# Patient Record
Sex: Female | Born: 1950 | Race: Black or African American | Hispanic: No | Marital: Married | Smoking: Former smoker
Health system: Southern US, Community
[De-identification: ages and names within clinical notes are randomized; demographics above are authoritative.]

## PROBLEM LIST (undated history)

## (undated) DIAGNOSIS — I480 Paroxysmal atrial fibrillation: Secondary | ICD-10-CM

## (undated) DIAGNOSIS — E538 Deficiency of other specified B group vitamins: Secondary | ICD-10-CM

## (undated) DIAGNOSIS — D649 Anemia, unspecified: Secondary | ICD-10-CM

## (undated) DIAGNOSIS — I771 Stricture of artery: Secondary | ICD-10-CM

## (undated) DIAGNOSIS — I2699 Other pulmonary embolism without acute cor pulmonale: Secondary | ICD-10-CM

## (undated) DIAGNOSIS — E039 Hypothyroidism, unspecified: Secondary | ICD-10-CM

## (undated) DIAGNOSIS — I10 Essential (primary) hypertension: Secondary | ICD-10-CM

## (undated) DIAGNOSIS — R7303 Prediabetes: Secondary | ICD-10-CM

## (undated) DIAGNOSIS — E785 Hyperlipidemia, unspecified: Secondary | ICD-10-CM

## (undated) DIAGNOSIS — I2510 Atherosclerotic heart disease of native coronary artery without angina pectoris: Secondary | ICD-10-CM

## (undated) DIAGNOSIS — I779 Disorder of arteries and arterioles, unspecified: Secondary | ICD-10-CM

## (undated) HISTORY — DX: Hyperlipidemia, unspecified: E78.5

## (undated) HISTORY — PX: SHOULDER SURGERY: SHX246

## (undated) HISTORY — DX: Deficiency of other specified B group vitamins: E53.8

## (undated) HISTORY — DX: Other pulmonary embolism without acute cor pulmonale: I26.99

## (undated) HISTORY — DX: Prediabetes: R73.03

## (undated) HISTORY — DX: Disorder of arteries and arterioles, unspecified: I77.9

## (undated) HISTORY — PX: ABDOMINAL HYSTERECTOMY: SHX81

## (undated) HISTORY — DX: Anemia, unspecified: D64.9

## (undated) HISTORY — DX: Essential (primary) hypertension: I10

## (undated) HISTORY — DX: Stricture of artery: I77.1

## (undated) HISTORY — DX: Atherosclerotic heart disease of native coronary artery without angina pectoris: I25.10

## (undated) HISTORY — DX: Hypothyroidism, unspecified: E03.9

## (undated) HISTORY — DX: Paroxysmal atrial fibrillation: I48.0

## (undated) HISTORY — PX: TONSILLECTOMY: SUR1361

## (undated) HISTORY — PX: TUBAL LIGATION: SHX77

---

## 1998-10-28 ENCOUNTER — Ambulatory Visit (HOSPITAL_COMMUNITY): Admission: RE | Admit: 1998-10-28 | Discharge: 1998-10-28 | Payer: Self-pay | Admitting: Orthopedic Surgery

## 1998-10-28 ENCOUNTER — Encounter: Payer: Self-pay | Admitting: Orthopedic Surgery

## 1998-11-17 ENCOUNTER — Encounter: Payer: Self-pay | Admitting: Orthopedic Surgery

## 1998-11-18 ENCOUNTER — Observation Stay (HOSPITAL_COMMUNITY): Admission: RE | Admit: 1998-11-18 | Discharge: 1998-11-19 | Payer: Self-pay | Admitting: Orthopedic Surgery

## 1999-03-10 ENCOUNTER — Ambulatory Visit (HOSPITAL_COMMUNITY): Admission: RE | Admit: 1999-03-10 | Discharge: 1999-03-10 | Payer: Self-pay | Admitting: Gastroenterology

## 1999-12-01 ENCOUNTER — Other Ambulatory Visit: Admission: RE | Admit: 1999-12-01 | Discharge: 1999-12-01 | Payer: Self-pay | Admitting: *Deleted

## 1999-12-23 ENCOUNTER — Emergency Department (HOSPITAL_COMMUNITY): Admission: EM | Admit: 1999-12-23 | Discharge: 1999-12-23 | Payer: Self-pay | Admitting: *Deleted

## 2001-01-30 ENCOUNTER — Encounter: Admission: RE | Admit: 2001-01-30 | Discharge: 2001-01-30 | Payer: Self-pay | Admitting: Family Medicine

## 2001-01-30 ENCOUNTER — Encounter: Payer: Self-pay | Admitting: Family Medicine

## 2001-04-05 ENCOUNTER — Other Ambulatory Visit: Admission: RE | Admit: 2001-04-05 | Discharge: 2001-04-05 | Payer: Self-pay | Admitting: Obstetrics and Gynecology

## 2001-04-11 ENCOUNTER — Encounter: Admission: RE | Admit: 2001-04-11 | Discharge: 2001-04-11 | Payer: Self-pay | Admitting: Obstetrics and Gynecology

## 2001-04-11 ENCOUNTER — Encounter: Payer: Self-pay | Admitting: Obstetrics and Gynecology

## 2001-09-17 ENCOUNTER — Ambulatory Visit (HOSPITAL_COMMUNITY): Admission: RE | Admit: 2001-09-17 | Discharge: 2001-09-17 | Payer: Self-pay | Admitting: Family Medicine

## 2002-02-24 ENCOUNTER — Encounter: Payer: Self-pay | Admitting: Emergency Medicine

## 2002-02-24 ENCOUNTER — Emergency Department (HOSPITAL_COMMUNITY): Admission: EM | Admit: 2002-02-24 | Discharge: 2002-02-24 | Payer: Self-pay | Admitting: Emergency Medicine

## 2002-03-31 ENCOUNTER — Emergency Department (HOSPITAL_COMMUNITY): Admission: EM | Admit: 2002-03-31 | Discharge: 2002-03-31 | Payer: Self-pay | Admitting: Emergency Medicine

## 2002-07-24 ENCOUNTER — Encounter: Payer: Self-pay | Admitting: Family Medicine

## 2002-07-24 ENCOUNTER — Encounter: Admission: RE | Admit: 2002-07-24 | Discharge: 2002-07-24 | Payer: Self-pay | Admitting: Family Medicine

## 2002-10-16 ENCOUNTER — Encounter: Payer: Self-pay | Admitting: Orthopedic Surgery

## 2002-10-16 ENCOUNTER — Ambulatory Visit (HOSPITAL_COMMUNITY): Admission: RE | Admit: 2002-10-16 | Discharge: 2002-10-16 | Payer: Self-pay | Admitting: Orthopedic Surgery

## 2003-11-10 ENCOUNTER — Other Ambulatory Visit: Admission: RE | Admit: 2003-11-10 | Discharge: 2003-11-10 | Payer: Self-pay | Admitting: Obstetrics & Gynecology

## 2003-12-08 ENCOUNTER — Encounter: Admission: RE | Admit: 2003-12-08 | Discharge: 2003-12-08 | Payer: Self-pay | Admitting: Obstetrics and Gynecology

## 2004-10-01 ENCOUNTER — Ambulatory Visit: Payer: Self-pay | Admitting: Oncology

## 2010-11-02 ENCOUNTER — Other Ambulatory Visit: Payer: Self-pay | Admitting: Family Medicine

## 2010-11-02 DIAGNOSIS — M25561 Pain in right knee: Secondary | ICD-10-CM

## 2010-11-05 ENCOUNTER — Ambulatory Visit
Admission: RE | Admit: 2010-11-05 | Discharge: 2010-11-05 | Disposition: A | Discharge status: Home / Self Care | Payer: 59 | Source: Ambulatory Visit | Attending: Family Medicine | Admitting: Family Medicine

## 2010-11-05 DIAGNOSIS — M25561 Pain in right knee: Secondary | ICD-10-CM

## 2012-05-30 ENCOUNTER — Encounter: Payer: Self-pay | Admitting: Emergency Medicine

## 2012-07-03 ENCOUNTER — Other Ambulatory Visit: Payer: Self-pay | Admitting: Emergency Medicine

## 2012-07-03 NOTE — Telephone Encounter (Signed)
Please pull chart.

## 2012-07-11 ENCOUNTER — Telehealth: Payer: Self-pay

## 2012-07-11 NOTE — Telephone Encounter (Signed)
Pt states that she would like a refill on levothyroxine, pt states that she is completely out of this medication and Dr.Ganji told her to call us to refill this rx. Best# [phone]

## 2012-07-11 NOTE — Telephone Encounter (Signed)
She needs to have pharmacy send [place] the request, she needs appt. Can send in 1 mo supply until she can follow up. She is contacting the pharmacy now, so they can send to [place]. Marland Kitchen

## 2012-07-12 MED ORDER — LEVOTHYROXINE SODIUM 100 MCG PO TABS: 100.0000 ug | ORAL_TABLET | Freq: Every day | ORAL | Status: DC

## 2012-07-12 NOTE — Telephone Encounter (Signed)
I have not heard back from her pharmacy so I called it in, at previous dose given.

## 2012-08-12 ENCOUNTER — Ambulatory Visit (INDEPENDENT_AMBULATORY_CARE_PROVIDER_SITE_OTHER): Payer: 59 | Admitting: Family Medicine

## 2012-08-12 VITALS — BP 172/108 | HR 86 | Temp 98.9°F | Resp 20 | Ht 61.0 in | Wt 240.2 lb

## 2012-08-12 DIAGNOSIS — E889 Metabolic disorder, unspecified: Secondary | ICD-10-CM

## 2012-08-12 DIAGNOSIS — R21 Rash and other nonspecific skin eruption: Secondary | ICD-10-CM

## 2012-08-12 DIAGNOSIS — E039 Hypothyroidism, unspecified: Secondary | ICD-10-CM

## 2012-08-12 DIAGNOSIS — I10 Essential (primary) hypertension: Secondary | ICD-10-CM

## 2012-08-12 MED ORDER — TRIAMCINOLONE ACETONIDE 0.1 % EX CREA: TOPICAL_CREAM | Freq: Two times a day (BID) | CUTANEOUS | Status: DC

## 2012-08-12 MED ORDER — LEVOTHYROXINE SODIUM 100 MCG PO TABS: 100.0000 ug | ORAL_TABLET | Freq: Every day | ORAL | Status: DC

## 2012-08-12 MED ORDER — LABETALOL HCL 200 MG PO TABS: 200.0000 mg | ORAL_TABLET | Freq: Every day | ORAL | Status: DC

## 2012-08-12 MED ORDER — OLMESARTAN-AMLODIPINE-HCTZ 40-10-25 MG PO TABS: 1.0000 | ORAL_TABLET | Freq: Every day | ORAL | Status: DC

## 2012-08-12 NOTE — Progress Notes (Signed)
[facility]:  Office Visit  Chief Complaint:  Chief Complaint  Patient presents with  . Cyst    back of neck x 1 month  . Rash    R arm  . Recurrent Skin Infections    R breast x 1 month  . Medication Refill    HPI: Anna Bernard is a 61 y.o. female who complains of  1. HTN-no SEs, compliant but ran out f her Tribenzor 4 days ago. Nonsymptomatic, no HA, No vision changes. 2. Rash on left arm- 3 month duration, tried Hydrocortisone and has decrease in size , non itching, getting better, no new meds, no chemical exposure 3. She has a lesion on back and also on breast that has been there for several months. Thinks an insect bit her on her neck and then she had a cyst which she picked at. Now has slighlty hard/nontender nodules at those sites. She denies having a h/o keloids.   She is supposed to get labs for Dr. Jacinto Halim on Dec 9th.    Past Medical History  Diagnosis Date  . Hypertension   . Thyroid disease    Past Surgical History  Procedure Date  . Abdominal hysterectomy    History   Social History  . Marital Status: Single    Spouse Name: N/A    Number of Children: N/A  . Years of Education: N/A   Social History Main Topics  . Smoking status: Never Smoker   . Smokeless tobacco: None  . Alcohol Use: None  . Drug Use: None  . Sexually Active: None   Other Topics Concern  . None   Social History Narrative  . None   No family history on file. No Known Allergies Prior to Admission medications   Medication Sig Start Date End Date Taking? Authorizing Provider  Fiber-Vitamins-Minerals (FIBERALL) POWD Take 2 scoop by mouth daily.   Yes Historical Provider, MD  labetalol (NORMODYNE) 200 MG tablet Take 200 mg by mouth daily.   Yes Historical Provider, MD  levothyroxine (SYNTHROID, LEVOTHROID) 100 MCG tablet Take 1 tablet (100 mcg total) by mouth daily. 07/12/12  Yes Heather Jaquita Rector, PA-C  Multiple Vitamin (MULTIVITAMIN WITH MINERALS) TABS Take 1  tablet by mouth daily.   Yes Historical Provider, MD  Olmesartan-Amlodipine-HCTZ (TRIBENZOR) 40-10-25 MG TABS Take by mouth daily.   Yes Historical Provider, MD     ROS: The patient denies fevers, chills, night sweats, unintentional weight loss, chest pain, palpitations, wheezing, dyspnea on exertion, nausea, vomiting, abdominal pain, dysuria, hematuria, melena, numbness, weakness, or tingling.   All other systems have been reviewed and were otherwise negative with the exception of those mentioned in the HPI and as above.    PHYSICAL EXAM: Filed Vitals:   08/12/12 1227  BP: 172/108  Pulse: 86  Temp: 98.9 F (37.2 C)  Resp: 20   Filed Vitals:   08/12/12 1227  Height: 5\' 1"  (1.549 m)  Weight: 240 lb 3.2 oz (108.954 kg)   Body mass index is 45.39 kg/(m^2).  General: Alert, no acute distress HEENT:  Normocephalic, atraumatic, oropharynx patent.  Cardiovascular:  Regular rate and rhythm, no rubs murmurs or gallops.  No Carotid bruits, radial pulse intact. No pedal edema.  Respiratory: Clear to auscultation bilaterally.  No wheezes, rales, or rhonchi.  No cyanosis, no use of accessory musculature GI: No organomegaly, abdomen is soft and non-tender, positive bowel sounds.  No masses. Skin:  + right arm rash hyperpigmented, nonpruritic 5-10 mm  maculopapular lesions on forearm Neurologic: Facial musculature symmetric. Psychiatric: Patient is appropriate throughout our interaction. Lymphatic: No cervical lymphadenopathy Musculoskeletal: Gait intact.   LABS: No results found for this or any previous visit.   EKG/XRAY:   Primary read interpreted by Dr. Conley Rolls at [facility].   ASSESSMENT/PLAN: Encounter Diagnoses  Name Primary?  . HTN (hypertension) Yes  . Hypothyroid   . Rash    Will refill meds x 6 months, Patient advise to take BP after taking meds, if her BP is consistenly above 140/90 then need f/u sooner. Advise to take medicine as soon as possible. She is currently asymptomatic,  is driving herself so I will not give her any clonodine.  She will get BMP and TSH when she goes to [facility] to get labs for Dr. Jacinto Halim on Dec 9th, gave her a rx Rash on neck and breast most likely skin changes from injury  ( granulation, fiber remodeling and contraction), she has had breast mammogram normal Rash on hand ? allergic dermatitis vs pityriasis rosea but not on back, improving, will give triamcinolone    Marnisha Stampley PHUONG, DO 08/12/2012 1:24 PM

## 2012-08-27 LAB — BASIC METABOLIC PANEL
BUN: 12 mg/dL (ref 4–21)
Creatinine: 0.6 mg/dL (ref 0.5–1.1)
Glucose: 133 mg/dL
Potassium: 4 mmol/L (ref 3.4–5.3)
Sodium: 139 mmol/L (ref 137–147)

## 2012-08-27 LAB — TSH: TSH: 2.32 u[IU]/mL (ref 0.41–5.90)

## 2012-12-08 ENCOUNTER — Encounter: Payer: Self-pay | Admitting: *Deleted

## 2013-02-16 ENCOUNTER — Other Ambulatory Visit: Payer: Self-pay | Admitting: Family Medicine

## 2013-02-18 NOTE — Telephone Encounter (Signed)
Due for OV, labs 

## 2013-04-21 ENCOUNTER — Ambulatory Visit (INDEPENDENT_AMBULATORY_CARE_PROVIDER_SITE_OTHER): Payer: 59 | Admitting: Family Medicine

## 2013-04-21 VITALS — BP 142/82 | HR 84 | Temp 98.1°F | Resp 16 | Ht 61.0 in | Wt 240.0 lb

## 2013-04-21 DIAGNOSIS — K047 Periapical abscess without sinus: Secondary | ICD-10-CM

## 2013-04-21 DIAGNOSIS — I10 Essential (primary) hypertension: Secondary | ICD-10-CM

## 2013-04-21 DIAGNOSIS — E785 Hyperlipidemia, unspecified: Secondary | ICD-10-CM

## 2013-04-21 DIAGNOSIS — E039 Hypothyroidism, unspecified: Secondary | ICD-10-CM

## 2013-04-21 MED ORDER — ROSUVASTATIN CALCIUM 10 MG PO TABS: 10.0000 mg | ORAL_TABLET | Freq: Every day | ORAL | Status: DC

## 2013-04-21 MED ORDER — AMOXICILLIN 500 MG PO TABS: 500.0000 mg | ORAL_TABLET | Freq: Three times a day (TID) | ORAL | Status: DC

## 2013-04-21 MED ORDER — LEVOTHYROXINE SODIUM 100 MCG PO TABS: 100.0000 ug | ORAL_TABLET | Freq: Every day | ORAL | Status: DC

## 2013-04-21 MED ORDER — OLMESARTAN-AMLODIPINE-HCTZ 40-10-25 MG PO TABS: 1.0000 | ORAL_TABLET | Freq: Every day | ORAL | Status: DC

## 2013-04-21 NOTE — Progress Notes (Signed)
[facility]:  Office Visit  Chief Complaint:  Chief Complaint  Patient presents with  . Medication Refill  . Mouth Lesions    since yesterday    HPI: Anna Bernard is a 62 y.o. female who complains of :  1. She is here for HTN recheck. She is supposed to be on Labetalol and also TriBenzor  But the labetalol makes her dizzy so she stopped. She told Dr. Jacinto Halim who is her cardiologist this. Her BP at home is 150-160/90 and it is high. Since she stopped the labetalol she has not had any SEs 2. She is also taking her thyroid medicine byitself and 2 hrs before any other meds, on empty stomach with just water. Denies dry depression, dry skin, hair changes.  3. She also has had left jaw and mouth pain on Monday and then last night it flared up. She took Aleve, ambusol around gums and helped some. She is trying to see a dentist as soon as possible. Denies F/c/n/v, has not eaten due to pain 4. She takes her crestor every other day due to msk ache,  able to tolerate it but she does not like going to work with it.   She takes her asa 79  She does take Aleve BID  Past Medical History  Diagnosis Date  . Hypertension   . Thyroid disease    Past Surgical History  Procedure Laterality Date  . Abdominal hysterectomy     History   Social History  . Marital Status: Single    Spouse Name: N/A    Number of Children: N/A  . Years of Education: N/A   Social History Main Topics  . Smoking status: Never Smoker   . Smokeless tobacco: None  . Alcohol Use: None  . Drug Use: None  . Sexually Active: None   Other Topics Concern  . None   Social History Narrative  . None   No family history on file. No Known Allergies Prior to Admission medications   Medication Sig Start Date End Date Taking? Authorizing Provider  aspirin 81 MG tablet Take 81 mg by mouth daily.   Yes Historical Provider, MD  Fiber-Vitamins-Minerals (FIBERALL) POWD Take 2 scoop by mouth daily.   Yes  Historical Provider, MD  levothyroxine (SYNTHROID, LEVOTHROID) 100 MCG tablet Take 1 tablet (100 mcg total) by mouth daily before breakfast. NEED VISIT, LABS! 02/16/13  Yes Godfrey Pick, PA-C  Multiple Vitamin (MULTIVITAMIN WITH MINERALS) TABS Take 1 tablet by mouth daily.   Yes Historical Provider, MD  Olmesartan-Amlodipine-HCTZ (TRIBENZOR) 40-10-25 MG TABS Take 1 tablet by mouth daily. 08/12/12  Yes Thao P Le, DO  rosuvastatin (CRESTOR) 10 MG tablet Take 10 mg by mouth daily.   Yes Historical Provider, MD  triamcinolone cream (KENALOG) 0.1 % Apply topically 2 (two) times daily. 08/12/12  Yes Thao P Le, DO  labetalol (NORMODYNE) 200 MG tablet Take 1 tablet (200 mg total) by mouth daily. 08/12/12   Thao P Le, DO     ROS: The patient denies fevers, chills, night sweats, unintentional weight loss, chest pain, palpitations, wheezing, dyspnea on exertion, nausea, vomiting, abdominal pain, dysuria, hematuria, melena, numbness, weakness, or tingling.   All other systems have been reviewed and were otherwise negative with the exception of those mentioned in the HPI and as above.    PHYSICAL EXAM: Filed Vitals:   04/21/13 1430  BP: 142/82  Pulse: 84  Temp: 98.1 F (36.7 C)  Resp: 16  Filed Vitals:   04/21/13 1430  Height: 5\' 1"  (1.549 m)  Weight: 240 lb (108.863 kg)   Body mass index is 45.37 kg/(m^2).  General: Alert, no acute distress HEENT:  Normocephalic, atraumatic, oropharynx patent. + mild left facial/llower jaw  Swelling + erythematous gingiva and dental cavity Cardiovascular:  Regular rate and rhythm, no rubs murmurs or gallops.  No Carotid bruits, radial pulse intact. No pedal edema.  Respiratory: Clear to auscultation bilaterally.  No wheezes, rales, or rhonchi.  No cyanosis, no use of accessory musculature GI: No organomegaly, abdomen is soft and non-tender, positive bowel sounds.  No masses. Skin: No rashes. Neurologic: Facial musculature symmetric. Psychiatric: Patient  is appropriate throughout our interaction. Lymphatic: No cervical lymphadenopathy Musculoskeletal: Gait intact.   LABS: Results for orders placed in visit on 12/08/12  BASIC METABOLIC PANEL      Result Value Range   Glucose 133     BUN 12  4 - 21 mg/dL   Creatinine 0.6  0.5 - 1.1 mg/dL   Potassium 4.0  3.4 - 5.3 mmol/L   Sodium 139  137 - 147 mmol/L  TSH      Result Value Range   TSH 2.32  0.41 - 5.90 uIU/mL     EKG/XRAY:   Primary read interpreted by [facility]. Conley Rolls at [facility].   ASSESSMENT/PLAN: Encounter Diagnoses  Name Primary?  . HTN (hypertension) Yes  . Hyperlipidemia   . Hypothyroid   . Tooth abscess    Refilled chronic meds RX amoxacillin for tooth abscess until she is able to see dentist F/u labs and also send labs to Dr. Jacinto Halim She is semicompliant with HTN and Hyperlipidemia meds Recommend fish oil otc if lipid labs are abnormal F/u in 5 1/2 months    LE, [name], DO 04/21/2013 4:08 PM

## 2013-04-22 LAB — MICROALBUMIN / CREATININE URINE RATIO
Creatinine, Urine: 58.9 mg/dL
Microalb Creat Ratio: 84.6 mg/g — ABNORMAL HIGH (ref 0.0–30.0)
Microalb, Ur: 4.98 mg/dL — ABNORMAL HIGH (ref 0.00–1.89)

## 2013-04-23 LAB — LIPID PANEL
Cholesterol: 179 mg/dL (ref 0–200)
HDL: 58 mg/dL (ref >39)
LDL Cholesterol: 95 mg/dL (ref 0–99)
Total CHOL/HDL Ratio: 3.1 Ratio
Triglycerides: 130 mg/dL (ref <150)
VLDL: 26 mg/dL (ref 0–40)

## 2013-04-23 LAB — COMPREHENSIVE METABOLIC PANEL
ALT: 20 U/L (ref 0–35)
AST: 22 U/L (ref 0–37)
Albumin: 4.3 g/dL (ref 3.5–5.2)
Alkaline Phosphatase: 88 U/L (ref 39–117)
BUN: 14 mg/dL (ref 6–23)
CO2: 28 mEq/L (ref 19–32)
Calcium: 9.4 mg/dL (ref 8.4–10.5)
Chloride: 102 mEq/L (ref 96–112)
Creat: 0.52 mg/dL (ref 0.50–1.10)
Glucose, Bld: 109 mg/dL — ABNORMAL HIGH (ref 70–99)
Potassium: 3.9 mEq/L (ref 3.5–5.3)
Sodium: 140 mEq/L (ref 135–145)
Total Bilirubin: 0.4 mg/dL (ref 0.3–1.2)
Total Protein: 7.7 g/dL (ref 6.0–8.3)

## 2013-04-23 LAB — TSH: TSH: 2.961 u[IU]/mL (ref 0.350–4.500)

## 2013-08-18 ENCOUNTER — Ambulatory Visit (INDEPENDENT_AMBULATORY_CARE_PROVIDER_SITE_OTHER): Payer: 59 | Admitting: Internal Medicine

## 2013-08-18 VITALS — BP 130/70 | HR 92 | Temp 98.2°F | Resp 20 | Ht 61.0 in | Wt 234.4 lb

## 2013-08-18 DIAGNOSIS — K047 Periapical abscess without sinus: Secondary | ICD-10-CM

## 2013-08-18 MED ORDER — AMOXICILLIN 500 MG PO CAPS: 1000.0000 mg | ORAL_CAPSULE | Freq: Two times a day (BID) | ORAL | Status: AC

## 2013-08-18 MED ORDER — HYDROCODONE-ACETAMINOPHEN 5-325 MG PO TABS: 1.0000 | ORAL_TABLET | Freq: Four times a day (QID) | ORAL | Status: DC | PRN

## 2013-08-18 NOTE — Progress Notes (Signed)
Subjective:    Patient ID: Anna Bernard, female    DOB: June 22, 1951, 62 y.o.   MRN: [id number]  This chart was scribed for Anna Sia, MD by Greggory Stallion, Medical Scribe. This patient's care was started at 2:57 PM.  HPI HPI Comments: [name] is a 62 y.o. female who presents to the office complaining of gradual onset, constant left lower dental pain due to an abscess. She states the abscess is recurrent. Pt is going to call a dentist tomorrow to set up an appointment. Denies fever.   There are no active problems to display for this patient.  Past Medical History  Diagnosis Date  . Hypertension   . Thyroid disease    Past Surgical History  Procedure Laterality Date  . Abdominal hysterectomy    . Shoulder surgery     No Known Allergies Prior to Admission medications   Medication Sig Start Date End Date Taking? Authorizing Provider  aspirin 81 MG tablet Take 81 mg by mouth daily.   Yes Historical Provider, MD  Fiber-Vitamins-Minerals (FIBERALL) POWD Take 2 scoop by mouth daily.   Yes Historical Provider, MD  levothyroxine (SYNTHROID, LEVOTHROID) 100 MCG tablet Take 1 tablet (100 mcg total) by mouth daily before breakfast. 04/21/13  Yes Thao P Le, DO  Multiple Vitamin (MULTIVITAMIN WITH MINERALS) TABS Take 1 tablet by mouth daily.   Yes Historical Provider, MD  Olmesartan-Amlodipine-HCTZ (TRIBENZOR) 40-10-25 MG TABS Take 1 tablet by mouth daily. 04/21/13  Yes Thao P Le, DO  rosuvastatin (CRESTOR) 10 MG tablet Take 1 tablet (10 mg total) by mouth daily. 04/21/13  Yes Thao P Le, DO  triamcinolone cream (KENALOG) 0.1 % Apply topically 2 (two) times daily. 08/12/12  Yes Thao P Le, DO  labetalol (NORMODYNE) 200 MG tablet Take 1 tablet (200 mg total) by mouth daily. 08/12/12   Thao P Le, DO   History   Social History  . Marital Status: Single    Spouse Name: N/A    Number of Children: N/A  . Years of Education: N/A   Occupational History  . Not on file.   Social History  Main Topics  . Smoking status: Never Smoker   . Smokeless tobacco: Not on file  . Alcohol Use: No  . Drug Use: No  . Sexual Activity: Not on file   Other Topics Concern  . Not on file   Social History Narrative  . No narrative on file   Review of Systems  Constitutional: Negative for fever.  HENT: Positive for dental problem.        Objective:   Physical Exam  Vitals reviewed. Constitutional: She is oriented to person, place, and time. She appears well-developed and well-nourished. No distress.  HENT:  Head: Normocephalic and atraumatic.  Inside base of left lower second molar has an abscess that is tender to touch.   Eyes: EOM are normal.  Neck: Neck supple.  Cardiovascular: Normal rate.   Pulmonary/Chest: Effort normal. No respiratory distress.  Musculoskeletal: Normal range of motion.  Neurological: She is alert and oriented to person, place, and time.  Skin: Skin is warm and dry.  Psychiatric: She has a normal mood and affect. Her behavior is normal.    Filed Vitals:   08/18/13 1423  BP: 130/70  Pulse: 92  Temp: 98.2 F (36.8 C)  TempSrc: Oral  Resp: 20  Height: 5\' 1"  (1.549 m)  Weight: 234 lb 6.4 oz (106.323 kg)  SpO2: 95%  Assessment & Plan:  2:58 PM-Discussed treatment plan which includes pain medication and an antibiotic with pt at bedside and pt agreed to plan.    I have completed the patient encounter in its entirety as documented by the scribe, with editing by me where necessary. Shaelin Lalley P. Merla Riches, M.D. Dental abscess  Meds ordered this encounter  Medications  . amoxicillin (AMOXIL) 500 MG capsule    Sig: Take 2 capsules (1,000 mg total) by mouth 2 (two) times daily.    Dispense:  40 capsule    Refill:  0  . HYDROcodone-acetaminophen (NORCO/VICODIN) 5-325 MG per tablet    Sig: Take 1 tablet by mouth every 6 (six) hours as needed for moderate pain.    Dispense:  15 tablet    Refill:  0   F/u w/ dentist

## 2013-11-15 ENCOUNTER — Other Ambulatory Visit: Payer: Self-pay | Admitting: Family Medicine

## 2013-11-16 ENCOUNTER — Ambulatory Visit (INDEPENDENT_AMBULATORY_CARE_PROVIDER_SITE_OTHER): Payer: 59 | Admitting: Emergency Medicine

## 2013-11-16 VITALS — BP 128/78 | HR 85 | Temp 98.6°F | Resp 16 | Ht 62.5 in | Wt 243.6 lb

## 2013-11-16 DIAGNOSIS — E782 Mixed hyperlipidemia: Secondary | ICD-10-CM

## 2013-11-16 DIAGNOSIS — I10 Essential (primary) hypertension: Secondary | ICD-10-CM

## 2013-11-16 DIAGNOSIS — E119 Type 2 diabetes mellitus without complications: Secondary | ICD-10-CM

## 2013-11-16 DIAGNOSIS — E039 Hypothyroidism, unspecified: Secondary | ICD-10-CM

## 2013-11-16 DIAGNOSIS — E785 Hyperlipidemia, unspecified: Secondary | ICD-10-CM

## 2013-11-16 MED ORDER — OLMESARTAN-AMLODIPINE-HCTZ 40-10-25 MG PO TABS: 1.0000 | ORAL_TABLET | Freq: Every day | ORAL | Status: DC

## 2013-11-16 MED ORDER — SPIRONOLACTONE 25 MG PO TABS: 25.0000 mg | ORAL_TABLET | Freq: Every day | ORAL | Status: DC

## 2013-11-16 MED ORDER — ROSUVASTATIN CALCIUM 10 MG PO TABS: 10.0000 mg | ORAL_TABLET | Freq: Every day | ORAL | Status: DC

## 2013-11-16 MED ORDER — LEVOTHYROXINE SODIUM 100 MCG PO TABS: 100.0000 ug | ORAL_TABLET | Freq: Every day | ORAL | Status: DC

## 2013-11-16 NOTE — Progress Notes (Signed)
[facility] [address], [place] [address] [phone]  Date:  11/16/2013   Name:  Anna Bernard   DOB:  11/19/1950   MRN:  [id number]  PCP:  Tally DueGUEST, CHRIS WARREN, MD    Chief Complaint: Medication Refill   History of Present Illness:  Anna Bernard is a 63 y.o. very pleasant female patient who presents with the following:  Tolerating medication well with no adverse side effects.  Last labs 6 months ago.  Needs refills.  Denies other complaint or health concern today.   There are no active problems to display for this patient.   Past Medical History  Diagnosis Date  . Hypertension   . Thyroid disease     Past Surgical History  Procedure Laterality Date  . Abdominal hysterectomy    . Shoulder surgery      History  Substance Use Topics  . Smoking status: Never Smoker   . Smokeless tobacco: Not on file  . Alcohol Use: No    History reviewed. No pertinent family history.  No Known Allergies  Medication list has been reviewed and updated.  Current Outpatient Prescriptions on File Prior to Visit  Medication Sig Dispense Refill  . aspirin 81 MG tablet Take 81 mg by mouth daily.      . Fiber-Vitamins-Minerals (FIBERALL) POWD Take 2 scoop by mouth daily.      Marland Kitchen. levothyroxine (SYNTHROID, LEVOTHROID) 100 MCG tablet Take 1 tablet (100 mcg total) by mouth daily before breakfast.  90 tablet  1  . Multiple Vitamin (MULTIVITAMIN WITH MINERALS) TABS Take 1 tablet by mouth daily.      . Olmesartan-Amlodipine-HCTZ (TRIBENZOR) 40-10-25 MG TABS Take 1 tablet by mouth daily.  90 tablet  1  . rosuvastatin (CRESTOR) 10 MG tablet Take 1 tablet (10 mg total) by mouth daily.  90 tablet  1  . triamcinolone cream (KENALOG) 0.1 % Apply topically 2 (two) times daily.  60 g  1  . HYDROcodone-acetaminophen (NORCO/VICODIN) 5-325 MG per tablet Take 1 tablet by mouth every 6 (six) hours as needed for moderate pain.  15 tablet  0  . labetalol (NORMODYNE) 200 MG tablet  Take 1 tablet (200 mg total) by mouth daily.  90 tablet  1   No current facility-administered medications on file prior to visit.    Review of Systems:  As per HPI, otherwise negative.    Physical Examination: Filed Vitals:   11/16/13 1637  BP: 128/78  Pulse: 85  Temp: 98.6 F (37 C)  Resp: 16   Filed Vitals:   11/16/13 1637  Height: 5' 2.5" (1.588 m)  Weight: 243 lb 9.6 oz (110.496 kg)   Body mass index is 43.82 kg/(m^2). Ideal Body Weight: Weight in (lb) to have BMI = 25: 138.6  GEN: obese, NAD, Non-toxic, A & O x 3 HEENT: Atraumatic, Normocephalic. Neck supple. No masses, No LAD. Ears and Nose: No external deformity. CV: RRR, No M/G/R. No JVD. No thrill. No extra heart sounds. PULM: CTA B, no wheezes, crackles, rhonchi. No retractions. No resp. distress. No accessory muscle use. ABD: S, NT, ND, +BS. No rebound. No HSM. EXTR: No c/c/e NEURO Normal gait.  PSYCH: Normally interactive. Conversant. Not depressed or anxious appearing.  Calm demeanor.    Assessment and Plan: NIDDM HBP Hypothyroid Refill meds Labs next visit  Signed,  Phillips OdorJeffery Amery Vandenbos, MD

## 2013-11-16 NOTE — Patient Instructions (Signed)

## 2014-02-21 ENCOUNTER — Other Ambulatory Visit: Payer: Self-pay | Admitting: Emergency Medicine

## 2014-05-08 ENCOUNTER — Other Ambulatory Visit: Payer: Self-pay | Admitting: Emergency Medicine

## 2014-05-15 ENCOUNTER — Other Ambulatory Visit: Payer: Self-pay | Admitting: Emergency Medicine

## 2014-05-16 ENCOUNTER — Other Ambulatory Visit: Payer: Self-pay | Admitting: Emergency Medicine

## 2014-07-06 ENCOUNTER — Ambulatory Visit (INDEPENDENT_AMBULATORY_CARE_PROVIDER_SITE_OTHER): Payer: 59 | Admitting: Physician Assistant

## 2014-07-06 VITALS — BP 116/64 | HR 90 | Temp 98.5°F | Resp 16 | Ht 61.0 in | Wt 227.4 lb

## 2014-07-06 DIAGNOSIS — I10 Essential (primary) hypertension: Secondary | ICD-10-CM | POA: Insufficient documentation

## 2014-07-06 DIAGNOSIS — E079 Disorder of thyroid, unspecified: Secondary | ICD-10-CM | POA: Insufficient documentation

## 2014-07-06 DIAGNOSIS — E039 Hypothyroidism, unspecified: Secondary | ICD-10-CM

## 2014-07-06 LAB — THYROID PANEL WITH TSH
Free Thyroxine Index: 2.1 (ref 1.4–3.8)
T3 Uptake: 26 % (ref 22.0–35.0)
T4, Total: 8.2 ug/dL (ref 4.5–12.0)
TSH: 2.43 u[IU]/mL (ref 0.350–4.500)

## 2014-07-06 MED ORDER — LEVOTHYROXINE SODIUM 100 MCG PO TABS: 100.0000 ug | ORAL_TABLET | Freq: Every day | ORAL | Status: DC

## 2014-07-06 NOTE — Progress Notes (Signed)
I have discussed this patient with Mr. Mani, PA-C and agree.  

## 2014-07-06 NOTE — Progress Notes (Signed)
Subjective:    Patient ID: Jan FiremanMary A Cordoba, female    DOB: 11/09/1950, 63 y.o.   MRN: [id number]  HPI  Mrs. Mickle MalloryDonnell is a 63 y.o. female with pmh of hypothyroidism presenting for refill on her Synthroid. Admits compliance with medication, denies adverse effects. Also denies fatigue, weight gain, dry coarse hair, dry skin, depressed mood, constipation. Of note, patient also has HTN, HL and obesity which she states is being managed by her Cardiologist, Dr. Jacinto HalimGanji. She has obtained refills for all other medications through him. Denies any other questions or concerns.   Prior to Admission medications   Medication Sig Start Date End Date Taking? Authorizing Provider  aspirin 81 MG tablet Take 81 mg by mouth daily.   Yes Historical Provider, MD  Fiber-Vitamins-Minerals (FIBERALL) POWD Take 2 scoop by mouth daily.   Yes Historical Provider, MD  levothyroxine (SYNTHROID, LEVOTHROID) 100 MCG tablet Take 1 tablet (100 mcg total) by mouth daily before breakfast. PATIENT NEEDS OFFICE VISIT FOR ADDITIONAL REFILLS 07/06/14  Yes Wallis BambergMario Estie Sproule, PA-C  Multiple Vitamin (MULTIVITAMIN WITH MINERALS) TABS Take 1 tablet by mouth daily.   Yes Historical Provider, MD  naproxen sodium (ANAPROX) 220 MG tablet Take 220 mg by mouth daily as needed.   Yes Historical Provider, MD  Olmesartan-Amlodipine-HCTZ (TRIBENZOR) 40-10-25 MG TABS Take 1 tablet by mouth daily. 11/16/13  Yes Carmelina DaneJeffery S Anderson, MD  rosuvastatin (CRESTOR) 10 MG tablet Take 1 tablet (10 mg total) by mouth daily. 11/16/13  Yes Carmelina DaneJeffery S Anderson, MD  spironolactone (ALDACTONE) 25 MG tablet Take 1 tablet (25 mg total) by mouth daily. 11/16/13  Yes Carmelina DaneJeffery S Anderson, MD  triamcinolone cream (KENALOG) 0.1 % Apply topically 2 (two) times daily. 08/12/12  Yes Thao P Le, DO  HYDROcodone-acetaminophen (NORCO/VICODIN) 5-325 MG per tablet Take 1 tablet by mouth every 6 (six) hours as needed for moderate pain. 08/18/13   Tonye Pearsonobert P Doolittle, MD  labetalol (NORMODYNE) 200 MG  tablet Take 1 tablet (200 mg total) by mouth daily. 08/12/12   Thao P Le, DO    No Known Allergies   Past Medical History  Diagnosis Date  . Hypertension   . Thyroid disease     Past Surgical History  Procedure Laterality Date  . Abdominal hysterectomy    . Shoulder surgery      Review of Systems As in subjective.    Objective:   Physical Exam  Vitals reviewed. Constitutional: She is oriented to person, place, and time. She appears well-developed and well-nourished. No distress.  BP 116/64  Pulse 90  Temp(Src) 98.5 F (36.9 C) (Oral)  Resp 16  Ht 5\' 1"  (1.549 m)  Wt 227 lb 6.4 oz (103.148 kg)  BMI 42.99 kg/m2  SpO2 97%   HENT:  Nose: Nose normal.  Mouth/Throat: Oropharynx is clear and moist. No oropharyngeal exudate.  Eyes: Conjunctivae are normal. Pupils are equal, round, and reactive to light. No scleral icterus.  Neck: Normal range of motion. Neck supple. No thyromegaly present.  Cardiovascular: Normal rate, regular rhythm, normal heart sounds and intact distal pulses.  Exam reveals no gallop and no friction rub.   No murmur heard. Pulmonary/Chest: Effort normal and breath sounds normal. She has no wheezes.  Abdominal: Soft. Bowel sounds are normal. She exhibits no mass. There is no tenderness.  Musculoskeletal: She exhibits no edema and no tenderness.  Neurological: She is alert and oriented to person, place, and time.  Skin: Skin is warm and dry. She is not diaphoretic.  Psychiatric: She has a normal mood and affect. Her behavior is normal.      Assessment & Plan:   1. Hypothyroidism, unspecified hypothyroidism type Stable, repeat Thyroid panel, continue Synthroid, consider dose changes as necessary pending labs - Thyroid Panel With TSH - levothyroxine (SYNTHROID, LEVOTHROID) 100 MCG tablet; Take 1 tablet (100 mcg total) by mouth daily before breakfast. PATIENT NEEDS OFFICE VISIT FOR ADDITIONAL REFILLS  Dispense: 90 tablet; Refill: 1   Wallis BambergMario Taniela Feltus,  PA-C [facility] [phone] 07/06/2014 9:42 AM

## 2014-07-10 ENCOUNTER — Encounter: Payer: Self-pay | Admitting: Urgent Care

## 2014-11-09 ENCOUNTER — Other Ambulatory Visit: Payer: Self-pay | Admitting: Emergency Medicine

## 2014-12-23 ENCOUNTER — Ambulatory Visit (INDEPENDENT_AMBULATORY_CARE_PROVIDER_SITE_OTHER): Payer: 59 | Admitting: Physician Assistant

## 2014-12-23 VITALS — BP 150/78 | HR 80 | Temp 98.4°F | Resp 20 | Ht 61.0 in | Wt 228.0 lb

## 2014-12-23 DIAGNOSIS — R300 Dysuria: Secondary | ICD-10-CM

## 2014-12-23 DIAGNOSIS — E039 Hypothyroidism, unspecified: Secondary | ICD-10-CM | POA: Diagnosis not present

## 2014-12-23 LAB — POCT UA - MICROSCOPIC ONLY
Bacteria, U Microscopic: NEGATIVE
Casts, Ur, LPF, POC: NEGATIVE
Crystals, Ur, HPF, POC: NEGATIVE
Mucus, UA: NEGATIVE
Yeast, UA: NEGATIVE

## 2014-12-23 LAB — POCT URINALYSIS DIPSTICK
Bilirubin, UA: NEGATIVE
Blood, UA: NEGATIVE
Glucose, UA: NEGATIVE
Ketones, UA: NEGATIVE
Leukocytes, UA: NEGATIVE
Nitrite, UA: NEGATIVE
Protein, UA: NEGATIVE
Spec Grav, UA: 1.015
Urobilinogen, UA: 0.2
pH, UA: 6

## 2014-12-23 MED ORDER — LEVOTHYROXINE SODIUM 100 MCG PO TABS: 100.0000 ug | ORAL_TABLET | Freq: Every day | ORAL | Status: DC

## 2014-12-23 NOTE — Progress Notes (Signed)
12/23/2014 at 12:09 PM  Anna Bernard / DOB: 09/02/1951 / MRN: [id number]  The patient has Thyroid activity decreased; Obesity; and HTN (hypertension) on her problem list.  SUBJECTIVE  Chief complaint: Urinary Tract Infection and Medication Dose Change   History of present illness: Ms. Anna Bernard is 64 y.o. well appearing female presenting for a UTI and possible medication change.  She reports having some dysuria about 2 months ago along with some mild urgency and bladder pressure. She denies frequency and hematuria.  She had some left over Amoxicillin and took roughly 5 doses and has been feeling better.  She has been a non smoker for 42 years and she denies a family history of bladder cancer.  She denies nocturia.   She had a hysterectomy in the 1990's and has not had an menses since. She will not divulge any of her GYN history, and has already discussed these issues with her gyn doctor.  She would like to discuss changing her Thyroid medication from Levothyroxine to Armour.  She read an Q&A article in the paper identifying Levothyroxine as a potential culprit for joint pain.  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    She  has a past medical history of Hypertension and Thyroid disease.    She has a current medication list which includes the following prescription(s): aspirin, fiberall, hydrocodone-acetaminophen, labetalol, levothyroxine, multivitamin with minerals, naproxen sodium, olmesartan-amlodipine-hctz, rosuvastatin, spironolactone, and triamcinolone cream.  Ms. Anna Bernard has No Known Allergies. She  reports that she has never smoked. She does not have any smokeless tobacco history on file. She reports that she does not drink alcohol or use illicit drugs. She  has no sexual activity history on file. The patient  has past surgical history that includes Abdominal hysterectomy and Shoulder surgery.  Her family history is not on file.  Review of Systems  Constitutional:  Negative for fever and chills.  Eyes: Negative.   Respiratory: Negative.   Cardiovascular: Negative.   Gastrointestinal: Negative.   Genitourinary: Positive for dysuria. Negative for urgency, frequency, hematuria and flank pain.  Musculoskeletal: Negative.   Skin: Negative.   Neurological: Negative.  Negative for headaches.    OBJECTIVE  Her  height is 5\' 1"  (1.549 m) and weight is 228 lb (103.42 kg). Her oral temperature is 98.4 F (36.9 C). Her blood pressure is 150/78 and her pulse is 80. Her respiration is 20 and oxygen saturation is 99%.  The patient's body mass index is 43.1 kg/(m^2).  Physical Exam  Constitutional: She is oriented to person, place, and time. She appears well-developed and well-nourished. No distress.  Cardiovascular: Normal rate and regular rhythm.   Respiratory: Effort normal and breath sounds normal.  GI: Soft. Bowel sounds are normal. She exhibits no distension and no mass. There is no tenderness. There is no rigidity, no rebound, no guarding, no CVA tenderness, no tenderness at McBurney's point and negative Murphy's sign.  Neurological: She is alert and oriented to person, place, and time. No cranial nerve deficit.  Skin: Skin is warm and dry. She is not diaphoretic.  Psychiatric: She has a normal mood and affect.    Results for orders placed or performed in visit on 12/23/14 (from the past 24 hour(s))  POCT urinalysis dipstick     Status: None   Collection Time: 12/23/14 12:08 PM  Result Value Ref Range   Color, UA yellow    Clarity, UA hazy    Glucose, UA neg  Bilirubin, UA neg    Ketones, UA neg    Spec Grav, UA 1.015    Blood, UA neg    pH, UA 6.0    Protein, UA neg    Urobilinogen, UA 0.2    Nitrite, UA neg    Leukocytes, UA Negative   POCT UA - Microscopic Only     Status: None   Collection Time: 12/23/14 12:08 PM  Result Value Ref Range   WBC, Ur, HPF, POC 0-1    RBC, urine, microscopic 0*1    Bacteria, U Microscopic neg    Mucus,  UA neg    Epithelial cells, urine per micros 12-17    Crystals, Ur, HPF, POC neg    Casts, Ur, LPF, POC neg    Yeast, UA neg     ASSESSMENT & PLAN  Anna Bernard was seen today for urinary tract infection and medication dose change.  Diagnoses and all orders for this visit:  Hypothyroidism, unspecified hypothyroidism type: Advised patient that we do not change medication or dosages, as her last Thyroid panel in December shows euthyroid.  Explained the function of the thyroid in laymen's terms, as well as the pitfalls of Armour.  Patient agreed to stay at her current dose and to maintain her current medication. Orders: -     levothyroxine (SYNTHROID, LEVOTHROID) 100 MCG tablet; Take 1 tablet (100 mcg total) by mouth daily before breakfast. PATIENT NEEDS OFFICE VISIT FOR ADDITIONAL REFILLS  Dysuria: Urine and PE reassuring. Culture pending. Will treat per results. Patient unwilling to discuss GYN history with me today. I advised that her symptoms could be caused by another etiology, such as atrophic vaginitis, however she felt this was inappropriate.  Orders: -     Urine culture -     POCT urinalysis dipstick -     POCT UA - Microscopic Only    The patient was advised to call or come back to clinic if she does not see an improvement in symptoms, or worsens with the above plan.   Deliah Boston, MHS, PA-C [facility] 12/23/2014 12:09 PM

## 2014-12-24 ENCOUNTER — Encounter: Payer: Self-pay | Admitting: Physician Assistant

## 2014-12-24 DIAGNOSIS — R0989 Other specified symptoms and signs involving the circulatory and respiratory systems: Secondary | ICD-10-CM | POA: Insufficient documentation

## 2014-12-24 DIAGNOSIS — E119 Type 2 diabetes mellitus without complications: Secondary | ICD-10-CM | POA: Insufficient documentation

## 2014-12-24 DIAGNOSIS — E7800 Pure hypercholesterolemia, unspecified: Secondary | ICD-10-CM | POA: Insufficient documentation

## 2014-12-24 LAB — URINE CULTURE: Colony Count: >=100000

## 2015-01-10 ENCOUNTER — Other Ambulatory Visit: Payer: Self-pay | Admitting: Urgent Care

## 2015-02-19 ENCOUNTER — Encounter: Payer: Self-pay | Admitting: *Deleted

## 2015-04-23 ENCOUNTER — Encounter: Payer: Self-pay | Admitting: *Deleted

## 2015-11-21 ENCOUNTER — Other Ambulatory Visit: Payer: Self-pay | Admitting: Physician Assistant

## 2016-01-30 ENCOUNTER — Other Ambulatory Visit: Payer: Self-pay | Admitting: Physician Assistant

## 2016-02-27 ENCOUNTER — Ambulatory Visit (INDEPENDENT_AMBULATORY_CARE_PROVIDER_SITE_OTHER): Payer: 59 | Admitting: Urgent Care

## 2016-02-27 ENCOUNTER — Telehealth: Payer: Self-pay | Admitting: Urgent Care

## 2016-02-27 VITALS — BP 120/76 | HR 94 | Temp 97.5°F | Resp 18 | Ht 61.0 in | Wt 215.0 lb

## 2016-02-27 DIAGNOSIS — E039 Hypothyroidism, unspecified: Secondary | ICD-10-CM | POA: Diagnosis not present

## 2016-02-27 LAB — THYROID PANEL WITH TSH
Free Thyroxine Index: 3.1 (ref 1.4–3.8)
T3 Uptake: 31 % (ref 22–35)
T4, Total: 10.1 ug/dL (ref 4.5–12.0)
TSH: 1.37 mIU/L

## 2016-02-27 NOTE — Patient Instructions (Addendum)
Hypothyroidism Hypothyroidism is a disorder of the thyroid. The thyroid is a large gland that is located in the lower front of the neck. The thyroid releases hormones that control how the body works. With hypothyroidism, the thyroid does not make enough of these hormones. CAUSES Causes of hypothyroidism may include:  Viral infections.  Pregnancy.  Your own defense system (immune system) attacking your thyroid.  Certain medicines.  Birth defects.  Past radiation treatments to your head or neck.  Past treatment with radioactive iodine.  Past surgical removal of part or all of your thyroid.  Problems with the gland that is located in the center of your brain (pituitary). SIGNS AND SYMPTOMS Signs and symptoms of hypothyroidism may include:  Feeling as though you have no energy (lethargy).  Inability to tolerate cold.  Weight gain that is not explained by a change in diet or exercise habits.  Dry skin.  Coarse hair.  Menstrual irregularity.  Slowing of thought processes.  Constipation.  Sadness or depression. DIAGNOSIS  Your health care provider may diagnose hypothyroidism with blood tests and ultrasound tests. TREATMENT Hypothyroidism is treated with medicine that replaces the hormones that your body does not make. After you begin treatment, it may take several weeks for symptoms to go away. HOME CARE INSTRUCTIONS   Take medicines only as directed by your health care provider.  If you start taking any new medicines, tell your health care provider.  Keep all follow-up visits as directed by your health care provider. This is important. As your condition improves, your dosage needs may change. You will need to have blood tests regularly so that your health care provider can watch your condition. SEEK MEDICAL CARE IF:  Your symptoms do not get better with treatment.  You are taking thyroid replacement medicine and:  You sweat excessively.  You have tremors.  You  feel anxious.  You lose weight rapidly.  You cannot tolerate heat.  You have emotional swings.  You have diarrhea.  You feel weak. SEEK IMMEDIATE MEDICAL CARE IF:   You develop chest pain.  You develop an irregular heartbeat.  You develop a rapid heartbeat.   This information is not intended to replace advice given to you by your health care provider. Make sure you discuss any questions you have with your health care provider.   Document Released: 09/05/2005 Document Revised: 09/26/2014 Document Reviewed: 01/21/2014 Elsevier Interactive Patient Education 2016 [employer].     IF you received an x-ray today, you will receive an invoice from [facility]. Please contact [facility] at [phone] with questions or concerns regarding your invoice.   IF you received labwork today, you will receive an invoice from [employer]. Please contact [facility] at [phone] with questions or concerns regarding your invoice.   Our billing staff will not be able to assist you with questions regarding bills from these companies.  You will be contacted with the lab results as soon as they are available. The fastest way to get your results is to activate your My Chart account. Instructions are located on the last page of this paperwork. If you have not heard from [place] regarding the results in 2 weeks, please contact this office.   We recommend that you schedule a mammogram for breast cancer screening. Typically, you do not need a referral to do this. Please contact a local imaging center to schedule your mammogram.  [facility] - [phone]  *ask for the Radiology Department [facility] (  [facility]) - [phone]) [phone] or [phone](336) [phone]  [facility] - [phone] [facility] - [phone]) [phone] [facility] - [phone]  *ask for the Radiology Department [facility] - [phone])  [phone]  *ask for the Radiology Department [facility] - [phone]) [phone]  *ask for the Mammography Department [facility] - [phone]) [phone]

## 2016-02-27 NOTE — Progress Notes (Signed)
    MRN: [id number] DOB: 07/15/1951  Subjective:   Anna Bernard is a 65 y.o. female presenting for follow up on hypothyroidism.   Med refill - Presenting for medication refill of levothyroxine. She has been stable on this dose and has not had any recent medication adjustments. Denies depressed mood, constipation, hair thinning, dry skin. Denies adverse effects from medication as well including insomnia, irritability, heart racing.  Diabetes - Reports that our clinic sent her a letter stating she has diabetes. She is very upset because no one in our clinic has specifically told her about this diagnosis or what to do. On further review, Nurse Mathews Argyleana Bernard sent this patient a letter 04/23/2015 regarding management of her diabetes. She does not want work up for this but wanted to communicate her frustration and confusion with that letter.   Anna Bernard has a current medication list which includes the following prescription(s): aspirin, fiberall, levothyroxine, multivitamin with minerals, olmesartan-amlodipine-hctz, rosuvastatin, spironolactone, triamcinolone cream, hydrocodone-acetaminophen, labetalol, and naproxen sodium. Also has No Known Allergies.  Anna Bernard  has a past medical history of Hypertension and Thyroid disease. Also  has past surgical history that includes Abdominal hysterectomy and Shoulder surgery.  Objective:   Vitals: BP 120/76 mmHg  Pulse 94  Temp(Src) 97.5 F (36.4 C) (Oral)  Resp 18  Ht 5\' 1"  (1.549 m)  Wt 215 lb (97.523 kg)  BMI 40.64 kg/m2  SpO2 97%  Physical Exam  Constitutional: She is oriented to person, place, and time. She appears well-developed and well-nourished.  HENT:  Mouth/Throat: Oropharynx is clear and moist.  Eyes: No scleral icterus.  Neck: Normal range of motion. Neck supple. No thyromegaly present.  Cardiovascular: Normal rate, regular rhythm and intact distal pulses.  Exam reveals no gallop and no friction rub.   No murmur heard. Pulmonary/Chest: No  respiratory distress. She has no wheezes. She has no rales.  Neurological: She is alert and oriented to person, place, and time. She has normal reflexes.  Skin: Skin is warm and dry.   Assessment and Plan :   1. Hypothyroidism, unspecified hypothyroidism type - Thyroid panel pending, will refill medication as appropriate.  2. Diabetes letter - Patient's labs and previous encounters do not support this diagnosis. I believe that this letter was sent by mistake and discussed this with patient. I apologized for this mistake and offered patient work up but she is still upset about this. Follow up as needed.  Wallis BambergMario Barron Vanloan, PA-C [facility] [phone] 02/27/2016 10:28 AM

## 2016-02-27 NOTE — Telephone Encounter (Signed)
Patient called requesting to leave a list of current medications she is taking. Patient was seen today 02/27/16.   List of medications:  Aspirin PRN Fiber-vitamins-Minerals Levothyroxine 100 MCG Olmesartan 40-10-25 MG Rosuvastatin Calcium 10 MG spironolactone 25 mg

## 2016-03-02 ENCOUNTER — Encounter: Payer: Self-pay | Admitting: Urgent Care

## 2016-03-02 ENCOUNTER — Other Ambulatory Visit: Payer: Self-pay | Admitting: Urgent Care

## 2016-03-02 MED ORDER — LEVOTHYROXINE SODIUM 100 MCG PO TABS: 100.0000 ug | ORAL_TABLET | Freq: Every day | ORAL | Status: DC

## 2016-03-02 NOTE — Telephone Encounter (Signed)
I will send a letter out.

## 2016-04-06 ENCOUNTER — Encounter: Payer: Self-pay | Admitting: Emergency Medicine

## 2017-03-24 ENCOUNTER — Other Ambulatory Visit: Payer: Self-pay | Admitting: Urgent Care

## 2017-04-22 ENCOUNTER — Other Ambulatory Visit: Payer: Self-pay | Admitting: Physician Assistant

## 2017-04-22 ENCOUNTER — Encounter: Payer: Self-pay | Admitting: *Deleted

## 2017-04-22 NOTE — Telephone Encounter (Signed)
Mailed letter to schedule appt for additional refills

## 2017-05-23 ENCOUNTER — Ambulatory Visit (INDEPENDENT_AMBULATORY_CARE_PROVIDER_SITE_OTHER): Payer: 59 | Admitting: Emergency Medicine

## 2017-05-23 ENCOUNTER — Encounter: Payer: Self-pay | Admitting: Emergency Medicine

## 2017-05-23 VITALS — BP 103/66 | HR 79 | Temp 98.3°F | Resp 18 | Ht 60.51 in | Wt 199.2 lb

## 2017-05-23 DIAGNOSIS — E119 Type 2 diabetes mellitus without complications: Secondary | ICD-10-CM | POA: Diagnosis not present

## 2017-05-23 DIAGNOSIS — E079 Disorder of thyroid, unspecified: Secondary | ICD-10-CM

## 2017-05-23 DIAGNOSIS — E7800 Pure hypercholesterolemia, unspecified: Secondary | ICD-10-CM

## 2017-05-23 DIAGNOSIS — I10 Essential (primary) hypertension: Secondary | ICD-10-CM

## 2017-05-23 MED ORDER — LEVOTHYROXINE SODIUM 100 MCG PO TABS: 100.0000 ug | ORAL_TABLET | Freq: Every day | ORAL | 3 refills | Status: DC

## 2017-05-23 NOTE — Progress Notes (Signed)
Anna Bernard 66 y.o.   Chief Complaint  Patient presents with  . Medication Refill    Levothyroxine Sodium    HISTORY OF PRESENT ILLNESS: This is a 66 y.o. female with h/o hypothyroidism, ran out of synthroid. No complaints or medical concerns. Blood pressure under control.  HPI   Prior to Admission medications   Medication Sig Start Date End Date Taking? Authorizing Provider  aspirin 81 MG tablet Take 81 mg by mouth daily.   Yes [provider]  Fiber-Vitamins-Minerals (FIBERALL) POWD Take 2 scoop by mouth daily.   Yes [provider]  levothyroxine (SYNTHROID, LEVOTHROID) 100 MCG tablet TAKE 1 TABLET (100 MCG TOTAL) BY MOUTH DAILY BEFORE BREAKFAST. 04/22/17  Yes English, Stephanie D, PA  Multiple Vitamin (MULTIVITAMIN WITH MINERALS) TABS Take 1 tablet by mouth daily.   Yes [provider]  naproxen sodium (ANAPROX) 220 MG tablet Take 220 mg by mouth daily as needed.   Yes [provider]  Olmesartan-Amlodipine-HCTZ (TRIBENZOR) 40-10-25 MG TABS Take 1 tablet by mouth daily. 11/16/13  Yes Carmelina DaneAnderson, Jeffery S, MD  rosuvastatin (CRESTOR) 10 MG tablet Take 1 tablet (10 mg total) by mouth daily. 11/16/13  Yes Carmelina DaneAnderson, Jeffery S, MD  spironolactone (ALDACTONE) 25 MG tablet Take 1 tablet (25 mg total) by mouth daily. 11/16/13  Yes Carmelina DaneAnderson, Jeffery S, MD  triamcinolone cream (KENALOG) 0.1 % Apply topically 2 (two) times daily. Patient not taking: Reported on 05/23/2017 08/12/12   Hamilton CapriLe, Thao P, DO    No Known Allergies  Patient Active Problem List   Diagnosis Date Noted  . Pure hypercholesterolemia 12/24/2014  . Diabetes mellitus type 2, controlled, without complications (HCC) 12/24/2014  . Bruit of right carotid artery 12/24/2014  . Thyroid activity decreased 07/06/2014  . Severe obesity (BMI >= 40) (HCC) 07/06/2014  . HTN (hypertension) 07/06/2014    Past Medical History:  Diagnosis Date  . Hypertension   . Thyroid disease     Past Surgical  History:  Procedure Laterality Date  . ABDOMINAL HYSTERECTOMY    . SHOULDER SURGERY      Social History   Social History  . Marital status: Single    Spouse name: N/A  . Number of children: N/A  . Years of education: N/A   Occupational History  . Not on file.   Social History Main Topics  . Smoking status: Never Smoker  . Smokeless tobacco: Never Used  . Alcohol use No  . Drug use: No  . Sexual activity: Not on file   Other Topics Concern  . Not on file   Social History Narrative  . No narrative on file    History reviewed. No pertinent family history.   Review of Systems  Constitutional: Negative.  Negative for chills, fever and weight loss.  HENT: Negative.  Negative for hearing loss, nosebleeds and sore throat.   Eyes: Negative.  Negative for blurred vision and double vision.  Respiratory: Negative.  Negative for cough and shortness of breath.   Cardiovascular: Negative.  Negative for chest pain and palpitations.  Gastrointestinal: Negative.  Negative for abdominal pain, blood in stool, diarrhea, melena, nausea and vomiting.  Genitourinary: Negative.  Negative for dysuria and hematuria.  Musculoskeletal: Negative.  Negative for back pain, myalgias and neck pain.  Skin: Negative.  Negative for rash.  Neurological: Negative.  Negative for dizziness, sensory change, focal weakness and headaches.  Endo/Heme/Allergies: Negative.   All other systems reviewed and are negative.   Vitals:   05/23/17  1428  BP: 103/66  Pulse: 79  Resp: 18  Temp: 98.3 F (36.8 C)  SpO2: 100%    Physical Exam  Constitutional: She is oriented to person, place, and time. She appears well-developed and well-nourished.  HENT:  Head: Normocephalic and atraumatic.  Right Ear: External ear normal.  Left Ear: External ear normal.  Nose: Nose normal.  Mouth/Throat: Oropharynx is clear and moist.  Eyes: Pupils are equal, round, and reactive to light. Conjunctivae and EOM are normal.    Neck: Normal range of motion. Neck supple. No JVD present. No thyromegaly present.  Cardiovascular: Normal rate, regular rhythm, normal heart sounds and intact distal pulses.   Pulmonary/Chest: Effort normal and breath sounds normal.  Abdominal: Soft. Bowel sounds are normal. She exhibits no distension. There is no tenderness.  Musculoskeletal: Normal range of motion.  Lymphadenopathy:    She has no cervical adenopathy.  Neurological: She is alert and oriented to person, place, and time. No sensory deficit. She exhibits normal muscle tone.  Skin: Skin is warm. Capillary refill takes less than 2 seconds.  Psychiatric: She has a normal mood and affect. Her behavior is normal.  Vitals reviewed.    ASSESSMENT & PLAN: Anna Bernard was seen today for medication refill.  Diagnoses and all orders for this visit:  Thyroid disease -     levothyroxine (SYNTHROID) 100 MCG tablet; Take 1 tablet (100 mcg total) by mouth daily before breakfast.  Controlled type 2 diabetes mellitus without complication, without long-term current use of insulin (HCC) -     HM Diabetes Foot Exam  Essential hypertension  Pure hypercholesterolemia    Patient Instructions       IF you received an x-ray today, you will receive an invoice from [facility]. Please contact [facility] at [phone] with questions or concerns regarding your invoice.   IF you received labwork today, you will receive an invoice from [place]. Please contact [facility] at [phone] with questions or concerns regarding your invoice.   Our billing staff will not be able to assist you with questions regarding bills from these companies.  You will be contacted with the lab results as soon as they are available. The fastest way to get your results is to activate your My Chart account. Instructions are located on the last page of this paperwork. If you have not heard from [place] regarding the results in 2 weeks, please contact  this office.     Hypothyroidism Hypothyroidism is a disorder of the thyroid. The thyroid is a large gland that is located in the lower front of the neck. The thyroid releases hormones that control how the body works. With hypothyroidism, the thyroid does not make enough of these hormones. What are the causes? Causes of hypothyroidism may include:  Viral infections.  Pregnancy.  Your own defense system (immune system) attacking your thyroid.  Certain medicines.  Birth defects.  Past radiation treatments to your head or neck.  Past treatment with radioactive iodine.  Past surgical removal of part or all of your thyroid.  Problems with the gland that is located in the center of your brain (pituitary).  What are the signs or symptoms? Signs and symptoms of hypothyroidism may include:  Feeling as though you have no energy (lethargy).  Inability to tolerate cold.  Weight gain that is not explained by a change in diet or exercise habits.  Dry skin.  Coarse hair.  Menstrual irregularity.  Slowing of thought processes.  Constipation.  Sadness or depression.  How is this diagnosed? Your health care provider may diagnose hypothyroidism with blood tests and ultrasound tests. How is this treated? Hypothyroidism is treated with medicine that replaces the hormones that your body does not make. After you begin treatment, it may take several weeks for symptoms to go away. Follow these instructions at home:  Take medicines only as directed by your health care provider.  If you start taking any new medicines, tell your health care provider.  Keep all follow-up visits as directed by your health care provider. This is important. As your condition improves, your dosage needs may change. You will need to have blood tests regularly so that your health care provider can watch your condition. Contact a health care provider if:  Your symptoms do not get better with treatment.  You  are taking thyroid replacement medicine and: ? You sweat excessively. ? You have tremors. ? You feel anxious. ? You lose weight rapidly. ? You cannot tolerate heat. ? You have emotional swings. ? You have diarrhea. ? You feel weak. Get help right away if:  You develop chest pain.  You develop an irregular heartbeat.  You develop a rapid heartbeat. This information is not intended to replace advice given to you by your health care provider. Make sure you discuss any questions you have with your health care provider. Document Released: 09/05/2005 Document Revised: 02/11/2016 Document Reviewed: 01/21/2014 Elsevier Interactive Patient Education  2017 [employer]     Edwina Barth, MD [facility]

## 2017-05-23 NOTE — Patient Instructions (Addendum)
     IF you received an x-ray today, you will receive an invoice from [facility]. Please contact [facility] at [phone] with questions or concerns regarding your invoice.   IF you received labwork today, you will receive an invoice from [facility]. Please contact [facility] at [phone] with questions or concerns regarding your invoice.   Our billing staff will not be able to assist you with questions regarding bills from these companies.  You will be contacted with the lab results as soon as they are available. The fastest way to get your results is to activate your My Chart account. Instructions are located on the last page of this paperwork. If you have not heard from us regarding the results in 2 weeks, please contact this office.      Hypothyroidism Hypothyroidism is a disorder of the thyroid. The thyroid is a large gland that is located in the lower front of the neck. The thyroid releases hormones that control how the body works. With hypothyroidism, the thyroid does not make enough of these hormones. What are the causes? Causes of hypothyroidism may include:  Viral infections.  Pregnancy.  Your own defense system (immune system) attacking your thyroid.  Certain medicines.  Birth defects.  Past radiation treatments to your head or neck.  Past treatment with radioactive iodine.  Past surgical removal of part or all of your thyroid.  Problems with the gland that is located in the center of your brain (pituitary). What are the signs or symptoms? Signs and symptoms of hypothyroidism may include:  Feeling as though you have no energy (lethargy).  Inability to tolerate cold.  Weight gain that is not explained by a change in diet or exercise habits.  Dry skin.  Coarse hair.  Menstrual irregularity.  Slowing of thought processes.  Constipation.  Sadness or depression. How is this diagnosed? Your health care provider may diagnose  hypothyroidism with blood tests and ultrasound tests. How is this treated? Hypothyroidism is treated with medicine that replaces the hormones that your body does not make. After you begin treatment, it may take several weeks for symptoms to go away. Follow these instructions at home:  Take medicines only as directed by your health care provider.  If you start taking any new medicines, tell your health care provider.  Keep all follow-up visits as directed by your health care provider. This is important. As your condition improves, your dosage needs may change. You will need to have blood tests regularly so that your health care provider can watch your condition. Contact a health care provider if:  Your symptoms do not get better with treatment.  You are taking thyroid replacement medicine and:  You sweat excessively.  You have tremors.  You feel anxious.  You lose weight rapidly.  You cannot tolerate heat.  You have emotional swings.  You have diarrhea.  You feel weak. Get help right away if:  You develop chest pain.  You develop an irregular heartbeat.  You develop a rapid heartbeat. This information is not intended to replace advice given to you by your health care provider. Make sure you discuss any questions you have with your health care provider. Document Released: 09/05/2005 Document Revised: 02/11/2016 Document Reviewed: 01/21/2014 Elsevier Interactive Patient Education  2017 Elsevier Inc.  

## 2017-08-12 ENCOUNTER — Encounter: Payer: Self-pay | Admitting: Family Medicine

## 2017-08-12 ENCOUNTER — Ambulatory Visit: Payer: 59 | Admitting: Family Medicine

## 2017-08-12 VITALS — BP 133/75 | HR 80 | Temp 98.1°F | Resp 16 | Ht 60.0 in | Wt 202.0 lb

## 2017-08-12 DIAGNOSIS — R3129 Other microscopic hematuria: Secondary | ICD-10-CM

## 2017-08-12 DIAGNOSIS — S40022A Contusion of left upper arm, initial encounter: Secondary | ICD-10-CM | POA: Diagnosis not present

## 2017-08-12 DIAGNOSIS — G5701 Lesion of sciatic nerve, right lower limb: Secondary | ICD-10-CM | POA: Diagnosis not present

## 2017-08-12 DIAGNOSIS — M549 Dorsalgia, unspecified: Secondary | ICD-10-CM

## 2017-08-12 LAB — POCT URINALYSIS DIP (MANUAL ENTRY)
Bilirubin, UA: NEGATIVE
Glucose, UA: NEGATIVE mg/dL
Ketones, POC UA: NEGATIVE mg/dL
Leukocytes, UA: NEGATIVE
Nitrite, UA: NEGATIVE
Protein Ur, POC: NEGATIVE mg/dL
Spec Grav, UA: 1.01 (ref 1.010–1.025)
Urobilinogen, UA: 0.2 E.U./dL
pH, UA: 6 (ref 5.0–8.0)

## 2017-08-12 LAB — POC MICROSCOPIC URINALYSIS (UMFC): Mucus: ABSENT

## 2017-08-12 NOTE — Progress Notes (Signed)
11/24/20182:45 PM  Anna Bernard 12/04/1950, 66 y.o. female [id number]  Chief Complaint  Patient presents with  . Bruise    Large bruise upper left arm. Does not itch, not painful per pt. x 4days  . Back Pain    Patient states she has back pain & would like urinalysis    HPI:   Patient is a 66 y.o. female with past medical history significant for HTN and hypothyrodism who presents today for large bruise in the inside of her left arm x 4 days. States she saw it when she woke up 4 days ago, she fell asleep with her cell phone. She states that she has been icing it and it is looking better. She denies any pain.   She also has low back pain, mostly right sided, does not radiate down her pain, wonders if UTI related. Naproxen not helping much. She is about to join a gym as she is trying to lose weight.   [facility] 08/12/2017 05/23/2017 02/27/2016  Decreased Interest 0 0 0  Down, Depressed, Hopeless 0 0 0  PHQ - 2 Score 0 0 0    No Known Allergies  Prior to Admission medications   Medication Sig Start Date End Date Taking? Authorizing Provider  aspirin 81 MG tablet Take 81 mg by mouth daily.   Yes [provider]  Fiber-Vitamins-Minerals (FIBERALL) POWD Take 2 scoop by mouth daily.   Yes [provider]  levothyroxine (SYNTHROID) 100 MCG tablet Take 1 tablet (100 mcg total) by mouth daily before breakfast. 05/23/17  Yes Sagardia, Eilleen KempfMiguel Jose, MD  Multiple Vitamin (MULTIVITAMIN WITH MINERALS) TABS Take 1 tablet by mouth daily.   Yes [provider]  naproxen sodium (ANAPROX) 220 MG tablet Take 220 mg by mouth daily as needed.   Yes [provider]  Olmesartan-Amlodipine-HCTZ (TRIBENZOR) 40-10-25 MG TABS Take 1 tablet by mouth daily. 11/16/13  Yes Carmelina DaneAnderson, Jeffery S, MD  rosuvastatin (CRESTOR) 10 MG tablet Take 1 tablet (10 mg total) by mouth daily. 11/16/13  Yes Carmelina DaneAnderson, Jeffery S, MD  spironolactone (ALDACTONE) 25 MG tablet Take 1 tablet  (25 mg total) by mouth daily. 11/16/13  Yes Carmelina DaneAnderson, Jeffery S, MD  triamcinolone cream (KENALOG) 0.1 % Apply topically 2 (two) times daily. Patient not taking: Reported on 05/23/2017 08/12/12   Lenell AntuLe, Thao P, DO    Past Medical History:  Diagnosis Date  . Hypertension   . Thyroid disease     Past Surgical History:  Procedure Laterality Date  . ABDOMINAL HYSTERECTOMY    . SHOULDER SURGERY      Social History   Tobacco Use  . Smoking status: Never Smoker  . Smokeless tobacco: Never Used  Substance Use Topics  . Alcohol use: No    Alcohol/week: 0.0 oz    History reviewed. No pertinent family history.  ROS Per HPI No fever, chills, nausea, vomiting, flank pain No changes to bowel or bladder function No easy bruising or abnormal bleeding  OBJECTIVE:  Blood pressure 133/75, pulse 80, temperature 98.1 F (36.7 C), temperature source Oral, resp. rate 16, height 5' (1.524 m), weight 202 lb (91.6 kg), SpO2 99 %.  Physical Exam  Gen: AAOx3, NAD Resp: CTAB, no w/r/r CV: RRR, nl s1/s2, no m/r/g Skin: LUE, medial aspect, moderate size purplish ecchymosis, no hematoma, no sign of trauma. No other bruising noted Back: FROM, spinous processes are non tender. No CVAT, + right lumbar paraspinal TTP, no spasms noted. + Right piriformis  TTP, this is area of most TTP. Negative SLR. MSK: hips are FROM, nontender, unremarkable Neuro: BLE strength and sensation are intact,  + 2 reflexes  Results for orders placed or performed in visit on 08/12/17 (from the past 24 hour(s))  POCT urinalysis dipstick     Status: Abnormal   Collection Time: 08/12/17  2:42 PM  Result Value Ref Range   Color, UA yellow yellow   Clarity, UA clear clear   Glucose, UA negative negative mg/dL   Bilirubin, UA negative negative   Ketones, POC UA negative negative mg/dL   Spec Grav, UA [id number] - 1.025   Blood, UA trace-intact (A) negative   pH, UA 6.0 5.0 - 8.0   Protein Ur, POC negative negative mg/dL    Urobilinogen, UA 0.2 0.2 or 1.0 E.U./dL   Nitrite, UA Negative Negative   Leukocytes, UA Negative Negative  POCT Microscopic Urinalysis ([facility])     Status: Abnormal   Collection Time: 08/12/17  2:57 PM  Result Value Ref Range   WBC,UR,HPF,POC None None WBC/hpf   RBC,UR,HPF,POC None None RBC/hpf   Bacteria None None, Too numerous to count   Mucus Absent Absent   Epithelial Cells, UR Per Microscopy Moderate (A) None, Too numerous to count cells/hpf      ASSESSMENT and PLAN 1. Piriformis syndrome of right side Discussed supportive measures, continue with NSAIDs as prescribed. RTC precautions given. Patient educational handout given.  2. Back pain, unspecified back location, unspecified back pain laterality, unspecified chronicity - POCT urinalysis dipstick  3. Arm bruise, left, initial encounter Patient strongly believes that this is related to how she slept with her cell phone. No concerning history or physical findings at this time. RTC precautions discussed.   4. Hematuria, microscopic - POCT Microscopic Urinalysis ([facility]) - No RBCs seen.  Return in about 1 week (around 08/19/2017) for managmengt of chronic medical conditions.    Myles LippsIrma M Santiago, MD [facility] [address] [place], [place] [address] Ph.  [phone] Fax [phone]

## 2017-08-12 NOTE — Progress Notes (Deleted)
   11/24/20182:37 PM  Anna Bernard 04/29/1951, 66 y.o. female [id number]  Chief Complaint  Patient presents with  . Bruise    Large bruise upper left arm. Does not itch, not painful per pt. x 4days  . Back Pain    Patient states she has back pain & would like urinalysis    HPI:   Patient is a 66 y.o. female with past medical history significant for *** who presents today for ***  [facility] 08/12/2017 05/23/2017 02/27/2016  Decreased Interest 0 0 0  Down, Depressed, Hopeless 0 0 0  PHQ - 2 Score 0 0 0    No Known Allergies  Prior to Admission medications   Medication Sig Start Date End Date Taking? Authorizing Provider  aspirin 81 MG tablet Take 81 mg by mouth daily.   Yes [provider]  Fiber-Vitamins-Minerals (FIBERALL) POWD Take 2 scoop by mouth daily.   Yes [provider]  levothyroxine (SYNTHROID) 100 MCG tablet Take 1 tablet (100 mcg total) by mouth daily before breakfast. 05/23/17  Yes Sagardia, Eilleen KempfMiguel Jose, MD  Multiple Vitamin (MULTIVITAMIN WITH MINERALS) TABS Take 1 tablet by mouth daily.   Yes [provider]  naproxen sodium (ANAPROX) 220 MG tablet Take 220 mg by mouth daily as needed.   Yes [provider]  Olmesartan-Amlodipine-HCTZ (TRIBENZOR) 40-10-25 MG TABS Take 1 tablet by mouth daily. 11/16/13  Yes Carmelina DaneAnderson, Jeffery S, MD  rosuvastatin (CRESTOR) 10 MG tablet Take 1 tablet (10 mg total) by mouth daily. 11/16/13  Yes Carmelina DaneAnderson, Jeffery S, MD  spironolactone (ALDACTONE) 25 MG tablet Take 1 tablet (25 mg total) by mouth daily. 11/16/13  Yes Carmelina DaneAnderson, Jeffery S, MD  triamcinolone cream (KENALOG) 0.1 % Apply topically 2 (two) times daily. Patient not taking: Reported on 05/23/2017 08/12/12   Lenell AntuLe, Thao P, DO    Past Medical History:  Diagnosis Date  . Hypertension   . Thyroid disease     Past Surgical History:  Procedure Laterality Date  . ABDOMINAL HYSTERECTOMY    . SHOULDER SURGERY      Social History    Tobacco Use  . Smoking status: Never Smoker  . Smokeless tobacco: Never Used  Substance Use Topics  . Alcohol use: No    Alcohol/week: 0.0 oz    History reviewed. No pertinent family history.  ROS   OBJECTIVE:  Blood pressure 133/75, pulse 80, temperature 98.1 F (36.7 C), temperature source Oral, resp. rate 16, height 5' (1.524 m), weight 202 lb (91.6 kg), SpO2 99 %.  Wt Readings from Last 3 Encounters:  08/12/17 202 lb (91.6 kg)  05/23/17 199 lb 3.2 oz (90.4 kg)  02/27/16 215 lb (97.5 kg)    Physical Exam  No results found for this or any previous visit (from the past 24 hour(s)).  No results found.   ASSESSMENT and PLAN  ***  No Follow-up on file.    Myles LippsIrma M Santiago, MD [facility] at [facility] [address] [place], [place] [address] Ph.  [phone] Fax [phone]

## 2017-08-12 NOTE — Patient Instructions (Addendum)
IF you received an x-ray today, you will receive an invoice from [facility]. Please contact [facility] at [phone] with questions or concerns regarding your invoice.   IF you received labwork today, you will receive an invoice from [facility]. Please contact [facility] at [phone] with questions or concerns regarding your invoice.   Our billing staff will not be able to assist you with questions regarding bills from these companies.  You will be contacted with the lab results as soon as they are available. The fastest way to get your results is to activate your My Chart account. Instructions are located on the last page of this paperwork. If you have not heard from [place] regarding the results in 2 weeks, please contact this office.     Piriformis Syndrome Rehab Ask your health care provider which exercises are safe for you. Do exercises exactly as told by your health care provider and adjust them as directed. It is normal to feel mild stretching, pulling, tightness, or discomfort as you do these exercises, but you should stop right away if you feel sudden pain or your pain gets worse.Do not begin these exercises until told by your health care provider. Stretching and range of motion exercises These exercises warm up your muscles and joints and improve the movement and flexibility of your hip and pelvis. These exercises also help to relieve pain, numbness, and tingling. Exercise A: Hip rotators  1. Lie on your back on a firm surface. 2. Pull your left / right knee toward your same shoulder with your left / right hand until your knee is pointing toward the ceiling. Hold your left / right ankle with your other hand. 3. Keeping your knee steady, gently pull your left / right ankle toward your other shoulder until you feel a stretch in your buttocks. 4. Hold this position for __________ seconds. Repeat __________ times. Complete this stretch __________ times a  day. Exercise B: Hip extensors 1. Lie on your back on a firm surface. Both of your legs should be straight. 2. Pull your left / right knee to your chest. Hold your leg in this position by holding onto the back of your thigh or the front of your knee. 3. Hold this position for __________ seconds. 4. Slowly return to the starting position. Repeat __________ times. Complete this stretch __________ times a day. Strengthening exercises These exercises build strength and endurance in your hip and thigh muscles. Endurance is the ability to use your muscles for a long time, even after they get tired. Exercise C: Straight leg raises ( hip abductors) 1. Lie on your side with your left / right leg in the top position. Lie so your head, shoulder, knee, and hip line up. Bend your bottom knee to help you balance. 2. Lift your top leg up 4-6 inches (10-15 cm), keeping your toes pointed straight ahead. 3. Hold this position for __________ seconds. 4. Slowly lower your leg to the starting position. Let your muscles relax completely. Repeat __________ times. Complete this exercise__________ times a day. Exercise D: Hip abductors and rotators, quadruped  1. Get on your hands and knees on a firm, lightly padded surface. Your hands should be directly below your shoulders, and your knees should be directly below your hips. 2. Lift your left / right knee out to the side. Keep your knee bent. Do not twist your body. 3. Hold this position for __________ seconds. 4. Slowly lower your leg. Repeat __________ times. Complete this  exercise__________ times a day. Exercise E: Straight leg raises ( hip extensors) 1. Lie on your abdomen on a bed or a firm surface with a pillow under your hips. 2. Squeeze your buttock muscles and lift your left / right thigh off the bed. Do not let your back arch. 3. Hold this position for __________ seconds. 4. Slowly return to the starting position. Let your muscles relax completely  before doing another repetition. Repeat __________ times. Complete this exercise__________ times a day. This information is not intended to replace advice given to you by your health care provider. Make sure you discuss any questions you have with your health care provider. Document Released: 09/05/2005 Document Revised: 05/10/2016 Document Reviewed: 08/18/2015 Elsevier Interactive Patient Education  2018 [employer]  Piriformis Syndrome Piriformis syndrome is a condition that can cause pain and numbness in your buttocks and down the back of your leg. Piriformis syndrome happens when the small muscle that connects the base of your spine to your hip (piriformis muscle) presses on the nerve that runs down the back of your leg (sciatic nerve). The piriformis muscle helps your hip rotate and helps to bring your leg back and out. It also helps shift your weight while you are walking to keep you stable. The sciatic nerve runs under or through the piriformis. Damage to the piriformis muscle can cause spasms that put pressure on the nerve below. This causes pain and discomfort while sitting and moving. The pain may feel as if it begins in the buttock and spreads (radiates) down your hip and thigh. What are the causes? This condition is caused by pressure on the sciatic nerve from the piriformis muscle. The piriformis muscle can get irritated with overuse, especially if other hip muscles are weak and the piriformis has to do extra work. Piriformis syndrome can also occur after an injury, like a fall onto your buttocks. What increases the risk? This condition is more likely to develop in:  Women.  People who sit for long periods of time.  Cyclists.  People who have weak buttocks muscles (gluteal muscles).  What are the signs or symptoms? Pain, tingling, or numbness that starts in the buttock and runs down the back of your leg (sciatica) is the most common symptom of this condition. Your symptoms  may:  Get worse the longer you sit.  Get worse when you walk, run, or go up on stairs.  How is this diagnosed? This condition is diagnosed based on your symptoms, medical history, and physical exam. During this exam, your health care provider may move your leg into different positions to check for pain. He or she will also press on the muscles of your hip and buttock to see if that increases your symptoms. You may also have an X-ray or MRI. How is this treated? Treatment for this condition may include:  Stopping all activities that cause pain or make your condition worse.  Using heat or ice to relieve pain as told by your health care provider.  Taking medicines to reduce pain and swelling.  Taking a muscle relaxer to release the piriformis muscle.  Doing range-of-motion and strengthening exercises (physical therapy) as told by your health care provider.  Massaging the affected area.  Getting an injection of an anti-inflammatory medicine or muscle relaxer to reduce inflammation and muscle tension.  In rare cases, you may need surgery to cut the muscle and release pressure on the nerve if other treatments do not work. Follow these instructions at home:  home:  Take over-the-counter and prescription medicines only as told by your health care provider.  Do not sit for long periods. Get up and walk around every 20 minutes or as often as told by your health care provider.  If directed, apply heat to the affected area as often as told by your health care provider. Use the heat source that your health care provider recommends, such as a moist heat pack or a heating pad. ? Place a towel between your skin and the heat source. ? Leave the heat on for 20-30 minutes. ? Remove the heat if your skin turns bright red. This is especially important if you are unable to feel pain, heat, or cold. You may have a greater risk of getting burned.  If directed, apply ice to the injured area. ? Put ice in a  plastic bag. ? Place a towel between your skin and the bag. ? Leave the ice on for 20 minutes, 2-3 times a day.  Do exercises as told by your health care provider.  Return to your normal activities as told by your health care provider. Ask your health care provider what activities are safe for you.  Keep all follow-up visits as told by your health care provider. This is important. How is this prevented?  Do not sit for longer than 20 minutes at a time. When you sit, choose padded surfaces.  Warm up and stretch before being active.  Cool down and stretch after being active.  Give your body time to rest between periods of activity.  Make sure to use equipment that fits you.  Maintain physical fitness, including: ? Strength. ? Flexibility. Contact a health care provider if:  Your pain and stiffness continue or get worse.  Your leg or hip becomes weak.  You have changes in your bowel function or bladder function. This information is not intended to replace advice given to you by your health care provider. Make sure you discuss any questions you have with your health care provider. Document Released: 09/05/2005 Document Revised: 05/10/2016 Document Reviewed: 08/18/2015 Elsevier Interactive Patient Education  2018 Elsevier Inc.  

## 2017-12-19 ENCOUNTER — Encounter: Payer: Self-pay | Admitting: Physician Assistant

## 2017-12-29 ENCOUNTER — Telehealth: Payer: Self-pay | Admitting: Family Medicine

## 2017-12-29 NOTE — Telephone Encounter (Signed)
Copied from CRM [id number]. Topic: Quick Communication - Rx Refill/Question >> Dec 29, 2017  2:50 PM Oneal GroutSebastian, Jennifer S wrote: Medication: Olmesartan-Amlodipine-HCTZ Marya Landry(TRIBENZOR) 40-10-25 MG TABS Has the patient contacted their pharmacy? Yes.   (Agent: If no, request that the patient contact the pharmacy for the refill.) Preferred Pharmacy (with phone number or street name): [facility] [address] Agent: Please be advised that RX refills may take up to 3 business days. We ask that you follow-up with your pharmacy.

## 2018-01-01 ENCOUNTER — Other Ambulatory Visit: Payer: Self-pay | Admitting: *Deleted

## 2018-01-01 DIAGNOSIS — E039 Hypothyroidism, unspecified: Secondary | ICD-10-CM

## 2018-01-01 DIAGNOSIS — I10 Essential (primary) hypertension: Secondary | ICD-10-CM

## 2018-01-01 MED ORDER — OLMESARTAN-AMLODIPINE-HCTZ 40-10-25 MG PO TABS: 1.0000 | ORAL_TABLET | Freq: Every day | ORAL | 1 refills | Status: DC

## 2018-01-01 NOTE — Telephone Encounter (Signed)
Rx refill per protocol- LOV: 05-23-17

## 2018-01-28 ENCOUNTER — Other Ambulatory Visit: Payer: Self-pay

## 2018-01-28 ENCOUNTER — Observation Stay (HOSPITAL_COMMUNITY)
Admission: EM | Admit: 2018-01-28 | Discharge: 2018-02-01 | DRG: 872 | Disposition: A | Discharge status: Home / Self Care | Payer: 59 | Attending: Internal Medicine | Admitting: Internal Medicine

## 2018-01-28 DIAGNOSIS — N289 Disorder of kidney and ureter, unspecified: Secondary | ICD-10-CM

## 2018-01-28 DIAGNOSIS — Z6841 Body Mass Index (BMI) 40.0 and over, adult: Secondary | ICD-10-CM | POA: Diagnosis not present

## 2018-01-28 DIAGNOSIS — E869 Volume depletion, unspecified: Secondary | ICD-10-CM | POA: Diagnosis present

## 2018-01-28 DIAGNOSIS — N179 Acute kidney failure, unspecified: Secondary | ICD-10-CM | POA: Diagnosis not present

## 2018-01-28 DIAGNOSIS — Z79890 Hormone replacement therapy (postmenopausal): Secondary | ICD-10-CM | POA: Diagnosis not present

## 2018-01-28 DIAGNOSIS — E785 Hyperlipidemia, unspecified: Secondary | ICD-10-CM | POA: Diagnosis present

## 2018-01-28 DIAGNOSIS — N12 Tubulo-interstitial nephritis, not specified as acute or chronic: Secondary | ICD-10-CM | POA: Diagnosis not present

## 2018-01-28 DIAGNOSIS — E039 Hypothyroidism, unspecified: Secondary | ICD-10-CM | POA: Diagnosis not present

## 2018-01-28 DIAGNOSIS — I10 Essential (primary) hypertension: Secondary | ICD-10-CM | POA: Diagnosis present

## 2018-01-28 DIAGNOSIS — Z23 Encounter for immunization: Secondary | ICD-10-CM

## 2018-01-28 DIAGNOSIS — A419 Sepsis, unspecified organism: Secondary | ICD-10-CM | POA: Diagnosis not present

## 2018-01-28 DIAGNOSIS — D509 Iron deficiency anemia, unspecified: Secondary | ICD-10-CM | POA: Diagnosis present

## 2018-01-28 DIAGNOSIS — Z79899 Other long term (current) drug therapy: Secondary | ICD-10-CM | POA: Diagnosis not present

## 2018-01-28 DIAGNOSIS — Z7982 Long term (current) use of aspirin: Secondary | ICD-10-CM | POA: Diagnosis not present

## 2018-01-28 DIAGNOSIS — E079 Disorder of thyroid, unspecified: Secondary | ICD-10-CM | POA: Diagnosis present

## 2018-01-28 DIAGNOSIS — D500 Iron deficiency anemia secondary to blood loss (chronic): Secondary | ICD-10-CM | POA: Diagnosis present

## 2018-01-28 DIAGNOSIS — N3000 Acute cystitis without hematuria: Secondary | ICD-10-CM | POA: Diagnosis present

## 2018-01-28 DIAGNOSIS — N390 Urinary tract infection, site not specified: Secondary | ICD-10-CM

## 2018-01-28 LAB — DIFFERENTIAL
Basophils Absolute: 0 10*3/uL (ref 0.0–0.1)
Basophils Relative: 0 %
Eosinophils Absolute: 0 10*3/uL (ref 0.0–0.7)
Eosinophils Relative: 0 %
Lymphocytes Relative: 9 %
Lymphs Abs: 1.5 10*3/uL (ref 0.7–4.0)
Monocytes Absolute: 1.9 10*3/uL — ABNORMAL HIGH (ref 0.1–1.0)
Monocytes Relative: 11 %
Neutro Abs: 13.5 10*3/uL — ABNORMAL HIGH (ref 1.7–7.7)
Neutrophils Relative %: 80 %

## 2018-01-28 LAB — PROTIME-INR
INR: 1.15
Prothrombin Time: 14.6 seconds (ref 11.4–15.2)

## 2018-01-28 LAB — CBC
HCT: 25.8 % — ABNORMAL LOW (ref 36.0–46.0)
Hemoglobin: 8.6 g/dL — ABNORMAL LOW (ref 12.0–15.0)
MCH: 23.6 pg — ABNORMAL LOW (ref 26.0–34.0)
MCHC: 33.3 g/dL (ref 30.0–36.0)
MCV: 70.9 fL — ABNORMAL LOW (ref 78.0–100.0)
Platelets: 299 10*3/uL (ref 150–400)
RBC: 3.64 MIL/uL — ABNORMAL LOW (ref 3.87–5.11)
RDW: 15.8 % — ABNORMAL HIGH (ref 11.5–15.5)
WBC: 17 10*3/uL — ABNORMAL HIGH (ref 4.0–10.5)

## 2018-01-28 LAB — COMPREHENSIVE METABOLIC PANEL
ALT: 22 U/L (ref 14–54)
AST: 28 U/L (ref 15–41)
Albumin: 3.4 g/dL — ABNORMAL LOW (ref 3.5–5.0)
Alkaline Phosphatase: 71 U/L (ref 38–126)
Anion gap: 11 (ref 5–15)
BUN: 36 mg/dL — ABNORMAL HIGH (ref 6–20)
CO2: 22 mmol/L (ref 22–32)
Calcium: 8.5 mg/dL — ABNORMAL LOW (ref 8.9–10.3)
Chloride: 105 mmol/L (ref 101–111)
Creatinine, Ser: 1.83 mg/dL — ABNORMAL HIGH (ref 0.44–1.00)
GFR calc Af Amer: 32 mL/min — ABNORMAL LOW (ref >60)
GFR calc non Af Amer: 28 mL/min — ABNORMAL LOW (ref >60)
Glucose, Bld: 122 mg/dL — ABNORMAL HIGH (ref 65–99)
Potassium: 4.3 mmol/L (ref 3.5–5.1)
Sodium: 138 mmol/L (ref 135–145)
Total Bilirubin: 0.8 mg/dL (ref 0.3–1.2)
Total Protein: 7.2 g/dL (ref 6.5–8.1)

## 2018-01-28 LAB — I-STAT TROPONIN, ED: Troponin i, poc: 0.01 ng/mL (ref 0.00–0.08)

## 2018-01-28 LAB — APTT: aPTT: 31 seconds (ref 24–36)

## 2018-01-28 NOTE — ED Triage Notes (Signed)
Patient presents to ED for assessment of dizziness since Friday, with right knee pain, right foot pain, right sided weakness (no drift or weakness noted by this RN) with mid back pain.  Patient c/o "intense fatigue" and has been sleeping "without any control over it" since Friday.  Patient then also states she was seen by her PCP for the same last week and "the medicine is too strong".  Patient states she also has not taken any of her BP meds since Friday "until you guys can tell me what's going on"

## 2018-01-29 ENCOUNTER — Other Ambulatory Visit: Payer: Self-pay

## 2018-01-29 ENCOUNTER — Emergency Department (HOSPITAL_COMMUNITY): Payer: 59

## 2018-01-29 ENCOUNTER — Inpatient Hospital Stay (HOSPITAL_COMMUNITY): Payer: 59

## 2018-01-29 ENCOUNTER — Encounter (HOSPITAL_COMMUNITY): Payer: Self-pay | Admitting: Family Medicine

## 2018-01-29 DIAGNOSIS — I10 Essential (primary) hypertension: Secondary | ICD-10-CM | POA: Diagnosis not present

## 2018-01-29 DIAGNOSIS — N390 Urinary tract infection, site not specified: Secondary | ICD-10-CM

## 2018-01-29 DIAGNOSIS — A419 Sepsis, unspecified organism: Secondary | ICD-10-CM | POA: Diagnosis not present

## 2018-01-29 DIAGNOSIS — D500 Iron deficiency anemia secondary to blood loss (chronic): Secondary | ICD-10-CM | POA: Diagnosis present

## 2018-01-29 DIAGNOSIS — E079 Disorder of thyroid, unspecified: Secondary | ICD-10-CM | POA: Diagnosis not present

## 2018-01-29 DIAGNOSIS — D509 Iron deficiency anemia, unspecified: Secondary | ICD-10-CM | POA: Diagnosis not present

## 2018-01-29 DIAGNOSIS — N289 Disorder of kidney and ureter, unspecified: Secondary | ICD-10-CM

## 2018-01-29 LAB — CBC WITH DIFFERENTIAL/PLATELET
Basophils Absolute: 0 10*3/uL (ref 0.0–0.1)
Basophils Relative: 0 %
Eosinophils Absolute: 0 10*3/uL (ref 0.0–0.7)
Eosinophils Relative: 0 %
HCT: 23 % — ABNORMAL LOW (ref 36.0–46.0)
Hemoglobin: 7.6 g/dL — ABNORMAL LOW (ref 12.0–15.0)
Lymphocytes Relative: 4 %
Lymphs Abs: 0.6 10*3/uL — ABNORMAL LOW (ref 0.7–4.0)
MCH: 23.2 pg — ABNORMAL LOW (ref 26.0–34.0)
MCHC: 33 g/dL (ref 30.0–36.0)
MCV: 70.1 fL — ABNORMAL LOW (ref 78.0–100.0)
Monocytes Absolute: 0.8 10*3/uL (ref 0.1–1.0)
Monocytes Relative: 6 %
Neutro Abs: 12.9 10*3/uL — ABNORMAL HIGH (ref 1.7–7.7)
Neutrophils Relative %: 90 %
Platelets: 293 10*3/uL (ref 150–400)
RBC: 3.28 MIL/uL — ABNORMAL LOW (ref 3.87–5.11)
RDW: 15.6 % — ABNORMAL HIGH (ref 11.5–15.5)
WBC: 14.3 10*3/uL — ABNORMAL HIGH (ref 4.0–10.5)

## 2018-01-29 LAB — URINALYSIS, ROUTINE W REFLEX MICROSCOPIC
Bilirubin Urine: NEGATIVE
Glucose, UA: NEGATIVE mg/dL
Ketones, ur: NEGATIVE mg/dL
Nitrite: POSITIVE — AB
Protein, ur: 100 mg/dL — AB
Specific Gravity, Urine: 1.013 (ref 1.005–1.030)
WBC, UA: >50 WBC/hpf — ABNORMAL HIGH (ref 0–5)
pH: 5 (ref 5.0–8.0)

## 2018-01-29 LAB — BASIC METABOLIC PANEL
Anion gap: 8 (ref 5–15)
BUN: 33 mg/dL — ABNORMAL HIGH (ref 6–20)
CO2: 22 mmol/L (ref 22–32)
Calcium: 7.8 mg/dL — ABNORMAL LOW (ref 8.9–10.3)
Chloride: 107 mmol/L (ref 101–111)
Creatinine, Ser: 1.59 mg/dL — ABNORMAL HIGH (ref 0.44–1.00)
GFR calc Af Amer: 38 mL/min — ABNORMAL LOW (ref >60)
GFR calc non Af Amer: 33 mL/min — ABNORMAL LOW (ref >60)
Glucose, Bld: 225 mg/dL — ABNORMAL HIGH (ref 65–99)
Potassium: 3.9 mmol/L (ref 3.5–5.1)
Sodium: 137 mmol/L (ref 135–145)

## 2018-01-29 LAB — SODIUM, URINE, RANDOM: Sodium, Ur: 37 mmol/L

## 2018-01-29 LAB — LACTIC ACID, PLASMA
Lactic Acid, Venous: 1.3 mmol/L (ref 0.5–1.9)
Lactic Acid, Venous: 0.9 mmol/L (ref 0.5–1.9)

## 2018-01-29 LAB — IRON AND TIBC
Iron: 8 ug/dL — ABNORMAL LOW (ref 28–170)
Saturation Ratios: 3 % — ABNORMAL LOW (ref 10.4–31.8)
TIBC: 316 ug/dL (ref 250–450)
UIBC: 308 ug/dL

## 2018-01-29 LAB — CBG MONITORING, ED
Glucose-Capillary: 196 mg/dL — ABNORMAL HIGH (ref 65–99)
Glucose-Capillary: 175 mg/dL — ABNORMAL HIGH (ref 65–99)
Glucose-Capillary: 116 mg/dL — ABNORMAL HIGH (ref 65–99)
Glucose-Capillary: 115 mg/dL — ABNORMAL HIGH (ref 65–99)

## 2018-01-29 LAB — RETICULOCYTES
RBC.: 3.28 MIL/uL — ABNORMAL LOW (ref 3.87–5.11)
Retic Count, Absolute: 26.2 10*3/uL (ref 19.0–186.0)
Retic Ct Pct: 0.8 % (ref 0.4–3.1)

## 2018-01-29 LAB — CREATININE, URINE, RANDOM: Creatinine, Urine: 178.04 mg/dL

## 2018-01-29 LAB — HIV ANTIBODY (ROUTINE TESTING W REFLEX): HIV Screen 4th Generation wRfx: NONREACTIVE

## 2018-01-29 LAB — FOLATE: Folate: 41 ng/mL (ref >5.9)

## 2018-01-29 LAB — FERRITIN: Ferritin: 41 ng/mL (ref 11–307)

## 2018-01-29 LAB — VITAMIN B12: Vitamin B-12: 119 pg/mL — ABNORMAL LOW (ref 180–914)

## 2018-01-29 LAB — ABO/RH: ABO/RH(D): O POS

## 2018-01-29 LAB — PREPARE RBC (CROSSMATCH)

## 2018-01-29 MED ORDER — ACETAMINOPHEN 650 MG RE SUPP: 650.0000 mg | Freq: Four times a day (QID) | RECTAL | Status: DC | PRN

## 2018-01-29 MED ORDER — ADULT MULTIVITAMIN W/MINERALS CH
1.0000 | ORAL_TABLET | Freq: Every day | ORAL | Status: DC
  Administered 2018-01-29 – 2018-02-01 (×4): 1 via ORAL
  Filled 2018-01-29 (×4): qty 1

## 2018-01-29 MED ORDER — SODIUM CHLORIDE 0.9 % IV SOLN
2.0000 g | INTRAVENOUS | Status: DC
  Administered 2018-01-29 – 2018-01-31 (×3): 2 g via INTRAVENOUS
  Filled 2018-01-29 (×4): qty 20

## 2018-01-29 MED ORDER — ASPIRIN 81 MG PO CHEW
81.0000 mg | CHEWABLE_TABLET | Freq: Every day | ORAL | Status: DC
  Administered 2018-01-29 – 2018-02-01 (×4): 81 mg via ORAL
  Filled 2018-01-29 (×4): qty 1

## 2018-01-29 MED ORDER — SODIUM CHLORIDE 0.9 % IV SOLN: 1.0000 g | INTRAVENOUS | Status: DC

## 2018-01-29 MED ORDER — SODIUM CHLORIDE 0.9% FLUSH
3.0000 mL | Freq: Two times a day (BID) | INTRAVENOUS | Status: DC
  Administered 2018-01-29 – 2018-01-31 (×4): 3 mL via INTRAVENOUS

## 2018-01-29 MED ORDER — SODIUM CHLORIDE 0.9 % IV SOLN
Freq: Once | INTRAVENOUS | Status: AC
  Administered 2018-01-29: 15:00:00 via INTRAVENOUS

## 2018-01-29 MED ORDER — HYDROCODONE-ACETAMINOPHEN 5-325 MG PO TABS: 1.0000 | ORAL_TABLET | ORAL | Status: DC | PRN

## 2018-01-29 MED ORDER — SODIUM CHLORIDE 0.9 % IV SOLN: 2.0000 g | INTRAVENOUS | Status: DC

## 2018-01-29 MED ORDER — CYANOCOBALAMIN 1000 MCG/ML IJ SOLN
1000.0000 ug | Freq: Once | INTRAMUSCULAR | Status: AC
  Administered 2018-01-29: 1000 ug via INTRAMUSCULAR
  Filled 2018-01-29: qty 1

## 2018-01-29 MED ORDER — ROSUVASTATIN CALCIUM 10 MG PO TABS
10.0000 mg | ORAL_TABLET | Freq: Every day | ORAL | Status: DC
  Filled 2018-01-29: qty 1

## 2018-01-29 MED ORDER — SODIUM CHLORIDE 0.9 % IV SOLN
INTRAVENOUS | Status: DC
  Administered 2018-01-29 – 2018-01-31 (×4): via INTRAVENOUS

## 2018-01-29 MED ORDER — SODIUM CHLORIDE 0.9 % IV SOLN
1.0000 g | Freq: Once | INTRAVENOUS | Status: AC
  Administered 2018-01-29: 1 g via INTRAVENOUS
  Filled 2018-01-29: qty 10

## 2018-01-29 MED ORDER — INSULIN ASPART 100 UNIT/ML ~~LOC~~ SOLN: 0.0000 [IU] | Freq: Three times a day (TID) | SUBCUTANEOUS | Status: DC

## 2018-01-29 MED ORDER — CEPHALEXIN 500 MG PO CAPS: 500.0000 mg | ORAL_CAPSULE | Freq: Four times a day (QID) | ORAL | 0 refills | Status: DC

## 2018-01-29 MED ORDER — MORPHINE SULFATE (PF) 4 MG/ML IV SOLN
4.0000 mg | Freq: Once | INTRAVENOUS | Status: AC
  Administered 2018-01-29: 4 mg via INTRAVENOUS
  Filled 2018-01-29: qty 1

## 2018-01-29 MED ORDER — ACETAMINOPHEN 325 MG PO TABS
650.0000 mg | ORAL_TABLET | Freq: Four times a day (QID) | ORAL | Status: DC | PRN
  Administered 2018-01-29 – 2018-01-30 (×3): 650 mg via ORAL
  Filled 2018-01-29 (×3): qty 2

## 2018-01-29 MED ORDER — VITAMIN B-12 100 MCG PO TABS
500.0000 ug | ORAL_TABLET | Freq: Every day | ORAL | Status: DC
  Administered 2018-01-30 – 2018-02-01 (×3): 500 ug via ORAL
  Filled 2018-01-29 (×3): qty 5

## 2018-01-29 MED ORDER — SODIUM CHLORIDE 0.9 % IV BOLUS
1000.0000 mL | Freq: Once | INTRAVENOUS | Status: AC
  Administered 2018-01-29: 1000 mL via INTRAVENOUS

## 2018-01-29 MED ORDER — ROSUVASTATIN CALCIUM 10 MG PO TABS
10.0000 mg | ORAL_TABLET | Freq: Every day | ORAL | Status: DC
  Administered 2018-01-29 – 2018-01-31 (×3): 10 mg via ORAL
  Filled 2018-01-29 (×3): qty 1

## 2018-01-29 MED ORDER — SODIUM CHLORIDE 0.9 % IV BOLUS
500.0000 mL | Freq: Once | INTRAVENOUS | Status: AC
  Administered 2018-01-29: 500 mL via INTRAVENOUS

## 2018-01-29 MED ORDER — LEVOTHYROXINE SODIUM 100 MCG PO TABS
100.0000 ug | ORAL_TABLET | Freq: Every day | ORAL | Status: DC
  Administered 2018-01-30 – 2018-01-31 (×2): 100 ug via ORAL
  Filled 2018-01-29 (×3): qty 1

## 2018-01-29 MED ORDER — HYDROCODONE-ACETAMINOPHEN 5-325 MG PO TABS: 1.0000 | ORAL_TABLET | Freq: Four times a day (QID) | ORAL | 0 refills | Status: DC | PRN

## 2018-01-29 NOTE — Discharge Instructions (Addendum)
Follow with Myles Lipps, MD in 1-2 weeks  Please get a complete blood count and chemistry panel checked by your Primary MD at your next visit, and again as instructed by your Primary MD. Please get your medications reviewed and adjusted by your Primary MD.  Please request your Primary MD to go over all Hospital Tests and Procedure/Radiological results at the follow up, please get all Hospital records sent to your Prim MD by signing hospital release before you go home.  If you had Pneumonia of Lung problems at the Hospital: Please get a 2 view Chest X ray done in 6-8 weeks after hospital discharge or sooner if instructed by your Primary MD.  If you have Congestive Heart Failure: Please call your Cardiologist or Primary MD anytime you have any of the following symptoms:  1) 3 pound weight gain in 24 hours or 5 pounds in 1 week  2) shortness of breath, with or without a dry hacking cough  3) swelling in the hands, feet or stomach  4) if you have to sleep on extra pillows at night in order to breathe  Follow cardiac low salt diet and 1.5 lit/day fluid restriction.  If you have diabetes Accuchecks 4 times/day, Once in AM empty stomach and then before each meal. Log in all results and show them to your primary doctor at your next visit. If any glucose reading is under 80 or above 300 call your primary MD immediately.  If you have Seizure/Convulsions/Epilepsy: Please do not drive, operate heavy machinery, participate in activities at heights or participate in high speed sports until you have seen by Primary MD or a Neurologist and advised to do so again.  If you had Gastrointestinal Bleeding: Please ask your Primary MD to check a complete blood count within one week of discharge or at your next visit. Your endoscopic/colonoscopic biopsies that are pending at the time of discharge, will also need to followed by your Primary MD.  Get Medicines reviewed and adjusted. Please take all your  medications with you for your next visit with your Primary MD  Please request your Primary MD to go over all hospital tests and procedure/radiological results at the follow up, please ask your Primary MD to get all Hospital records sent to his/her office.  If you experience worsening of your admission symptoms, develop shortness of breath, life threatening emergency, suicidal or homicidal thoughts you must seek medical attention immediately by calling 911 or calling your MD immediately  if symptoms less severe.  You must read complete instructions/literature along with all the possible adverse reactions/side effects for all the Medicines you take and that have been prescribed to you. Take any new Medicines after you have completely understood and accpet all the possible adverse reactions/side effects.   Do not drive or operate heavy machinery when taking Pain medications.   Do not take more than prescribed Pain, Sleep and Anxiety Medications  Special Instructions: If you have smoked or chewed Tobacco  in the last 2 yrs please stop smoking, stop any regular Alcohol  and or any Recreational drug use.  Wear Seat belts while driving.  Please note You were cared for by a hospitalist during your hospital stay. If you have any questions about your discharge medications or the care you received while you were in the hospital after you are discharged, you can call the unit and asked to speak with the hospitalist on call if the hospitalist that took care of you is not available.  Once you are discharged, your primary care physician will handle any further medical issues. Please note that NO REFILLS for any discharge medications will be authorized once you are discharged, as it is imperative that you return to your primary care physician (or establish a relationship with a primary care physician if you do not have one) for your aftercare needs so that they can reassess your need for medications and monitor your  lab values.  You can reach the hospitalist office at phone [phone] or fax [phone]   If you do not have a primary care physician, you can call [phone] for a physician referral.  Activity: As tolerated with Full fall precautions use walker/cane & assistance as needed  Diet: regular  Disposition Home

## 2018-01-29 NOTE — ED Notes (Signed)
Admitting aware of bp  

## 2018-01-29 NOTE — ED Notes (Signed)
Admitting paged about bp  

## 2018-01-29 NOTE — ED Notes (Signed)
Checked patient blood sugar it was 196 notified RN of blood sugar patient is resing with call bell in reach

## 2018-01-29 NOTE — ED Notes (Signed)
Admitting paged to RN Hayley per her request 

## 2018-01-29 NOTE — ED Notes (Signed)
Patient not aware of why the blood transfusion is needed. Patient not agreeing to transfusion at this time. Admitting paged.

## 2018-01-29 NOTE — ED Notes (Signed)
Admitting paged regarding temp

## 2018-01-29 NOTE — ED Notes (Signed)
Carb modified lunch tray ordered 

## 2018-01-29 NOTE — ED Provider Notes (Addendum)
[facility] EMERGENCY DEPARTMENT Provider Note   CSN: [id number] Arrival date & time: 01/28/18  2035     History   Chief Complaint Chief Complaint  Patient presents with  . Back Pain  . Knee Pain    HPI Anna Bernard is a 67 y.o. female.  Patient is a 67 year old female with past medical history of borderline diabetes, hypothyroidism, hypertension, and hypercholesterolemia.  She presents today for evaluation of pain in her back, right knee, and head.  She states that this is been ongoing for the past several weeks.  She has a slight fever here in the ED, but denies fevers at home.  She denies any bowel or bladder complaints.  She denies any injury or trauma.  She denies any chest pain or productive cough.  The history is provided by the patient.  Back Pain   This is a new problem. Episode onset: Several weeks ago. The problem occurs constantly. The problem has been gradually worsening. The pain is associated with no known injury. The pain is present in the lumbar spine and sacro-iliac joint. The quality of the pain is described as aching. The pain does not radiate. The pain is moderate. The symptoms are aggravated by bending and twisting. Associated symptoms include a fever. Pertinent negatives include no numbness, no bowel incontinence, no bladder incontinence, no paresis and no tingling.  Knee Pain   Pertinent negatives include no numbness and no tingling.    Past Medical History:  Diagnosis Date  . Hypertension   . Thyroid disease     Patient Active Problem List   Diagnosis Date Noted  . Pure hypercholesterolemia 12/24/2014  . Diabetes mellitus type 2, controlled, without complications (HCC) 12/24/2014  . Bruit of right carotid artery 12/24/2014  . Thyroid disease 07/06/2014  . Severe obesity (BMI >= 40) (HCC) 07/06/2014  . HTN (hypertension) 07/06/2014    Past Surgical History:  Procedure Laterality Date  . ABDOMINAL HYSTERECTOMY    . SHOULDER  SURGERY       OB History   None      Home Medications    Prior to Admission medications   Medication Sig Start Date End Date Taking? Authorizing Provider  aspirin 81 MG tablet Take 81 mg by mouth daily.    [provider]  Fiber-Vitamins-Minerals (FIBERALL) POWD Take 2 scoop by mouth daily.    [provider]  levothyroxine (SYNTHROID) 100 MCG tablet Take 1 tablet (100 mcg total) by mouth daily before breakfast. 05/23/17   Georgina Quint, MD  Multiple Vitamin (MULTIVITAMIN WITH MINERALS) TABS Take 1 tablet by mouth daily.    [provider]  naproxen sodium (ANAPROX) 220 MG tablet Take 220 mg by mouth daily as needed.    [provider]  Olmesartan-amLODIPine-HCTZ (TRIBENZOR) 40-10-25 MG TABS Take 1 tablet by mouth daily. 01/01/18   Georgina Quint, MD  rosuvastatin (CRESTOR) 10 MG tablet Take 1 tablet (10 mg total) by mouth daily. 11/16/13   Carmelina Dane, MD  spironolactone (ALDACTONE) 25 MG tablet Take 1 tablet (25 mg total) by mouth daily. 11/16/13   Carmelina Dane, MD  triamcinolone cream (KENALOG) 0.1 % Apply topically 2 (two) times daily. Patient not taking: Reported on 05/23/2017 08/12/12   Lenell Antu, DO    Family History No family history on file.  Social History Social History   Tobacco Use  . Smoking status: Never Smoker  . Smokeless tobacco: Never Used  Substance Use Topics  .  Alcohol use: No    Alcohol/week: 0.0 oz  . Drug use: No     Allergies   Patient has no known allergies.   Review of Systems Review of Systems  Constitutional: Positive for fever.  Gastrointestinal: Negative for bowel incontinence.  Genitourinary: Negative for bladder incontinence.  Musculoskeletal: Positive for back pain.  Neurological: Negative for tingling and numbness.  All other systems reviewed and are negative.    Physical Exam Updated Vital Signs BP (!) 100/58 (BP Location: Right Arm)   Pulse 98   Temp (!) 100.5  F (38.1 C) (Oral)   Resp 14   SpO2 98%   Physical Exam  Constitutional: She is oriented to person, place, and time. She appears well-developed and well-nourished. No distress.  HENT:  Head: Normocephalic and atraumatic.  Mouth/Throat: Oropharynx is clear and moist.  Neck: Normal range of motion. Neck supple.  Cardiovascular: Normal rate and regular rhythm. Exam reveals no gallop and no friction rub.  No murmur heard. Pulmonary/Chest: Effort normal and breath sounds normal. No respiratory distress. She has no wheezes.  Abdominal: Soft. Bowel sounds are normal. She exhibits no distension. There is no tenderness.  Musculoskeletal: Normal range of motion.  The right knee appears grossly normal.  There is no effusion, redness, swelling, or warmth.  She has good range of motion with some crepitus.  It appears stable on anterior and posterior drawer test and there is no laxity with varus or valgus stress.  Neurological: She is alert and oriented to person, place, and time. No cranial nerve deficit. She exhibits normal muscle tone. Coordination normal.  Skin: Skin is warm and dry. She is not diaphoretic.  Nursing note and vitals reviewed.    ED Treatments / Results  Labs (all labs ordered are listed, but only abnormal results are displayed) Labs Reviewed  CBC - Abnormal; Notable for the following components:      Result Value   WBC 17.0 (*)    RBC 3.64 (*)    Hemoglobin 8.6 (*)    HCT 25.8 (*)    MCV 70.9 (*)    MCH 23.6 (*)    RDW 15.8 (*)    All other components within normal limits  DIFFERENTIAL - Abnormal; Notable for the following components:   Neutro Abs 13.5 (*)    Monocytes Absolute 1.9 (*)    All other components within normal limits  COMPREHENSIVE METABOLIC PANEL - Abnormal; Notable for the following components:   Glucose, Bld 122 (*)    BUN 36 (*)    Creatinine, Ser 1.83 (*)    Calcium 8.5 (*)    Albumin 3.4 (*)    GFR calc non Af Amer 28 (*)    GFR calc Af Amer 32  (*)    All other components within normal limits  PROTIME-INR  APTT  URINALYSIS, ROUTINE W REFLEX MICROSCOPIC  I-STAT TROPONIN, ED    EKG None  Radiology No results found.  Procedures Procedures (including critical care time)  Medications Ordered in ED Medications  sodium chloride 0.9 % bolus 1,000 mL (has no administration in time range)  morphine 4 MG/ML injection 4 mg (has no administration in time range)     Initial Impression / Assessment and Plan / ED Course  I have reviewed the triage vital signs and the nursing notes.  Pertinent labs & imaging results that were available during my care of the patient were reviewed by me and considered in my medical decision making (see chart for  details).  Patient with low-grade fever, elevated white count, back pain, and evidence for a urinary tract infection.  This will be treated with ceftriaxone intravenously, followed by discharge with Keflex and pain medicine.  She is also complaining of right knee pain which I have a very low suspicion for septic arthritis.  There is no warmth, redness, swelling, or effusion and she has good range of motion with minimal pain.  She was offered admission for treatment of what is possibly a complicated UTI, however the patient is adamant about going home.  ADDENDUM: When patient was ambulated, she became dizzy, shaky, and very weak.  I have now convinced her that admission would be in her best interest.  I have spoken with Dr. Antionette Char who agrees to admit.  Final Clinical Impressions(s) / ED Diagnoses   Final diagnoses:  None    ED Discharge Orders    None       Geoffery Lyons, MD 01/29/18 4098    Geoffery Lyons, MD 01/29/18 [phone]

## 2018-01-29 NOTE — ED Notes (Signed)
Admitting at bedside 

## 2018-01-29 NOTE — ED Notes (Signed)
Attending MD At bedside consenting patient for blood transfusion

## 2018-01-29 NOTE — Progress Notes (Signed)
Patient admitted after midnight, please see H&P.  Suspect BP low from infection--- IVF and transfuse 1 unit PRBC.  Will be going to SDU-- mentation seems stable.  Unsure of baseline kidney function. Blood sugars elevated-- added HgbA1c, SSI B12 low as well-- will supplement  Marlin Canary DO

## 2018-01-29 NOTE — ED Notes (Signed)
PT states understanding of care given, follow up care, and medication prescribed. PT ambulated from ED to car with a steady gait. 

## 2018-01-29 NOTE — ED Notes (Signed)
Admitting doctor paged to RN [facility] regarding low blood pressure per her request

## 2018-01-29 NOTE — ED Notes (Signed)
Patient placed on 2L  

## 2018-01-29 NOTE — ED Notes (Signed)
Ordered a heart healthy breakfast--Anna Bernard  

## 2018-01-29 NOTE — H&P (Signed)
History and Physical    Anna Bernard:[id number] DOB: Feb 02, 1951 DOA: 01/28/2018  PCP: Myles Lipps, MD   Patient coming from: Home  Chief Complaint: Lightheaded, malaise, back pain   HPI: Anna Bernard is a 67 y.o. female with medical history significant for hypothyroidism and hypertension, now presenting to the emergency department with 3 days of lightheadedness, general malaise, and pain in her mid back.  She is not very forthcoming with history, but relates that she has been feeling poorly for at least 3 days, mainly complaining of fatigue and malaise.  She does acknowledge pain in the mid back and right knee.  She has had similar pain previously.  Denies any precipitating event or trauma.  ED Course: Upon arrival to the ED, patient is found to be febrile to 38.1 C, saturating well on room air, and with blood pressure 90s over 50s.  Chest x-ray is negative for acute cardiopulmonary disease and UA is suggestive of infection.  Chemistry panel is notable for creatinine 1.83, up from 0.52 on the most recent prior from 2014.  CBC is notable for leukocytosis to 17,000 and a microcytic anemia with hemoglobin of 8.6.  Troponin is normal.  Patient was given a liter of normal saline, 4 mg IV morphine, and 1 g of IV Rocephin in the ED.  Blood pressure remains in the low normal range and the patient will be admitted for ongoing evaluation and management of sepsis secondary to UTI.  Review of Systems:  All other systems reviewed and apart from HPI, are negative.  Past Medical History:  Diagnosis Date  . Hypertension   . Thyroid disease     Past Surgical History:  Procedure Laterality Date  . ABDOMINAL HYSTERECTOMY    . SHOULDER SURGERY       reports that she has never smoked. She has never used smokeless tobacco. She reports that she does not drink alcohol or use drugs.  No Known Allergies  History reviewed. No pertinent family history.   Prior to Admission medications     Medication Sig Start Date End Date Taking? Authorizing Provider  aspirin 81 MG tablet Take 81 mg by mouth daily.    [provider]  cephALEXin (KEFLEX) 500 MG capsule Take 1 capsule (500 mg total) by mouth 4 (four) times daily. 01/29/18   Geoffery Lyons, MD  Fiber-Vitamins-Minerals [facility]) POWD Take 2 scoop by mouth daily.    [provider]  HYDROcodone-acetaminophen (NORCO/VICODIN) 5-325 MG tablet Take 1-2 tablets by mouth every 6 (six) hours as needed. 01/29/18   Geoffery Lyons, MD  levothyroxine (SYNTHROID) 100 MCG tablet Take 1 tablet (100 mcg total) by mouth daily before breakfast. 05/23/17   Georgina Quint, MD  Multiple Vitamin (MULTIVITAMIN WITH MINERALS) TABS Take 1 tablet by mouth daily.    [provider]  naproxen sodium (ANAPROX) 220 MG tablet Take 220 mg by mouth daily as needed.    [provider]  Olmesartan-amLODIPine-HCTZ (TRIBENZOR) 40-10-25 MG TABS Take 1 tablet by mouth daily. 01/01/18   Georgina Quint, MD  rosuvastatin (CRESTOR) 10 MG tablet Take 1 tablet (10 mg total) by mouth daily. 11/16/13   Carmelina Dane, MD  spironolactone (ALDACTONE) 25 MG tablet Take 1 tablet (25 mg total) by mouth daily. 11/16/13   Carmelina Dane, MD  triamcinolone cream (KENALOG) 0.1 % Apply topically 2 (two) times daily. Patient not taking: Reported on 05/23/2017 08/12/12   Lenell Antu, DO    Physical Exam:  Vitals:   01/29/18 0315 01/29/18 0330 01/29/18 0345 01/29/18 0430  BP: (!) 105/40 (!) 107/45 122/66 119/61  Pulse: 94 95 95   Resp: 20 (!) 22 17   Temp:      TempSrc:      SpO2: 95% 97% 95%       Constitutional: NAD, calm  Eyes: PERTLA, lids and conjunctivae normal ENMT: Mucous membranes are moist. Posterior pharynx clear of any exudate or lesions.   Neck: normal, supple, no masses, no thyromegaly Respiratory: clear to auscultation bilaterally, no wheezing, no crackles. Normal respiratory effort.  Cardiovascular: S1 & S2 heard,  regular rate and rhythm. Trace pretibial edema bilaterally. No significant JVD. Abdomen: No distension, soft, mild generalized tenderness without rebound pain or guarding. Bowel sounds active.  Musculoskeletal: no clubbing / cyanosis. Right knee tender at anterior and medial joint line without effusion, heat, or erythema.    Skin: no significant rashes, lesions, ulcers. Warm, dry, well-perfused. Neurologic: CN II-XII intact. Sensation to light touch intact, DTR normal. Strength 5/5 in all 4 limbs.  Psychiatric: Alert. Calm, withdrawn.      Labs on Admission: I have personally reviewed following labs and imaging studies  CBC: Recent Labs  Lab 01/28/18 2144  WBC 17.0*  NEUTROABS 13.5*  HGB 8.6*  HCT 25.8*  MCV 70.9*  PLT 299   Basic Metabolic Panel: Recent Labs  Lab 01/28/18 2144  NA 138  K 4.3  CL 105  CO2 22  GLUCOSE 122*  BUN 36*  CREATININE 1.83*  CALCIUM 8.5*   GFR: CrCl cannot be calculated (Unknown ideal weight.). Liver Function Tests: Recent Labs  Lab 01/28/18 2144  AST 28  ALT 22  ALKPHOS 71  BILITOT 0.8  PROT 7.2  ALBUMIN 3.4*   No results for input(s): LIPASE, AMYLASE in the last 168 hours. No results for input(s): AMMONIA in the last 168 hours. Coagulation Profile: Recent Labs  Lab 01/28/18 2144  INR 1.15   Cardiac Enzymes: No results for input(s): CKTOTAL, CKMB, CKMBINDEX, TROPONINI in the last 168 hours. BNP (last 3 results) No results for input(s): PROBNP in the last 8760 hours. HbA1C: No results for input(s): HGBA1C in the last 72 hours. CBG: No results for input(s): GLUCAP in the last 168 hours. Lipid Profile: No results for input(s): CHOL, HDL, LDLCALC, TRIG, CHOLHDL, LDLDIRECT in the last 72 hours. Thyroid Function Tests: No results for input(s): TSH, T4TOTAL, FREET4, T3FREE, THYROIDAB in the last 72 hours. Anemia Panel: No results for input(s): VITAMINB12, FOLATE, FERRITIN, TIBC, IRON, RETICCTPCT in the last 72 hours. Urine  analysis:    Component Value Date/Time   COLORURINE AMBER (A) 01/29/2018 0052   APPEARANCEUR CLOUDY (A) 01/29/2018 0052   LABSPEC 1.013 01/29/2018 0052   PHURINE 5.0 01/29/2018 0052   GLUCOSEU NEGATIVE 01/29/2018 0052   HGBUR MODERATE (A) 01/29/2018 0052   BILIRUBINUR NEGATIVE 01/29/2018 0052   BILIRUBINUR negative 08/12/2017 1442   BILIRUBINUR neg 12/23/2014 1208   KETONESUR NEGATIVE 01/29/2018 0052   PROTEINUR 100 (A) 01/29/2018 0052   UROBILINOGEN 0.2 08/12/2017 1442   NITRITE POSITIVE (A) 01/29/2018 0052   LEUKOCYTESUR MODERATE (A) 01/29/2018 0052   Sepsis Labs: (procalcitonin:4,lacticidven:4) )No results found for this or any previous visit (from the past 240 hour(s)).   Radiological Exams on Admission: Dg Chest 2 View  Result Date: 01/29/2018 CLINICAL DATA:  Acute onset of generalized body aches and fever. EXAM: CHEST - 2 VIEW COMPARISON:  None. FINDINGS: The lungs are well-aerated and clear. There is no  evidence of focal opacification, pleural effusion or pneumothorax. The heart is normal in size; the mediastinal contour is within normal limits. No acute osseous abnormalities are seen. IMPRESSION: No acute cardiopulmonary process seen. Electronically Signed   By: Roanna Raider M.D.   On: 01/29/2018 00:57    EKG: Not performed.    Assessment/Plan   1. Sepsis secondary to UTI  - Presents with 3 days of lightheadedness, malaise, and back pain - Found to be febrile with leukocytosis and UA suggestive of infection  - Treated in ED with 1 liter NS and 1 g Rocephin  - Collect blood and urine cultures, check lactic acid, continue IVF, give additional gram Rocephin now and continue with 2 g IV daily while following cultures and clinical course    2. Renal insufficiency  - SCr is 1.83 on admission, up from 0.52 on most recent prior from 2014  - Acute prerenal etiology possible in setting of sepsis with low BP  - Check urine chemistries, continue IVF hydration, hold  ARB, HCTZ, and Aldactone, and renally-dose medications   - Given urosepsis and back pain, check RUS to r/o obstruction   3. Microcytic anemia  - Hgb is 8.6 with MCV 70.9  - No bleeding, no prior CBC available for comparison   - Type and screen, check anemia panel   4. Hypertension  - BP low in ED  - Antihypertensives held on admission    5. Hypothyroidism  - Continue Synthroid     DVT prophylaxis: SCD's  Code Status: Full  Family Communication: Discussed with patient Consults called: None  Admission status: Inpatient    Briscoe Deutscher, MD [facility] Pager [phone]  If 7PM-7AM, please contact night-coverage [email or web address] Password [facility]  01/29/2018, 5:36 AM

## 2018-01-30 ENCOUNTER — Encounter (HOSPITAL_COMMUNITY): Payer: Self-pay | Admitting: General Practice

## 2018-01-30 DIAGNOSIS — N289 Disorder of kidney and ureter, unspecified: Secondary | ICD-10-CM | POA: Diagnosis not present

## 2018-01-30 DIAGNOSIS — A419 Sepsis, unspecified organism: Secondary | ICD-10-CM | POA: Diagnosis not present

## 2018-01-30 DIAGNOSIS — N390 Urinary tract infection, site not specified: Secondary | ICD-10-CM | POA: Diagnosis not present

## 2018-01-30 DIAGNOSIS — N179 Acute kidney failure, unspecified: Secondary | ICD-10-CM | POA: Diagnosis not present

## 2018-01-30 DIAGNOSIS — I10 Essential (primary) hypertension: Secondary | ICD-10-CM | POA: Diagnosis not present

## 2018-01-30 DIAGNOSIS — N3000 Acute cystitis without hematuria: Secondary | ICD-10-CM | POA: Diagnosis not present

## 2018-01-30 DIAGNOSIS — E079 Disorder of thyroid, unspecified: Secondary | ICD-10-CM | POA: Diagnosis not present

## 2018-01-30 DIAGNOSIS — D509 Iron deficiency anemia, unspecified: Secondary | ICD-10-CM | POA: Diagnosis not present

## 2018-01-30 LAB — TYPE AND SCREEN
ABO/RH(D): O POS
Antibody Screen: NEGATIVE
Unit division: 0
Unit division: 0

## 2018-01-30 LAB — BASIC METABOLIC PANEL
Anion gap: 9 (ref 5–15)
BUN: 21 mg/dL — ABNORMAL HIGH (ref 6–20)
CO2: 23 mmol/L (ref 22–32)
Calcium: 8.1 mg/dL — ABNORMAL LOW (ref 8.9–10.3)
Chloride: 110 mmol/L (ref 101–111)
Creatinine, Ser: 1.03 mg/dL — ABNORMAL HIGH (ref 0.44–1.00)
GFR calc Af Amer: >60 mL/min (ref >60)
GFR calc non Af Amer: 56 mL/min — ABNORMAL LOW (ref >60)
Glucose, Bld: 97 mg/dL (ref 65–99)
Potassium: 4 mmol/L (ref 3.5–5.1)
Sodium: 142 mmol/L (ref 135–145)

## 2018-01-30 LAB — GLUCOSE, CAPILLARY
Glucose-Capillary: 92 mg/dL (ref 65–99)
Glucose-Capillary: 101 mg/dL — ABNORMAL HIGH (ref 65–99)
Glucose-Capillary: 95 mg/dL (ref 65–99)
Glucose-Capillary: 127 mg/dL — ABNORMAL HIGH (ref 65–99)

## 2018-01-30 LAB — CBC
HCT: 25.8 % — ABNORMAL LOW (ref 36.0–46.0)
Hemoglobin: 8.7 g/dL — ABNORMAL LOW (ref 12.0–15.0)
MCH: 24.3 pg — ABNORMAL LOW (ref 26.0–34.0)
MCHC: 33.7 g/dL (ref 30.0–36.0)
MCV: 72.1 fL — ABNORMAL LOW (ref 78.0–100.0)
Platelets: 277 10*3/uL (ref 150–400)
RBC: 3.58 MIL/uL — ABNORMAL LOW (ref 3.87–5.11)
RDW: 16.9 % — ABNORMAL HIGH (ref 11.5–15.5)
WBC: 14.7 10*3/uL — ABNORMAL HIGH (ref 4.0–10.5)

## 2018-01-30 LAB — URINE CULTURE: Culture: NO GROWTH

## 2018-01-30 LAB — BPAM RBC
Blood Product Expiration Date: 201905131333
Blood Product Expiration Date: 201905192359
ISSUE DATE / TIME: 201905130933
ISSUE DATE / TIME: 201905131435
Unit Type and Rh: 5100
Unit Type and Rh: 5100

## 2018-01-30 LAB — MRSA PCR SCREENING: MRSA by PCR: NEGATIVE

## 2018-01-30 LAB — HEMOGLOBIN A1C
Hgb A1c MFr Bld: 6.1 % — ABNORMAL HIGH (ref 4.8–5.6)
Mean Plasma Glucose: 128.37 mg/dL

## 2018-01-30 LAB — TSH: TSH: 3.125 u[IU]/mL (ref 0.350–4.500)

## 2018-01-30 LAB — UREA NITROGEN, URINE: Urea Nitrogen, Ur: 523 mg/dL

## 2018-01-30 MED ORDER — PNEUMOCOCCAL VAC POLYVALENT 25 MCG/0.5ML IJ INJ
0.5000 mL | INJECTION | INTRAMUSCULAR | Status: AC
  Administered 2018-01-31: 0.5 mL via INTRAMUSCULAR
  Filled 2018-01-30: qty 0.5

## 2018-01-30 NOTE — Progress Notes (Addendum)
PROGRESS NOTE    Anna Bernard  WGN:[id number] DOB: 06-22-1951 DOA: 01/28/2018 PCP: Myles Lipps, MD   Brief Narrative:  Patient is a 67 years old female with medical history significant for hypothyroidism and hypertension, now presenting to the emergency department with 3 days of lightheadedness, general malaise, and pain in her mid back.  She is not very forthcoming with history, but relates that she has been feeling poorly for at least 3 days, mainly complaining of fatigue and malaise.  She does acknowledge pain in the mid back and right knee.  She has had similar pain previously.  Denies any precipitating event or trauma. Patient was admitted to hospital for sepsis secondary to UTI.    Following conditions were addressed during hospitalization,  Assessment & Plan:   Principal Problem:   Sepsis secondary to UTI [facility]) Active Problems:   Thyroid disease   HTN (hypertension)   Microcytic anemia   Renal insufficiency  Sepsis secondary to UTI.  Patient is on IV antibiotics, fluids.  Abnormal urinalysis and leukocytosis. chest x-ray was negative for infection.  Urine culture and blood cultures no growth so far.  Lactic acid was within normal limits.  Still with significant leukocytosis.  T-max of 101.8 F.  Ultrasound of the kidneys showed negative findings.  Hypertension.  Presented with low blood pressure on admission.  Resume home medications once her blood pressure stabilizes.  Still borderline low.  Continue on IV fluids.  Acute kidney injury likely secondary to volume depletion and sepsis.  We will continue with IV fluids closely monitor blood pressure.  Diet controlled diabetes.  Continue with sliding scale insulin.  Anemia.  Microcytic.  No evidence of bleeding.  Patient received 1 unit of packed RBC  Debility weakness generalized aches.  Will ambulate the patient  DVT prophylaxis: SCDs Code Status: Full Family Communication:   Disposition Plan:  Home in 1 to 2 days,  check labs in a.m.  Consultants: None  Procedures: None  Antimicrobials: Rocephin 2 g  Subjective: Patient states that she feels really bad with generalized aches and pains and weakness.  She denies any urinary urgency frequency or dysuria.  Denies any chest pain palpitation or shortness of breath.  Objective: Vitals:   01/30/18 0000 01/30/18 0041 01/30/18 0044 01/30/18 0351  BP:  (!) 102/52 (!) 102/52 (!) 118/50  Pulse:   75 84  Resp:  Temp:  99 F (37.2 C) 99 F (37.2 C) 99.4 F (37.4 C)  TempSrc:  Oral Oral Oral  SpO2:  99% 99% 94%  Weight: 94 kg (207 lb 4.8 oz)     Height: 5' 0.5" (1.537 m)       Intake/Output Summary (Last 24 hours) at 01/30/2018 1016 Last data filed at 01/30/2018 7846 Gross per 24 hour  Intake 1055 ml  Output 2675 ml  Net -1620 ml   Filed Weights   01/30/18 0000  Weight: 94 kg (207 lb 4.8 oz)    Examination:  General exam: Appears calm and comfortable ,Not in distress,average built, obese HEENT:PERRL,Oral mucosa moist, Ear/Nose normal on gross exam Respiratory system: Bilateral equal air entry, normal vesicular breath sounds, no wheezes or crackles  Cardiovascular system: S1 & S2 heard, RRR. No JVD, murmurs, rubs, gallops or clicks. No pedal edema. Gastrointestinal system: Abdomen is nondistended, soft tenderness over the left costovertebral angle. No organomegaly or masses felt. Normal bowel sounds heard. Central nervous system: Alert and oriented. No focal neurological deficits. Extremities: No edema, no  clubbing ,no cyanosis, distal peripheral pulses palpable. Skin: No rashes, lesions or ulcers,no icterus ,no pallor MSK: Normal muscle bulk,tone ,power, trace edema to her extremity Psychiatry: Judgement and insight appear normal. Mood & affect appropriate.    Data Reviewed: I have personally reviewed following labs and imaging studies  CBC: Recent Labs  Lab 01/28/18 2144 01/29/18 0557 01/30/18 0422  WBC 17.0* 14.3* 14.7*    NEUTROABS 13.5* 12.9*  --   HGB 8.6* 7.6* 8.7*  HCT 25.8* 23.0* 25.8*  MCV 70.9* 70.1* 72.1*  PLT 299 293 277   Basic Metabolic Panel: Recent Labs  Lab 01/28/18 2144 01/29/18 0906 01/30/18 0422  NA 138 137 142  K 4.3 3.9 4.0  CL 105 107 110  CO2 GLUCOSE 122* 225* 97  BUN 36* 33* 21*  CREATININE 1.83* 1.59* 1.03*  CALCIUM 8.5* 7.8* 8.1*   GFR: Estimated Creatinine Clearance: 56.4 mL/min (A) (by C-G formula based on SCr of 1.03 mg/dL (H)). Liver Function Tests: Recent Labs  Lab 01/28/18 2144  AST 28  ALT 22  ALKPHOS 71  BILITOT 0.8  PROT 7.2  ALBUMIN 3.4*   No results for input(s): LIPASE, AMYLASE in the last 168 hours. No results for input(s): AMMONIA in the last 168 hours. Coagulation Profile: Recent Labs  Lab 01/28/18 2144  INR 1.15   Cardiac Enzymes: No results for input(s): CKTOTAL, CKMB, CKMBINDEX, TROPONINI in the last 168 hours. BNP (last 3 results) No results for input(s): PROBNP in the last 8760 hours. HbA1C: Recent Labs    01/30/18 0422  HGBA1C 6.1*   CBG: Recent Labs  Lab 01/29/18 0830 01/29/18 1206 01/29/18 1703 01/29/18 2218 01/30/18 0738  GLUCAP 196* 175* 116* 115* 92   Lipid Profile: No results for input(s): CHOL, HDL, LDLCALC, TRIG, CHOLHDL, LDLDIRECT in the last 72 hours. Thyroid Function Tests: Recent Labs    01/30/18 0422  TSH 3.125   Anemia Panel: Recent Labs    01/29/18 0557  VITAMINB12 119*  FOLATE 41.0  FERRITIN 41  TIBC 316  IRON 8*  RETICCTPCT 0.8   Sepsis Labs: Recent Labs  Lab 01/29/18 0557 01/29/18 0832  LATICACIDVEN 1.3 0.9    Recent Results (from the past 240 hour(s))  Culture, blood (Routine X 2) w Reflex to ID Panel     Status: None (Preliminary result)   Collection Time: 01/29/18  5:57 AM  Result Value Ref Range Status   Specimen Description BLOOD RIGHT ANTECUBITAL  Final   Special Requests   Final    BOTTLES DRAWN AEROBIC AND ANAEROBIC Blood Culture results may not be optimal  due to an excessive volume of blood received in culture bottles   Culture   Final    NO GROWTH 1 DAY Performed at [facility], [address], [place], [place] [address]    Report Status PENDING  Incomplete  Culture, blood (Routine X 2) w Reflex to ID Panel     Status: None (Preliminary result)   Collection Time: 01/29/18  6:00 AM  Result Value Ref Range Status   Specimen Description BLOOD LEFT HAND  Final   Special Requests   Final    BOTTLES DRAWN AEROBIC AND ANAEROBIC Blood Culture adequate volume   Culture   Final    NO GROWTH 1 DAY Performed at [facility], [address] [place], [place] [address]    Report Status PENDING  Incomplete  Urine culture     Status: None  Collection Time: 01/29/18  8:00 AM  Result Value Ref Range Status   Specimen Description URINE, CLEAN CATCH  Final   Special Requests NONE  Final   Culture   Final    NO GROWTH Performed at [facility], [address], [place], [place] [address]    Report Status 01/30/2018 FINAL  Final  MRSA PCR Screening     Status: None   Collection Time: 01/30/18  1:05 AM  Result Value Ref Range Status   MRSA by PCR NEGATIVE NEGATIVE Final    Comment:        The GeneXpert MRSA Assay (FDA approved for NASAL specimens only), is one component of a comprehensive MRSA colonization surveillance program. It is not intended to diagnose MRSA infection nor to guide or monitor treatment for MRSA infections. Performed at [facility], [address] [place], [place] [address]       Radiology Studies: Dg Chest 2 View  Result Date: 01/29/2018 CLINICAL DATA:  Acute onset of generalized body aches and fever. EXAM: CHEST - 2 VIEW COMPARISON:  None. FINDINGS: The lungs are well-aerated and clear. There is no evidence of focal opacification, pleural effusion or pneumothorax. The heart is normal in size; the mediastinal contour is within normal limits. No acute osseous abnormalities are seen. IMPRESSION:  No acute cardiopulmonary process seen. Electronically Signed   By: Roanna Raider M.D.   On: 01/29/2018 00:57   US Renal  Result Date: 01/29/2018 CLINICAL DATA:  67 year old hypertensive female with sepsis secondary to UTI. Initial encounter. EXAM: RENAL / URINARY TRACT ULTRASOUND COMPLETE COMPARISON:  None. FINDINGS: Right Kidney: Length: 14.3 cm. Echogenicity within normal limits. No mass or hydronephrosis visualized. Left Kidney: Length: 13.5 cm. Echogenicity within normal limits. No mass or hydronephrosis visualized. Bladder: Appears normal for degree of bladder distention. IMPRESSION: Negative renal sonogram. Please note, ultrasound may not be sensitive for detection of/demonstration of pyelonephritis. Electronically Signed   By: Lacy Duverney M.D.   On: 01/29/2018 07:07      Scheduled Meds: . aspirin  81 mg Oral Daily  . insulin aspart  0-9 Units Subcutaneous TID WC  . levothyroxine  100 mcg Oral QAC breakfast  . multivitamin with minerals  1 tablet Oral Daily  . [START ON 01/31/2018] pneumococcal 23 valent vaccine  0.5 mL Intramuscular Tomorrow-1000  . rosuvastatin  10 mg Oral QHS  . sodium chloride flush  3 mL Intravenous Q12H  . vitamin B-12  500 mcg Oral Daily   Continuous Infusions: . sodium chloride 100 mL/hr at 01/29/18 2018  . cefTRIAXone (ROCEPHIN)  IV Stopped (01/29/18 2304)     LOS: 1 day    Time spent: More than 50% of that time was spent in counseling and/or coordination of care.   Joycelyn Das, MD [facility] Pager [phone]  If 7PM-7AM, please contact night-coverage [email or web address] Password [email or web address] 01/30/2018, 10:16 AM

## 2018-01-31 DIAGNOSIS — N10 Acute tubulo-interstitial nephritis: Secondary | ICD-10-CM | POA: Diagnosis not present

## 2018-01-31 DIAGNOSIS — A419 Sepsis, unspecified organism: Secondary | ICD-10-CM

## 2018-01-31 DIAGNOSIS — I10 Essential (primary) hypertension: Secondary | ICD-10-CM | POA: Diagnosis not present

## 2018-01-31 DIAGNOSIS — N3000 Acute cystitis without hematuria: Secondary | ICD-10-CM | POA: Diagnosis not present

## 2018-01-31 DIAGNOSIS — D509 Iron deficiency anemia, unspecified: Secondary | ICD-10-CM

## 2018-01-31 DIAGNOSIS — N390 Urinary tract infection, site not specified: Secondary | ICD-10-CM

## 2018-01-31 DIAGNOSIS — N179 Acute kidney failure, unspecified: Secondary | ICD-10-CM | POA: Diagnosis not present

## 2018-01-31 DIAGNOSIS — E079 Disorder of thyroid, unspecified: Secondary | ICD-10-CM | POA: Diagnosis not present

## 2018-01-31 LAB — CBC
HCT: 25.7 % — ABNORMAL LOW (ref 36.0–46.0)
Hemoglobin: 8.6 g/dL — ABNORMAL LOW (ref 12.0–15.0)
MCH: 24.2 pg — ABNORMAL LOW (ref 26.0–34.0)
MCHC: 33.5 g/dL (ref 30.0–36.0)
MCV: 72.2 fL — ABNORMAL LOW (ref 78.0–100.0)
Platelets: 293 10*3/uL (ref 150–400)
RBC: 3.56 MIL/uL — ABNORMAL LOW (ref 3.87–5.11)
RDW: 16.8 % — ABNORMAL HIGH (ref 11.5–15.5)
WBC: 10.4 10*3/uL (ref 4.0–10.5)

## 2018-01-31 LAB — GLUCOSE, CAPILLARY
Glucose-Capillary: 83 mg/dL (ref 65–99)
Glucose-Capillary: 88 mg/dL (ref 65–99)
Glucose-Capillary: 94 mg/dL (ref 65–99)

## 2018-01-31 LAB — BASIC METABOLIC PANEL
Anion gap: 8 (ref 5–15)
BUN: 11 mg/dL (ref 6–20)
CO2: 24 mmol/L (ref 22–32)
Calcium: 8.3 mg/dL — ABNORMAL LOW (ref 8.9–10.3)
Chloride: 111 mmol/L (ref 101–111)
Creatinine, Ser: 0.81 mg/dL (ref 0.44–1.00)
GFR calc Af Amer: >60 mL/min (ref >60)
GFR calc non Af Amer: >60 mL/min (ref >60)
Glucose, Bld: 90 mg/dL (ref 65–99)
Potassium: 4 mmol/L (ref 3.5–5.1)
Sodium: 143 mmol/L (ref 135–145)

## 2018-01-31 NOTE — Plan of Care (Signed)
  Problem: Education: Goal: Knowledge of General Education information will improve Outcome: Progressing Note:  POC reviewed with pt.   

## 2018-01-31 NOTE — Care Management Note (Signed)
Case Management Note  Patient Details  Name: DESTINEY SANABIA MRN: [id number] Date of Birth: 05-21-1951  Subjective/Objective:                 Patient from home w spouse. Patient states that she drives, has no difficulty getting to MD or getting medications. She states she does not have or use DME at home. No CM needs anticipated at DC.    Action/Plan:   Expected Discharge Date:                  Expected Discharge Plan:  Home/Self Care  In-House Referral:     Discharge planning Services  CM Consult  Post Acute Care Choice:    Choice offered to:     DME Arranged:    DME Agency:     HH Arranged:    HH Agency:     Status of Service:  In process, will continue to follow  If discussed at Long Length of Stay Meetings, dates discussed:    Additional Comments:  Lawerance Sabal, RN 01/31/2018, 2:31 PM

## 2018-01-31 NOTE — Progress Notes (Addendum)
PROGRESS NOTE  Anna Bernard ZOX:[id number] DOB: June 27, 1951 DOA: 01/28/2018 PCP: Myles Lipps, MD   LOS: 2 days   Brief Narrative / Interim history: 67 year old female with hypothyroidism, hypertension, who was admitted to the hospital on 5/13 with several days of weakness, lightheadedness and dizziness, malaise as well as bilateral flank pain.  She was diagnosed with a urinary tract infection/pyelonephritis, was septic and was admitted to the hospital for IV antibiotics.  Assessment & Plan: Principal Problem:   Sepsis secondary to UTI [facility]) Active Problems:   Thyroid disease   HTN (hypertension)   Microcytic anemia   Renal insufficiency   Sepsis due to UTI/pyelonephritis -Fever curve improving however still febrile to 100.7 overnight, blood cultures are negative unfortunately urine culture has been negative as well -Continue to treat with IV antibiotics and transition to p.o. when she becomes afebrile -Flank pain is mainly on the left side, improving -Renal ultrasound without acute findings  Hypertension -She was hypotensive on admission and placed on IV fluids, she is eating and drinking well, will stop fluids today.  At home she is on spironolactone only, hold that for now and monitor blood pressure  Hypothyroidism -Continue Synthroid  Hyperlipidemia -Continue Crestor  Acute kidney injury -In the setting of sepsis, creatinine has now normalized.  Stop IV fluids.   DVT prophylaxis: SCDs Code Status: Full code Family Communication: no family at bedside Disposition Plan: home when fever resolves   Consultants:   None   Procedures:   None   Antimicrobials:  Ceftriaxone 5/13   Subjective: -Feeling better denies any lightheadedness, dizziness, weakness.  No chest pain, no abdominal pain, no nausea or vomiting.  Still has left flank pain but improved.  Objective: Vitals:   01/30/18 2051 01/30/18 2324 01/31/18 0449 01/31/18 0739  BP: 129/62 (!) 133/59 (!)  133/59 (!) 140/57  Pulse: 76 80 77 79  Resp: Temp: 100 F (37.8 C) 99.6 F (37.6 C) (!) 100.7 F (38.2 C) 99.5 F (37.5 C)  TempSrc: Oral Oral Oral Oral  SpO2: 96% 95% 95% 93%  Weight:   93.2 kg (205 lb 6.4 oz)   Height:        Intake/Output Summary (Last 24 hours) at 01/31/2018 1136 Last data filed at 01/31/2018 0848 Gross per 24 hour  Intake 5205 ml  Output 4800 ml  Net 405 ml   Filed Weights   01/30/18 0000 01/31/18 0449  Weight: 94 kg (207 lb 4.8 oz) 93.2 kg (205 lb 6.4 oz)    Examination: Constitutional: NAD Eyes: lids and conjunctivae normal ENMT: Mucous membranes are moist. Respiratory: clear to auscultation bilaterally, no wheezing, no crackles. Normal respiratory effort. No accessory muscle use.  Cardiovascular: Regular rate and rhythm, no murmurs / rubs / gallops. No LE edema. Abdomen: no tenderness. Bowel sounds positive.  Musculoskeletal: Left CVA tenderness Neurologic: Nonfocal, ambulatory  Data Reviewed: I have independently reviewed following labs and imaging studies   CBC: Recent Labs  Lab 01/28/18 2144 01/29/18 0557 01/30/18 0422 01/31/18 0422  WBC 17.0* 14.3* 14.7* 10.4  NEUTROABS 13.5* 12.9*  --   --   HGB 8.6* 7.6* 8.7* 8.6*  HCT 25.8* 23.0* 25.8* 25.7*  MCV 70.9* 70.1* 72.1* 72.2*  PLT 299 293 277 293   Basic Metabolic Panel: Recent Labs  Lab 01/28/18 2144 01/29/18 0906 01/30/18 0422 01/31/18 0422  NA 138 137 142 143  K 4.3 3.9 4.0 4.0  CL 105 107 110 111  CO2 22 22  23 24  GLUCOSE 122* 225* 97 90  BUN 36* 33* 21* 11  CREATININE 1.83* 1.59* 1.03* 0.81  CALCIUM 8.5* 7.8* 8.1* 8.3*   GFR: Estimated Creatinine Clearance: 71.4 mL/min (by C-G formula based on SCr of 0.81 mg/dL). Liver Function Tests: Recent Labs  Lab 01/28/18 2144  AST 28  ALT 22  ALKPHOS 71  BILITOT 0.8  PROT 7.2  ALBUMIN 3.4*   No results for input(s): LIPASE, AMYLASE in the last 168 hours. No results for input(s): AMMONIA in the last 168  hours. Coagulation Profile: Recent Labs  Lab 01/28/18 2144  INR 1.15   Cardiac Enzymes: No results for input(s): CKTOTAL, CKMB, CKMBINDEX, TROPONINI in the last 168 hours. BNP (last 3 results) No results for input(s): PROBNP in the last 8760 hours. HbA1C: Recent Labs    01/30/18 0422  HGBA1C 6.1*   CBG: Recent Labs  Lab 01/30/18 0738 01/30/18 1200 01/30/18 1635 01/30/18 2141 01/31/18 0737  GLUCAP 92 101* 95 127* 83   Lipid Profile: No results for input(s): CHOL, HDL, LDLCALC, TRIG, CHOLHDL, LDLDIRECT in the last 72 hours. Thyroid Function Tests: Recent Labs    01/30/18 0422  TSH 3.125   Anemia Panel: Recent Labs    01/29/18 0557  VITAMINB12 119*  FOLATE 41.0  FERRITIN 41  TIBC 316  IRON 8*  RETICCTPCT 0.8   Urine analysis:    Component Value Date/Time   COLORURINE AMBER (A) 01/29/2018 0052   APPEARANCEUR CLOUDY (A) 01/29/2018 0052   LABSPEC 1.013 01/29/2018 0052   PHURINE 5.0 01/29/2018 0052   GLUCOSEU NEGATIVE 01/29/2018 0052   HGBUR MODERATE (A) 01/29/2018 0052   BILIRUBINUR NEGATIVE 01/29/2018 0052   BILIRUBINUR negative 08/12/2017 1442   BILIRUBINUR neg 12/23/2014 1208   KETONESUR NEGATIVE 01/29/2018 0052   PROTEINUR 100 (A) 01/29/2018 0052   UROBILINOGEN 0.2 08/12/2017 1442   NITRITE POSITIVE (A) 01/29/2018 0052   LEUKOCYTESUR MODERATE (A) 01/29/2018 0052   Sepsis Labs: Invalid input(s): PROCALCITONIN, LACTICIDVEN  Recent Results (from the past 240 hour(s))  Culture, blood (Routine X 2) w Reflex to ID Panel     Status: None (Preliminary result)   Collection Time: 01/29/18  5:57 AM  Result Value Ref Range Status   Specimen Description BLOOD RIGHT ANTECUBITAL  Final   Special Requests   Final    BOTTLES DRAWN AEROBIC AND ANAEROBIC Blood Culture results may not be optimal due to an excessive volume of blood received in culture bottles   Culture   Final    NO GROWTH 1 DAY Performed at [facility], [address] [place], [place]  [address]    Report Status PENDING  Incomplete  Culture, blood (Routine X 2) w Reflex to ID Panel     Status: None (Preliminary result)   Collection Time: 01/29/18  6:00 AM  Result Value Ref Range Status   Specimen Description BLOOD LEFT HAND  Final   Special Requests   Final    BOTTLES DRAWN AEROBIC AND ANAEROBIC Blood Culture adequate volume   Culture   Final    NO GROWTH 1 DAY Performed at [facility], [address], [place], [place] [address]    Report Status PENDING  Incomplete  Urine culture     Status: None   Collection Time: 01/29/18  8:00 AM  Result Value Ref Range Status   Specimen Description URINE, CLEAN CATCH  Final   Special Requests NONE  Final   Culture   Final  NO GROWTH Performed at [facility], [address], [place], [place] [address]    Report Status 01/30/2018 FINAL  Final  MRSA PCR Screening     Status: None   Collection Time: 01/30/18  1:05 AM  Result Value Ref Range Status   MRSA by PCR NEGATIVE NEGATIVE Final    Comment:        The GeneXpert MRSA Assay (FDA approved for NASAL specimens only), is one component of a comprehensive MRSA colonization surveillance program. It is not intended to diagnose MRSA infection nor to guide or monitor treatment for MRSA infections. Performed at [facility], [address] [place], [place] [address]       Radiology Studies: No results found.   Scheduled Meds: . aspirin  81 mg Oral Daily  . insulin aspart  0-9 Units Subcutaneous TID WC  . levothyroxine  100 mcg Oral QAC breakfast  . multivitamin with minerals  1 tablet Oral Daily  . rosuvastatin  10 mg Oral QHS  . sodium chloride flush  3 mL Intravenous Q12H  . vitamin B-12  500 mcg Oral Daily   Continuous Infusions: . sodium chloride 100 mL/hr at 01/31/18 0413  . cefTRIAXone (ROCEPHIN)  IV Stopped (01/30/18 2245)     Pamella Pert, MD, PhD [facility] Pager [phone]  If 7PM-7AM, please contact  night-coverage [email or web address] Password [facility] 01/31/2018, 11:36 AM

## 2018-02-01 DIAGNOSIS — N179 Acute kidney failure, unspecified: Secondary | ICD-10-CM

## 2018-02-01 DIAGNOSIS — E079 Disorder of thyroid, unspecified: Secondary | ICD-10-CM

## 2018-02-01 DIAGNOSIS — N10 Acute tubulo-interstitial nephritis: Secondary | ICD-10-CM

## 2018-02-01 DIAGNOSIS — I10 Essential (primary) hypertension: Secondary | ICD-10-CM | POA: Diagnosis not present

## 2018-02-01 DIAGNOSIS — A419 Sepsis, unspecified organism: Secondary | ICD-10-CM | POA: Diagnosis not present

## 2018-02-01 DIAGNOSIS — N3000 Acute cystitis without hematuria: Secondary | ICD-10-CM | POA: Diagnosis not present

## 2018-02-01 MED ORDER — CEFPODOXIME PROXETIL 200 MG PO TABS: 200.0000 mg | ORAL_TABLET | Freq: Two times a day (BID) | ORAL | 0 refills | Status: DC

## 2018-02-01 NOTE — Discharge Summary (Signed)
Physician Discharge Summary  Anna Bernard UJW:[id number] DOB: 17-Dec-1950 DOA: 01/28/2018  PCP: Myles Lipps, MD  Admit date: 01/28/2018 Discharge date: 02/01/2018  Admitted From: home Disposition:  home  Recommendations for Outpatient Follow-up:  1. Follow up with PCP in 1-2 weeks  Home Health: none Equipment/Devices: none  Discharge Condition: stable CODE STATUS: Full code Diet recommendation: regular  HPI: Per Dr. Antionette Char, Anna Bernard is a 67 y.o. female with medical history significant for hypothyroidism and hypertension, now presenting to the emergency department with 3 days of lightheadedness, general malaise, and pain in her mid back.  She is not very forthcoming with history, but relates that she has been feeling poorly for at least 3 days, mainly complaining of fatigue and malaise.  She does acknowledge pain in the mid back and right knee.  She has had similar pain previously.  Denies any precipitating event or trauma. ED Course: Upon arrival to the ED, patient is found to be febrile to 38.1 C, saturating well on room air, and with blood pressure 90s over 50s.  Chest x-ray is negative for acute cardiopulmonary disease and UA is suggestive of infection.  Chemistry panel is notable for creatinine 1.83, up from 0.52 on the most recent prior from 2014.  CBC is notable for leukocytosis to 17,000 and a microcytic anemia with hemoglobin of 8.6.  Troponin is normal.  Patient was given a liter of normal saline, 4 mg IV morphine, and 1 g of IV Rocephin in the ED.  Blood pressure remains in the low normal range and the patient will be admitted for ongoing evaluation and management of sepsis secondary to UTI.  Hospital Course: Sepsis due to UTI/pyelonephritis -patient was admitted to the hospital with sepsis in the setting of pyelonephritis.  She was placed on antibiotics with ceftriaxone, she was still febrile for couple days but eventually her fever subsided.  She improved clinically,  feeling close to baseline, her white count is normalized, she is afebrile and will be discharged home in stable condition.  Unfortunately her urine cultures did not speciate, but since she responded well to ceftriaxone will transition to Cefpodoxime for 7 additional days to complete a total of 10-day course. Renal ultrasound without acute findings Hypertension -resume home medications Hypothyroidism -Continue Synthroid Hyperlipidemia -Continue Crestor Acute kidney injury -In the setting of sepsis, creatinine has now normalized.     Discharge Diagnoses:  Principal Problem:   Sepsis secondary to UTI [facility]) Active Problems:   Thyroid disease   HTN (hypertension)   Microcytic anemia   Renal insufficiency     Discharge Instructions   Allergies as of 02/01/2018   No Known Allergies     Medication List    TAKE these medications   aspirin 81 MG tablet Take 81 mg by mouth daily.   cefpodoxime 200 MG tablet Commonly known as:  VANTIN Take 1 tablet (200 mg total) by mouth 2 (two) times daily.   DUEXIS 800-26.6 MG Tabs Generic drug:  Ibuprofen-Famotidine Take 1 tablet by mouth 3 (three) times daily.   FIBERALL Powd Take 2 scoop by mouth daily.   levothyroxine 100 MCG tablet Commonly known as:  SYNTHROID Take 1 tablet (100 mcg total) by mouth daily before breakfast.   multivitamin with minerals Tabs tablet Take 1 tablet by mouth daily.   naproxen sodium 220 MG tablet Commonly known as:  ALEVE Take 220 mg by mouth daily as needed (pain).   Olmesartan-amLODIPine-HCTZ 40-10-25 MG Tabs Commonly known as:  TRIBENZOR Take 1 tablet by mouth daily.   rosuvastatin 10 MG tablet Commonly known as:  CRESTOR Take 1 tablet (10 mg total) by mouth daily.   spironolactone 25 MG tablet Commonly known as:  ALDACTONE Take 1 tablet (25 mg total) by mouth daily.      Follow-up Information    [facility] EMERGENCY DEPARTMENT.   Specialty:  Emergency Medicine Why:   As needed, If symptoms worsen Contact information: [address] [id number] [place] [address] [phone]       Myles Lipps, MD. Schedule an appointment as soon as possible for a visit in 2 week(s).   Specialty:  Family Medicine Contact information: [address] [place] [address] [phone]           Consultations:  None   Procedures/Studies:  Dg Chest 2 View  Result Date: 01/29/2018 CLINICAL DATA:  Acute onset of generalized body aches and fever. EXAM: CHEST - 2 VIEW COMPARISON:  None. FINDINGS: The lungs are well-aerated and clear. There is no evidence of focal opacification, pleural effusion or pneumothorax. The heart is normal in size; the mediastinal contour is within normal limits. No acute osseous abnormalities are seen. IMPRESSION: No acute cardiopulmonary process seen. Electronically Signed   By: Roanna Raider M.D.   On: 01/29/2018 00:57   US Renal  Result Date: 01/29/2018 CLINICAL DATA:  67 year old hypertensive female with sepsis secondary to UTI. Initial encounter. EXAM: RENAL / URINARY TRACT ULTRASOUND COMPLETE COMPARISON:  None. FINDINGS: Right Kidney: Length: 14.3 cm. Echogenicity within normal limits. No mass or hydronephrosis visualized. Left Kidney: Length: 13.5 cm. Echogenicity within normal limits. No mass or hydronephrosis visualized. Bladder: Appears normal for degree of bladder distention. IMPRESSION: Negative renal sonogram. Please note, ultrasound may not be sensitive for detection of/demonstration of pyelonephritis. Electronically Signed   By: Lacy Duverney M.D.   On: 01/29/2018 07:07     Subjective: - no chest pain, shortness of breath, no abdominal pain, nausea or vomiting.   Discharge Exam: Vitals:   02/01/18 0537 02/01/18 0824  BP: (!) 123/58 132/69  Pulse: 75 76  Resp: 17   Temp: 99.7 F (37.6 C) 99.1 F (37.3 C)  SpO2: 99% 99%    General: Pt is alert, awake, not in acute  distress Cardiovascular: RRR, S1/S2 +, no rubs, no gallops Respiratory: CTA bilaterally, no wheezing, no rhonchi Abdominal: Soft, NT, ND, bowel sounds + Extremities: no edema, no cyanosis    The results of significant diagnostics from this hospitalization (including imaging, microbiology, ancillary and laboratory) are listed below for reference.     Microbiology: Recent Results (from the past 240 hour(s))  Culture, blood (Routine X 2) w Reflex to ID Panel     Status: None (Preliminary result)   Collection Time: 01/29/18  5:57 AM  Result Value Ref Range Status   Specimen Description BLOOD RIGHT ANTECUBITAL  Final   Special Requests   Final    BOTTLES DRAWN AEROBIC AND ANAEROBIC Blood Culture results may not be optimal due to an excessive volume of blood received in culture bottles   Culture   Final    NO GROWTH 3 DAYS Performed at [facility] Lab, [address] [place], [place] [address]    Report Status PENDING  Incomplete  Culture, blood (Routine X 2) w Reflex to ID Panel     Status: None (Preliminary result)   Collection Time: 01/29/18  6:00 AM  Result Value Ref Range Status  Specimen Description BLOOD LEFT HAND  Final   Special Requests   Final    BOTTLES DRAWN AEROBIC AND ANAEROBIC Blood Culture adequate volume   Culture   Final    NO GROWTH 3 DAYS Performed at [facility] Lab, [address], [place], [place] [address]    Report Status PENDING  Incomplete  Urine culture     Status: None   Collection Time: 01/29/18  8:00 AM  Result Value Ref Range Status   Specimen Description URINE, CLEAN CATCH  Final   Special Requests NONE  Final   Culture   Final    NO GROWTH Performed at [facility] Lab, [address] [place], [place] [address]    Report Status 01/30/2018 FINAL  Final  MRSA PCR Screening     Status: None   Collection Time: 01/30/18  1:05 AM  Result Value Ref Range Status   MRSA by PCR NEGATIVE NEGATIVE Final    Comment:        The GeneXpert  MRSA Assay (FDA approved for NASAL specimens only), is one component of a comprehensive MRSA colonization surveillance program. It is not intended to diagnose MRSA infection nor to guide or monitor treatment for MRSA infections. Performed at [facility] Lab, [address] [place], [place] [address]      Labs: BNP (last 3 results) No results for input(s): BNP in the last 8760 hours. Basic Metabolic Panel: Recent Labs  Lab 01/28/18 2144 01/29/18 0906 01/30/18 0422 01/31/18 0422  NA 138 137 142 143  K 4.3 3.9 4.0 4.0  CL 105 107 110 111  CO2 GLUCOSE 122* 225* 97 90  BUN 36* 33* 21* 11  CREATININE 1.83* 1.59* 1.03* 0.81  CALCIUM 8.5* 7.8* 8.1* 8.3*   Liver Function Tests: Recent Labs  Lab 01/28/18 2144  AST 28  ALT 22  ALKPHOS 71  BILITOT 0.8  PROT 7.2  ALBUMIN 3.4*   No results for input(s): LIPASE, AMYLASE in the last 168 hours. No results for input(s): AMMONIA in the last 168 hours. CBC: Recent Labs  Lab 01/28/18 2144 01/29/18 0557 01/30/18 0422 01/31/18 0422  WBC 17.0* 14.3* 14.7* 10.4  NEUTROABS 13.5* 12.9*  --   --   HGB 8.6* 7.6* 8.7* 8.6*  HCT 25.8* 23.0* 25.8* 25.7*  MCV 70.9* 70.1* 72.1* 72.2*  PLT 299 293 277 293   Cardiac Enzymes: No results for input(s): CKTOTAL, CKMB, CKMBINDEX, TROPONINI in the last 168 hours. BNP: Invalid input(s): POCBNP CBG: Recent Labs  Lab 01/30/18 1635 01/30/18 2141 01/31/18 0737 01/31/18 1201 01/31/18 1632  GLUCAP 95 127* 83 88 94   D-Dimer No results for input(s): DDIMER in the last 72 hours. Hgb A1c Recent Labs    01/30/18 0422  HGBA1C 6.1*   Lipid Profile No results for input(s): CHOL, HDL, LDLCALC, TRIG, CHOLHDL, LDLDIRECT in the last 72 hours. Thyroid function studies Recent Labs    01/30/18 0422  TSH 3.125   Anemia work up No results for input(s): VITAMINB12, FOLATE, FERRITIN, TIBC, IRON, RETICCTPCT in the last 72 hours. Urinalysis    Component Value Date/Time    COLORURINE AMBER (A) 01/29/2018 0052   APPEARANCEUR CLOUDY (A) 01/29/2018 0052   LABSPEC 1.013 01/29/2018 0052   PHURINE 5.0 01/29/2018 0052   GLUCOSEU NEGATIVE 01/29/2018 0052   HGBUR MODERATE (A) 01/29/2018 0052   BILIRUBINUR NEGATIVE 01/29/2018 0052   BILIRUBINUR negative 08/12/2017 1442   BILIRUBINUR neg 12/23/2014 1208  KETONESUR NEGATIVE 01/29/2018 0052   PROTEINUR 100 (A) 01/29/2018 0052   UROBILINOGEN 0.2 08/12/2017 1442   NITRITE POSITIVE (A) 01/29/2018 0052   LEUKOCYTESUR MODERATE (A) 01/29/2018 0052   Sepsis Labs Invalid input(s): PROCALCITONIN,  WBC,  LACTICIDVEN   Time coordinating discharge: 35 minutes  SIGNED:  Pamella Pert, MD  [facility] 02/01/2018, 11:55 AM Pager [phone]  If 7PM-7AM, please contact night-coverage [email or web address] Password [email or web address]

## 2018-02-03 LAB — CULTURE, BLOOD (ROUTINE X 2)
Culture: NO GROWTH
Culture: NO GROWTH
Special Requests: ADEQUATE

## 2018-02-06 ENCOUNTER — Ambulatory Visit: Payer: 59 | Admitting: Family Medicine

## 2018-02-06 ENCOUNTER — Other Ambulatory Visit: Payer: Self-pay

## 2018-02-06 ENCOUNTER — Encounter: Payer: Self-pay | Admitting: Family Medicine

## 2018-02-06 VITALS — Temp 99.6°F | Ht 61.42 in | Wt 196.6 lb

## 2018-02-06 DIAGNOSIS — Z09 Encounter for follow-up examination after completed treatment for conditions other than malignant neoplasm: Secondary | ICD-10-CM

## 2018-02-06 DIAGNOSIS — E538 Deficiency of other specified B group vitamins: Secondary | ICD-10-CM

## 2018-02-06 DIAGNOSIS — N390 Urinary tract infection, site not specified: Secondary | ICD-10-CM

## 2018-02-06 DIAGNOSIS — D509 Iron deficiency anemia, unspecified: Secondary | ICD-10-CM | POA: Diagnosis not present

## 2018-02-06 DIAGNOSIS — A419 Sepsis, unspecified organism: Secondary | ICD-10-CM

## 2018-02-06 DIAGNOSIS — R42 Dizziness and giddiness: Secondary | ICD-10-CM | POA: Diagnosis not present

## 2018-02-06 LAB — POCT URINALYSIS DIP (MANUAL ENTRY)
Bilirubin, UA: NEGATIVE
Blood, UA: NEGATIVE
Glucose, UA: NEGATIVE mg/dL
Ketones, POC UA: NEGATIVE mg/dL
Leukocytes, UA: NEGATIVE
Nitrite, UA: NEGATIVE
Protein Ur, POC: 30 mg/dL — AB
Spec Grav, UA: 1.015 (ref 1.010–1.025)
Urobilinogen, UA: 0.2 E.U./dL
pH, UA: 5.5 (ref 5.0–8.0)

## 2018-02-06 MED ORDER — VITAMIN B-12 500 MCG PO TABS: 500.0000 ug | ORAL_TABLET | Freq: Every day | ORAL | 2 refills | Status: DC

## 2018-02-06 MED ORDER — FERROUS GLUCONATE 324 (38 FE) MG PO TABS: 324.0000 mg | ORAL_TABLET | Freq: Every day | ORAL | 3 refills | Status: DC

## 2018-02-06 NOTE — Progress Notes (Signed)
5/21/20192:30 PM  MYAH GUYNES [birth year], 67 y.o. female [id number]  Chief Complaint  Patient presents with  . Dizziness    Just released from the hospital, still feeling dizzy, dx with UTI.    HPI:   Patient is a 67 y.o. female with past medical history significant for HTN, prediabetes, HLP and hypothyroidism who presents today for hosp followup  admitted from 5/13 - 16 for sepsis 2/2 UTI, normal renal US Incidental finding of microcystic anemia hgb nader 7.6 MCV 70.1,  Low iron and sat, normal TIBC and ferritin b12 119, normal folate transfused 2u PRBCs  Husband had noticed SOB when she would walk to car coming out from work She reports that she is feeling a bit better, stronger, though still very dizzy She gets dizzy when she gets out of bed or tries to bend over She denies spinning sensation, endorses imbalance Has to hold on to things No falls She denies any abd pain, heartburn, nausea or vomiting Has some mild constipation which she manages with prune juice Denies any black tarry stools Denies any dysuria, hematuria Denies any fever or chills Denies any SOB but has not been very active at all Needs paperwork completed to extend work leave She reports annual normal mammograms Denies being UTD with colon cancer screening Patient is not a vegetarian or vegan  Fall Risk  02/06/2018 08/12/2017 05/23/2017 02/27/2016  Falls in the past year? No No No No     Depression screen [facility] 2/9 02/06/2018 08/12/2017 05/23/2017  Decreased Interest 0 0 0  Down, Depressed, Hopeless 0 0 0  PHQ - 2 Score 0 0 0    No Known Allergies  Prior to Admission medications   Medication Sig Start Date End Date Taking? Authorizing Provider  aspirin 81 MG tablet Take 81 mg by mouth daily.   Yes [provider]  cefpodoxime (VANTIN) 200 MG tablet Take 1 tablet (200 mg total) by mouth 2 (two) times daily. 02/01/18  Yes Gherghe, Daylene Katayama, MD  Fiber-Vitamins-Minerals [facility]) POWD Take 2  scoop by mouth daily.   Yes [provider]  Ibuprofen-Famotidine (DUEXIS) 800-26.6 MG TABS Take 1 tablet by mouth 3 (three) times daily.   Yes [provider]  levothyroxine (SYNTHROID) 100 MCG tablet Take 1 tablet (100 mcg total) by mouth daily before breakfast. 05/23/17  Yes Sagardia, Eilleen Kempf, MD  Multiple Vitamin (MULTIVITAMIN WITH MINERALS) TABS Take 1 tablet by mouth daily.   Yes [provider]  naproxen sodium (ANAPROX) 220 MG tablet Take 220 mg by mouth daily as needed (pain).    Yes [provider]  Olmesartan-amLODIPine-HCTZ (TRIBENZOR) 40-10-25 MG TABS Take 1 tablet by mouth daily. 01/01/18  Yes Sagardia, Eilleen Kempf, MD  rosuvastatin (CRESTOR) 10 MG tablet Take 1 tablet (10 mg total) by mouth daily. 11/16/13  Yes Carmelina Dane, MD  spironolactone (ALDACTONE) 25 MG tablet Take 1 tablet (25 mg total) by mouth daily. 11/16/13  Yes Carmelina Dane, MD    Past Medical History:  Diagnosis Date  . Hypertension   . Thyroid disease     Past Surgical History:  Procedure Laterality Date  . ABDOMINAL HYSTERECTOMY    . SHOULDER SURGERY      Social History   Tobacco Use  . Smoking status: Never Smoker  . Smokeless tobacco: Never Used  Substance Use Topics  . Alcohol use: No    Alcohol/week: 0.0 oz    History reviewed. No pertinent family history.  ROS Per  hpi  OBJECTIVE:  Temperature 99.6 F (37.6 C), temperature source Oral, height 5' 1.42" (1.56 m), weight 196 lb 9.6 oz (89.2 kg), SpO2 100 %.  BP lying 110/60, standing @ 110/60, staning @ 3 min 112/60  Physical Exam  Constitutional: She is oriented to person, place, and time. She appears well-developed and well-nourished.  HENT:  Head: Normocephalic and atraumatic.  Mouth/Throat: Oropharynx is clear and moist. No oropharyngeal exudate.  Eyes: Pupils are equal, round, and reactive to light. EOM are normal. No scleral icterus.  Neck: Neck supple.  Cardiovascular:  Normal rate, regular rhythm and normal heart sounds. Exam reveals no gallop and no friction rub.  No murmur heard. Pulmonary/Chest: Effort normal and breath sounds normal. She has no wheezes. She has no rales.  Abdominal: Soft. Bowel sounds are normal. There is no hepatosplenomegaly. There is tenderness in the right lower quadrant. There is no rebound, no guarding and no tenderness at McBurney's point.  Musculoskeletal: She exhibits no edema.  Neurological: She is alert and oriented to person, place, and time.  Becomes dizzy with lying back on exam table  Skin: Skin is warm and dry.  Nursing note and vitals reviewed.     Results for orders placed or performed in visit on 02/06/18 (from the past 24 hour(s))  POCT urinalysis dipstick     Status: Abnormal   Collection Time: 02/06/18  2:35 PM  Result Value Ref Range   Color, UA yellow yellow   Clarity, UA clear clear   Glucose, UA negative negative mg/dL   Bilirubin, UA negative negative   Ketones, POC UA negative negative mg/dL   Spec Grav, UA 1.610 9.604 - 1.025   Blood, UA negative negative   pH, UA 5.5 5.0 - 8.0   Protein Ur, POC =30 (A) negative mg/dL   Urobilinogen, UA 0.2 0.2 or 1.0 E.U./dL   Nitrite, UA Negative Negative   Leukocytes, UA Negative Negative      ASSESSMENT and PLAN  1. Encounter for examination following treatment at hospital Overall patient slowly recovering. UA neg N/L. Instructed to complete abx. Concerned for significant degree of anemia. Seems to be cause of SOB and dizziness. Unclear etiology. Referring to GI for initial eval and treatment. Starting her on iron supplementation. Discussed r/se/b . Extending leave. wil fu with me in 7-10 days. - POCT urinalysis dipstick  2. Sepsis secondary to UTI [facility]) - Basic metabolic panel  3. Microcytic anemia - CBC - Ambulatory referral to Gastroenterology  4. Dizziness  5. Vitamin B12 deficiency Anemia microcystic, starting oral supplementation.  Other  orders - ferrous gluconate (FERGON) 324 MG tablet; Take 1 tablet (324 mg total) by mouth daily with breakfast. - vitamin B-12 (CYANOCOBALAMIN) 500 MCG tablet; Take 1 tablet (500 mcg total) by mouth daily.  Return in about 9 days (around 02/15/2018).    Myles Lipps, MD Primary Care at [facility] [address] [place], [place] [address] Ph.  [phone] Fax [phone]

## 2018-02-06 NOTE — Patient Instructions (Addendum)
    IF you received an x-ray today, you will receive an invoice from [facility]. Please contact [facility] at [phone] with questions or concerns regarding your invoice.   IF you received labwork today, you will receive an invoice from [facility]. Please contact [facility] at [phone] with questions or concerns regarding your invoice.   Our billing staff will not be able to assist you with questions regarding bills from these companies.  You will be contacted with the lab results as soon as they are available. The fastest way to get your results is to activate your My Chart account. Instructions are located on the last page of this paperwork. If you have not heard from us regarding the results in 2 weeks, please contact this office.     Iron Deficiency Anemia, Adult Iron-deficiency anemia is when you have a low amount of red blood cells or hemoglobin. This happens because you have too little iron in your body. Hemoglobin carries oxygen to parts of the body. Anemia can cause your body to not get enough oxygen. It may or may not cause symptoms. Follow these instructions at home: Medicines  Take over-the-counter and prescription medicines only as told by your doctor. This includes iron pills (supplements) and vitamins.  If you cannot handle taking iron pills by mouth, ask your doctor about getting iron through: ? A vein (intravenously). ? A shot (injection) into a muscle.  Take iron pills when your stomach is empty. If you cannot handle this, take them with food.  Do not drink milk or take antacids at the same time as your iron pills.  To prevent trouble pooping (constipation), eat fiber or take medicine (stool softener) as told by your doctor. Eating and drinking  Talk with your doctor before changing the foods you eat. He or she may tell you to eat foods that have a lot of iron, such as: ? Liver. ? Lowfat (lean) beef. ? Breads and cereals that have iron  added to them (fortified breads and cereals). ? Eggs. ? Dried fruit. ? Dark green, leafy vegetables.  Drink enough fluid to keep your pee (urine) clear or pale yellow.  Eat fresh fruits and vegetables that are high in vitamin C. They help your body to use iron. Foods with a lot of vitamin C include: ? Oranges. ? Peppers. ? Tomatoes. ? Mangoes. General instructions  Return to your normal activities as told by your doctor. Ask your doctor what activities are safe for you.  Keep yourself clean, and keep things clean around you (your surroundings). Anemia can make you get sick more easily.  Keep all follow-up visits as told by your doctor. This is important. Contact a doctor if:  You feel sick to your stomach (nauseous).  You throw up (vomit).  You feel weak.  You are sweating for no clear reason.  You have trouble pooping, such as: ? Pooping (having a bowel movement) less than 3 times a week. ? Straining to poop. ? Having poop that is hard, dry, or larger than normal. ? Feeling full or bloated. ? Pain in the lower belly. ? Not feeling better after pooping. Get help right away if:  You pass out (faint). If this happens, do not drive yourself to the hospital. Call your local emergency services (911 in the [place]  You have chest pain.  You have shortness of breath that: ? Is very bad. ? Gets worse with physical activity.  You have a fast heartbeat.  You   get light-headed when getting up from sitting or lying down. This information is not intended to replace advice given to you by your health care provider. Make sure you discuss any questions you have with your health care provider. Document Released: 10/08/2010 Document Revised: 05/25/2016 Document Reviewed: 05/25/2016 Elsevier Interactive Patient Education  2017 [employer] Inc[employer]  

## 2018-02-07 LAB — BASIC METABOLIC PANEL
BUN/Creatinine Ratio: 15 (ref 12–28)
BUN: 17 mg/dL (ref 8–27)
CO2: 24 mmol/L (ref 20–29)
Calcium: 9.3 mg/dL (ref 8.7–10.3)
Chloride: 101 mmol/L (ref 96–106)
Creatinine, Ser: 1.12 mg/dL — ABNORMAL HIGH (ref 0.57–1.00)
GFR calc Af Amer: 60 mL/min/{1.73_m2} (ref >59)
GFR calc non Af Amer: 52 mL/min/{1.73_m2} — ABNORMAL LOW (ref >59)
Glucose: 111 mg/dL — ABNORMAL HIGH (ref 65–99)
Potassium: 4.8 mmol/L (ref 3.5–5.2)
Sodium: 139 mmol/L (ref 134–144)

## 2018-02-07 LAB — CBC
Hematocrit: 28.5 % — ABNORMAL LOW (ref 34.0–46.6)
Hemoglobin: 9.1 g/dL — ABNORMAL LOW (ref 11.1–15.9)
MCH: 23.9 pg — ABNORMAL LOW (ref 26.6–33.0)
MCHC: 31.9 g/dL (ref 31.5–35.7)
MCV: 75 fL — ABNORMAL LOW (ref 79–97)
Platelets: 542 10*3/uL — ABNORMAL HIGH (ref 150–450)
RBC: 3.81 x10E6/uL (ref 3.77–5.28)
RDW: 18.1 % — ABNORMAL HIGH (ref 12.3–15.4)
WBC: 7.7 10*3/uL (ref 3.4–10.8)

## 2018-02-15 ENCOUNTER — Telehealth: Payer: Self-pay | Admitting: Family Medicine

## 2018-02-15 ENCOUNTER — Ambulatory Visit: Payer: 59 | Admitting: Family Medicine

## 2018-02-15 ENCOUNTER — Encounter: Payer: Self-pay | Admitting: Family Medicine

## 2018-02-15 ENCOUNTER — Other Ambulatory Visit: Payer: Self-pay

## 2018-02-15 VITALS — BP 118/66 | HR 87 | Temp 98.9°F | Resp 16 | Ht 61.5 in | Wt 198.0 lb

## 2018-02-15 DIAGNOSIS — D509 Iron deficiency anemia, unspecified: Secondary | ICD-10-CM | POA: Diagnosis not present

## 2018-02-15 DIAGNOSIS — R42 Dizziness and giddiness: Secondary | ICD-10-CM | POA: Diagnosis not present

## 2018-02-15 DIAGNOSIS — I951 Orthostatic hypotension: Secondary | ICD-10-CM | POA: Diagnosis not present

## 2018-02-15 LAB — POCT CBC
Granulocyte percent: 67.4 %G (ref 37–80)
HCT, POC: 30.8 % — AB (ref 37.7–47.9)
Hemoglobin: 9.8 g/dL — AB (ref 12.2–16.2)
Lymph, poc: 1.9 (ref 0.6–3.4)
MCH, POC: 24 pg — AB (ref 27–31.2)
MCHC: 31.9 g/dL (ref 31.8–35.4)
MCV: 75.2 fL — AB (ref 80–97)
MID (cbc): 0.3 (ref 0–0.9)
MPV: 7.8 fL (ref 0–99.8)
POC Granulocyte: 4.7 (ref 2–6.9)
POC LYMPH PERCENT: 28.1 %L (ref 10–50)
POC MID %: 4.5 %M (ref 0–12)
Platelet Count, POC: 528 10*3/uL — AB (ref 142–424)
RBC: 4.1 M/uL (ref 4.04–5.48)
RDW, POC: 18 %
WBC: 6.9 10*3/uL (ref 4.6–10.2)

## 2018-02-15 NOTE — Telephone Encounter (Signed)
Copied from CRM [id number]. Topic: Quick Communication - See Telephone Encounter >> Feb 15, 2018  1:25 PM Arlyss Gandy, NT wrote: CRM for notification. See Telephone encounter for: 02/15/18. Pt is wanting the physician to fill out a handicap placard form for her.

## 2018-02-15 NOTE — Patient Instructions (Addendum)
1. Please stop taking spironolactone. Push fluids.   IF you received an x-ray today, you will receive an invoice from [facility]. Please contact [facility] at [phone] with questions or concerns regarding your invoice.   IF you received labwork today, you will receive an invoice from [place]. Please contact [facility] at [phone] with questions or concerns regarding your invoice.   Our billing staff will not be able to assist you with questions regarding bills from these companies.  You will be contacted with the lab results as soon as they are available. The fastest way to get your results is to activate your My Chart account. Instructions are located on the last page of this paperwork. If you have not heard from [place] regarding the results in 2 weeks, please contact this office.     Orthostatic Hypotension Orthostatic hypotension is a sudden drop in blood pressure that happens when you quickly change positions, such as when you get up from a seated or lying position. Blood pressure is a measurement of how strongly, or weakly, your blood is pressing against the walls of your arteries. Arteries are blood vessels that carry blood from your heart throughout your body. When blood pressure is too low, you may not get enough blood to your brain or to the rest of your organs. This can cause weakness, light-headedness, rapid heartbeat, and fainting. This can last for just a few seconds or for up to a few minutes. Orthostatic hypotension is usually not a serious problem. However, if it happens frequently or gets worse, it may be a sign of something more serious. What are the causes? This condition may be caused by:  Sudden changes in posture, such as standing up quickly after you have been sitting or lying down.  Blood loss.  Loss of body fluids (dehydration).  Heart problems.  Hormone (endocrine) problems.  Pregnancy.  Severe infection.  Lack of certain  nutrients.  Severe allergic reactions (anaphylaxis).  Certain medicines, such as blood pressure medicine or medicines that make the body lose excess fluids (diuretics). Sometimes, this condition can be caused by not taking medicine as directed, such as taking too much of a certain medicine.  What increases the risk? Certain factors can make you more likely to develop orthostatic hypotension, including:  Age. Risk increases as you get older.  Conditions that affect the heart or the central nervous system.  Taking certain medicines, such as blood pressure medicine or diuretics.  Being pregnant.  What are the signs or symptoms? Symptoms of this condition may include:  Weakness.  Light-headedness.  Dizziness.  Blurred vision.  Fatigue.  Rapid heartbeat.  Fainting, in severe cases.  How is this diagnosed? This condition is diagnosed based on:  Your medical history.  Your symptoms.  Your blood pressure measurement. Your health care provider will check your blood pressure when you are: ? Lying down. ? Sitting. ? Standing.  A blood pressure reading is recorded as two numbers, such as "120 over 80" (or 120/80). The first ("top") number is called the systolic pressure. It is a measure of the pressure in your arteries as your heart beats. The second ("bottom") number is called the diastolic pressure. It is a measure of the pressure in your arteries when your heart relaxes between beats. Blood pressure is measured in a unit called mm Hg. Healthy blood pressure for adults is 120/80. If your blood pressure is below 90/60, you may be diagnosed with hypotension. Other information or tests that may be  used to diagnose orthostatic hypotension include:  Your other vital signs, such as your heart rate and temperature.  Blood tests.  Tilt table test. For this test, you will be safely secured to a table that moves you from a lying position to an upright position. Your heart rhythm and  blood pressure will be monitored during the test.  How is this treated? Treatment for this condition may include:  Changing your diet. This may involve eating more salt (sodium) or drinking more water.  Taking medicines to raise your blood pressure.  Changing the dosage of certain medicines you are taking that might be lowering your blood pressure.  Wearing compression stockings. These stockings help to prevent blood clots and reduce swelling in your legs.  In some cases, you may need to go to the hospital for:  Fluid replacement. This means you will receive fluids through an IV tube.  Blood replacement. This means you will receive donated blood through an IV tube (transfusion).  Treating an infection or heart problems, if this applies.  Monitoring. You may need to be monitored while medicines that you are taking wear off.  Follow these instructions at home: Eating and drinking   Drink enough fluid to keep your urine clear or pale yellow.  Eat a healthy diet and follow instructions from your health care provider about eating or drinking restrictions. A healthy diet includes: ? Fresh fruits and vegetables. ? Whole grains. ? Lean meats. ? Low-fat dairy products.  Eat extra salt only as directed. Do not add extra salt to your diet unless your health care provider told you to do that.  Eat frequent, small meals.  Avoid standing up suddenly after eating. Medicines  Take over-the-counter and prescription medicines only as told by your health care provider. ? Follow instructions from your health care provider about changing the dosage of your current medicines, if this applies. ? Do not stop or adjust any of your medicines on your own. General instructions  Wear compression stockings as told by your health care provider.  Get up slowly from lying down or sitting positions. This gives your blood pressure a chance to adjust.  Avoid hot showers and excessive heat as directed  by your health care provider.  Return to your normal activities as told by your health care provider. Ask your health care provider what activities are safe for you.  Do not use any products that contain nicotine or tobacco, such as cigarettes and e-cigarettes. If you need help quitting, ask your health care provider.  Keep all follow-up visits as told by your health care provider. This is important. Contact a health care provider if:  You vomit.  You have diarrhea.  You have a fever for more than 2-3 days.  You feel more thirsty than usual.  You feel weak and tired. Get help right away if:  You have chest pain.  You have a fast or irregular heartbeat.  You develop numbness in any part of your body.  You cannot move your arms or your legs.  You have trouble speaking.  You become sweaty or feel lightheaded.  You faint.  You feel short of breath.  You have trouble staying awake.  You feel confused. This information is not intended to replace advice given to you by your health care provider. Make sure you discuss any questions you have with your health care provider. Document Released: 08/26/2002 Document Revised: 05/24/2016 Document Reviewed: 02/26/2016 Elsevier Interactive Patient Education  2018 [employer].

## 2018-02-15 NOTE — Progress Notes (Signed)
5/30/201911:07 AM  [name] Oct 18, 1950, 66 y.o. female [id number]  Chief Complaint  Patient presents with  . Dizziness    f/u from 02/06/18, per pt states that she is still dizzy, and that [facility] nurse advised her to have meds decreased this may be causing the dizziness    HPI:   Patient is a 66 y.o. female with past medical history significant for HTN, prediabetes, HLP and hypothyroidism   who presents today for follow-up in dizziness  admitted from 5/13 - 16 for sepsis 2/2 UTI, Patient reports that she is slowly better Dizziness still present, when she stands up, about 25% better Still holds on to walls, chairs, uses walker Wondering if it is medication related  Requesting temporary disability parking placard Requesting extension of FMLA Took all her BP meds this morning  Fall Risk  02/15/2018 02/06/2018 08/12/2017 05/23/2017 02/27/2016  Falls in the past year? Yes No No No No     Depression screen [facility] 2/9 02/15/2018 02/06/2018 08/12/2017  Decreased Interest 0 0 0  Down, Depressed, Hopeless 0 0 0  PHQ - 2 Score 0 0 0    No Known Allergies  Prior to Admission medications   Medication Sig Start Date End Date Taking? Authorizing Provider  aspirin 81 MG tablet Take 81 mg by mouth daily.   Yes [provider]  ferrous gluconate (FERGON) 324 MG tablet Take 1 tablet (324 mg total) by mouth daily with breakfast. 02/06/18  Yes Myles Lipps, MD  levothyroxine (SYNTHROID) 100 MCG tablet Take 1 tablet (100 mcg total) by mouth daily before breakfast. 05/23/17  Yes Sagardia, Eilleen Kempf, MD  Multiple Vitamin (MULTIVITAMIN WITH MINERALS) TABS Take 1 tablet by mouth daily.   Yes [provider]  naproxen sodium (ANAPROX) 220 MG tablet Take 220 mg by mouth daily as needed (pain).    Yes [provider]  Olmesartan-amLODIPine-HCTZ (TRIBENZOR) 40-10-25 MG TABS Take 1 tablet by mouth daily. 01/01/18  Yes Sagardia, Eilleen Kempf, MD  rosuvastatin  (CRESTOR) 10 MG tablet Take 1 tablet (10 mg total) by mouth daily. 11/16/13  Yes Carmelina Dane, MD  spironolactone (ALDACTONE) 25 MG tablet Take 1 tablet (25 mg total) by mouth daily. 11/16/13  Yes Carmelina Dane, MD  vitamin B-12 (CYANOCOBALAMIN) 500 MCG tablet Take 1 tablet (500 mcg total) by mouth daily. 02/06/18  Yes Myles Lipps, MD  Fiber-Vitamins-Minerals [facility]) POWD Take 2 scoop by mouth daily.    [provider]  Ibuprofen-Famotidine (DUEXIS) 800-26.6 MG TABS Take 1 tablet by mouth 3 (three) times daily.    [provider]    Past Medical History:  Diagnosis Date  . Anemia, unspecified   . Hyperlipidemia, unspecified   . Hypertension   . Hypothyroidism, unspecified   . Pre-diabetes   . Vitamin B12 deficiency     Past Surgical History:  Procedure Laterality Date  . ABDOMINAL HYSTERECTOMY    . SHOULDER SURGERY      Social History   Tobacco Use  . Smoking status: Never Smoker  . Smokeless tobacco: Never Used  Substance Use Topics  . Alcohol use: No    Alcohol/week: 0.0 oz    No family history on file.  Review of Systems  Constitutional: Negative for chills and fever.  Respiratory: Negative for cough and shortness of breath.   Cardiovascular: Negative for chest pain, palpitations and leg swelling.  Gastrointestinal: Negative for abdominal pain, nausea and vomiting.  Neurological: Positive for dizziness and  weakness.     OBJECTIVE:  Blood pressure 118/66, pulse 87, temperature 98.9 F (37.2 C), temperature source Oral, resp. rate 16, height 5' 1.5" (1.562 m), weight 198 lb (89.8 kg), SpO2 99 %.  orthostatics Lying: 102/67, 79 Sitting: 99/64, 84 Standing: 86/59, 92  Physical Exam  Constitutional: She is oriented to person, place, and time. She appears well-developed and well-nourished.  HENT:  Head: Normocephalic and atraumatic.  Mouth/Throat: Oropharynx is clear and moist. No oropharyngeal exudate.  Eyes: Pupils are  equal, round, and reactive to light. EOM are normal. No scleral icterus.  Neck: Neck supple.  Cardiovascular: Normal rate, regular rhythm and normal heart sounds. Exam reveals no gallop and no friction rub.  No murmur heard. Pulmonary/Chest: Effort normal and breath sounds normal. She has no wheezes. She has no rales.  Musculoskeletal: She exhibits no edema.  Neurological: She is alert and oriented to person, place, and time. She has normal strength and normal reflexes. No cranial nerve deficit. She displays a negative Romberg sign. Gait (using walker) abnormal. Coordination normal.  Neg hill-pike When stood up from bed for romberg, swaying, however romberg and pronator drift test themselves neg  Skin: Skin is warm and dry.  Psychiatric: She has a normal mood and affect.  Nursing note and vitals reviewed.   Results for orders placed or performed in visit on 02/15/18 (from the past 24 hour(s))  POCT CBC     Status: Abnormal   Collection Time: 02/15/18 11:45 AM  Result Value Ref Range   WBC 6.9 4.6 - 10.2 K/uL   Lymph, poc 1.9 0.6 - 3.4   POC LYMPH PERCENT 28.1 10 - 50 %L   MID (cbc) 0.3 0 - 0.9   POC MID % 4.5 0 - 12 %M   POC Granulocyte 4.7 2 - 6.9   Granulocyte percent 67.4 37 - 80 %G   RBC 4.10 4.04 - 5.48 M/uL   Hemoglobin 9.8 (A) 12.2 - 16.2 g/dL   HCT, POC 16.1 (A) 09.6 - 47.9 %   MCV 75.2 (A) 80 - 97 fL   MCH, POC 24.0 (A) 27 - 31.2 pg   MCHC 31.9 31.8 - 35.4 g/dL   RDW, POC 04.5 %   Platelet Count, POC 528 (A) 142 - 424 K/uL   MPV 7.8 0 - 99.8 fL    My interpretation of EKG:  NSR, HR 78, no st changes  ASSESSMENT and PLAN  1. Orthostatic hypotension Hold off spironolactone. Push fluids. Will extend work leave for 2 weeks. Consider PT for strength. Husband to bring in paperwork for disability placard.   2. Dizziness - Orthostatic vital signs - EKG 12-Lead  3. Microcytic anemia Stable, pending referral to GI - POCT CBC  Return in about 2 weeks (around  03/01/2018). with me, however if still being dizzy, follow-up sooner with any of my collegues    Myles Lipps, MD Primary Care at [facility] [address] [place], [place] [address] Ph.  [phone] Fax [phone]

## 2018-02-20 ENCOUNTER — Encounter: Payer: Self-pay | Admitting: Internal Medicine

## 2018-03-02 ENCOUNTER — Encounter: Payer: Self-pay | Admitting: Family Medicine

## 2018-03-02 ENCOUNTER — Other Ambulatory Visit: Payer: Self-pay

## 2018-03-02 ENCOUNTER — Ambulatory Visit (INDEPENDENT_AMBULATORY_CARE_PROVIDER_SITE_OTHER): Payer: 59 | Admitting: Family Medicine

## 2018-03-02 ENCOUNTER — Ambulatory Visit: Payer: 59 | Admitting: Family Medicine

## 2018-03-02 VITALS — BP 108/74 | HR 83 | Temp 99.0°F | Ht 61.34 in | Wt 200.0 lb

## 2018-03-02 DIAGNOSIS — K5900 Constipation, unspecified: Secondary | ICD-10-CM | POA: Diagnosis not present

## 2018-03-02 DIAGNOSIS — D509 Iron deficiency anemia, unspecified: Secondary | ICD-10-CM | POA: Diagnosis not present

## 2018-03-02 DIAGNOSIS — I951 Orthostatic hypotension: Secondary | ICD-10-CM | POA: Diagnosis not present

## 2018-03-02 MED ORDER — HYDROCHLOROTHIAZIDE 25 MG PO TABS: 12.5000 mg | ORAL_TABLET | Freq: Every day | ORAL | 0 refills | Status: DC

## 2018-03-02 MED ORDER — POLYETHYLENE GLYCOL 3350 17 GM/SCOOP PO POWD: 17.0000 g | Freq: Every day | ORAL | 1 refills | Status: DC

## 2018-03-02 MED ORDER — OLMESARTAN MEDOXOMIL 40 MG PO TABS: 40.0000 mg | ORAL_TABLET | Freq: Every day | ORAL | 0 refills | Status: DC

## 2018-03-02 MED ORDER — AMLODIPINE BESYLATE 10 MG PO TABS: 10.0000 mg | ORAL_TABLET | Freq: Every day | ORAL | 0 refills | Status: DC

## 2018-03-02 NOTE — Patient Instructions (Addendum)
  1. Take amlodipine at bedtime 2. Check BP before you take morning meds and about 1/2 hour later, bring me log at next vist   IF you received an x-ray today, you will receive an invoice from [facility]. Please contact [facility] at [phone] with questions or concerns regarding your invoice.   IF you received labwork today, you will receive an invoice from [facility]. Please contact [facility] at [phone] with questions or concerns regarding your invoice.   Our billing staff will not be able to assist you with questions regarding bills from these companies.  You will be contacted with the lab results as soon as they are available. The fastest way to get your results is to activate your My Chart account. Instructions are located on the last page of this paperwork. If you have not heard from us regarding the results in 2 weeks, please contact this office.

## 2018-03-02 NOTE — Progress Notes (Signed)
6/14/201911:55 AM  Anna Bernard 16-Anna-1952, 67 y.o. female [id number]  Chief Complaint  Patient presents with  . Follow-up    orthostatic hyptension. [facility] nurse is asking that the olmesartan, amolodpine and hctz be separated. May be a factor to the dizziness.    HPI:   Patient is a 67 y.o. female with past medical history significant for HTN, prediabetes, HLP and hypothyroidism  who presents today for follow-up in dizziness  She stopped spironolactone as advised last visit She however continues to have orthostatic hypotension and needs to hold onto walls or use walker She has not had any falls Her nurse advised separating her remaining combo pill (3 meds) She has an appt with GI in aug (for anemia), she reports intermittent melena She has been experiencing some constipation since she started iron supplement She has been doing prune juice which work some, it got uncomfortable enough that she took mag citrate, now better  Fall Risk  03/02/2018 02/15/2018 02/06/2018 08/12/2017 05/23/2017  Falls in the past year? No Yes No No No     Depression screen [facility] 2/9 03/02/2018 02/15/2018 02/06/2018  Decreased Interest 0 0 0  Down, Depressed, Hopeless 0 0 0  PHQ - 2 Score 0 0 0    No Known Allergies  Prior to Admission medications   Medication Sig Start Date End Date Taking? Authorizing Provider  aspirin 81 MG tablet Take 81 mg by mouth daily.   Yes [provider]  ferrous gluconate (FERGON) 324 MG tablet Take 1 tablet (324 mg total) by mouth daily with breakfast. 02/06/18  Yes Myles Lipps, MD  Fiber-Vitamins-Minerals [facility]) POWD Take 2 scoop by mouth daily.   Yes [provider]  Ibuprofen-Famotidine (DUEXIS) 800-26.6 MG TABS Take 1 tablet by mouth 3 (three) times daily.   Yes [provider]  levothyroxine (SYNTHROID) 100 MCG tablet Take 1 tablet (100 mcg total) by mouth daily before breakfast. 05/23/17  Yes Sagardia, Eilleen Kempf, MD  Multiple Vitamin  (MULTIVITAMIN WITH MINERALS) TABS Take 1 tablet by mouth daily.   Yes [provider]  naproxen sodium (ANAPROX) 220 MG tablet Take 220 mg by mouth daily as needed (pain).    Yes [provider]  Olmesartan-amLODIPine-HCTZ (TRIBENZOR) 40-10-25 MG TABS Take 1 tablet by mouth daily. 01/01/18  Yes Sagardia, Eilleen Kempf, MD  rosuvastatin (CRESTOR) 10 MG tablet Take 1 tablet (10 mg total) by mouth daily. 11/16/13  Yes Carmelina Dane, MD  vitamin B-12 (CYANOCOBALAMIN) 500 MCG tablet Take 1 tablet (500 mcg total) by mouth daily. 02/06/18  Yes Myles Lipps, MD    Past Medical History:  Diagnosis Date  . Anemia, unspecified   . Hyperlipidemia, unspecified   . Hypertension   . Hypothyroidism, unspecified   . Pre-diabetes   . Vitamin B12 deficiency     Past Surgical History:  Procedure Laterality Date  . ABDOMINAL HYSTERECTOMY    . SHOULDER SURGERY      Social History   Tobacco Use  . Smoking status: Never Smoker  . Smokeless tobacco: Never Used  Substance Use Topics  . Alcohol use: No    Alcohol/week: 0.0 oz    History reviewed. No pertinent family history.  Review of Systems  Constitutional: Negative for chills and fever.  Respiratory: Negative for cough and shortness of breath.   Cardiovascular: Negative for chest pain, palpitations and leg swelling.  Gastrointestinal: Positive for constipation and melena. Negative for abdominal pain, blood in stool, nausea and vomiting.  Musculoskeletal: Negative for falls.  Neurological: Positive for dizziness.    OBJECTIVE:  Blood pressure 108/74, pulse 83, temperature 99 F (37.2 C), temperature source Oral, height 5' 1.34" (1.558 m), weight 200 lb (90.7 kg), SpO2 97 %.  Physical Exam  Constitutional: She is oriented to person, place, and time. She appears well-developed and well-nourished.  HENT:  Head: Normocephalic and atraumatic.  Mouth/Throat: Oropharynx is clear and moist. No oropharyngeal exudate.    Eyes: Pupils are equal, round, and reactive to light. EOM are normal. No scleral icterus.  Neck: Neck supple.  Cardiovascular: Normal rate, regular rhythm and normal heart sounds. Exam reveals no gallop and no friction rub.  No murmur heard. Pulmonary/Chest: Effort normal and breath sounds normal. She has no wheezes. She has no rales.  Musculoskeletal: She exhibits no edema.  Neurological: She is alert and oriented to person, place, and time.  Using a walker  Skin: Skin is warm and dry.  Psychiatric: She has a normal mood and affect.  Nursing note and vitals reviewed.    ASSESSMENT and PLAN  1. Microcytic anemia Will investigate if can be seen sooner.   2. Orthostatic hypotension Decreased HCTZ to half dose. Discussed taking amlodipine at bedtime, olmesartan and HCTZ in am. Start checking home BP. Fall precautions discussed. Extended work leave as patient still unsteady in her feet and using a walker.   3. Constipation, unspecified constipation type Discussed supportive measures. Adding miralax. RTC precautions reviewed.  Other orders - olmesartan (BENICAR) 40 MG tablet; Take 1 tablet (40 mg total) by mouth daily. - hydrochlorothiazide (HYDRODIURIL) 25 MG tablet; Take 0.5 tablets (12.5 mg total) by mouth daily. - amLODipine (NORVASC) 10 MG tablet; Take 1 tablet (10 mg total) by mouth daily. - polyethylene glycol powder (GLYCOLAX/MIRALAX) powder; Take 17 g by mouth daily.  Return in about 1 week (around 03/09/2018).    Myles LippsIrma M Santiago, MD Primary Care at [facility] [address] [place], [place] [address] Ph.  [phone] Fax [phone]

## 2018-03-09 ENCOUNTER — Encounter: Payer: Self-pay | Admitting: Family Medicine

## 2018-03-09 ENCOUNTER — Ambulatory Visit: Payer: 59 | Admitting: Family Medicine

## 2018-03-09 ENCOUNTER — Other Ambulatory Visit: Payer: Self-pay

## 2018-03-09 VITALS — Temp 98.7°F | Ht 61.34 in | Wt 202.8 lb

## 2018-03-09 DIAGNOSIS — I951 Orthostatic hypotension: Secondary | ICD-10-CM | POA: Diagnosis not present

## 2018-03-09 DIAGNOSIS — R42 Dizziness and giddiness: Secondary | ICD-10-CM

## 2018-03-09 DIAGNOSIS — R531 Weakness: Secondary | ICD-10-CM

## 2018-03-09 DIAGNOSIS — D509 Iron deficiency anemia, unspecified: Secondary | ICD-10-CM

## 2018-03-09 DIAGNOSIS — N289 Disorder of kidney and ureter, unspecified: Secondary | ICD-10-CM

## 2018-03-09 MED ORDER — HYDROCHLOROTHIAZIDE 12.5 MG PO TABS: 12.5000 mg | ORAL_TABLET | Freq: Every day | ORAL | 0 refills | Status: DC

## 2018-03-09 MED ORDER — AMLODIPINE BESYLATE 5 MG PO TABS: 5.0000 mg | ORAL_TABLET | Freq: Every day | ORAL | 0 refills | Status: DC

## 2018-03-09 MED ORDER — OLMESARTAN MEDOXOMIL 20 MG PO TABS: 20.0000 mg | ORAL_TABLET | Freq: Every day | ORAL | 0 refills | Status: DC

## 2018-03-09 NOTE — Patient Instructions (Addendum)
[facility] [address] [phone] Dr Elnoria HowardHung, Gastroenterologist Wed June 26th at 2:00pm arrival time [employer] list of medications, picture ID and insurance card   1. Please take 1/2 of olmesartan and 1/2 amlodipine. Continue to check BP.    IF you received an x-ray today, you will receive an invoice from [facility]. Please contact [facility] at [phone] with questions or concerns regarding your invoice.   IF you received labwork today, you will receive an invoice from [facility]. Please contact [facility] at [phone] with questions or concerns regarding your invoice.   Our billing staff will not be able to assist you with questions regarding bills from these companies.  You will be contacted with the lab results as soon as they are available. The fastest way to get your results is to activate your My Chart account. Instructions are located on the last page of this paperwork. If you have not heard from us regarding the results in 2 weeks, please contact this office.

## 2018-03-09 NOTE — Progress Notes (Signed)
6/21/201911:32 AM  Anna FiremanMary A Bernard 04/16/1951, 67 y.o. female [id number]  Chief Complaint  Patient presents with  . Follow-up    orthostatic hypotension    HPI:   Patient is a 67 y.o. female with past medical history significant for HTN, pre-diabetes, HLP and hypothyroidism who presents today for followup on dizziness secondary to persistent orthostatic hypotension  She has continued to adjust medications as advised She continues to have dizziness with standing up to quickly, bending over, continues to challenge herself to walk wo walker She still will sway into walls However she feels that it is getting better  She also found original prescription of BP meds before going into the hospital in may for urosepsis olmesartan 20mg  hctz 12.5mg  Amlodipine 5mg   She is currently taking olmesartan 40mg  hctz 12.5mg  Amlodipine 10mg   Has not heard regarding new GI appt  Fall Risk  03/09/2018 03/02/2018 02/15/2018 02/06/2018 08/12/2017  Falls in the past year? No No Yes No No     [facility] 2[facility]9 03/09/2018 03/02/2018 02/15/2018  Decreased Interest 0 0 0  Down, Depressed, Hopeless 0 0 0  PHQ - 2 Score 0 0 0    No Known Allergies  Prior to Admission medications   Medication Sig Start Date End Date Taking? Authorizing Provider  amLODipine (NORVASC) 10 MG tablet Take 1 tablet (10 mg total) by mouth daily. 03/02/18  Yes Myles LippsSantiago, Irma M, MD  aspirin 81 MG tablet Take 81 mg by mouth daily.   Yes [provider]  ferrous gluconate (FERGON) 324 MG tablet Take 1 tablet (324 mg total) by mouth daily with breakfast. 02/06/18  Yes Myles LippsSantiago, Irma M, MD  Fiber-Vitamins-Minerals [facility](FIBERALL) POWD Take 2 scoop by mouth daily.   Yes [provider]  hydrochlorothiazide (HYDRODIURIL) 25 MG tablet Take 0.5 tablets (12.5 mg total) by mouth daily. 03/02/18  Yes Myles LippsSantiago, Irma M, MD  Ibuprofen-Famotidine (DUEXIS) 800-26.6 MG TABS Take 1 tablet by mouth 3 (three) times daily.   Yes  [provider]  levothyroxine (SYNTHROID) 100 MCG tablet Take 1 tablet (100 mcg total) by mouth daily before breakfast. 05/23/17  Yes Sagardia, Eilleen KempfMiguel Jose, MD  Multiple Vitamin (MULTIVITAMIN WITH MINERALS) TABS Take 1 tablet by mouth daily.   Yes [provider]  naproxen sodium (ANAPROX) 220 MG tablet Take 220 mg by mouth daily as needed (pain).    Yes [provider]  olmesartan (BENICAR) 40 MG tablet Take 1 tablet (40 mg total) by mouth daily. 03/02/18  Yes Myles LippsSantiago, Irma M, MD  polyethylene glycol powder (GLYCOLAX/MIRALAX) powder Take 17 g by mouth daily. 03/02/18  Yes Myles LippsSantiago, Irma M, MD  rosuvastatin (CRESTOR) 10 MG tablet Take 1 tablet (10 mg total) by mouth daily. 11/16/13  Yes Carmelina DaneAnderson, Jeffery S, MD  vitamin B-12 (CYANOCOBALAMIN) 500 MCG tablet Take 1 tablet (500 mcg total) by mouth daily. 02/06/18  Yes Myles LippsSantiago, Irma M, MD    Past Medical History:  Diagnosis Date  . Anemia, unspecified   . Hyperlipidemia, unspecified   . Hypertension   . Hypothyroidism, unspecified   . Pre-diabetes   . Vitamin B12 deficiency     Past Surgical History:  Procedure Laterality Date  . ABDOMINAL HYSTERECTOMY    . SHOULDER SURGERY      Social History   Tobacco Use  . Smoking status: Never Smoker  . Smokeless tobacco: Never Used  Substance Use Topics  . Alcohol use: No    Alcohol/week: 0.0 oz    History reviewed. No  pertinent family history.  ROS Per hpi  OBJECTIVE:  Blood pressure 120/74, pulse 97, temperature 98.7 F (37.1 C), temperature source Oral, height 5' 1.34" (1.558 m), weight 202 lb 12.8 oz (92 kg), SpO2 99 %.  Lying: 118/64, 68 Sitting: 110/60, 82 Standing: 100/60, 91  Physical Exam  Constitutional: She is oriented to person, place, and time. She appears well-developed and well-nourished.  HENT:  Head: Normocephalic and atraumatic.  Mouth/Throat: Oropharynx is clear and moist. No oropharyngeal exudate.  Eyes: Pupils are equal, round, and  reactive to light. EOM are normal. No scleral icterus.  Neck: Neck supple.  Cardiovascular: Normal rate, regular rhythm and normal heart sounds. Exam reveals no gallop and no friction rub.  No murmur heard. Pulmonary/Chest: Effort normal and breath sounds normal. She has no wheezes. She has no rales.  Musculoskeletal: She exhibits no edema.  Neurological: She is alert and oriented to person, place, and time. Gait normal.  Uses walker  Skin: Skin is warm and dry.  Psychiatric: She has a normal mood and affect.  Nursing note and vitals reviewed.    ASSESSMENT and PLAN  1. Orthostatic hypotension Returning patients to pre-hospitalization BP med doses. Continue home monitoring. Referring to PT for general balance and strengthening therapy. Patient continues to be unable to return to work, excused for next 2 weeks.  - Orthostatic vital signs - Ambulatory referral to Physical Therapy  2. Dizziness - Ambulatory referral to Physical Therapy  3. General weakness - Ambulatory referral to Physical Therapy  4. Microcytic anemia - CBC Has appt with GI on 6/26, appt info given  5. Function kidney decreased Rechecking today.  - Basic metabolic panel  Other orders - amLODipine (NORVASC) 5 MG tablet; Take 1 tablet (5 mg total) by mouth daily. - hydrochlorothiazide (HYDRODIURIL) 12.5 MG tablet; Take 1 tablet (12.5 mg total) by mouth daily. - olmesartan (BENICAR) 20 MG tablet; Take 1 tablet (20 mg total) by mouth daily.  Return in about 2 weeks (around 03/23/2018).    Myles Lipps, MD Primary Care at [facility] [address] [place], [place] [address] Ph.  [phone] Fax [phone]

## 2018-03-10 LAB — CBC
Hematocrit: 30.9 % — ABNORMAL LOW (ref 34.0–46.6)
Hemoglobin: 10.3 g/dL — ABNORMAL LOW (ref 11.1–15.9)
MCH: 25.1 pg — ABNORMAL LOW (ref 26.6–33.0)
MCHC: 33.3 g/dL (ref 31.5–35.7)
MCV: 75 fL — ABNORMAL LOW (ref 79–97)
Platelets: 333 10*3/uL (ref 150–450)
RBC: 4.11 x10E6/uL (ref 3.77–5.28)
RDW: 19.6 % — ABNORMAL HIGH (ref 12.3–15.4)
WBC: 6.1 10*3/uL (ref 3.4–10.8)

## 2018-03-10 LAB — BASIC METABOLIC PANEL
BUN/Creatinine Ratio: 20 (ref 12–28)
BUN: 17 mg/dL (ref 8–27)
CO2: 27 mmol/L (ref 20–29)
Calcium: 9.4 mg/dL (ref 8.7–10.3)
Chloride: 106 mmol/L (ref 96–106)
Creatinine, Ser: 0.85 mg/dL (ref 0.57–1.00)
GFR calc Af Amer: 83 mL/min/{1.73_m2} (ref >59)
GFR calc non Af Amer: 72 mL/min/{1.73_m2} (ref >59)
Glucose: 84 mg/dL (ref 65–99)
Potassium: 4.4 mmol/L (ref 3.5–5.2)
Sodium: 145 mmol/L — ABNORMAL HIGH (ref 134–144)

## 2018-03-12 ENCOUNTER — Telehealth: Payer: Self-pay | Admitting: Family Medicine

## 2018-03-14 ENCOUNTER — Telehealth: Payer: Self-pay | Admitting: Family Medicine

## 2018-03-14 NOTE — Telephone Encounter (Signed)
**  left VM informing pt that her appt would be at the [address] on 03/23/18**

## 2018-03-16 NOTE — Telephone Encounter (Signed)
error 

## 2018-03-20 DIAGNOSIS — Z0271 Encounter for disability determination: Secondary | ICD-10-CM

## 2018-03-21 LAB — HM COLONOSCOPY

## 2018-03-23 ENCOUNTER — Other Ambulatory Visit: Payer: Self-pay

## 2018-03-23 ENCOUNTER — Encounter: Payer: Self-pay | Admitting: Family Medicine

## 2018-03-23 ENCOUNTER — Ambulatory Visit: Payer: 59 | Admitting: Family Medicine

## 2018-03-23 VITALS — BP 132/68 | HR 79 | Temp 98.0°F | Ht 61.0 in | Wt 203.8 lb

## 2018-03-23 DIAGNOSIS — R531 Weakness: Secondary | ICD-10-CM

## 2018-03-23 DIAGNOSIS — R29818 Other symptoms and signs involving the nervous system: Secondary | ICD-10-CM

## 2018-03-23 DIAGNOSIS — R42 Dizziness and giddiness: Secondary | ICD-10-CM | POA: Diagnosis not present

## 2018-03-23 DIAGNOSIS — I951 Orthostatic hypotension: Secondary | ICD-10-CM

## 2018-03-23 DIAGNOSIS — D509 Iron deficiency anemia, unspecified: Secondary | ICD-10-CM | POA: Diagnosis not present

## 2018-03-23 NOTE — Progress Notes (Signed)
7/5/201911:33 AM  Anna FiremanMary A Infinger 06/14/1951, 67 y.o. female [id number]  Chief Complaint  Patient presents with  . Bp and weakness    2 week follow up     HPI:   Patient is a 67 y.o. female with past medical history significant for HTN, HLP, microcystic anemia, hypothyroidism who presents today for followup  Patient hospitalized in may for urosepsis and found to have microcystic anemia requiring blood transfusion Since then she has had persistent dizziness and unsteady gait Orthostatic hypotension at the beginning, BP meds have been adjusted, no more orthostatic hypotension Patient reports that dizziness is better but she still feels unsteady when standing/walking She still needs support when walking but is also using the walker less She is still not driving She has not heard from PT Nurse from health insurance establishes contact weekly  She had EGD/colonscopy done 2 days ago Normal, no cause for anemia Repeat colonoscopy in 10 years Reports sent for scanning Planning in capsule EGD for small bowel eval   Fall Risk  03/09/2018 03/02/2018 02/15/2018 02/06/2018 08/12/2017  Falls in the past year? No No Yes No No     [facility] 03/09/2018 03/02/2018 02/15/2018  Decreased Interest 0 0 0  Down, Depressed, Hopeless 0 0 0  PHQ - 2 Score 0 0 0    No Known Allergies  Prior to Admission medications   Medication Sig Start Date End Date Taking? Authorizing Provider  amLODipine (NORVASC) 5 MG tablet Take 1 tablet (5 mg total) by mouth daily. 03/09/18  Yes Myles LippsSantiago, Irma M, MD  aspirin 81 MG tablet Take 81 mg by mouth daily.   Yes [provider]  ferrous gluconate (FERGON) 324 MG tablet Take 1 tablet (324 mg total) by mouth daily with breakfast. 02/06/18  Yes Myles LippsSantiago, Irma M, MD  hydrochlorothiazide (HYDRODIURIL) 12.5 MG tablet Take 1 tablet (12.5 mg total) by mouth daily. 03/09/18  Yes Myles LippsSantiago, Irma M, MD  levothyroxine (SYNTHROID) 100 MCG tablet Take 1 tablet  (100 mcg total) by mouth daily before breakfast. 05/23/17  Yes Sagardia, Eilleen KempfMiguel Jose, MD  Multiple Vitamin (MULTIVITAMIN WITH MINERALS) TABS Take 1 tablet by mouth daily.   Yes [provider]  naproxen sodium (ANAPROX) 220 MG tablet Take 220 mg by mouth daily as needed (pain).    Yes [provider]  olmesartan (BENICAR) 20 MG tablet Take 1 tablet (20 mg total) by mouth daily. 03/09/18  Yes Myles LippsSantiago, Irma M, MD  polyethylene glycol powder (GLYCOLAX/MIRALAX) powder Take 17 g by mouth daily. 03/02/18  Yes Myles LippsSantiago, Irma M, MD  rosuvastatin (CRESTOR) 10 MG tablet Take 1 tablet (10 mg total) by mouth daily. 11/16/13  Yes Carmelina DaneAnderson, Jeffery S, MD  vitamin B-12 (CYANOCOBALAMIN) 500 MCG tablet Take 1 tablet (500 mcg total) by mouth daily. 02/06/18  Yes Myles LippsSantiago, Irma M, MD    Past Medical History:  Diagnosis Date  . Anemia, unspecified   . Hyperlipidemia, unspecified   . Hypertension   . Hypothyroidism, unspecified   . Pre-diabetes   . Vitamin B12 deficiency     Past Surgical History:  Procedure Laterality Date  . ABDOMINAL HYSTERECTOMY    . SHOULDER SURGERY      Social History   Tobacco Use  . Smoking status: Never Smoker  . Smokeless tobacco: Never Used  Substance Use Topics  . Alcohol use: No    Alcohol/week: 0.0 oz    No family history on file.  Review of Systems  Constitutional: Negative for  chills and fever.  HENT: Negative for hearing loss and tinnitus.   Eyes: Negative for blurred vision and double vision.  Respiratory: Negative for cough and shortness of breath.   Cardiovascular: Negative for chest pain, palpitations and leg swelling.  Gastrointestinal: Negative for abdominal pain, nausea and vomiting.  Neurological: Positive for dizziness. Negative for tingling, speech change, focal weakness and headaches.     OBJECTIVE:  Blood pressure 132/68, pulse 79, temperature 98 F (36.7 C), temperature source Oral, height 5\' 1"  (1.549 m), weight 203 lb 12.8 oz  (92.4 kg), SpO2 95 %.  Physical Exam  Constitutional: She is oriented to person, place, and time. She appears well-developed and well-nourished.  HENT:  Head: Normocephalic and atraumatic.  Mouth/Throat: Oropharynx is clear and moist. No oropharyngeal exudate.  Eyes: Pupils are equal, round, and reactive to light. EOM are normal. No scleral icterus.  Neck: Neck supple.  Cardiovascular: Normal rate, regular rhythm and normal heart sounds. Exam reveals no gallop and no friction rub.  No murmur heard. Pulmonary/Chest: Effort normal and breath sounds normal. She has no wheezes. She has no rales.  Musculoskeletal: She exhibits no edema.  Neurological: She is alert and oriented to person, place, and time. She displays no tremor and normal reflexes. No cranial nerve deficit. She exhibits normal muscle tone. Gait abnormal.  LUE 5/5 shoulder, elbow and wrist.  RUE 4/5 shoulder and elbow, 5/5 wrist Normal grasp  BLE 5/5 hip, knees and ankles  Normal finger-nose-finger, and heel to chin bilaterally Slower alternating rapid hand movements on the right Sways with romberg but able to self correct No pronator drift  Skin: Skin is warm and dry.  Psychiatric: She has a normal mood and affect.  Nursing note and vitals reviewed.   ASSESSMENT and PLAN  1. Dizziness 2. Right sided weakness Advised seek out PT. Ordering MRI due to continue sx and minor right sided weakness. Consider neuro referral. Not released to work as of yet.   3. Orthostatic hypotension Resolved. Cont current HTN regime  4. Microcytic anemia So far no etiology, GI plans in capsule EGD  5. Other symptoms and signs involving the nervous system - MR Brain W Wo Contrast; Future  Return in about 3 weeks (around 04/13/2018).    Myles Lipps, MD Primary Care at [facility] [address] [place], [place] [address] Ph.  [phone] Fax [phone]

## 2018-03-23 NOTE — Patient Instructions (Addendum)
Outpatient Neuro [facility] [address], [place], [place] [address]  Phone: [phone]     IF you received an x-ray today, you will receive an invoice from [facility]. Please contact [facility] at [phone] with questions or concerns regarding your invoice.   IF you received labwork today, you will receive an invoice from [facility]. Please contact [facility] at [phone] with questions or concerns regarding your invoice.   Our billing staff will not be able to assist you with questions regarding bills from these companies.  You will be contacted with the lab results as soon as they are available. The fastest way to get your results is to activate your My Chart account. Instructions are located on the last page of this paperwork. If you have not heard from us regarding the results in 2 weeks, please contact this office.

## 2018-03-24 ENCOUNTER — Other Ambulatory Visit: Payer: Self-pay | Admitting: Family Medicine

## 2018-03-25 NOTE — Telephone Encounter (Signed)
Pt has appt with Anna Bernard 04/17/2018

## 2018-03-30 ENCOUNTER — Telehealth: Payer: Self-pay

## 2018-03-30 NOTE — Telephone Encounter (Signed)
Spoke with pt. Advised that [facility] and gave pt phone number to contact them to schedule 7/12

## 2018-03-30 NOTE — Telephone Encounter (Signed)
Copied from CRM [phone]. Topic: Referral - Status >> Mar 29, 2018  3:44 PM Oneal GroutSebastian, Jennifer S wrote: Reason for CRM: Checking status of referral to PT  Message sent to referrals to advise. Order in.

## 2018-03-31 ENCOUNTER — Other Ambulatory Visit: Payer: Self-pay | Admitting: Family Medicine

## 2018-04-03 ENCOUNTER — Other Ambulatory Visit: Payer: Self-pay

## 2018-04-03 ENCOUNTER — Ambulatory Visit: Payer: 59 | Attending: Family Medicine

## 2018-04-03 DIAGNOSIS — R42 Dizziness and giddiness: Secondary | ICD-10-CM | POA: Diagnosis not present

## 2018-04-03 DIAGNOSIS — M6281 Muscle weakness (generalized): Secondary | ICD-10-CM | POA: Diagnosis present

## 2018-04-03 DIAGNOSIS — R2689 Other abnormalities of gait and mobility: Secondary | ICD-10-CM | POA: Insufficient documentation

## 2018-04-03 NOTE — Therapy (Signed)
[facility] [address] [place], [place], [address] Phone: [phone]   Fax:  [phone]  Physical Therapy Evaluation  Patient Details  Name: Anna Bernard MRN: [id number] Date of Birth: 05-19-1951 Referring Provider: Dr. Leretha Pol   Encounter Date: 04/03/2018  PT End of Session - 04/03/18 1158    Visit Number  1    Number of Visits  17    Date for PT Re-Evaluation  06/02/18    Authorization Type  [employer]: limited to 60 visits between PT, OT, and Speech    PT Start Time  1107    PT Stop Time  1147    PT Time Calculation (min)  40 min    Equipment Utilized During Treatment  -- min guard to S prn    Activity Tolerance  Patient tolerated treatment well    Behavior During Therapy  [facility] for tasks assessed/performed       Past Medical History:  Diagnosis Date  . Anemia, unspecified   . Hyperlipidemia, unspecified   . Hypertension   . Hypothyroidism, unspecified   . Pre-diabetes   . Vitamin B12 deficiency     Past Surgical History:  Procedure Laterality Date  . ABDOMINAL HYSTERECTOMY    . SHOULDER SURGERY      There were no vitals filed for this visit.   Subjective Assessment - 04/03/18 1115    Subjective  Pt reported dizziness has become worse over the last few months, especially after hospitalization in 01/2018 with s/s of malaise, LBP, and general weakness. Pt found to have sepsis 2/2 pyelonephritis. She feels like the lightheadedness has been slowly getting worse for years. Pt reported one fall in the last 6 months "uneven piece of concreted caused pt to trip". Pt denied injury or hitting head. Pt denied tinnitus, hx of migraines, weakness, or N/T. Pt report dizziness is worse during sit to stand txfs and when reaching down. Pt describes dizziness as swimmy-headed vs. spinning. Pt needed pt's husband to hold on to during amb., she feels safer walking in the house vs. walking outdoors-she's always watching where  she's walking.     Patient is accompained by:  Family member husband: [name]    Pertinent History  HTN, thyroid disease, DM (pre-diabetes now?), HLD    Patient Stated Goals  To get back to normal    Currently in Pain?  No/denies         [facility] PT Assessment - 04/03/18 1122      Assessment   Medical Diagnosis  Orthostasis hypotension, dizziness, general weakness    Referring Provider  Dr. Leretha Pol    Onset Date/Surgical Date  08/19/17 between 12/18 and 1/19 is when issues began     Hand Dominance  Right    Prior Therapy  none for balance      Precautions   Precautions  Fall      Restrictions   Weight Bearing Restrictions  No      Balance Screen   Has the patient fallen in the past 6 months  Yes    How many times?  1    Has the patient had a decrease in activity level because of a fear of falling?   Yes    Is the patient reluctant to leave their home because of a fear of falling?   Yes      Home Environment   Living Environment  Private residence    Living Arrangements  Spouse/significant other  Available Help at Discharge  Family    Type of Home  House    Home Access  Stairs to enter    Entrance Stairs-Number of Steps  5    Entrance Stairs-Rails  Can reach both    Home Layout  Two level    Alternate Level Stairs-Number of Steps  12-15    Alternate Level Stairs-Rails  Right    Home Equipment  Walker - 2 wheels    Additional Comments  Pt uses RW in the community intermittently      Prior Function   Level of Independence  Independent    Vocation  Other (comment) medical leave    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    Leisure  Read, go to the movies, spend time with grandchildren and Art gallery managergreat-grandchildren, play computer games      Cognition   Overall Cognitive Status  Within Functional Limits for tasks assessed      Sensation   Additional Comments  Denied N/T      Ambulation/Gait   Ambulation/Gait  Yes     Ambulation/Gait Assistance  4: Min guard;5: Supervision    Ambulation/Gait Assistance Details  Min guard to S for safety.    Ambulation Distance (Feet)  100 Feet    Assistive device  None;1 person hand held assist    Gait Pattern  Decreased stride length;Step-to pattern;Decreased arm swing - right;Decreased arm swing - left;Decreased dorsiflexion - right;Decreased dorsiflexion - left;Shuffle;Lateral trunk lean to right;Lateral trunk lean to left;Decreased trunk rotation    Ambulation Surface  Level;Indoor    Gait velocity  1.6929ft/sec and 1.6762ft/sec. no AD           Vestibular Assessment - 04/03/18 1137      Symptom Behavior   Type of Dizziness  Lightheadedness    Frequency of Dizziness  Daily if standing from lower surface or looking down.     Duration of Dizziness  < 1minute    Aggravating Factors  Sit to stand reaching down    Relieving Factors  Rest      Occulomotor Exam   Occulomotor Alignment  Normal    Spontaneous  Absent    Gaze-induced  Absent    Smooth Pursuits  Intact    Saccades  Intact    Comment  Denied dizziness during exam. B HIT (-)      Vestibulo-Occular Reflex   VOR 1 Head Only (x 1 viewing)  VOR provoked dizziness (6/10-but pt describes it as a little bit).     VOR Cancellation  Normal          Objective measurements completed on examination: See above findings.              PT Education - 04/03/18 1157    Education Details  PT discussed outcome measures, PT POC, duration, and frequency. PT discussed tests/measures for next visit and that PT will assess for vertigo and orthostasis. Pt reported she still drives at times, and PT encouraged pt to limit driving when she has s/s.     Person(s) Educated  Patient;Spouse    Methods  Explanation    Comprehension  Verbalized understanding       PT Short Term Goals - 04/03/18 1207      PT SHORT TERM GOAL #1   Title  Pt will be IND in HEP to improve deficits listed above. TARGET DATE FOR ALL  STGS: 05/01/18    Status  New  PT SHORT TERM GOAL #2   Title  Perform BERG and write STG/LTGs as indicated.     Status  New      PT SHORT TERM GOAL #3   Title  Pt will improve gait speed with LRAD to >/=1.22ft/sec to decr. falls risk.     Status  New      PT SHORT TERM GOAL #4   Title  Pt will amb. 200' over even terrain with LRAD at MOD I level to improve functional mobility.     Status  New      PT SHORT TERM GOAL #5   Title  Finish vestibular exam and write goals indicated.     Status  New        PT Long Term Goals - 04/03/18 1209      PT LONG TERM GOAL #1   Title  Pt will improve gait speed with LRAD to >/=2.60ft/sec. to safely amb. in the community. TARGET DATE FOR ALL LTGS: 05/29/18    Status  New      PT LONG TERM GOAL #2   Title  Pt will amb. 400' over even/paved surfaces with LRAD at MOD I level to  improve functional mobility.     Status  New      PT LONG TERM GOAL #3   Title  Pt will perform STS txfs with </=2/10 dizziness and no LOB to safely perform txfs at home.     Status  New             Plan - 04/03/18 1159    Clinical Impression Statement  Pt is a pleasant 67y/o female presenting to OPPT neuro for orthostatic hypotension, dizziness, and weakness. Pt was hospitalized 01/28/18-02/01/18 for sepsis with pyelonephritis. Pt's PMH is significant for the following: HTN, thyroid disease, DM (pre-diabetes now?), HLD. Pt's gait speed indicates pt is at an incr. risk for falls. PT did not formally perform MMT, but BLE weakness suspected based on gait deviations (especially in B hips). VOR slightly provoked dizziness but oculomotor exam did not provoke dizziness. Pt limited mobility limited the amount of tests/ measures PT was able to perform during eval. PT will asses for BPPV, orthostasis and perform formal balance test as tolerated. The following deficits were noted upon exam today: gait deviations, impaired balance, dizziness, decr. strength, and decr.  flexibility. Pt would benefit from skilled PT to improve safety during functional mobility.     History and Personal Factors relevant to plan of care:  Pt works full-time but has been on leave since hospitalization in 01/2018, two-story home, likes playing with her grandchildren, pt has MRI brain scheduled for 04/09/18    Clinical Presentation  Evolving    Clinical Presentation due to:  HTN, thyroid disease, DM (pre-diabetes now?), HLD    Clinical Decision Making  Moderate    Rehab Potential  Good    Clinical Impairments Affecting Rehab Potential  see above    PT Frequency  2x / week    PT Duration  8 weeks    PT Treatment/Interventions  ADLs/Self Care Home Management;Biofeedback;Canalith Repostioning;Electrical Stimulation;Therapeutic activities;Therapeutic exercise;Functional mobility training;Stair training;Gait training;DME Instruction;Balance training;Neuromuscular re-education;Patient/family education;Orthotic Fit/Training;Manual techniques;Vestibular    PT Next Visit Plan  Assess for orthostasis, BPPV, perform BERG, DVA vs. SVA. Provide HEP.    Consulted and Agree with Plan of Care  Patient;Family member/caregiver    Family Member Consulted  husband: [name]       Patient will benefit from skilled therapeutic intervention  in order to improve the following deficits and impairments:  Abnormal gait, Decreased endurance, Obesity, Decreased knowledge of use of DME, Decreased strength, Decreased balance, Decreased mobility, Dizziness, Impaired flexibility  Visit Diagnosis: Dizziness and giddiness - Plan: PT plan of care cert/re-cert  Other abnormalities of gait and mobility - Plan: PT plan of care cert/re-cert  Muscle weakness (generalized) - Plan: PT plan of care cert/re-cert     Problem List Patient Active Problem List   Diagnosis Date Noted  . Vitamin B12 deficiency 02/06/2018  . Sepsis secondary to UTI (HCC) 01/29/2018  . Microcytic anemia 01/29/2018  . Renal insufficiency  01/29/2018  . Pure hypercholesterolemia 12/24/2014  . Diabetes mellitus type 2, controlled, without complications (HCC) 12/24/2014  . Bruit of right carotid artery 12/24/2014  . Thyroid disease 07/06/2014  . Severe obesity (BMI >= 40) (HCC) 07/06/2014  . HTN (hypertension) 07/06/2014    Uriyah Raska L 04/03/2018, 12:13 PM  [facility] [address] [place], [place], [address] Phone: [phone]   Fax:  [phone]  Name: Anna Bernard MRN: [id number] Date of Birth: 09/17/1951  Zerita Boers, PT,DPT 04/03/18 12:13 PM Phone: [phone] Fax: [phone]

## 2018-04-04 ENCOUNTER — Encounter: Payer: Self-pay | Admitting: *Deleted

## 2018-04-05 ENCOUNTER — Telehealth: Payer: Self-pay | Admitting: Family Medicine

## 2018-04-05 NOTE — Telephone Encounter (Signed)
Copied from CRM [phone]. Topic: Inquiry >> Apr 04, 2018  4:28 PM Anna Bernard, Anna Bernard: Reason for CRM: pt called to ask if her neck was checked for a blockage; her insurance provider is asking the pt to inquire

## 2018-04-09 ENCOUNTER — Ambulatory Visit
Admission: RE | Admit: 2018-04-09 | Discharge: 2018-04-09 | Disposition: A | Discharge status: Home / Self Care | Payer: 59 | Source: Ambulatory Visit | Attending: Family Medicine | Admitting: Family Medicine

## 2018-04-09 DIAGNOSIS — R29818 Other symptoms and signs involving the nervous system: Secondary | ICD-10-CM

## 2018-04-09 MED ORDER — GADOBENATE DIMEGLUMINE 529 MG/ML IV SOLN
19.0000 mL | Freq: Once | INTRAVENOUS | Status: AC | PRN
  Administered 2018-04-09: 19 mL via INTRAVENOUS

## 2018-04-09 NOTE — Telephone Encounter (Signed)
Pls advise.  

## 2018-04-12 ENCOUNTER — Ambulatory Visit: Payer: 59 | Admitting: Physical Therapy

## 2018-04-12 ENCOUNTER — Encounter: Payer: Self-pay | Admitting: *Deleted

## 2018-04-12 VITALS — BP 117/71 | HR 82

## 2018-04-12 DIAGNOSIS — R42 Dizziness and giddiness: Secondary | ICD-10-CM | POA: Diagnosis not present

## 2018-04-12 DIAGNOSIS — R2689 Other abnormalities of gait and mobility: Secondary | ICD-10-CM

## 2018-04-12 NOTE — Patient Instructions (Signed)
Gaze Stabilization: Tip Card  1.Target must remain in focus, not blurry, and appear stationary while head is in motion. 2.Perform exercises with small head movements (45 to either side of midline). 3.Increase speed of head motion so long as target is in focus. 4.If you wear eyeglasses, be sure you can see target through lens (therapist will give specific instructions for bifocal / progressive lenses). 5.These exercises may provoke dizziness or nausea. Work through these symptoms. If too dizzy, slow head movement slightly. Rest between each exercise. 6.Exercises demand concentration; avoid distractions. 7.For safety, perform standing exercises close to a counter, wall, corner, or next to someone.   Gaze Stabilization: Standing Feet Apart    Feet shoulder width apart, keeping eyes on target on wall __6__ feet away, tilt head down 15-30 and move head side to side for __60__ seconds. Repeat while moving head up and down for _60___ seconds. Do _3___ sessions per day.   LETTER ON PLAIN BACKGROUND

## 2018-04-12 NOTE — Telephone Encounter (Signed)
Please let patient that no she has not had carotid dopplers. Her MRI did not show any evidence of stroke. She does have changes that are seen mostly with hypertension. Given that dizziness/vertigo is not caused by carotid stenosis there is no indication for testing. We can discuss at our next appointment. thanks

## 2018-04-13 ENCOUNTER — Encounter: Payer: Self-pay | Admitting: Physical Therapy

## 2018-04-13 NOTE — Telephone Encounter (Signed)
Pt states she has a missed call from 7/26 approx 11:05. No notes to indicate who called. Pt states that she did not have a voicemail. Please call back at [phone].

## 2018-04-13 NOTE — Therapy (Signed)
[facility] [address] [place], [place], [address] Phone: [phone]   Fax:  [phone]  Physical Therapy Treatment  Patient Details  Name: Anna Bernard MRN: [id number] Date of Birth: 1951-03-07 Referring Provider: Dr. Leretha Pol   Encounter Date: 04/12/2018  PT End of Session - 04/13/18 2052    Visit Number  2    Number of Visits  17    Authorization Type  [employer]: limited to 60 visits between PT, OT, and Speech    PT Start Time  1317    PT Stop Time  1402    PT Time Calculation (min)  45 min       Past Medical History:  Diagnosis Date  . Anemia, unspecified   . Hyperlipidemia, unspecified   . Hypertension   . Hypothyroidism, unspecified   . Pre-diabetes   . Vitamin B12 deficiency     Past Surgical History:  Procedure Laterality Date  . ABDOMINAL HYSTERECTOMY    . SHOULDER SURGERY      Vitals:   04/12/18 1331  BP: 117/71  Pulse: 82    Subjective Assessment - 04/12/18 1319    Subjective  Pt reports mild dizziness at this time; states she feels light-headed when she stands up; states she has had this problem since she was discharged from hospital; not using assistive device at this time, but states she is ordering the [facility] cane    Pertinent History  HTN, thyroid disease, DM (pre-diabetes now?), HLD    Patient Stated Goals  To get back to normal    Currently in Pain?  No/denies             Vestibular Assessment - 04/13/18 0001      Visual Acuity   Static  line 10    Dynamic  line 8      Positional Testing   Sidelying Test  Sidelying Right;Sidelying Left      Sidelying Right   Sidelying Right Duration  none    Sidelying Right Symptoms  -- pt had dizziness with return to upright sitting      Sidelying Left   Sidelying Left Duration  none pt had dizziness with return to upright sitting      Orthostatics   BP supine (x 5 minutes)  118/74    HR supine (x 5 minutes)  78    BP sitting   99/68    HR sitting  83    BP standing (after 1 minute)  117/65    HR standing (after 1 minute)  84    BP standing (after 3 minutes)  109/67    HR standing (after 3 minutes)  89               [facility] Adult PT Treatment/Exercise - 04/13/18 0001      Ambulation/Gait   Ambulation/Gait  Yes    Ambulation/Gait Assistance  4: Min guard    Ambulation Distance (Feet)  150 Feet    Assistive device  None    Gait Pattern  Decreased stride length;Step-to pattern;Decreased arm swing - right;Decreased arm swing - left;Decreased dorsiflexion - right;Decreased dorsiflexion - left;Shuffle;Lateral trunk lean to right;Lateral trunk lean to left;Decreased trunk rotation    Ambulation Surface  Level;Indoor             PT Education - 04/13/18 2052    Education Details  gave x1 viewing for HEP    Person(s) Educated  Patient    Methods  Explanation;Demonstration;Handout    Comprehension  Verbalized understanding;Returned demonstration       PT Short Term Goals - 04/03/18 1207      PT SHORT TERM GOAL #1   Title  Pt will be IND in HEP to improve deficits listed above. TARGET DATE FOR ALL STGS: 05/01/18    Status  New      PT SHORT TERM GOAL #2   Title  Perform BERG and write STG/LTGs as indicated.     Status  New      PT SHORT TERM GOAL #3   Title  Pt will improve gait speed with LRAD to >/=1.32ft/sec to decr. falls risk.     Status  New      PT SHORT TERM GOAL #4   Title  Pt will amb. 200' over even terrain with LRAD at MOD I level to improve functional mobility.     Status  New      PT SHORT TERM GOAL #5   Title  Finish vestibular exam and write goals indicated.     Status  New        PT Long Term Goals - 04/03/18 1209      PT LONG TERM GOAL #1   Title  Pt will improve gait speed with LRAD to >/=2.22ft/sec. to safely amb. in the community. TARGET DATE FOR ALL LTGS: 05/29/18    Status  New      PT LONG TERM GOAL #2   Title  Pt will amb. 400' over even/paved surfaces with  LRAD at MOD I level to  improve functional mobility.     Status  New      PT LONG TERM GOAL #3   Title  Pt will perform STS txfs with </=2/10 dizziness and no LOB to safely perform txfs at home.     Status  New            Plan - 04/13/18 2053    Clinical Impression Statement  Pt had drop in systolic BP of 19 points with supine to sitting transfer (indicative of orthostatic hypotension); pt c/o moderate light-headedness with this transitional movement; DVA test WNL's with 2 line difference, however, pt reported significant dizziness upon completion of test    Rehab Potential  Good    Clinical Impairments Affecting Rehab Potential  see above    PT Frequency  2x / week    PT Duration  8 weeks    PT Treatment/Interventions  ADLs/Self Care Home Management;Biofeedback;Canalith Repostioning;Electrical Stimulation;Therapeutic activities;Therapeutic exercise;Functional mobility training;Stair training;Gait training;DME Instruction;Balance training;Neuromuscular re-education;Patient/family education;Orthotic Fit/Training;Manual techniques;Vestibular    PT Next Visit Plan  do Berg - check HEP - give balance HEP    PT Home Exercise Plan  x1 viewing in standing    Consulted and Agree with Plan of Care  Patient       Patient will benefit from skilled therapeutic intervention in order to improve the following deficits and impairments:  Abnormal gait, Decreased endurance, Obesity, Decreased knowledge of use of DME, Decreased strength, Decreased balance, Decreased mobility, Dizziness, Impaired flexibility  Visit Diagnosis: Dizziness and giddiness  Other abnormalities of gait and mobility     Problem List Patient Active Problem List   Diagnosis Date Noted  . Vitamin B12 deficiency 02/06/2018  . Sepsis secondary to UTI (HCC) 01/29/2018  . Microcytic anemia 01/29/2018  . Renal insufficiency 01/29/2018  . Pure hypercholesterolemia 12/24/2014  . Diabetes mellitus type 2, controlled, without  complications (HCC) 12/24/2014  . Bruit of  right carotid artery 12/24/2014  . Thyroid disease 07/06/2014  . Severe obesity (BMI >= 40) (HCC) 07/06/2014  . HTN (hypertension) 07/06/2014    Skyy Mcknight, Donavan BurnetLinda Suzanne, PT 04/13/2018, 8:58 PM  [facility] [address] [place], [place], [address] Phone: [phone]   Fax:  [phone]  Name: Jan FiremanMary A Westendorf MRN: [id number] Date of Birth: 05/11/1951

## 2018-04-16 ENCOUNTER — Ambulatory Visit (HOSPITAL_COMMUNITY)
Admission: RE | Admit: 2018-04-16 | Discharge: 2018-04-16 | Disposition: A | Discharge status: Home / Self Care | Payer: 59 | Source: Ambulatory Visit | Attending: Gastroenterology | Admitting: Gastroenterology

## 2018-04-16 ENCOUNTER — Encounter (HOSPITAL_COMMUNITY)
Admission: RE | Disposition: A | Discharge status: Home / Self Care | Payer: Self-pay | Source: Ambulatory Visit | Attending: Gastroenterology

## 2018-04-16 DIAGNOSIS — E785 Hyperlipidemia, unspecified: Secondary | ICD-10-CM | POA: Diagnosis not present

## 2018-04-16 DIAGNOSIS — E538 Deficiency of other specified B group vitamins: Secondary | ICD-10-CM | POA: Diagnosis not present

## 2018-04-16 DIAGNOSIS — D509 Iron deficiency anemia, unspecified: Secondary | ICD-10-CM | POA: Diagnosis present

## 2018-04-16 DIAGNOSIS — K5900 Constipation, unspecified: Secondary | ICD-10-CM | POA: Diagnosis not present

## 2018-04-16 DIAGNOSIS — I10 Essential (primary) hypertension: Secondary | ICD-10-CM | POA: Insufficient documentation

## 2018-04-16 DIAGNOSIS — R7303 Prediabetes: Secondary | ICD-10-CM | POA: Insufficient documentation

## 2018-04-16 DIAGNOSIS — E039 Hypothyroidism, unspecified: Secondary | ICD-10-CM | POA: Insufficient documentation

## 2018-04-16 HISTORY — PX: GIVENS CAPSULE STUDY: SHX5432

## 2018-04-16 SURGERY — IMAGING PROCEDURE, GI TRACT, INTRALUMINAL, VIA CAPSULE

## 2018-04-16 SURGICAL SUPPLY — 1 items: TOWEL COTTON PACK 4EA (MISCELLANEOUS) ×4 IMPLANT

## 2018-04-16 NOTE — H&P (Signed)
  Anna Bernard HPI: The patient was found to have a microcytic anemia. Her most recent HGB on 03/09/2018 was at 10.3 g/dL with an MCV of 75. The lowest amount was on 01/28/2018 was 8.6 g/dL with a MCV of 16.170.9. The urinalysis was negative for hematuria. The iron panel showed a sauration of 3% and ferritin of 41. The patient reports a history of intermittent anemia in the past and she believes she was worked up in the past. The patient thinks that she had a colonoscopy with Dr. Randa Bernard 7-8 years ago. She denies any issues with nausea, vomiting, diarrhea, constipation, melena, or hematochezia. Miralax is used for her constipation. For the past two years she used naproxen and she averaged once per day. There is no known family history of colon cancer and she denies any issues with chest pain, SOB, or MI. She denies any known hemoglobinopathies.  Her EGD/Colonoscopy were negative for any notable pathology.  Past Medical History:  Diagnosis Date  . Anemia, unspecified   . Hyperlipidemia, unspecified   . Hypertension   . Hypothyroidism, unspecified   . Pre-diabetes   . Vitamin B12 deficiency     Past Surgical History:  Procedure Laterality Date  . ABDOMINAL HYSTERECTOMY    . SHOULDER SURGERY      No family history on file.  Social History:  reports that she has never smoked. She has never used smokeless tobacco. She reports that she does not drink alcohol or use drugs.  Allergies: No Known Allergies  Medications: Scheduled: Continuous:  No results found for this or any previous visit (from the past 24 hour(s)).   No results found.  ROS:  As stated above in the HPI otherwise negative.  Height 5\' 1"  (1.549 m), weight 92.1 kg (203 lb).    PE: Nursing procedure.  Assessment/Plan: 1) IDA - Capsule endoscopy.  Anna Bernard 04/16/2018, 8:42 AM

## 2018-04-16 NOTE — Progress Notes (Signed)
Patient came in today for capsule endoscopy. She was given instructions and verbalized understanding. She swallowed the pill with no problems or complaints. Patient will return the capsule equipment tomorrow morning.

## 2018-04-16 NOTE — Telephone Encounter (Signed)
See previous note.   crm in

## 2018-04-17 ENCOUNTER — Ambulatory Visit: Payer: 59 | Admitting: Family Medicine

## 2018-04-17 ENCOUNTER — Other Ambulatory Visit: Payer: Self-pay

## 2018-04-17 ENCOUNTER — Encounter: Payer: Self-pay | Admitting: Family Medicine

## 2018-04-17 VITALS — BP 122/80 | HR 90 | Temp 99.1°F | Ht 61.02 in | Wt 204.6 lb

## 2018-04-17 DIAGNOSIS — D509 Iron deficiency anemia, unspecified: Secondary | ICD-10-CM

## 2018-04-17 DIAGNOSIS — R42 Dizziness and giddiness: Secondary | ICD-10-CM

## 2018-04-17 DIAGNOSIS — I951 Orthostatic hypotension: Secondary | ICD-10-CM

## 2018-04-17 NOTE — Progress Notes (Signed)
7/30/20199:01 AM  Anna Bernard December 06, 1950, 67 y.o. female [id number]  Chief Complaint  Patient presents with  . Dizziness    wants to discuss titration of bp medication    HPI:   Patient is a 67 y.o. female with past medical history significant for dizziness, HTN, HLP, prediabetes, microcystic anemia, and hypothyroidism who presents today for routine followup  Last visit BP meds all cut in half Had first PT session 5 days ago, found to still be orthostatic and have gait abnormality. Scheduled twice a week for 8 weeks. Patient has started doing HEP, unable to do head exercises as she gets really dizzy. Working slowly towards that goal She still needs to stand still after position change as she gets dizzy She is still holding on to things for balance Had capsule for endo swallow yesterday tsh and a1c checked in may 2019 - at goal Has no acute concerns today  Fall Risk  04/17/2018 03/09/2018 03/02/2018 02/15/2018 02/06/2018  Falls in the past year? No No No Yes No     Depression screen [facility] 2/9 04/17/2018 03/09/2018 03/02/2018  Decreased Interest 0 0 0  Down, Depressed, Hopeless 0 0 0  PHQ - 2 Score 0 0 0    No Known Allergies  Prior to Admission medications   Medication Sig Start Date End Date Taking? Authorizing Provider  amLODipine (NORVASC) 10 MG tablet TAKE 0.5 TABLET (5mg  total) BY MOUTH EVERY DAY 03/25/18  Yes Myles Lipps, MD  aspirin 81 MG tablet Take 81 mg by mouth daily.   Yes [provider]  ferrous gluconate (FERGON) 324 MG tablet Take 1 tablet (324 mg total) by mouth daily with breakfast. 02/06/18  Yes Myles Lipps, MD  hydrochlorothiazide (HYDRODIURIL) 25 MG tablet TAKE 0.5 TABLETS (12.5 MG TOTAL) BY MOUTH DAILY. 03/25/18  Yes Myles Lipps, MD  levothyroxine (SYNTHROID) 100 MCG tablet Take 1 tablet (100 mcg total) by mouth daily before breakfast. 05/23/17  Yes Sagardia, Eilleen Kempf, MD  Multiple Vitamin (MULTIVITAMIN WITH MINERALS) TABS Take 1 tablet by  mouth daily.   Yes [provider]  naproxen sodium (ANAPROX) 220 MG tablet Take 220 mg by mouth daily as needed (pain).    Yes [provider]  olmesartan (BENICAR) 40 MG tablet TAKE 1 TABLET BY MOUTH EVERY DAY 03/25/18  Yes Myles Lipps, MD  polyethylene glycol powder (GLYCOLAX/MIRALAX) powder Take 17 g by mouth daily. 03/02/18  Yes Myles Lipps, MD  rosuvastatin (CRESTOR) 10 MG tablet Take 1 tablet (10 mg total) by mouth daily. 11/16/13  Yes Carmelina Dane, MD  vitamin B-12 (CYANOCOBALAMIN) 500 MCG tablet Take 1 tablet (500 mcg total) by mouth daily. 02/06/18  Yes Myles Lipps, MD    Past Medical History:  Diagnosis Date  . Anemia, unspecified   . Hyperlipidemia, unspecified   . Hypertension   . Hypothyroidism, unspecified   . Pre-diabetes   . Vitamin B12 deficiency     Past Surgical History:  Procedure Laterality Date  . ABDOMINAL HYSTERECTOMY    . SHOULDER SURGERY      Social History   Tobacco Use  . Smoking status: Never Smoker  . Smokeless tobacco: Never Used  Substance Use Topics  . Alcohol use: No    Alcohol/week: 0.0 oz    History reviewed. No pertinent family history.  Review of Systems  Constitutional: Negative for chills and fever.  Respiratory: Negative for cough and shortness of breath.   Cardiovascular: Negative for  chest pain, palpitations and leg swelling.  Gastrointestinal: Negative for abdominal pain, nausea and vomiting.  Neurological: Positive for dizziness.     OBJECTIVE:  Blood pressure 122/80, pulse 90, temperature 99.1 F (37.3 C), temperature source Oral, height 5' 1.02" (1.55 m), weight 204 lb 9.6 oz (92.8 kg), SpO2 97 %. Body mass index is 38.63 kg/m.   BP Readings from Last 3 Encounters:  04/17/18 122/80  04/12/18 117/71  03/23/18 132/68    Physical Exam  Constitutional: She is oriented to person, place, and time. She appears well-developed and well-nourished.  HENT:  Head: Normocephalic and  atraumatic.  Mouth/Throat: Oropharynx is clear and moist. No oropharyngeal exudate.  Eyes: Pupils are equal, round, and reactive to light. EOM are normal. No scleral icterus.  Neck: Neck supple.  Cardiovascular: Normal rate, regular rhythm and normal heart sounds. Exam reveals no gallop and no friction rub.  No murmur heard. Pulmonary/Chest: Effort normal and breath sounds normal. She has no wheezes. She has no rales.  Musculoskeletal: She exhibits no edema.  Neurological: She is alert and oriented to person, place, and time. Gait abnormal.  Skin: Skin is warm and dry.  Psychiatric: She has a normal mood and affect.  Nursing note and vitals reviewed.   ASSESSMENT and PLAN  1. Dizziness Stable. No significant improvement since our last visit. Continue with PT as scheduled  2. Orthostatic hypotension Not improved. Stop amlodipine. Continue with home monitoring of BP  3. Microcytic anemia Follow-up with GI as scheduled for completion of capsule EGD. Regular egd and colonoscopy normal.   Return in about 6 weeks (around 05/29/2018).    Myles LippsIrma M Santiago, MD [facility] [address] [place], [place] [address] Ph.  [phone] Fax [phone]

## 2018-04-17 NOTE — Patient Instructions (Addendum)
1. Stop taking amlodipine. Continue checking BP at home and during PT. Please let me know if you continue to have orthostatic BP (ie significant dropping of BP with change in position) or if BP starts to raise. Continue to follow a DASH diet.    IF you received an x-ray today, you will receive an invoice from [facility]. Please contact [facility] at [phone] with questions or concerns regarding your invoice.   IF you received labwork today, you will receive an invoice from [facility]. Please contact [facility] at [phone] with questions or concerns regarding your invoice.   Our billing staff will not be able to assist you with questions regarding bills from these companies.  You will be contacted with the lab results as soon as they are available. The fastest way to get your results is to activate your My Chart account. Instructions are located on the last page of this paperwork. If you have not heard from us regarding the results in 2 weeks, please contact this office.     DASH Eating Plan DASH stands for "Dietary Approaches to Stop Hypertension." The DASH eating plan is a healthy eating plan that has been shown to reduce high blood pressure (hypertension). It may also reduce your risk for type 2 diabetes, heart disease, and stroke. The DASH eating plan may also help with weight loss. What are tips for following this plan? General guidelines  Avoid eating more than 2,300 mg (milligrams) of salt (sodium) a day. If you have hypertension, you may need to reduce your sodium intake to 1,500 mg a day.  Limit alcohol intake to no more than 1 drink a day for nonpregnant women and 2 drinks a day for men. One drink equals 12 oz of beer, 5 oz of wine, or 1 oz of hard liquor.  Work with your health care provider to maintain a healthy body weight or to lose weight. Ask what an ideal weight is for you.  Get at least 30 minutes of exercise that causes your heart to beat faster  (aerobic exercise) most days of the week. Activities may include walking, swimming, or biking.  Work with your health care provider or diet and nutrition specialist (dietitian) to adjust your eating plan to your individual calorie needs. Reading food labels  Check food labels for the amount of sodium per serving. Choose foods with less than 5 percent of the Daily Value of sodium. Generally, foods with less than 300 mg of sodium per serving fit into this eating plan.  To find whole grains, look for the word "whole" as the first word in the ingredient list. Shopping  Buy products labeled as "low-sodium" or "no salt added."  Buy fresh foods. Avoid canned foods and premade or frozen meals. Cooking  Avoid adding salt when cooking. Use salt-free seasonings or herbs instead of table salt or sea salt. Check with your health care provider or pharmacist before using salt substitutes.  Do not fry foods. Cook foods using healthy methods such as baking, boiling, grilling, and broiling instead.  Cook with heart-healthy oils, such as olive, canola, soybean, or sunflower oil. Meal planning   Eat a balanced diet that includes: ? 5 or more servings of fruits and vegetables each day. At each meal, try to fill half of your plate with fruits and vegetables. ? Up to 6-8 servings of whole grains each day. ? Less than 6 oz of lean meat, poultry, or fish each day. A 3-oz serving of meat is about  the same size as a deck of cards. One egg equals 1 oz. ? 2 servings of low-fat dairy each day. ? A serving of nuts, seeds, or beans 5 times each week. ? Heart-healthy fats. Healthy fats called Omega-3 fatty acids are found in foods such as flaxseeds and coldwater fish, like sardines, salmon, and mackerel.  Limit how much you eat of the following: ? Canned or prepackaged foods. ? Food that is high in trans fat, such as fried foods. ? Food that is high in saturated fat, such as fatty meat. ? Sweets, desserts, sugary  drinks, and other foods with added sugar. ? Full-fat dairy products.  Do not salt foods before eating.  Try to eat at least 2 vegetarian meals each week.  Eat more home-cooked food and less restaurant, buffet, and fast food.  When eating at a restaurant, ask that your food be prepared with less salt or no salt, if possible. What foods are recommended? The items listed may not be a complete list. Talk with your dietitian about what dietary choices are best for you. Grains Whole-grain or whole-wheat bread. Whole-grain or whole-wheat pasta. Brown rice. Orpah Cobb. Bulgur. Whole-grain and low-sodium cereals. Pita bread. Low-fat, low-sodium crackers. Whole-wheat flour tortillas. Vegetables Fresh or frozen vegetables (raw, steamed, roasted, or grilled). Low-sodium or reduced-sodium tomato and vegetable juice. Low-sodium or reduced-sodium tomato sauce and tomato paste. Low-sodium or reduced-sodium canned vegetables. Fruits All fresh, dried, or frozen fruit. Canned fruit in natural juice (without added sugar). Meat and other protein foods Skinless chicken or [place]. Ground chicken or [place]. Pork with fat trimmed off. Fish and seafood. Egg whites. Dried beans, peas, or lentils. Unsalted nuts, nut butters, and seeds. Unsalted canned beans. Lean cuts of beef with fat trimmed off. Low-sodium, lean deli meat. Dairy Low-fat (1%) or fat-free (skim) milk. Fat-free, low-fat, or reduced-fat cheeses. Nonfat, low-sodium ricotta or cottage cheese. Low-fat or nonfat yogurt. Low-fat, low-sodium cheese. Fats and oils Soft margarine without trans fats. Vegetable oil. Low-fat, reduced-fat, or light mayonnaise and salad dressings (reduced-sodium). Canola, safflower, olive, soybean, and sunflower oils. Avocado. Seasoning and other foods Herbs. Spices. Seasoning mixes without salt. Unsalted popcorn and pretzels. Fat-free sweets. What foods are not recommended? The items listed may not be a complete list. Talk  with your dietitian about what dietary choices are best for you. Grains Baked goods made with fat, such as croissants, muffins, or some breads. Dry pasta or rice meal packs. Vegetables Creamed or fried vegetables. Vegetables in a cheese sauce. Regular canned vegetables (not low-sodium or reduced-sodium). Regular canned tomato sauce and paste (not low-sodium or reduced-sodium). Regular tomato and vegetable juice (not low-sodium or reduced-sodium). Rosita Fire. Olives. Fruits Canned fruit in a light or heavy syrup. Fried fruit. Fruit in cream or butter sauce. Meat and other protein foods Fatty cuts of meat. Ribs. Fried meat. Tomasa Blase. Sausage. Bologna and other processed lunch meats. Salami. Fatback. Hotdogs. Bratwurst. Salted nuts and seeds. Canned beans with added salt. Canned or smoked fish. Whole eggs or egg yolks. Chicken or [place] with skin. Dairy Whole or 2% milk, cream, and half-and-half. Whole or full-fat cream cheese. Whole-fat or sweetened yogurt. Full-fat cheese. Nondairy creamers. Whipped toppings. Processed cheese and cheese spreads. Fats and oils Butter. Stick margarine. Lard. Shortening. Ghee. Bacon fat. Tropical oils, such as coconut, palm kernel, or palm oil. Seasoning and other foods Salted popcorn and pretzels. Onion salt, garlic salt, seasoned salt, table salt, and sea salt. Worcestershire sauce. Tartar sauce. Barbecue sauce. Teriyaki sauce. Soy sauce,  including reduced-sodium. Steak sauce. Canned and packaged gravies. Fish sauce. Oyster sauce. Cocktail sauce. Horseradish that you find on the shelf. Ketchup. Mustard. Meat flavorings and tenderizers. Bouillon cubes. Hot sauce and Tabasco sauce. Premade or packaged marinades. Premade or packaged taco seasonings. Relishes. Regular salad dressings. Where to find more information:  National Heart, Lung, and Blood Institute: PopSteam.is  American Heart Association: www.heart.org Summary  The DASH eating plan is a healthy eating plan  that has been shown to reduce high blood pressure (hypertension). It may also reduce your risk for type 2 diabetes, heart disease, and stroke.  With the DASH eating plan, you should limit salt (sodium) intake to 2,300 mg a day. If you have hypertension, you may need to reduce your sodium intake to 1,500 mg a day.  When on the DASH eating plan, aim to eat more fresh fruits and vegetables, whole grains, lean proteins, low-fat dairy, and heart-healthy fats.  Work with your health care provider or diet and nutrition specialist (dietitian) to adjust your eating plan to your individual calorie needs. This information is not intended to replace advice given to you by your health care provider. Make sure you discuss any questions you have with your health care provider. Document Released: 08/25/2011 Document Revised: 08/29/2016 Document Reviewed: 08/29/2016 Elsevier Interactive Patient Education  [employer].

## 2018-04-18 ENCOUNTER — Other Ambulatory Visit: Payer: Self-pay | Admitting: Family Medicine

## 2018-04-20 ENCOUNTER — Encounter

## 2018-04-20 ENCOUNTER — Ambulatory Visit: Payer: 59 | Admitting: Internal Medicine

## 2018-04-26 ENCOUNTER — Encounter: Payer: Self-pay | Admitting: Physical Therapy

## 2018-04-26 ENCOUNTER — Ambulatory Visit: Payer: 59 | Attending: Family Medicine | Admitting: Physical Therapy

## 2018-04-26 DIAGNOSIS — M6281 Muscle weakness (generalized): Secondary | ICD-10-CM | POA: Insufficient documentation

## 2018-04-26 DIAGNOSIS — R42 Dizziness and giddiness: Secondary | ICD-10-CM | POA: Insufficient documentation

## 2018-04-26 DIAGNOSIS — R2689 Other abnormalities of gait and mobility: Secondary | ICD-10-CM | POA: Diagnosis present

## 2018-04-26 NOTE — Therapy (Signed)
[facility] [address] [place], [place], [address] Phone: [phone]   Fax:  [phone]  Physical Therapy Treatment  Patient Details  Name: RAMONA RUARK MRN: [id number] Date of Birth: 1951/05/04 Referring Provider: Dr. Leretha Pol   Encounter Date: 04/26/2018  PT End of Session - 04/26/18 0953    Visit Number  3    Number of Visits  17    Date for PT Re-Evaluation  06/02/18    Authorization Type  [employer]: limited to 60 visits between PT, OT, and Speech    PT Start Time  0801    PT Stop Time  0851    PT Time Calculation (min)  50 min       Past Medical History:  Diagnosis Date  . Anemia, unspecified   . Hyperlipidemia, unspecified   . Hypertension   . Hypothyroidism, unspecified   . Pre-diabetes   . Vitamin B12 deficiency     Past Surgical History:  Procedure Laterality Date  . ABDOMINAL HYSTERECTOMY    . GIVENS CAPSULE STUDY N/A 04/16/2018   Procedure: GIVENS CAPSULE STUDY;  Surgeon: Jeani Hawking, MD;  Location: [facility] ENDOSCOPY;  Service: Endoscopy;  Laterality: N/A;  . SHOULDER SURGERY      There were no vitals filed for this visit.  Subjective Assessment - 04/26/18 0859    Subjective  Pt states her [facility] nurse was able to talk to her doctor and have one of her BP meds discontinued which she thinks has helped to reduce the dizziness. Pt reports mild dizziness at this time - rates intensity 2-3/10; states she is really trying to get better - practices walking fast with her walker in her home; has been doing the letter exercise and says it is helping - she does not get as dizzy doing ex. as she did when she first started    Pertinent History  HTN, thyroid disease, DM (pre-diabetes now?), HLD    Patient Stated Goals  To get back to normal    Currently in Pain?  No/denies                       [facility] Adult PT Treatment/Exercise - 04/26/18 0813      Ambulation/Gait   Ambulation/Gait  Yes     Ambulation/Gait Assistance  4: Min guard    Ambulation Distance (Feet)  40 Feet    Assistive device  None    Gait Pattern  Decreased dorsiflexion - right;Decreased dorsiflexion - left;Lateral trunk lean to right;Lateral trunk lean to left    Ambulation Surface  Level;Indoor    Gait Comments  Pt performed 2 reps 40' each - with horizontal head turns and then with vertical head turns; pt initially was moving eyes downward with no cervical flexion - able to increase head movement with cues;  pt reported       Standardized Balance Assessment   Standardized Balance Assessment  Berg Balance Test      Berg Balance Test   Sit to Stand  Able to stand without using hands and stabilize independently    Standing Unsupported  Able to stand safely 2 minutes    Sitting with Back Unsupported but Feet Supported on Floor or Stool  Able to sit safely and securely 2 minutes    Stand to Sit  Sits safely with minimal use of hands    Transfers  Able to transfer safely, minor use of hands    Standing Unsupported with  Eyes Closed  Able to stand 10 seconds with supervision    Standing Ubsupported with Feet Together  Able to place feet together independently and stand 1 minute safely    From Standing, Reach Forward with Outstretched Arm  Can reach confidently >25 cm (10")    From Standing Position, Pick up Object from Floor  Able to pick up shoe, needs supervision    From Standing Position, Turn to Look Behind Over each Shoulder  Looks behind one side only/other side shows less weight shift    Turn 360 Degrees  Able to turn 360 degrees safely one side only in 4 seconds or less    Standing Unsupported, Alternately Place Feet on Step/Stool  Able to stand independently and safely and complete 8 steps in 20 seconds    Standing Unsupported, One Foot in Front  Able to place foot tandem independently and hold 30 seconds    Standing on One Leg  Able to lift leg independently and hold 5-10 seconds    Total Score  51        NeuroRe-ed: Pt performed standing balance exercises at counter for HEP; forward, back and side kicks 10 reps each;  Marching in place 10 reps each; marching forward 1 rep along counter, then 1 rep backward along counter; Sidestepping x 1 rep; added crossovers front 1 rep and stepping behind with UE support  Standing with feet together with EC 10 secs; added horizontal and vertical head turns 5 reps each direction (performed in corner for safety) Tandem stance 30 sec hold x 1 rep;   SLS on each foot 10 sec hold   Pt performed standing in corner - trunk rotation with touching hand to opposite wall 5 reps for habituation of dizziness with turning     PT Education - 04/26/18 0952    Education Details  balance exercises added to HEP - see pt instructions    Person(s) Educated  Patient    Methods  Explanation;Demonstration;Handout    Comprehension  Verbalized understanding;Returned demonstration                 Plan - 04/26/18 0954    Clinical Impression Statement  Pt's Berg score 51/56, indicative of minimal problems with static standing balance; pt appears to have more problems with dynamic standing balance and also some difficulty with SLS on RLE.  Pt appears to have decreased vestibular input in maintaining balance as evidenced by unsteadiness with EC; pt reports slightly increased dizziness in standing with head turns with EC.  Rehab Potential  Good    Clinical Impairments Affecting Rehab Potential  see above    PT Frequency  2x / week    PT Duration  8 weeks    PT Treatment/Interventions  ADLs/Self Care Home Management;Biofeedback;Canalith Repostioning;Electrical Stimulation;Therapeutic activities;Therapeutic exercise;Functional mobility training;Stair training;Gait training;DME  Instruction;Balance training;Neuromuscular re-education;Patient/family education;Orthotic Fit/Training;Manual techniques;Vestibular    PT Next Visit Plan  check balance HEP given on 04-26-18; continue dynamic gait and balance activities, with increased vestibular input    PT Home Exercise Plan  x1 viewing in standing; added standing balance HEP on 04-26-18    Consulted and Agree with Plan of Care  Patient       Patient will benefit from skilled therapeutic intervention in order to improve the following deficits and impairments:  Abnormal gait, Decreased endurance, Obesity, Decreased knowledge of use of DME, Decreased strength, Decreased balance, Decreased mobility, Dizziness, Impaired flexibility  Visit Diagnosis: Other abnormalities of gait and mobility  Dizziness and giddiness     Problem List Patient Active Problem List   Diagnosis Date Noted  . Vitamin B12 deficiency 02/06/2018  . Sepsis secondary to UTI (HCC) 01/29/2018  . Microcytic anemia 01/29/2018  . Renal insufficiency 01/29/2018  . Pure hypercholesterolemia 12/24/2014  . Diabetes mellitus type 2, controlled, without complications (HCC) 12/24/2014  . Bruit of right carotid artery 12/24/2014  . Thyroid disease 07/06/2014  . Severe obesity (BMI >= 40) (HCC) 07/06/2014  . HTN (hypertension) 07/06/2014    Norwin Aleman, Donavan Burnet, PT 04/26/2018, 10:02 AM  [facility] [address] [place], [place], [address] Phone: [phone]   Fax:  [phone]  Name: AUTUMNE KALLIO MRN: [id number] Date of Birth: [birth year]

## 2018-04-26 NOTE — Patient Instructions (Signed)
Standing Marching   Using a chair if necessary, march in place. Repeat 10 imes. Do 1 sessions per day.  http://gt2.exer.us/344   Copyright  VHI. All rights reserved.   Hip Backward Kick   Using a chair for balance, keep legs shoulder width apart and toes pointed for- ward. Slowly extend one leg back, keeping knee straight. Do not lean forward. Repeat with other leg. Repeat 10 times. Do 1 sessions per day.  http://gt2.exer.us/340   Copyright  VHI. All rights reserved.     Hip Side Kick   Holding a chair for balance, keep legs shoulder width apart and toes pointed forward. Swing a leg out to side, keeping knee straight. Do not lean. Repeat using other leg. Repeat 10 times. Do 1 sessions per day.  http://gt2.exer.us/342   Copyright  VHI. All rights reserved.   Feet Heel-Toe "Tandem", Varied Arm Positions - Eyes Open   With eyes open, right foot directly in front of the other, arms out, look straight ahead at a stationary object. Hold 30 seconds. Repeat 1 times per session. Do 1 sessions per day.  Copyright  VHI. All rights reserved.       Standing On One Leg Without Support .  Stand on one leg in neutral spine without support. Hold 10 seconds. Repeat on other leg. Do 2  repetitions, 1 sets.  http://bt.exer.us/36   Copyright  VHI. All rights reserved.    Side-Stepping   Walk to left side with eyes open. Take even steps, leading with same foot. Make sure each foot lifts off the floor. Repeat in opposite direction. Repeat 2-3 times along counter. Do 1 sessions per day.   AMBULATION: Walk Backward   Walk backward. Take large steps, do not drag feet. 3 reps per set, 1 -2 sets per day, 5 days per week Use assistive device.      Braiding   Move to side: 1) cross right leg in front of left, 2) bring back leg out to side, then 3) cross right leg behind left, 4) bring left leg out to side. Continue sequence in same direction. Reverse sequence, moving  in opposite direction. Repeat sequence 1 times per session. Do1  sessions per day.     STAND IN CORNER WITH FEET TOGETHER - WITH EYES CLOSED;  TURN HEAD SIDE TO SIDE 5-10 TIMES, THEN UP/DOWN 5-10 TIMES; HAVE CHAIR IN FRONT FOR SAFETY   SIT TO STAND -- STEP & PIVOT TURN TO EACH SIDE - DO 5 TIMES TO EACH SIDE

## 2018-04-27 ENCOUNTER — Ambulatory Visit: Payer: 59

## 2018-04-27 DIAGNOSIS — R2689 Other abnormalities of gait and mobility: Secondary | ICD-10-CM | POA: Diagnosis not present

## 2018-04-27 DIAGNOSIS — M6281 Muscle weakness (generalized): Secondary | ICD-10-CM

## 2018-04-27 DIAGNOSIS — R42 Dizziness and giddiness: Secondary | ICD-10-CM

## 2018-04-27 NOTE — Therapy (Signed)
[facility] [address] [place], [place], [address] Phone: [phone]   Fax:  [phone]  Physical Therapy Treatment  Patient Details  Name: Anna FiremanMary A Gartman MRN: [id number] Date of Birth: 10/31/1950 Referring Provider: Dr. Leretha PolSantiago   Encounter Date: 04/27/2018  PT End of Session - 04/27/18 0933    Visit Number  4    Number of Visits  17    Date for PT Re-Evaluation  06/02/18    Authorization Type  UHC: limited to 60 visits between PT, OT, and Speech    PT Start Time  0848   session not started until 0852 as pt in bathroom   PT Stop Time  0930    PT Time Calculation (min)  42 min    Equipment Utilized During Treatment  --   S prn   Activity Tolerance  Patient tolerated treatment well;No increased pain    Behavior During Therapy  WFL for tasks assessed/performed       Past Medical History:  Diagnosis Date  . Anemia, unspecified   . Hyperlipidemia, unspecified   . Hypertension   . Hypothyroidism, unspecified   . Pre-diabetes   . Vitamin B12 deficiency     Past Surgical History:  Procedure Laterality Date  . ABDOMINAL HYSTERECTOMY    . GIVENS CAPSULE STUDY N/A 04/16/2018   Procedure: GIVENS CAPSULE STUDY;  Surgeon: Jeani HawkingHung, Patrick, MD;  [facility];  Service: Endoscopy;  Laterality: N/A;  . SHOULDER SURGERY      There were no vitals filed for this visit.  Subjective Assessment - 04/27/18 0853    Subjective  Pt denied falls or changes since last visit.     Patient is accompained by:  Family member   husband in lobby   Pertinent History  HTN, thyroid disease, DM (pre-diabetes now?), HLD    Patient Stated Goals  To get back to normal    Currently in Pain?  No/denies         Therex and neuro re-ed: Pt performed in // bars or from chair with S for safety. Cues and demo for technique. No incr. In pain. Please see pt instructions for HEP details.               [facility] Adult PT  Treatment/Exercise - 04/27/18 0931      Ambulation/Gait   Ambulation/Gait  Yes    Ambulation/Gait Assistance  5: Supervision    Ambulation/Gait Assistance Details  Cues to improve arm swing, antalgic gait, and stride length. PT discussed trial different ADs next session.    Ambulation Distance (Feet)  300 Feet    Assistive device  None    Gait Pattern  Decreased dorsiflexion - right;Decreased dorsiflexion - left;Lateral trunk lean to right;Lateral trunk lean to left;Decreased arm swing - right;Decreased arm swing - left;Decreased stride length;Trendelenburg    Ambulation Surface  Level;Indoor      Vestibular Treatment/Exercise - 04/27/18 0932      Vestibular Treatment/Exercise   Vestibular Treatment Provided  Gaze    Gaze Exercises  X1 Viewing Horizontal;X1 Viewing Vertical      X1 Viewing Horizontal   Foot Position  feet apart/together    Time  --   30-60 sec.   Reps  2    Comments  Cues for technique with feet together. Please see pt instructions for HEP details.      X1 Viewing Vertical   Foot Position  feet apart/together    Time  --  30-60 sec.   Reps  2    Comments  Please see above.             PT Education - 04/27/18 0933    Education Details  PT reviewed HEP added yesterday, as pt had technique questions. PT added two add'l strengthening exercises to HEP.     Person(s) Educated  Patient    Methods  Explanation;Demonstration;Verbal cues;Handout    Comprehension  Returned demonstration;Verbalized understanding       PT Short Term Goals - 04/03/18 1207      PT SHORT TERM GOAL #1   Title  Pt will be IND in HEP to improve deficits listed above. TARGET DATE FOR ALL STGS: 05/01/18    Status  New      PT SHORT TERM GOAL #2   Title  Perform BERG and write STG/LTGs as indicated.     Status  New      PT SHORT TERM GOAL #3   Title  Pt will improve gait speed with LRAD to >/=1.29ft/sec to decr. falls risk.     Status  New      PT SHORT TERM GOAL #4   Title  Pt  will amb. 200' over even terrain with LRAD at MOD I level to improve functional mobility.     Status  New      PT SHORT TERM GOAL #5   Title  Finish vestibular exam and write goals indicated.     Status  New        PT Long Term Goals - 04/03/18 1209      PT LONG TERM GOAL #1   Title  Pt will improve gait speed with LRAD to >/=2.60ft/sec. to safely amb. in the community. TARGET DATE FOR ALL LTGS: 05/29/18    Status  New      PT LONG TERM GOAL #2   Title  Pt will amb. 400' over even/paved surfaces with LRAD at MOD I level to  improve functional mobility.     Status  New      PT LONG TERM GOAL #3   Title  Pt will perform STS txfs with </=2/10 dizziness and no LOB to safely perform txfs at home.     Status  New            Plan - 04/27/18 5284    Clinical Impression Statement  Skilled session focused on reviewing HEP and adding to HEP. Pt demonstrated progress, as she tolerated progression of gaze stabilization HEP. PT discussed trialing AD next session to improve gait deviations. PT will hold checking goals until week of 05/07/18 as pt missed one week of PT 2/2 scheduling conflicts. Pt would continue to benefit from skilled PT to improve safety during functional mobility.     Rehab Potential  Good    Clinical Impairments Affecting Rehab Potential  see above    PT Frequency  2x / week    PT Duration  8 weeks    PT Treatment/Interventions  ADLs/Self Care Home Management;Biofeedback;Canalith Repostioning;Electrical Stimulation;Therapeutic activities;Therapeutic exercise;Functional mobility training;Stair training;Gait training;DME Instruction;Balance training;Neuromuscular re-education;Patient/family education;Orthotic Fit/Training;Manual techniques;Vestibular    PT Next Visit Plan  Gait with AD (trial canes or rollator) continue dynamic gait and balance activities, with increased vestibular input. Check STGs week of 05/07/18.    PT Home Exercise Plan  x1 viewing in standing; added  standing balance HEP on 04-26-18    Consulted and Agree with Plan of Care  Patient  Patient will benefit from skilled therapeutic intervention in order to improve the following deficits and impairments:  Abnormal gait, Decreased endurance, Obesity, Decreased knowledge of use of DME, Decreased strength, Decreased balance, Decreased mobility, Dizziness, Impaired flexibility  Visit Diagnosis: Other abnormalities of gait and mobility  Dizziness and giddiness  Muscle weakness (generalized)     Problem List Patient Active Problem List   Diagnosis Date Noted  . Vitamin B12 deficiency 02/06/2018  . Sepsis secondary to UTI (HCC) 01/29/2018  . Microcytic anemia 01/29/2018  . Renal insufficiency 01/29/2018  . Pure hypercholesterolemia 12/24/2014  . Diabetes mellitus type 2, controlled, without complications (HCC) 12/24/2014  . Bruit of right carotid artery 12/24/2014  . Thyroid disease 07/06/2014  . Severe obesity (BMI >= 40) (HCC) 07/06/2014  . HTN (hypertension) 07/06/2014    Kimesha Claxton L 04/27/2018, 9:38 AM  [facility] [address] [place], [place], [address] Phone: [phone]   Fax:  [phone]  Name: LEITH HEDLUND MRN: [id number] Date of Birth: 1950-11-05  Zerita Boers, PT,DPT 04/27/18 9:39 AM Phone: [phone] Fax: [phone]

## 2018-04-27 NOTE — Patient Instructions (Addendum)
Standing Marching   Using a chair if necessary, march in place. Repeat 10 imes. Do 1 sessions per day.  http://gt2.exer.us/344   Copyright  VHI. All rights reserved.   Hip Backward Kick   Using a chair for balance, keep legs shoulder width apart and toes pointed for- ward. Slowly extend one leg back, keeping knee straight. Do not lean forward. Repeat with other leg. Repeat 10 times. Do 1 sessions per day.  http://gt2.exer.us/340   Copyright  VHI. All rights reserved.     Hip Side Kick   Holding a chair for balance, keep legs shoulder width apart and toes pointed forward. Swing a leg out to side, keeping knee straight. Do not lean. Repeat using other leg. Repeat 10 times. Do 1 sessions per day.  http://gt2.exer.us/342   Copyright  VHI. All rights reserved.   Feet Heel-Toe "Tandem", Varied Arm Positions - Eyes Open   With eyes open, right foot directly in front of the other, arms out, look straight ahead at a stationary object. Hold 30 seconds. Repeat 1 times per session. Do 1 sessions per day.  Copyright  VHI. All rights reserved.       Standing On One Leg Without Support .  Stand on one leg in neutral spine without support. Hold 10 seconds. Repeat on other leg. Do 2  repetitions, 1 sets.  http://bt.exer.us/36   Copyright  VHI. All rights reserved.    Side-Stepping   Walk to left side with eyes open. Take even steps, leading with same foot. Make sure each foot lifts off the floor. Repeat in opposite direction. Repeat 2-3 times along counter. Do 1 sessions per day.   AMBULATION: Walk Backward   Walk backward. Take large steps, do not drag feet. 3 reps per set, 1 -2 sets per day, 5 days per week Use assistive device.      Braiding   Move to side: 1) cross right leg in front of left, 2) bring back leg out to side, then 3) cross right leg behind left, 4) bring left leg out to side. Continue sequence in same direction. Reverse  sequence, moving in opposite direction. Repeat sequence 1 times per session. Do1  sessions per day.     STAND IN CORNER WITH FEET TOGETHER - WITH EYES CLOSED;  TURN HEAD SIDE TO SIDE 5-10 TIMES, THEN UP/DOWN 5-10 TIMES; HAVE CHAIR IN FRONT FOR SAFETY  Functional Quadriceps: Sit to Stand    Sit on edge of chair, feet flat on floor. Stand upright, extending knees fully. Repeat __10__ times per set. Do __1__ sets per session. Do __1__ sessions per day, perform 5 times a week.   http://orth.exer.us/735   Copyright  VHI. All rights reserved.    Hamstring Curl    Hands on counter, with body against counter. Shift weight onto right leg, while bending left knee. Repeat with other leg.  Repeat __10_ times, alternating legs. Perform 2 sets.  Do _3__ times per week.  Copyright  VHI. All rights reserved.   Gaze Stabilization: Tip Card  1.Target must remain in focus, not blurry, and appear stationary while head is in motion. 2.Perform exercises with small head movements (45 to either side of midline). 3.Increase speed of head motion so long as target is in focus. 4.If you wear eyeglasses, be sure you can see target through lens (therapist will give specific instructions for bifocal / progressive lenses). 5.These exercises may provoke dizziness or nausea. Work through these symptoms. If too dizzy, slow head movement  slightly. Rest between each exercise. 6.Exercises demand concentration; avoid distractions. 7.For safety, perform standing exercises close to a counter, wall, corner, or next to someone.   Gaze Stabilization: Standing Feet Apart    Feet shoulder width apart, keeping eyes on target on wall __6__ feet away, tilt head down 15-30 and move head side to side for __60__ seconds. Repeat while moving head up and down for _60___ seconds. Do _3___ sessions per day.  Gaze Stabilization: Standing Feet Together    Feet together, keeping eyes on target on wall __3-5__ feet  away, tilt head down 15-30 and move head side to side for __20-30__ seconds. Repeat while moving head up and down for _20-30___ seconds. Do __2__ sessions per day.  Copyright  VHI. All rights reserved.    LETTER ON PLAIN BACKGROUND

## 2018-04-28 ENCOUNTER — Other Ambulatory Visit: Payer: Self-pay | Admitting: Family Medicine

## 2018-05-04 ENCOUNTER — Ambulatory Visit: Payer: 59

## 2018-05-04 DIAGNOSIS — M6281 Muscle weakness (generalized): Secondary | ICD-10-CM

## 2018-05-04 DIAGNOSIS — R2689 Other abnormalities of gait and mobility: Secondary | ICD-10-CM | POA: Diagnosis not present

## 2018-05-04 NOTE — Therapy (Signed)
[facility] [address] [place], [place], [address] Phone: [phone]   Fax:  [phone]  Physical Therapy Treatment  Patient Details  Name: Anna Bernard MRN: [id number] Date of Birth: 03/10/1951 Referring Provider: Dr. Leretha PolSantiago   Encounter Date: 05/04/2018  PT End of Session - 05/04/18 1018    Visit Number  5    Number of Visits  17    Date for PT Re-Evaluation  06/02/18    Authorization Type  UHC: limited to 60 visits between PT, OT, and Speech    PT Start Time  0848    PT Stop Time  0927    PT Time Calculation (min)  39 min    Equipment Utilized During Treatment  --   min guard to S   Activity Tolerance  Patient tolerated treatment well    Behavior During Therapy  [facility] for tasks assessed/performed       Past Medical History:  Diagnosis Date  . Anemia, unspecified   . Hyperlipidemia, unspecified   . Hypertension   . Hypothyroidism, unspecified   . Pre-diabetes   . Vitamin B12 deficiency     Past Surgical History:  Procedure Laterality Date  . ABDOMINAL HYSTERECTOMY    . GIVENS CAPSULE STUDY N/A 04/16/2018   Procedure: GIVENS CAPSULE STUDY;  Surgeon: Jeani HawkingHung, Patrick, MD;  [facility];  Service: Endoscopy;  Laterality: N/A;  . SHOULDER SURGERY      There were no vitals filed for this visit.  Subjective Assessment - 05/04/18 0852    Subjective  Pt denied falls. Pt had a bad cold but feels better now. Pt brought SPC and SPC with quad tip.     Patient is accompained by:  --   husband in lobby   Pertinent History  HTN, thyroid disease, DM (pre-diabetes now?), HLD    Patient Stated Goals  To get back to normal    Currently in Pain?  No/denies                       [facility] - 05/04/18 0853      Transfers   Transfers  Sit to Stand;Stand to Sit    Sit to Stand  5: Supervision;With upper extremity assist;From chair/3-in-1    Sit to Stand Details  (indicate cue type and reason)  Cues and demo for technique with rollator. x2 reps.     Stand to Sit  5: Supervision;With upper extremity assist;To chair/3-in-1    Stand to Sit Details  Cues and demo for technique and safety with rollator.       Ambulation/Gait   Ambulation/Gait  Yes    Ambulation/Gait Assistance  4: Min guard;4: Min assist    Ambulation/Gait Assistance Details  Cues and demo for sequencing with SPC and SPC with quad tip.Provided handout on rollator.     Ambulation Distance (Feet)  115 Feet   x3, 75'x2 indoors and 400' outdoors with [facility] and rollator   Assistive device  Straight cane;Other (Comment);Rollator   SPC with quad tip   Gait Pattern  Decreased dorsiflexion - right;Decreased dorsiflexion - left;Lateral trunk lean to right;Lateral trunk lean to left;Decreased arm swing - right;Decreased arm swing - left;Decreased stride length;Trendelenburg    Ambulation Surface  Level;Indoor             PT Education - 05/04/18 1018    Education Details  PT trialed different AD and educated that rollator was most safe  and pt able to amb. with less gait deviations. PT provided pt handout of rollator to purchase.     Person(s) Educated  Patient    Methods  Explanation;Handout    Comprehension  Verbalized understanding       PT Short Term Goals - 04/03/18 1207      PT SHORT TERM GOAL #1   Title  Pt will be IND in HEP to improve deficits listed above. TARGET DATE FOR ALL STGS: 05/01/18    Status  New      PT SHORT TERM GOAL #2   Title  Perform BERG and write STG/LTGs as indicated.     Status  New      PT SHORT TERM GOAL #3   Title  Pt will improve gait speed with LRAD to >/=1.298ft/sec to decr. falls risk.     Status  New      PT SHORT TERM GOAL #4   Title  Pt will amb. 200' over even terrain with LRAD at MOD I level to improve functional mobility.     Status  New      PT SHORT TERM GOAL #5   Title  Finish vestibular exam and write goals indicated.     Status  New         PT Long Term Goals - 04/03/18 1209      PT LONG TERM GOAL #1   Title  Pt will improve gait speed with LRAD to >/=2.6062ft/sec. to safely amb. in the community. TARGET DATE FOR ALL LTGS: 05/29/18    Status  New      PT LONG TERM GOAL #2   Title  Pt will amb. 400' over even/paved surfaces with LRAD at MOD I level to  improve functional mobility.     Status  New      PT LONG TERM GOAL #3   Title  Pt will perform STS txfs with </=2/10 dizziness and no LOB to safely perform txfs at home.     Status  New            Plan - 05/04/18 1019    Clinical Impression Statement  Pt experienced incr. postural sway and gait deviations during amb. with SPC and SPC with quad tip. However, gait deviations and balance improved with rollator. Pt noted to amb. with less trendelberg gait and improved heel strike with rollator and incr. speed.  Pt agreeable. Pt required rest breaks 2/2 fatigue. Continue with POC.     Rehab Potential  Good    Clinical Impairments Affecting Rehab Potential  see above    PT Frequency  2x / week    PT Duration  8 weeks    PT Treatment/Interventions  ADLs/Self Care Home Management;Biofeedback;Canalith Repostioning;Electrical Stimulation;Therapeutic activities;Therapeutic exercise;Functional mobility training;Stair training;Gait training;DME Instruction;Balance training;Neuromuscular re-education;Patient/family education;Orthotic Fit/Training;Manual techniques;Vestibular    PT Next Visit Plan  STGs. Gait with rollator and gait speed. continue dynamic gait and balance activities, with increased vestibular input. Check STGs week of 05/07/18.    PT Home Exercise Plan  x1 viewing in standing; added standing balance HEP on 04-26-18    Consulted and Agree with Plan of Care  Patient       Patient will benefit from skilled therapeutic intervention in order to improve the following deficits and impairments:  Abnormal gait, Decreased endurance, Obesity, Decreased knowledge of use of  DME, Decreased strength, Decreased balance, Decreased mobility, Dizziness, Impaired flexibility  Visit Diagnosis: Other abnormalities of gait and mobility  Muscle  weakness (generalized)     Problem List Patient Active Problem List   Diagnosis Date Noted  . Vitamin B12 deficiency 02/06/2018  . Sepsis secondary to UTI (HCC) 01/29/2018  . Microcytic anemia 01/29/2018  . Renal insufficiency 01/29/2018  . Pure hypercholesterolemia 12/24/2014  . Diabetes mellitus type 2, controlled, without complications (HCC) 12/24/2014  . Bruit of right carotid artery 12/24/2014  . Thyroid disease 07/06/2014  . Severe obesity (BMI >= 40) (HCC) 07/06/2014  . HTN (hypertension) 07/06/2014    Anna Bernard 05/04/2018, 10:21 AM   [facility] [address] [place], [place], [address] Phone: [phone]   Fax:  [phone]  Name: Anna Bernard MRN: [id number] Date of Birth: Jul 28, 1951  Zerita Boers, PT,DPT 05/04/18 10:21 AM Phone: [phone] Fax: [phone]

## 2018-05-07 ENCOUNTER — Encounter: Payer: Self-pay | Admitting: Physical Therapy

## 2018-05-07 ENCOUNTER — Ambulatory Visit: Payer: 59 | Admitting: Physical Therapy

## 2018-05-07 DIAGNOSIS — M6281 Muscle weakness (generalized): Secondary | ICD-10-CM

## 2018-05-07 DIAGNOSIS — R2689 Other abnormalities of gait and mobility: Secondary | ICD-10-CM | POA: Diagnosis not present

## 2018-05-07 NOTE — Therapy (Signed)
[place] [address] [place], [place], [address] Phone: [phone]   Fax:  [phone]  Physical Therapy Treatment  Patient Details  Name: Anna Bernard MRN: [id number] Date of Birth: 08/17/1951 Referring Provider: Dr. Pamella Pert   Encounter Date: 05/07/2018  PT End of Session - 05/07/18 1134    Visit Number  6    Number of Visits  17    Date for PT Re-Evaluation  06/02/18    Authorization Type  UHC: limited to 60 visits between PT, OT, and Speech    PT Start Time  0801    PT Stop Time  0848    PT Time Calculation (min)  47 min       Past Medical History:  Diagnosis Date  . Anemia, unspecified   . Hyperlipidemia, unspecified   . Hypertension   . Hypothyroidism, unspecified   . Pre-diabetes   . Vitamin B12 deficiency     Past Surgical History:  Procedure Laterality Date  . ABDOMINAL HYSTERECTOMY    . GIVENS CAPSULE STUDY N/A 04/16/2018   Procedure: GIVENS CAPSULE STUDY;  Surgeon: Carol Ada, MD;  Location: [place];  Service: Endoscopy;  Laterality: N/A;  . SHOULDER SURGERY      There were no vitals filed for this visit.  Subjective Assessment - 05/07/18 1119    Subjective  Pt states she went to her sister's house last Friday and made herself walk up the uneven incline to her house without using cane - was careful & cautious but had no problems:  states she did need cane at family reunion on Saturday as she had to walk on very uneven ground.  Pt states she ordered rollator - says it is still in box at home (has not been assembled yet)    Pertinent History  HTN, thyroid disease, DM (pre-diabetes now?), HLD    Patient Stated Goals  To get back to normal    Currently in Pain?  No/denies                       [facility] Adult PT Treatment/Exercise - 05/07/18 0816      Ambulation/Gait   Ambulation/Gait  Yes    Ambulation/Gait Assistance  5: Supervision    Ambulation/Gait Assistance  Details  pt declined use of cane; trialed use of heel wedge in left shoe due to leg length discrepancy with LLE shorter than RLE - by observation by comparison with pt in supine position     Ambulation Distance (Feet)  100 Feet    Assistive device  None    Gait Pattern  Trendelenburg    Ambulation Surface  Level;Indoor    Gait velocity  10.87 1st rep;  9.78 2nd rep; = 3.02 / 3.35 ft/sec without device     Gait Comments  Slight improvement in gait with less Trendelenburg on LLE with use of heel wedge; wedge removed for comparison; pt was instructed to use heel wedge intermittently at home and determine if she felt gait was improved (pt stated LLE felt higher when wedge was initially placed in shoe)           Balance Exercises - 05/07/18 1126      Balance Exercises: Standing   Standing Eyes Opened  Wide (BOA);Head turns;Foam/compliant surface;Solid surface;5 reps    Standing Eyes Closed  Wide (BOA);Head turns;Foam/compliant surface;Solid surface;5 reps   horizontal and vertical head turns   Other Standing Exercises  Pt performed standing  on incline - added horizontal head turns 5 reps with CGA:  vertical head turns 5 reps with CGA;  marching in place with no head movement with CGA to min assist;  head turns with EO with marching with min to mod assist - horizontal and vertical head turns 5 reps each      Self Care - discussed progress towards STG's and use of rollator for assistance with outdoor ambulation, especially  on uneven terrain   PT Education - 05/07/18 1132    Education Details  Discussed progress towards STG's and use of rollator - pt states she would use RW to amb. outside in her back yard - recommended that rollator be assembled so that she could begin this activity; pt states she would continue with SPC (or no device) inside home    Person(s) Educated  Patient    Methods  Explanation    Comprehension  Verbalized understanding       PT Short Term Goals - 05/07/18 1497       PT SHORT TERM GOAL #1   Title  Pt will be IND in HEP to improve deficits listed above. TARGET DATE FOR ALL STGS: 05/01/18    Baseline  Pt reports compliance with current exercises given to date- 05-07-18; standing on compliant surface with EO and EC added to HEP on 05-07-18    Status  On-going      PT SHORT TERM GOAL #2   Title  Perform BERG and write STG/LTGs as indicated.     Baseline  score 51/56 on 04-26-18    Status  Deferred      PT SHORT TERM GOAL #3   Title  Pt will improve gait speed with LRAD to >/=1.16f/sec to decr. falls risk.     Baseline  3.02/3.35 ft/sec without device on 05-07-18    Status  Achieved      PT SHORT TERM GOAL #4   Title  Pt will amb. 200' over even terrain with LRAD at MOD I level to improve functional mobility.     Status  Not Met      PT SHORT TERM GOAL #5   Title  Finish vestibular exam and write goals indicated.     Status  Deferred        PT Long Term Goals - 04/03/18 1209      PT LONG TERM GOAL #1   Title  Pt will improve gait speed with LRAD to >/=2.629fsec. to safely amb. in the community. TARGET DATE FOR ALL LTGS: 05/29/18    Status  New      PT LONG TERM GOAL #2   Title  Pt will amb. 400' over even/paved surfaces with LRAD at MOD I level to  improve functional mobility.     Status  New      PT LONG TERM GOAL #3   Title  Pt will perform STS txfs with </=2/10 dizziness and no LOB to safely perform txfs at home.     Status  New            Plan - 05/07/18 1138    Clinical Impression Statement  Pt has met STG #3:  STG #1 is ongoing as HEP has been updated to include standing on compliant surface:  STG's #2 and 5 have been deferred:  STG#4 is ongoing as pt is not ambulating outside on uneven surface modified independently - states she has purchased rollator but has not had it assembled yet.  Pt  appears to have LLD with LLE shorter than RLE - use of heel wedge was trialed and appears to decrease Trendelenburg gait pattern slightly but not  significantly.  Pt was instructed to use heel wedge at home intermittently and determine if she felt it was beneficial.  Pt's balance deficits appear to be due to decreased vestibular input in maintaining balance.      Rehab Potential  Good    PT Frequency  2x / week    PT Duration  8 weeks    PT Treatment/Interventions  ADLs/Self Care Home Management;Biofeedback;Canalith Repostioning;Electrical Stimulation;Therapeutic activities;Therapeutic exercise;Functional mobility training;Stair training;Gait training;DME Instruction;Balance training;Neuromuscular re-education;Patient/family education;Orthotic Fit/Training;Manual techniques;Vestibular    PT Next Visit Plan  assess benefit of heel wedge in Lt shoe (pt has wedge); do SOT? -- Continue balance/vestibular activities     PT Home Exercise Plan  x1 viewing in standing; added standing balance HEP on 04-26-18    Consulted and Agree with Plan of Care  Patient       Patient will benefit from skilled therapeutic intervention in order to improve the following deficits and impairments:  Abnormal gait, Decreased endurance, Obesity, Decreased knowledge of use of DME, Decreased strength, Decreased balance, Decreased mobility, Dizziness, Impaired flexibility  Visit Diagnosis: Other abnormalities of gait and mobility  Muscle weakness (generalized)     Problem List Patient Active Problem List   Diagnosis Date Noted  . Vitamin B12 deficiency 02/06/2018  . Sepsis secondary to UTI ([place]) 01/29/2018  . Microcytic anemia 01/29/2018  . Renal insufficiency 01/29/2018  . Pure hypercholesterolemia 12/24/2014  . Diabetes mellitus type 2, controlled, without complications ([place]) 61/60/7371  . Bruit of right carotid artery 12/24/2014  . Thyroid disease 07/06/2014  . Severe obesity (BMI >= 40) ([place]) 07/06/2014  . HTN (hypertension) 07/06/2014    Zayvon Alicea, [place], PT 05/07/2018, 1:11 PM  [place] [address] [place], [place], [address] Phone: [phone]   Fax:  [phone]  Name: Anna Bernard MRN: [id number] Date of Birth: [birth year]

## 2018-05-07 NOTE — Patient Instructions (Signed)
Feet Apart (Compliant Surface) Head Motion - Eyes OPEN FIRST -- then with EYES CLOSED    Stand on compliant surface: _pillows_______ with feet shoulder width apart. Close eyes and move head slowly, up and down. Repeat __1__ times per session. Do __1-2__ sessions per day.  DO 8-10 head turns - each way - side to side and up/down

## 2018-05-08 ENCOUNTER — Telehealth: Payer: Self-pay | Admitting: Family Medicine

## 2018-05-08 NOTE — Telephone Encounter (Signed)
Copied from CRM [phone]. Topic: General - Other >> May 08, 2018  2:03 PM Oneal GroutSebastian, Jennifer S wrote: Reason for CRM: Requesting results of MRI

## 2018-05-11 ENCOUNTER — Ambulatory Visit: Payer: 59

## 2018-05-11 DIAGNOSIS — R42 Dizziness and giddiness: Secondary | ICD-10-CM

## 2018-05-11 DIAGNOSIS — M6281 Muscle weakness (generalized): Secondary | ICD-10-CM

## 2018-05-11 DIAGNOSIS — R2689 Other abnormalities of gait and mobility: Secondary | ICD-10-CM | POA: Diagnosis not present

## 2018-05-11 NOTE — Patient Instructions (Signed)
  Orthostatic hypotension readings:  Supine: 118/2272mmHg and HR: 77bpm Seated: 119/1972mmHg and HR: 80bpm Standing: 112/360mmHg and HR: 87bpm (with lightheadedness)

## 2018-05-11 NOTE — Therapy (Signed)
[facility] [address] [place], [place], [address] Phone: [phone]   Fax:  [phone]  Physical Therapy Evaluation  Patient Details  Name: Anna Bernard MRN: [id number] Date of Birth: [birth year] Referring Provider: Dr. Leretha Pol   Encounter Date: 05/11/2018  PT End of Session - 05/11/18 1232    Visit Number  7    Number of Visits  17    Date for PT Re-Evaluation  06/02/18    Authorization Type  UHC: limited to 60 visits between PT, OT, and Speech    PT Start Time  0849    PT Stop Time  0934    PT Time Calculation (min)  45 min    Equipment Utilized During Treatment  --   SOT harness and S for safety   Activity Tolerance  Patient tolerated treatment well    Behavior During Therapy  [facility] for tasks assessed/performed       Past Medical History:  Diagnosis Date  . Anemia, unspecified   . Hyperlipidemia, unspecified   . Hypertension   . Hypothyroidism, unspecified   . Pre-diabetes   . Vitamin B12 deficiency     Past Surgical History:  Procedure Laterality Date  . ABDOMINAL HYSTERECTOMY    . GIVENS CAPSULE STUDY N/A 04/16/2018   Procedure: GIVENS CAPSULE STUDY;  Surgeon: Jeani Hawking, MD;  [facility] END[facility];  Service: Endoscopy;  Laterality: N/A;  . SHOULDER SURGERY      There were no vitals filed for this visit.   Subjective Assessment - 05/11/18 0852    Subjective  Pt reported she fell out of rolling kitchen chair last night, as she went to sit down but chair wasn't secure. Pt reported chair is low and broke the fall with her buttocks, denied injury or hitting her head. Pt reported she's been using L heel wedge but feels like she needs another one is right shoe. Pt stated that [facility] care manager wanted PT to assess orthostatics again.     Patient is accompained by:  [name]: sitting in lobby   Pertinent History  HTN, thyroid disease, DM (pre-diabetes now?), HLD    Patient Stated Goals  To get  back to normal    Currently in Pain?  No/denies       Neuro re-ed: Neuro re-ed: sensory organization test performed with following results: Conditions: 1: WNL 2: 2 fall trials and 1 WNL 3: WNL  4: 3 trials below normal 5: 3 fall trials 6: WNL Composite score: 59 Sensory Analysis Som: WNL (~95) Vis: Below normal (~74) Vest: Below normal (~1) Pref: WNL but skewed 2/2 0-1 vestibular input Strategy analysis: Good use of hip/ankle strategies, except during condition 5 (no use of hips).       COG alignment: L ant. bias               Vestibular Assessment - 05/11/18 0900      Orthostatics   BP supine (x 5 minutes)  118/72    HR supine (x 5 minutes)  77    BP sitting  119/72    HR sitting  80   no lightheadedness reported during seated position   BP standing (after 1 minute)  112/60    HR standing (after 1 minute)  87   lightheadedness   Orthostatics Comment  Pt reported slight lightheadedness upon standing.          Objective measurements completed on examination: See above findings.  [facility] Adult PT Treatment/Exercise - 05/11/18 1223      Ambulation/Gait   Ambulation/Gait  Yes    Ambulation/Gait Assistance  5: Supervision    Ambulation/Gait Assistance Details  Pt amb. with 0.25", 0.5" heel wedges and no heel wedges. Pt's gait deviation slightly improved with use of 0.25" heel wedge.     Ambulation Distance (Feet)  300 Feet    Assistive device  None    Gait Pattern  Trendelenburg;Step-through pattern;Decreased stride length;Lateral trunk lean to left;Lateral trunk lean to right;Decreased arm swing - right;Decreased arm swing - left;Decreased dorsiflexion - left;Decreased dorsiflexion - right    Ambulation Surface  Indoor;Level             PT Education - 05/11/18 1225    Education Details  PT educated pt on why the L heel wedge is beneficial but that pt might require a deeper shoe. PT also educated pt on using rollator to improve safety and gait  deviations. PT educated pt on SOT results. PT provided pt with orthostatic results to [facility].     Person(s) Educated  Patient    Methods  Explanation    Comprehension  Verbalized understanding       PT Short Term Goals - 05/07/18 [id number]      PT SHORT TERM GOAL #1   Title  Pt will be IND in HEP to improve deficits listed above. TARGET DATE FOR ALL STGS: 05/01/18    Baseline  Pt reports compliance with current exercises given to date- 05-07-18; standing on compliant surface with EO and EC added to HEP on 05-07-18    Status  On-going      PT SHORT TERM GOAL #2   Title  Perform BERG and write STG/LTGs as indicated.     Baseline  score 51/56 on 04-26-18    Status  Deferred      PT SHORT TERM GOAL #3   Title  Pt will improve gait speed with LRAD to >/=1.588ft/sec to decr. falls risk.     Baseline  3.02/3.35 ft/sec without device on 05-07-18    Status  Achieved      PT SHORT TERM GOAL #4   Title  Pt will amb. 200' over even terrain with LRAD at MOD I level to improve functional mobility.     Baseline  Pt not amb. modified independently outside on even terrain as of 05-07-18    Status  On-going      PT SHORT TERM GOAL #5   Title  Finish vestibular exam and write goals indicated.     Status  Deferred        PT Long Term Goals - 04/03/18 1209      PT LONG TERM GOAL #1   Title  Pt will improve gait speed with LRAD to >/=2.2662ft/sec. to safely amb. in the community. TARGET DATE FOR ALL LTGS: 05/29/18    Status  New      PT LONG TERM GOAL #2   Title  Pt will amb. 400' over even/paved surfaces with LRAD at MOD I level to  improve functional mobility.     Status  New      PT LONG TERM GOAL #3   Title  Pt will perform STS txfs with </=2/10 dizziness and no LOB to safely perform txfs at home.     Status  New             Plan - 05/11/18 1233  Clinical Impression Statement  Pt's SOT composite score is below normal limits for her age group (60-69y/o). SOT also  indicated that visual and vestibular inputs were below normal (especially vestibular). Pt had decr. hip strategy during conditions 5 and 6, which incr. postural sway and LOB. Pt experienced lightheadedness and significant decr. in diastolic BP during orthostatic testing (during sit to stand), which could be contributing to dizziness. Pt continues to experience incr. gait deviations during amb. without rollator and L heel wedge, therefore, PT educated pt on the importance of using both. Continue with POC.     Rehab Potential  Good    PT Frequency  2x / week    PT Duration  8 weeks    PT Treatment/Interventions  ADLs/Self Care Home Management;Biofeedback;Canalith Repostioning;Electrical Stimulation;Therapeutic activities;Therapeutic exercise;Functional mobility training;Stair training;Gait training;DME Instruction;Balance training;Neuromuscular re-education;Patient/family education;Orthotic Fit/Training;Manual techniques;Vestibular    PT Next Visit Plan   Continue balance/vestibular activities and gait with rollator.     PT Home Exercise Plan  x1 viewing in standing; added standing balance HEP on 04-26-18    Consulted and Agree with Plan of Care  Patient       Patient will benefit from skilled therapeutic intervention in order to improve the following deficits and impairments:  Abnormal gait, Decreased endurance, Obesity, Decreased knowledge of use of DME, Decreased strength, Decreased balance, Decreased mobility, Dizziness, Impaired flexibility  Visit Diagnosis: Other abnormalities of gait and mobility  Dizziness and giddiness  Muscle weakness (generalized)     Problem List Patient Active Problem List   Diagnosis Date Noted  . Vitamin B12 deficiency 02/06/2018  . Sepsis secondary to UTI (HCC) 01/29/2018  . Microcytic anemia 01/29/2018  . Renal insufficiency 01/29/2018  . Pure hypercholesterolemia 12/24/2014  . Diabetes mellitus type 2, controlled, without complications (HCC) 12/24/2014   . Bruit of right carotid artery 12/24/2014  . Thyroid disease 07/06/2014  . Severe obesity (BMI >= 40) (HCC) 07/06/2014  . HTN (hypertension) 07/06/2014    Miller,Jennifer L 05/11/2018, 12:37 PM  [facility] [address] [place], [place], [address] Phone: [phone]   Fax:  [phone]  Name: Anna Bernard MRN: [id number] Date of Birth: April 27, 1951  Zerita Boers, PT,DPT 05/11/18 12:39 PM Phone: [phone] Fax: [phone]

## 2018-05-14 ENCOUNTER — Encounter: Payer: Self-pay | Admitting: Physical Therapy

## 2018-05-14 ENCOUNTER — Ambulatory Visit: Payer: 59 | Admitting: Physical Therapy

## 2018-05-14 VITALS — BP 84/56 | HR 92

## 2018-05-14 DIAGNOSIS — R2689 Other abnormalities of gait and mobility: Secondary | ICD-10-CM

## 2018-05-14 DIAGNOSIS — R42 Dizziness and giddiness: Secondary | ICD-10-CM

## 2018-05-14 NOTE — Patient Instructions (Signed)
UPON TRANSFERRING FROM LYING DOWN TO SITTING UP - DO ANKLE PUMPS WHILE YOU COUNT TO 10  UPON STANDING -MARCH IN PLACE WHILE YOU COUNT TO 10

## 2018-05-15 NOTE — Therapy (Signed)
[facility] [address] [place], [place], [address] Phone: [phone]   Fax:  [phone]  Physical Therapy Treatment  Patient Details  Name: Anna Bernard MRN: [id number] Date of Birth: Jun 10, 1951 Referring Provider: Dr. Leretha Pol   Encounter Date: 05/14/2018  PT End of Session - 05/15/18 1943    Visit Number  8    Number of Visits  17    Date for PT Re-Evaluation  06/02/18    Authorization Type  [employer]: limited to 60 visits between PT, OT, and Speech    PT Start Time  0801    PT Stop Time  0845    PT Time Calculation (min)  44 min       Past Medical History:  Diagnosis Date  . Anemia, unspecified   . Hyperlipidemia, unspecified   . Hypertension   . Hypothyroidism, unspecified   . Pre-diabetes   . Vitamin B12 deficiency     Past Surgical History:  Procedure Laterality Date  . ABDOMINAL HYSTERECTOMY    . GIVENS CAPSULE STUDY N/A 04/16/2018   Procedure: GIVENS CAPSULE STUDY;  Surgeon: Jeani Hawking, MD;  Location: [facility] ENDOSCOPY;  Service: Endoscopy;  Laterality: N/A;  . SHOULDER SURGERY      Vitals:   05/14/18 0816 05/14/18 0820 05/14/18 0822 05/14/18 0824  BP: (!) 102/56 (!) 95/58 112/66 (!) 84/56  Pulse: 82 88 81 92                                              Seated              Supine               Standing                      seated   Self care; above BP readings recorded in noted positions (per pt's request for [facility] nurse);  Pt instructed to perform Ankle pumps in seated position and then to do marching in place upon initial standing prior to initiating ambulation                [facility] Adult PT Treatment/Exercise - 05/15/18 0001      Transfers   Transfers  Sit to Stand    Sit to Stand  4: Min guard;5: Supervision    Number of Reps  Other reps (comment)   5     Ambulation/Gait   Ambulation/Gait  Yes    Ambulation/Gait Assistance  5: Supervision    Ambulation Distance (Feet)  150  Feet    Assistive device  None   pt brought SPC but did not use   Gait Pattern  Trendelenburg;Step-through pattern;Decreased stride length;Lateral trunk lean to left;Lateral trunk lean to right;Decreased arm swing - right;Decreased arm swing - left;Decreased dorsiflexion - left;Decreased dorsiflexion - right    Ambulation Surface  Level;Indoor          Balance Exercises - 05/15/18 1941      Balance Exercises: Standing   Standing Eyes Opened  Narrow base of support (BOS);Head turns;Solid surface;5 reps    Rockerboard  Anterior/posterior;Head turns;EO;EC;10 reps;Intermittent UE support    Gait with Head Turns  Forward;Retro;3 reps   inside // bars with UE support prn   Other Standing Exercises  Pt performed braiding inside // bars with UE  support prn - 2 reps; Pt performed standing with EC with head turns horizontal and then with vertical 5 reps each with CGA without UE support on // bar          PT Short Term Goals - 05/07/18 0981      PT SHORT TERM GOAL #1   Title  Pt will be IND in HEP to improve deficits listed above. TARGET DATE FOR ALL STGS: 05/01/18    Baseline  Pt reports compliance with current exercises given to date- 05-07-18; standing on compliant surface with EO and EC added to HEP on 05-07-18    Status  On-going      PT SHORT TERM GOAL #2   Title  Perform BERG and write STG/LTGs as indicated.     Baseline  score 51/56 on 04-26-18    Status  Deferred      PT SHORT TERM GOAL #3   Title  Pt will improve gait speed with LRAD to >/=1.26ft/sec to decr. falls risk.     Baseline  3.02/3.35 ft/sec without device on 05-07-18    Status  Achieved      PT SHORT TERM GOAL #4   Title  Pt will amb. 200' over even terrain with LRAD at MOD I level to improve functional mobility.     Baseline  Pt not amb. modified independently outside on even terrain as of 05-07-18    Status  On-going      PT SHORT TERM GOAL #5   Title  Finish vestibular exam and write goals indicated.     Status   Deferred        PT Long Term Goals - 04/03/18 1209      PT LONG TERM GOAL #1   Title  Pt will improve gait speed with LRAD to >/=2.22ft/sec. to safely amb. in the community. TARGET DATE FOR ALL LTGS: 05/29/18    Status  New      PT LONG TERM GOAL #2   Title  Pt will amb. 400' over even/paved surfaces with LRAD at MOD I level to  improve functional mobility.     Status  New      PT LONG TERM GOAL #3   Title  Pt will perform STS txfs with </=2/10 dizziness and no LOB to safely perform txfs at home.     Status  New            Plan - 05/15/18 1944    Clinical Impression Statement  Pt has more difficulty maintaining balance with EC, both with standing on compliant and noncompliant surfaces - indicative of decreased vestibular input.  Pt reported that she is not doing HEP of standing on pillows for compliant surface training on regular basis - emphasized importance of doing these exercises regularly for incr. progress    Rehab Potential  Good    Clinical Impairments Affecting Rehab Potential  see above    PT Frequency  2x / week    PT Duration  8 weeks    PT Treatment/Interventions  ADLs/Self Care Home Management;Biofeedback;Canalith Repostioning;Electrical Stimulation;Therapeutic activities;Therapeutic exercise;Functional mobility training;Stair training;Gait training;DME Instruction;Balance training;Neuromuscular re-education;Patient/family education;Orthotic Fit/Training;Manual techniques;Vestibular    PT Next Visit Plan   Continue balance/vestibular activities and gait with rollator.     PT Home Exercise Plan  x1 viewing in standing; added standing balance HEP on 04-26-18    Consulted and Agree with Plan of Care  Patient       Patient will benefit from skilled  therapeutic intervention in order to improve the following deficits and impairments:  Abnormal gait, Decreased endurance, Obesity, Decreased knowledge of use of DME, Decreased strength, Decreased balance, Decreased mobility,  Dizziness, Impaired flexibility  Visit Diagnosis: Other abnormalities of gait and mobility  Dizziness and giddiness     Problem List Patient Active Problem List   Diagnosis Date Noted  . Vitamin B12 deficiency 02/06/2018  . Sepsis secondary to UTI (HCC) 01/29/2018  . Microcytic anemia 01/29/2018  . Renal insufficiency 01/29/2018  . Pure hypercholesterolemia 12/24/2014  . Diabetes mellitus type 2, controlled, without complications (HCC) 12/24/2014  . Bruit of right carotid artery 12/24/2014  . Thyroid disease 07/06/2014  . Severe obesity (BMI >= 40) (HCC) 07/06/2014  . HTN (hypertension) 07/06/2014    Doc Mandala, Donavan BurnetLinda Suzanne, PT 05/15/2018, 7:49 PM  [facility] [address] [place], [place], [address] Phone: [phone]   Fax:  [phone]  Name: Anna Bernard MRN: [id number] Date of Birth: 03/21/1951

## 2018-05-17 NOTE — Telephone Encounter (Signed)
Patient given results Voiced understanding

## 2018-05-18 ENCOUNTER — Ambulatory Visit: Payer: 59

## 2018-05-18 VITALS — BP 149/81 | HR 109

## 2018-05-18 DIAGNOSIS — R2689 Other abnormalities of gait and mobility: Secondary | ICD-10-CM | POA: Diagnosis not present

## 2018-05-18 DIAGNOSIS — M6281 Muscle weakness (generalized): Secondary | ICD-10-CM

## 2018-05-18 DIAGNOSIS — R42 Dizziness and giddiness: Secondary | ICD-10-CM

## 2018-05-18 NOTE — Therapy (Signed)
[facility] [address] [place], [place], [address] Phone: [phone]   Fax:  [phone]  Physical Therapy Treatment  Patient Details  Name: Anna Bernard MRN: [id number] Date of Birth: [birth year] Referring Provider: Dr. Leretha Pol   Encounter Date: 05/18/2018  PT End of Session - 05/18/18 0928    Visit Number  9    Number of Visits  17    Date for PT Re-Evaluation  06/02/18    Authorization Type  UHC: limited to 60 visits between PT, OT, and Speech    PT Start Time  [phone]   pt in gym but front didn't check pt in right away   PT Stop Time  0927    PT Time Calculation (min)  40 min    Equipment Utilized During Treatment  --   min guard to S   Activity Tolerance  Patient tolerated treatment well    Behavior During Therapy  [facility] for tasks assessed/performed       Past Medical History:  Diagnosis Date  . Anemia, unspecified   . Hyperlipidemia, unspecified   . Hypertension   . Hypothyroidism, unspecified   . Pre-diabetes   . Vitamin B12 deficiency     Past Surgical History:  Procedure Laterality Date  . ABDOMINAL HYSTERECTOMY    . GIVENS CAPSULE STUDY N/A 04/16/2018   Procedure: GIVENS CAPSULE STUDY;  Surgeon: Jeani Hawking, MD;  Location: [facility] ENDOSCOPY;  Service: Endoscopy;  Laterality: N/A;  . SHOULDER SURGERY      Vitals:   05/18/18 0857 05/18/18 0921  BP: 132/77 (!) 149/81  Pulse: 86 (!) 109    Subjective Assessment - 05/18/18 0852    Subjective  Pt reported her case manage wants PT to continue to take orthostatic BP readings until further notice. Pt reports CT was normal.     Pertinent History  HTN, thyroid disease, DM (pre-diabetes now?), HLD    Patient Stated Goals  To get back to normal    Currently in Pain?  No/denies             Vestibular Assessment - 05/18/18 0900      Orthostatics   BP supine (x 5 minutes)  132/77    HR supine (x 5 minutes)  86    BP sitting  130/78   slight  dizziness reported (3/10)   HR sitting  92    BP standing (after 1 minute)  116/70   pt reported dizziness (3/10)   HR standing (after 1 minute)  96    BP standing (after 3 minutes)  125/74   pt reported improved dizziness after standing   HR standing (after 3 minutes)  93               [facility] Adult PT Treatment/Exercise - 05/18/18 0912      Ambulation/Gait   Ambulation/Gait  Yes    Ambulation/Gait Assistance  5: Supervision    Ambulation/Gait Assistance Details  Pt amb. outdoors/indoors over paved inclines/declines with rollator. Improve gait deviations noted. Cues to improve weight shifting over inclines/declines. Pt amb. over even terrain indoors while performing head turns/nods.     Ambulation Distance (Feet)  1000 Feet    and 115'   Assistive device  Rollator    Gait Pattern  Trendelenburg;Step-through pattern;Decreased stride length;Lateral trunk lean to left;Lateral trunk lean to right;Decreased arm swing - right;Decreased arm swing - left;Decreased dorsiflexion - left;Decreased dorsiflexion - right    Ambulation Surface  Unlevel;Level;Indoor;Outdoor;Paved  PT Education - 05/18/18 [phone]    Education Details  PT encouraged pt to continue using rollator, and to trial it in the community to improve gait deviations. Provided pt orthostatic readings.     Person(s) Educated  Patient;Spouse    Methods  Explanation    Comprehension  Verbalized understanding       PT Short Term Goals - 05/07/18 0812      PT SHORT TERM GOAL #1   Title  Pt will be IND in HEP to improve deficits listed above. TARGET DATE FOR ALL STGS: 05/01/18    Baseline  Pt reports compliance with current exercises given to date- 05-07-18; standing on compliant surface with EO and EC added to HEP on 05-07-18    Status  On-going      PT SHORT TERM GOAL #2   Title  Perform BERG and write STG/LTGs as indicated.     Baseline  score 51/56 on 04-26-18    Status  Deferred      PT SHORT TERM GOAL #3    Title  Pt will improve gait speed with LRAD to >/=1.308ft/sec to decr. falls risk.     Baseline  3.02/3.35 ft/sec without device on 05-07-18    Status  Achieved      PT SHORT TERM GOAL #4   Title  Pt will amb. 200' over even terrain with LRAD at MOD I level to improve functional mobility.     Baseline  Pt not amb. modified independently outside on even terrain as of 05-07-18    Status  On-going      PT SHORT TERM GOAL #5   Title  Finish vestibular exam and write goals indicated.     Status  Deferred        PT Long Term Goals - 04/03/18 1209      PT LONG TERM GOAL #1   Title  Pt will improve gait speed with LRAD to >/=2.9762ft/sec. to safely amb. in the community. TARGET DATE FOR ALL LTGS: 05/29/18    Status  New      PT LONG TERM GOAL #2   Title  Pt will amb. 400' over even/paved surfaces with LRAD at MOD I level to  improve functional mobility.     Status  New      PT LONG TERM GOAL #3   Title  Pt will perform STS txfs with </=2/10 dizziness and no LOB to safely perform txfs at home.     Status  New            Plan - 05/18/18 0929    Clinical Impression Statement  Pt continues to experience decr. in BP during orthostatic testing, along with concordant dizziness. However, decr. in BP was not significant. Pt continues to demonstrate improvement in gait deviations during amb. with rollator vs. no AD, even while performing dynamic gait activities. Continue with POC.     Rehab Potential  Good    Clinical Impairments Affecting Rehab Potential  see above    PT Frequency  2x / week    PT Duration  8 weeks    PT Treatment/Interventions  ADLs/Self Care Home Management;Biofeedback;Canalith Repostioning;Electrical Stimulation;Therapeutic activities;Therapeutic exercise;Functional mobility training;Stair training;Gait training;DME Instruction;Balance training;Neuromuscular re-education;Patient/family education;Orthotic Fit/Training;Manual techniques;Vestibular    PT Next Visit Plan   Orthostatics [facility].  Continue balance/vestibular activities and gait with rollator.     PT Home Exercise Plan  x1 viewing in standing; added standing balance HEP on 04-26-18  Consulted and Agree with Plan of Care  Patient       Patient will benefit from skilled therapeutic intervention in order to improve the following deficits and impairments:  Abnormal gait, Decreased endurance, Obesity, Decreased knowledge of use of DME, Decreased strength, Decreased balance, Decreased mobility, Dizziness, Impaired flexibility  Visit Diagnosis: Dizziness and giddiness  Other abnormalities of gait and mobility  Muscle weakness (generalized)     Problem List Patient Active Problem List   Diagnosis Date Noted  . Vitamin B12 deficiency 02/06/2018  . Sepsis secondary to UTI (HCC) 01/29/2018  . Microcytic anemia 01/29/2018  . Renal insufficiency 01/29/2018  . Pure hypercholesterolemia 12/24/2014  . Diabetes mellitus type 2, controlled, without complications (HCC) 12/24/2014  . Bruit of right carotid artery 12/24/2014  . Thyroid disease 07/06/2014  . Severe obesity (BMI >= 40) (HCC) 07/06/2014  . HTN (hypertension) 07/06/2014    Lakishia Bourassa L 05/18/2018, 9:31 AM  [facility] [address] [place], [place], [address] Phone: [phone]   Fax:  [phone]  Name: CAITLINN KLINKER MRN: [id number] Date of Birth: May 29, 1951  Zerita Boers, PT,DPT 05/18/18 9:32 AM Phone: [phone] Fax: [phone]

## 2018-05-22 ENCOUNTER — Ambulatory Visit: Payer: 59 | Attending: Family Medicine

## 2018-05-22 DIAGNOSIS — R42 Dizziness and giddiness: Secondary | ICD-10-CM | POA: Insufficient documentation

## 2018-05-22 DIAGNOSIS — R2689 Other abnormalities of gait and mobility: Secondary | ICD-10-CM | POA: Diagnosis present

## 2018-05-22 NOTE — Therapy (Addendum)
[facility] [address] [place], [place], [address] Phone: [phone]   Fax:  [phone]  Physical Therapy Treatment  Patient Details  Name: Anna Bernard MRN: [id number] Date of Birth: 10-Jan-1951 Referring Provider: Dr. Leretha Pol   Encounter Date: 05/22/2018  PT End of Session - 05/22/18 0933    Visit Number  10    Number of Visits  17    Date for PT Re-Evaluation  06/02/18    Authorization Type  [employer]: limited to 60 visits between PT, OT, and Speech    PT Start Time  0848    PT Stop Time  0930    PT Time Calculation (min)  42 min    Equipment Utilized During Treatment  --   min guard to S prn   Activity Tolerance  Patient tolerated treatment well    Behavior During Therapy  [facility] for tasks assessed/performed       Past Medical History:  Diagnosis Date  . Anemia, unspecified   . Hyperlipidemia, unspecified   . Hypertension   . Hypothyroidism, unspecified   . Pre-diabetes   . Vitamin B12 deficiency     Past Surgical History:  Procedure Laterality Date  . ABDOMINAL HYSTERECTOMY    . GIVENS CAPSULE STUDY N/A 04/16/2018   Procedure: GIVENS CAPSULE STUDY;  Surgeon: Jeani Hawking, MD;  Location: [facility] ENDOSCOPY;  Service: Endoscopy;  Laterality: N/A;  . SHOULDER SURGERY      There were no vitals filed for this visit.  Subjective Assessment - 05/22/18 0851    Subjective  Pt reported she's still a little bit dizzy. Pt reported she did orthostatic testing at home and experience a big drop and dizziness. Pt denied falls since last visit. pt amb. at fast pace into gym.     Pertinent History  HTN, thyroid disease, DM (pre-diabetes now?), HLD    Patient Stated Goals  To get back to normal    Currently in Pain?  Yes    Pain Score  3     Pain Location  --   diffuse body ache (arthritis)   Pain Orientation  --   diffuse body pain   Pain Descriptors / Indicators  Aching    Pain Type  Chronic pain    Pain Onset  More  than a month ago    Pain Frequency  Intermittent    Aggravating Factors   unsure    Pain Relieving Factors  medication         [facility] PT Assessment - 05/22/18 0914      Functional Gait  Assessment   Gait assessed   Yes    Gait Level Surface  Walks 20 ft in less than 5.5 sec, no assistive devices, good speed, no evidence for imbalance, normal gait pattern, deviates no more than 6 in outside of the 12 in walkway width.    Change in Gait Speed  Able to change speed, demonstrates mild gait deviations, deviates 6-10 in outside of the 12 in walkway width, or no gait deviations, unable to achieve a major change in velocity, or uses a change in velocity, or uses an assistive device.    Gait with Horizontal Head Turns  Performs head turns smoothly with slight change in gait velocity (eg, minor disruption to smooth gait path), deviates 6-10 in outside 12 in walkway width, or uses an assistive device.    Gait with Vertical Head Turns  Performs task with slight change in gait velocity (eg,  minor disruption to smooth gait path), deviates 6 - 10 in outside 12 in walkway width or uses assistive device    Gait and Pivot Turn  Pivot turns safely within 3 sec and stops quickly with no loss of balance.    Step Over Obstacle  Is able to step over one shoe box (4.5 in total height) without changing gait speed. No evidence of imbalance.    Gait with Narrow Base of Support  Ambulates less than 4 steps heel to toe or cannot perform without assistance.    Gait with Eyes Closed  Walks 20 ft, uses assistive device, slower speed, mild gait deviations, deviates 6-10 in outside 12 in walkway width. Ambulates 20 ft in less than 9 sec but greater than 7 sec.    Ambulating Backwards  Walks 20 ft, uses assistive device, slower speed, mild gait deviations, deviates 6-10 in outside 12 in walkway width.    Steps  Alternating feet, must use rail.    Total Score  20    FGA comment:  20/30: indicates pt is at medium risk for falls.           Vestibular Assessment - 05/22/18 0859      Orthostatics   BP supine (x 5 minutes)  137/91   no dizziness in supine   HR supine (x 5 minutes)  84    BP sitting  130/76   no c/o dizziness   HR sitting  87    BP standing (after 1 minute)  108/57   pt reported 2-3/10 lightheadedness.   HR standing (after 1 minute)  91    BP standing (after 3 minutes)  113/66   pt reported dizziness resolved   HR standing (after 3 minutes)  89               [facility] Adult PT Treatment/Exercise - 05/22/18 0914      Ambulation/Gait   Ambulation/Gait  Yes    Ambulation/Gait Assistance  6: Modified independent (Device/Increase time)   MOD I with rollator and without AD 2/2 gait deviations   Ambulation/Gait Assistance Details  IND indoors    Ambulation Distance (Feet)  300 Feet   indoors no AD   Assistive device  Rollator;None    Gait Pattern  Trendelenburg;Step-through pattern;Decreased stride length;Lateral trunk lean to left;Lateral trunk lean to right;Decreased arm swing - right;Decreased arm swing - left;Decreased dorsiflexion - left;Decreased dorsiflexion - right    Ambulation Surface  Level;Indoor    Gait velocity  3.16ft/sec. no AD and 4.9ft/sec. with rollator             PT Education - 05/22/18 0932    Education Details  PT provided pt with orthostatic readings and discussed outcome measures.    Person(s) Educated  Patient    Methods  Explanation    Comprehension  Verbalized understanding       PT Short Term Goals - 05/07/18 4098      PT SHORT TERM GOAL #1   Title  Pt will be IND in HEP to improve deficits listed above. TARGET DATE FOR ALL STGS: 05/01/18    Baseline  Pt reports compliance with current exercises given to date- 05-07-18; standing on compliant surface with EO and EC added to HEP on 05-07-18    Status  On-going      PT SHORT TERM GOAL #2   Title  Perform BERG and write STG/LTGs as indicated.     Baseline  score 51/56 on 04-26-18  Status  Deferred       PT SHORT TERM GOAL #3   Title  Pt will improve gait speed with LRAD to >/=1.47ft/sec to decr. falls risk.     Baseline  3.02/3.35 ft/sec without device on 05-07-18    Status  Achieved      PT SHORT TERM GOAL #4   Title  Pt will amb. 200' over even terrain with LRAD at MOD I level to improve functional mobility.     Baseline  Pt not amb. modified independently outside on even terrain as of 05-07-18    Status  On-going      PT SHORT TERM GOAL #5   Title  Finish vestibular exam and write goals indicated.     Status  Deferred        PT Long Term Goals - 04/03/18 1209      PT LONG TERM GOAL #1   Title  Pt will improve gait speed with LRAD to >/=2.9ft/sec. to safely amb. in the community. TARGET DATE FOR ALL LTGS: 05/29/18    Status  New      PT LONG TERM GOAL #2   Title  Pt will amb. 400' over even/paved surfaces with LRAD at MOD I level to  improve functional mobility.     Status  New      PT LONG TERM GOAL #3   Title  Pt will perform STS txfs with </=2/10 dizziness and no LOB to safely perform txfs at home.     Status  New            Plan - 05/22/18 0933    Clinical Impression Statement  Pt continues to experience decr. in BP during orthostatic testing, and concordant dizziness. Decr. in BP today was significant and pt required standing rest breaks to allow dizziness to subside. Pt's FGA score indicates pt is at a medium risk for falls. Pt's gait speed has improved and gait deviations decr. with use of rollator. Continue with POC.     Rehab Potential  Good    Clinical Impairments Affecting Rehab Potential  see above    PT Frequency  2x / week    PT Duration  8 weeks    PT Treatment/Interventions  ADLs/Self Care Home Management;Biofeedback;Canalith Repostioning;Electrical Stimulation;Therapeutic activities;Therapeutic exercise;Functional mobility training;Stair training;Gait training;DME Instruction;Balance training;Neuromuscular re-education;Patient/family  education;Orthotic Fit/Training;Manual techniques;Vestibular    PT Next Visit Plan  Orthostatics each visit per [facility] case manager.  Continue balance/vestibular (high level) activities and gait with rollator. Goals week of 05/28/18    PT Home Exercise Plan  x1 viewing in standing; added standing balance HEP on 04-26-18    Consulted and Agree with Plan of Care  Patient       Patient will benefit from skilled therapeutic intervention in order to improve the following deficits and impairments:  Abnormal gait, Decreased endurance, Obesity, Decreased knowledge of use of DME, Decreased strength, Decreased balance, Decreased mobility, Dizziness, Impaired flexibility  Visit Diagnosis: Other abnormalities of gait and mobility  Dizziness and giddiness     Problem List Patient Active Problem List   Diagnosis Date Noted  . Vitamin B12 deficiency 02/06/2018  . Sepsis secondary to UTI (HCC) 01/29/2018  . Microcytic anemia 01/29/2018  . Renal insufficiency 01/29/2018  . Pure hypercholesterolemia 12/24/2014  . Diabetes mellitus type 2, controlled, without complications (HCC) 12/24/2014  . Bruit of right carotid artery 12/24/2014  . Thyroid disease 07/06/2014  . Severe obesity (BMI >= 40) (HCC) 07/06/2014  . HTN (  hypertension) 07/06/2014    Veleta Yamamoto L 05/22/2018, 9:45 AM  [facility] [address] [place], [place], [address] Phone: [phone]   Fax:  [phone]  Name: MARLEN KOMAN MRN: [id number] Date of Birth: 13-Mar-1951  Progress Note Reporting Period 04/03/18 to 05/22/18  See note above for Objective Data and Assessment of Progress/Goals.   Zerita Boers, PT,DPT 05/22/18 9:45 AM Phone: [phone] Fax: [phone]

## 2018-05-25 ENCOUNTER — Ambulatory Visit: Payer: 59

## 2018-05-25 DIAGNOSIS — R2689 Other abnormalities of gait and mobility: Secondary | ICD-10-CM

## 2018-05-25 DIAGNOSIS — R42 Dizziness and giddiness: Secondary | ICD-10-CM

## 2018-05-25 NOTE — Patient Instructions (Signed)
Friday: 05/25/18 Supine: BP: 134/87, HR: 87 Seated: BP: 119/73, 86 Standing 1 minute: BP: 110/72, 88 Standing 3 minutes: BP: 118/71, 92   [facility]: Orthotics and Prosthetics [address] [place] (off of [employer] [phone]

## 2018-05-25 NOTE — Therapy (Signed)
[facility] [address] [place], [place], [address] Phone: [phone]   Fax:  [phone]  Physical Therapy Treatment  Patient Details  Name: Anna Bernard MRN: [id number] Date of Birth: 1950-11-03 Referring Provider: Dr. Leretha Pol   Encounter Date: 05/25/2018  PT End of Session - 05/25/18 0934    Visit Number  11    Number of Visits  17    Date for PT Re-Evaluation  06/02/18    Authorization Type  UHC: limited to 60 visits between PT, OT, and Speech    PT Start Time  0848    PT Stop Time  0930    PT Time Calculation (min)  42 min    Equipment Utilized During Treatment  --   min A to S prn   Activity Tolerance  Patient tolerated treatment well    Behavior During Therapy  [facility] for tasks assessed/performed       Past Medical History:  Diagnosis Date  . Anemia, unspecified   . Hyperlipidemia, unspecified   . Hypertension   . Hypothyroidism, unspecified   . Pre-diabetes   . Vitamin B12 deficiency     Past Surgical History:  Procedure Laterality Date  . ABDOMINAL HYSTERECTOMY    . GIVENS CAPSULE STUDY N/A 04/16/2018   Procedure: GIVENS CAPSULE STUDY;  Surgeon: Jeani Hawking, MD;  Location: [facility] ENDOSCOPY;  Service: Endoscopy;  Laterality: N/A;  . SHOULDER SURGERY      There were no vitals filed for this visit.  Subjective Assessment - 05/25/18 0858    Subjective  Pt denied falls or changes since last visit. pt reported she's going to go to [facility] later and try to push the buggy vs. sitting in a chair.     Pertinent History  HTN, thyroid disease, DM (pre-diabetes now?), HLD    Patient Stated Goals  To get back to normal    Currently in Pain?  No/denies   but body feels stiff            Vestibular Assessment - 05/25/18 0858      Orthostatics   BP supine (x 5 minutes)  134/87   no dizziness   HR supine (x 5 minutes)  87    BP sitting  119/73    HR sitting  86    BP standing (after 1 minute)   110/72   pt reported dizziness upon standing   HR standing (after 1 minute)  88    BP standing (after 3 minutes)  118/71    HR standing (after 3 minutes)  92    Orthostatics Comment  Pt reported lightheadedness upon standing, which subsided after several minutes.                [facility] Adult PT Treatment/Exercise - 05/25/18 0931      High Level Balance   High Level Balance Activities  Side stepping;Braiding;Backward walking;Head turns;Marching forwards;Marching backwards;Other (comment)   forward amb. EO and EC (in // bars)   High Level Balance Comments  Pt performed head turns/nods, forward amb., sidestepping on red mats 4x20'/activity with min guard to min A for safety, cues for technique. Pt performed all other activities in // bars with 0-1 UE support and min guard to S for safety and cues for technique: 4x10'/activity.             PT Education - 05/25/18 0932    Education Details  PT provided pt with orthostatic readings and [facility] info,  as pt would like additional heel wedges for other shoes.     Person(s) Educated  Patient    Methods  Explanation;Handout    Comprehension  Verbalized understanding       PT Short Term Goals - 05/07/18 0812      PT SHORT TERM GOAL #1   Title  Pt will be IND in HEP to improve deficits listed above. TARGET DATE FOR ALL STGS: 05/01/18    Baseline  Pt reports compliance with current exercises given to date- 05-07-18; standing on compliant surface with EO and EC added to HEP on 05-07-18    Status  On-going      PT SHORT TERM GOAL #2   Title  Perform BERG and write STG/LTGs as indicated.     Baseline  score 51/56 on 04-26-18    Status  Deferred      PT SHORT TERM GOAL #3   Title  Pt will improve gait speed with LRAD to >/=1.64ft/sec to decr. falls risk.     Baseline  3.02/3.35 ft/sec without device on 05-07-18    Status  Achieved      PT SHORT TERM GOAL #4   Title  Pt will amb. 200' over even terrain with LRAD at MOD I level to  improve functional mobility.     Baseline  Pt not amb. modified independently outside on even terrain as of 05-07-18    Status  On-going      PT SHORT TERM GOAL #5   Title  Finish vestibular exam and write goals indicated.     Status  Deferred        PT Long Term Goals - 04/03/18 1209      PT LONG TERM GOAL #1   Title  Pt will improve gait speed with LRAD to >/=2.30ft/sec. to safely amb. in the community. TARGET DATE FOR ALL LTGS: 05/29/18    Status  New      PT LONG TERM GOAL #2   Title  Pt will amb. 400' over even/paved surfaces with LRAD at MOD I level to  improve functional mobility.     Status  New      PT LONG TERM GOAL #3   Title  Pt will perform STS txfs with </=2/10 dizziness and no LOB to safely perform txfs at home.     Status  New            Plan - 05/25/18 0934    Clinical Impression Statement  Pt continues to experience significant decr. in BP during orthostatic testing with concordant dizziness. Pt demonstrated progress, as she tolerated high level balance activities over compliant surfaces well with min A to S for safety. Continue with POC.     Rehab Potential  Good    Clinical Impairments Affecting Rehab Potential  see above    PT Frequency  2x / week    PT Duration  8 weeks    PT Treatment/Interventions  ADLs/Self Care Home Management;Biofeedback;Canalith Repostioning;Electrical Stimulation;Therapeutic activities;Therapeutic exercise;Functional mobility training;Stair training;Gait training;DME Instruction;Balance training;Neuromuscular re-education;Patient/family education;Orthotic Fit/Training;Manual techniques;Vestibular    PT Next Visit Plan  Orthostatics each visit per [facility] case manager.  CHECK LTGS. Continue balance/vestibular (high level) activities and gait with rollator.     PT Home Exercise Plan  x1 viewing in standing; added standing balance HEP on 04-26-18    Consulted and Agree with Plan of Care  Patient       Patient will benefit from skilled  therapeutic intervention in  order to improve the following deficits and impairments:  Abnormal gait, Decreased endurance, Obesity, Decreased knowledge of use of DME, Decreased strength, Decreased balance, Decreased mobility, Dizziness, Impaired flexibility  Visit Diagnosis: Dizziness and giddiness  Other abnormalities of gait and mobility     Problem List Patient Active Problem List   Diagnosis Date Noted  . Vitamin B12 deficiency 02/06/2018  . Sepsis secondary to UTI (HCC) 01/29/2018  . Microcytic anemia 01/29/2018  . Renal insufficiency 01/29/2018  . Pure hypercholesterolemia 12/24/2014  . Diabetes mellitus type 2, controlled, without complications (HCC) 12/24/2014  . Bruit of right carotid artery 12/24/2014  . Thyroid disease 07/06/2014  . Severe obesity (BMI >= 40) (HCC) 07/06/2014  . HTN (hypertension) 07/06/2014    Anai Lipson L 05/25/2018, 9:35 AM  [facility] [address] [place], [place], [address] Phone: [phone]   Fax:  [phone]  Name: Anna Bernard MRN: [id number] Date of Birth: 27-Mar-1951  Zerita Boers, PT,DPT 05/25/18 9:36 AM Phone: [phone] Fax: [phone]

## 2018-05-28 ENCOUNTER — Ambulatory Visit: Payer: 59 | Admitting: Physical Therapy

## 2018-05-28 ENCOUNTER — Encounter: Payer: Self-pay | Admitting: Physical Therapy

## 2018-05-28 VITALS — BP 135/87 | HR 89

## 2018-05-28 DIAGNOSIS — R2689 Other abnormalities of gait and mobility: Secondary | ICD-10-CM

## 2018-05-28 DIAGNOSIS — R42 Dizziness and giddiness: Secondary | ICD-10-CM

## 2018-05-28 NOTE — Therapy (Signed)
[facility] [address] [place], [place], [address] Phone: [phone]   Fax:  [phone]  Physical Therapy Treatment  Patient Details  Name: Anna Bernard MRN: [id number] Date of Birth: 1950/11/03 Referring Provider: Dr. Pamella Pert   Encounter Date: 05/28/2018  PT End of Session - 05/28/18 2124    Visit Number  12    Number of Visits  17    Date for PT Re-Evaluation  06/02/18    Authorization Type  UHC: limited to 60 visits between PT, OT, and Speech    PT Start Time  0850    PT Stop Time  0936    PT Time Calculation (min)  46 min       Past Medical History:  Diagnosis Date  . Anemia, unspecified   . Hyperlipidemia, unspecified   . Hypertension   . Hypothyroidism, unspecified   . Pre-diabetes   . Vitamin B12 deficiency     Past Surgical History:  Procedure Laterality Date  . ABDOMINAL HYSTERECTOMY    . GIVENS CAPSULE STUDY N/A 04/16/2018   Procedure: GIVENS CAPSULE STUDY;  Surgeon: Carol Ada, MD;  Location: [place];  Service: Endoscopy;  Laterality: N/A;  . SHOULDER SURGERY      Vitals:   05/28/18 0857  BP: 135/87  Pulse: 89    Subjective Assessment - 05/28/18 2115    Subjective  Pt states she is using the rollator in her house for assistance with ambulation - states she is able to walk fast with it;  pt states dizziness is about the same; states she had an episode of dizziness when she was lying on her bed that lasted about a minute and a half     Pertinent History  HTN, thyroid disease, DM (pre-diabetes now?), HLD    Patient Stated Goals  To get back to normal    Currently in Pain?  No/denies         Blood pressure readings;  Seated (at start of session) 135/87  HR 89 Supine   132/82     HR 86 Seated  100/70   HR 90    LIGHT-HEADEDNESS reported Standing 120/84  HR 86 (after 1") Standing (after 3")   124/73   HR 91               [facility] Adult PT Treatment/Exercise -  05/28/18 0903      Transfers   Transfers  Sit to Stand    Number of Reps  Other reps (comment)   5   Comments  No UE support used - pt transferred sit to stand from chair with c/o dizziness 2/10 on all 5 reps       Ambulation/Gait   Ambulation/Gait  Yes    Ambulation/Gait Assistance  6: Modified independent (Device/Increase time)    Assistive device  None    Gait velocity  8.44 secs = 3.89 ft/sec           Balance Exercises - 05/28/18 2123      Balance Exercises: Standing   Standing Eyes Opened  Narrow base of support (BOS);Head turns;Foam/compliant surface;5 reps    Standing Eyes Closed  Wide (BOA);Foam/compliant surface;Head turns;5 reps    Other Standing Exercises  Pt performed trunk rotations while standing on foam in corner - straight across 5 reps, then diagonals for 5 reps each with EO ; increased dizziness reported at end of this activity       Pt performed marching on foam  with EO and EC; added horizontal and vertical head turns 5 reps each with CGA to min assist for  Balance recovery     PT Short Term Goals - 05/07/18 9024      PT SHORT TERM GOAL #1   Title  Pt will be IND in HEP to improve deficits listed above. TARGET DATE FOR ALL STGS: 05/01/18    Baseline  Pt reports compliance with current exercises given to date- 05-07-18; standing on compliant surface with EO and EC added to HEP on 05-07-18    Status  On-going      PT SHORT TERM GOAL #2   Title  Perform BERG and write STG/LTGs as indicated.     Baseline  score 51/56 on 04-26-18    Status  Deferred      PT SHORT TERM GOAL #3   Title  Pt will improve gait speed with LRAD to >/=1.51f/sec to decr. falls risk.     Baseline  3.02/3.35 ft/sec without device on 05-07-18    Status  Achieved      PT SHORT TERM GOAL #4   Title  Pt will amb. 200' over even terrain with LRAD at MOD I level to improve functional mobility.     Baseline  Pt not amb. modified independently outside on even terrain as of 05-07-18    Status   On-going      PT SHORT TERM GOAL #5   Title  Finish vestibular exam and write goals indicated.     Status  Deferred        PT Long Term Goals - 05/28/18 0907      PT LONG TERM GOAL #1   Title  Pt will improve gait speed with LRAD to >/=2.658fsec. to safely amb. in the community. TARGET DATE FOR ALL LTGS: 05/29/18    Baseline  9.25 on 1st rep, 8.44 secs on 2nd rep -  3.89 ft/sec    Status  Achieved      PT LONG TERM GOAL #2   Title  Pt will amb. 400' over even/paved surfaces with LRAD at MOD I level to  improve functional mobility.     Status  New      PT LONG TERM GOAL #3   Title  Pt will perform STS txfs with </=2/10 dizziness and no LOB to safely perform txfs at home.     Baseline  Pt performed 5x sit to stand without UE support from mat with vertigo rating a 2/10   -    05-28-18    Status  Achieved            Plan - 05/28/18 2125    Clinical Impression Statement  Pt met LTG's #1 and 3:  #2 to be assessed next session.  Pt reported dizziness at 2/10 intensity with sit to stand transfers.  Pt had signficant drop in BP (orthostatic hypotension) with supine to sitting transitional movement.      Rehab Potential  Good    Clinical Impairments Affecting Rehab Potential  see above    PT Frequency  2x / week    PT Duration  8 weeks    PT Treatment/Interventions  ADLs/Self Care Home Management;Biofeedback;Canalith Repostioning;Electrical Stimulation;Therapeutic activities;Therapeutic exercise;Functional mobility training;Stair training;Gait training;DME Instruction;Balance training;Neuromuscular re-education;Patient/family education;Orthotic Fit/Training;Manual techniques;Vestibular    PT Next Visit Plan  Check LTG #2 - renew?? pt has met LTG #1 and 3:  cont balance and vestibular activities - cont to record BP readings  PT Home Exercise Plan  x1 viewing in standing; added standing balance HEP on 04-26-18; added marching on foam on 05-28-18    Consulted and Agree with Plan of Care   Patient       Patient will benefit from skilled therapeutic intervention in order to improve the following deficits and impairments:  Abnormal gait, Decreased endurance, Obesity, Decreased knowledge of use of DME, Decreased strength, Decreased balance, Decreased mobility, Dizziness, Impaired flexibility  Visit Diagnosis: Dizziness and giddiness  Other abnormalities of gait and mobility     Problem List Patient Active Problem List   Diagnosis Date Noted  . Vitamin B12 deficiency 02/06/2018  . Sepsis secondary to UTI ([place]) 01/29/2018  . Microcytic anemia 01/29/2018  . Renal insufficiency 01/29/2018  . Pure hypercholesterolemia 12/24/2014  . Diabetes mellitus type 2, controlled, without complications ([place]) 19/41/7408  . Bruit of right carotid artery 12/24/2014  . Thyroid disease 07/06/2014  . Severe obesity (BMI >= 40) ([place]) 07/06/2014  . HTN (hypertension) 07/06/2014    [place], [place], PT 05/28/2018, 9:30 PM  [facility] [address] [place], [place], [address] Phone: [phone]   Fax:  [phone]  Name: Anna Bernard MRN: [id number] Date of Birth: [birth year]

## 2018-05-28 NOTE — Patient Instructions (Addendum)
Marching in Place: Varied Surfaces    March in place, slowly lifting knees toward ceiling. Repeat __10__ times per session. Do _1__ sessions per day. Repeat __10__ times with eyes closed. Repeat on compliant surface: PILLOWS______.  HEAD TURNS EYES OPEN and CLOSED -- horizontal and vertical  - 8-10 reps each   PRactice turning side to side on pillow - trunk rotations - 8-10 times

## 2018-05-29 ENCOUNTER — Encounter: Payer: Self-pay | Admitting: Family Medicine

## 2018-05-29 ENCOUNTER — Ambulatory Visit: Payer: 59 | Admitting: Family Medicine

## 2018-05-29 VITALS — BP 117/75 | HR 90 | Temp 98.5°F | Resp 18 | Ht 61.0 in | Wt 204.0 lb

## 2018-05-29 DIAGNOSIS — Z23 Encounter for immunization: Secondary | ICD-10-CM | POA: Diagnosis not present

## 2018-05-29 DIAGNOSIS — D509 Iron deficiency anemia, unspecified: Secondary | ICD-10-CM | POA: Diagnosis not present

## 2018-05-29 DIAGNOSIS — I951 Orthostatic hypotension: Secondary | ICD-10-CM | POA: Diagnosis not present

## 2018-05-29 DIAGNOSIS — R42 Dizziness and giddiness: Secondary | ICD-10-CM | POA: Diagnosis not present

## 2018-05-29 DIAGNOSIS — E119 Type 2 diabetes mellitus without complications: Secondary | ICD-10-CM

## 2018-05-29 DIAGNOSIS — E538 Deficiency of other specified B group vitamins: Secondary | ICD-10-CM | POA: Diagnosis not present

## 2018-05-29 MED ORDER — OLMESARTAN MEDOXOMIL 20 MG PO TABS: 10.0000 mg | ORAL_TABLET | Freq: Every day | ORAL | 2 refills | Status: DC

## 2018-05-29 NOTE — Patient Instructions (Addendum)
Stop hydrochlorathizide 12.5mg  once a day Decrease olmesartan to 1/2 tablet once a day, now it will be 10mg  daily    If you have lab work done today you will be contacted with your lab results within the next 2 weeks.  If you have not heard from [place] then please contact us. The fastest way to get your results is to register for My Chart.   IF you received an x-ray today, you will receive an invoice from [facility]. Please contact [facility] at [phone] with questions or concerns regarding your invoice.   IF you received labwork today, you will receive an invoice from [facility]. Please contact [facility] at [phone] with questions or concerns regarding your invoice.   Our billing staff will not be able to assist you with questions regarding bills from these companies.  You will be contacted with the lab results as soon as they are available. The fastest way to get your results is to activate your My Chart account. Instructions are located on the last page of this paperwork. If you have not heard from [place] regarding the results in 2 weeks, please contact this office.

## 2018-05-29 NOTE — Progress Notes (Signed)
9/10/20199:35 AM  [name] 29-Sep-1950, 67 y.o. female [id number]  Chief Complaint  Patient presents with  . 6 month follow  . Dizziness    HPI:   Patient is a 67 y.o. female with past medical history significant for dizziness, HTN, HLP, prediabetes, microcystic anemia, vitamin B12 deficiency and hypothyroidism who presents today for routine followup  Overall walking slowly improving Can do short distance well but gets SOB when walking long distance Can walk forward well Cant walk backwards yet Trying to use walker less Still having orthostatic hypotension Working with PT regularly Initially started may 2019, after being hospitalized for sepsis 2/2 UTI No chest pain, palpitations, leg swelling, orthopnea nor PND Taking all meds as prescribed Home bp avg 104/62, hr 94 (from BP meter) Checks cbgs at home, reports fasting 90-110s  Fall Risk  04/17/2018 03/09/2018 03/02/2018 02/15/2018 02/06/2018  Falls in the past year? No No No Yes No     Depression screen [facility] 2/9 05/29/2018 05/29/2018 04/17/2018  Decreased Interest 0 0 0  Down, Depressed, Hopeless 0 0 0  PHQ - 2 Score 0 0 0    No Known Allergies  Prior to Admission medications   Medication Sig Start Date End Date Taking? Authorizing Provider  aspirin 81 MG tablet Take 81 mg by mouth daily.   Yes [provider]  [facility] B-12 500 MCG tablet TAKE 1 TABLET (500 MCG TOTAL) BY MOUTH DAILY. 04/30/18  Yes Myles Lipps, MD  ferrous gluconate (FERGON) 324 MG tablet Take 1 tablet (324 mg total) by mouth daily with breakfast. 02/06/18  Yes Myles Lipps, MD  hydrochlorothiazide (HYDRODIURIL) 12.5 MG tablet Take 1 tablet (12.5 mg total) by mouth daily. 04/18/18  Yes Myles Lipps, MD  levothyroxine (SYNTHROID) 100 MCG tablet Take 1 tablet (100 mcg total) by mouth daily before breakfast. 05/23/17  Yes Sagardia, Eilleen Kempf, MD  Multiple Vitamin (MULTIVITAMIN WITH MINERALS) TABS Take 1 tablet by mouth daily.   Yes [provider]  naproxen sodium (ANAPROX) 220 MG tablet Take 220 mg by mouth daily as needed (pain).    Yes [provider]  olmesartan (BENICAR) 20 MG tablet Take 1 tablet (20 mg total) by mouth daily. 04/18/18  Yes Myles Lipps, MD  polyethylene glycol powder (GLYCOLAX/MIRALAX) powder Take 17 g by mouth daily. 03/02/18  Yes Myles Lipps, MD  rosuvastatin (CRESTOR) 10 MG tablet Take 1 tablet (10 mg total) by mouth daily. 11/16/13  Yes Carmelina Dane, MD    Past Medical History:  Diagnosis Date  . Anemia, unspecified   . Hyperlipidemia, unspecified   . Hypertension   . Hypothyroidism, unspecified   . Pre-diabetes   . Vitamin B12 deficiency     Past Surgical History:  Procedure Laterality Date  . ABDOMINAL HYSTERECTOMY    . GIVENS CAPSULE STUDY N/A 04/16/2018   Procedure: GIVENS CAPSULE STUDY;  Surgeon: Jeani Hawking, MD;  Location: [facility] ENDOSCOPY;  Service: Endoscopy;  Laterality: N/A;  . SHOULDER SURGERY      Social History   Tobacco Use  . Smoking status: Never Smoker  . Smokeless tobacco: Never Used  Substance Use Topics  . Alcohol use: No    Alcohol/week: 0.0 standard drinks    History reviewed. No pertinent family history.  Review of Systems  Constitutional: Negative for chills, fever and malaise/fatigue.  Respiratory: Negative for cough and shortness of breath.   Cardiovascular: Negative for chest pain, palpitations and leg swelling.  Gastrointestinal: Negative  for abdominal pain, nausea and vomiting.  Musculoskeletal: Negative for falls.  Neurological: Positive for dizziness.   Per hpi  OBJECTIVE:  Blood pressure 117/75, pulse 90, temperature 98.5 F (36.9 C), temperature source Oral, resp. rate 18, height 5\' 1"  (1.549 m), weight 204 lb (92.5 kg), SpO2 98 %. Body mass index is 38.55 kg/m.   Wt Readings from Last 3 Encounters:  05/29/18 204 lb (92.5 kg)  04/17/18 204 lb 9.6 oz (92.8 kg)  04/16/18 203 lb (92.1 kg)    Orthostatic VS for  the past 24 hrs (Last 3 readings):  BP- Lying Pulse- Lying BP- Standing at 0 minutes Pulse- Standing at 0 minutes  05/29/18 0908 112/76 87 93/65 101    Physical Exam  Constitutional: She is oriented to person, place, and time. She appears well-developed and well-nourished.  HENT:  Head: Normocephalic and atraumatic.  Mouth/Throat: Oropharynx is clear and moist. No oropharyngeal exudate.  Eyes: Pupils are equal, round, and reactive to light. Conjunctivae and EOM are normal. No scleral icterus.  Neck: Neck supple.  Cardiovascular: Normal rate, regular rhythm and normal heart sounds. Exam reveals no gallop and no friction rub.  No murmur heard. Pulmonary/Chest: Effort normal and breath sounds normal. She has no wheezes. She has no rales.  Musculoskeletal: She exhibits no edema.  Neurological: She is alert and oriented to person, place, and time.  Skin: Skin is warm and dry.  Psychiatric: She has a normal mood and affect.  Nursing note and vitals reviewed.    ASSESSMENT and PLAN  1. Orthostatic hypotension We will cont to decrease medications. Push fluids. Precautions given. Cont working with PT for overall strength and balance. Still using walker, mostly outside the home. Not able to return to work at this time - Comprehensive metabolic panel  2. Dizziness - Comprehensive metabolic panel  3. Microcytic anemia - CBC - Iron, TIBC and Ferritin Panel  4. Vitamin B12 deficiency - Vitamin B12  5. Controlled type 2 diabetes mellitus without complication, without long-term current use of insulin (HCC) - Hemoglobin A1c  6. Need for vaccination - Flu Vaccine QUAD 36+ mos IM  Other orders - olmesartan (BENICAR) 20 MG tablet; Take 0.5 tablets (10 mg total) by mouth daily.  Return in about 2 weeks (around 06/12/2018).    Myles Lipps, MD Primary Care at [facility] [address] [place], [place] [address] Ph.  [phone] Fax [phone]

## 2018-05-30 LAB — CBC
Hematocrit: 34.9 % (ref 34.0–46.6)
Hemoglobin: 11.4 g/dL (ref 11.1–15.9)
MCH: 25.1 pg — ABNORMAL LOW (ref 26.6–33.0)
MCHC: 32.7 g/dL (ref 31.5–35.7)
MCV: 77 fL — ABNORMAL LOW (ref 79–97)
Platelets: 349 10*3/uL (ref 150–450)
RBC: 4.55 x10E6/uL (ref 3.77–5.28)
RDW: 15.7 % — ABNORMAL HIGH (ref 12.3–15.4)
WBC: 6 10*3/uL (ref 3.4–10.8)

## 2018-05-30 LAB — COMPREHENSIVE METABOLIC PANEL
ALT: 20 IU/L (ref 0–32)
AST: 20 IU/L (ref 0–40)
Albumin/Globulin Ratio: 1.4 (ref 1.2–2.2)
Albumin: 4.4 g/dL (ref 3.6–4.8)
Alkaline Phosphatase: 75 IU/L (ref 39–117)
BUN/Creatinine Ratio: 22 (ref 12–28)
BUN: 18 mg/dL (ref 8–27)
Bilirubin Total: 0.3 mg/dL (ref 0.0–1.2)
CO2: 26 mmol/L (ref 20–29)
Calcium: 9.8 mg/dL (ref 8.7–10.3)
Chloride: 101 mmol/L (ref 96–106)
Creatinine, Ser: 0.83 mg/dL (ref 0.57–1.00)
GFR calc Af Amer: 86 mL/min/{1.73_m2} (ref >59)
GFR calc non Af Amer: 74 mL/min/{1.73_m2} (ref >59)
Globulin, Total: 3.2 g/dL (ref 1.5–4.5)
Glucose: 94 mg/dL (ref 65–99)
Potassium: 3.9 mmol/L (ref 3.5–5.2)
Sodium: 143 mmol/L (ref 134–144)
Total Protein: 7.6 g/dL (ref 6.0–8.5)

## 2018-05-30 LAB — IRON,TIBC AND FERRITIN PANEL
Ferritin: 22 ng/mL (ref 15–150)
Iron Saturation: 34 % (ref 15–55)
Iron: 125 ug/dL (ref 27–139)
Total Iron Binding Capacity: 369 ug/dL (ref 250–450)
UIBC: 244 ug/dL (ref 118–369)

## 2018-05-30 LAB — VITAMIN B12: Vitamin B-12: 495 pg/mL (ref 232–1245)

## 2018-05-30 LAB — HEMOGLOBIN A1C
Est. average glucose Bld gHb Est-mCnc: 117 mg/dL
Hgb A1c MFr Bld: 5.7 % — ABNORMAL HIGH (ref 4.8–5.6)

## 2018-06-01 ENCOUNTER — Encounter: Payer: Self-pay | Admitting: Physical Therapy

## 2018-06-01 ENCOUNTER — Ambulatory Visit: Payer: 59 | Admitting: Physical Therapy

## 2018-06-01 DIAGNOSIS — R2689 Other abnormalities of gait and mobility: Secondary | ICD-10-CM | POA: Diagnosis not present

## 2018-06-01 DIAGNOSIS — R42 Dizziness and giddiness: Secondary | ICD-10-CM

## 2018-06-01 NOTE — Therapy (Addendum)
[facility] [address] [name], [place], [address] Phone: [phone]   Fax:  [phone]  Physical Therapy Treatment  Patient Details  Name: Anna Bernard MRN: [id number] Date of Birth: 03/22/1951 Referring Provider: Dr. Pamella Pert   Encounter Date: 06/01/2018  PT End of Session - 06/01/18 0851    Visit Number  13    Number of Visits  17    Date for PT Re-Evaluation  06/02/18    Authorization Type  [employer]: limited to 60 visits between PT, OT, and Speech    PT Start Time  0844    PT Stop Time  0927    PT Time Calculation (min)  43 min    Activity Tolerance  Patient tolerated treatment well    Behavior During Therapy  [facility] for tasks assessed/performed       Past Medical History:  Diagnosis Date  . Anemia, unspecified   . Hyperlipidemia, unspecified   . Hypertension   . Hypothyroidism, unspecified   . Pre-diabetes   . Vitamin B12 deficiency     Past Surgical History:  Procedure Laterality Date  . ABDOMINAL HYSTERECTOMY    . GIVENS CAPSULE STUDY N/A 04/16/2018   Procedure: GIVENS CAPSULE STUDY;  Surgeon: Carol Ada, MD;  Location: [facility];  Service: Endoscopy;  Laterality: N/A;  . SHOULDER SURGERY      There were no vitals filed for this visit.  Subjective Assessment - 06/01/18 0847    Subjective  gives a "thumbs up" when speaking of walking with rollator. no falls; dizziness is better "not as bad as it was" - home BP last night did not drop    Pertinent History  HTN, thyroid disease, DM (pre-diabetes now?), HLD    Patient Stated Goals  To get back to normal    Currently in Pain?  No/denies             Vestibular Assessment - 06/01/18 0001      Orthostatics   BP supine (x 5 minutes)  116/59    HR supine (x 5 minutes)  78    BP sitting  138/81    HR sitting  80    BP standing (after 1 minute)  130/74    HR standing (after 1 minute)  86    BP standing (after 3 minutes)  127/73    HR  standing (after 3 minutes)  86    Orthostatics Comment  very mild dizziness with sit to stand - quickly resolved               [facility] Adult PT Treatment/Exercise - 06/01/18 0001      Ambulation/Gait   Ambulation/Gait  Yes    Ambulation/Gait Assistance  6: Modified independent (Device/Increase time)    Ambulation Distance (Feet)  --   >500   Assistive device  None    Gait Pattern  Step-through pattern;Decreased stride length;Poor foot clearance - left;Poor foot clearance - right    Ambulation Surface  Level;Outdoor;Paved    Gait Comments  use of rollator on paved sidewalk - up/down inclines and curbs, increased gait speed to simulate crossing road.          Balance Exercises - 06/01/18 0933      Balance Exercises: Standing   Standing Eyes Opened  Narrow base of support (BOS);Foam/compliant surface;Head turns   horizontal and vertical head turns 2 x 30 sec each   Standing Eyes Closed  Narrow base of support (BOS);Foam/compliant surface;2 reps;30  secs    Gait with Head Turns  Forward   rollator   Tandem Gait  Forward;4 reps;Intermittent upper extremity support   length of // bars   Retro Gait  4 reps   length of // bars   Other Standing Exercises  fwd/bwd toe taps from [employer] x 10 each LE/direction - requires 1 UE support        PT Education - 06/01/18 1254    Education Details  discussion regarding discharge; continuing to assess BP as well as continued use of rollator and HEP    Person(s) Educated  Patient    Methods  Explanation    Comprehension  Verbalized understanding       PT Short Term Goals - 05/07/18 4496      PT SHORT TERM GOAL #1   Title  Pt will be IND in HEP to improve deficits listed above. TARGET DATE FOR ALL STGS: 05/01/18    Baseline  Pt reports compliance with current exercises given to date- 05-07-18; standing on compliant surface with EO and EC added to HEP on 05-07-18    Status  On-going      PT SHORT TERM GOAL #2   Title  Perform BERG and  write STG/LTGs as indicated.     Baseline  score 51/56 on 04-26-18    Status  Deferred      PT SHORT TERM GOAL #3   Title  Pt will improve gait speed with LRAD to >/=1.55f/sec to decr. falls risk.     Baseline  3.02/3.35 ft/sec without device on 05-07-18    Status  Achieved      PT SHORT TERM GOAL #4   Title  Pt will amb. 200' over even terrain with LRAD at MOD I level to improve functional mobility.     Baseline  Pt not amb. modified independently outside on even terrain as of 05-07-18    Status  On-going      PT SHORT TERM GOAL #5   Title  Finish vestibular exam and write goals indicated.     Status  Deferred        PT Long Term Goals - 06/01/18 1255      PT LONG TERM GOAL #1   Title  Pt will improve gait speed with LRAD to >/=2.631fsec. to safely amb. in the community. TARGET DATE FOR ALL LTGS: 05/29/18    Baseline  9.25 on 1st rep, 8.44 secs on 2nd rep -  3.89 ft/sec    Status  Achieved      PT LONG TERM GOAL #2   Title  Pt will amb. 400' over even/paved surfaces with LRAD at MOD I level to  improve functional mobility.     Baseline  use of rollator on paved sidewalk - up/down inclines and curbs as well as increased walking speed to simulate crossing road    Status  Achieved      PT LONG TERM GOAL #3   Title  Pt will perform STS txfs with </=2/10 dizziness and no LOB to safely perform txfs at home.     Baseline  Pt performed 5x sit to stand without UE support from mat with vertigo rating a 2/10   -    05-28-18    Status  Achieved            Plan - 06/01/18 1255    Clinical Impression Statement  Patient today with no drop in BP during orthostatic vitals. Patient also reporting home  BP readings have been consistent without drop in BP. Patient today meeting all LTG's. Evaluating PT speaking with patient with all parites agreeing upon discharge at this time. Education on continued safety awareness with rollator - patient able to demonstrate Mod I ambulation for prolonged  periods on paved surfaces today. Will plan for discharge on this date. Will gladly see patient in the future for any other needs.    Rehab Potential  Good    PT Frequency  2x / week    PT Duration  8 weeks    PT Treatment/Interventions  ADLs/Self Care Home Management;Biofeedback;Canalith Repostioning;Electrical Stimulation;Therapeutic activities;Therapeutic exercise;Functional mobility training;Stair training;Gait training;DME Instruction;Balance training;Neuromuscular re-education;Patient/family education;Orthotic Fit/Training;Manual techniques;Vestibular    Consulted and Agree with Plan of Care  Patient       Patient will benefit from skilled therapeutic intervention in order to improve the following deficits and impairments:  Abnormal gait, Decreased endurance, Obesity, Decreased knowledge of use of DME, Decreased strength, Decreased balance, Decreased mobility, Dizziness, Impaired flexibility  Visit Diagnosis: Dizziness and giddiness  Other abnormalities of gait and mobility     Problem List Patient Active Problem List   Diagnosis Date Noted  . Vitamin B12 deficiency 02/06/2018  . Sepsis secondary to UTI ([place]) 01/29/2018  . Microcytic anemia 01/29/2018  . Renal insufficiency 01/29/2018  . Pure hypercholesterolemia 12/24/2014  . Diabetes mellitus type 2, controlled, without complications ([place]) 62/94/7654  . Bruit of right carotid artery 12/24/2014  . Thyroid disease 07/06/2014  . Severe obesity (BMI >= 40) ([place]) 07/06/2014  . HTN (hypertension) 07/06/2014     Lanney Gins, PT, DPT 06/01/18 1:06 PM Pager: [phone]  [facility] [address] [place], [place], [address] Phone: [phone]   Fax:  [phone]  Name: ANBERLYN FEIMSTER MRN: [id number] Date of Birth: [birth year]  PHYSICAL THERAPY DISCHARGE SUMMARY  Visits from Start of Care: 13  Current functional level related to goals / functional  outcomes: Achieved all goals, BP stable, improved gait speed, balance, activity tolerance   Remaining deficits: All goals met   Education / Equipment: HEP  Plan: Patient agrees to discharge.  Patient goals were met. Patient is being discharged due to meeting the stated rehab goals.  ?????     Lanney Gins, PT, DPT Supplemental Physical Therapist 10/31/18 1:10 PM Pager: [phone] Office: [phone]

## 2018-06-04 ENCOUNTER — Ambulatory Visit: Payer: 59 | Admitting: Physical Therapy

## 2018-06-07 ENCOUNTER — Ambulatory Visit: Payer: 59 | Admitting: Physical Therapy

## 2018-06-08 ENCOUNTER — Other Ambulatory Visit: Payer: Self-pay | Admitting: Emergency Medicine

## 2018-06-08 DIAGNOSIS — E079 Disorder of thyroid, unspecified: Secondary | ICD-10-CM

## 2018-06-08 NOTE — Telephone Encounter (Signed)
levothyroxine refill Last Refill:05/23/17 # 90 3 RF Last OV: 05/23/17 has upcoming appt 06/14/18 PCP: Koren ShiverSantiago,irma Pharmacy:[facility] [address] Rx filled for 30 days

## 2018-06-14 ENCOUNTER — Other Ambulatory Visit: Payer: Self-pay

## 2018-06-14 ENCOUNTER — Encounter: Payer: Self-pay | Admitting: Family Medicine

## 2018-06-14 ENCOUNTER — Ambulatory Visit: Payer: 59 | Admitting: Family Medicine

## 2018-06-14 VITALS — BP 146/85 | HR 82 | Temp 98.6°F | Resp 16 | Ht 61.0 in | Wt 210.8 lb

## 2018-06-14 DIAGNOSIS — E119 Type 2 diabetes mellitus without complications: Secondary | ICD-10-CM

## 2018-06-14 DIAGNOSIS — D509 Iron deficiency anemia, unspecified: Secondary | ICD-10-CM

## 2018-06-14 DIAGNOSIS — E538 Deficiency of other specified B group vitamins: Secondary | ICD-10-CM

## 2018-06-14 DIAGNOSIS — I951 Orthostatic hypotension: Secondary | ICD-10-CM | POA: Diagnosis not present

## 2018-06-14 NOTE — Patient Instructions (Signed)
° ° ° °  If you have lab work done today you will be contacted with your lab results within the next 2 weeks.  If you have not heard from us then please contact us. The fastest way to get your results is to register for My Chart. ° ° °IF you received an x-ray today, you will receive an invoice from [facility]. Please contact [facility] at [phone] with questions or concerns regarding your invoice.  ° °IF you received labwork today, you will receive an invoice from [facility]. Please contact [facility] at [phone] with questions or concerns regarding your invoice.  ° °Our billing staff will not be able to assist you with questions regarding bills from these companies. ° °You will be contacted with the lab results as soon as they are available. The fastest way to get your results is to activate your My Chart account. Instructions are located on the last page of this paperwork. If you have not heard from us regarding the results in 2 weeks, please contact this office. °  ° ° ° °

## 2018-06-14 NOTE — Progress Notes (Signed)
9/26/201910:16 AM  Anna Bernard [birth year], 67 y.o. female [id number]  Chief Complaint  Patient presents with  . Hypertension    2 week f/u and return to work    HPI:   Patient is a 67 y.o. female with past medical history significant for dizziness,HTN, HLP,prediabetes,microcystic anemia,vitamin B12 deficiency andhypothyroidism who presents today for routine followup  Cont to see PT, last visit 06/01/18 Last visit no orthostatic but still having gait abornmalities Discharged from PT Uses rollator inside the house but not outside No falls Dizziness about 95% resolved  Checking BP st home, similar to here 120/80  Ready to be released back to work Has been driving this past week wo any issues or concerns   Lab Results  Component Value Date   HGBA1C 5.7 (H) 05/29/2018   HGBA1C 6.1 (H) 01/30/2018   Lab Results  Component Value Date   MICROALBUR 4.98 (H) 04/21/2013   LDLCALC 95 04/21/2013   CREATININE 0.83 05/29/2018   Lab Results  Component Value Date   WBC 6.0 05/29/2018   HGB 11.4 05/29/2018   HCT 34.9 05/29/2018   MCV 77 (L) 05/29/2018   PLT 349 05/29/2018   Lab Results  Component Value Date   VITAMINB12 495 05/29/2018    Fall Risk  06/14/2018 04/17/2018 03/09/2018 03/02/2018 02/15/2018  Falls in the past year? No No No No Yes     Depression screen [facility] 2/9 06/14/2018 05/29/2018 05/29/2018  Decreased Interest 0 0 0  Down, Depressed, Hopeless 0 0 0  PHQ - 2 Score 0 0 0    No Known Allergies  Prior to Admission medications   Medication Sig Start Date End Date Taking? Authorizing Provider  aspirin 81 MG tablet Take 81 mg by mouth daily.   Yes [provider]  [facility] B-12 500 MCG tablet TAKE 1 TABLET (500 MCG TOTAL) BY MOUTH DAILY. 04/30/18  Yes Myles Lipps, MD  ferrous gluconate (FERGON) 324 MG tablet Take 1 tablet (324 mg total) by mouth daily with breakfast. 02/06/18  Yes Myles Lipps, MD  levothyroxine (SYNTHROID, LEVOTHROID) 100 MCG  tablet TAKE 1 TABLET (100 MCG TOTAL) BY MOUTH DAILY BEFORE BREAKFAST. 06/08/18  Yes Myles Lipps, MD  Multiple Vitamin (MULTIVITAMIN WITH MINERALS) TABS Take 1 tablet by mouth daily.   Yes [provider]  naproxen sodium (ANAPROX) 220 MG tablet Take 220 mg by mouth daily as needed (pain).    Yes [provider]  olmesartan (BENICAR) 20 MG tablet Take 0.5 tablets (10 mg total) by mouth daily. 05/29/18  Yes Myles Lipps, MD  polyethylene glycol powder (GLYCOLAX/MIRALAX) powder Take 17 g by mouth daily. 03/02/18  Yes Myles Lipps, MD  rosuvastatin (CRESTOR) 10 MG tablet Take 1 tablet (10 mg total) by mouth daily. 11/16/13  Yes Carmelina Dane, MD    Past Medical History:  Diagnosis Date  . Anemia, unspecified   . Hyperlipidemia, unspecified   . Hypertension   . Hypothyroidism, unspecified   . Pre-diabetes   . Vitamin B12 deficiency     Past Surgical History:  Procedure Laterality Date  . ABDOMINAL HYSTERECTOMY    . GIVENS CAPSULE STUDY N/A 04/16/2018   Procedure: GIVENS CAPSULE STUDY;  Surgeon: Jeani Hawking, MD;  Location: [facility] ENDOSCOPY;  Service: Endoscopy;  Laterality: N/A;  . SHOULDER SURGERY      Social History   Tobacco Use  . Smoking status: Never Smoker  . Smokeless tobacco: Never Used  Substance Use Topics  .  Alcohol use: No    Alcohol/week: 0.0 standard drinks    No family history on file.  Review of Systems  Constitutional: Negative for chills and fever.  Respiratory: Negative for cough and shortness of breath.   Cardiovascular: Negative for chest pain, palpitations and leg swelling.  Gastrointestinal: Negative for abdominal pain, nausea and vomiting.     OBJECTIVE:  Blood pressure (!) 146/85, pulse 82, temperature 98.6 F (37 C), temperature source Oral, resp. rate 16, height 5\' 1"  (1.549 m), weight 210 lb 12.8 oz (95.6 kg), SpO2 96 %. Body mass index is 39.83 kg/m.   Orthostatic VS for the past 24 hrs (Last 3 readings):  BP-  Lying Pulse- Lying BP- Standing at 0 minutes Pulse- Standing at 0 minutes BP- Standing at 3 minutes Pulse- Standing at 3 minutes  06/14/18 0957 131/79 80 134/81 80 129/81 81    Physical Exam  Constitutional: She is oriented to person, place, and time. She appears well-developed and well-nourished.  HENT:  Head: Normocephalic and atraumatic.  Mouth/Throat: Oropharynx is clear and moist. No oropharyngeal exudate.  Eyes: Pupils are equal, round, and reactive to light. Conjunctivae and EOM are normal. No scleral icterus.  Neck: Neck supple.  Cardiovascular: Normal rate, regular rhythm and normal heart sounds. Exam reveals no gallop and no friction rub.  No murmur heard. Pulmonary/Chest: Effort normal and breath sounds normal. She has no wheezes. She has no rales.  Musculoskeletal: She exhibits no edema.  Neurological: She is alert and oriented to person, place, and time.  Skin: Skin is warm and dry.  Psychiatric: She has a normal mood and affect.  Nursing note and vitals reviewed.   ASSESSMENT and PLAN  1. Orthostatic hypotension Resolved, BP controlled on 10mg  benzapril. Cont current med. Cont rechecking BP at home. Released to return to work wo restrictions.  2. Controlled type 2 diabetes mellitus without complication, without long-term current use of insulin (HCC) Controlled. Continue current regime.   3. Microcytic anemia Resolved  4. Vitamin B12 deficiency Resolved  Return in about 4 weeks (around 07/12/2018) for eval regarding how return to work in going.    Myles Lipps, MD Primary Care at [facility] [address] [place], [place] [address] Ph.  [phone] Fax [phone]

## 2018-07-01 ENCOUNTER — Other Ambulatory Visit: Payer: Self-pay | Admitting: Family Medicine

## 2018-07-01 DIAGNOSIS — E079 Disorder of thyroid, unspecified: Secondary | ICD-10-CM

## 2018-07-06 LAB — HM MAMMOGRAPHY

## 2018-07-11 ENCOUNTER — Other Ambulatory Visit: Payer: Self-pay | Admitting: Family Medicine

## 2018-07-11 NOTE — Telephone Encounter (Signed)
Requested medication (s) are due for refill today: no  Requested medication (s) are on the active medication list: no  Last refill:  04/18/18  Future visit scheduled: yes  Notes to clinic: d/c'd 05/29/18    Requested Prescriptions  Pending Prescriptions Disp Refills   hydrochlorothiazide (HYDRODIURIL) 12.5 MG tablet [Pharmacy Med Name: HYDROCHLOROTHIAZIDE 12.5 MG TB] 90 tablet 0    Sig: TAKE 1 TABLET BY MOUTH EVERY DAY     Cardiovascular: Diuretics - Thiazide Failed - 07/11/2018  1:52 AM      Failed - Last BP in normal range    BP Readings from Last 1 Encounters:  06/14/18 (!) 146/85         Passed - Ca in normal range and within 360 days    Calcium  Date Value Ref Range Status  05/29/2018 9.8 8.7 - 10.3 mg/dL Final         Passed - Cr in normal range and within 360 days    Creat  Date Value Ref Range Status  04/21/2013 0.52 0.50 - 1.10 mg/dL Final   Creatinine, Ser  Date Value Ref Range Status  05/29/2018 0.83 0.57 - 1.00 mg/dL Final         Passed - K in normal range and within 360 days    Potassium  Date Value Ref Range Status  05/29/2018 3.9 3.5 - 5.2 mmol/L Final         Passed - Na in normal range and within 360 days    Sodium  Date Value Ref Range Status  05/29/2018 143 134 - 144 mmol/L Final         Passed - Valid encounter within last 6 months    Recent Outpatient Visits          3 weeks ago Orthostatic hypotension   [facility], Meda Coffee, MD   1 month ago Orthostatic hypotension   [facility], Meda Coffee, MD   2 months ago Dizziness   [facility], Meda Coffee, MD   3 months ago Dizziness   [facility], Meda Coffee, MD   4 months ago Orthostatic hypotension   [facility], Meda Coffee, MD      Future Appointments            In 1 week Myles Lipps, MD [facility] at [facility]

## 2018-07-20 ENCOUNTER — Ambulatory Visit: Payer: 59 | Admitting: Family Medicine

## 2018-08-10 ENCOUNTER — Telehealth: Payer: Self-pay

## 2018-08-10 NOTE — Telephone Encounter (Signed)
Copied from CRM [phone]#[id number]. Topic: General - Other >> Aug 10, 2018  2:31 PM Jaquita Rectoravis, Karen A wrote: Reason for CRM: Patient called to request a form for recertification of her handicapped placard for another 6 months please. Request a call back  Ph# [phone]  Left message on pt's vm. We have the form - need her to complete part of it. She has appt 12/6 - can get then.

## 2018-08-24 ENCOUNTER — Other Ambulatory Visit: Payer: Self-pay

## 2018-08-24 ENCOUNTER — Ambulatory Visit (INDEPENDENT_AMBULATORY_CARE_PROVIDER_SITE_OTHER): Payer: 59

## 2018-08-24 ENCOUNTER — Encounter: Payer: Self-pay | Admitting: Family Medicine

## 2018-08-24 ENCOUNTER — Ambulatory Visit: Payer: 59 | Admitting: Family Medicine

## 2018-08-24 VITALS — BP 144/82 | HR 82 | Ht 61.0 in | Wt 212.2 lb

## 2018-08-24 DIAGNOSIS — M25571 Pain in right ankle and joints of right foot: Secondary | ICD-10-CM

## 2018-08-24 DIAGNOSIS — E2839 Other primary ovarian failure: Secondary | ICD-10-CM | POA: Diagnosis not present

## 2018-08-24 DIAGNOSIS — E7800 Pure hypercholesterolemia, unspecified: Secondary | ICD-10-CM | POA: Diagnosis not present

## 2018-08-24 DIAGNOSIS — I10 Essential (primary) hypertension: Secondary | ICD-10-CM

## 2018-08-24 DIAGNOSIS — E039 Hypothyroidism, unspecified: Secondary | ICD-10-CM

## 2018-08-24 DIAGNOSIS — R300 Dysuria: Secondary | ICD-10-CM | POA: Diagnosis not present

## 2018-08-24 DIAGNOSIS — N390 Urinary tract infection, site not specified: Secondary | ICD-10-CM | POA: Diagnosis not present

## 2018-08-24 DIAGNOSIS — G8929 Other chronic pain: Secondary | ICD-10-CM

## 2018-08-24 DIAGNOSIS — E119 Type 2 diabetes mellitus without complications: Secondary | ICD-10-CM

## 2018-08-24 LAB — POC MICROSCOPIC URINALYSIS (UMFC): Mucus: ABSENT

## 2018-08-24 LAB — POCT URINALYSIS DIP (MANUAL ENTRY)
Bilirubin, UA: NEGATIVE
Glucose, UA: NEGATIVE mg/dL
Ketones, POC UA: NEGATIVE mg/dL
Nitrite, UA: POSITIVE — AB
Protein Ur, POC: 30 mg/dL — AB
Spec Grav, UA: 1.025 (ref 1.010–1.025)
Urobilinogen, UA: 0.2 E.U./dL
pH, UA: 6 (ref 5.0–8.0)

## 2018-08-24 MED ORDER — OLMESARTAN MEDOXOMIL 20 MG PO TABS: 20.0000 mg | ORAL_TABLET | Freq: Every day | ORAL | 1 refills | Status: DC

## 2018-08-24 MED ORDER — CEPHALEXIN 500 MG PO CAPS: 500.0000 mg | ORAL_CAPSULE | Freq: Two times a day (BID) | ORAL | 0 refills | Status: DC

## 2018-08-24 NOTE — Patient Instructions (Addendum)
Increase olmesartan (benicar) to 10mg  twice a day   If you have lab work done today you will be contacted with your lab results within the next 2 weeks.  If you have not heard from us then please contact us. The fastest way to get your results is to register for My Chart.   IF you received an x-ray today, you will receive an invoice from [facility]. Please contact [facility] at [phone] with questions or concerns regarding your invoice.   IF you received labwork today, you will receive an invoice from [facility]. Please contact [facility] at [phone] with questions or concerns regarding your invoice.   Our billing staff will not be able to assist you with questions regarding bills from these companies.  You will be contacted with the lab results as soon as they are available. The fastest way to get your results is to activate your My Chart account. Instructions are located on the last page of this paperwork. If you have not heard from us regarding the results in 2 weeks, please contact this office.

## 2018-08-24 NOTE — Progress Notes (Addendum)
12/6/20199:36 AM  Anna FiremanMary A Bernard 08/27/1951, 67 y.o. female [id number]  Chief Complaint  Patient presents with  . Follow-up    blood pressure seems to be up in the morning. Seems to settle as day progresses. Does not take daily anymore. Taking meds daily at he same time. No restrictions needed for work. Says work is going very well. No refills needed on meds    HPI:   Patient is a 67 y.o. female with past medical history significant for dizziness,HTN, HLP,diabetes,microcystic anemia,vitamin B12 deficiencyandhypothyroidism  who presents today for routine followup  Last OV in sept Returned to work after temp disability that started with hosp and resulted in debilitating dizziness, liable BP On olmesartan 10mg  once a day Reports morning BP elevated but normalizes in the afternoon  Back at work, doing well   Reports mammogram done by Gyn this oct Will request records Due for dexa Has been burning with urination, denies blood Has had recurrent UTI, one resulting in urosepsis Last UTI in oct, treated by gyn Patient reports that gyn did not mention prolapse, no atrophic vaginitis as possible contributors  Right ankle swelling, does not resolve with elevation, painful Chronic problem Has done PT She has not started using her brace again She had seen podiatry before for same issue Requesting disability placard Using aleve and topical for pain  Lab Results  Component Value Date   HGBA1C 5.7 (H) 05/29/2018   HGBA1C 6.1 (H) 01/30/2018   Lab Results  Component Value Date   MICROALBUR 4.98 (H) 04/21/2013   LDLCALC 95 04/21/2013   CREATININE 0.83 05/29/2018    Fall Risk  08/24/2018 06/14/2018 04/17/2018 03/09/2018 03/02/2018  Falls in the past year? 0 No No No No     [facility] 08/24/2018 06/14/2018 05/29/2018  Decreased Interest 0 0 0  Down, Depressed, Hopeless 0 0 0  PHQ - 2 Score 0 0 0    No Known Allergies  Prior to Admission medications     Medication Sig Start Date End Date Taking? Authorizing Provider  aspirin 81 MG tablet Take 81 mg by mouth daily.   Yes [provider]  [facility] B-12 500 MCG tablet TAKE 1 TABLET (500 MCG TOTAL) BY MOUTH DAILY. 04/30/18  Yes Myles LippsSantiago, Deleon Passe M, MD  ferrous gluconate (FERGON) 324 MG tablet Take 1 tablet (324 mg total) by mouth daily with breakfast. 02/06/18  Yes Myles LippsSantiago, Jasim Harari M, MD  levothyroxine (SYNTHROID, LEVOTHROID) 100 MCG tablet TAKE 1 TABLET (100 MCG TOTAL) BY MOUTH DAILY BEFORE BREAKFAST. 07/02/18  Yes Myles LippsSantiago, Eilyn Polack M, MD  Multiple Vitamin (MULTIVITAMIN WITH MINERALS) TABS Take 1 tablet by mouth daily.   Yes [provider]  naproxen sodium (ANAPROX) 220 MG tablet Take 220 mg by mouth daily as needed (pain).    Yes [provider]  olmesartan (BENICAR) 20 MG tablet Take 0.5 tablets (10 mg total) by mouth daily. 05/29/18  Yes Myles LippsSantiago, Kadeisha Betsch M, MD  polyethylene glycol powder (GLYCOLAX/MIRALAX) powder Take 17 g by mouth daily. 03/02/18  Yes Myles LippsSantiago, Lyn Deemer M, MD  rosuvastatin (CRESTOR) 10 MG tablet Take 1 tablet (10 mg total) by mouth daily. 11/16/13  Yes Carmelina DaneAnderson, Jeffery S, MD    Past Medical History:  Diagnosis Date  . Anemia, unspecified   . Hyperlipidemia, unspecified   . Hypertension   . Hypothyroidism, unspecified   . Pre-diabetes   . Vitamin B12 deficiency     Past Surgical History:  Procedure Laterality Date  . ABDOMINAL HYSTERECTOMY    .  GIVENS CAPSULE STUDY N/A 04/16/2018   Procedure: GIVENS CAPSULE STUDY;  Surgeon: Jeani Hawking, MD;  Location: [facility] ENDOSCOPY;  Service: Endoscopy;  Laterality: N/A;  . SHOULDER SURGERY      Social History   Tobacco Use  . Smoking status: Never Smoker  . Smokeless tobacco: Never Used  Substance Use Topics  . Alcohol use: No    Alcohol/week: 0.0 standard drinks    History reviewed. No pertinent family history.  Review of Systems  Constitutional: Negative for chills and fever.  Respiratory: Negative for cough and  shortness of breath.   Cardiovascular: Negative for chest pain, palpitations and leg swelling.  Gastrointestinal: Negative for abdominal pain, nausea and vomiting.  Genitourinary: Positive for dysuria and urgency. Negative for hematuria.  Musculoskeletal: Positive for joint pain.     OBJECTIVE:  Blood pressure (!) 144/82, pulse 82, height 5\' 1"  (1.549 m), weight 212 lb 3.2 oz (96.3 kg), SpO2 97 %. Body mass index is 40.09 kg/m.   Physical Exam  Constitutional: She is oriented to person, place, and time. She appears well-developed and well-nourished.  HENT:  Head: Normocephalic and atraumatic.  Mouth/Throat: Oropharynx is clear and moist. No oropharyngeal exudate.  Eyes: Pupils are equal, round, and reactive to light. Conjunctivae and EOM are normal. No scleral icterus.  Neck: Neck supple.  Cardiovascular: Normal rate, regular rhythm and normal heart sounds. Exam reveals no gallop and no friction rub.  No murmur heard. Pulmonary/Chest: Effort normal and breath sounds normal. She has no wheezes. She has no rales.  Musculoskeletal: She exhibits no edema.       Right ankle: She exhibits swelling (lateral anterior to malleolus). She exhibits normal range of motion, no ecchymosis and normal pulse.  Neurological: She is alert and oriented to person, place, and time. Gait (wide based, shuffled) abnormal.  Skin: Skin is warm and dry.  Psychiatric: She has a normal mood and affect.  Nursing note and vitals reviewed.    Diabetic Foot Exam - Simple   Simple Foot Form Visual Inspection No deformities, no ulcerations, no other skin breakdown bilaterally:  Yes Sensation Testing Intact to touch and monofilament testing bilaterally:  Yes Pulse Check Comments Felt all pricks with filament, no broken skin    Results for orders placed or performed in visit on 08/24/18 (from the past 24 hour(s))  POCT urinalysis dipstick     Status: Abnormal   Collection Time: 08/24/18 10:30 AM  Result Value  Ref Range   Color, UA yellow yellow   Clarity, UA cloudy (A) clear   Glucose, UA negative negative mg/dL   Bilirubin, UA negative negative   Ketones, POC UA negative negative mg/dL   Spec Grav, UA 1.610 9.604 - 1.025   Blood, UA trace-intact (A) negative   pH, UA 6.0 5.0 - 8.0   Protein Ur, POC =30 (A) negative mg/dL   Urobilinogen, UA 0.2 0.2 or 1.0 E.U./dL   Nitrite, UA Positive (A) Negative   Leukocytes, UA Small (1+) (A) Negative  POCT Microscopic Urinalysis ([facility])     Status: Abnormal   Collection Time: 08/24/18 10:48 AM  Result Value Ref Range   WBC,UR,HPF,POC Too numerous to count  (A) None WBC/hpf   RBC,UR,HPF,POC None None RBC/hpf   Bacteria Too numerous to count  None, Too numerous to count   Mucus Absent Absent   Epithelial Cells, UR Per Microscopy None None, Too numerous to count cells/hpf    Dg Ankle Complete Right  Result Date: 08/24/2018 CLINICAL DATA:  Chronic right ankle pain and swelling. EXAM: RIGHT ANKLE - COMPLETE 3+ VIEW COMPARISON:  None. FINDINGS: There is no evidence of fracture, dislocation, or joint effusion. There is no evidence of arthropathy or other focal bone abnormality. Soft tissues are unremarkable. IMPRESSION: Negative. Electronically Signed   By: Lupita Raider, M.D.   On: 08/24/2018 10:27     ASSESSMENT and PLAN  1. Controlled type 2 diabetes mellitus without complication, without long-term current use of insulin (HCC) Controlled. Continue current regime.  - HM DIABETES FOOT EXAM  2. Estrogen deficiency - DG Bone Density; Future  3. Essential hypertension Uncontrolled. Discussed increasing olmesartan to 10mg  BID, 20mg  daily caused orthostatic hypotension  4. Pure hypercholesterolemia Controlled. Continue current regime.   5. Hypothyroidism, unspecified type Checking labs today, medications will be adjusted as needed.  - TSH  6. Chronic pain of right ankle Apply a compressive ACE bandage. Rest and elevate the affected painful  area.  Apply cold compresses intermittently as needed. Completed form for disability parking placard - Ambulatory referral to Podiatry - DG Ankle Complete Right; Future  7. Dysuria - POCT urinalysis dipstick - POCT Microscopic Urinalysis ([facility])  8. Recurrent UTI UA + infection, treating with cephalexin. Referring to urology - POCT urinalysis dipstick - POCT Microscopic Urinalysis ([facility]) - Ambulatory referral to Urology  Other orders - olmesartan (BENICAR) 20 MG tablet; Take 1 tablet (20 mg total) by mouth daily. - cephALEXin (KEFLEX) 500 MG capsule; Take 1 capsule (500 mg total) by mouth 2 (two) times daily.    No follow-ups on file.    Myles Lipps, MD Primary Care at [facility] [address] [place], [place] [address] Ph.  [phone] Fax [phone]

## 2018-08-25 LAB — TSH: TSH: 2.18 u[IU]/mL (ref 0.450–4.500)

## 2018-09-04 ENCOUNTER — Ambulatory Visit: Payer: Self-pay

## 2018-09-04 ENCOUNTER — Encounter: Payer: Self-pay | Admitting: Sports Medicine

## 2018-09-04 ENCOUNTER — Ambulatory Visit: Payer: 59 | Admitting: Sports Medicine

## 2018-09-04 VITALS — BP 119/71 | HR 92

## 2018-09-04 DIAGNOSIS — M2142 Flat foot [pes planus] (acquired), left foot: Secondary | ICD-10-CM

## 2018-09-04 DIAGNOSIS — M2141 Flat foot [pes planus] (acquired), right foot: Secondary | ICD-10-CM | POA: Diagnosis not present

## 2018-09-04 DIAGNOSIS — M2170 Unequal limb length (acquired), unspecified site: Secondary | ICD-10-CM

## 2018-09-04 DIAGNOSIS — M76821 Posterior tibial tendinitis, right leg: Secondary | ICD-10-CM | POA: Diagnosis not present

## 2018-09-04 DIAGNOSIS — M7750 Other enthesopathy of unspecified foot: Secondary | ICD-10-CM

## 2018-09-04 DIAGNOSIS — M7751 Other enthesopathy of right foot: Secondary | ICD-10-CM | POA: Diagnosis not present

## 2018-09-04 MED ORDER — TRIAMCINOLONE ACETONIDE 10 MG/ML IJ SUSP
10.0000 mg | Freq: Once | INTRAMUSCULAR | Status: AC
  Administered 2018-09-04: 10 mg

## 2018-09-04 NOTE — Progress Notes (Signed)
Subjective: Anna Bernard is a 67 y.o. female patient who presents to office for evaluation of R>L foot pain. Patient complains of progressive pain especially over the last several months. Ranks pain 7/10, chey in nature and is now interferring with daily activities especially after work. Patient has tried anti-inflammatories, braces, and splints with no relief in symptoms on right. Patient states that her Left side is shorter and is interested in orthotics. Patient denies any other pedal complaints. Denies injury/trip/fall/sprain/any causative factors.   Review of Systems  Musculoskeletal: Positive for joint pain and myalgias.  All other systems reviewed and are negative.    Patient Active Problem List   Diagnosis Date Noted  . Vitamin B12 deficiency 02/06/2018  . Sepsis secondary to UTI (HCC) 01/29/2018  . Microcytic anemia 01/29/2018  . Renal insufficiency 01/29/2018  . Pure hypercholesterolemia 12/24/2014  . Diabetes mellitus type 2, controlled, without complications (HCC) 12/24/2014  . Bruit of right carotid artery 12/24/2014  . Thyroid disease 07/06/2014  . Severe obesity (BMI >= 40) (HCC) 07/06/2014  . HTN (hypertension) 07/06/2014    Current Outpatient Medications on File Prior to Visit  Medication Sig Dispense Refill  . aspirin 81 MG tablet Take 81 mg by mouth daily.    . cephALEXin (KEFLEX) 500 MG capsule Take 1 capsule (500 mg total) by mouth 2 (two) times daily. 14 capsule 0  . chlorhexidine (PERIDEX) 0.12 % solution RINSE WITH 1/2 OZ TWICE A DAY AFTER BRUSHING. DO NOT EAT,DRINK OR RINSE FOR 30 MINUTES AFTER  0  . [facility] B-12 500 MCG tablet TAKE 1 TABLET (500 MCG TOTAL) BY MOUTH DAILY. 30 tablet 2  . ferrous gluconate (FERGON) 324 MG tablet Take 1 tablet (324 mg total) by mouth daily with breakfast. 30 tablet 3  . levothyroxine (SYNTHROID, LEVOTHROID) 100 MCG tablet TAKE 1 TABLET (100 MCG TOTAL) BY MOUTH DAILY BEFORE BREAKFAST. 90 tablet 2  . Multiple Vitamin (MULTIVITAMIN  WITH MINERALS) TABS Take 1 tablet by mouth daily.    . naproxen sodium (ANAPROX) 220 MG tablet Take 220 mg by mouth daily as needed (pain).     Marland Kitchen olmesartan (BENICAR) 20 MG tablet Take 1 tablet (20 mg total) by mouth daily. 90 tablet 1  . polyethylene glycol powder (GLYCOLAX/MIRALAX) powder Take 17 g by mouth daily. 250 g 1  . rosuvastatin (CRESTOR) 10 MG tablet Take 1 tablet (10 mg total) by mouth daily. 90 tablet 1   No current facility-administered medications on file prior to visit.     No Known Allergies  Objective:  General: Alert and oriented x3 in no acute distress  Dermatology: No open lesions bilateral lower extremities, no webspace macerations, no ecchymosis bilateral, all nails x 10 are well manicured.  Vascular: Dorsalis Pedis and Posterior Tibial pedal pulses palpable, Capillary Fill Time 3 seconds,(+) pedal hair growth bilateral, no edema bilateral lower extremities, Temperature gradient within normal limits.  Neurology: Michaell Cowing sensation intact via light touch bilateral.  Musculoskeletal: Mild tenderness with palpation PT tendon course on Right, + Pes planus bilateral + Limb left difference with Left side being shorter. Strength within normal limits in all groups bilateral.   Gait: Antalgic gait   Assessment and Plan: Problem List Items Addressed This Visit    None    Visit Diagnoses    Capsulitis of ankle, right    -  Primary   Relevant Medications   triamcinolone acetonide (KENALOG) 10 MG/ML injection 10 mg (Completed)   Posterior tibial tendinitis of right lower extremity  Relevant Medications   triamcinolone acetonide (KENALOG) 10 MG/ML injection 10 mg (Completed)   Lower limb length difference       Pes planus of both feet           -Complete examination performed -Xrays reviewed -Discussed treatement options for tendonitis on right and short leg on left -After oral consent and aseptic prep, injected a mixture containing 1 ml of 2%  plain  lidocaine, 1 ml 0.5% plain marcaine, 0.5 ml of kenalog 10 and 0.5 ml of dexamethasone phosphate into Right PT tendon course without complication. Post-injection care discussed with patient.  -Rx Custom insoles; Patient saw Raiford NobleRick today for UCBL type with lift on left meanwhile may continue with brace, rest, ice, elevation, and good supportive shoes -Patient to return to office PUO or sooner if condition worsens.  Asencion Islamitorya Allexis Bordenave, DPM

## 2018-09-17 ENCOUNTER — Telehealth: Payer: Self-pay | Admitting: Family Medicine

## 2018-09-17 DIAGNOSIS — R300 Dysuria: Secondary | ICD-10-CM

## 2018-09-17 DIAGNOSIS — N390 Urinary tract infection, site not specified: Secondary | ICD-10-CM

## 2018-09-17 NOTE — Telephone Encounter (Unsigned)
Copied from CRM [id number]. Topic: General - Other >> Sep 17, 2018  3:54 PM Neomia Dearemaray, Efraim KaufmannMelissa wrote: Reason for CRM: Patient is asking if she can get a refill on her UTI medication but she does not have the bottle and does not know the name of the medication, please advise? Patient also wants to know if her husband can come get a paper script at office.  Best call back is [phone]

## 2018-09-18 ENCOUNTER — Other Ambulatory Visit: Payer: Self-pay

## 2018-09-18 DIAGNOSIS — N390 Urinary tract infection, site not specified: Secondary | ICD-10-CM

## 2018-09-18 MED ORDER — CEPHALEXIN 500 MG PO CAPS: 500.0000 mg | ORAL_CAPSULE | Freq: Two times a day (BID) | ORAL | 0 refills | Status: DC

## 2018-09-18 NOTE — Telephone Encounter (Signed)
Pt requesting refill of Keflex.  Reporting dark urine with odor and some burning.  Soonest urology can see her is in February.  Rx is pended.

## 2018-09-18 NOTE — Telephone Encounter (Signed)
Spoke with patient Her husband will pickup urine cup and she will collect sample at home Discussed care of sample. They will bring back on the jan 2nd. She can start abx after urine sample collected

## 2018-09-19 ENCOUNTER — Emergency Department (HOSPITAL_COMMUNITY)
Admission: EM | Admit: 2018-09-19 | Discharge: 2018-09-19 | Disposition: A | Discharge status: Home / Self Care | Payer: 59 | Attending: Emergency Medicine | Admitting: Emergency Medicine

## 2018-09-19 ENCOUNTER — Emergency Department (HOSPITAL_COMMUNITY): Payer: 59

## 2018-09-19 DIAGNOSIS — N10 Acute tubulo-interstitial nephritis: Secondary | ICD-10-CM | POA: Insufficient documentation

## 2018-09-19 DIAGNOSIS — M545 Low back pain: Secondary | ICD-10-CM | POA: Diagnosis not present

## 2018-09-19 DIAGNOSIS — N12 Tubulo-interstitial nephritis, not specified as acute or chronic: Secondary | ICD-10-CM

## 2018-09-19 DIAGNOSIS — E039 Hypothyroidism, unspecified: Secondary | ICD-10-CM | POA: Diagnosis not present

## 2018-09-19 DIAGNOSIS — I10 Essential (primary) hypertension: Secondary | ICD-10-CM | POA: Insufficient documentation

## 2018-09-19 DIAGNOSIS — Z79899 Other long term (current) drug therapy: Secondary | ICD-10-CM | POA: Insufficient documentation

## 2018-09-19 DIAGNOSIS — N200 Calculus of kidney: Secondary | ICD-10-CM | POA: Diagnosis not present

## 2018-09-19 DIAGNOSIS — R300 Dysuria: Secondary | ICD-10-CM | POA: Diagnosis present

## 2018-09-19 LAB — URINALYSIS, ROUTINE W REFLEX MICROSCOPIC
Glucose, UA: NEGATIVE mg/dL
Ketones, ur: NEGATIVE mg/dL
Nitrite: POSITIVE — AB
Protein, ur: 30 mg/dL — AB
Specific Gravity, Urine: 1.027 (ref 1.005–1.030)
pH: 5 (ref 5.0–8.0)

## 2018-09-19 LAB — BASIC METABOLIC PANEL
Anion gap: 7 (ref 5–15)
BUN: 18 mg/dL (ref 8–23)
CO2: 28 mmol/L (ref 22–32)
Calcium: 9.2 mg/dL (ref 8.9–10.3)
Chloride: 107 mmol/L (ref 98–111)
Creatinine, Ser: 0.83 mg/dL (ref 0.44–1.00)
GFR calc Af Amer: >60 mL/min (ref >60)
GFR calc non Af Amer: >60 mL/min (ref >60)
Glucose, Bld: 90 mg/dL (ref 70–99)
Potassium: 4 mmol/L (ref 3.5–5.1)
Sodium: 142 mmol/L (ref 135–145)

## 2018-09-19 LAB — CBC
HCT: 35.7 % — ABNORMAL LOW (ref 36.0–46.0)
Hemoglobin: 11.8 g/dL — ABNORMAL LOW (ref 12.0–15.0)
MCH: 25.3 pg — ABNORMAL LOW (ref 26.0–34.0)
MCHC: 33.1 g/dL (ref 30.0–36.0)
MCV: 76.4 fL — ABNORMAL LOW (ref 80.0–100.0)
Platelets: 301 10*3/uL (ref 150–400)
RBC: 4.67 MIL/uL (ref 3.87–5.11)
RDW: 13.8 % (ref 11.5–15.5)
WBC: 6.7 10*3/uL (ref 4.0–10.5)
nRBC: 0 % (ref 0.0–0.2)

## 2018-09-19 MED ORDER — SODIUM CHLORIDE 0.9 % IV BOLUS
1000.0000 mL | Freq: Once | INTRAVENOUS | Status: AC
  Administered 2018-09-19: 1000 mL via INTRAVENOUS

## 2018-09-19 MED ORDER — SODIUM CHLORIDE 0.9 % IV SOLN
1.0000 g | Freq: Once | INTRAVENOUS | Status: AC
  Administered 2018-09-19: 1 g via INTRAVENOUS
  Filled 2018-09-19: qty 10

## 2018-09-19 MED ORDER — CEPHALEXIN 500 MG PO CAPS: 500.0000 mg | ORAL_CAPSULE | Freq: Four times a day (QID) | ORAL | 0 refills | Status: AC

## 2018-09-19 MED ORDER — HYDROCODONE-ACETAMINOPHEN 5-325 MG PO TABS: 1.0000 | ORAL_TABLET | Freq: Four times a day (QID) | ORAL | 0 refills | Status: DC | PRN

## 2018-09-19 MED ORDER — KETOROLAC TROMETHAMINE 30 MG/ML IJ SOLN
15.0000 mg | Freq: Once | INTRAMUSCULAR | Status: AC
  Administered 2018-09-19: 15 mg via INTRAVENOUS
  Filled 2018-09-19: qty 1

## 2018-09-19 NOTE — Discharge Instructions (Addendum)
°  Pyelonephritis  There is evidence of an infection in the kidney.  Antibiotics: Please take all of your antibiotics until finished!   You may develop abdominal discomfort or diarrhea from the antibiotic.  You may help offset this with probiotics which you can buy or get in yogurt. Do not eat or take the probiotics until 2 hours after your antibiotic.   Hydration: Be sure to stay well-hydrated by drinking plenty of water.  Antiinflammatory medications: Take 400-600 mg of ibuprofen every 6 hours or 220-440 mg (over the counter dose) of naproxen every 12 hours for the next 3 days. After this time, these medications may be used as needed for pain. Take these medications with food to avoid upset stomach. Choose only one of these medications, do not take them together. Acetaminophen (generic for Tylenol): Should you continue to have additional pain while taking the ibuprofen or naproxen, you may add in acetaminophen as needed. Your daily total maximum amount of acetaminophen from all sources should be limited to 4000mg /day for persons without liver problems, or 2000mg /day for those with liver problems. Vicodin: May take Vicodin (hydrocodone-acetaminophen) as needed for severe pain.  Do not drive or perform other dangerous activities while taking the Vicodin.  Please note that each pill of Vicodin contains 325 mg of acetaminophen (Tylenol) and the above dosage limits apply.  Please follow-up with a primary care provider for any further management of this issue.  Recommend follow-up this week or early next week to ensure improving symptoms.  Return to the ED for increased pain, difficulty urinating, vomiting, fever, or any other major concerns.

## 2018-09-19 NOTE — ED Triage Notes (Signed)
Pt reports painful urination, lower back pain and dark urine x3-4 days, significant hx of urinary tract infections. Denies fever or chills.

## 2018-09-19 NOTE — ED Notes (Signed)
ED Provider at bedside. 

## 2018-09-19 NOTE — ED Provider Notes (Signed)
[facility] EMERGENCY DEPARTMENT Provider Note   CSN: [id number] Arrival date & time: 09/19/18  1334     History   Chief Complaint Chief Complaint  Patient presents with  . Urinary Tract Infection    HPI Anna Bernard is a 68 y.o. female.  HPI   Anna Bernard is a 68 y.o. female, with a history of anemia, hyperlipidemia, HTN, and hypothyroidism, presenting to the ED with lower back pain for the past 2-3 days. Pain began when she bent over to try to stand up to get off the toilet.   States she feels like she may have an UTI because her urine has gotten darker with abnormal odor and dysuria.   States she thinks her back pain feels more muscular.  Denies fever/chills, abdominal pain, hematuria, N/V/D, numbness, weakness, falls/trauma, or any other complaints.    Past Medical History:  Diagnosis Date  . Anemia, unspecified   . Hyperlipidemia, unspecified   . Hypertension   . Hypothyroidism, unspecified   . Pre-diabetes   . Vitamin B12 deficiency     Patient Active Problem List   Diagnosis Date Noted  . Vitamin B12 deficiency 02/06/2018  . Sepsis secondary to UTI (HCC) 01/29/2018  . Microcytic anemia 01/29/2018  . Renal insufficiency 01/29/2018  . Pure hypercholesterolemia 12/24/2014  . Diabetes mellitus type 2, controlled, without complications (HCC) 12/24/2014  . Bruit of right carotid artery 12/24/2014  . Thyroid disease 07/06/2014  . Severe obesity (BMI >= 40) (HCC) 07/06/2014  . HTN (hypertension) 07/06/2014    Past Surgical History:  Procedure Laterality Date  . ABDOMINAL HYSTERECTOMY    . GIVENS CAPSULE STUDY N/A 04/16/2018   Procedure: GIVENS CAPSULE STUDY;  Surgeon: Jeani Hawking, MD;  Location: [facility] ENDOSCOPY;  Service: Endoscopy;  Laterality: N/A;  . SHOULDER SURGERY       OB History   No obstetric history on file.      Home Medications    Prior to Admission medications   Medication Sig Start Date End Date Taking?  Authorizing Provider  aspirin 81 MG tablet Take 81 mg by mouth daily.    [provider]  cephALEXin (KEFLEX) 500 MG capsule Take 1 capsule (500 mg total) by mouth 4 (four) times daily for 10 days. 09/19/18 09/29/18  Joy, Shawn C, PA-C  chlorhexidine (PERIDEX) 0.12 % solution RINSE WITH 1/2 OZ TWICE A DAY AFTER BRUSHING. DO NOT EAT,DRINK OR RINSE FOR 30 MINUTES AFTER 05/30/18   [provider]  [facility] B-12 500 MCG tablet TAKE 1 TABLET (500 MCG TOTAL) BY MOUTH DAILY. 04/30/18   Myles Lipps, MD  ferrous gluconate (FERGON) 324 MG tablet Take 1 tablet (324 mg total) by mouth daily with breakfast. 02/06/18   Myles Lipps, MD  HYDROcodone-acetaminophen (NORCO/VICODIN) 5-325 MG tablet Take 1 tablet by mouth every 6 (six) hours as needed for severe pain. 09/19/18   Joy, Shawn C, PA-C  levothyroxine (SYNTHROID, LEVOTHROID) 100 MCG tablet TAKE 1 TABLET (100 MCG TOTAL) BY MOUTH DAILY BEFORE BREAKFAST. 07/02/18   Myles Lipps, MD  Multiple Vitamin (MULTIVITAMIN WITH MINERALS) TABS Take 1 tablet by mouth daily.    [provider]  naproxen sodium (ANAPROX) 220 MG tablet Take 220 mg by mouth daily as needed (pain).     [provider]  olmesartan (BENICAR) 20 MG tablet Take 1 tablet (20 mg total) by mouth daily. 08/24/18   Myles Lipps, MD  polyethylene glycol powder (GLYCOLAX/MIRALAX) powder Take 17  g by mouth daily. 03/02/18   Myles LippsSantiago, Irma M, MD  rosuvastatin (CRESTOR) 10 MG tablet Take 1 tablet (10 mg total) by mouth daily. 11/16/13   Carmelina DaneAnderson, Jeffery S, MD    Family History No family history on file.  Social History Social History   Tobacco Use  . Smoking status: Never Smoker  . Smokeless tobacco: Never Used  Substance Use Topics  . Alcohol use: No    Alcohol/week: 0.0 standard drinks  . Drug use: No     Allergies   Patient has no known allergies.   Review of Systems Review of Systems  Constitutional: Negative for chills, diaphoresis and fever.    Respiratory: Negative for shortness of breath.   Cardiovascular: Negative for chest pain.  Gastrointestinal: Negative for abdominal pain, blood in stool, diarrhea, nausea and vomiting.  Genitourinary: Positive for dysuria.  Musculoskeletal: Positive for back pain.  Neurological: Negative for syncope, weakness and numbness.  All other systems reviewed and are negative.    Physical Exam Updated Vital Signs BP 121/69   Pulse 70   Temp (!) 97.3 F (36.3 C) (Oral)   Resp 18   SpO2 100%   Physical Exam Vitals signs and nursing note reviewed.  Constitutional:      General: She is not in acute distress.    Appearance: She is well-developed. She is not diaphoretic.  HENT:     Head: Normocephalic and atraumatic.  Eyes:     Conjunctiva/sclera: Conjunctivae normal.  Neck:     Musculoskeletal: Neck supple.  Cardiovascular:     Rate and Rhythm: Normal rate and regular rhythm.     Heart sounds: Normal heart sounds.  Pulmonary:     Effort: Pulmonary effort is normal. No respiratory distress.     Breath sounds: Normal breath sounds.  Abdominal:     Palpations: Abdomen is soft.     Tenderness: There is no abdominal tenderness. There is no guarding.  Musculoskeletal:       Back:  Lymphadenopathy:     Cervical: No cervical adenopathy.  Skin:    General: Skin is warm and dry.  Neurological:     Mental Status: She is alert.     Comments: Sensation grossly intact to light touch in the lower extremities bilaterally. No saddle anesthesias. Strength 5/5 in the bilateral lower extremities.  No noted gait deficit. Coordination intact with heel to shin testing.  Psychiatric:        Behavior: Behavior normal.      ED Treatments / Results  Labs (all labs ordered are listed, but only abnormal results are displayed) Labs Reviewed  URINALYSIS, ROUTINE W REFLEX MICROSCOPIC - Abnormal; Notable for the following components:      Result Value   Color, Urine AMBER (*)    APPearance CLOUDY  (*)    Hgb urine dipstick SMALL (*)    Bilirubin Urine SMALL (*)    Protein, ur 30 (*)    Nitrite POSITIVE (*)    Leukocytes, UA SMALL (*)    Bacteria, UA RARE (*)    All other components within normal limits  CBC - Abnormal; Notable for the following components:   Hemoglobin 11.8 (*)    HCT 35.7 (*)    MCV 76.4 (*)    MCH 25.3 (*)    All other components within normal limits  URINE CULTURE  BASIC METABOLIC PANEL    EKG None  Radiology Ct Renal Stone Study  Result Date: 09/19/2018 CLINICAL DATA:  Low back  pain and painful urination for 4 days. EXAM: CT ABDOMEN AND PELVIS WITHOUT CONTRAST TECHNIQUE: Multidetector CT imaging of the abdomen and pelvis was performed following the standard protocol without IV contrast. COMPARISON:  None. FINDINGS: Lower chest: No acute abnormalities identified in the visualized lung bases. Hepatobiliary: No focal liver abnormality is seen. No gallstones, gallbladder wall thickening, or biliary dilatation. Pancreas: Unremarkable. No pancreatic ductal dilatation or surrounding inflammatory changes. Spleen: Normal in size without focal abnormality. Adrenals/Urinary Tract: The adrenal glands are normal. There is a 3 mm stone in the left renal pelvis. There is no hydronephrosis bilaterally. No focal renal lesion is noted. The bladder is unremarkable. Stomach/Bowel: There is a small hiatal hernia. The stomach is otherwise normal. There is no small bowel obstruction. There is diverticulosis of colon without diverticulitis. The appendix is not definitely seen but no inflammation is noted around cecum. Moderate bowel content is identified throughout colon. Vascular/Lymphatic: Aortic atherosclerosis. No enlarged abdominal or pelvic lymph nodes. Reproductive: Status post hysterectomy. No adnexal masses. Other: None. Musculoskeletal: Degenerative joint changes of the spine are noted. IMPRESSION: 3 mm stone identified in the left renal pelvis. There is no evidence of  obstructive changes in bilateral collecting system. The kidneys are otherwise normal. Electronically Signed   By: Sherian Rein M.D.   On: 09/19/2018 18:14    Procedures Procedures (including critical care time)  Medications Ordered in ED Medications  sodium chloride 0.9 % bolus 1,000 mL (0 mLs Intravenous Stopped 09/19/18 1914)  cefTRIAXone (ROCEPHIN) 1 g in sodium chloride 0.9 % 100 mL IVPB (0 g Intravenous Stopped 09/19/18 1914)  ketorolac (TORADOL) 30 MG/ML injection 15 mg (15 mg Intravenous Given 09/19/18 1746)     Initial Impression / Assessment and Plan / ED Course  I have reviewed the triage vital signs and the nursing notes.  Pertinent labs & imaging results that were available during my care of the patient were reviewed by me and considered in my medical decision making (see chart for details).     Patient presents with chief complaint of left lower back pain as well as dysuria. Patient is nontoxic appearing, afebrile, not tachycardic, not tachypneic, not hypotensive, maintains excellent SPO2 on room air.   The presentation of her back pain seems to favor a musculoskeletal source, however, it would be rather coincidental for her to have MSK back pain at the same time as a UTI. CT renal stone study shows renal stone at the left renal pelvis without hydronephrosis.  Pain controlled here in the ED.  Ambulated without assistance or noted difficulty. The patient was given instructions for home care as well as return precautions. Patient voices understanding of these instructions, accepts the plan, and is comfortable with discharge.  Findings and plan of care discussed with Chaney Malling, MD. Dr. Silverio Lay personally evaluated and examined this patient.   Vitals:   09/19/18 1800 09/19/18 1845 09/19/18 1900 09/19/18 1930  BP: (!) 144/72 (!) 149/70  (!) 170/80  Pulse: 60 64  62  Resp:   16   Temp:      TempSrc:      SpO2: 100% 98%  100%     Final Clinical Impressions(s) / ED Diagnoses    Final diagnoses:  Pyelonephritis    ED Discharge Orders         Ordered    cephALEXin (KEFLEX) 500 MG capsule  4 times daily     09/19/18 1933    HYDROcodone-acetaminophen (NORCO/VICODIN) 5-325 MG tablet  Every 6 hours  PRN     09/19/18 1933           Concepcion LivingJoy, Shawn C, PA-C 09/20/18 0033    Charlynne PanderYao, David Hsienta, MD 09/22/18 [phone] [id number]

## 2018-09-20 ENCOUNTER — Telehealth: Payer: Self-pay | Admitting: Family Medicine

## 2018-09-20 ENCOUNTER — Ambulatory Visit: Payer: Self-pay

## 2018-09-20 NOTE — Telephone Encounter (Signed)
Patient called in with the information below. I called the patient and she says she is needing a medication for the UTI she has all the time. She says she went to the hospital yesterday. I advised that Dr. Leretha Pol sent in a prescription for Keflex on [facility]18[facility] to [facility] [address]. She says "I didn't know that, that's why I went to the hospital, but my insurance wouldn't pay for it." I called [facility] and spoke to [place], [facility], who says the Keflex prescription sent on 09/19/18 was picked up today and the cost to the patient was $2 and it went through the patient's insurance, 40 capsules. I advised the patient what Esmeralda Arthur said, the patient says her husband picked it [facility] from [facility]. I asked her to verify on the bottle what was picked up, she verified it is Keflex 500 mg 1 tab by mouth QID x 10 days. I advised that is the prescription from the hospital and the patient should take that as prescribed, if any symptoms persist during taking the medication or reoccur after, to call the office back to set up an appointment with Dr. Leretha Pol, she verbalized understanding.   Message from [facility] sent at 09/20/2018 10:14 AM EST   Pt stated that she went to the hospital and she was prescribed cephALEXin (KEFLEX) 500 MG capsule. Pt stated when she went to pick up the prescription she was told by the pharmacy that her insurance would not pay for the medication because she already was prescribed a medication. Pt stated she is still experiencing the symptoms of the uti and would like a nurse to return her call to discuss getting the prescription. Cb# [phone]     Reason for Disposition . General information question, no triage required and triager able to answer question  Protocols used: INFORMATION ONLY CALL-A-AH

## 2018-09-20 NOTE — Telephone Encounter (Signed)
Please advise 

## 2018-09-20 NOTE — Telephone Encounter (Signed)
I reviewed ER records. Yes I still want for her to see urologist. Thanks

## 2018-09-20 NOTE — Telephone Encounter (Signed)
Copied from CRM [id number]. Topic: General - Inquiry >> Sep 20, 2018 12:51 PM Crist Infante wrote: Reason for CRM: pt went to ED yesterday, given 10 day supply of abx.  Pt wants to know does she still have to go to the urologist?  Pt states the did lots of test and a scan as well.

## 2018-09-21 LAB — URINE CULTURE: Culture: >=100000 — AB

## 2018-09-21 NOTE — Telephone Encounter (Signed)
Pt advised. She is okay with this.

## 2018-09-22 ENCOUNTER — Other Ambulatory Visit: Payer: Self-pay

## 2018-09-22 ENCOUNTER — Encounter (HOSPITAL_COMMUNITY): Payer: Self-pay

## 2018-09-22 ENCOUNTER — Emergency Department (HOSPITAL_COMMUNITY)
Admission: EM | Admit: 2018-09-22 | Discharge: 2018-09-22 | Disposition: A | Discharge status: Home / Self Care | Payer: 59 | Attending: Emergency Medicine | Admitting: Emergency Medicine

## 2018-09-22 ENCOUNTER — Telehealth: Payer: Self-pay

## 2018-09-22 DIAGNOSIS — E039 Hypothyroidism, unspecified: Secondary | ICD-10-CM | POA: Diagnosis not present

## 2018-09-22 DIAGNOSIS — I10 Essential (primary) hypertension: Secondary | ICD-10-CM | POA: Insufficient documentation

## 2018-09-22 DIAGNOSIS — Z79899 Other long term (current) drug therapy: Secondary | ICD-10-CM | POA: Diagnosis not present

## 2018-09-22 DIAGNOSIS — M545 Low back pain: Secondary | ICD-10-CM | POA: Diagnosis present

## 2018-09-22 DIAGNOSIS — E119 Type 2 diabetes mellitus without complications: Secondary | ICD-10-CM | POA: Insufficient documentation

## 2018-09-22 DIAGNOSIS — Z7982 Long term (current) use of aspirin: Secondary | ICD-10-CM | POA: Insufficient documentation

## 2018-09-22 DIAGNOSIS — N390 Urinary tract infection, site not specified: Secondary | ICD-10-CM | POA: Insufficient documentation

## 2018-09-22 LAB — BASIC METABOLIC PANEL
Anion gap: 7 (ref 5–15)
BUN: 17 mg/dL (ref 8–23)
CO2: 25 mmol/L (ref 22–32)
Calcium: 9.1 mg/dL (ref 8.9–10.3)
Chloride: 106 mmol/L (ref 98–111)
Creatinine, Ser: 0.83 mg/dL (ref 0.44–1.00)
GFR calc Af Amer: >60 mL/min (ref >60)
GFR calc non Af Amer: >60 mL/min (ref >60)
Glucose, Bld: 100 mg/dL — ABNORMAL HIGH (ref 70–99)
Potassium: 3.9 mmol/L (ref 3.5–5.1)
Sodium: 138 mmol/L (ref 135–145)

## 2018-09-22 LAB — CBC
HCT: 34.6 % — ABNORMAL LOW (ref 36.0–46.0)
Hemoglobin: 11.4 g/dL — ABNORMAL LOW (ref 12.0–15.0)
MCH: 25.4 pg — ABNORMAL LOW (ref 26.0–34.0)
MCHC: 32.9 g/dL (ref 30.0–36.0)
MCV: 77.2 fL — ABNORMAL LOW (ref 80.0–100.0)
Platelets: 282 10*3/uL (ref 150–400)
RBC: 4.48 MIL/uL (ref 3.87–5.11)
RDW: 13.6 % (ref 11.5–15.5)
WBC: 6.3 10*3/uL (ref 4.0–10.5)
nRBC: 0 % (ref 0.0–0.2)

## 2018-09-22 MED ORDER — AMOXICILLIN-POT CLAVULANATE 875-125 MG PO TABS
1.0000 | ORAL_TABLET | Freq: Once | ORAL | Status: AC
  Administered 2018-09-22: 1 via ORAL
  Filled 2018-09-22: qty 1

## 2018-09-22 MED ORDER — AMOXICILLIN-POT CLAVULANATE 875-125 MG PO TABS: 1.0000 | ORAL_TABLET | Freq: Two times a day (BID) | ORAL | 0 refills | Status: DC

## 2018-09-22 NOTE — Discharge Instructions (Addendum)
You can take ibuprofen, 600mg  every 8 hours as needed for pain.

## 2018-09-22 NOTE — Telephone Encounter (Signed)
Pt called at home. Aviva Kluver [facility] requested that pt return for F/U to ED Re: UC done in ED on 09/19/2018 Pt not feeling better. Has an appt at Urologist but not for a month.  She will come to ED this morning.

## 2018-09-22 NOTE — ED Triage Notes (Signed)
Pt called back to ED for u/c report.  Resistant to antibiotic prescribed.  VO obtained from Dr. Madilyn Hook for lab work, no u/a.   Pt states left back pain, foul urine odor has only improved slightly.  Pt took pain pill prior to coming to ED

## 2018-09-22 NOTE — Progress Notes (Signed)
ED Antimicrobial Stewardship Positive Culture Follow Up   Anna Bernard is an 68 y.o. female who presented to [facility] on 09/19/2018 with a chief complaint of lower back pain found to have pyelonephritis  Chief Complaint  Patient presents with  . Urinary Tract Infection    Recent Results (from the past 720 hour(s))  Urine C&S     Status: Abnormal   Collection Time: 09/19/18  2:15 PM  Result Value Ref Range Status   Specimen Description URINE, RANDOM  Final   Special Requests   Final    NONE Performed at [facility], [address] [place], [place] [address]    Culture (A)  Final    >=100,000 COLONIES/mL ESCHERICHIA COLI Confirmed Extended Spectrum Beta-Lactamase Producer (ESBL).  In bloodstream infections from ESBL organisms, carbapenems are preferred over piperacillin/tazobactam. They are shown to have a lower risk of mortality.    Report Status 09/21/2018 FINAL  Final   Organism ID, Bacteria ESCHERICHIA COLI (A)  Final      Susceptibility   Escherichia coli - MIC*    AMPICILLIN >=32 RESISTANT Resistant     CEFAZOLIN >=64 RESISTANT Resistant     CEFTRIAXONE >=64 RESISTANT Resistant     CIPROFLOXACIN >=4 RESISTANT Resistant     GENTAMICIN >=16 RESISTANT Resistant     IMIPENEM <=0.25 SENSITIVE Sensitive     NITROFURANTOIN <=16 SENSITIVE Sensitive     TRIMETH/SULFA >=320 RESISTANT Resistant     AMPICILLIN/SULBACTAM 4 SENSITIVE Sensitive     PIP/TAZO <=4 SENSITIVE Sensitive     Extended ESBL POSITIVE Resistant     * >=100,000 COLONIES/mL ESCHERICHIA COLI    [x]  Treated with cephalexin, organism resistant to prescribed antimicrobial  Will ask patient placement to call patient and ask to return to the ED for re-evaluation given ESBL E.coli. If patient refuses to return, will prescribe the following antibiotic   New antibiotic prescription: Augmentin 875 mg twice daily x 10 days   ED Provider: Aviva Kluver, PA-C    [name], PharmD., BCPS Clinical  Pharmacist Clinical phone for 09/22/18 until 3:30pm: [phone] If after 3:30pm, please refer to [facility] for unit-specific pharmacist

## 2018-09-22 NOTE — ED Provider Notes (Signed)
[facility] EMERGENCY DEPARTMENT Provider Note   CSN: [id number] Arrival date & time: 09/22/18  1324     History   Chief Complaint No chief complaint on file.   HPI Anna Bernard is a 68 y.o. female.  The history is provided by the patient and medical records. No language interpreter was used.   Anna Bernard is a 68 y.o. female who presents to the Emergency Department complaining of medication change. He presents to the emergency department for evaluation after getting called back for a medication change. She reports six days of left sided low back pain that is worse with movement as well as foul-smelling urine. She states that pain and symptoms may have been present a little longer but she is unsure. She was evaluated in the emergency department with CT scan, labs and treated for a urinary tract infection with Keflex four times daily. She was called to return to the emergency department due to cultures going out ESBL. Overall she states that she does have ongoing left sided back pain with movement. She states that her urine is still foul-smelling but it is slightly improved compared to previous. She denies any fevers, malaise, nausea, vomiting, dysuria. Past Medical History:  Diagnosis Date  . Anemia, unspecified   . Hyperlipidemia, unspecified   . Hypertension   . Hypothyroidism, unspecified   . Pre-diabetes   . Vitamin B12 deficiency     Patient Active Problem List   Diagnosis Date Noted  . Vitamin B12 deficiency 02/06/2018  . Sepsis secondary to UTI (HCC) 01/29/2018  . Microcytic anemia 01/29/2018  . Renal insufficiency 01/29/2018  . Pure hypercholesterolemia 12/24/2014  . Diabetes mellitus type 2, controlled, without complications (HCC) 12/24/2014  . Bruit of right carotid artery 12/24/2014  . Thyroid disease 07/06/2014  . Severe obesity (BMI >= 40) (HCC) 07/06/2014  . HTN (hypertension) 07/06/2014    Past Surgical History:  Procedure Laterality  Date  . ABDOMINAL HYSTERECTOMY    . GIVENS CAPSULE STUDY N/A 04/16/2018   Procedure: GIVENS CAPSULE STUDY;  Surgeon: Jeani Hawking, MD;  Location: [facility] ENDOSCOPY;  Service: Endoscopy;  Laterality: N/A;  . SHOULDER SURGERY       OB History   No obstetric history on file.      Home Medications    Prior to Admission medications   Medication Sig Start Date End Date Taking? Authorizing Provider  aspirin 81 MG tablet Take 81 mg by mouth daily.    [provider]  cephALEXin (KEFLEX) 500 MG capsule Take 1 capsule (500 mg total) by mouth 4 (four) times daily for 10 days. 09/19/18 09/29/18  Joy, Shawn C, PA-C  chlorhexidine (PERIDEX) 0.12 % solution RINSE WITH 1/2 OZ TWICE A DAY AFTER BRUSHING. DO NOT EAT,DRINK OR RINSE FOR 30 MINUTES AFTER 05/30/18   [provider]  [facility] B-12 500 MCG tablet TAKE 1 TABLET (500 MCG TOTAL) BY MOUTH DAILY. 04/30/18   Myles Lipps, MD  ferrous gluconate (FERGON) 324 MG tablet Take 1 tablet (324 mg total) by mouth daily with breakfast. 02/06/18   Myles Lipps, MD  HYDROcodone-acetaminophen (NORCO/VICODIN) 5-325 MG tablet Take 1 tablet by mouth every 6 (six) hours as needed for severe pain. 09/19/18   Joy, Shawn C, PA-C  levothyroxine (SYNTHROID, LEVOTHROID) 100 MCG tablet TAKE 1 TABLET (100 MCG TOTAL) BY MOUTH DAILY BEFORE BREAKFAST. 07/02/18   Myles Lipps, MD  Multiple Vitamin (MULTIVITAMIN WITH MINERALS) TABS Take 1 tablet by mouth daily.  [provider]  naproxen sodium (ANAPROX) 220 MG tablet Take 220 mg by mouth daily as needed (pain).     [provider]  olmesartan (BENICAR) 20 MG tablet Take 1 tablet (20 mg total) by mouth daily. 08/24/18   Myles Lipps, MD  polyethylene glycol powder (GLYCOLAX/MIRALAX) powder Take 17 g by mouth daily. 03/02/18   Myles Lipps, MD  rosuvastatin (CRESTOR) 10 MG tablet Take 1 tablet (10 mg total) by mouth daily. 11/16/13   Carmelina Dane, MD    Family History History reviewed.  No pertinent family history.  Social History Social History   Tobacco Use  . Smoking status: Never Smoker  . Smokeless tobacco: Never Used  Substance Use Topics  . Alcohol use: No    Alcohol/week: 0.0 standard drinks  . Drug use: No     Allergies   Patient has no known allergies.   Review of Systems Review of Systems  All other systems reviewed and are negative.    Physical Exam Updated Vital Signs BP 140/72 (BP Location: Left Arm)   Pulse 72   Temp 98.8 F (37.1 C) (Oral)   Resp 18   SpO2 94%   Physical Exam Vitals signs and nursing note reviewed.  Constitutional:      Appearance: She is well-developed.  HENT:     Head: Normocephalic and atraumatic.  Cardiovascular:     Rate and Rhythm: Normal rate and regular rhythm.     Heart sounds: No murmur.  Pulmonary:     Effort: Pulmonary effort is normal. No respiratory distress.     Breath sounds: Normal breath sounds.  Abdominal:     Palpations: Abdomen is soft.     Tenderness: There is no abdominal tenderness. There is no right CVA tenderness, left CVA tenderness, guarding or rebound.  Musculoskeletal:        General: No tenderness.  Skin:    General: Skin is warm and dry.     Capillary Refill: Capillary refill takes less than 2 seconds.  Neurological:     Mental Status: She is alert and oriented to person, place, and time.  Psychiatric:        Mood and Affect: Mood normal.        Behavior: Behavior normal.      ED Treatments / Results  Labs (all labs ordered are listed, but only abnormal results are displayed) Labs Reviewed  BASIC METABOLIC PANEL - Abnormal; Notable for the following components:      Result Value   Glucose, Bld 100 (*)    All other components within normal limits  CBC - Abnormal; Notable for the following components:   Hemoglobin 11.4 (*)    HCT 34.6 (*)    MCV 77.2 (*)    MCH 25.4 (*)    All other components within normal limits    EKG None  Radiology No results  found.  Procedures Procedures (including critical care time)  Medications Ordered in ED Medications - No data to display   Initial Impression / Assessment and Plan / ED Course  I have reviewed the triage vital signs and the nursing notes.  Pertinent labs & imaging results that were available during my care of the patient were reviewed by me and considered in my medical decision making (see chart for details).    Patient here for evaluation of ESBL UTI on culture. She is non-toxic appearing on evaluation but does have persistent symptoms. Her low back pain appears to  be musculoskeletal in nature, pyelonephritis is not likely at this time. Discussed culture findings with pharmacist. Will treat with Augmentin 875 BID for 10 days with close return precautions. Presentation is not c/w sepsis.  Final Clinical Impressions(s) / ED Diagnoses   Final diagnoses:  None    ED Discharge Orders    None       Tilden Fossaees, Deleah Tison, MD 09/22/18 1709

## 2018-09-22 NOTE — ED Notes (Signed)
Patient verbalizes understanding of discharge instructions. Opportunity for questioning and answers were provided. Armband removed by staff, pt discharged from ED.  

## 2018-09-27 ENCOUNTER — Encounter: Payer: 59 | Admitting: Orthotics

## 2018-09-28 ENCOUNTER — Telehealth: Payer: Self-pay | Admitting: Family Medicine

## 2018-09-28 ENCOUNTER — Other Ambulatory Visit: Payer: Self-pay | Admitting: Family Medicine

## 2018-09-28 MED ORDER — HYDROCODONE-ACETAMINOPHEN 5-325 MG PO TABS: 1.0000 | ORAL_TABLET | Freq: Four times a day (QID) | ORAL | 0 refills | Status: DC | PRN

## 2018-09-28 NOTE — Telephone Encounter (Signed)
Please advise 

## 2018-09-28 NOTE — Telephone Encounter (Signed)
Copied from CRM [id number]. Topic: Quick Communication - See Telephone Encounter >> Sep 28, 2018 12:58 PM Herby Abraham C wrote: CRM for notification. See Telephone encounter for: 09/28/18.  Pt called in requesting something for pain. Pt says that she was in a lot of pain through the night. Pt says that it felt like someone was kicking her in the back. Pt says that she was just seen for this at the hospital and need to follow up with PCP. Pt says that she has been taking Motrin as per advised by the hospital but she would like to know if provider could prescribe something else to help her pain.   Please advise.

## 2018-09-28 NOTE — Telephone Encounter (Signed)
Seen in ED on 09/22/2018 - UTI with ESBL, + 83mm stone Sent in rx for hydrocodone I would like to see her, thanks

## 2018-10-05 ENCOUNTER — Other Ambulatory Visit: Payer: Self-pay

## 2018-10-05 ENCOUNTER — Encounter: Payer: Self-pay | Admitting: Family Medicine

## 2018-10-05 ENCOUNTER — Ambulatory Visit: Payer: 59 | Admitting: Family Medicine

## 2018-10-05 VITALS — BP 140/74 | HR 76 | Temp 98.5°F | Ht 61.0 in | Wt 215.0 lb

## 2018-10-05 DIAGNOSIS — N200 Calculus of kidney: Secondary | ICD-10-CM

## 2018-10-05 DIAGNOSIS — R300 Dysuria: Secondary | ICD-10-CM | POA: Diagnosis not present

## 2018-10-05 DIAGNOSIS — Z1612 Extended spectrum beta lactamase (ESBL) resistance: Secondary | ICD-10-CM

## 2018-10-05 DIAGNOSIS — M545 Low back pain, unspecified: Secondary | ICD-10-CM

## 2018-10-05 DIAGNOSIS — N390 Urinary tract infection, site not specified: Secondary | ICD-10-CM | POA: Diagnosis not present

## 2018-10-05 DIAGNOSIS — B9629 Other Escherichia coli [E. coli] as the cause of diseases classified elsewhere: Secondary | ICD-10-CM

## 2018-10-05 LAB — POCT URINALYSIS DIP (MANUAL ENTRY)
Bilirubin, UA: NEGATIVE
Glucose, UA: NEGATIVE mg/dL
Ketones, POC UA: NEGATIVE mg/dL
Leukocytes, UA: NEGATIVE
Nitrite, UA: NEGATIVE
Protein Ur, POC: 100 mg/dL — AB
Spec Grav, UA: >=1.03 — AB (ref 1.010–1.025)
Urobilinogen, UA: 0.2 E.U./dL
pH, UA: 5.5 (ref 5.0–8.0)

## 2018-10-05 LAB — POC MICROSCOPIC URINALYSIS (UMFC): Mucus: ABSENT

## 2018-10-05 MED ORDER — CYCLOBENZAPRINE HCL 10 MG PO TABS: 5.0000 mg | ORAL_TABLET | Freq: Three times a day (TID) | ORAL | 0 refills | Status: DC | PRN

## 2018-10-05 NOTE — Progress Notes (Signed)
01/28/203:40 PM  Anna Bernard 02-Feb-1951, 68 y.o. female [id number]  Chief Complaint  Patient presents with  . Follow-up    kidney stones giving her lots of pain on both sides of her back.     HPI:   Patient is a 69 y.o. female with past medical history significant for dizziness,HTN, HLP,diabetes,microcystic anemia,vitamin B12 deficiencyandhypothyroidism who presents today for back pain and dysuria  Seen in ER 09/19/2018 - CT showed 76mm kidney stone, also treated for UTI with rocephin x1 and keflex x 7 days  Seen then again in ER 09/22/2018 after being called back for urine cx growing ESBL, changed to augmentin x 10 days  Saw her urology yesterday, started trimethoprim 100mg  daily  No more burning when she urinates Never had hematuria Ran out of vicodin Taking ibuprofen 800mg  TID, started today, doing ok Has been doing heating pad which  Having left lower back pain Worse when she lies down Does not go down her leg  Her sister just died  Fall Risk  Oct 16, 2018 2018-09-04 06/14/2018 04/17/2018 03/09/2018  Falls in the past year? 0 0 No No No     Depression screen [facility] 2/9 10/16/2018 04-Sep-2018 06/14/2018  Decreased Interest 0 0 0  Down, Depressed, Hopeless 0 0 0  PHQ - 2 Score 0 0 0    No Known Allergies  Prior to Admission medications   Medication Sig Start Date End Date Taking? Authorizing Provider  amoxicillin-clavulanate (AUGMENTIN) 875-125 MG tablet Take 1 tablet by mouth every 12 (twelve) hours. 09/22/18  Yes Tilden Fossa, MD  aspirin 81 MG tablet Take 81 mg by mouth daily.   Yes [provider]  chlorhexidine (PERIDEX) 0.12 % solution RINSE WITH 1/2 OZ TWICE A DAY AFTER BRUSHING. DO NOT EAT,DRINK OR RINSE FOR 30 MINUTES AFTER 05/30/18  Yes [provider]  [facility] B-12 500 MCG tablet TAKE 1 TABLET (500 MCG TOTAL) BY MOUTH DAILY. 04/30/18  Yes Myles Lipps, MD  ferrous gluconate (FERGON) 324 MG tablet Take 1 tablet (324 mg total) by mouth daily  with breakfast. 02/06/18  Yes Myles Lipps, MD  HYDROcodone-acetaminophen (NORCO/VICODIN) 5-325 MG tablet Take 1 tablet by mouth every 6 (six) hours as needed for severe pain. 09/28/18  Yes Myles Lipps, MD  levothyroxine (SYNTHROID, LEVOTHROID) 100 MCG tablet TAKE 1 TABLET (100 MCG TOTAL) BY MOUTH DAILY BEFORE BREAKFAST. 07/02/18  Yes Myles Lipps, MD  Multiple Vitamin (MULTIVITAMIN WITH MINERALS) TABS Take 1 tablet by mouth daily.   Yes [provider]  naproxen sodium (ANAPROX) 220 MG tablet Take 220 mg by mouth daily as needed (pain).    Yes [provider]  olmesartan (BENICAR) 20 MG tablet Take 1 tablet (20 mg total) by mouth daily. September 04, 2018  Yes Myles Lipps, MD  polyethylene glycol powder (GLYCOLAX/MIRALAX) powder Take 17 g by mouth daily. 03/02/18  Yes Myles Lipps, MD  rosuvastatin (CRESTOR) 10 MG tablet Take 1 tablet (10 mg total) by mouth daily. 11/16/13  Yes Carmelina Dane, MD  trimethoprim (TRIMPEX) 100 MG tablet  10/04/18  Yes [provider]    Past Medical History:  Diagnosis Date  . Anemia, unspecified   . Hyperlipidemia, unspecified   . Hypertension   . Hypothyroidism, unspecified   . Pre-diabetes   . Vitamin B12 deficiency     Past Surgical History:  Procedure Laterality Date  . ABDOMINAL HYSTERECTOMY    . GIVENS CAPSULE STUDY N/A 04/16/2018   Procedure: GIVENS CAPSULE  STUDY;  Surgeon: Jeani HawkingHung, Patrick, MD;  [facility];  Service: Endoscopy;  Laterality: N/A;  . SHOULDER SURGERY      Social History   Tobacco Use  . Smoking status: Never Smoker  . Smokeless tobacco: Never Used  Substance Use Topics  . Alcohol use: No    Alcohol/week: 0.0 standard drinks    History reviewed. No pertinent family history.  ROS Per hpi  OBJECTIVE: Blood pressure 140/74, pulse 76, temperature 98.5 F (36.9 C), temperature source Oral, height 5\' 1"  (1.549 m), weight 215 lb (97.5 kg), SpO2 99 %. Body mass index is 40.62  kg/m.   Physical Exam Vitals signs and nursing note reviewed.  Constitutional:      Appearance: She is well-developed.  HENT:     Head: Normocephalic and atraumatic.  Eyes:     General: No scleral icterus.    Conjunctiva/sclera: Conjunctivae normal.     Pupils: Pupils are equal, round, and reactive to light.  Neck:     Musculoskeletal: Neck supple.  Pulmonary:     Effort: Pulmonary effort is normal.  Musculoskeletal:     Lumbar back: She exhibits tenderness (left paraspinal) and spasm.  Skin:    General: Skin is warm and dry.  Neurological:     Mental Status: She is alert and oriented to person, place, and time.     Deep Tendon Reflexes: Reflexes are normal and symmetric.     Results for orders placed or performed in visit on 10/05/18 (from the past 24 hour(s))  POCT urinalysis dipstick     Status: Abnormal   Collection Time: 10/05/18  3:52 PM  Result Value Ref Range   Color, UA yellow yellow   Clarity, UA cloudy (A) clear   Glucose, UA negative negative mg/dL   Bilirubin, UA negative negative   Ketones, POC UA negative negative mg/dL   Spec Grav, UA >=1.610>=1.030 (A) 1.010 - 1.025   Blood, UA trace-lysed (A) negative   pH, UA 5.5 5.0 - 8.0   Protein Ur, POC =100 (A) negative mg/dL   Urobilinogen, UA 0.2 0.2 or 1.0 E.U./dL   Nitrite, UA Negative Negative   Leukocytes, UA Negative Negative  POCT Microscopic Urinalysis ([facility])     Status: Abnormal   Collection Time: 10/05/18  4:20 PM  Result Value Ref Range   WBC,UR,HPF,POC Few (A) None WBC/hpf   RBC,UR,HPF,POC None None RBC/hpf   Bacteria Few (A) None, Too numerous to count   Mucus Absent Absent   Epithelial Cells, UR Per Microscopy Many (A) None, Too numerous to count cells/hpf     ASSESSMENT and PLAN  1. Acute left-sided low back pain without sciatica Discussed supportive measures, new meds r/se/b and RTC precautions.   2. Dysuria 3. UTI due to extended-spectrum beta lactamase (ESBL) producing Escherichia  coli 4. Kidney stone Being managed by urology, on suppresive abx treatment - POCT urinalysis dipstick - POCT Microscopic Urinalysis ([facility]) - Care order/instruction:    Other orders - trimethoprim (TRIMPEX) 100 MG tablet - cyclobenzaprine (FLEXERIL) 10 MG tablet; Take 0.5-1 tablets (5-10 mg total) by mouth 3 (three) times daily as needed for muscle spasms.  No follow-ups on file.    Myles LippsIrma M Santiago, MD [facility] [address] [place], [place] [id number] Ph.  [phone] Fax [phone]

## 2018-10-23 ENCOUNTER — Encounter: Payer: Self-pay | Admitting: *Deleted

## 2018-10-23 ENCOUNTER — Telehealth: Payer: Self-pay | Admitting: Sports Medicine

## 2018-10-23 NOTE — Telephone Encounter (Signed)
I informed pt, Dr. Marylene Land had okayed her to be out of work until reevaluated 10/30/2018. Pt requested to be emailed to [email or web address]

## 2018-10-23 NOTE — Telephone Encounter (Signed)
Yes I ok with out of work note until her next office visit -Dr. Marylene LandStover

## 2018-10-23 NOTE — Telephone Encounter (Signed)
Pt called with severe foot pain, pt is scheduled for appt on 10/30/18 but would like to know what she can do to help with the pain in the meantime. Please give pt a call.

## 2018-10-23 NOTE — Telephone Encounter (Signed)
I called pt, she states she is having so much pain she is barely able to get to the car after work and she is about to retire and she just can't do it. I instructed pt to rest, ice and elevate as much as possible and if possible wear the brace she had prior to the 09/04/2018 office visit for comfort and support, or wear supportive shoe. Pt requested Dr. Marylene Land write her out of work until her appt 10/30/2018. I told pt I would put her on the Cancellation Schedule and inform Dr. Marylene Land of her out-of-work request.

## 2018-10-23 NOTE — Telephone Encounter (Signed)
Note was emailed.

## 2018-10-30 ENCOUNTER — Ambulatory Visit: Payer: 59 | Admitting: Sports Medicine

## 2018-10-30 ENCOUNTER — Encounter: Payer: Self-pay | Admitting: Sports Medicine

## 2018-10-30 ENCOUNTER — Telehealth: Payer: Self-pay | Admitting: *Deleted

## 2018-10-30 DIAGNOSIS — M2141 Flat foot [pes planus] (acquired), right foot: Secondary | ICD-10-CM

## 2018-10-30 DIAGNOSIS — M2170 Unequal limb length (acquired), unspecified site: Secondary | ICD-10-CM

## 2018-10-30 DIAGNOSIS — M76821 Posterior tibial tendinitis, right leg: Secondary | ICD-10-CM | POA: Diagnosis not present

## 2018-10-30 DIAGNOSIS — M7751 Other enthesopathy of right foot: Secondary | ICD-10-CM | POA: Diagnosis not present

## 2018-10-30 DIAGNOSIS — M2142 Flat foot [pes planus] (acquired), left foot: Secondary | ICD-10-CM

## 2018-10-30 DIAGNOSIS — M66871 Spontaneous rupture of other tendons, right ankle and foot: Secondary | ICD-10-CM

## 2018-10-30 MED ORDER — TRIAMCINOLONE ACETONIDE 10 MG/ML IJ SUSP
10.0000 mg | Freq: Once | INTRAMUSCULAR | Status: AC
  Administered 2018-10-30: 10 mg

## 2018-10-30 NOTE — Progress Notes (Signed)
Subjective: Anna Bernard is a 68 y.o. female patient who returns o office for evaluation of R>L foot pain. Patient reports pain has flared back up. Injection helped but now pain is back 8/10 with swelling can not wear safety shoe at work. Reports that her custom insoles do no help. Reports she is tired of dealing with the pain has 3 weeks to the retirement and has been using walker due to pain in other areas.    Patient Active Problem List   Diagnosis Date Noted  . Vitamin B12 deficiency 02/06/2018  . Sepsis secondary to UTI (HCC) 01/29/2018  . Microcytic anemia 01/29/2018  . Renal insufficiency 01/29/2018  . Pure hypercholesterolemia 12/24/2014  . Diabetes mellitus type 2, controlled, without complications (HCC) 12/24/2014  . Bruit of right carotid artery 12/24/2014  . Thyroid disease 07/06/2014  . Severe obesity (BMI >= 40) (HCC) 07/06/2014  . HTN (hypertension) 07/06/2014    Current Outpatient Medications on File Prior to Visit  Medication Sig Dispense Refill  . amoxicillin-clavulanate (AUGMENTIN) 875-125 MG tablet Take 1 tablet by mouth every 12 (twelve) hours. 20 tablet 0  . aspirin 81 MG tablet Take 81 mg by mouth daily.    . chlorhexidine (PERIDEX) 0.12 % solution RINSE WITH 1/2 OZ TWICE A DAY AFTER BRUSHING. DO NOT EAT,DRINK OR RINSE FOR 30 MINUTES AFTER  0  . [facility] B-12 500 MCG tablet TAKE 1 TABLET (500 MCG TOTAL) BY MOUTH DAILY. 30 tablet 2  . cyclobenzaprine (FLEXERIL) 10 MG tablet Take 0.5-1 tablets (5-10 mg total) by mouth 3 (three) times daily as needed for muscle spasms. 20 tablet 0  . ferrous gluconate (FERGON) 324 MG tablet Take 1 tablet (324 mg total) by mouth daily with breakfast. 30 tablet 3  . levothyroxine (SYNTHROID, LEVOTHROID) 100 MCG tablet TAKE 1 TABLET (100 MCG TOTAL) BY MOUTH DAILY BEFORE BREAKFAST. 90 tablet 2  . Multiple Vitamin (MULTIVITAMIN WITH MINERALS) TABS Take 1 tablet by mouth daily.    . naproxen sodium (ANAPROX) 220 MG tablet Take 220 mg by mouth  daily as needed (pain).     Marland Kitchen olmesartan (BENICAR) 20 MG tablet Take 1 tablet (20 mg total) by mouth daily. 90 tablet 1  . polyethylene glycol powder (GLYCOLAX/MIRALAX) powder Take 17 g by mouth daily. 250 g 1  . rosuvastatin (CRESTOR) 10 MG tablet Take 1 tablet (10 mg total) by mouth daily. 90 tablet 1  . trimethoprim (TRIMPEX) 100 MG tablet      No current facility-administered medications on file prior to visit.     No Known Allergies  Objective:  General: Alert and oriented x3 in no acute distress  Dermatology: No open lesions bilateral lower extremities, no webspace macerations, no ecchymosis bilateral, all nails x 10 are well manicured.  Vascular: Dorsalis Pedis and Posterior Tibial pedal pulses palpable, Capillary Fill Time 3 seconds,(+) pedal hair growth bilateral, no edema bilateral lower extremities, Temperature gradient within normal limits.  Neurology: Michaell Cowing sensation intact via light touch bilateral.  Musculoskeletal: Mild tenderness with palpation PT tendon course on Right, + Pes planus bilateral + Limb left difference with Left side being shorter. Strength within normal limits in all groups bilateral.    Assessment and Plan: Problem List Items Addressed This Visit    None    Visit Diagnoses    Posterior tibial tendinitis of right lower extremity    -  Primary   Relevant Medications   triamcinolone acetonide (KENALOG) 10 MG/ML injection 10 mg (Completed) (Start on 10/30/2018  1:00 PM)   Capsulitis of ankle, right       Relevant Medications   triamcinolone acetonide (KENALOG) 10 MG/ML injection 10 mg (Completed) (Start on 10/30/2018  1:00 PM)   Lower limb length difference       Pes planus of both feet           -Complete examination performed -Re-Discussed treatement options for tendonitis on right and short leg on left -After oral consent and aseptic prep, injected a mixture containing 1 ml of 2%  plain lidocaine, 1 ml 0.5% plain marcaine, 0.5 ml of kenalog 10  and 0.5 ml of dexamethasone phosphate into Right PT tendon course without complication. Post-injection care discussed with patient.  This is injection  Raiford Noble saw patient that will make adjustments to her insoles -Order MRI to further eval PT to r/o tear in the setting of continued pain  -Work note given  -Patient to return to office after MRI or sooner if condition worsens.  Asencion Islam, DPM

## 2018-10-30 NOTE — Telephone Encounter (Signed)
Patient had seen Dr. Marylene Land earlier today and wanted to confirm she should continue taking her medication.  I called patient at [phone] (Cell #) to let them know that Dr. Marylene Land does want them to continue to take their medication. Patient stated she understood.

## 2018-10-30 NOTE — Telephone Encounter (Signed)
-----   Message from Asencion Islam, [place] sent at 10/30/2018 10:26 AM EST ----- Regarding: MRI R foot and Ankle R/o tear at PT tendon

## 2018-10-30 NOTE — Telephone Encounter (Signed)
Orders to J. Quintana, RN for pre-cert, faxed to [facility]. 

## 2018-11-02 ENCOUNTER — Ambulatory Visit
Admission: RE | Admit: 2018-11-02 | Discharge: 2018-11-02 | Disposition: A | Discharge status: Home / Self Care | Payer: 59 | Source: Ambulatory Visit | Attending: Family Medicine | Admitting: Family Medicine

## 2018-11-02 DIAGNOSIS — E2839 Other primary ovarian failure: Secondary | ICD-10-CM

## 2018-11-06 ENCOUNTER — Encounter: Payer: Self-pay | Admitting: Radiology

## 2018-11-11 ENCOUNTER — Ambulatory Visit
Admission: RE | Admit: 2018-11-11 | Discharge: 2018-11-11 | Disposition: A | Discharge status: Home / Self Care | Payer: 59 | Source: Ambulatory Visit | Attending: Sports Medicine | Admitting: Sports Medicine

## 2018-11-11 DIAGNOSIS — M76821 Posterior tibial tendinitis, right leg: Secondary | ICD-10-CM

## 2018-11-11 DIAGNOSIS — M7751 Other enthesopathy of right foot: Secondary | ICD-10-CM

## 2018-11-13 ENCOUNTER — Telehealth: Payer: Self-pay | Admitting: *Deleted

## 2018-11-13 NOTE — Telephone Encounter (Signed)
-----   Message from [place], [place] sent at 11/12/2018 10:32 AM EST ----- Please inform patient that there is NO TEAR and that her MRI shows lots of arthritis which could be adding to her pain and swelling. There is no cure for arthritis however there are Rx anti-inflammatories that Mobic that may be able to help along with supportive orthotics to take stress off the joints of which she is already seeing Raiford Noble for Thanks Dr. Marylene Land

## 2018-11-13 NOTE — Telephone Encounter (Signed)
She can continue motrin, if no improvement we can try Mobic 7.5mg  daily to prevent any issues with her kidneys -Dr. Marylene Land

## 2018-11-13 NOTE — Telephone Encounter (Signed)
I informed pt of Dr. Wynema Birch review of MRI results and recommendation of antiinflammatory medications. Pt states the hospital put her on motrin for her legs when she was seen for kidney problems. I told pt I would ask Dr. Marylene Land if she should continue the motrin. Pt states she is to take 3 motrin but some times just takes 2 because she can't feel her legs.

## 2018-11-14 ENCOUNTER — Other Ambulatory Visit: Payer: Self-pay | Admitting: Family Medicine

## 2018-11-14 NOTE — Telephone Encounter (Signed)
I informed pt of Dr. Wynema Birch orders and pt asked if she should return to discuss her return to work. I transferred pt to scheduler for appt.

## 2018-11-14 NOTE — Telephone Encounter (Signed)
Requested Prescriptions  Pending Prescriptions Disp Refills  . olmesartan (BENICAR) 20 MG tablet [Pharmacy Med Name: OLMESARTAN MEDOXOMIL 20 MG TAB] 45 tablet 1    Sig: TAKE 1/2 TABLET BY MOUTH DAILY     Cardiovascular:  Angiotensin Receptor Blockers Failed - 11/14/2018  1:19 AM      Failed - Last BP in normal range    BP Readings from Last 1 Encounters:  10/05/18 140/74         Passed - Cr in normal range and within 180 days    Creat  Date Value Ref Range Status  04/21/2013 0.52 0.50 - 1.10 mg/dL Final   Creatinine, Ser  Date Value Ref Range Status  09/22/2018 0.83 0.44 - 1.00 mg/dL Final         Passed - K in normal range and within 180 days    Potassium  Date Value Ref Range Status  09/22/2018 3.9 3.5 - 5.1 mmol/L Final         Passed - Patient is not pregnant      Passed - Valid encounter within last 6 months    Recent Outpatient Visits          1 month ago Acute left-sided low back pain without sciatica   [facility], Meda Coffee, MD   2 months ago Controlled type 2 diabetes mellitus without complication, without long-term current use of insulin [facility])   [facility], Meda Coffee, MD   5 months ago Orthostatic hypotension   [facility], Meda Coffee, MD   5 months ago Orthostatic hypotension   [facility], Meda Coffee, MD   7 months ago Dizziness   [facility], Meda Coffee, MD      Future Appointments            In 1 week Myles Lipps, MD Primary Care at [place], [facility]

## 2018-11-20 ENCOUNTER — Encounter: Payer: Self-pay | Admitting: Sports Medicine

## 2018-11-20 ENCOUNTER — Ambulatory Visit: Payer: 59 | Admitting: Sports Medicine

## 2018-11-20 DIAGNOSIS — M7751 Other enthesopathy of right foot: Secondary | ICD-10-CM | POA: Diagnosis not present

## 2018-11-20 DIAGNOSIS — M76821 Posterior tibial tendinitis, right leg: Secondary | ICD-10-CM | POA: Diagnosis not present

## 2018-11-20 DIAGNOSIS — M2142 Flat foot [pes planus] (acquired), left foot: Secondary | ICD-10-CM

## 2018-11-20 DIAGNOSIS — M2170 Unequal limb length (acquired), unspecified site: Secondary | ICD-10-CM | POA: Diagnosis not present

## 2018-11-20 DIAGNOSIS — M722 Plantar fascial fibromatosis: Secondary | ICD-10-CM

## 2018-11-20 DIAGNOSIS — M2141 Flat foot [pes planus] (acquired), right foot: Secondary | ICD-10-CM | POA: Diagnosis not present

## 2018-11-20 MED ORDER — MELOXICAM 7.5 MG PO TABS: 7.5000 mg | ORAL_TABLET | Freq: Every day | ORAL | 0 refills | Status: DC

## 2018-11-20 NOTE — Progress Notes (Signed)
Subjective: AZMARIAH SUPINGER is a 68 y.o. female patient who returns o office for evaluation of R>L foot pain. Patient reports pain is about the same her Motrin helps a little however wants to discuss further treatments for pain and inflammation and arthritis that was identified on her MRI.  Patient reports that because of her pain it is difficult for her to walk and stand and that her job has already accommodated her in the past with light duty and she has not been able to return to work patient has been out of work and is due to return on 11/26/2018 however is concerned with her inability to walk and stand due to pain and questions the idea of if she needs to go ahead to retire due to her medical condition.  Patient denies any change in symptoms and reports that today the bottom of her heel hurts a little more than the side of her ankle today. No other issues noted  Patient Active Problem List   Diagnosis Date Noted  . Vitamin B12 deficiency 02/06/2018  . Sepsis secondary to UTI (HCC) 01/29/2018  . Microcytic anemia 01/29/2018  . Renal insufficiency 01/29/2018  . Pure hypercholesterolemia 12/24/2014  . Diabetes mellitus type 2, controlled, without complications (HCC) 12/24/2014  . Bruit of right carotid artery 12/24/2014  . Thyroid disease 07/06/2014  . Severe obesity (BMI >= 40) (HCC) 07/06/2014  . HTN (hypertension) 07/06/2014    Current Outpatient Medications on File Prior to Visit  Medication Sig Dispense Refill  . aspirin 81 MG tablet Take 81 mg by mouth daily.    . chlorhexidine (PERIDEX) 0.12 % solution RINSE WITH 1/2 OZ TWICE A DAY AFTER BRUSHING. DO NOT EAT,DRINK OR RINSE FOR 30 MINUTES AFTER  0  . CVS B-12 500 MCG tablet TAKE 1 TABLET (500 MCG TOTAL) BY MOUTH DAILY. 30 tablet 2  . cyclobenzaprine (FLEXERIL) 10 MG tablet Take 0.5-1 tablets (5-10 mg total) by mouth 3 (three) times daily as needed for muscle spasms. 20 tablet 0  . ferrous gluconate (FERGON) 324 MG tablet Take 1 tablet  (324 mg total) by mouth daily with breakfast. 30 tablet 3  . levothyroxine (SYNTHROID, LEVOTHROID) 100 MCG tablet TAKE 1 TABLET (100 MCG TOTAL) BY MOUTH DAILY BEFORE BREAKFAST. 90 tablet 2  . Multiple Vitamin (MULTIVITAMIN WITH MINERALS) TABS Take 1 tablet by mouth daily.    . naproxen sodium (ANAPROX) 220 MG tablet Take 220 mg by mouth daily as needed (pain).     Marland Kitchen olmesartan (BENICAR) 20 MG tablet TAKE 1/2 TABLET BY MOUTH DAILY 45 tablet 0  . polyethylene glycol powder (GLYCOLAX/MIRALAX) powder Take 17 g by mouth daily. 250 g 1  . rosuvastatin (CRESTOR) 10 MG tablet Take 1 tablet (10 mg total) by mouth daily. 90 tablet 1  . trimethoprim (TRIMPEX) 100 MG tablet      No current facility-administered medications on file prior to visit.     No Known Allergies  Objective:  General: Alert and oriented x3 in no acute distress  Dermatology: No open lesions bilateral lower extremities, no webspace macerations, no ecchymosis bilateral, all nails x 10 are well manicured.  Vascular: Dorsalis Pedis and Posterior Tibial pedal pulses palpable, Capillary Fill Time 3 seconds,(+) pedal hair growth bilateral, no edema bilateral lower extremities, Temperature gradient within normal limits.  Neurology: Michaell Cowing sensation intact via light touch bilateral.  Musculoskeletal: Mild tenderness with palpation plantar fascia insertion on right heel and pain at PT tendon course on Right, + Pes planus  bilateral + Limb left difference with Left side being shorter. Strength within normal limits in all groups bilateral.    Assessment and Plan: Problem List Items Addressed This Visit    None    Visit Diagnoses    Posterior tibial tendinitis of right lower extremity    -  Primary   Pes planus of both feet       Lower limb length difference       Capsulitis of ankle, right       Relevant Medications   meloxicam (MOBIC) 7.5 MG tablet   Plantar fasciitis         -Complete examination performed -Re-Discussed  treatement options for tendonitis on right and short leg on left and now fasciitis  -Reviewed previous MRI findings with patient -Patient declined steroid injection at today's visit to right heel -Prescribed meloxicam 7.5 mg to take daily in place of previous use of Motrin -Patient is awaiting to get her adjusted orthotics from [place]; I advised patient if these modified orthotics do not work may benefit from a Richie brace -Recommend continue with no work through 3/31 and since her job cannot accommodate her with lighter duty would recommend for patient to retire on 4/1 due to her medical conditions and ongoing foot pain -Spent extensive time beyond 30 minutes talking with patient and coordinating care -Patient to return to office to see Raiford Noble or sooner if condition worsens.  Asencion Islam, DPM

## 2018-11-20 NOTE — Patient Instructions (Signed)

## 2018-11-21 ENCOUNTER — Encounter: Payer: Self-pay | Admitting: Sports Medicine

## 2018-11-23 ENCOUNTER — Ambulatory Visit: Payer: 59 | Admitting: Family Medicine

## 2018-11-25 ENCOUNTER — Other Ambulatory Visit: Payer: Self-pay | Admitting: Family Medicine

## 2018-11-26 ENCOUNTER — Other Ambulatory Visit: Payer: Self-pay | Admitting: Family Medicine

## 2018-11-26 NOTE — Telephone Encounter (Signed)
Requested medication (s) are due for refill today: yes  Requested medication (s) are on the active medication list: no  Last refill:  03/25/18  Future visit scheduled: no  Notes to clinic:  Prescription expired 04/18/18   Requested Prescriptions  Pending Prescriptions Disp Refills   hydrochlorothiazide (HYDRODIURIL) 25 MG tablet [Pharmacy Med Name: HYDROCHLOROTHIAZIDE 25 MG TAB] 15 tablet 1    Sig: TAKE 0.5 TABLETS (12.5 MG TOTAL) BY MOUTH DAILY.     Cardiovascular: Diuretics - Thiazide Failed - 11/26/2018  2:21 AM      Failed - Last BP in normal range    BP Readings from Last 1 Encounters:  10/05/18 140/74         Passed - Ca in normal range and within 360 days    Calcium  Date Value Ref Range Status  09/22/2018 9.1 8.9 - 10.3 mg/dL Final         Passed - Cr in normal range and within 360 days    Creat  Date Value Ref Range Status  04/21/2013 0.52 0.50 - 1.10 mg/dL Final   Creatinine, Ser  Date Value Ref Range Status  09/22/2018 0.83 0.44 - 1.00 mg/dL Final         Passed - K in normal range and within 360 days    Potassium  Date Value Ref Range Status  09/22/2018 3.9 3.5 - 5.1 mmol/L Final         Passed - Na in normal range and within 360 days    Sodium  Date Value Ref Range Status  09/22/2018 138 135 - 145 mmol/L Final  05/29/2018 143 134 - 144 mmol/L Final         Passed - Valid encounter within last 6 months    Recent Outpatient Visits          1 month ago Acute left-sided low back pain without sciatica   [facility], Meda Coffee, MD   3 months ago Controlled type 2 diabetes mellitus without complication, without long-term current use of insulin [facility])   [facility], Meda Coffee, MD   5 months ago Orthostatic hypotension   [facility], Meda Coffee, MD   6 months ago Orthostatic hypotension   [facility], Meda Coffee, MD   7 months ago Dizziness   [facility], Meda Coffee, MD

## 2018-11-29 ENCOUNTER — Ambulatory Visit: Payer: 59 | Admitting: Orthotics

## 2018-11-29 ENCOUNTER — Other Ambulatory Visit: Payer: Self-pay

## 2018-11-29 DIAGNOSIS — M76821 Posterior tibial tendinitis, right leg: Secondary | ICD-10-CM

## 2018-11-29 DIAGNOSIS — M2142 Flat foot [pes planus] (acquired), left foot: Secondary | ICD-10-CM

## 2018-11-29 DIAGNOSIS — M2170 Unequal limb length (acquired), unspecified site: Secondary | ICD-10-CM

## 2018-11-29 DIAGNOSIS — M2141 Flat foot [pes planus] (acquired), right foot: Secondary | ICD-10-CM

## 2018-11-29 DIAGNOSIS — M7751 Other enthesopathy of right foot: Secondary | ICD-10-CM

## 2018-11-29 NOTE — Progress Notes (Signed)
Patient came in today to pick up custom made foot orthotics.  The goals were accomplished and the patient reported no dissatisfaction with said orthotics.  Patient was advised of breakin period and how to report any issues. 

## 2018-12-10 ENCOUNTER — Other Ambulatory Visit: Payer: 59 | Admitting: Orthotics

## 2018-12-12 ENCOUNTER — Other Ambulatory Visit: Payer: Self-pay | Admitting: Sports Medicine

## 2019-01-05 ENCOUNTER — Other Ambulatory Visit: Payer: Self-pay | Admitting: Sports Medicine

## 2019-02-01 ENCOUNTER — Other Ambulatory Visit: Payer: Self-pay | Admitting: Sports Medicine

## 2019-02-14 ENCOUNTER — Telehealth: Payer: Self-pay | Admitting: Family Medicine

## 2019-02-14 NOTE — Telephone Encounter (Signed)
Copied from CRM [id number]. Topic: General - Other >> Feb 13, 2019  4:34 PM Debroah Loop wrote: Reason for CRM: Patient needs forms for disability placard to be provided and filled out by Dr. Leretha Pol. She would like it to be permanent.

## 2019-02-15 NOTE — Telephone Encounter (Signed)
After reviewing last podiatry note from march 2020, ok with permanent placard Ready for pickup

## 2019-02-15 NOTE — Telephone Encounter (Signed)
I have filled out the Handicap placard. Placed in provider folder for her part to be completed.

## 2019-02-27 ENCOUNTER — Other Ambulatory Visit: Payer: Self-pay | Admitting: Family Medicine

## 2019-04-10 ENCOUNTER — Telehealth: Payer: Self-pay | Admitting: Family Medicine

## 2019-05-03 DIAGNOSIS — H40033 Anatomical narrow angle, bilateral: Secondary | ICD-10-CM | POA: Diagnosis not present

## 2019-05-09 DIAGNOSIS — H40033 Anatomical narrow angle, bilateral: Secondary | ICD-10-CM | POA: Diagnosis not present

## 2019-05-09 DIAGNOSIS — H40031 Anatomical narrow angle, right eye: Secondary | ICD-10-CM | POA: Diagnosis not present

## 2019-05-15 ENCOUNTER — Other Ambulatory Visit: Payer: Self-pay | Admitting: Family Medicine

## 2019-05-15 NOTE — Telephone Encounter (Signed)
Requested medication (s) are due for refill today: yes  Requested medication (s) are on the active medication list: yes  Last refill: 02/27/2019  Future visit scheduled: no  Notes to clinic:  Per protocol unable to refill   Requested Prescriptions  Pending Prescriptions Disp Refills   olmesartan (BENICAR) 20 MG tablet [Pharmacy Med Name: OLMESARTAN MEDOXOMIL 20 MG TAB] 90 tablet 1    Sig: TAKE 1 TABLET BY MOUTH EVERY DAY     Cardiovascular:  Angiotensin Receptor Blockers Failed - 05/15/2019  1:28 AM      Failed - Cr in normal range and within 180 days    Creat  Date Value Ref Range Status  04/21/2013 0.52 0.50 - 1.10 mg/dL Final   Creatinine, Ser  Date Value Ref Range Status  09/22/2018 0.83 0.44 - 1.00 mg/dL Final         Failed - K in normal range and within 180 days    Potassium  Date Value Ref Range Status  09/22/2018 3.9 3.5 - 5.1 mmol/L Final         Failed - Last BP in normal range    BP Readings from Last 1 Encounters:  10/05/18 140/74         Failed - Valid encounter within last 6 months    Recent Outpatient Visits          7 months ago Acute left-sided low back pain without sciatica   [facility], Lilia Argue, MD   8 months ago Controlled type 2 diabetes mellitus without complication, without long-term current use of insulin [facility])   [facility] at [facility], Lilia Argue, MD   11 months ago Orthostatic hypotension   [facility], Lilia Argue, MD   11 months ago Orthostatic hypotension   [facility], Lilia Argue, MD   1 year ago Dizziness   [facility] at [facility], Lilia Argue, MD             Passed - Patient is not pregnant

## 2019-05-16 ENCOUNTER — Other Ambulatory Visit: Payer: Self-pay

## 2019-05-22 ENCOUNTER — Other Ambulatory Visit: Payer: Self-pay | Admitting: Family Medicine

## 2019-05-22 DIAGNOSIS — E079 Disorder of thyroid, unspecified: Secondary | ICD-10-CM

## 2019-05-22 NOTE — Telephone Encounter (Signed)
Requested medication (s) are due for refill today: yes  Requested medication (s) are on the active medication list: yes  Last refill:  02/23/2019  Future visit scheduled:no  Notes to clinic:  Review for refill   Requested Prescriptions  Pending Prescriptions Disp Refills   levothyroxine (SYNTHROID) 100 MCG tablet [Pharmacy Med Name: LEVOTHYROXINE 100 MCG TABLET] 90 tablet 2    Sig: TAKE 1 TABLET (100 MCG TOTAL) BY MOUTH DAILY BEFORE BREAKFAST.     Endocrinology:  Hypothyroid Agents Failed - 05/22/2019  1:37 AM      Failed - TSH needs to be rechecked within 3 months after an abnormal result. Refill until TSH is due.      Passed - TSH in normal range and within 360 days    TSH  Date Value Ref Range Status  08/24/2018 2.180 0.450 - 4.500 uIU/mL Final         Passed - Valid encounter within last 12 months    Recent Outpatient Visits          7 months ago Acute left-sided low back pain without sciatica   [facility] at [facility], Lilia Argue, MD   9 months ago Controlled type 2 diabetes mellitus without complication, without long-term current use of insulin [facility])   [facility], Lilia Argue, MD   11 months ago Orthostatic hypotension   [facility], Lilia Argue, MD   11 months ago Orthostatic hypotension   [facility], Lilia Argue, MD   1 year ago Dizziness   [facility] at [facility], Lilia Argue, MD

## 2019-06-06 DIAGNOSIS — H40032 Anatomical narrow angle, left eye: Secondary | ICD-10-CM | POA: Diagnosis not present

## 2019-06-06 DIAGNOSIS — H40033 Anatomical narrow angle, bilateral: Secondary | ICD-10-CM | POA: Diagnosis not present

## 2019-06-13 ENCOUNTER — Other Ambulatory Visit: Payer: Self-pay | Admitting: Family Medicine

## 2019-06-13 NOTE — Telephone Encounter (Signed)
Requested medication (s) are due for refill today: yes  Requested medication (s) are on the active medication list: yes  Last refill:  05/17/2019  Future visit scheduled: no  Notes to clinic: review for refill   Requested Prescriptions  Pending Prescriptions Disp Refills   olmesartan (BENICAR) 20 MG tablet [Pharmacy Med Name: OLMESARTAN MEDOXOMIL 20 MG TAB] 30 tablet 0    Sig: TAKE 1 TABLET BY MOUTH EVERY DAY     Cardiovascular:  Angiotensin Receptor Blockers Failed - 06/13/2019 10:30 AM      Failed - Cr in normal range and within 180 days    Creat  Date Value Ref Range Status  04/21/2013 0.52 0.50 - 1.10 mg/dL Final   Creatinine, Ser  Date Value Ref Range Status  09/22/2018 0.83 0.44 - 1.00 mg/dL Final         Failed - K in normal range and within 180 days    Potassium  Date Value Ref Range Status  09/22/2018 3.9 3.5 - 5.1 mmol/L Final         Failed - Last BP in normal range    BP Readings from Last 1 Encounters:  10/05/18 140/74         Failed - Valid encounter within last 6 months    Recent Outpatient Visits          8 months ago Acute left-sided low back pain without sciatica   [facility], Lilia Argue, MD   9 months ago Controlled type 2 diabetes mellitus without complication, without long-term current use of insulin [facility])   [facility], Lilia Argue, MD   12 months ago Orthostatic hypotension   [facility], Lilia Argue, MD   1 year ago Orthostatic hypotension   [facility] at [facility], Lilia Argue, MD   1 year ago Dizziness   [facility], Lilia Argue, MD             Passed - Patient is not pregnant

## 2019-06-22 ENCOUNTER — Other Ambulatory Visit: Payer: Self-pay | Admitting: Family Medicine

## 2019-06-22 NOTE — Telephone Encounter (Signed)
Requested medication (s) are due for refill today: yes  Requested medication (s) are on the active medication list: yes  Last refill:  05/17/2019  Future visit scheduled: no  Notes to clinic:  Review for refill   Requested Prescriptions  Pending Prescriptions Disp Refills   olmesartan (BENICAR) 20 MG tablet [Pharmacy Med Name: OLMESARTAN MEDOXOMIL 20 MG TAB] 30 tablet 0    Sig: TAKE 1 TABLET BY MOUTH EVERY DAY     Cardiovascular:  Angiotensin Receptor Blockers Failed - 06/22/2019 10:09 AM      Failed - Cr in normal range and within 180 days    Creat  Date Value Ref Range Status  04/21/2013 0.52 0.50 - 1.10 mg/dL Final   Creatinine, Ser  Date Value Ref Range Status  09/22/2018 0.83 0.44 - 1.00 mg/dL Final         Failed - K in normal range and within 180 days    Potassium  Date Value Ref Range Status  09/22/2018 3.9 3.5 - 5.1 mmol/L Final         Failed - Last BP in normal range    BP Readings from Last 1 Encounters:  10/05/18 140/74         Failed - Valid encounter within last 6 months    Recent Outpatient Visits          8 months ago Acute left-sided low back pain without sciatica   [facility], Lilia Argue, MD   10 months ago Controlled type 2 diabetes mellitus without complication, without long-term current use of insulin [facility])   [facility], Lilia Argue, MD   1 year ago Orthostatic hypotension   [facility], Lilia Argue, MD   1 year ago Orthostatic hypotension   [facility], Lilia Argue, MD   1 year ago Dizziness   [facility], Lilia Argue, MD      Future Appointments            In 5 days Rutherford Guys, MD Primary Care at [place], [place] - Patient is not pregnant

## 2019-06-25 ENCOUNTER — Other Ambulatory Visit: Payer: Self-pay

## 2019-06-25 NOTE — Patient Outreach (Signed)
[facility]) Care Management  06/25/2019  CAYLEEN BENJAMIN 02-26-1951 [id number]   Medication Adherence call to Mrs. [place] Compliant Voice message left with a call back number. Mrs. Maeda is showing past due on Rosuvastatin 10 mg and Olmesartan 20 mg under [facility] ins.   [place] Management Direct Dial [phone]  Fax [phone] [email or web address]

## 2019-06-27 ENCOUNTER — Encounter: Payer: Self-pay | Admitting: Family Medicine

## 2019-06-27 ENCOUNTER — Other Ambulatory Visit: Payer: Self-pay

## 2019-06-27 ENCOUNTER — Other Ambulatory Visit: Payer: Self-pay | Admitting: Family Medicine

## 2019-06-27 ENCOUNTER — Ambulatory Visit (INDEPENDENT_AMBULATORY_CARE_PROVIDER_SITE_OTHER): Payer: Medicare Other | Admitting: Family Medicine

## 2019-06-27 VITALS — BP 122/86 | HR 100 | Temp 98.3°F | Ht 61.0 in | Wt 234.0 lb

## 2019-06-27 DIAGNOSIS — R609 Edema, unspecified: Secondary | ICD-10-CM

## 2019-06-27 DIAGNOSIS — I10 Essential (primary) hypertension: Secondary | ICD-10-CM | POA: Diagnosis not present

## 2019-06-27 DIAGNOSIS — E119 Type 2 diabetes mellitus without complications: Secondary | ICD-10-CM

## 2019-06-27 DIAGNOSIS — E7800 Pure hypercholesterolemia, unspecified: Secondary | ICD-10-CM | POA: Diagnosis not present

## 2019-06-27 DIAGNOSIS — E039 Hypothyroidism, unspecified: Secondary | ICD-10-CM | POA: Diagnosis not present

## 2019-06-27 DIAGNOSIS — Z23 Encounter for immunization: Secondary | ICD-10-CM

## 2019-06-27 DIAGNOSIS — E079 Disorder of thyroid, unspecified: Secondary | ICD-10-CM

## 2019-06-27 MED ORDER — POTASSIUM CHLORIDE ER 10 MEQ PO CPCR: 10.0000 meq | ORAL_CAPSULE | Freq: Two times a day (BID) | ORAL | 0 refills | Status: DC | PRN

## 2019-06-27 MED ORDER — LEVOTHYROXINE SODIUM 100 MCG PO TABS: 100.0000 ug | ORAL_TABLET | Freq: Every day | ORAL | 1 refills | Status: DC

## 2019-06-27 MED ORDER — OLMESARTAN MEDOXOMIL 20 MG PO TABS: 20.0000 mg | ORAL_TABLET | Freq: Every day | ORAL | 1 refills | Status: DC

## 2019-06-27 MED ORDER — FUROSEMIDE 20 MG PO TABS: 20.0000 mg | ORAL_TABLET | Freq: Every day | ORAL | 0 refills | Status: DC | PRN

## 2019-06-27 NOTE — Progress Notes (Signed)
10/8/20203:18 PM  Anna Bernard 1950-12-28, 68 y.o., female [id number]  Chief Complaint  Patient presents with  . Follow-up    chronic condition, having swelling in both feet    HPI:   Patient is a 68 y.o. female with past medical history significant for dizziness,HTN, HLP,diabetes,microcystic anemia,vitamin B12 deficiencyandhypothyroidism who presents today for routine followup  Last OV Jan 2020 Patient having swelling of right ankle for past several months, saw foot doctor, MRI showed OA, started meloxicam Has been however having swelling of both legs Weight gain from depression when she quit her job, has adjusted, now happy with being retired Librarian, academic up with legs less swollen but normal Not checking blood glucose Continues to use walker  Depression screen [facility] 2/9 06/27/2019 10/05/2018 08/24/2018  Decreased Interest 0 0 0  Down, Depressed, Hopeless 0 0 0  PHQ - 2 Score 0 0 0    Fall Risk  06/27/2019 10/05/2018 08/24/2018 06/14/2018 04/17/2018  Falls in the past year? 0 0 0 No No  Number falls in past yr: 0 - - - -  Injury with Fall? 0 - - - -     No Known Allergies  Prior to Admission medications   Medication Sig Start Date End Date Taking? Authorizing Provider  aspirin 81 MG tablet Take 81 mg by mouth daily.   Yes [provider]  [facility] B-12 500 MCG tablet TAKE 1 TABLET (500 MCG TOTAL) BY MOUTH DAILY. 04/30/18  Yes Rutherford Guys, MD  ferrous gluconate (FERGON) 324 MG tablet TAKE 1 TABLET (324 MG TOTAL) BY MOUTH DAILY WITH BREAKFAST. 11/26/18  Yes Rutherford Guys, MD  levothyroxine (SYNTHROID) 100 MCG tablet TAKE 1 TABLET (100 MCG TOTAL) BY MOUTH DAILY BEFORE BREAKFAST. 05/22/19  Yes Rutherford Guys, MD  Multiple Vitamin (MULTIVITAMIN WITH MINERALS) TABS Take 1 tablet by mouth daily.   Yes [provider]  olmesartan (BENICAR) 20 MG tablet TAKE 1 TABLET BY MOUTH EVERY DAY 06/24/19  Yes Rutherford Guys, MD  rosuvastatin (CRESTOR) 10 MG tablet Take 1  tablet (10 mg total) by mouth daily. 11/16/13  Yes Roselee Culver, MD  trimethoprim (TRIMPEX) 100 MG tablet  10/04/18  Yes [provider]  meloxicam (MOBIC) 7.5 MG tablet TAKE 1 TABLET BY MOUTH EVERY DAY Patient not taking: Reported on 06/27/2019 01/07/19   Landis Martins, DPM    Past Medical History:  Diagnosis Date  . Anemia, unspecified   . Hyperlipidemia, unspecified   . Hypertension   . Hypothyroidism, unspecified   . Pre-diabetes   . Vitamin B12 deficiency     Past Surgical History:  Procedure Laterality Date  . ABDOMINAL HYSTERECTOMY    . GIVENS CAPSULE STUDY N/A 04/16/2018   Procedure: GIVENS CAPSULE STUDY;  Surgeon: Carol Ada, MD;  Location: [place];  Service: Endoscopy;  Laterality: N/A;  . SHOULDER SURGERY      Social History   Tobacco Use  . Smoking status: Never Smoker  . Smokeless tobacco: Never Used  Substance Use Topics  . Alcohol use: No    Alcohol/week: 0.0 standard drinks    History reviewed. No pertinent family history.  Review of Systems  Constitutional: Negative for chills and fever.  Respiratory: Negative for cough, shortness of breath and wheezing.   Cardiovascular: Positive for leg swelling. Negative for chest pain, palpitations, orthopnea and PND.  Gastrointestinal: Negative for abdominal pain, nausea and vomiting.  Neurological: Negative for dizziness and headaches.     OBJECTIVE:  Today's Vitals  06/27/19 1505  BP: 122/86  Pulse: 100  Temp: 98.3 F (36.8 C)  SpO2: 96%  Weight: 234 lb (106.1 kg)  Height: 5' 1"  (1.549 m)   Body mass index is 44.21 kg/m.  Wt Readings from Last 3 Encounters:  06/27/19 234 lb (106.1 kg)  10/05/18 215 lb (97.5 kg)  08/24/18 212 lb 3.2 oz (96.3 kg)    Physical Exam Vitals signs and nursing note reviewed.  Constitutional:      Appearance: She is well-developed.  HENT:     Head: Normocephalic and atraumatic.     Mouth/Throat:     Pharynx: No oropharyngeal exudate.  Eyes:      General: No scleral icterus.    Conjunctiva/sclera: Conjunctivae normal.     Pupils: Pupils are equal, round, and reactive to light.  Neck:     Musculoskeletal: Neck supple.  Cardiovascular:     Rate and Rhythm: Normal rate and regular rhythm.     Heart sounds: Normal heart sounds. No murmur. No friction rub. No gallop.   Pulmonary:     Effort: Pulmonary effort is normal.     Breath sounds: Normal breath sounds. No wheezing or rales.  Musculoskeletal:     Right lower leg: Edema present.     Left lower leg: Edema present.     Comments: Pitting edema, +1 to knee  Lymphadenopathy:     Cervical: No cervical adenopathy.  Skin:    General: Skin is warm and dry.  Neurological:     Mental Status: She is alert and oriented to person, place, and time.     No results found for this or any previous visit (from the past 24 hour(s)).  No results found.   ASSESSMENT and PLAN  1. Controlled type 2 diabetes mellitus without complication, without long-term current use of insulin ([place]) Checking labs today, medications will be adjusted as needed. Discussed LFM - TSH - Lipid panel - CMP14+EGFR - Hemoglobin A1c  2. Essential hypertension Controlled. Continue current regime. Monitor BP while on lasix, ok to decrease benicar if needed. - TSH - Lipid panel - CMP14+EGFR  3. Hypothyroidism, unspecified type Checking labs today, medications will be adjusted as needed.  - TSH - Lipid panel - CMP14+EGFR  4. Pure hypercholesterolemia Checking labs today, medications will be adjusted as needed.  - TSH - Lipid panel - CMP14+EGFR  5. Need for prophylactic vaccination and inoculation against influenza - Flu Vaccine QUAD High Dose(Fluad)  6. Peripheral edema Start lasix/kcl x 5 days. Start compression stockings, elevate. Discussed being active, weight loss  7. Need for vaccination - Pneumococcal conjugate vaccine 13-valent IM  Other orders - levothyroxine (SYNTHROID) 100 MCG  tablet; Take 1 tablet (100 mcg total) by mouth daily before breakfast. - olmesartan (BENICAR) 20 MG tablet; Take 1 tablet (20 mg total) by mouth daily. - furosemide (LASIX) 20 MG tablet; Take 1 tablet (20 mg total) by mouth daily as needed for edema. - potassium chloride (MICRO-K) 10 MEQ CR capsule; Take 1 capsule (10 mEq total) by mouth 2 (two) times daily as needed (take onlyy on day you take furosemide).  Return in about 4 weeks (around 07/25/2019).    Rutherford Guys, MD Primary Care at [facility] [place], [place] [address] Ph.  [phone] Fax [phone]

## 2019-06-27 NOTE — Telephone Encounter (Signed)
Forwarding medication refill request to the clinical pool for review. 

## 2019-06-27 NOTE — Patient Instructions (Addendum)
  Start lasix with potassium for 5 days, from there on use prn for edema Be mindful of blood pressure, if going low, decrease olmesartan (bencar) to 1/2 tab daily Once edema resolved, start wearing compression stockings Work on weight loss (diet and exercise)   If you have lab work done today you will be contacted with your lab results within the next 2 weeks.  If you have not heard from [place] then please contact us. The fastest way to get your results is to register for My Chart.   IF you received an x-ray today, you will receive an invoice from [facility]. Please contact [facility] at [phone] with questions or concerns regarding your invoice.   IF you received labwork today, you will receive an invoice from [place]. Please contact [facility] at [phone] with questions or concerns regarding your invoice.   Our billing staff will not be able to assist you with questions regarding bills from these companies.  You will be contacted with the lab results as soon as they are available. The fastest way to get your results is to activate your My Chart account. Instructions are located on the last page of this paperwork. If you have not heard from [place] regarding the results in 2 weeks, please contact this office.

## 2019-06-28 LAB — CMP14+EGFR
ALT: 31 IU/L (ref 0–32)
AST: 24 IU/L (ref 0–40)
Albumin/Globulin Ratio: 1.4 (ref 1.2–2.2)
Albumin: 4.2 g/dL (ref 3.8–4.8)
Alkaline Phosphatase: 88 IU/L (ref 39–117)
BUN/Creatinine Ratio: 22 (ref 12–28)
BUN: 21 mg/dL (ref 8–27)
Bilirubin Total: <0.2 mg/dL (ref 0.0–1.2)
CO2: 26 mmol/L (ref 20–29)
Calcium: 8.9 mg/dL (ref 8.7–10.3)
Chloride: 105 mmol/L (ref 96–106)
Creatinine, Ser: 0.95 mg/dL (ref 0.57–1.00)
GFR calc Af Amer: 72 mL/min/{1.73_m2} (ref >59)
GFR calc non Af Amer: 63 mL/min/{1.73_m2} (ref >59)
Globulin, Total: 3.1 g/dL (ref 1.5–4.5)
Glucose: 119 mg/dL — ABNORMAL HIGH (ref 65–99)
Potassium: 4.4 mmol/L (ref 3.5–5.2)
Sodium: 144 mmol/L (ref 134–144)
Total Protein: 7.3 g/dL (ref 6.0–8.5)

## 2019-06-28 LAB — LIPID PANEL
Chol/HDL Ratio: 3.1 ratio (ref 0.0–4.4)
Cholesterol, Total: 165 mg/dL (ref 100–199)
HDL: 53 mg/dL (ref >39)
LDL Chol Calc (NIH): 83 mg/dL (ref 0–99)
Triglycerides: 168 mg/dL — ABNORMAL HIGH (ref 0–149)
VLDL Cholesterol Cal: 29 mg/dL (ref 5–40)

## 2019-06-28 LAB — TSH: TSH: 9.47 u[IU]/mL — ABNORMAL HIGH (ref 0.450–4.500)

## 2019-06-28 LAB — HEMOGLOBIN A1C
Est. average glucose Bld gHb Est-mCnc: 131 mg/dL
Hgb A1c MFr Bld: 6.2 % — ABNORMAL HIGH (ref 4.8–5.6)

## 2019-07-08 ENCOUNTER — Other Ambulatory Visit: Payer: Self-pay | Admitting: Family Medicine

## 2019-07-08 DIAGNOSIS — E039 Hypothyroidism, unspecified: Secondary | ICD-10-CM

## 2019-07-08 MED ORDER — LEVOTHYROXINE SODIUM 125 MCG PO TABS: 125.0000 ug | ORAL_TABLET | Freq: Every day | ORAL | 0 refills | Status: DC

## 2019-07-23 ENCOUNTER — Other Ambulatory Visit: Payer: Self-pay | Admitting: Family Medicine

## 2019-07-25 ENCOUNTER — Encounter: Payer: Self-pay | Admitting: Family Medicine

## 2019-07-25 ENCOUNTER — Other Ambulatory Visit: Payer: Self-pay

## 2019-07-25 ENCOUNTER — Ambulatory Visit (INDEPENDENT_AMBULATORY_CARE_PROVIDER_SITE_OTHER): Payer: Medicare Other | Admitting: Family Medicine

## 2019-07-25 VITALS — BP 127/79 | HR 94 | Temp 98.6°F | Resp 12 | Ht 61.0 in | Wt 234.0 lb

## 2019-07-25 DIAGNOSIS — R011 Cardiac murmur, unspecified: Secondary | ICD-10-CM

## 2019-07-25 DIAGNOSIS — R600 Localized edema: Secondary | ICD-10-CM | POA: Diagnosis not present

## 2019-07-25 NOTE — Progress Notes (Signed)
11/5/202011:45 AM  Anna Bernard 10/25/1950, 68 y.o., female [id number]  Chief Complaint  Patient presents with  . Foot Problem    Pt stated both swelling, sharp pain, tingling    HPI:   Patient is a 68 y.o. female with past medical history significant for dizziness,HTN, HLP,diabetes,microcystic anemia,vitamin B12 deficiencyandhypothyroidismwho presents today for followup on leg edema  Last OV oct 2020 - short course of lasix and compression stockings States that despite good diuresis no improvement in edema with lasix Has been tolerating compression stockings and elevating - helps Denies any cough, SOB, chest pain, palpitations, orthopnea or PND  Depression screen [facility] 2/9 07/25/2019 06/27/2019 10/05/2018  Decreased Interest 0 0 0  Down, Depressed, Hopeless 0 0 0  PHQ - 2 Score 0 0 0    Fall Risk  07/25/2019 06/27/2019 10/05/2018 08/24/2018 06/14/2018  Falls in the past year? 1 0 0 0 No  Number falls in past yr: 1 0 - - -  Injury with Fall? 1 0 - - -     No Known Allergies  Prior to Admission medications   Medication Sig Start Date End Date Taking? Authorizing Provider  aspirin 81 MG tablet Take 81 mg by mouth daily.   Yes [provider]  [facility] B-12 500 MCG tablet TAKE 1 TABLET (500 MCG TOTAL) BY MOUTH DAILY. 04/30/18  Yes Rutherford Guys, MD  levothyroxine (SYNTHROID) 125 MCG tablet Take 1 tablet (125 mcg total) by mouth daily before breakfast. 07/08/19  Yes Rutherford Guys, MD  Multiple Vitamin (MULTIVITAMIN WITH MINERALS) TABS Take 1 tablet by mouth daily.   Yes [provider]  olmesartan (BENICAR) 20 MG tablet Take 1 tablet (20 mg total) by mouth daily. 06/27/19  Yes Rutherford Guys, MD  rosuvastatin (CRESTOR) 10 MG tablet Take 1 tablet (10 mg total) by mouth daily. 11/16/13  Yes Roselee Culver, MD  trimethoprim (TRIMPEX) 100 MG tablet  10/04/18  Yes [provider]  furosemide (LASIX) 20 MG tablet TAKE 1 TABLET BY MOUTH DAILY AS NEEDED  FOR EDEMA. Patient not taking: Reported on 07/25/2019 07/23/19   Rutherford Guys, MD  meloxicam (MOBIC) 7.5 MG tablet TAKE 1 TABLET BY MOUTH EVERY DAY Patient not taking: Reported on 07/25/2019 01/07/19   Landis Martins, DPM  potassium chloride (MICRO-K) 10 MEQ CR capsule TAKE 1 CAPSULE BY MOUTH 2 TIMES DAILY AS NEEDED (TAKE ONLY ON DAY YOU TAKE FUROSEMIDE). Patient not taking: Reported on 07/25/2019 07/23/19   Rutherford Guys, MD    Past Medical History:  Diagnosis Date  . Anemia, unspecified   . Hyperlipidemia, unspecified   . Hypertension   . Hypothyroidism, unspecified   . Pre-diabetes   . Vitamin B12 deficiency     Past Surgical History:  Procedure Laterality Date  . ABDOMINAL HYSTERECTOMY    . GIVENS CAPSULE STUDY N/A 04/16/2018   Procedure: GIVENS CAPSULE STUDY;  Surgeon: Carol Ada, MD;  Location: [place];  Service: Endoscopy;  Laterality: N/A;  . SHOULDER SURGERY      Social History   Tobacco Use  . Smoking status: Never Smoker  . Smokeless tobacco: Never Used  Substance Use Topics  . Alcohol use: No    Alcohol/week: 0.0 standard drinks    History reviewed. No pertinent family history.  ROS Per hpi  OBJECTIVE:  Today's Vitals   07/25/19 1136  BP: 127/79  Pulse: 94  Resp: 12  Temp: 98.6 F (37 C)  SpO2: 95%  Weight: 234  lb (106.1 kg)  Height: 5\' 1"  (1.549 m)   Body mass index is 44.21 kg/m.  Wt Readings from Last 3 Encounters:  07/25/19 234 lb (106.1 kg)  06/27/19 234 lb (106.1 kg)  10/05/18 215 lb (97.5 kg)    Physical Exam Vitals signs and nursing note reviewed.  Constitutional:      Appearance: She is well-developed.  HENT:     Head: Normocephalic and atraumatic.     Mouth/Throat:     Pharynx: No oropharyngeal exudate.  Eyes:     General: No scleral icterus.    Conjunctiva/sclera: Conjunctivae normal.     Pupils: Pupils are equal, round, and reactive to light.  Neck:     Musculoskeletal: Neck supple.  Cardiovascular:      Rate and Rhythm: Normal rate and regular rhythm.     Heart sounds: Murmur (new murmur, LUSB, systolic) present. No friction rub. No gallop.   Pulmonary:     Effort: Pulmonary effort is normal.     Breath sounds: Normal breath sounds. No wheezing or rales.  Musculoskeletal:     Right lower leg: Edema present.     Left lower leg: Edema present.     Comments: Pitting +2 edema  Skin:    General: Skin is warm and dry.  Neurological:     Mental Status: She is alert and oriented to person, place, and time.     No results found for this or any previous visit (from the past 24 hour(s)).  No results found.  My interpretation of EKG:  NSR, HR 82, normal p waves, no st changes  ASSESSMENT and PLAN  1. Heart murmur New heart murmur in setting of edema, echo to further evaluate source and PA pressures - ECHOCARDIOGRAM COMPLETE; Future - EKG 12-Lead  2. Bilateral lower extremity edema Worse. Did not respond to low dose lasix, concern for hypotension as has had extensively in the past, withhold titration for now as no clear cause. Continue with compression stockings and elevation. - ECHOCARDIOGRAM COMPLETE; Future  Return in about 4 weeks (around 08/22/2019).    14/11/2018, MD Primary Care at [facility] [address] [place], [place] Ph.  [phone] Fax [phone]

## 2019-07-30 ENCOUNTER — Other Ambulatory Visit: Payer: Self-pay | Admitting: Family Medicine

## 2019-07-30 DIAGNOSIS — E079 Disorder of thyroid, unspecified: Secondary | ICD-10-CM

## 2019-07-30 NOTE — Telephone Encounter (Signed)
Requested medication (s) are due for refill today: no  Requested medication (s) are on the active medication list: no  Last refill:  05/22/2019  Future visit scheduled:yes  Notes to clinic: medication was discontinued    Requested Prescriptions  Pending Prescriptions Disp Refills   levothyroxine (SYNTHROID) 100 MCG tablet [Pharmacy Med Name: LEVOTHYROXINE 100 MCG TABLET] 30 tablet 0    Sig: TAKE 1 TABLET (100 MCG TOTAL) BY MOUTH DAILY BEFORE BREAKFAST.     Endocrinology:  Hypothyroid Agents Failed - 07/30/2019 12:44 PM      Failed - TSH needs to be rechecked within 3 months after an abnormal result. Refill until TSH is due.      Failed - TSH in normal range and within 360 days    TSH  Date Value Ref Range Status  06/27/2019 9.470 (H) 0.450 - 4.500 uIU/mL Final         Passed - Valid encounter within last 12 months    Recent Outpatient Visits          5 days ago Heart murmur   Primary Care at [facility], Lilia Argue, MD   1 month ago Controlled type 2 diabetes mellitus without complication, without long-term current use of insulin [facility])   [facility] at [facility], Lilia Argue, MD   9 months ago Acute left-sided low back pain without sciatica   [facility] Care at [facility], Lilia Argue, MD   11 months ago Controlled type 2 diabetes mellitus without complication, without long-term current use of insulin [facility])   Primary Care at [facility], Lilia Argue, MD   1 year ago Orthostatic hypotension   Primary Care at [facility], Lilia Argue, MD      Future Appointments            In 3 weeks Rutherford Guys, MD Primary Care at [place], [facility]

## 2019-08-05 ENCOUNTER — Ambulatory Visit (HOSPITAL_COMMUNITY): Payer: Medicare Other | Attending: Cardiology

## 2019-08-05 ENCOUNTER — Other Ambulatory Visit: Payer: Self-pay

## 2019-08-05 DIAGNOSIS — R600 Localized edema: Secondary | ICD-10-CM

## 2019-08-05 DIAGNOSIS — R011 Cardiac murmur, unspecified: Secondary | ICD-10-CM | POA: Insufficient documentation

## 2019-08-09 DIAGNOSIS — Z1231 Encounter for screening mammogram for malignant neoplasm of breast: Secondary | ICD-10-CM | POA: Diagnosis not present

## 2019-08-09 LAB — HM MAMMOGRAPHY

## 2019-08-12 ENCOUNTER — Telehealth: Payer: Self-pay | Admitting: Family Medicine

## 2019-08-12 NOTE — Telephone Encounter (Signed)
Copied from [place] [id number]. Topic: Appointment Scheduling - Scheduling Inquiry for Clinic >> Aug 12, 2019 12:21 PM Richardo Priest, [place] wrote: Reason for CRM: Pt called in stating she is needing to have a medication refill. Script keeps getting denied. Pt needing an appointment to have filled. Pt states it is a potassium medication. Please advise.

## 2019-08-13 NOTE — Telephone Encounter (Signed)
Please advice! Patient states this meds keep being denied

## 2019-08-22 ENCOUNTER — Other Ambulatory Visit: Payer: Self-pay

## 2019-08-22 ENCOUNTER — Ambulatory Visit (INDEPENDENT_AMBULATORY_CARE_PROVIDER_SITE_OTHER): Payer: Medicare Other | Admitting: Family Medicine

## 2019-08-22 ENCOUNTER — Encounter: Payer: Self-pay | Admitting: Family Medicine

## 2019-08-22 VITALS — BP 122/75 | HR 76 | Temp 98.4°F | Resp 12 | Wt 229.0 lb

## 2019-08-22 DIAGNOSIS — E039 Hypothyroidism, unspecified: Secondary | ICD-10-CM | POA: Diagnosis not present

## 2019-08-22 DIAGNOSIS — R600 Localized edema: Secondary | ICD-10-CM | POA: Diagnosis not present

## 2019-08-22 MED ORDER — FUROSEMIDE 20 MG PO TABS: 20.0000 mg | ORAL_TABLET | Freq: Two times a day (BID) | ORAL | 3 refills | Status: DC

## 2019-08-22 MED ORDER — POTASSIUM CHLORIDE CRYS ER 20 MEQ PO TBCR: 20.0000 meq | EXTENDED_RELEASE_TABLET | Freq: Two times a day (BID) | ORAL | 3 refills | Status: DC

## 2019-08-22 NOTE — Patient Instructions (Addendum)
  Please make sure that you are taking levothyroxine 125 mcg a day (dose changed in oct)  Start lasix 20mg  with potassium once a day, if no improvement after 5 days then increase lasix and potassium to twice a day.   Come in 2-3 weeks for recheck of leg edema.   Lets do labs 2-3 days prior: need to check BMP and TSH.     If you have lab work done today you will be contacted with your lab results within the next 2 weeks.  If you have not heard from [place] then please contact us. The fastest way to get your results is to register for My Chart.   IF you received an x-ray today, you will receive an invoice from [facility]. Please contact [facility] at [phone] with questions or concerns regarding your invoice.   IF you received labwork today, you will receive an invoice from [facility]. Please contact [facility] at [phone] with questions or concerns regarding your invoice.   Our billing staff will not be able to assist you with questions regarding bills from these companies.  You will be contacted with the lab results as soon as they are available. The fastest way to get your results is to activate your My Chart account. Instructions are located on the last page of this paperwork. If you have not heard from [place] regarding the results in 2 weeks, please contact this office.

## 2019-08-22 NOTE — Progress Notes (Signed)
12/3/202011:55 AM  Anna Bernard April 19, 1951, 68 y.o., female [id number]  Chief Complaint  Patient presents with  . Follow-up    Pt stated --right foot worse than left foot--still painful and swollen.    HPI:   Patient is a 68 y.o. female with past medical history significant for dizziness,HTN, HLP,diabetes,microcystic anemia,vitamin B12 deficiencyandhypothyroidismwho presents today forfollowup on leg edema  Last OV nov 2020 - echo ordered  Reports leg edema is worse Has been wo lasix for past week or so Has been wearing compression stockings and elevating Lasix 20mg  once a day did not make sign changes to edema  Not sure if she is taking new levothyroxine dose  Lab Results  Component Value Date   CREATININE 0.95 06/27/2019   BUN 21 06/27/2019   NA 144 06/27/2019   K 4.4 06/27/2019   CL 105 06/27/2019   CO2 26 06/27/2019   Lab Results  Component Value Date   TSH 9.470 (H) 06/27/2019    Echo: 1. Left ventricular ejection fraction, by visual estimation, is 60 to 65%. The left ventricle has normal function. Moderately increased left ventricular posterior wall thickness. There is moderately increased left ventricular hypertrophy of the basal  septum.  2. Left ventricular diastolic parameters are consistent with Grade I diastolic dysfunction (impaired relaxation).  3. Global right ventricle has normal systolic function.The right ventricular size is normal. No increase in right ventricular wall thickness.  4. Left atrial size was mildly dilated.  5. Right atrial size was normal.  6. Moderate mitral annular calcification.  7. The mitral valve is normal in structure. No evidence of mitral valve regurgitation. No evidence of mitral stenosis.  8. The tricuspid valve is normal in structure. Tricuspid valve regurgitation is not demonstrated.  9. The aortic valve is normal in structure. Aortic valve regurgitation is not visualized. No evidence of aortic valve sclerosis  or stenosis. 10. The pulmonic valve was normal in structure. Pulmonic valve regurgitation is not visualized. 11. The inferior vena cava is normal in size with greater than 50% respiratory variability, suggesting right atrial pressure of 3 mmHg.   Depression screen [facility] 2/9 07/25/2019 06/27/2019 10/05/2018  Decreased Interest 0 0 0  Down, Depressed, Hopeless 0 0 0  PHQ - 2 Score 0 0 0    Fall Risk  07/25/2019 06/27/2019 10/05/2018 08/24/2018 06/14/2018  Falls in the past year? 1 0 0 0 No  Number falls in past yr: 1 0 - - -  Injury with Fall? 1 0 - - -     No Known Allergies  Prior to Admission medications   Medication Sig Start Date End Date Taking? Authorizing Provider  aspirin 81 MG tablet Take 81 mg by mouth daily.   Yes [provider]  [facility] B-12 500 MCG tablet TAKE 1 TABLET (500 MCG TOTAL) BY MOUTH DAILY. 04/30/18  Yes 06/30/18, MD  levothyroxine (SYNTHROID) 125 MCG tablet Take 1 tablet (125 mcg total) by mouth daily before breakfast. 07/08/19  Yes 07/10/19, MD  Multiple Vitamin (MULTIVITAMIN WITH MINERALS) TABS Take 1 tablet by mouth daily.   Yes [provider]  olmesartan (BENICAR) 20 MG tablet Take 1 tablet (20 mg total) by mouth daily. 06/27/19  Yes 08/27/19, MD  rosuvastatin (CRESTOR) 10 MG tablet Take 1 tablet (10 mg total) by mouth daily. 11/16/13  Yes 11/18/13, MD  trimethoprim (TRIMPEX) 100 MG tablet  10/04/18  Yes [provider]    Past Medical  History:  Diagnosis Date  . Anemia, unspecified   . Hyperlipidemia, unspecified   . Hypertension   . Hypothyroidism, unspecified   . Pre-diabetes   . Vitamin B12 deficiency     Past Surgical History:  Procedure Laterality Date  . ABDOMINAL HYSTERECTOMY    . GIVENS CAPSULE STUDY N/A 04/16/2018   Procedure: GIVENS CAPSULE STUDY;  Surgeon: Carol Ada, MD;  Location: [place];  Service: Endoscopy;  Laterality: N/A;  . SHOULDER SURGERY      Social History    Tobacco Use  . Smoking status: Never Smoker  . Smokeless tobacco: Never Used  Substance Use Topics  . Alcohol use: No    Alcohol/week: 0.0 standard drinks    No family history on file.  Review of Systems  Constitutional: Negative for chills and fever.  Respiratory: Negative for cough and shortness of breath.   Cardiovascular: Positive for leg swelling. Negative for chest pain, palpitations, orthopnea and PND.  Gastrointestinal: Negative for abdominal pain, nausea and vomiting.  Neurological: Negative for dizziness.     OBJECTIVE:  Today's Vitals   08/22/19 1147  BP: 122/75  Pulse: 76  Resp: 12  Temp: 98.4 F (36.9 C)  SpO2: 95%  Weight: 229 lb (103.9 kg)   Body mass index is 43.27 kg/m.  Wt Readings from Last 3 Encounters:  08/22/19 229 lb (103.9 kg)  07/25/19 234 lb (106.1 kg)  06/27/19 234 lb (106.1 kg)    Physical Exam Vitals signs and nursing note reviewed.  Constitutional:      Appearance: She is well-developed.  HENT:     Head: Normocephalic and atraumatic.     Mouth/Throat:     Pharynx: No oropharyngeal exudate.  Eyes:     General: No scleral icterus.    Conjunctiva/sclera: Conjunctivae normal.     Pupils: Pupils are equal, round, and reactive to light.  Neck:     Musculoskeletal: Neck supple.  Cardiovascular:     Rate and Rhythm: Normal rate and regular rhythm.     Heart sounds: Normal heart sounds. No murmur. No friction rub. No gallop.   Pulmonary:     Effort: Pulmonary effort is normal.     Breath sounds: Normal breath sounds. No wheezing or rales.  Musculoskeletal:     Right lower leg: Edema (+ 3 pitting edema) present.     Left lower leg: Edema present.  Lymphadenopathy:     Cervical: No cervical adenopathy.  Skin:    General: Skin is warm and dry.  Neurological:     Mental Status: She is alert and oriented to person, place, and time.     No results found for this or any previous visit (from the past 24 hour(s)).  No results  found.   ASSESSMENT and PLAN  1. Bilateral lower extremity edema DD1, restart lasix/kcl. Reviewed r/se/b. Cont compression stockings and elevation. Discussed salt and fluid intakes. Monitor BP, discussed mgt if hypotension. - Basic Metabolic Panel; Future  2. Hypothyroidism, unspecified type Patient to check dose of current med. Recheck TSH.   Other orders - furosemide (LASIX) 20 MG tablet; Take 1 tablet (20 mg total) by mouth 2 (two) times daily. - potassium chloride SA (KLOR-CON) 20 MEQ tablet; Take 1 tablet (20 mEq total) by mouth 2 (two) times daily.  Return for 2-3 weeks for leg edema, labs 2-3 days prior.    Rutherford Guys, MD Primary Care at [place], [place] [address] Ph.  [phone] Fax [phone]

## 2019-08-23 ENCOUNTER — Other Ambulatory Visit: Payer: Self-pay | Admitting: Family Medicine

## 2019-08-23 DIAGNOSIS — E079 Disorder of thyroid, unspecified: Secondary | ICD-10-CM

## 2019-08-23 DIAGNOSIS — E039 Hypothyroidism, unspecified: Secondary | ICD-10-CM

## 2019-08-23 NOTE — Telephone Encounter (Signed)
Requested medication (s) are due for refill today: no  Requested medication (s) are on the active medication list: no  Last refill:  05/22/2019  Future visit scheduled: yes  Notes to clinic:  Medication was discontinued    Requested Prescriptions  Pending Prescriptions Disp Refills   levothyroxine (SYNTHROID) 100 MCG tablet [Pharmacy Med Name: LEVOTHYROXINE 100 MCG TABLET] 30 tablet 0    Sig: TAKE 1 TABLET (100 MCG TOTAL) BY MOUTH DAILY BEFORE BREAKFAST.     Endocrinology:  Hypothyroid Agents Failed - 08/23/2019  9:03 AM      Failed - TSH needs to be rechecked within 3 months after an abnormal result. Refill until TSH is due.      Failed - TSH in normal range and within 360 days    TSH  Date Value Ref Range Status  06/27/2019 9.470 (H) 0.450 - 4.500 uIU/mL Final         Passed - Valid encounter within last 12 months    Recent Outpatient Visits          Yesterday Bilateral lower extremity edema   [facility] at [facility], Lilia Argue, MD   4 weeks ago Heart murmur   [facility], Lilia Argue, MD   1 month ago Controlled type 2 diabetes mellitus without complication, without long-term current use of insulin [facility])   [facility] at [facility], Lilia Argue, MD   10 months ago Acute left-sided low back pain without sciatica   [facility], Lilia Argue, MD   12 months ago Controlled type 2 diabetes mellitus without complication, without long-term current use of insulin [facility])   [facility] at [facility], Lilia Argue, MD      Future Appointments            In 1 week Rutherford Guys, MD Primary Care at [place], [facility]

## 2019-09-02 ENCOUNTER — Other Ambulatory Visit: Payer: Self-pay

## 2019-09-02 ENCOUNTER — Ambulatory Visit (INDEPENDENT_AMBULATORY_CARE_PROVIDER_SITE_OTHER): Payer: Medicare Other

## 2019-09-02 DIAGNOSIS — R300 Dysuria: Secondary | ICD-10-CM | POA: Diagnosis not present

## 2019-09-02 DIAGNOSIS — N390 Urinary tract infection, site not specified: Secondary | ICD-10-CM

## 2019-09-02 DIAGNOSIS — E039 Hypothyroidism, unspecified: Secondary | ICD-10-CM

## 2019-09-02 DIAGNOSIS — R600 Localized edema: Secondary | ICD-10-CM

## 2019-09-03 LAB — MICROSCOPIC EXAMINATION: Casts: NONE SEEN /lpf

## 2019-09-03 LAB — URINALYSIS, ROUTINE W REFLEX MICROSCOPIC
Bilirubin, UA: NEGATIVE
Glucose, UA: NEGATIVE
Ketones, UA: NEGATIVE
Nitrite, UA: NEGATIVE
RBC, UA: NEGATIVE
Specific Gravity, UA: 1.021 (ref 1.005–1.030)
Urobilinogen, Ur: 0.2 mg/dL (ref 0.2–1.0)
pH, UA: 5.5 (ref 5.0–7.5)

## 2019-09-03 LAB — BASIC METABOLIC PANEL
BUN/Creatinine Ratio: 19 (ref 12–28)
BUN: 17 mg/dL (ref 8–27)
CO2: 26 mmol/L (ref 20–29)
Calcium: 9.4 mg/dL (ref 8.7–10.3)
Chloride: 102 mmol/L (ref 96–106)
Creatinine, Ser: 0.88 mg/dL (ref 0.57–1.00)
GFR calc Af Amer: 79 mL/min/{1.73_m2} (ref >59)
GFR calc non Af Amer: 69 mL/min/{1.73_m2} (ref >59)
Glucose: 88 mg/dL (ref 65–99)
Potassium: 4.1 mmol/L (ref 3.5–5.2)
Sodium: 141 mmol/L (ref 134–144)

## 2019-09-03 LAB — TSH: TSH: 3.41 u[IU]/mL (ref 0.450–4.500)

## 2019-09-05 ENCOUNTER — Other Ambulatory Visit: Payer: Self-pay

## 2019-09-05 ENCOUNTER — Encounter: Payer: Self-pay | Admitting: Family Medicine

## 2019-09-05 ENCOUNTER — Ambulatory Visit (INDEPENDENT_AMBULATORY_CARE_PROVIDER_SITE_OTHER): Payer: Medicare Other | Admitting: Family Medicine

## 2019-09-05 VITALS — BP 122/76 | HR 88 | Temp 99.1°F | Resp 17 | Ht 59.5 in | Wt 228.0 lb

## 2019-09-05 DIAGNOSIS — N390 Urinary tract infection, site not specified: Secondary | ICD-10-CM

## 2019-09-05 DIAGNOSIS — R600 Localized edema: Secondary | ICD-10-CM | POA: Diagnosis not present

## 2019-09-05 DIAGNOSIS — E039 Hypothyroidism, unspecified: Secondary | ICD-10-CM

## 2019-09-05 LAB — URINE CULTURE

## 2019-09-05 NOTE — Patient Instructions (Signed)
° ° ° °  If you have lab work done today you will be contacted with your lab results within the next 2 weeks.  If you have not heard from us then please contact us. The fastest way to get your results is to register for My Chart. ° ° °IF you received an x-ray today, you will receive an invoice from [facility]. Please contact [facility] at [phone] with questions or concerns regarding your invoice.  ° °IF you received labwork today, you will receive an invoice from [facility]. Please contact [facility] at [phone] with questions or concerns regarding your invoice.  ° °Our billing staff will not be able to assist you with questions regarding bills from these companies. ° °You will be contacted with the lab results as soon as they are available. The fastest way to get your results is to activate your My Chart account. Instructions are located on the last page of this paperwork. If you have not heard from us regarding the results in 2 weeks, please contact this office. °  ° ° ° °

## 2019-09-05 NOTE — Progress Notes (Signed)
12/17/202011:45 AM  Anna Bernard 1950-10-24, 68 y.o., female [id number]  Chief Complaint  Patient presents with  . Edema    HPI:   Patient is a 68 y.o. female with past medical history significant for dizziness,HTN, HLP,diabetes,microcystic anemia,vitamin B12 deficiencyandhypothyroidismwho presents today forfollowup on leg edema  Last OV 2 weeks ago - started lasix 20mg  twice a day with KCL Edema slowly getting better Has not had to decrease olmesartan  Lab Results  Component Value Date   CREATININE 0.88 09/02/2019   BUN 17 09/02/2019   NA 141 09/02/2019   K 4.1 09/02/2019   CL 102 09/02/2019   CO2 26 09/02/2019   Lab Results  Component Value Date   TSH 3.410 09/02/2019   Urine culture - e coli, 100 000 cfu, sensitivities pending Takes trimethropin every night, denies any dysuria or hematuria. No flank pain, no nausea or vomiting, no fever or chills Sees urology, last OV Sept 2020  Depression screen [facility] 2/9 07/25/2019 06/27/2019 10/05/2018  Decreased Interest 0 0 0  Down, Depressed, Hopeless 0 0 0  PHQ - 2 Score 0 0 0    Fall Risk  07/25/2019 06/27/2019 10/05/2018 08/24/2018 06/14/2018  Falls in the past year? 1 0 0 0 No  Number falls in past yr: 1 0 - - -  Injury with Fall? 1 0 - - -     No Known Allergies  Prior to Admission medications   Medication Sig Start Date End Date Taking? Authorizing Provider  aspirin 81 MG tablet Take 81 mg by mouth daily.   Yes [provider]  [facility] B-12 500 MCG tablet TAKE 1 TABLET (500 MCG TOTAL) BY MOUTH DAILY. 04/30/18  Yes Rutherford Guys, MD  furosemide (LASIX) 20 MG tablet Take 1 tablet (20 mg total) by mouth 2 (two) times daily. 08/22/19  Yes Rutherford Guys, MD  levothyroxine (SYNTHROID) 125 MCG tablet Take 1 tablet (125 mcg total) by mouth daily before breakfast. 07/08/19  Yes Rutherford Guys, MD  levothyroxine (SYNTHROID) 125 MCG tablet Take 1 tablet (125 mcg total) by mouth daily. 08/23/19  Yes Rutherford Guys, MD  Multiple Vitamin (MULTIVITAMIN WITH MINERALS) TABS Take 1 tablet by mouth daily.   Yes [provider]  olmesartan (BENICAR) 20 MG tablet Take 1 tablet (20 mg total) by mouth daily. 06/27/19  Yes Rutherford Guys, MD  potassium chloride SA (KLOR-CON) 20 MEQ tablet Take 1 tablet (20 mEq total) by mouth 2 (two) times daily. 08/22/19  Yes Rutherford Guys, MD  rosuvastatin (CRESTOR) 10 MG tablet Take 1 tablet (10 mg total) by mouth daily. 11/16/13  Yes Roselee Culver, MD  trimethoprim (TRIMPEX) 100 MG tablet  10/04/18  Yes [provider]    Past Medical History:  Diagnosis Date  . Anemia, unspecified   . Hyperlipidemia, unspecified   . Hypertension   . Hypothyroidism, unspecified   . Pre-diabetes   . Vitamin B12 deficiency     Past Surgical History:  Procedure Laterality Date  . ABDOMINAL HYSTERECTOMY    . GIVENS CAPSULE STUDY N/A 04/16/2018   Procedure: GIVENS CAPSULE STUDY;  Surgeon: Carol Ada, MD;  Location: [place];  Service: Endoscopy;  Laterality: N/A;  . SHOULDER SURGERY      Social History   Tobacco Use  . Smoking status: Never Smoker  . Smokeless tobacco: Never Used  Substance Use Topics  . Alcohol use: No    Alcohol/week: 0.0 standard drinks  No family history on file.  Review of Systems  Constitutional: Negative for chills and fever.  Respiratory: Negative for cough and shortness of breath.   Cardiovascular: Positive for leg swelling. Negative for chest pain and palpitations.  Gastrointestinal: Negative for abdominal pain, nausea and vomiting.   Per hpi  OBJECTIVE:  Today's Vitals   09/05/19 1129  BP: 122/76  Pulse: 88  Resp: 17  Temp: 99.1 F (37.3 C)  TempSrc: Oral  SpO2: 98%  Weight: 228 lb (103.4 kg)  Height: 4' 11.5" (1.511 m)   Body mass index is 45.28 kg/m.  Wt Readings from Last 3 Encounters:  09/05/19 228 lb (103.4 kg)  08/22/19 229 lb (103.9 kg)  07/25/19 234 lb (106.1 kg)    Physical  Exam Vitals and nursing note reviewed.  Constitutional:      Appearance: She is well-developed.  HENT:     Head: Normocephalic and atraumatic.     Mouth/Throat:     Pharynx: No oropharyngeal exudate.  Eyes:     General: No scleral icterus.    Conjunctiva/sclera: Conjunctivae normal.     Pupils: Pupils are equal, round, and reactive to light.  Cardiovascular:     Rate and Rhythm: Normal rate and regular rhythm.     Heart sounds: Normal heart sounds. No murmur. No friction rub. No gallop.   Pulmonary:     Effort: Pulmonary effort is normal.     Breath sounds: Normal breath sounds. No wheezing or rales.  Musculoskeletal:     Cervical back: Neck supple.     Right lower leg: Edema present.     Left lower leg: Edema present.     Comments: Improved edema, +1-2 pitting edema upto knees, R worse than Left  Skin:    General: Skin is warm and dry.  Neurological:     Mental Status: She is alert and oriented to person, place, and time.     No results found for this or any previous visit (from the past 24 hour(s)).  No results found.   ASSESSMENT and PLAN  1. Bilateral lower extremity edema Slowly improving. BP stable. Cont wit current regime, incl compression stockings, elevation and low salt diet.  2. Hypothyroidism, unspecified type TSH at goal. Continue current regime.   3. Recurrent UTI Recent urcx positive but clinically no symptoms of active infection. On daily suppressive abx per urology. RTC precautions reviewed  Return in about 4 weeks (around 10/03/2019).    Myles Lipps, MD Primary Care at [facility] [address] [place], [place] [address] Ph.  [phone] Fax [phone]

## 2019-09-22 ENCOUNTER — Other Ambulatory Visit: Payer: Self-pay | Admitting: Family Medicine

## 2019-10-03 ENCOUNTER — Encounter: Payer: Self-pay | Admitting: Family Medicine

## 2019-10-03 ENCOUNTER — Other Ambulatory Visit: Payer: Self-pay

## 2019-10-03 ENCOUNTER — Ambulatory Visit (INDEPENDENT_AMBULATORY_CARE_PROVIDER_SITE_OTHER): Payer: Medicare Other | Admitting: Family Medicine

## 2019-10-03 VITALS — BP 132/64 | HR 94 | Temp 98.5°F | Wt 230.0 lb

## 2019-10-03 DIAGNOSIS — Z862 Personal history of diseases of the blood and blood-forming organs and certain disorders involving the immune mechanism: Secondary | ICD-10-CM

## 2019-10-03 DIAGNOSIS — E538 Deficiency of other specified B group vitamins: Secondary | ICD-10-CM

## 2019-10-03 DIAGNOSIS — R600 Localized edema: Secondary | ICD-10-CM | POA: Diagnosis not present

## 2019-10-03 NOTE — Patient Instructions (Signed)
Takes lasix twice a day Cut olmesartan to 1/2 tab daily Check your BP Wear compression stockings Elevated legs when sitting Walk more Cut back salt

## 2019-10-03 NOTE — Progress Notes (Signed)
1/14/202112:04 PM  Anna QUEBEDEAUX [birth year], 69 y.o., female [id number]  Chief Complaint  Patient presents with  . Wound Check    not taking the lasix bid, still having swelling in right foot. Noticing leaking from that foot. Makes her dizzy. Wants to know why she was taken off iron    HPI:   Patient is a 69 y.o. female with past medical history significant for dizziness,HTN, HLP,diabetes,microcystic anemia,vitamin B12 deficiencyandhypothyroidismwho presents today forfollowup on leg edema  Last OV a month ago - lasix, compression stockings Taking 20mg  once a day Did not tolerate 20mg  BID, was getting too dizzy, did not adjust olmesartan as discussed Has not been using compression stockings Remains mostly sedentary Echo in nov 2020 - unremarkable   Lab Results  Component Value Date   CREATININE 0.88 09/02/2019   BUN 17 09/02/2019   NA 141 09/02/2019   K 4.1 09/02/2019   CL 102 09/02/2019   CO2 26 09/02/2019    Depression screen [facility] 2/9 10/03/2019 07/25/2019 06/27/2019  Decreased Interest 0 0 0  Down, Depressed, Hopeless 0 0 0  PHQ - 2 Score 0 0 0    Fall Risk  10/03/2019 07/25/2019 06/27/2019 10/05/2018 08/24/2018  Falls in the past year? 0 1 0 0 0  Number falls in past yr: 0 1 0 - -  Injury with Fall? 0 1 0 - -  Risk for fall due to : Medication side effect;Impaired mobility;Impaired balance/gait - - - -     No Known Allergies  Prior to Admission medications   Medication Sig Start Date End Date Taking? Authorizing Provider  aspirin 81 MG tablet Take 81 mg by mouth daily.   Yes [provider]  [facility] B-12 500 MCG tablet TAKE 1 TABLET (500 MCG TOTAL) BY MOUTH DAILY. 04/30/18  Yes Rutherford Guys, MD  levothyroxine (SYNTHROID) 125 MCG tablet Take 1 tablet (125 mcg total) by mouth daily. 08/23/19  Yes Rutherford Guys, MD  Multiple Vitamin (MULTIVITAMIN WITH MINERALS) TABS Take 1 tablet by mouth daily.   Yes [provider]  olmesartan (BENICAR) 20  MG tablet TAKE 1 TABLET BY MOUTH EVERY DAY 09/23/19  Yes Rutherford Guys, MD  potassium chloride SA (KLOR-CON) 20 MEQ tablet Take 1 tablet (20 mEq total) by mouth 2 (two) times daily. 08/22/19  Yes Rutherford Guys, MD  rosuvastatin (CRESTOR) 10 MG tablet Take 1 tablet (10 mg total) by mouth daily. 11/16/13  Yes Roselee Culver, MD  trimethoprim (TRIMPEX) 100 MG tablet  10/04/18  Yes [provider]  furosemide (LASIX) 20 MG tablet Take 1 tablet (20 mg total) by mouth 2 (two) times daily. Patient not taking: Reported on 10/03/2019 08/22/19   Rutherford Guys, MD    Past Medical History:  Diagnosis Date  . Anemia, unspecified   . Hyperlipidemia, unspecified   . Hypertension   . Hypothyroidism, unspecified   . Pre-diabetes   . Vitamin B12 deficiency     Past Surgical History:  Procedure Laterality Date  . ABDOMINAL HYSTERECTOMY    . GIVENS CAPSULE STUDY N/A 04/16/2018   Procedure: GIVENS CAPSULE STUDY;  Surgeon: Carol Ada, MD;  Location: [place];  Service: Endoscopy;  Laterality: N/A;  . SHOULDER SURGERY      Social History   Tobacco Use  . Smoking status: Never Smoker  . Smokeless tobacco: Never Used  Substance Use Topics  . Alcohol use: No    Alcohol/week: 0.0 standard drinks  History reviewed. No pertinent family history.  Review of Systems  Constitutional: Negative for chills and fever.  Respiratory: Negative for cough and shortness of breath.   Cardiovascular: Negative for chest pain, palpitations and leg swelling.  Gastrointestinal: Negative for abdominal pain, nausea and vomiting.  per hpi   OBJECTIVE:  Today's Vitals   10/03/19 1128  BP: 132/64  Pulse: 94  Temp: 98.5 F (36.9 C)  SpO2: 94%  Weight: 230 lb (104.3 kg)   Body mass index is 45.68 kg/m.  Wt Readings from Last 3 Encounters:  10/03/19 230 lb (104.3 kg)  09/05/19 228 lb (103.4 kg)  08/22/19 229 lb (103.9 kg)    Physical Exam Vitals and nursing note reviewed.    Constitutional:      Appearance: She is well-developed.  HENT:     Head: Normocephalic and atraumatic.     Mouth/Throat:     Pharynx: No oropharyngeal exudate.  Eyes:     General: No scleral icterus.    Conjunctiva/sclera: Conjunctivae normal.     Pupils: Pupils are equal, round, and reactive to light.  Cardiovascular:     Rate and Rhythm: Normal rate and regular rhythm.     Heart sounds: Normal heart sounds. No murmur. No friction rub. No gallop.   Pulmonary:     Effort: Pulmonary effort is normal.     Breath sounds: Normal breath sounds. No wheezing or rales.  Musculoskeletal:     Cervical back: Neck supple.     Right lower leg: Edema present.     Left lower leg: Edema present.  Skin:    General: Skin is warm and dry.  Neurological:     Mental Status: She is alert and oriented to person, place, and time.     No results found for this or any previous visit (from the past 24 hour(s)).  No results found.   ASSESSMENT and PLAN  1. Bilateral lower extremity edema Discussed decreasing olmesartan to allow for increase in lasix dosing. Discussed home BP monitoring. Discussed use of compression stockings and elevation.  2. Vitamin B12 deficiency - Vitamin B12  3. H/O iron deficiency anemia - CBC - Iron, TIBC and Ferritin Panel  Return in about 4 weeks (around 10/31/2019).    Myles Lipps, MD Primary Care at [facility] [address] [place], [place] [address] Ph.  [phone] Fax [phone]

## 2019-10-04 LAB — CBC
Hematocrit: 32.2 % — ABNORMAL LOW (ref 34.0–46.6)
Hemoglobin: 10.7 g/dL — ABNORMAL LOW (ref 11.1–15.9)
MCH: 26 pg — ABNORMAL LOW (ref 26.6–33.0)
MCHC: 33.2 g/dL (ref 31.5–35.7)
MCV: 78 fL — ABNORMAL LOW (ref 79–97)
Platelets: 343 10*3/uL (ref 150–450)
RBC: 4.12 x10E6/uL (ref 3.77–5.28)
RDW: 14.1 % (ref 11.7–15.4)
WBC: 6.1 10*3/uL (ref 3.4–10.8)

## 2019-10-04 LAB — IRON,TIBC AND FERRITIN PANEL
Ferritin: 31 ng/mL (ref 15–150)
Iron Saturation: 14 % — ABNORMAL LOW (ref 15–55)
Iron: 48 ug/dL (ref 27–139)
Total Iron Binding Capacity: 334 ug/dL (ref 250–450)
UIBC: 286 ug/dL (ref 118–369)

## 2019-10-04 LAB — VITAMIN B12: Vitamin B-12: 584 pg/mL (ref 232–1245)

## 2019-10-11 ENCOUNTER — Encounter: Payer: Self-pay | Admitting: Radiology

## 2019-10-16 DIAGNOSIS — H02403 Unspecified ptosis of bilateral eyelids: Secondary | ICD-10-CM | POA: Diagnosis not present

## 2019-10-16 DIAGNOSIS — H2513 Age-related nuclear cataract, bilateral: Secondary | ICD-10-CM | POA: Diagnosis not present

## 2019-10-16 DIAGNOSIS — H40033 Anatomical narrow angle, bilateral: Secondary | ICD-10-CM | POA: Diagnosis not present

## 2019-10-16 DIAGNOSIS — H524 Presbyopia: Secondary | ICD-10-CM | POA: Diagnosis not present

## 2019-10-31 ENCOUNTER — Ambulatory Visit (INDEPENDENT_AMBULATORY_CARE_PROVIDER_SITE_OTHER): Payer: Medicare Other | Admitting: Family Medicine

## 2019-10-31 ENCOUNTER — Encounter: Payer: Self-pay | Admitting: Family Medicine

## 2019-10-31 ENCOUNTER — Other Ambulatory Visit: Payer: Self-pay

## 2019-10-31 VITALS — BP 134/84 | HR 97 | Temp 98.3°F | Ht 59.5 in | Wt 226.4 lb

## 2019-10-31 DIAGNOSIS — R600 Localized edema: Secondary | ICD-10-CM | POA: Diagnosis not present

## 2019-10-31 DIAGNOSIS — I10 Essential (primary) hypertension: Secondary | ICD-10-CM

## 2019-10-31 NOTE — Patient Instructions (Signed)
° ° ° °  If you have lab work done today you will be contacted with your lab results within the next 2 weeks.  If you have not heard from us then please contact us. The fastest way to get your results is to register for My Chart. ° ° °IF you received an x-ray today, you will receive an invoice from [facility]. Please contact [facility] at [phone] with questions or concerns regarding your invoice.  ° °IF you received labwork today, you will receive an invoice from [facility]. Please contact [facility] at [phone] with questions or concerns regarding your invoice.  ° °Our billing staff will not be able to assist you with questions regarding bills from these companies. ° °You will be contacted with the lab results as soon as they are available. The fastest way to get your results is to activate your My Chart account. Instructions are located on the last page of this paperwork. If you have not heard from us regarding the results in 2 weeks, please contact this office. °  ° ° ° °

## 2019-10-31 NOTE — Progress Notes (Signed)
2/11/202111:26 AM  Anna Bernard [birth year], 69 y.o., female [id number]  Chief Complaint  Patient presents with  . Pain    pain in the right foot taking 2 aleave with coffee for the pain    HPI:   Patient is a 69 y.o. female with past medical history significant for dizziness,HTN, HLP,diabetes,microcystic anemia,vitamin B12 deficiencyandhypothyroidismwho presents today for right leg pain  Last OV Anna 2021 - increase lasix due to edema Did not tolerate increase in lasix - made her dizzy, but did help with swelling and pain Left leg sign improved, right leg remains swollen, no tenderness or redness She has been trying to wear compression stockings Takes aleve BID on most days Trying to walk more Has never seen vasc surg  Depression screen [facility] 2/9 10/31/2019 10/03/2019 07/25/2019  Decreased Interest 0 0 0  Down, Depressed, Hopeless 0 0 0  PHQ - 2 Score 0 0 0    Fall Risk  10/31/2019 10/03/2019 07/25/2019 06/27/2019 10/05/2018  Falls in the past year? 0 0 1 0 0  Number falls in past yr: 0 0 1 0 -  Injury with Fall? 0 0 1 0 -  Risk for fall due to : Impaired balance/gait Medication side effect;Impaired mobility;Impaired balance/gait - - -     No Known Allergies  Prior to Admission medications   Medication Sig Start Date End Date Taking? Authorizing Provider  aspirin 81 MG tablet Take 81 mg by mouth daily.   Yes [provider]  [facility] B-12 500 MCG tablet TAKE 1 TABLET (500 MCG TOTAL) BY MOUTH DAILY. 04/30/18  Yes Myles Lipps, MD  furosemide (LASIX) 20 MG tablet Take 1 tablet (20 mg total) by mouth 2 (two) times daily. 08/22/19  Yes Myles Lipps, MD  levothyroxine (SYNTHROID) 125 MCG tablet Take 1 tablet (125 mcg total) by mouth daily. 08/23/19  Yes Myles Lipps, MD  Multiple Vitamin (MULTIVITAMIN WITH MINERALS) TABS Take 1 tablet by mouth daily.   Yes [provider]  olmesartan (BENICAR) 20 MG tablet TAKE 1 TABLET BY MOUTH EVERY DAY Patient taking  differently: Taking 1/2 of the pill daily 09/23/19  Yes Myles Lipps, MD  potassium chloride SA (KLOR-CON) 20 MEQ tablet Take 1 tablet (20 mEq total) by mouth 2 (two) times daily. 08/22/19  Yes Myles Lipps, MD  rosuvastatin (CRESTOR) 10 MG tablet Take 1 tablet (10 mg total) by mouth daily. 11/16/13  Yes Carmelina Dane, MD  trimethoprim (TRIMPEX) 100 MG tablet  10/04/18  Yes [provider]    Past Medical History:  Diagnosis Date  . Anemia, unspecified   . Hyperlipidemia, unspecified   . Hypertension   . Hypothyroidism, unspecified   . Pre-diabetes   . Vitamin B12 deficiency     Past Surgical History:  Procedure Laterality Date  . ABDOMINAL HYSTERECTOMY    . GIVENS CAPSULE STUDY N/A 04/16/2018   Procedure: GIVENS CAPSULE STUDY;  Surgeon: Jeani Hawking, MD;  Location: [facility] ENDOSCOPY;  Service: Endoscopy;  Laterality: N/A;  . SHOULDER SURGERY      Social History   Tobacco Use  . Smoking status: Never Smoker  . Smokeless tobacco: Never Used  Substance Use Topics  . Alcohol use: No    Alcohol/week: 0.0 standard drinks    No family history on file.  Review of Systems  Constitutional: Negative for chills and fever.  Respiratory: Negative for cough and shortness of breath.   Cardiovascular: Negative for chest pain, palpitations and  leg swelling.  Gastrointestinal: Negative for abdominal pain, nausea and vomiting.     OBJECTIVE:  Today's Vitals   10/31/19 1121  BP: 134/84  Pulse: 97  Temp: 98.3 F (36.8 C)  SpO2: 97%  Weight: 226 lb 6.4 oz (102.7 kg)  Height: 4' 11.5" (1.511 m)   Body mass index is 44.96 kg/m.   Physical Exam Vitals and nursing note reviewed.  Constitutional:      Appearance: She is well-developed.  HENT:     Head: Normocephalic and atraumatic.     Mouth/Throat:     Pharynx: No oropharyngeal exudate.  Eyes:     General: No scleral icterus.    Conjunctiva/sclera: Conjunctivae normal.     Pupils: Pupils are equal, round, and  reactive to light.  Cardiovascular:     Rate and Rhythm: Normal rate and regular rhythm.     Heart sounds: Normal heart sounds. No murmur. No friction rub. No gallop.   Pulmonary:     Effort: Pulmonary effort is normal.     Breath sounds: Normal breath sounds. No wheezing or rales.  Musculoskeletal:     Cervical back: Neck supple.     Right lower leg: Edema (+3 pitting edema, no redness or warmth) present.     Left lower leg: Edema (+1 pitting edema) present.  Skin:    General: Skin is warm and dry.  Neurological:     Mental Status: She is alert and oriented to person, place, and time.     No results found for this or any previous visit (from the past 24 hour(s)).  No results found.   ASSESSMENT and PLAN  1. Edema of right lower leg Low suspicion for DVT, cont with compression stockings, referral to amb ref vasc surg - VAS [place] LOWER EXTREMITY VENOUS (DVT); Future  2. Essential hypertension Controlled. Continue current regime.   Return in about 2 months (around 12/29/2019) for DM2/HTN/HLP.    Rutherford Guys, MD Primary Care at [facility] [place], [place] [address] Ph.  [phone] Fax [phone]

## 2019-11-01 ENCOUNTER — Ambulatory Visit (HOSPITAL_COMMUNITY)
Admission: RE | Admit: 2019-11-01 | Discharge: 2019-11-01 | Disposition: A | Discharge status: Home / Self Care | Payer: Medicare Other | Source: Ambulatory Visit | Attending: Cardiology | Admitting: Cardiology

## 2019-11-01 DIAGNOSIS — R600 Localized edema: Secondary | ICD-10-CM | POA: Diagnosis not present

## 2019-11-25 ENCOUNTER — Other Ambulatory Visit: Payer: Self-pay

## 2019-11-25 ENCOUNTER — Ambulatory Visit (HOSPITAL_COMMUNITY)
Admission: RE | Admit: 2019-11-25 | Discharge: 2019-11-25 | Disposition: A | Discharge status: Home / Self Care | Payer: Medicare Other | Source: Ambulatory Visit | Attending: Surgery | Admitting: Surgery

## 2019-11-25 DIAGNOSIS — R600 Localized edema: Secondary | ICD-10-CM | POA: Diagnosis not present

## 2019-11-26 ENCOUNTER — Encounter: Payer: Self-pay | Admitting: Vascular Surgery

## 2019-11-26 ENCOUNTER — Ambulatory Visit (INDEPENDENT_AMBULATORY_CARE_PROVIDER_SITE_OTHER): Payer: Medicare Other | Admitting: Vascular Surgery

## 2019-11-26 DIAGNOSIS — M7989 Other specified soft tissue disorders: Secondary | ICD-10-CM | POA: Insufficient documentation

## 2019-11-26 DIAGNOSIS — I872 Venous insufficiency (chronic) (peripheral): Secondary | ICD-10-CM

## 2019-11-26 NOTE — Progress Notes (Signed)
Patient name: Anna Bernard MRN: [id number] DOB: 08/31/1951 Sex: female  REASON FOR CONSULT: Evaluate right lower extremity edema  HPI: Anna Bernard is a 69 y.o. female, with history of hypertension hyperlipidemia that presents for evaluation of right lower extremity edema.  Patient states she has had significant swelling to the right leg for the last 9 months.  Aside from the swelling she has some discomfort at times.  No ulcers or tissue loss.  She denies any associated trauma.  She has never had any blood clots.  She denies any surgery in that extremity.  She states she tries to keep her legs elevated but sometimes that can be difficult.  She does try and wear compression but has number of different pairs.  She does walk with a walker.  States she quit smoking years ago.  Was worried somewhat about her risk of losing her right leg.  Past Medical History:  Diagnosis Date  . Anemia, unspecified   . Hyperlipidemia, unspecified   . Hypertension   . Hypothyroidism, unspecified   . Pre-diabetes   . Vitamin B12 deficiency     Past Surgical History:  Procedure Laterality Date  . ABDOMINAL HYSTERECTOMY    . GIVENS CAPSULE STUDY N/A 04/16/2018   Procedure: GIVENS CAPSULE STUDY;  Surgeon: Carol Ada, MD;  Location: [place];  Service: Endoscopy;  Laterality: N/A;  . SHOULDER SURGERY      History reviewed. No pertinent family history.  SOCIAL HISTORY: Social History   Socioeconomic History  . Marital status: Single    Spouse name: Not on file  . Number of children: Not on file  . Years of education: Not on file  . Highest education level: Not on file  Occupational History  . Not on file  Tobacco Use  . Smoking status: Never Smoker  . Smokeless tobacco: Never Used  Substance and Sexual Activity  . Alcohol use: No    Alcohol/week: 0.0 standard drinks  . Drug use: No  . Sexual activity: Not Currently  Other Topics Concern  . Not on file  Social History Narrative  .  Not on file   Social Determinants of Health   Financial Resource Strain:   . Difficulty of Paying Living Expenses: Not on file  Food Insecurity:   . Worried About Charity fundraiser in the Last Year: Not on file  . Ran Out of Food in the Last Year: Not on file  Transportation Needs:   . Lack of Transportation (Medical): Not on file  . Lack of Transportation (Non-Medical): Not on file  Physical Activity:   . Days of Exercise per Week: Not on file  . Minutes of Exercise per Session: Not on file  Stress:   . Feeling of Stress : Not on file  Social Connections:   . Frequency of Communication with Friends and Family: Not on file  . Frequency of Social Gatherings with Friends and Family: Not on file  . Attends Religious Services: Not on file  . Active Member of Clubs or Organizations: Not on file  . Attends Archivist Meetings: Not on file  . Marital Status: Not on file  Intimate Partner Violence:   . Fear of Current or Ex-Partner: Not on file  . Emotionally Abused: Not on file  . Physically Abused: Not on file  . Sexually Abused: Not on file    No Known Allergies  Current Outpatient Medications  Medication Sig Dispense Refill  . aspirin 81  MG tablet Take 81 mg by mouth daily.    . CVS B-12 500 MCG tablet TAKE 1 TABLET (500 MCG TOTAL) BY MOUTH DAILY. 30 tablet 2  . furosemide (LASIX) 20 MG tablet Take 1 tablet (20 mg total) by mouth 2 (two) times daily. 60 tablet 3  . levothyroxine (SYNTHROID) 125 MCG tablet Take 1 tablet (125 mcg total) by mouth daily. 90 tablet 3  . Multiple Vitamin (MULTIVITAMIN WITH MINERALS) TABS Take 1 tablet by mouth daily.    Marland Kitchen olmesartan (BENICAR) 20 MG tablet TAKE 1 TABLET BY MOUTH EVERY DAY (Patient taking differently: Taking 1/2 of the pill daily) 90 tablet 1  . potassium chloride SA (KLOR-CON) 20 MEQ tablet Take 1 tablet (20 mEq total) by mouth 2 (two) times daily. 60 tablet 3  . rosuvastatin (CRESTOR) 10 MG tablet Take 1 tablet (10 mg  total) by mouth daily. 90 tablet 1  . trimethoprim (TRIMPEX) 100 MG tablet      No current facility-administered medications for this visit.    REVIEW OF SYSTEMS:  [X]  denotes positive finding, [ ]  denotes negative finding Cardiac  Comments:  Chest pain or chest pressure:    Shortness of breath upon exertion:    Short of breath when lying flat:    Irregular heart rhythm:        Vascular    Pain in calf, thigh, or hip brought on by ambulation:    Pain in feet at night that wakes you up from your sleep:     Blood clot in your veins:    Leg swelling:  x Right      Pulmonary    Oxygen at home:    Productive cough:     Wheezing:         Neurologic    Sudden weakness in arms or legs:     Sudden numbness in arms or legs:     Sudden onset of difficulty speaking or slurred speech:    Temporary loss of vision in one eye:     Problems with dizziness:         Gastrointestinal    Blood in stool:     Vomited blood:         Genitourinary    Burning when urinating:     Blood in urine:        Psychiatric    Major depression:         Hematologic    Bleeding problems:    Problems with blood clotting too easily:        Skin    Rashes or ulcers:        Constitutional    Fever or chills:      PHYSICAL EXAM: Vitals:   11/26/19 1118  BP: 119/69  Pulse: 85  Resp: 14  Temp: 97.9 F (36.6 C)  TempSrc: Temporal  SpO2: 98%  Weight: 229 lb (103.9 kg)  Height: 5\' 1"  (1.549 m)    GENERAL: The patient is a well-nourished female, in no acute distress. The vital signs are documented above. CARDIAC: There is a regular rate and rhythm.  VASCULAR:  Palpable femoral pulses bilaterally Right DP 2+ palpable bilaterally No lower extremity tissue loss Right leg 2+ pitting edema PULMONARY: There is good air exchange bilaterally without wheezing or rales. ABDOMEN: Soft and non-tender with normal pitched bowel sounds.  MUSCULOSKELETAL: There are no major deformities or  cyanosis. NEUROLOGIC: No focal weakness or paresthesias are detected. SKIN: There are no ulcers  or rashes noted. PSYCHIATRIC: The patient has a normal affect.  DATA:   I independently reviewed her right lower extremity venous reflux study from yesterday and she has no long segment superficial reflux.  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  Assessment/Plan:  69 year old female presents with significant right lower extremity swelling.  I reviewed with her that there is no evidence of DVT on her reflux study which I think provided her with significant relief.  She does have evidence of deep reflux and discussed this is consistently managed with conservative measures including leg elevation, compression and weight loss.  In addition she may have some underlying component of lymphedema given the amount of swelling in her leg but I am not certain that any further work-up would be benefited at this time given our recommendations would remain the same.  She has no long segment superficial reflux that would be amendable to surgical intervention.  We sized her for knee-high compression today which I think will be the easiest for her.  I did instruct her to follow-up with [place] as needed.   Cephus Shelling, MD [facility] Office: [phone]

## 2019-12-09 ENCOUNTER — Telehealth: Payer: Self-pay | Admitting: Family Medicine

## 2019-12-14 ENCOUNTER — Ambulatory Visit: Payer: Medicare Other | Attending: Internal Medicine

## 2019-12-14 DIAGNOSIS — Z23 Encounter for immunization: Secondary | ICD-10-CM

## 2019-12-14 NOTE — Progress Notes (Signed)
   Covid-19 Vaccination Clinic  Name:  Anna Bernard    MRN: [id number] DOB: 07/07/1951  12/14/2019  Anna Bernard was observed post Covid-19 immunization for 15 minutes without incident. She was provided with Vaccine Information Sheet and instruction to access the V-Safe system.   Anna Bernard was instructed to call 911 with any severe reactions post vaccine: Marland Kitchen Difficulty breathing  . Swelling of face and throat  . A fast heartbeat  . A bad rash all over body  . Dizziness and weakness   Immunizations Administered    Name Date Dose VIS Date Route   Pfizer COVID-19 Vaccine 12/14/2019 12:26 PM 0.3 mL 08/30/2019 Intramuscular   Manufacturer: [employer], [employer]   Lot: [id number]   NDC: [address]-6

## 2019-12-30 ENCOUNTER — Ambulatory Visit (INDEPENDENT_AMBULATORY_CARE_PROVIDER_SITE_OTHER): Payer: Medicare Other | Admitting: Registered Nurse

## 2019-12-30 ENCOUNTER — Other Ambulatory Visit: Payer: Self-pay

## 2019-12-30 ENCOUNTER — Ambulatory Visit: Payer: Medicare Other | Admitting: Family Medicine

## 2019-12-30 ENCOUNTER — Encounter: Payer: Self-pay | Admitting: Registered Nurse

## 2019-12-30 VITALS — BP 135/82 | HR 89 | Temp 98.1°F | Resp 18 | Ht 61.0 in | Wt 215.0 lb

## 2019-12-30 DIAGNOSIS — E079 Disorder of thyroid, unspecified: Secondary | ICD-10-CM | POA: Diagnosis not present

## 2019-12-30 DIAGNOSIS — Z862 Personal history of diseases of the blood and blood-forming organs and certain disorders involving the immune mechanism: Secondary | ICD-10-CM | POA: Diagnosis not present

## 2019-12-30 DIAGNOSIS — E119 Type 2 diabetes mellitus without complications: Secondary | ICD-10-CM

## 2019-12-30 LAB — POCT GLYCOSYLATED HEMOGLOBIN (HGB A1C): Hemoglobin A1C: 6.3 % — AB (ref 4.0–5.6)

## 2019-12-30 NOTE — Patient Instructions (Addendum)
Ms Cate Oravec to meet you today. I'm glad your swelling has improved.  Please try to walk more frequently, keep your legs elevated when you're sitting down, and wear the compression stockings.  Your A1c is 6.3 today - this is about steady from last time. Keep an eye on your diet and get a little bit of exercise, and we can see this improve in no time.   I don't want to change your medications at this time - your blood pressure is good, your swelling is improving, it seems like things are generally working, so I don't want to rock the boat.  I'll be in touch when your lab results are back.  Please let me know if you have any concerns.   Jari Sportsman, NP    If you have lab work done today you will be contacted with your lab results within the next 2 weeks.  If you have not heard from [place] then please contact us. The fastest way to get your results is to register for My Chart.   IF you received an x-ray today, you will receive an invoice from [facility]. Please contact [facility] at [phone] with questions or concerns regarding your invoice.   IF you received labwork today, you will receive an invoice from [facility]. Please contact [facility] at [phone] with questions or concerns regarding your invoice.   Our billing staff will not be able to assist you with questions regarding bills from these companies.  You will be contacted with the lab results as soon as they are available. The fastest way to get your results is to activate your My Chart account. Instructions are located on the last page of this paperwork. If you have not heard from [place] regarding the results in 2 weeks, please contact this office.

## 2019-12-30 NOTE — Progress Notes (Signed)
Established Patient Office Visit  Subjective:  Patient ID: Anna Bernard, female    DOB: August 23, 1951  Age: 69 y.o. MRN: [id number]  CC:  Chief Complaint  Patient presents with  . Follow-up    2 month follow up on medical condition .Per patient she has some swelling in the right leg. Also Would like to talk about reducing medication    HPI Anna Bernard presents for follow up on chronic conditions  Reports that she's feeling well, swelling has improved.  Went to [facility], who fitted her for compression stockings and suggested that she follow up PRN.  Admits that she has not been exercising and has been almost entirely sedentary since last visit.  Tolerating medication well. No dizziness or falls. Has been taking 1/2 tab olmesartan and lasix bid with good effect.  Interested in decreasing the doses of her medications at this time.  Past Medical History:  Diagnosis Date  . Anemia, unspecified   . Hyperlipidemia, unspecified   . Hypertension   . Hypothyroidism, unspecified   . Pre-diabetes   . Vitamin B12 deficiency     Past Surgical History:  Procedure Laterality Date  . ABDOMINAL HYSTERECTOMY    . GIVENS CAPSULE STUDY N/A 04/16/2018   Procedure: GIVENS CAPSULE STUDY;  Surgeon: Jeani Hawking, MD;  Location: [facility] ENDOSCOPY;  Service: Endoscopy;  Laterality: N/A;  . SHOULDER SURGERY      No family history on file.  Social History   Socioeconomic History  . Marital status: Single    Spouse name: Not on file  . Number of children: Not on file  . Years of education: Not on file  . Highest education level: Not on file  Occupational History  . Not on file  Tobacco Use  . Smoking status: Never Smoker  . Smokeless tobacco: Never Used  Substance and Sexual Activity  . Alcohol use: No    Alcohol/week: 0.0 standard drinks  . Drug use: No  . Sexual activity: Not Currently  Other Topics Concern  . Not on file  Social History Narrative  . Not on file   Social  Determinants of Health   Financial Resource Strain:   . Difficulty of Paying Living Expenses:   Food Insecurity:   . Worried About Programme researcher, broadcasting/film/video in the Last Year:   . Barista in the Last Year:   Transportation Needs:   . Freight forwarder (Medical):   Marland Kitchen Lack of Transportation (Non-Medical):   Physical Activity:   . Days of Exercise per Week:   . Minutes of Exercise per Session:   Stress:   . Feeling of Stress :   Social Connections:   . Frequency of Communication with Friends and Family:   . Frequency of Social Gatherings with Friends and Family:   . Attends Religious Services:   . Active Member of Clubs or Organizations:   . Attends Banker Meetings:   Marland Kitchen Marital Status:   Intimate Partner Violence:   . Fear of Current or Ex-Partner:   . Emotionally Abused:   Marland Kitchen Physically Abused:   . Sexually Abused:     Outpatient Medications Prior to Visit  Medication Sig Dispense Refill  . aspirin 81 MG tablet Take 81 mg by mouth daily.    . [facility]S B-12 500 MCG tablet TAKE 1 TABLET (500 MCG TOTAL) BY MOUTH DAILY. 30 tablet 2  . furosemide (LASIX) 20 MG tablet Take 1 tablet (20 mg total) by  mouth 2 (two) times daily. 60 tablet 3  . levothyroxine (SYNTHROID) 125 MCG tablet Take 1 tablet (125 mcg total) by mouth daily. 90 tablet 3  . Multiple Vitamin (MULTIVITAMIN WITH MINERALS) TABS Take 1 tablet by mouth daily.    Marland Kitchen olmesartan (BENICAR) 20 MG tablet TAKE 1 TABLET BY MOUTH EVERY DAY (Patient taking differently: Taking 1/2 of the pill daily) 90 tablet 1  . potassium chloride SA (KLOR-CON) 20 MEQ tablet Take 1 tablet (20 mEq total) by mouth 2 (two) times daily. 60 tablet 3  . rosuvastatin (CRESTOR) 10 MG tablet Take 1 tablet (10 mg total) by mouth daily. 90 tablet 1  . trimethoprim (TRIMPEX) 100 MG tablet      No facility-administered medications prior to visit.    No Known Allergies  ROS Review of Systems  Constitutional: Negative.   HENT: Negative.    Eyes: Negative.   Respiratory: Negative.   Cardiovascular: Positive for leg swelling. Negative for chest pain and palpitations.  Gastrointestinal: Negative.   Endocrine: Negative.   Genitourinary: Negative.   Musculoskeletal: Negative.   Skin: Negative.   Allergic/Immunologic: Negative.   Neurological: Negative.   Hematological: Negative.   Psychiatric/Behavioral: Negative.   All other systems reviewed and are negative.     Objective:    Physical Exam  Constitutional: She is oriented to person, place, and time. She appears well-developed and well-nourished. No distress.  Cardiovascular: Normal rate, regular rhythm, normal heart sounds and intact distal pulses. Exam reveals no gallop and no friction rub.  No murmur heard. Pulmonary/Chest: Effort normal and breath sounds normal. No respiratory distress. She has no wheezes. She has no rales. She exhibits no tenderness.  Musculoskeletal:        General: Edema (+2 pitting RLE, +1 pitting LLE) present. No tenderness. Normal range of motion.  Neurological: She is alert and oriented to person, place, and time.  Skin: Skin is warm and dry. No rash noted. She is not diaphoretic. No erythema. No pallor.  Psychiatric: She has a normal mood and affect. Her behavior is normal. Judgment and thought content normal.  Nursing note and vitals reviewed.   BP 135/82   Pulse 89   Temp 98.1 F (36.7 C) (Temporal)   Resp 18   Ht 5\' 1"  (1.549 m)   Wt 215 lb (97.5 kg)   SpO2 100%   BMI 40.62 kg/m  Wt Readings from Last 3 Encounters:  12/30/19 215 lb (97.5 kg)  11/26/19 229 lb (103.9 kg)  10/31/19 226 lb 6.4 oz (102.7 kg)     There are no preventive care reminders to display for this patient.  There are no preventive care reminders to display for this patient.  Lab Results  Component Value Date   TSH 3.410 09/02/2019   Lab Results  Component Value Date   WBC 6.1 10/03/2019   HGB 10.7 (L) 10/03/2019   HCT 32.2 (L) 10/03/2019   MCV  78 (L) 10/03/2019   PLT 343 10/03/2019   Lab Results  Component Value Date   NA 141 09/02/2019   K 4.1 09/02/2019   CO2 26 09/02/2019   GLUCOSE 88 09/02/2019   BUN 17 09/02/2019   CREATININE 0.88 09/02/2019   BILITOT <0.2 06/27/2019   ALKPHOS 88 06/27/2019   AST 24 06/27/2019   ALT 31 06/27/2019   PROT 7.3 06/27/2019   ALBUMIN 4.2 06/27/2019   CALCIUM 9.4 09/02/2019   ANIONGAP 7 09/22/2018   Lab Results  Component Value Date  CHOL 165 06/27/2019   Lab Results  Component Value Date   HDL 53 06/27/2019   Lab Results  Component Value Date   LDLCALC 83 06/27/2019   Lab Results  Component Value Date   TRIG 168 (H) 06/27/2019   Lab Results  Component Value Date   CHOLHDL 3.1 06/27/2019   Lab Results  Component Value Date   HGBA1C 6.3 (A) 12/30/2019      Assessment & Plan:   Problem List Items Addressed This Visit      Endocrine   Thyroid disease   Relevant Orders   TSH   Diabetes mellitus type 2, controlled, without complications ([place]) - Primary   Relevant Orders   POCT glycosylated hemoglobin (Hb A1C) (Completed)    Other Visit Diagnoses    H/O iron deficiency anemia       Relevant Orders   CBC      No orders of the defined types were placed in this encounter.   Follow-up: No follow-ups on file.   PLAN  Labs collected, will follow up as warranted  Pt appears well  A1c at 6.3- suggest better control of diet and exercise.   Will not change medications at this time  Patient encouraged to call clinic with any questions, comments, or concerns.  Maximiano Coss, NP

## 2019-12-31 ENCOUNTER — Encounter: Payer: Self-pay | Admitting: Registered Nurse

## 2019-12-31 DIAGNOSIS — Z862 Personal history of diseases of the blood and blood-forming organs and certain disorders involving the immune mechanism: Secondary | ICD-10-CM

## 2019-12-31 DIAGNOSIS — E079 Disorder of thyroid, unspecified: Secondary | ICD-10-CM

## 2019-12-31 LAB — TSH: TSH: 5.71 u[IU]/mL — ABNORMAL HIGH (ref 0.450–4.500)

## 2019-12-31 LAB — CBC
Hematocrit: 31.7 % — ABNORMAL LOW (ref 34.0–46.6)
Hemoglobin: 10.3 g/dL — ABNORMAL LOW (ref 11.1–15.9)
MCH: 25.6 pg — ABNORMAL LOW (ref 26.6–33.0)
MCHC: 32.5 g/dL (ref 31.5–35.7)
MCV: 79 fL (ref 79–97)
Platelets: 319 10*3/uL (ref 150–450)
RBC: 4.03 x10E6/uL (ref 3.77–5.28)
RDW: 14.9 % (ref 11.7–15.4)
WBC: 5.4 10*3/uL (ref 3.4–10.8)

## 2020-01-07 ENCOUNTER — Ambulatory Visit: Payer: Medicare Other | Attending: Internal Medicine

## 2020-01-07 DIAGNOSIS — Z23 Encounter for immunization: Secondary | ICD-10-CM

## 2020-01-07 NOTE — Progress Notes (Signed)
   Covid-19 Vaccination Clinic  Name:  Anna Bernard    MRN: [id number] DOB: [birth year]  01/07/2020  Anna Bernard was observed post Covid-19 immunization for 15 minutes without incident. She was provided with Vaccine Information Sheet and instruction to access the V-Safe system.   Anna Bernard was instructed to call 911 with any severe reactions post vaccine: Marland Kitchen Difficulty breathing  . Swelling of face and throat  . A fast heartbeat  . A bad rash all over body  . Dizziness and weakness   Immunizations Administered    Name Date Dose VIS Date Route   Pfizer COVID-19 Vaccine 01/07/2020 11:21 AM 0.3 mL 11/13/2018 Intramuscular   Manufacturer: [employer], [employer]   Lot: [id number]   NDC: [address]-0

## 2020-01-13 IMAGING — MR MR ANKLE*R* W/O CM
4 of 5 series · 12 of 40 positions shown · non-contrast
Comparison: Right ankle x-rays dated August 24, 2018.

CLINICAL DATA: Chronic right ankle pain and swelling.

EXAM:
MRI OF THE RIGHT ANKLE WITHOUT CONTRAST
TECHNIQUE: Multiplanar, multisequence MR imaging of the ankle was performed. No
intravenous contrast was administered.

[Series 4: PD fat-sat · axial · right · 3.0mm · 0.25mm/px · z∈[-72,+15]mm · 3 of 30 slices shown]
[im 4/30]
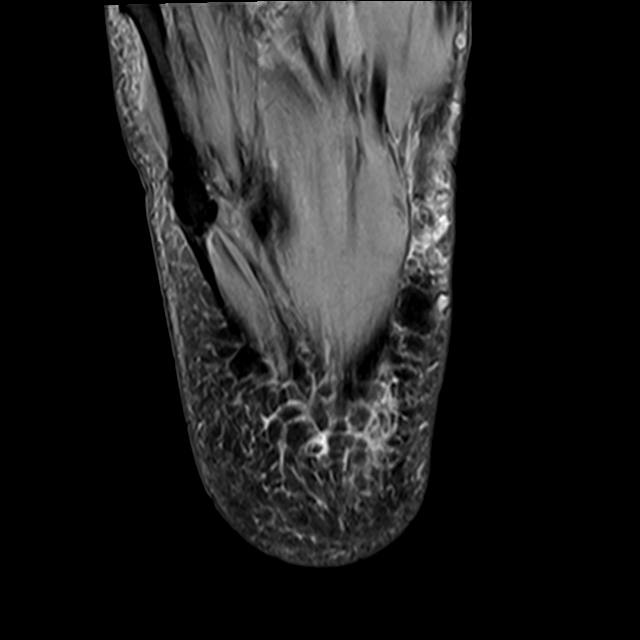
[im 15/30]
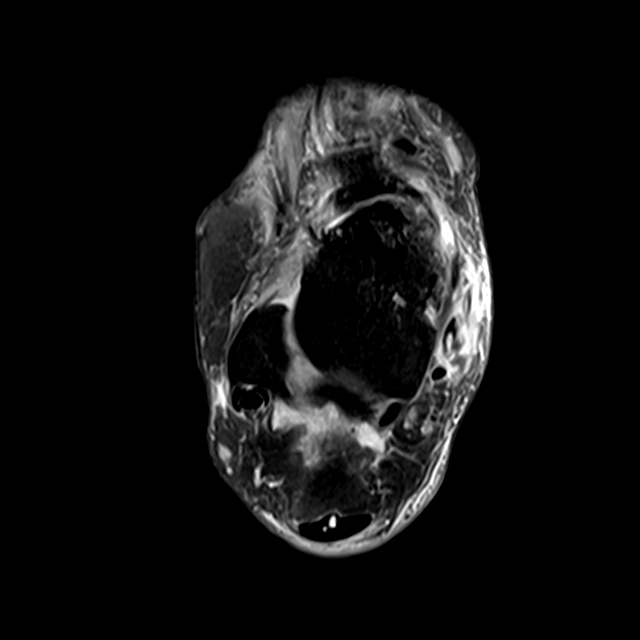
[im 26/30]
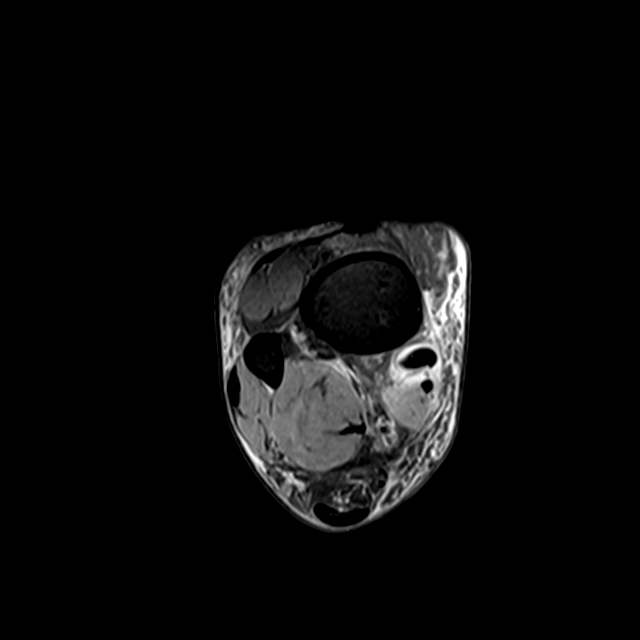

[Series 5: T2 fat-sat · axial · right · 3.0mm · 0.25mm/px · z∈[-72,+15]mm · 3 of 30 slices shown (1 of 2)]
[im 4/30]
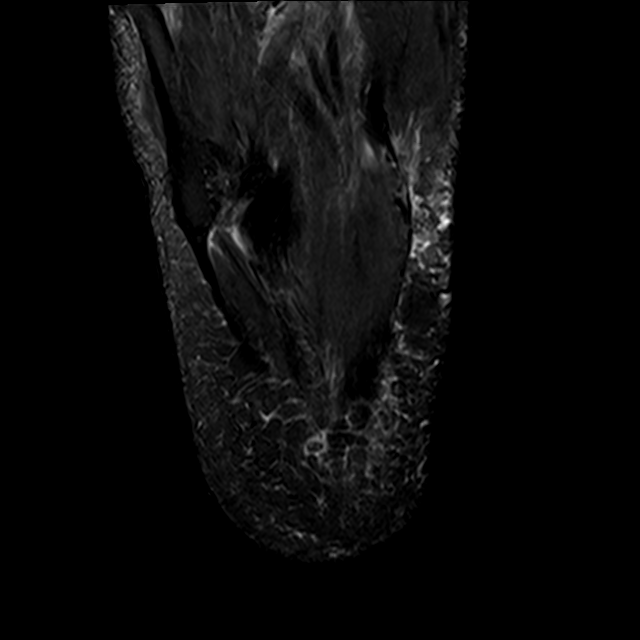
[im 15/30]
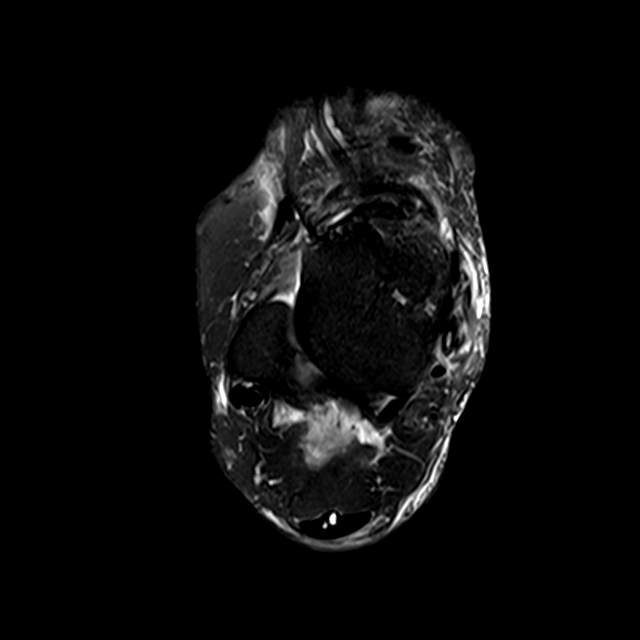
[im 26/30]
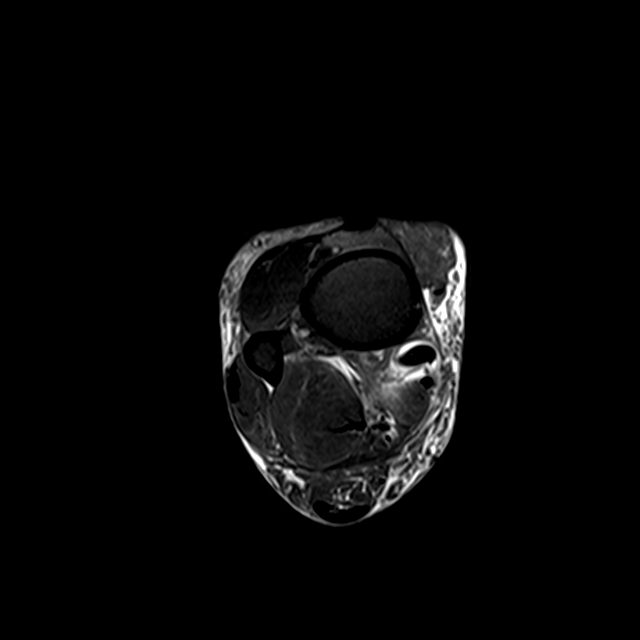

[Series 6: T1 · sagittal · right · 4.0mm · 0.27mm/px · 3 of 22 slices shown]
[im 5/22]
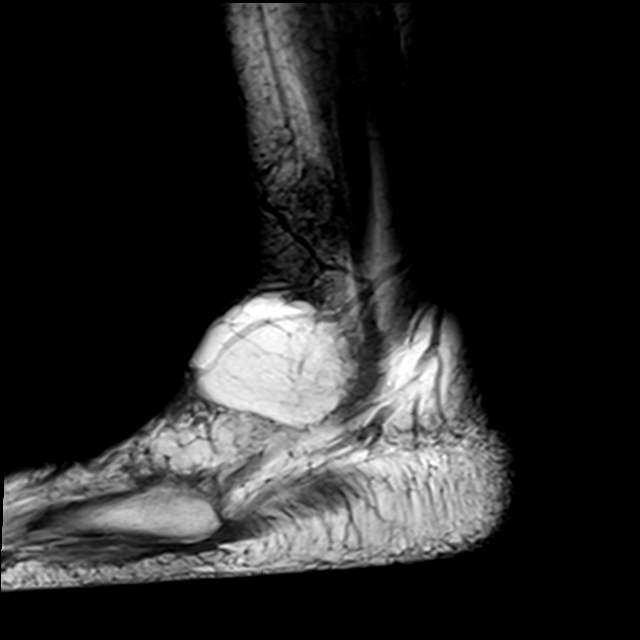
[im 13/22]
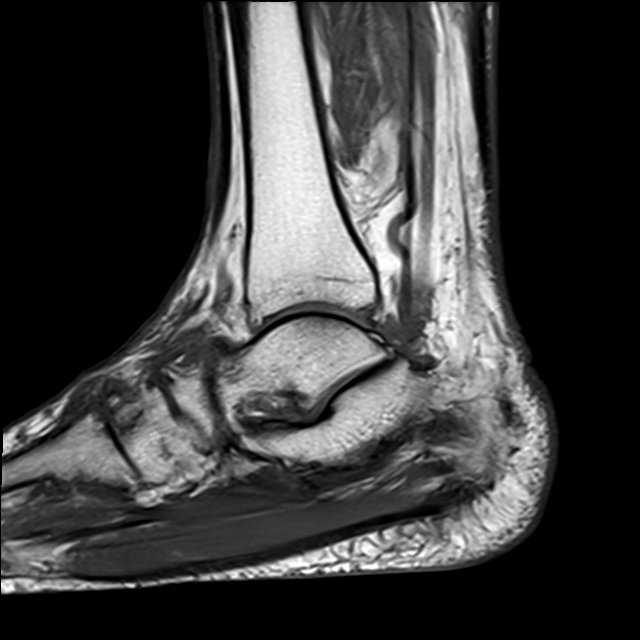
[im 22/22]
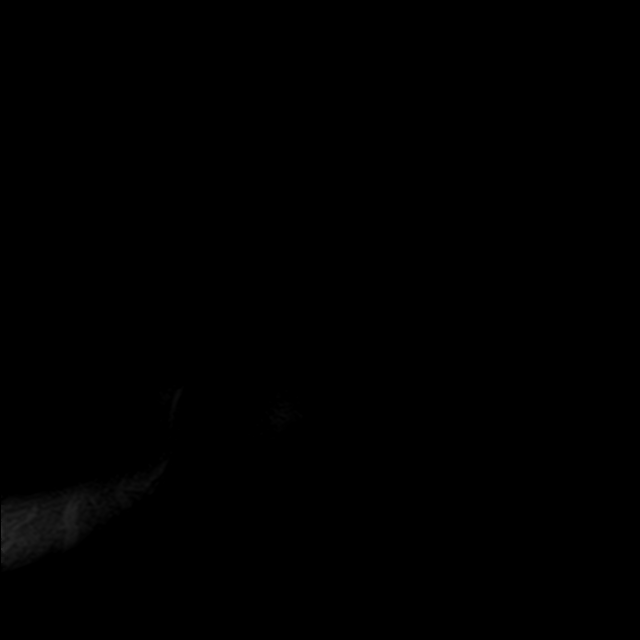

[Series 9: T2 fat-sat · coronal · right · 3.0mm · 0.25mm/px · 3 of 36 slices shown (2 of 2)]
[im 4/36]
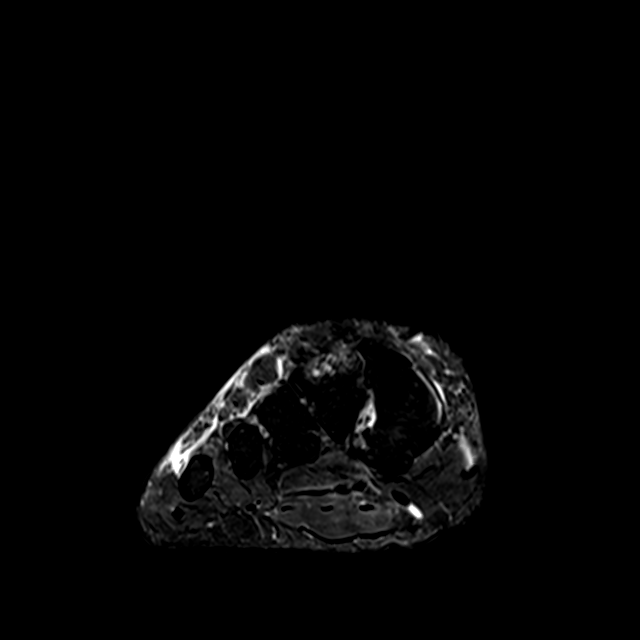
[im 20/36]
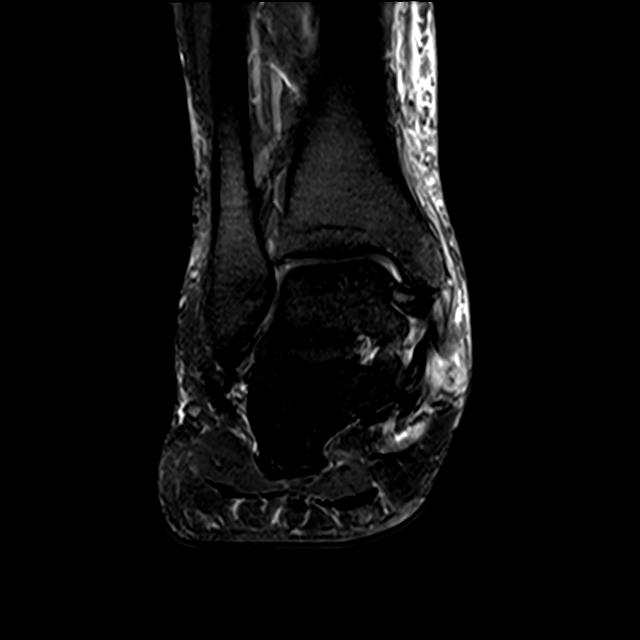
[im 32/36]
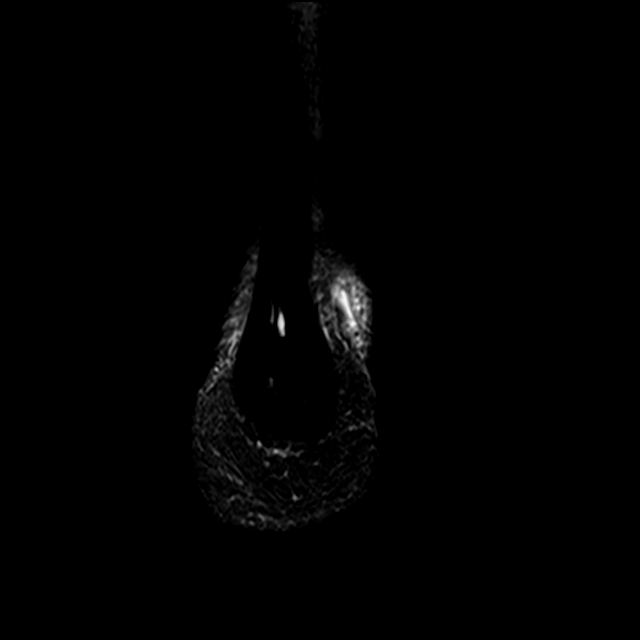

[12 of 40 positions shown; findings below may reference images not displayed]

FINDINGS: TENDONS

Peroneal: Peroneal longus tendon intact. Split tear of the peroneal
brevis tendon. Small amount of fluid in the peroneal tendon sheath.

Posteromedial: Severe tendinosis and partial tearing of the
posterior tibial tendon. Flexor hallucis longus tendon intact.
Flexor digitorum longus tendon intact.

Anterior: Tibialis anterior tendon intact. Extensor hallucis longus
tendon intact Extensor digitorum longus tendon intact.

Achilles: Small focal intrasubstance tear of the distal Achilles
tendon.

Plantar Fascia: Intact.

LIGAMENTS

Lateral: Anterior talofibular ligament intact. Calcaneofibular
ligament intact. Posterior talofibular ligament intact. Anterior and
posterior tibiofibular ligaments intact.

Medial: Deltoid ligament intact. Spring ligament intact.

CARTILAGE

Ankle Joint: No joint effusion. A tiny focus of marrow edema in the
medial talar dome with overlying cartilage irregularity.

Subtalar Joints/Sinus Tarsi: Normal subtalar joints. No subtalar
joint effusion. Loss of the normal fat within the sinus tarsi.

Bones: Pes planovalgus. Prominent dorsal spurring of the
talonavicular and naviculocuneiform joints. Moderate second TMT
joint osteoarthritis. Mild marrow edema in the medial talar head. No
acute fracture or dislocation. No focal bone lesion.

Soft Tissue: Moderate medial and mild lateral ankle soft tissue
swelling. No soft tissue mass or fluid collection. Edema and atrophy
of the abductor hallucis muscle.
IMPRESSION: 1. Severe tendinosis and partial tearing of the posterior tibial
tendon.
2. Split tear of the peroneal brevis tendon. Mild peroneal
tenosynovitis.
3. Small focal intrasubstance tear of the distal Achilles tendon.
4. Pes planovalgus. Midfoot osteoarthritis, moderate at the second
TMT joint.
5. Loss of the normal signal within the sinus tarsi. Correlate for
sinus tarsi syndrome.
6. Tiny osteochondral lesion of the medial talar dome.

## 2020-01-13 IMAGING — MR MR FOOT*R* W/O CM
5 series · 39 of 40 positions shown · non-contrast
Comparison: None.

CLINICAL DATA: Chronic severe foot pain and swelling.

EXAM:
MRI OF THE RIGHT FOREFOOT WITHOUT CONTRAST
TECHNIQUE: Multiplanar, multisequence MR imaging of the right forefoot was
performed. No intravenous contrast was administered.

[Series 4: T1 · coronal · right · 3.0mm · 0.31mm/px · 11 of 40 slices shown (1 of 2)]
[im 1/40]
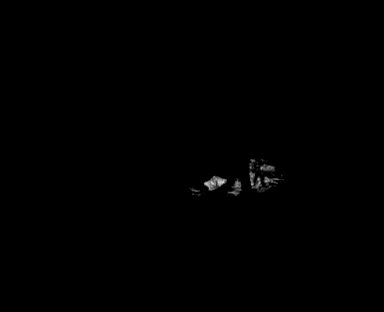
[im 4/40]
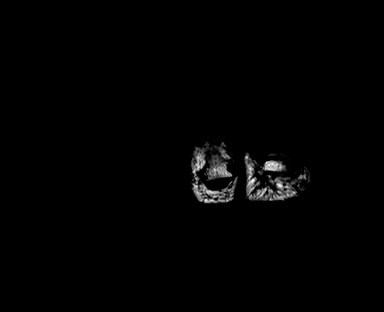
[im 8/40]
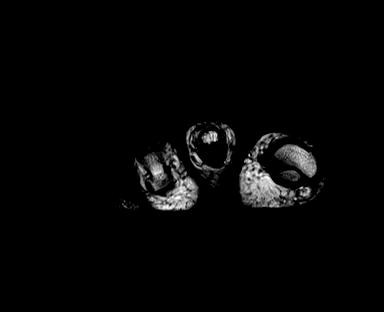
[im 11/40]
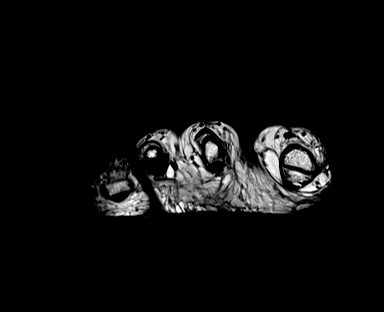
[im 15/40]
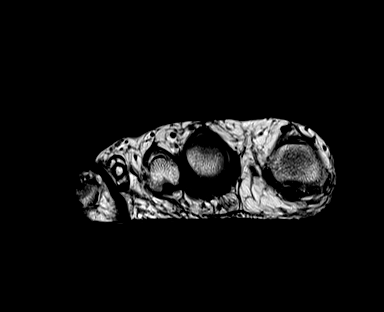
[im 18/40]
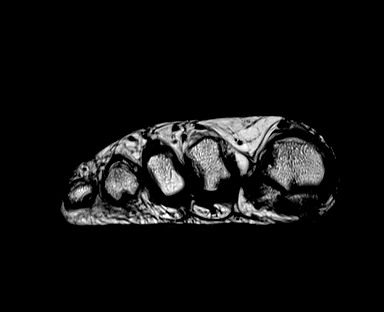
[im 22/40]
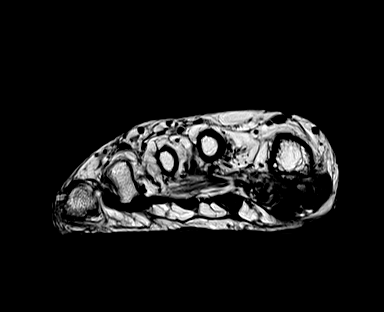
[im 29/40]
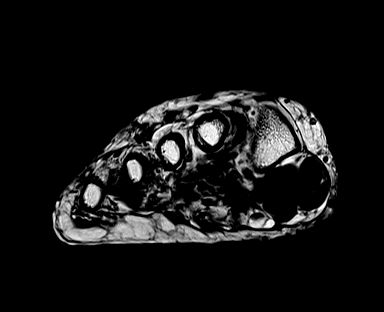
[im 32/40]
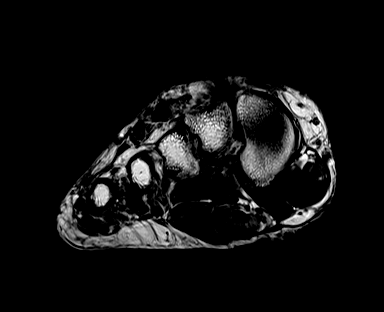
[im 36/40]
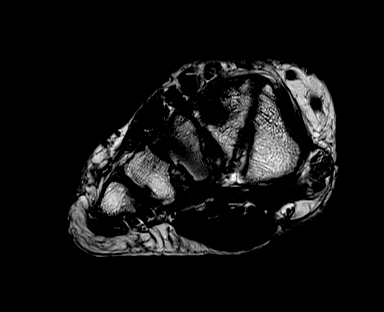
[im 40/40]
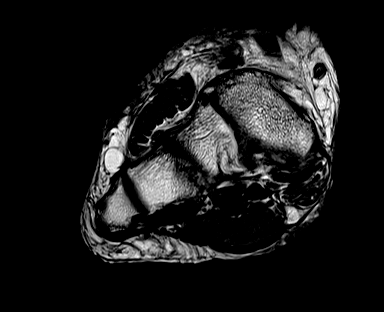

[Series 5: T2 fat-sat · coronal · right · 3.0mm · 0.31mm/px · 11 of 40 slices shown (1 of 2)]
[im 1/40]
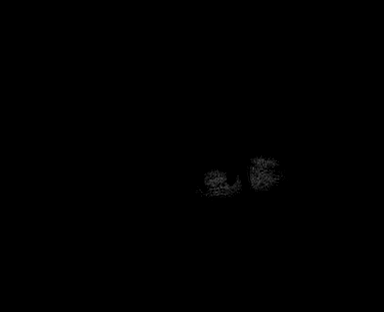
[im 4/40]
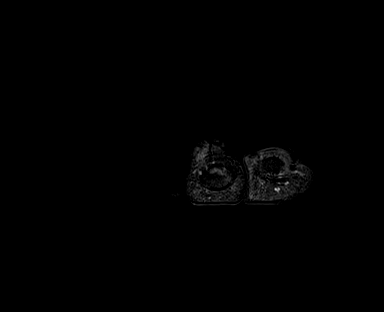
[im 8/40]
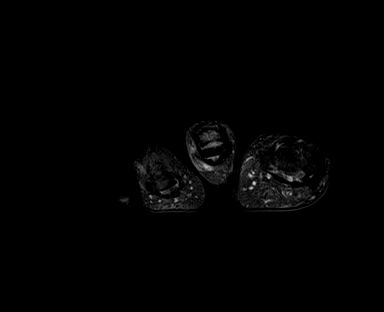
[im 12/40]
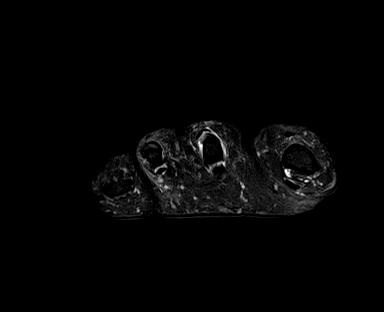
[im 16/40]
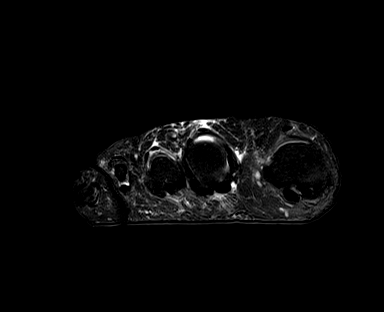
[im 20/40]
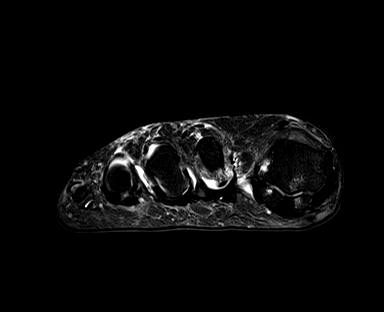
[im 24/40]
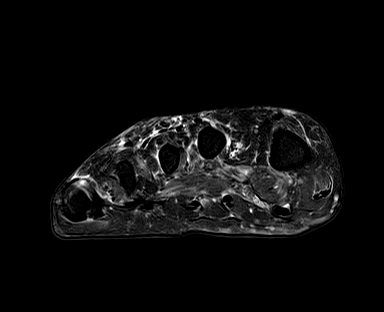
[im 28/40]
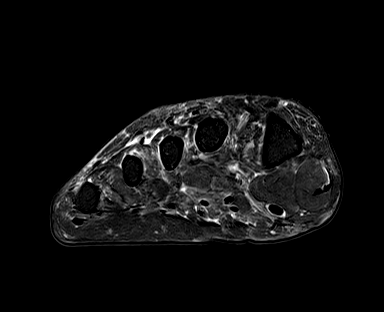
[im 32/40]
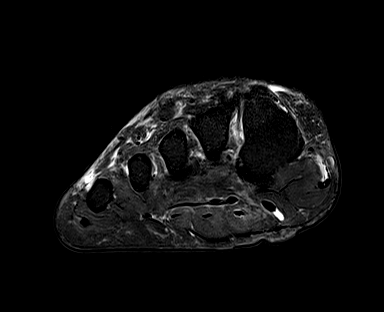
[im 36/40]
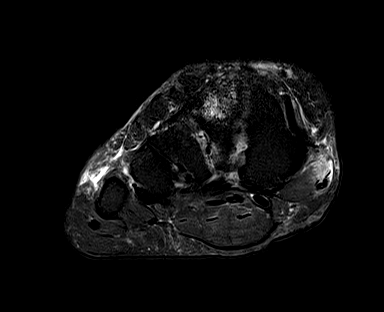
[im 40/40]
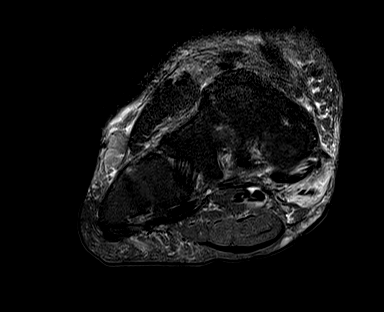

[Series 7: T1 · axial · right · 3.0mm · 0.47mm/px · z∈[-158,-90]mm · 5 of 20 slices shown (2 of 2)]
[im 1/20]
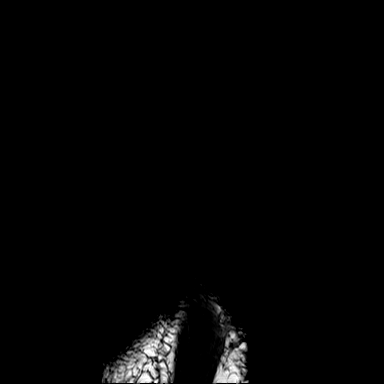
[im 5/20]
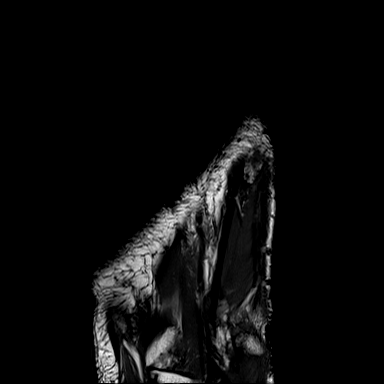
[im 10/20]
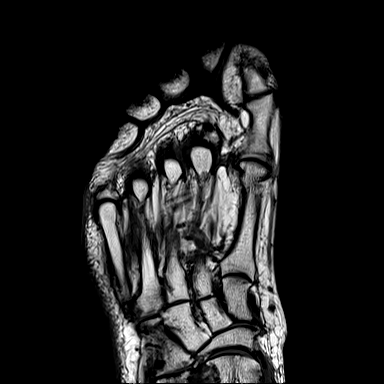
[im 15/20]
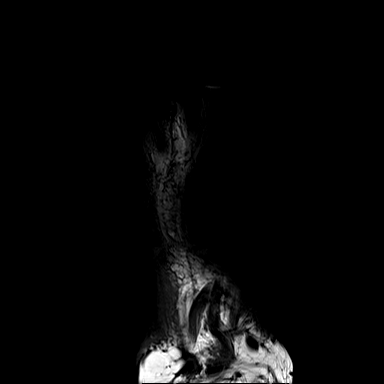
[im 20/20]
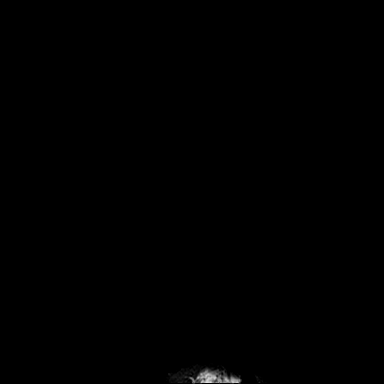

[Series 8: T2 fat-sat · axial · right · 3.0mm · 0.47mm/px · z∈[-158,-90]mm · 5 of 20 slices shown (2 of 2)]
[im 1/20]
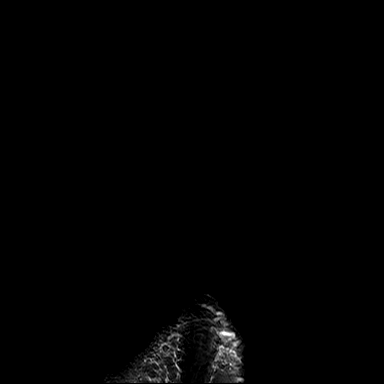
[im 5/20]
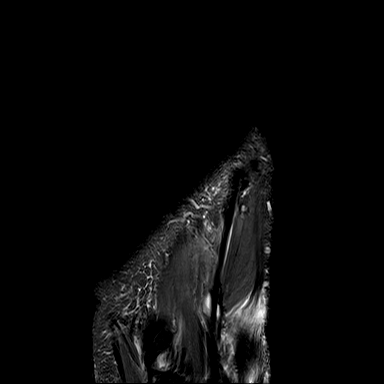
[im 10/20]
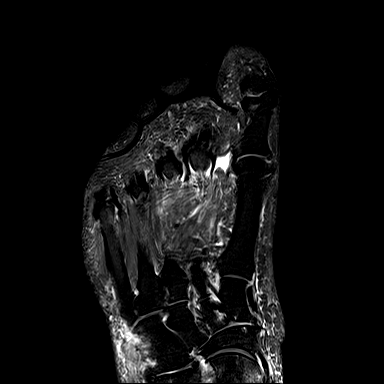
[im 15/20]
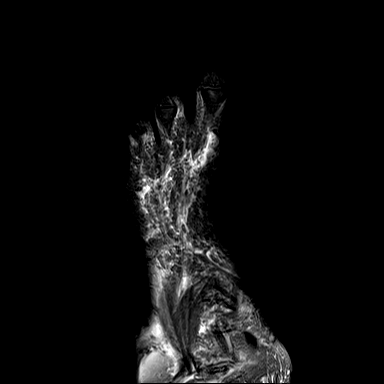
[im 20/20]
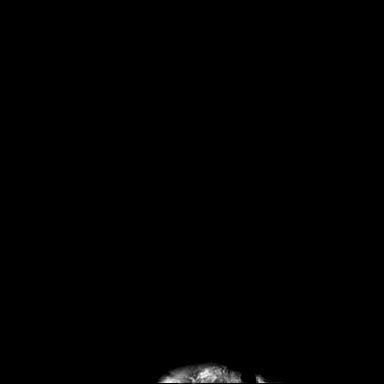

[Series 9: STIR · sagittal · right · 3.0mm · 0.56mm/px · 7 of 27 slices shown]
[im 1/27]
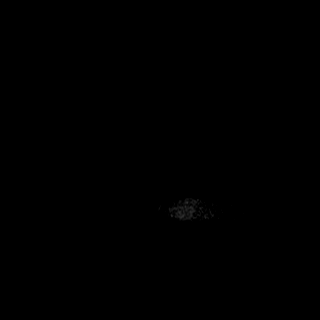
[im 5/27]
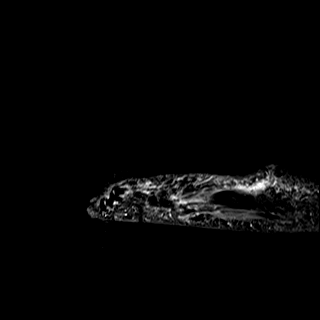
[im 9/27]
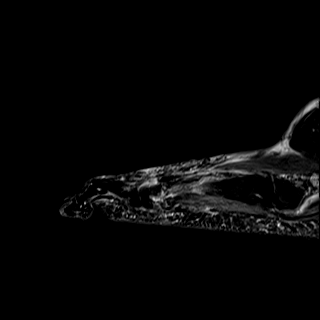
[im 14/27]
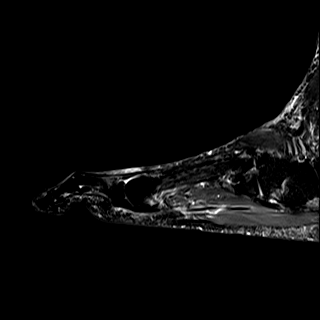
[im 18/27]
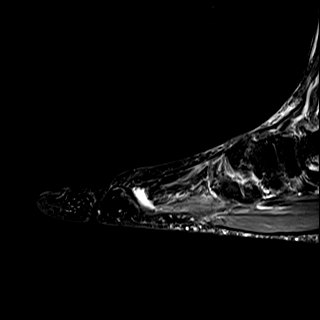
[im 22/27]
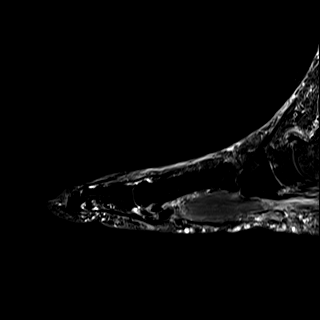
[im 27/27]
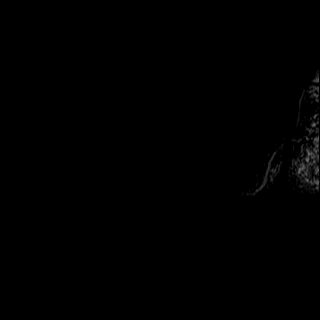

[39 of 40 positions shown; findings below may reference images not displayed]

FINDINGS: Bones/Joint/Cartilage

Prominent degenerative subchondral marrow edema and cystic change at
the second TMT joint. Mild osteoarthritis of the first MTP joint and
first metatarso-sesamoid joint. No suspicious marrow signal
abnormality. No fracture or dislocation. Normal alignment. No joint
effusion. Small amount of fluid in the intermetatarsal bursae.

Ligaments

Collateral ligaments are intact.  Lisfranc ligament is intact.

Muscles and Tendons
Flexor and extensor compartment tendons are intact. There is fluid
within the flexor hallucis longus tendon sheath. Prominent edema
within and mild atrophy of the abductor hallucis muscle.

Soft tissue
Mild dorsal forefoot soft tissue swelling. No fluid collection or
hematoma. No soft tissue mass.
IMPRESSION: 1. No acute abnormality.
2. Moderate second TMT joint osteoarthritis. Mild first MTP and
metatarso-sesamoid joint osteoarthritis.
3. Mild flexor hallucis longus tenosynovitis.
4. Atrophy and edema of the abductor hallucis muscle may be related
to denervation.

## 2020-01-23 ENCOUNTER — Ambulatory Visit (INDEPENDENT_AMBULATORY_CARE_PROVIDER_SITE_OTHER): Payer: Medicare Other | Admitting: Family Medicine

## 2020-01-23 VITALS — BP 135/82 | Ht 59.5 in | Wt 215.0 lb

## 2020-01-23 DIAGNOSIS — Z0000 Encounter for general adult medical examination without abnormal findings: Secondary | ICD-10-CM

## 2020-01-23 NOTE — Progress Notes (Signed)
Presents today for [employer] Visit   Date of last exam: 12/30/2019  Interpreter used for this visit? No  I connected with  [name] on 01/23/20 by a telephone  and verified that I am speaking with the correct person using two identifiers.   I discussed the limitations of evaluation and management by telemedicine. The patient expressed understanding and agreed to proceed.    Patient Care Team: Myles Lipps, MD as PCP - General (Family Medicine) Marcy Salvo, NP (Inactive) as Nurse Practitioner (Nurse Practitioner)   Other items to address today:  Discussed immunizations Discussed Eye/Dental 7-12 @ 10:20 3 month follow up Dr. Leretha Pol     Other Screening: Last screening for diabetes: 12/30/2019 Last lipid screening: 06/27/2019  ADVANCE DIRECTIVES: Discussed: yes On File: no  Materials Provided: yes   Immunization status:  Immunization History  Administered Date(s) Administered  . Fluad Quad(high Dose 65+) 06/27/2019  . Influenza,inj,Quad PF,6+ Mos 05/29/2018  . PFIZER SARS-COV-2 Vaccination 12/14/2019, 01/07/2020  . Pneumococcal Conjugate-13 06/27/2019  . Pneumococcal Polysaccharide-23 01/31/2018     Health Maintenance Due  Topic Date Due  . FOOT EXAM  08/25/2019     Functional Status Survey: Is the patient deaf or have difficulty hearing?: No Does the patient have difficulty seeing, even when wearing glasses/contacts?: No Does the patient have difficulty concentrating, remembering, or making decisions?: No Does the patient have difficulty walking or climbing stairs?: Yes Does the patient have difficulty dressing or bathing?: No Does the patient have difficulty doing errands alone such as visiting a doctor's office or shopping?: Yes(has to arrange trasportation)   6CIT Screen 01/23/2020  What Year? 0 points  What month? 0 points  What time? 0 points  Count back from 20 0 points  Months in reverse 0 points  Repeat  phrase 0 points  Total Score 0        Clinical Support from 01/23/2020 in [facility] [place]  AUDIT-C Score  0       Home Environment:   Lives in two story home Yes has trouble climbing stairs No scattered rugs Yes grab bars Adequate lighting/ no clutter   Patient Active Problem List   Diagnosis Date Noted  . Leg swelling 11/26/2019  . Chronic venous insufficiency 11/26/2019  . Vitamin B12 deficiency 02/06/2018  . Sepsis secondary to UTI (HCC) 01/29/2018  . Microcytic anemia 01/29/2018  . Renal insufficiency 01/29/2018  . Pure hypercholesterolemia 12/24/2014  . Diabetes mellitus type 2, controlled, without complications (HCC) 12/24/2014  . Bruit of right carotid artery 12/24/2014  . Thyroid disease 07/06/2014  . Severe obesity (BMI >= 40) (HCC) 07/06/2014  . HTN (hypertension) 07/06/2014     Past Medical History:  Diagnosis Date  . Anemia, unspecified   . Hyperlipidemia, unspecified   . Hypertension   . Hypothyroidism, unspecified   . Pre-diabetes   . Vitamin B12 deficiency      Past Surgical History:  Procedure Laterality Date  . ABDOMINAL HYSTERECTOMY    . GIVENS CAPSULE STUDY N/A 04/16/2018   Procedure: GIVENS CAPSULE STUDY;  Surgeon: Jeani Hawking, MD;  Location: [facility] ENDOSCOPY;  Service: Endoscopy;  Laterality: N/A;  . SHOULDER SURGERY       History reviewed. No pertinent family history.   Social History   Socioeconomic History  . Marital status: Single    Spouse name: Not on file  . Number of children: Not on file  . Years of education: Not on file  .  Highest education level: Not on file  Occupational History  . Not on file  Tobacco Use  . Smoking status: Never Smoker  . Smokeless tobacco: Never Used  Substance and Sexual Activity  . Alcohol use: No    Alcohol/week: 0.0 standard drinks  . Drug use: No  . Sexual activity: Not Currently  Other Topics Concern  . Not on file  Social History Narrative  . Not on file   Social  Determinants of Health   Financial Resource Strain:   . Difficulty of Paying Living Expenses:   Food Insecurity:   . Worried About Charity fundraiser in the Last Year:   . Arboriculturist in the Last Year:   Transportation Needs:   . Film/video editor (Medical):   Marland Kitchen Lack of Transportation (Non-Medical):   Physical Activity:   . Days of Exercise per Week:   . Minutes of Exercise per Session:   Stress:   . Feeling of Stress :   Social Connections:   . Frequency of Communication with Friends and Family:   . Frequency of Social Gatherings with Friends and Family:   . Attends Religious Services:   . Active Member of Clubs or Organizations:   . Attends Archivist Meetings:   Marland Kitchen Marital Status:   Intimate Partner Violence:   . Fear of Current or Ex-Partner:   . Emotionally Abused:   Marland Kitchen Physically Abused:   . Sexually Abused:      No Known Allergies   Prior to Admission medications   Medication Sig Start Date End Date Taking? Authorizing Provider  aspirin 81 MG tablet Take 81 mg by mouth daily.   Yes [provider]  [facility] B-12 500 MCG tablet TAKE 1 TABLET (500 MCG TOTAL) BY MOUTH DAILY. 04/30/18  Yes Rutherford Guys, MD  furosemide (LASIX) 20 MG tablet Take 1 tablet (20 mg total) by mouth 2 (two) times daily. 08/22/19  Yes Rutherford Guys, MD  levothyroxine (SYNTHROID) 125 MCG tablet Take 1 tablet (125 mcg total) by mouth daily. 08/23/19  Yes Rutherford Guys, MD  Multiple Vitamin (MULTIVITAMIN WITH MINERALS) TABS Take 1 tablet by mouth daily.   Yes [provider]  olmesartan (BENICAR) 20 MG tablet TAKE 1 TABLET BY MOUTH EVERY DAY Patient taking differently: Taking 1/2 of the pill daily 09/23/19  Yes Rutherford Guys, MD  potassium chloride SA (KLOR-CON) 20 MEQ tablet Take 1 tablet (20 mEq total) by mouth 2 (two) times daily. 08/22/19  Yes Rutherford Guys, MD  rosuvastatin (CRESTOR) 10 MG tablet Take 1 tablet (10 mg total) by mouth daily. 11/16/13  Yes  Roselee Culver, MD  trimethoprim (TRIMPEX) 100 MG tablet  10/04/18  Yes [provider]     Depression screen [facility] 2/9 01/23/2020 12/30/2019 10/31/2019 10/03/2019 07/25/2019  Decreased Interest 0 0 0 0 0  Down, Depressed, Hopeless 0 0 0 0 0  PHQ - 2 Score 0 0 0 0 0     Fall Risk  01/23/2020 12/30/2019 10/31/2019 10/03/2019 07/25/2019  Falls in the past year? 0 0 0 0 1  Number falls in past yr: 0 0 0 0 1  Injury with Fall? 0 0 0 0 1  Risk for fall due to : - - Impaired balance/gait Medication side effect;Impaired mobility;Impaired balance/gait -  Follow up Falls evaluation completed;Education provided Falls evaluation completed - - -      PHYSICAL EXAM: BP 135/82 Comment: not in clinic  Ht 4' 11.5" (1.511 m)   Wt 215 lb (97.5 kg)   BMI 42.70 kg/m    Wt Readings from Last 3 Encounters:  01/23/20 215 lb (97.5 kg)  12/30/19 215 lb (97.5 kg)  11/26/19 229 lb (103.9 kg)    Medicare annual wellness visit, subsequent     Education/Counseling provided regarding diet and exercise, prevention of chronic diseases, smoking/tobacco cessation, if applicable, and reviewed "Covered Medicare Preventive Services."

## 2020-01-23 NOTE — Patient Instructions (Signed)
Thank you for taking time to come for your Medicare Wellness Visit. I appreciate your ongoing commitment to your health goals. Please review the following plan we discussed and let me know if I can assist you in the future.  Edvin Albus LPN  Preventive Care 67 Years and Older, Female Preventive care refers to lifestyle choices and visits with your health care provider that can promote health and wellness. This includes:  A yearly physical exam. This is also called an annual well check.  Regular dental and eye exams.  Immunizations.  Screening for certain conditions.  Healthy lifestyle choices, such as diet and exercise. What can I expect for my preventive care visit? Physical exam Your health care provider will check:  Height and weight. These may be used to calculate body mass index (BMI), which is a measurement that tells if you are at a healthy weight.  Heart rate and blood pressure.  Your skin for abnormal spots. Counseling Your health care provider may ask you questions about:  Alcohol, tobacco, and drug use.  Emotional well-being.  Home and relationship well-being.  Sexual activity.  Eating habits.  History of falls.  Memory and ability to understand (cognition).  Work and work environment.  Pregnancy and menstrual history. What immunizations do I need?  Influenza (flu) vaccine  This is recommended every year. Tetanus, diphtheria, and pertussis (Tdap) vaccine  You may need a Td booster every 10 years. Varicella (chickenpox) vaccine  You may need this vaccine if you have not already been vaccinated. Zoster (shingles) vaccine  You may need this after age 60. Pneumococcal conjugate (PCV13) vaccine  One dose is recommended after age 67. Pneumococcal polysaccharide (PPSV23) vaccine  One dose is recommended after age 67. Measles, mumps, and rubella (MMR) vaccine  You may need at least one dose of MMR if you were born in 1957 or later. You may also  need a second dose. Meningococcal conjugate (MenACWY) vaccine  You may need this if you have certain conditions. Hepatitis A vaccine  You may need this if you have certain conditions or if you travel or work in places where you may be exposed to hepatitis A. Hepatitis B vaccine  You may need this if you have certain conditions or if you travel or work in places where you may be exposed to hepatitis B. Haemophilus influenzae type b (Hib) vaccine  You may need this if you have certain conditions. You may receive vaccines as individual doses or as more than one vaccine together in one shot (combination vaccines). Talk with your health care provider about the risks and benefits of combination vaccines. What tests do I need? Blood tests  Lipid and cholesterol levels. These may be checked every 5 years, or more frequently depending on your overall health.  Hepatitis C test.  Hepatitis B test. Screening  Lung cancer screening. You may have this screening every year starting at age 55 if you have a 30-pack-year history of smoking and currently smoke or have quit within the past 15 years.  Colorectal cancer screening. All adults should have this screening starting at age 50 and continuing until age 75. Your health care provider may recommend screening at age 45 if you are at increased risk. You will have tests every 1-10 years, depending on your results and the type of screening test.  Diabetes screening. This is done by checking your blood sugar (glucose) after you have not eaten for a while (fasting). You may have this done every 1-3   years.  Mammogram. This may be done every 1-2 years. Talk with your health care provider about how often you should have regular mammograms.  BRCA-related cancer screening. This may be done if you have a family history of breast, ovarian, tubal, or peritoneal cancers. Other tests  Sexually transmitted disease (STD) testing.  Bone density scan. This is done  to screen for osteoporosis. You may have this done starting at age 94. Follow these instructions at home: Eating and drinking  Eat a diet that includes fresh fruits and vegetables, whole grains, lean protein, and low-fat dairy products. Limit your intake of foods with high amounts of sugar, saturated fats, and salt.  Take vitamin and mineral supplements as recommended by your health care provider.  Do not drink alcohol if your health care provider tells you not to drink.  If you drink alcohol: ? Limit how much you have to 0-1 drink a day. ? Be aware of how much alcohol is in your drink. In the [place] one drink equals one 12 oz bottle of beer (355 mL), one 5 oz glass of wine (148 mL), or one 1 oz glass of hard liquor (44 mL). Lifestyle  Take daily care of your teeth and gums.  Stay active. Exercise for at least 30 minutes on 5 or more days each week.  Do not use any products that contain nicotine or tobacco, such as cigarettes, e-cigarettes, and chewing tobacco. If you need help quitting, ask your health care provider.  If you are sexually active, practice safe sex. Use a condom or other form of protection in order to prevent STIs (sexually transmitted infections).  Talk with your health care provider about taking a low-dose aspirin or statin. What's next?  Go to your health care provider once a year for a well check visit.  Ask your health care provider how often you should have your eyes and teeth checked.  Stay up to date on all vaccines. This information is not intended to replace advice given to you by your health care provider. Make sure you discuss any questions you have with your health care provider. Document Revised: 08/30/2018 Document Reviewed: 08/30/2018 Elsevier Patient Education  2020 [employer].

## 2020-03-14 ENCOUNTER — Other Ambulatory Visit: Payer: Self-pay | Admitting: Family Medicine

## 2020-03-14 NOTE — Telephone Encounter (Signed)
Requested Prescriptions  Pending Prescriptions Disp Refills  . furosemide (LASIX) 20 MG tablet [Pharmacy Med Name: FUROSEMIDE 20 MG TABLET] 180 tablet 1    Sig: TAKE 1 TABLET BY MOUTH TWICE A DAY     Cardiovascular:  Diuretics - Loop Passed - 03/14/2020  3:11 PM      Passed - K in normal range and within 360 days    Potassium  Date Value Ref Range Status  09/02/2019 4.1 3.5 - 5.2 mmol/L Final         Passed - Ca in normal range and within 360 days    Calcium  Date Value Ref Range Status  09/02/2019 9.4 8.7 - 10.3 mg/dL Final         Passed - Na in normal range and within 360 days    Sodium  Date Value Ref Range Status  09/02/2019 141 134 - 144 mmol/L Final         Passed - Cr in normal range and within 360 days    Creat  Date Value Ref Range Status  04/21/2013 0.52 0.50 - 1.10 mg/dL Final   Creatinine, Ser  Date Value Ref Range Status  09/02/2019 0.88 0.57 - 1.00 mg/dL Final   Creatinine, Urine  Date Value Ref Range Status  01/29/2018 178.04 mg/dL Final    Comment:    Performed at [facility], [place] [address], [place], [place] [address]         Passed - Last BP in normal range    BP Readings from Last 1 Encounters:  01/23/20 135/82         Passed - Valid encounter within last 6 months    Recent Outpatient Visits          1 month ago Medicare annual wellness visit, subsequent   [facility] at [facility], Lilia Argue, MD   2 months ago Controlled type 2 diabetes mellitus without complication, without long-term current use of insulin [facility])   [facility] [place], NP   4 months ago Edema of right lower leg   [facility], Lilia Argue, MD   5 months ago Bilateral lower extremity edema   [facility], Lilia Argue, MD   6 months ago Bilateral lower extremity edema   [facility], Lilia Argue, MD      Future Appointments            In 2 weeks Rutherford Guys, MD [facility] [place], [facility]            . KLOR-CON M20 20 MEQ tablet [Pharmacy Med Name: KLOR-CON M20 TABLET] 180 tablet 1    Sig: TAKE 1 TABLET BY MOUTH TWICE A DAY     Endocrinology:  Minerals - Potassium Supplementation Passed - 03/14/2020  3:11 PM      Passed - K in normal range and within 360 days    Potassium  Date Value Ref Range Status  09/02/2019 4.1 3.5 - 5.2 mmol/L Final         Passed - Cr in normal range and within 360 days    Creat  Date Value Ref Range Status  04/21/2013 0.52 0.50 - 1.10 mg/dL Final   Creatinine, Ser  Date Value Ref Range Status  09/02/2019 0.88 0.57 - 1.00 mg/dL Final   Creatinine, Urine  Date Value Ref Range Status  01/29/2018 178.04 mg/dL Final    Comment:  Performed at [facility], [address], [place], [place] [address]         Passed - Valid encounter within last 12 months    Recent Outpatient Visits          1 month ago Medicare annual wellness visit, subsequent   [facility], Meda Coffee, MD   2 months ago Controlled type 2 diabetes mellitus without complication, without long-term current use of insulin [facility])   [facility], Richard, NP   4 months ago Edema of right lower leg   [facility], Meda Coffee, MD   5 months ago Bilateral lower extremity edema   [facility], Meda Coffee, MD   6 months ago Bilateral lower extremity edema   [facility], Meda Coffee, MD      Future Appointments            In 2 weeks Myles Lipps, MD [facility] at [place], [facility]

## 2020-03-30 ENCOUNTER — Ambulatory Visit (INDEPENDENT_AMBULATORY_CARE_PROVIDER_SITE_OTHER): Payer: Medicare Other | Admitting: Family Medicine

## 2020-03-30 ENCOUNTER — Encounter: Payer: Self-pay | Admitting: Family Medicine

## 2020-03-30 ENCOUNTER — Other Ambulatory Visit: Payer: Self-pay

## 2020-03-30 VITALS — BP 118/73 | HR 92 | Temp 98.7°F | Ht <= 58 in | Wt 203.0 lb

## 2020-03-30 DIAGNOSIS — E039 Hypothyroidism, unspecified: Secondary | ICD-10-CM

## 2020-03-30 DIAGNOSIS — I10 Essential (primary) hypertension: Secondary | ICD-10-CM

## 2020-03-30 DIAGNOSIS — E7800 Pure hypercholesterolemia, unspecified: Secondary | ICD-10-CM | POA: Diagnosis not present

## 2020-03-30 DIAGNOSIS — B372 Candidiasis of skin and nail: Secondary | ICD-10-CM

## 2020-03-30 DIAGNOSIS — I872 Venous insufficiency (chronic) (peripheral): Secondary | ICD-10-CM | POA: Diagnosis not present

## 2020-03-30 DIAGNOSIS — E119 Type 2 diabetes mellitus without complications: Secondary | ICD-10-CM

## 2020-03-30 DIAGNOSIS — R259 Unspecified abnormal involuntary movements: Secondary | ICD-10-CM

## 2020-03-30 LAB — COMPREHENSIVE METABOLIC PANEL
ALT: 18 IU/L (ref 0–32)
AST: 23 IU/L (ref 0–40)
Albumin/Globulin Ratio: 1.3 (ref 1.2–2.2)
Albumin: 4.4 g/dL (ref 3.8–4.8)
Alkaline Phosphatase: 88 IU/L (ref 48–121)
BUN/Creatinine Ratio: 19 (ref 12–28)
BUN: 18 mg/dL (ref 8–27)
Bilirubin Total: 0.4 mg/dL (ref 0.0–1.2)
CO2: 23 mmol/L (ref 20–29)
Calcium: 9.4 mg/dL (ref 8.7–10.3)
Chloride: 105 mmol/L (ref 96–106)
Creatinine, Ser: 0.96 mg/dL (ref 0.57–1.00)
GFR calc Af Amer: 71 mL/min/{1.73_m2} (ref >59)
GFR calc non Af Amer: 61 mL/min/{1.73_m2} (ref >59)
Globulin, Total: 3.3 g/dL (ref 1.5–4.5)
Glucose: 73 mg/dL (ref 65–99)
Potassium: 4.6 mmol/L (ref 3.5–5.2)
Sodium: 142 mmol/L (ref 134–144)
Total Protein: 7.7 g/dL (ref 6.0–8.5)

## 2020-03-30 LAB — LIPID PANEL
Chol/HDL Ratio: 2.5 ratio (ref 0.0–4.4)
Cholesterol, Total: 146 mg/dL (ref 100–199)
HDL: 58 mg/dL (ref >39)
LDL Chol Calc (NIH): 75 mg/dL (ref 0–99)
Triglycerides: 61 mg/dL (ref 0–149)
VLDL Cholesterol Cal: 13 mg/dL (ref 5–40)

## 2020-03-30 LAB — TSH: TSH: 5.3 u[IU]/mL — ABNORMAL HIGH (ref 0.450–4.500)

## 2020-03-30 MED ORDER — NYSTATIN 100000 UNIT/GM EX POWD
1.0000 | Freq: Two times a day (BID) | CUTANEOUS | 0 refills | Status: DC
Start: 2020-03-30 — End: 2020-04-17

## 2020-03-30 MED ORDER — CIPROFLOXACIN HCL 750 MG PO TABS
750.0000 mg | ORAL_TABLET | Freq: Two times a day (BID) | ORAL | 0 refills | Status: DC
Start: 2020-03-30 — End: 2020-04-17

## 2020-03-30 NOTE — Progress Notes (Signed)
7/12/202110:57 AM  Anna Bernard 1951-04-23, 69 y.o., female [id number]  Chief Complaint  Patient presents with  . right leg edema    f.u     HPI:   Patient is a 69 y.o. female with past medical history significant for dizziness,HTN, HLP,diabetes,microcystic anemia,vitamin B12 deficiencyandhypothyroidismwho presents today for routine followup  Last OV feb 2021 - ref to vasc surg  Saw Dr Chestine Spore March 2021 - deep venous insufficiency +/- lymphedema (RLE), no surgical interventions available, measured for compression stockings  Patient reports that she is doing ok/not well Her right leg cont to be swollen, she is not wearing compression stocking daily, she does not notice that is better when she wakes up Her husband as noticed decline, she is moving slower, needing more assistance with ADLs  Lab Results  Component Value Date   HGBA1C 6.3 (A) 12/30/2019   HGBA1C 6.2 (H) 06/27/2019   HGBA1C 5.7 (H) 05/29/2018   Lab Results  Component Value Date   MICROALBUR 4.98 (H) 04/21/2013   LDLCALC 83 06/27/2019   CREATININE 0.88 09/02/2019   Lab Results  Component Value Date   TSH 5.710 (H) 12/30/2019    Depression screen PHQ 2/9 01/23/2020 12/30/2019 10/31/2019  Decreased Interest 0 0 0  Down, Depressed, Hopeless 0 0 0  PHQ - 2 Score 0 0 0    Fall Risk  03/30/2020 01/23/2020 12/30/2019 10/31/2019 10/03/2019  Falls in the past year? 0 0 0 0 0  Number falls in past yr: 0 0 0 0 0  Injury with Fall? 0 0 0 0 0  Risk for fall due to : - - - Impaired balance/gait Medication side effect;Impaired mobility;Impaired balance/gait  Follow up Falls evaluation completed Falls evaluation completed;Education provided Falls evaluation completed - -     No Known Allergies  Prior to Admission medications   Medication Sig Start Date End Date Taking? Authorizing Provider  aspirin 81 MG tablet Take 81 mg by mouth daily.   Yes [provider]  [employer] B-12 500 MCG tablet TAKE 1 TABLET (500 MCG  TOTAL) BY MOUTH DAILY. 04/30/18  Yes Myles Lipps, MD  furosemide (LASIX) 20 MG tablet TAKE 1 TABLET BY MOUTH TWICE A DAY 03/14/20  Yes Myles Lipps, MD  KLOR-CON M20 20 MEQ tablet TAKE 1 TABLET BY MOUTH TWICE A DAY 03/14/20  Yes Myles Lipps, MD  levothyroxine (SYNTHROID) 125 MCG tablet Take 1 tablet (125 mcg total) by mouth daily. 08/23/19  Yes Myles Lipps, MD  Multiple Vitamin (MULTIVITAMIN WITH MINERALS) TABS Take 1 tablet by mouth daily.   Yes [provider]  olmesartan (BENICAR) 20 MG tablet TAKE 1 TABLET BY MOUTH EVERY DAY Patient taking differently: Taking 1/2 of the pill daily 09/23/19  Yes Myles Lipps, MD  rosuvastatin (CRESTOR) 10 MG tablet Take 1 tablet (10 mg total) by mouth daily. 11/16/13  Yes Carmelina Dane, MD  trimethoprim (TRIMPEX) 100 MG tablet  10/04/18  Yes [provider]    Past Medical History:  Diagnosis Date  . Anemia, unspecified   . Hyperlipidemia, unspecified   . Hypertension   . Hypothyroidism, unspecified   . Pre-diabetes   . Vitamin B12 deficiency     Past Surgical History:  Procedure Laterality Date  . ABDOMINAL HYSTERECTOMY    . GIVENS CAPSULE STUDY N/A 04/16/2018   Procedure: GIVENS CAPSULE STUDY;  Surgeon: Jeani Hawking, MD;  Location: [facility] END[facility];  Service: Endoscopy;  Laterality: N/A;  . SHOULDER SURGERY  Social History   Tobacco Use  . Smoking status: Never Smoker  . Smokeless tobacco: Never Used  Substance Use Topics  . Alcohol use: No    Alcohol/week: 0.0 standard drinks    No family history on file.  Review of Systems  Constitutional: Negative for chills and fever.  Respiratory: Negative for cough and shortness of breath.   Cardiovascular: Negative for chest pain, palpitations and leg swelling.  Gastrointestinal: Negative for abdominal pain, nausea and vomiting.     OBJECTIVE:  Today's Vitals   03/30/20 1043  BP: 118/73  Pulse: 92  Temp: 98.7 F (37.1 C)  TempSrc: Temporal    SpO2: 96%  Weight: 203 lb (92.1 kg)  Height: 4\' 10"  (1.473 m)   Body mass index is 42.43 kg/m.   Wt Readings from Last 3 Encounters:  03/30/20 203 lb (92.1 kg)  01/23/20 215 lb (97.5 kg)  12/30/19 215 lb (97.5 kg)     Physical Exam Vitals and nursing note reviewed.  Constitutional:      Appearance: She is well-developed.  HENT:     Head: Normocephalic and atraumatic.     Mouth/Throat:     Pharynx: No oropharyngeal exudate.  Eyes:     General: No scleral icterus.    Conjunctiva/sclera: Conjunctivae normal.     Pupils: Pupils are equal, round, and reactive to light.  Cardiovascular:     Rate and Rhythm: Normal rate and regular rhythm.     Heart sounds: Normal heart sounds. No murmur heard.  No friction rub. No gallop.   Pulmonary:     Effort: Pulmonary effort is normal.     Breath sounds: Normal breath sounds. No wheezing, rhonchi or rales.  Musculoskeletal:     Cervical back: Neck supple.     Right lower leg: Edema present.  Skin:    General: Skin is warm and dry.     Findings: Rash (right side underneath pannus towards groin mosit erythematous patches with overlying greenish malodorous discharge) present.  Neurological:     Mental Status: She is alert and oriented to person, place, and time.     Cranial Nerves: Cranial nerves are intact.     Motor: Tremor (intermittent, at rest) and abnormal muscle tone present. No weakness.     Gait: Gait abnormal.     Deep Tendon Reflexes: Reflexes normal.     No results found for this or any previous visit (from the past 24 hour(s)).  No results found.   ASSESSMENT and PLAN  1. Hypothyroidism, unspecified type Checking labs today, medications will be adjusted as needed.  - TSH  2. Pure hypercholesterolemia Checking labs today, medications will be adjusted as needed.  - Comprehensive metabolic panel - Lipid panel  3. Essential hypertension Controlled. Continue current regime.   4. Chronic venous  insufficiency Elevation, compression stocking  5. Controlled type 2 diabetes mellitus without complication, without long-term current use of insulin (HCC) - Ambulatory referral to Podiatry  6. Parkinsonian features - Ambulatory referral to Neurology  7. Candidiasis, intertrigo Nystatin powder, concern for superimposed pseudomonas skin infection, rx cipro given. Reviewed r/se/b. RTC precautions given  Other orders - ciprofloxacin (CIPRO) 750 MG tablet; Take 1 tablet (750 mg total) by mouth 2 (two) times daily. - nystatin (MYCOSTATIN/NYSTOP) powder; Apply 1 application topically 2 (two) times daily.  Return in about 2 weeks (around 04/13/2020) for skin infection.    04/15/2020, MD Primary Care at [facility] [address] [place], [place] Ph.  [phone]  Fax [phone]

## 2020-03-30 NOTE — Patient Instructions (Signed)
° ° ° °  If you have lab work done today you will be contacted with your lab results within the next 2 weeks.  If you have not heard from us then please contact us. The fastest way to get your results is to register for My Chart. ° ° °IF you received an x-ray today, you will receive an invoice from [facility]. Please contact [facility] at [phone] with questions or concerns regarding your invoice.  ° °IF you received labwork today, you will receive an invoice from [facility]. Please contact [facility] at [phone] with questions or concerns regarding your invoice.  ° °Our billing staff will not be able to assist you with questions regarding bills from these companies. ° °You will be contacted with the lab results as soon as they are available. The fastest way to get your results is to activate your My Chart account. Instructions are located on the last page of this paperwork. If you have not heard from us regarding the results in 2 weeks, please contact this office. °  ° ° ° °

## 2020-04-08 ENCOUNTER — Encounter: Payer: Self-pay | Admitting: Neurology

## 2020-04-16 ENCOUNTER — Other Ambulatory Visit: Payer: Self-pay | Admitting: Family Medicine

## 2020-04-16 DIAGNOSIS — E079 Disorder of thyroid, unspecified: Secondary | ICD-10-CM

## 2020-04-16 MED ORDER — LEVOTHYROXINE SODIUM 150 MCG PO TABS: 150.0000 ug | ORAL_TABLET | Freq: Every day | ORAL | 1 refills | Status: DC

## 2020-04-17 ENCOUNTER — Ambulatory Visit (INDEPENDENT_AMBULATORY_CARE_PROVIDER_SITE_OTHER): Payer: Medicare Other | Admitting: Family Medicine

## 2020-04-17 ENCOUNTER — Other Ambulatory Visit: Payer: Self-pay

## 2020-04-17 ENCOUNTER — Encounter: Payer: Self-pay | Admitting: Family Medicine

## 2020-04-17 VITALS — BP 118/67 | HR 87 | Temp 97.6°F | Wt 199.0 lb

## 2020-04-17 DIAGNOSIS — B372 Candidiasis of skin and nail: Secondary | ICD-10-CM

## 2020-04-17 DIAGNOSIS — E039 Hypothyroidism, unspecified: Secondary | ICD-10-CM

## 2020-04-17 MED ORDER — NYSTATIN 100000 UNIT/GM EX POWD: 1.0000 "application " | Freq: Two times a day (BID) | CUTANEOUS | 5 refills | Status: DC

## 2020-04-17 NOTE — Progress Notes (Signed)
7/30/202111:11 AM  Anna Bernard 10-13-1950, 69 y.o., female [id number]  Chief Complaint  Patient presents with  . Follow-up    x2 weeks skin infection    HPI:   Patient is a 69 y.o. female with past medical history significant for dizziness,HTN, HLP,diabetes,microcystic anemia,vitamin B12 deficiencyandhypothyroidismwho presents today for routine followup  Last OV 2 weeks ago - started cipro, referred to neurology and podiatry (appts have been scheduled)  Skin infection sign improved Completed abx wo issues Smell and drainage resolved  Has not started her higher dose levothyroxine yet  Depression screen [facility] 2/9 04/17/2020 01/23/2020 12/30/2019  Decreased Interest 0 0 0  Down, Depressed, Hopeless 0 0 0  PHQ - 2 Score 0 0 0    Fall Risk  04/17/2020 03/30/2020 01/23/2020 12/30/2019 10/31/2019  Falls in the past year? 0 0 0 0 0  Number falls in past yr: 0 0 0 0 0  Injury with Fall? 0 0 0 0 0  Risk for fall due to : - - - - Impaired balance/gait  Follow up Falls evaluation completed Falls evaluation completed Falls evaluation completed;Education provided Falls evaluation completed -     No Known Allergies  Prior to Admission medications   Medication Sig Start Date End Date Taking? Authorizing Provider  aspirin 81 MG tablet Take 81 mg by mouth daily.   Yes [provider]  ciprofloxacin (CIPRO) 750 MG tablet Take 1 tablet (750 mg total) by mouth 2 (two) times daily. 03/30/20  Yes Myles Lipps, MD  [employer]S B-12 500 MCG tablet TAKE 1 TABLET (500 MCG TOTAL) BY MOUTH DAILY. 04/30/18  Yes Myles Lipps, MD  furosemide (LASIX) 20 MG tablet TAKE 1 TABLET BY MOUTH TWICE A DAY 03/14/20  Yes Myles Lipps, MD  KLOR-CON M20 20 MEQ tablet TAKE 1 TABLET BY MOUTH TWICE A DAY 03/14/20  Yes Myles Lipps, MD  levothyroxine (SYNTHROID) 150 MCG tablet Take 1 tablet (150 mcg total) by mouth daily. 04/16/20  Yes Myles Lipps, MD  Multiple Vitamin (MULTIVITAMIN WITH MINERALS)  TABS Take 1 tablet by mouth daily.   Yes [provider]  nystatin (MYCOSTATIN/NYSTOP) powder Apply 1 application topically 2 (two) times daily. 03/30/20  Yes Myles Lipps, MD  olmesartan (BENICAR) 20 MG tablet TAKE 1 TABLET BY MOUTH EVERY DAY Patient taking differently: Taking 1/2 of the pill daily 09/23/19  Yes Myles Lipps, MD  rosuvastatin (CRESTOR) 10 MG tablet Take 1 tablet (10 mg total) by mouth daily. 11/16/13  Yes Carmelina Dane, MD  trimethoprim (TRIMPEX) 100 MG tablet  10/04/18  Yes [provider]    Past Medical History:  Diagnosis Date  . Anemia, unspecified   . Hyperlipidemia, unspecified   . Hypertension   . Hypothyroidism, unspecified   . Pre-diabetes   . Vitamin B12 deficiency     Past Surgical History:  Procedure Laterality Date  . ABDOMINAL HYSTERECTOMY    . GIVENS CAPSULE STUDY N/A 04/16/2018   Procedure: GIVENS CAPSULE STUDY;  Surgeon: Jeani Hawking, MD;  Location: [facility] ENDOSCOPY;  Service: Endoscopy;  Laterality: N/A;  . SHOULDER SURGERY      Social History   Tobacco Use  . Smoking status: Never Smoker  . Smokeless tobacco: Never Used  Substance Use Topics  . Alcohol use: No    Alcohol/week: 0.0 standard drinks    No family history on file.  ROS Per hpi  OBJECTIVE:  Today's Vitals   04/17/20 1104  BP:  118/67  Pulse: 87  Temp: 97.6 F (36.4 C)  TempSrc: Temporal  SpO2: 95%  Weight: 199 lb (90.3 kg)   Body mass index is 41.59 kg/m.   Wt Readings from Last 3 Encounters:  04/17/20 199 lb (90.3 kg)  03/30/20 203 lb (92.1 kg)  01/23/20 215 lb (97.5 kg)     Physical Exam   Gen: AAOx3, NAD Skin: rash and infection underneath pannus and groin area resolved  No results found for this or any previous visit (from the past 24 hour(s)).  No results found.   ASSESSMENT and PLAN  1. Candidiasis, intertrigo Resolved. Cont with supportive measures. Nystatin prn  2. Hypothyroidism, unspecified type Start new  dose, recheck labs at next OV  Other orders - nystatin (MYCOSTATIN/NYSTOP) powder; Apply 1 application topically 2 (two) times daily.  Return in about 2 months (around 06/18/2020) for thyroid, after neuro/podiatry.    Myles Lipps, MD Primary Care at [facility] [address] [place], [place] [address] Ph.  [phone] Fax [phone]

## 2020-04-17 NOTE — Patient Instructions (Signed)
° ° ° °  If you have lab work done today you will be contacted with your lab results within the next 2 weeks.  If you have not heard from us then please contact us. The fastest way to get your results is to register for My Chart. ° ° °IF you received an x-ray today, you will receive an invoice from [facility]. Please contact [facility] at [phone] with questions or concerns regarding your invoice.  ° °IF you received labwork today, you will receive an invoice from [facility]. Please contact [facility] at [phone] with questions or concerns regarding your invoice.  ° °Our billing staff will not be able to assist you with questions regarding bills from these companies. ° °You will be contacted with the lab results as soon as they are available. The fastest way to get your results is to activate your My Chart account. Instructions are located on the last page of this paperwork. If you have not heard from us regarding the results in 2 weeks, please contact this office. °  ° ° ° °

## 2020-04-21 NOTE — Progress Notes (Signed)
Assessment/Plan:    1. Idiopathic Parkinson's disease.  The patient has  bradykinesia, rigidity and postural instability.  -We discussed the diagnosis as well as pathophysiology of the disease.  We discussed treatment options as well as prognostic indicators.  Patient education was provided.  -We discussed that it used to be thought that levodopa would increase risk of melanoma but now it is believed that Parkinsons itself likely increases risk of melanoma. she is to get regular skin checks.  -  We talked about medication options as well as potential future surgical options.  We talked about safety in the home.  -We decided to add carbidopa/levodopa 25/100.  1/2 tab tid x 1 wk, then 1/2 in am & noon & 1 at night for a week, then 1/2 in am &1 at noon &night for a week, then 1 po tid.  Risks, benefits, side effects and alternative therapies were discussed.  The opportunity to ask questions was given and they were answered to the best of my ability.  The patient expressed understanding and willingness to follow the outlined treatment protocols.  -I will refer the patient to the Parkinson's program at the neurorehabilitation Center, for PT/OT.  We talked about the importance of safe, cardiovascular exercise in Parkinson's disease.  -We discussed community resources in the area including patient support groups and community exercise programs for PD and pt education was provided to the patient.   2.  Follow up is anticipated in the next 4-6 months, sooner should new neurologic issues arise.   Subjective:   Anna Bernard was seen today in the movement disorders clinic for neurologic consultation at the request of Myles Lipps, MD.  The consultation is for the evaluation of rest tremor and r/o Parkinsons Disease.  Outside records that were made available to me were reviewed.  This patient is accompanied in the office by her spouse who supplements the history.  Specific Symptoms:  Tremor: Yes.   , pt states that she doesn't shake.  Husband states that feet shake and stick to the ground.  No other tremor.  Sx's x 1 year.   Family hx of similar:  No. Voice: softer voice but pt thinks just b/c she doesn't work any longer Sleep: pt states that sleeps well; husband denies this  Vivid Dreams:  No.  Acting out dreams:  No. Wet Pillows: Yes.   Postural symptoms:  Yes.    Falls?  No. Bradykinesia symptoms: shuffling gait, slow movements, difficulty getting out of a chair and difficulty regaining balance; walker x 1 year Loss of smell:  No. Loss of taste:  No. Urinary Incontinence:  Yes.   - attributes to her medicine Difficulty Swallowing:  No. Handwriting, micrographia: unknown - but "its not legible" Trouble with ADL's:  No.  Trouble buttoning clothing: No. Depression:  No. Memory changes:  No. Hallucinations:  No.  visual distortions: No. N/V:  No. Lightheaded:  No.  Syncope: No. Diplopia:  No. Dyskinesia:  No. Prior exposure to reglan/antipsychotics: No.  Neuroimaging of the brain has previously been performed.  It is available for my review today.  MRI brain completed in July, 2019 after dizziness and multiple falls.  There are a moderate number of T2 hyperintensities.  PREVIOUS MEDICATIONS: none to date  ALLERGIES:  No Known Allergies  CURRENT MEDICATIONS:  Current Outpatient Medications  Medication Instructions  . aspirin 81 mg, Oral, Daily  . carbidopa-levodopa (SINEMET IR) 25-100 MG tablet 1 tablet, Oral, 3 times daily, 7am/11am/4pm  .  CVS B-12 500 MCG tablet TAKE 1 TABLET (500 MCG TOTAL) BY MOUTH DAILY.  . furosemide (LASIX) 20 MG tablet TAKE 1 TABLET BY MOUTH TWICE A DAY  . KLOR-CON M20 20 MEQ tablet TAKE 1 TABLET BY MOUTH TWICE A DAY  . levothyroxine (SYNTHROID) 150 mcg, Oral, Daily  . Multiple Vitamin (MULTIVITAMIN WITH MINERALS) TABS 1 tablet, Oral, Daily  . nystatin (MYCOSTATIN/NYSTOP) powder 1 application, Topical, 2 times daily  . olmesartan (BENICAR) 20  MG tablet TAKE 1 TABLET BY MOUTH EVERY DAY  . rosuvastatin (CRESTOR) 10 mg, Oral, Daily  . trimethoprim (TRIMPEX) 100 MG tablet No dose, route, or frequency recorded.    Objective:   VITALS:   Vitals:   04/23/20 1000  BP: (!) 149/75  Pulse: 100  SpO2: 96%  Weight: 199 lb (90.3 kg)  Height: 5\' 1"  (1.549 m)    GEN:  The patient appears stated age and is in NAD. HEENT:  Normocephalic, atraumatic.  The mucous membranes are moist. The superficial temporal arteries are without ropiness or tenderness. CV:  RRR Lungs:  CTAB Neck/HEME:  There are no carotid bruits bilaterally.  Neurological examination:  Orientation: The patient is alert and oriented x3.  Cranial nerves: There is good facial symmetry.  There is facial hypomimia.   Extraocular muscles are intact. The visual fields are full to confrontational testing. The speech is fluent and clear. She is hypophonic.  Soft palate rises symmetrically and there is no tongue deviation. Hearing is intact to conversational tone. Sensation: Sensation is intact to light and pinprick throughout (facial, trunk, extremities). Vibration is intact at the bilateral big toe. There is no extinction with double simultaneous stimulation. There is no sensory dermatomal level identified. Motor: Strength is 5/5 in the bilateral upper and lower extremities.   Shoulder shrug is equal and symmetric.  There is no pronator drift. Deep tendon reflexes: Deep tendon reflexes are 2/4 at the bilateral biceps, triceps, brachioradialis, patella and achilles. Plantar responses are downgoing bilaterally.  Movement examination: Tone: There is mild increased tone in the RUE Abnormal movements: none Coordination:  There is decremation with RAM's, with any form of RAMS, including alternating supination and pronation of the forearm, hand opening and closing, finger taps, heel taps and toe taps, R>L Gait and Station: The patient requires 2 person assist out of the chair.  She is  given a walker.  Has start hesitation.  Is slow to ambulate with short, shuffling steps.  I have reviewed and interpreted the following labs independently   Chemistry      Component Value Date/Time   NA 142 03/30/2020 1217   K 4.6 03/30/2020 1217   CL 105 03/30/2020 1217   CO2 23 03/30/2020 1217   BUN 18 03/30/2020 1217   CREATININE 0.96 03/30/2020 1217   CREATININE 0.52 04/21/2013 1526   GLU 133 08/27/2012 0000      Component Value Date/Time   CALCIUM 9.4 03/30/2020 1217   ALKPHOS 88 03/30/2020 1217   AST 23 03/30/2020 1217   ALT 18 03/30/2020 1217   BILITOT 0.4 03/30/2020 1217      Lab Results  Component Value Date   TSH 5.300 (H) 03/30/2020   Lab Results  Component Value Date   WBC 5.4 12/30/2019   HGB 10.3 (L) 12/30/2019   HCT 31.7 (L) 12/30/2019   MCV 79 12/30/2019   PLT 319 12/30/2019     Total time spent on today's visit was 60 minutes, including both face-to-face time and nonface-to-face time.  Time included that spent on review of records (prior notes available to me/labs/imaging if pertinent), discussing treatment and goals, answering patient's questions and coordinating care.  Cc:  Myles Lipps, MD

## 2020-04-23 ENCOUNTER — Encounter: Payer: Self-pay | Admitting: Neurology

## 2020-04-23 ENCOUNTER — Ambulatory Visit: Payer: Medicare Other | Admitting: Neurology

## 2020-04-23 ENCOUNTER — Other Ambulatory Visit: Payer: Self-pay

## 2020-04-23 VITALS — BP 149/75 | HR 100 | Ht 61.0 in | Wt 199.0 lb

## 2020-04-23 DIAGNOSIS — G20 Parkinson's disease: Secondary | ICD-10-CM

## 2020-04-23 DIAGNOSIS — G20A1 Parkinson's disease without dyskinesia, without mention of fluctuations: Secondary | ICD-10-CM | POA: Insufficient documentation

## 2020-04-23 MED ORDER — CARBIDOPA-LEVODOPA 25-100 MG PO TABS: 1.0000 | ORAL_TABLET | Freq: Three times a day (TID) | ORAL | 1 refills | Status: DC

## 2020-04-23 NOTE — Patient Instructions (Addendum)
Start Carbidopa Levodopa as follows:  Take 1/2 tablet three times daily, at least 30 minutes before meals (approximately 7am/11am/4pm), for one week  Then take 1/2 tablet in the morning, 1/2 tablet in the afternoon, 1 tablet in the evening, at least 30 minutes before meals, for one week  Then take 1/2 tablet in the morning, 1 tablet in the afternoon, 1 tablet in the evening, at least 30 minutes before meals, for one week  Then take 1 tablet three times daily at 7am/11am/4pm, at least 30 minutes before meals   As a reminder, carbidopa/levodopa can be taken at the same time as a carbohydrate, but we like to have you take your pill either 30 minutes before a protein source or 1 hour after as protein can interfere with carbidopa/levodopa absorption.  You have been referred to Neuro Rehab for therapy. They will call you directly to schedule an appointment.  Please call [phone] if you do not hear from them.    The physicians and staff at [facility] are committed to providing excellent care. You may receive a survey requesting feedback about your experience at our office. We strive to receive "very good" responses to the survey questions. If you feel that your experience would prevent you from giving the office a "very good " response, please contact our office to try to remedy the situation. We may be reached at [phone]. Thank you for taking the time out of your busy day to complete the survey.  

## 2020-06-17 ENCOUNTER — Other Ambulatory Visit: Payer: Self-pay

## 2020-06-17 ENCOUNTER — Ambulatory Visit: Payer: Medicare Other | Admitting: Podiatry

## 2020-06-17 ENCOUNTER — Encounter: Payer: Self-pay | Admitting: Podiatry

## 2020-06-17 DIAGNOSIS — I872 Venous insufficiency (chronic) (peripheral): Secondary | ICD-10-CM

## 2020-06-17 DIAGNOSIS — B351 Tinea unguium: Secondary | ICD-10-CM | POA: Diagnosis not present

## 2020-06-17 DIAGNOSIS — M79675 Pain in left toe(s): Secondary | ICD-10-CM | POA: Diagnosis not present

## 2020-06-17 DIAGNOSIS — M79674 Pain in right toe(s): Secondary | ICD-10-CM

## 2020-06-17 DIAGNOSIS — E119 Type 2 diabetes mellitus without complications: Secondary | ICD-10-CM

## 2020-06-17 DIAGNOSIS — N289 Disorder of kidney and ureter, unspecified: Secondary | ICD-10-CM | POA: Diagnosis not present

## 2020-06-17 NOTE — Progress Notes (Signed)
This patient returns to my office for at risk foot care.  This patient requires this care by a professional since this patient will be at risk due to having diabetes, chronic kidney disease.   This patient is unable to cut nails herself since the patient cannot reach her nails.These nails are painful walking and wearing shoes.  This patient presents for at risk foot care today.  General Appearance  Alert, conversant and in no acute stress.  Vascular  Dorsalis pedis pulses are palpable  bilaterally. Posterior tibial pulses are absent. Capillary return is within normal limits  bilaterally. Temperature is within normal limits  bilaterally.  Neurologic  Senn-Weinstein monofilament wire test within normal limits left foot.  LOPS right absent.. Muscle power within normal limits bilaterally.  Nails Thick disfigured discolored nails with subungual debris  from hallux to fifth toes bilaterally. No evidence of bacterial infection or drainage bilaterally.  Orthopedic  No limitations of motion  feet .  No crepitus or effusions noted.  No bony pathology or digital deformities noted. PTTD right foot.  DJD right reardoot medially.  Skin  normotropic skin with no porokeratosis noted bilaterally.  No signs of infections or ulcers noted.     Onychomycosis  Pain in right toes  Pain in left toes  Consent was obtained for treatment procedures.   Mechanical debridement of nails 1-5  bilaterally performed with a nail nipper.  Filed with dremel without incident. Diabetic foot exam was performed.   Return office visit  3 months                    Told patient to return for periodic foot care and evaluation due to potential at risk complications.   Helane Gunther DPM

## 2020-06-18 ENCOUNTER — Ambulatory Visit (INDEPENDENT_AMBULATORY_CARE_PROVIDER_SITE_OTHER): Payer: Medicare Other | Admitting: Family Medicine

## 2020-06-18 ENCOUNTER — Encounter: Payer: Self-pay | Admitting: Family Medicine

## 2020-06-18 VITALS — BP 138/76 | HR 78 | Temp 98.4°F | Ht 61.0 in | Wt 188.8 lb

## 2020-06-18 DIAGNOSIS — E785 Hyperlipidemia, unspecified: Secondary | ICD-10-CM

## 2020-06-18 DIAGNOSIS — Z23 Encounter for immunization: Secondary | ICD-10-CM

## 2020-06-18 DIAGNOSIS — I872 Venous insufficiency (chronic) (peripheral): Secondary | ICD-10-CM

## 2020-06-18 DIAGNOSIS — E039 Hypothyroidism, unspecified: Secondary | ICD-10-CM | POA: Diagnosis not present

## 2020-06-18 DIAGNOSIS — G20 Parkinson's disease: Secondary | ICD-10-CM

## 2020-06-18 DIAGNOSIS — I10 Essential (primary) hypertension: Secondary | ICD-10-CM | POA: Diagnosis not present

## 2020-06-18 MED ORDER — OLMESARTAN MEDOXOMIL 5 MG PO TABS
10.0000 mg | ORAL_TABLET | Freq: Every day | ORAL | 1 refills | Status: DC
Start: 2020-06-18 — End: 2020-09-10

## 2020-06-18 MED ORDER — ROSUVASTATIN CALCIUM 10 MG PO TABS: 10.0000 mg | ORAL_TABLET | Freq: Every day | ORAL | 1 refills | Status: DC

## 2020-06-18 NOTE — Patient Instructions (Addendum)
Citrucel powder daily for constipation    If you have lab work done today you will be contacted with your lab results within the next 2 weeks.  If you have not heard from [place] then please contact us. The fastest way to get your results is to register for My Chart.   IF you received an x-ray today, you will receive an invoice from [facility]. Please contact [facility] at [phone] with questions or concerns regarding your invoice.   IF you received labwork today, you will receive an invoice from [facility]. Please contact [facility] at [phone] with questions or concerns regarding your invoice.   Our billing staff will not be able to assist you with questions regarding bills from these companies.  You will be contacted with the lab results as soon as they are available. The fastest way to get your results is to activate your My Chart account. Instructions are located on the last page of this paperwork. If you have not heard from [place] regarding the results in 2 weeks, please contact this office.

## 2020-06-18 NOTE — Progress Notes (Signed)
9/30/202111:36 AM  Anna Bernard [birth year], 69 y.o., female [id number]  Chief Complaint  Patient presents with  . Follow-up    x47mos Hypothyroidism, unspecified type    HPI:   Patient is a 69 y.o. female with past medical history significant for parkisons, dizziness,HTN, HLP,diabetes,microcystic anemia,vitamin B12 deficiencyandhypothyroidismwho presents today for routine followup  Last OV July 2021 - increase levo, referred to neuro for parkinsons eval  Saw Dr Arlana Pouch - started sinemet, referred to neurorehab  She is doing better now that she has been dx with parkinson "foot is not getting stuck anymore" She is having issues with constipation Takes take ducolax and magnesium prn - too aggressive Patient takes 1/2 tab olmesartan (10mg ) daily, cutting tab is hard  Checks BP at home < 140/90 She is taking new dose of levo wo issues She has not been using her compression stockings as diligently as before  Lab Results  Component Value Date   CHOL 146 03/30/2020   HDL 58 03/30/2020   LDLCALC 75 03/30/2020   TRIG 61 03/30/2020   CHOLHDL 2.5 03/30/2020    Lab Results  Component Value Date   TSH 5.300 (H) 03/30/2020    Depression screen PHQ 2/9 06/18/2020 04/17/2020 01/23/2020  Decreased Interest 0 0 0  Down, Depressed, Hopeless 0 0 0  PHQ - 2 Score 0 0 0    Fall Risk  06/18/2020 04/23/2020 04/17/2020 03/30/2020 01/23/2020  Falls in the past year? 0 0 0 0 0  Number falls in past yr: 0 0 0 0 0  Injury with Fall? 0 0 0 0 0  Risk for fall due to : - - - - -  Follow up Falls evaluation completed - Falls evaluation completed Falls evaluation completed Falls evaluation completed;Education provided     No Known Allergies  Prior to Admission medications   Medication Sig Start Date End Date Taking? Authorizing Provider  aspirin 81 MG tablet Take 81 mg by mouth daily.   Yes [provider]  carbidopa-levodopa (SINEMET IR) 25-100 MG tablet Take 1 tablet by mouth 3  (three) times daily. 7am/11am/4pm 04/23/20  Yes Tat, 06/23/20, DO  [facility] B-12 500 MCG tablet TAKE 1 TABLET (500 MCG TOTAL) BY MOUTH DAILY. 04/30/18  Yes 06/30/18, MD  furosemide (LASIX) 20 MG tablet TAKE 1 TABLET BY MOUTH TWICE A DAY 03/14/20  Yes 03/16/20, MD  KLOR-CON M20 20 MEQ tablet TAKE 1 TABLET BY MOUTH TWICE A DAY 03/14/20  Yes 03/16/20, MD  levothyroxine (SYNTHROID) 150 MCG tablet Take 1 tablet (150 mcg total) by mouth daily. 04/16/20  Yes 04/18/20, MD  Multiple Vitamin (MULTIVITAMIN WITH MINERALS) TABS Take 1 tablet by mouth daily.   Yes [provider]  nystatin (MYCOSTATIN/NYSTOP) powder Apply 1 application topically 2 (two) times daily. 04/17/20  Yes 04/19/20, MD  olmesartan (BENICAR) 20 MG tablet TAKE 1 TABLET BY MOUTH EVERY DAY Patient taking differently: Taking 1/2 of the pill daily 09/23/19  Yes 11/21/19, MD  rosuvastatin (CRESTOR) 10 MG tablet Take 1 tablet (10 mg total) by mouth daily. 11/16/13  Yes 11/18/13, MD  trimethoprim (TRIMPEX) 100 MG tablet  10/04/18  Yes [provider]    Past Medical History:  Diagnosis Date  . Anemia, unspecified   . Hyperlipidemia, unspecified   . Hypertension   . Hypothyroidism, unspecified   . Pre-diabetes   . Vitamin B12 deficiency     Past Surgical  History:  Procedure Laterality Date  . ABDOMINAL HYSTERECTOMY    . GIVENS CAPSULE STUDY N/A 04/16/2018   Procedure: GIVENS CAPSULE STUDY;  Surgeon: Jeani Hawking, MD;  Location: [facility] ENDOSCOPY;  Service: Endoscopy;  Laterality: N/A;  . SHOULDER SURGERY      Social History   Tobacco Use  . Smoking status: Former Games developer  . Smokeless tobacco: Never Used  . Tobacco comment: quit 30+ years ago  Substance Use Topics  . Alcohol use: No    Alcohol/week: 0.0 standard drinks    Family History  Problem Relation Age of Onset  . Renal Disease Sister   . Cancer Sister     Review of Systems  Constitutional: Negative for  chills and fever.  Respiratory: Negative for cough and shortness of breath.   Cardiovascular: Negative for chest pain, palpitations and leg swelling.  Gastrointestinal: Negative for abdominal pain, nausea and vomiting.     OBJECTIVE:  Today's Vitals   06/18/20 1115 06/18/20 1155  BP: (!) 147/75 138/76  Pulse: 78   Temp: 98.4 F (36.9 C)   TempSrc: Temporal   SpO2: 95%   Weight: 188 lb 12.8 oz (85.6 kg)   Height: 5\' 1"  (1.549 m)    Body mass index is 35.67 kg/m.  BP Readings from Last 3 Encounters:  06/18/20 (!) 147/75  04/23/20 (!) 149/75  04/17/20 118/67    Physical Exam Vitals and nursing note reviewed.  Constitutional:      Appearance: She is well-developed.  HENT:     Head: Normocephalic and atraumatic.     Mouth/Throat:     Pharynx: No oropharyngeal exudate.  Eyes:     General: No scleral icterus.    Extraocular Movements: Extraocular movements intact.     Conjunctiva/sclera: Conjunctivae normal.     Pupils: Pupils are equal, round, and reactive to light.  Cardiovascular:     Rate and Rhythm: Normal rate and regular rhythm.     Heart sounds: Normal heart sounds. No murmur heard.  No friction rub. No gallop.   Pulmonary:     Effort: Pulmonary effort is normal.     Breath sounds: Normal breath sounds. No wheezing, rhonchi or rales.  Musculoskeletal:     Cervical back: Neck supple.  Skin:    General: Skin is warm and dry.  Neurological:     Mental Status: She is alert and oriented to person, place, and time.     No results found for this or any previous visit (from the past 24 hour(s)).  No results found.   ASSESSMENT and PLAN  1. Hypothyroidism, unspecified type Checking labs today, medications will be adjusted as needed.  - TSH  2. Hyperlipidemia Controlled. Continue current regime.  - rosuvastatin (CRESTOR) 10 MG tablet; Take 1 tablet (10 mg total) by mouth daily.  3. Need for prophylactic vaccination and inoculation against influenza -  Flu Vaccine QUAD High Dose(Fluad)  4. Essential hypertension Controlled. Continue current regime.   5. Parkinson's disease (HCC) Managed by neuro  6. Chronic venous insufficiency Discussed importance of daily use of compression stockings  Other orders - olmesartan (BENICAR) 5 MG tablet; Take 2 tablets (10 mg total) by mouth daily.  Return in about 3 months (around 09/17/2020) for TOC.    09/19/2020, MD Primary Care at [facility] [address] [place], [place] Ph.  [phone] Fax [phone]

## 2020-06-19 LAB — TSH: TSH: 3.17 u[IU]/mL (ref 0.450–4.500)

## 2020-07-06 DIAGNOSIS — H2513 Age-related nuclear cataract, bilateral: Secondary | ICD-10-CM | POA: Diagnosis not present

## 2020-07-06 DIAGNOSIS — H5203 Hypermetropia, bilateral: Secondary | ICD-10-CM | POA: Diagnosis not present

## 2020-07-06 DIAGNOSIS — H524 Presbyopia: Secondary | ICD-10-CM | POA: Diagnosis not present

## 2020-07-06 DIAGNOSIS — H40033 Anatomical narrow angle, bilateral: Secondary | ICD-10-CM | POA: Diagnosis not present

## 2020-07-13 ENCOUNTER — Encounter: Payer: Self-pay | Admitting: Occupational Therapy

## 2020-07-13 ENCOUNTER — Ambulatory Visit: Payer: Medicare Other | Admitting: Occupational Therapy

## 2020-07-13 ENCOUNTER — Other Ambulatory Visit: Payer: Self-pay

## 2020-07-13 ENCOUNTER — Ambulatory Visit: Payer: Medicare Other | Attending: Neurology | Admitting: Physical Therapy

## 2020-07-13 DIAGNOSIS — R2689 Other abnormalities of gait and mobility: Secondary | ICD-10-CM | POA: Diagnosis not present

## 2020-07-13 DIAGNOSIS — M6281 Muscle weakness (generalized): Secondary | ICD-10-CM | POA: Diagnosis not present

## 2020-07-13 DIAGNOSIS — R2681 Unsteadiness on feet: Secondary | ICD-10-CM

## 2020-07-13 DIAGNOSIS — R293 Abnormal posture: Secondary | ICD-10-CM

## 2020-07-13 DIAGNOSIS — R29898 Other symptoms and signs involving the musculoskeletal system: Secondary | ICD-10-CM

## 2020-07-13 DIAGNOSIS — R29818 Other symptoms and signs involving the nervous system: Secondary | ICD-10-CM

## 2020-07-13 DIAGNOSIS — M25621 Stiffness of right elbow, not elsewhere classified: Secondary | ICD-10-CM | POA: Diagnosis not present

## 2020-07-13 DIAGNOSIS — R278 Other lack of coordination: Secondary | ICD-10-CM | POA: Diagnosis not present

## 2020-07-13 DIAGNOSIS — M25611 Stiffness of right shoulder, not elsewhere classified: Secondary | ICD-10-CM | POA: Insufficient documentation

## 2020-07-13 NOTE — Therapy (Signed)
[facility] [address] [place], [place], [address] Phone: [phone]   Fax:  [phone]  Physical Therapy Evaluation  Patient Details  Name: Anna Bernard MRN: [id number] Date of Birth: December 17, 1950 Referring Provider (PT): Kerin Salen, DO   Encounter Date: 07/13/2020   PT End of Session - 07/13/20 1509    Visit Number 1    Number of Visits 18    Date for PT Re-Evaluation 10/11/20   90 day cert for 9 wk POC   Authorization Type [employer] Medicare    Progress Note Due on Visit 10    PT Start Time 1405    PT Stop Time 1445    PT Time Calculation (min) 40 min    Equipment Utilized During Treatment Gait belt    Activity Tolerance Patient tolerated treatment well    Behavior During Therapy [facility] for tasks assessed/performed           Past Medical History:  Diagnosis Date  . Anemia, unspecified   . Hyperlipidemia, unspecified   . Hypertension   . Hypothyroidism, unspecified   . Pre-diabetes   . Vitamin B12 deficiency     Past Surgical History:  Procedure Laterality Date  . ABDOMINAL HYSTERECTOMY    . GIVENS CAPSULE STUDY N/A 04/16/2018   Procedure: GIVENS CAPSULE STUDY;  Surgeon: Jeani Hawking, MD;  Location: [facility] ENDOSCOPY;  Service: Endoscopy;  Laterality: N/A;  . SHOULDER SURGERY      There were no vitals filed for this visit.    Subjective Assessment - 07/13/20 1411    Subjective Reports some slowing and changes going on for several years, worse in past 6 months.  Been using rollator for a while.  Been on the (PD) medication for about 1 month; don't feel the right foot is sticking as much.  Had several falls in the past few months.  One fall misstepped and the other slid down out of a chair.    Patient is accompained by: Family member   [name], husband   Pertinent History Dx with Parkinson's disease (Dr. Arbutus Leas) 04/2020 and Sinemet started; PMH of HTN, DM, hypothyroidism, orthostatic hypotension    Patient Stated  Goals Get my walk back, be able to do things again.    Currently in Pain? No/denies              [facility] PT Assessment - 07/13/20 1415      Assessment   Medical Diagnosis Parkinson's Disease    Referring Provider (PT) Lurena Joiner Tat, DO    Onset Date/Surgical Date 04/23/20   newly diagnosed   Hand Dominance Right    Prior Therapy PT at this clinic approx 3 yrs ago after injury       Precautions   Precautions Fall      Balance Screen   Has the patient fallen in the past 6 months Yes    How many times? 3    Has the patient had a decrease in activity level because of a fear of falling?  Yes    Is the patient reluctant to leave their home because of a fear of falling?  Yes      Home Environment   Living Environment Private residence    Living Arrangements Spouse/significant other    Available Help at Discharge Family    Type of Home House    Home Access Stairs to enter    Entrance Stairs-Number of Steps 5    Entrance Stairs-Rails Can reach both  Home Layout Two level;Able to live on main level with bedroom/bathroom    Home Equipment Walker - 4 wheels;Shower seat;Cane - single point    Additional Comments Uses rollator at all times      Prior Function   Level of Independence Independent    Vocation Retired    [employer] retired approx 1.5 years ago from [employer]    Leisure enjoys traveling, enjoys reading       Observation/Other Assessments   Focus on Therapeutic Outcomes (FOTO)  NA      Posture/Postural Control   Posture/Postural Control Postural limitations    Postural Limitations Rounded Shoulders;Flexed trunk   L shoulder lower than R     ROM / Strength   AROM / PROM / Strength Strength      Strength   Overall Strength Deficits    Strength Assessment Site Hip;Knee;Ankle    Right/Left Hip Right;Left    Right Hip Flexion 3+/5    Left Hip Flexion 3+/5    Right/Left Knee Right;Left    Right Knee Flexion 3+/5    Right Knee Extension 3+/5    Left Knee  Flexion 4/5    Left Knee Extension 4/5    Right/Left Ankle Right;Left    Right Ankle Dorsiflexion 4/5    Left Ankle Dorsiflexion 4/5      Transfers   Transfers Sit to Stand;Stand to Sit    Sit to Stand 5: Supervision;4: Min guard;Without upper extremity assist;From chair/3-in-1    Five time sit to stand comments  24.15    Stand to Sit 5: Supervision;4: Min guard;Without upper extremity assist;To chair/3-in-1    Comments With gait, turning to sit at chair, pt tends to leave rollator, reach for table and not be close enough to chair.  PT provides cues for technique and safety to back up fully to chair and use UEs to control descent into sitting.      Ambulation/Gait   Ambulation/Gait Yes    Ambulation/Gait Assistance 5: Supervision;4: Min guard    Ambulation/Gait Assistance Details 2 Minute walk test:  175 ft    Ambulation Distance (Feet) 230 Feet    Assistive device Rollator    Gait Pattern Step-through pattern;Decreased step length - right;Decreased step length - left;Decreased stance time - right;Decreased stance time - left;Decreased dorsiflexion - right;Decreased dorsiflexion - left;Shuffle;Poor foot clearance - left;Poor foot clearance - right    Ambulation Surface Level;Indoor    Gait velocity 25.84 sec = 1.27 ft/sec    Gait Comments Pt notes that her R foot sticks, but not as much as it used to.      Standardized Balance Assessment   Standardized Balance Assessment Timed Up and Go Test      Timed Up and Go Test   TUG Normal TUG    Normal TUG (seconds) 28.25    Cognitive TUG (seconds) 34.65    TUG Comments Scores >13.5 sec indicate increased fall risk; >30 sec indicate difficulty with ADLs in the home.  >10% difference indicates difficulty with dual tasking.                      Objective measurements completed on examination: See above findings.               PT Education - 07/13/20 1508    Education Details Eval results, POC    Person(s) Educated  Patient;Spouse    Methods Explanation    Comprehension Verbalized understanding  PT Short Term Goals - 07/13/20 2025      PT SHORT TERM GOAL #1   Title Pt will perform HEP with family supervision, for improved balance, stregnth, transfers, and gait.  TARGET 08/14/2020    Time 5    Period Weeks    Status New      PT SHORT TERM GOAL #2   Title Pt will improve 5x sit<>stand to less than or equal to 20 seconds for improved transfer efficiency and functional strength.    Baseline 5x:  24.15    Time 5    Period Weeks    Status New      PT SHORT TERM GOAL #3   Title Berg score to be assessed, with Berg score improved by at least 5 points for decreased fall risk.    Time 5    Period Weeks    Status New      PT SHORT TERM GOAL #4   Title Pt will improve TUG score to less than or equal to 20 seconds for decreased fall risk.    Baseline 28.25 sec    Time 5    Period Weeks    Status New      PT SHORT TERM GOAL #5   Title Pt/husband will verbalize understanding of fall prevention in home environment.    Time 5    Period Weeks    Status New             PT Long Term Goals - 07/13/20 2030      PT LONG TERM GOAL #1   Title Pt will perform progression of HEP, including verbalizing plans for optimal fitness plan upon d/c from PT.  TARGET 09/11/2020    Time 9    Period Weeks    Status New      PT LONG TERM GOAL #2   Title Pt will improve 5x sit<>stand in less than or equal to 15 seconds for improved transfer efficiency and functional strength.    Time 9    Period Weeks    Status New      PT LONG TERM GOAL #3   Title Pt will improve gait velocity to at least 1.8 ft/sec for improved gait efficiency and safety.    Baseline 1.27 ft/sec    Time 9    Period Weeks    Status New      PT LONG TERM GOAL #4   Title Pt will improve TUG score to less than or equal to 15 seconds for decreased fall risk.    Time 9    Period Weeks    Status New      PT LONG TERM GOAL  #5   Title Pt will ambulate at least 215 ft in 2 minute walk test, for improved gait efficiency and safety.    Baseline 175 ft    Time 9    Period Weeks    Status New      Additional Long Term Goals   Additional Long Term Goals Yes      PT LONG TERM GOAL #6   Title Pt will verbalize understanding of local Parkinson's disease resources.    Time 9    Period Weeks    Status New                  Plan - 07/13/20 2010    Clinical Impression Statement Pt is a 69 year old female with newly diagnosed Parkinson's disease, starting after August  visit with Dr. Arbutus Leas.  Pt presents with bradykinesia, decreased functional strength, decreased balance, decreased independence with gait, abnormal posture.  Pt has hx of falls, and is at fall risk per TUG, gait velocity scores.  Pt will benefit from skilled PT to address the above stated deficits for improved overall functional mobility, decreased fall risk.    Personal Factors and Comorbidities Comorbidity 3+    Comorbidities PMH:  HTN, DM, hypothyroidism, orthostatic hypotension    Examination-Activity Limitations Locomotion Level;Stand;Transfers    Examination-Participation Restrictions Community Activity;Other   Plans for travel in retirement   Stability/Clinical Decision Making Evolving/Moderate complexity    Clinical Decision Making Moderate    Rehab Potential Good    PT Frequency 2x / week    PT Duration Other (comment)   9 weeks   PT Treatment/Interventions ADLs/Self Care Home Management;DME Instruction;Neuromuscular re-education;Balance training;Therapeutic exercise;Therapeutic activities;Functional mobility training;Gait training;Patient/family education;Manual techniques    PT Next Visit Plan Initiate HEP-sit<>stand, seated PWR! Moves, Standing PWR! Moves as able; need to look at stairs, Hospital doctor    Consulted and Agree with Plan of Care Patient;Family member/caregiver    Family Member Consulted husband, [name]            Patient will benefit from skilled therapeutic intervention in order to improve the following deficits and impairments:  Abnormal gait, Difficulty walking, Decreased balance, Decreased mobility, Decreased strength, Postural dysfunction  Visit Diagnosis: Other abnormalities of gait and mobility  Unsteadiness on feet  Muscle weakness (generalized)  Abnormal posture  Other symptoms and signs involving the nervous system     Problem List Patient Active Problem List   Diagnosis Date Noted  . Pain due to onychomycosis of toenails of both feet 06/17/2020  . Parkinson's disease (HCC) 04/23/2020  . Leg swelling 11/26/2019  . Chronic venous insufficiency 11/26/2019  . Vitamin B12 deficiency 02/06/2018  . Sepsis secondary to UTI (HCC) 01/29/2018  . Microcytic anemia 01/29/2018  . Renal insufficiency 01/29/2018  . Pure hypercholesterolemia 12/24/2014  . Diabetes mellitus type 2, controlled, without complications (HCC) 12/24/2014  . Bruit of right carotid artery 12/24/2014  . Thyroid disease 07/06/2014  . Severe obesity (BMI >= 40) (HCC) 07/06/2014  . HTN (hypertension) 07/06/2014    Carleen Rhue W. 07/13/2020, 8:39 PM  Gean Maidens., PT   [facility] [address] [place], [place], [address] Phone: [phone]   Fax:  [phone]  Name: Anna Bernard MRN: [id number] Date of Birth: [birth year]

## 2020-07-13 NOTE — Therapy (Addendum)
[facility] [address] [place], [place], [address] Phone: [phone]   Fax:  [phone]  Occupational Therapy Evaluation  Patient Details  Name: Anna Bernard MRN: [id number] Date of Birth: 12/19/1950 Referring Provider (OT): Dr. Lurena Joiner Tat   Encounter Date: 07/13/2020   OT End of Session - 07/13/20 1553    Visit Number 1    Number of Visits 17    Date for OT Re-Evaluation 09/11/20    Authorization Type Medicare UHC:  no visit limit or auth required    Authorization Time Period cert. date 07/13/20-10/11/20    Authorization - Visit Number 1    Authorization - Number of Visits 10    Progress Note Due on Visit 10    OT Start Time 1315    OT Stop Time 1403    OT Time Calculation (min) 48 min    Activity Tolerance Patient tolerated treatment well    Behavior During Therapy WFL for tasks assessed/performed           Past Medical History:  Diagnosis Date  . Anemia, unspecified   . Hyperlipidemia, unspecified   . Hypertension   . Hypothyroidism, unspecified   . Pre-diabetes   . Vitamin B12 deficiency     Past Surgical History:  Procedure Laterality Date  . ABDOMINAL HYSTERECTOMY    . GIVENS CAPSULE STUDY N/A 04/16/2018   Procedure: GIVENS CAPSULE STUDY;  Surgeon: Jeani Hawking, MD;  Location: [facility] ENDOSCOPY;  Service: Endoscopy;  Laterality: N/A;  . SHOULDER SURGERY      There were no vitals filed for this visit.   Subjective Assessment - 07/13/20 1323    Subjective  stiffness everyday.  "This is not how I thought my retirement would go."  Pt reports desire to travel.  "I just started reading again.  I had been depressed."    Pertinent History newly diagnosed PD (in last month).   PMH:  anemia, hyperlipidemia, HTN, hypothyroidism, Vitamin B12 deficiency, hx of R RTC surgery, leg swelling, arthritis (per pt), obesity, orthostatic hypotension    Limitations fall risk    Patient Stated Goals be able to move  better and travel (to [place] first)    Currently in Pain? No/denies             [facility] OT Assessment - 07/13/20 0001      Assessment   Medical Diagnosis Parkinson's Disease    Referring Provider (OT) Dr. Lurena Joiner Tat    Onset Date/Surgical Date 04/23/20   newly disagnosed   Hand Dominance Right    Prior Therapy PT at this clinic approx 3 yrs ago after injury       Precautions   Precautions Fall      Balance Screen   Has the patient fallen in the past 6 months Yes    How many times? 1 fell out of chair when reaching      Home  Environment   Family/patient expects to be discharged to: Private residence    Lives With Spouse   dtr provides some assist     Prior Function   Level of Independence Independent    Vocation Requirements retired approx 1.5 years ago from [employer]    Leisure enjoys traveling, enjoys reading       ADL   Eating/Feeding Modified independent   min difficulty opening bottles    Grooming Modified independent    Upper Body Bathing Minimal assistance   for back, uses long handled sponge  Lower Body Bathing Modified independent    Upper Body Dressing Increased time;Minimal assistance   assist for bra and jacket due to difficulty/incr time   Lower Body Dressing --   incr time for socks, pants (husband assists), no fasteners   Toilet Transfer Modified independent    Toileting - Clothing Manipulation --   min difficulty, no fasteners   Toileting -  Hygiene Modified Independent   higher height   Tub/Shower Transfer Supervision/safety   grab bars    Biomedical engineer seat with back    Transfers/Ambulation Related to ADL's pt denies difficulty with bed mobility (has bed rail)       IADL   Prior Level of Function Shopping was using motorized cart, doing mostly pick up now    Prior Level of Function Light Housekeeping performs simple tasks, but family performing most cleaning tasks    Prior Level of Function Meal Prep mod I (cooks with husband for past 6  months)    Training and development officer own vehicle   only occasionally, difficulty getting in/out of car   Medication Management Is responsible for taking medication in correct dosages at correct time    Financial Management --   performs with husband     Mobility   Mobility Status Independent;History of falls;Freezing   reports RLE "sticks"   Mobility Status Comments ambulates with rollator at all times      Written Expression   Dominant Hand Right    Handwriting --   difficulty per pt report     Vision - History   Baseline Vision Wears glasses all the time   needs new glasses per pt report     Cognition   Overall Cognitive Status Within Functional Limits for tasks assessed   no difficulty per pt/husband report     Observation/Other Assessments   Other Surveys  Select    Physical Performance Test   --      Posture/Postural Control   Posture/Postural Control Postural limitations    Postural Limitations Rounded Shoulders;Flexed trunk   L shoulder lower than R     Coordination   9 Hole Peg Test Right;Left    Right 9 Hole Peg Test 56.84    Left 9 Hole Peg Test 38.22    Box and Blocks R-31blocks, L-40blocks    Tremors husband reports mild tremor R hand initially, none with medication      Tone   Assessment Location Right Upper Extremity;Left Upper Extremity      ROM / Strength   AROM / PROM / Strength AROM      AROM   Overall AROM  Deficits    Overall AROM Comments LUE grossly WNL (140* shoulder flex).  RUE approx 100* shoulder flex with -45* elbow ext, decr supination      RUE Tone   RUE Tone Moderate      LUE Tone   LUE Tone Within Functional Limits                           OT Education - 07/13/20 1550    Education Details OT Eval results/POC;  Pt instructed to open hand big prior to grasp of objects particularly with R hand, recommended against squeezing exercise ball and instead focus on opening hand (and discussed rationale--due to rigidity and  decr PWR! due to bradykinesia/rigidity vs. decline in strength).    Person(s) Educated Patient    Methods Explanation  Comprehension Verbalized understanding            OT Short Term Goals - 07/13/20 2152      OT SHORT TERM GOAL #1   Title Pt will be independent with PD-specific HEP--check STGs 09/11/20    Time 4    Period Weeks    Status New      OT SHORT TERM GOAL #2   Title Pt will demo at least 110* R shoulder flex with -40* elbow ext for functional reach.    Baseline 100* with -45* elbow ext    Time 4    Period Weeks    Status New      OT SHORT TERM GOAL #3   Title Pt will improve R hand coordination for ADLs as shown by improving time on 9-hole peg test by at least 6sec.    Baseline 56.84sec    Time 4    Period Weeks    Status New      OT SHORT TERM GOAL #4   Title Assess PPT#4 and establish goal.    Time 4    Period Weeks    Status New      OT SHORT TERM GOAL #5   Title Pt will verbalize understanding of appropriate community resources and ways to decr risk of future complications.    Time 4    Period Weeks    Status New             OT Long Term Goals - 07/13/20 2158      OT LONG TERM GOAL #1   Title Pt will verbalize understanding of adaptive strategies to incr ease, safety, and independence with ADLs and IADLs.--check LTGs 10/11/20    Time 8    Period Weeks    Status New      OT LONG TERM GOAL #2   Title Pt will demo at least 115* R shoulder flex with -35* elbow ext for functional reach.    Baseline 100* with -45* elbow ext    Time 8    Period Weeks    Status New      OT LONG TERM GOAL #3   Title Pt will perform UB dressing consistently mod I.    Time 8    Period Weeks    Status New      OT LONG TERM GOAL #4   Title Pt will perform LB dressing consistently mod I.    Time 8    Period Weeks    Status New      OT LONG TERM GOAL #5   Title Pt willPt will improve R hand coordination for ADLs as shown by improving time on 9-hole peg test  by at least 10sec.    Baseline 56.84sec    Time 8    Period Weeks    Status New      OT LONG TERM GOAL #6   Title Pt will improved RUE functional reaching and coordination for ADLs as shown by improving score on box and blocks test by at least 5 blocks.    Baseline R-31 blocks, L-40 blocks    Time 8    Period Weeks    Status New                 Plan - 07/13/20 1554    Clinical Impression Statement Pt is a 69 y.o. female with recent Parkinson's disease diagnosis.  Pt with PMH that includes:anemia, hyperlipidemia, orthostatic hypotension, HTN, hypothyroidism, Vitamin B12  deficiency, hx of R RTC surgery, leg swelling, arthritis (per pt), obesity.  Pt/husband reports gradual incr difficulty/time needed for ADLs/IADLs over last 2 years.   Pt now receives some assist from family for ADLs/IADLs over last approx 6 months due to difficulty and time required to perform these tasks independently.  Pt also with hx of fall/fear of falling.  Pt desires to travel.  Pt presents today with bradykinesia, rigidity, decr coordination, decr posture, decr balance and functional mobility for ADLs, decr ROM, and decr dominant RUE functional use.    Pt would benefit from occupational therapy to address these deficits for incr safety/independence and ease with ADLs/IADLs, to decr risk of future complications related to PD, incr dominant RUE functional use, and improve quality of life.    OT Occupational Profile and History Detailed Assessment- Review of Records and additional review of physical, cognitive, psychosocial history related to current functional performance    Occupational performance deficits (Please refer to evaluation for details): ADL's;IADL's;Social Participation;Leisure    Body Structure / Function / Physical Skills ADL;Balance;Coordination;Decreased knowledge of use of DME;GMC;UE functional use;FMC;Tone;Mobility;Flexibility;IADL;ROM;Dexterity;Body mechanics    Rehab Potential Good    Clinical  Decision Making Several treatment options, min-mod task modification necessary    Comorbidities Affecting Occupational Performance: May have comorbidities impacting occupational performance    Modification or Assistance to Complete Evaluation  Min-Moderate modification of tasks or assist with assess necessary to complete eval    OT Frequency 2x / week    OT Duration 8 weeks   +eval or 17 visits   OT Treatment/Interventions Self-care/ADL training;Moist Heat;Aquatic Therapy;Therapeutic activities;Balance training;DME and/or AE instruction;Therapeutic exercise;Passive range of motion;Functional Mobility Training;Neuromuscular education;Cryotherapy;Manual Therapy;Energy conservation;Patient/family education;Ultrasound    Plan initiate HEP (PWR! sitting vs supine, PWR! hands), assess PPT#4 and set goal, assess standing functional reach, sitting functional reach    Consulted and Agree with Plan of Care Patient;Family member/caregiver    Family Member Consulted husband           Patient will benefit from skilled therapeutic intervention in order to improve the following deficits and impairments:   Body Structure / Function / Physical Skills: ADL, Balance, Coordination, Decreased knowledge of use of DME, GMC, UE functional use, FMC, Tone, Mobility, Flexibility, IADL, ROM, Dexterity, Body mechanics       Visit Diagnosis: Other symptoms and signs involving the nervous system - Plan: Ot plan of care cert/re-cert  Other symptoms and signs involving the musculoskeletal system - Plan: Ot plan of care cert/re-cert  Other lack of coordination - Plan: Ot plan of care cert/re-cert  Abnormal posture - Plan: Ot plan of care cert/re-cert  Unsteadiness on feet - Plan: Ot plan of care cert/re-cert  Other abnormalities of gait and mobility - Plan: Ot plan of care cert/re-cert  Stiffness of right shoulder, not elsewhere classified - Plan: Ot plan of care cert/re-cert  Stiffness of right elbow, not  elsewhere classified - Plan: Ot plan of care cert/re-cert    Problem List Patient Active Problem List   Diagnosis Date Noted  . Pain due to onychomycosis of toenails of both feet 06/17/2020  . Parkinson's disease (HCC) 04/23/2020  . Leg swelling 11/26/2019  . Chronic venous insufficiency 11/26/2019  . Vitamin B12 deficiency 02/06/2018  . Sepsis secondary to UTI (HCC) 01/29/2018  . Microcytic anemia 01/29/2018  . Renal insufficiency 01/29/2018  . Pure hypercholesterolemia 12/24/2014  . Diabetes mellitus type 2, controlled, without complications (HCC) 12/24/2014  . Bruit of right carotid artery 12/24/2014  . Thyroid  disease 07/06/2014  . Severe obesity (BMI >= 40) (HCC) 07/06/2014  . HTN (hypertension) 07/06/2014    [facility] 07/13/2020, 10:17 PM  [facility] [address] [place], [place], [address] Phone: [phone]   Fax:  [phone]  Name: Anna Bernard MRN: [id number] Date of Birth: August 18, 1951   Willa Frater, OTR/L [facility] [address] [place], [place]  [address] [phone] phone [phone] 07/13/20 10:17 PM

## 2020-07-13 NOTE — Addendum Note (Signed)
Addended by: Willa Frater D on: 07/13/2020 10:17 PM   Modules accepted: Orders

## 2020-07-14 ENCOUNTER — Ambulatory Visit: Payer: Medicare Other | Admitting: Physical Therapy

## 2020-07-14 DIAGNOSIS — R29818 Other symptoms and signs involving the nervous system: Secondary | ICD-10-CM | POA: Diagnosis not present

## 2020-07-14 DIAGNOSIS — R2681 Unsteadiness on feet: Secondary | ICD-10-CM | POA: Diagnosis not present

## 2020-07-14 DIAGNOSIS — R293 Abnormal posture: Secondary | ICD-10-CM

## 2020-07-14 DIAGNOSIS — M6281 Muscle weakness (generalized): Secondary | ICD-10-CM | POA: Diagnosis not present

## 2020-07-14 DIAGNOSIS — M25611 Stiffness of right shoulder, not elsewhere classified: Secondary | ICD-10-CM | POA: Diagnosis not present

## 2020-07-14 DIAGNOSIS — M25621 Stiffness of right elbow, not elsewhere classified: Secondary | ICD-10-CM | POA: Diagnosis not present

## 2020-07-14 DIAGNOSIS — R2689 Other abnormalities of gait and mobility: Secondary | ICD-10-CM

## 2020-07-14 DIAGNOSIS — R278 Other lack of coordination: Secondary | ICD-10-CM | POA: Diagnosis not present

## 2020-07-14 DIAGNOSIS — R29898 Other symptoms and signs involving the musculoskeletal system: Secondary | ICD-10-CM | POA: Diagnosis not present

## 2020-07-15 NOTE — Therapy (Signed)
[facility] [address] [place], [place], [address] Phone: [phone]   Fax:  [phone]  Physical Therapy Treatment  Patient Details  Name: Anna Bernard MRN: [id number] Date of Birth: [birth year] Referring Provider (PT): Kerin Salen, DO   Encounter Date: 07/14/2020   PT End of Session - 07/15/20 0818    Visit Number 2    Number of Visits 18    Date for PT Re-Evaluation 10/11/20   90 day cert for 9 wk POC   Authorization Type UHC Medicare    Progress Note Due on Visit 10    PT Start Time 1021    PT Stop Time 1101    PT Time Calculation (min) 40 min    Equipment Utilized During Treatment Gait belt    Activity Tolerance Patient tolerated treatment well    Behavior During Therapy [facility] for tasks assessed/performed           Past Medical History:  Diagnosis Date  . Anemia, unspecified   . Hyperlipidemia, unspecified   . Hypertension   . Hypothyroidism, unspecified   . Pre-diabetes   . Vitamin B12 deficiency     Past Surgical History:  Procedure Laterality Date  . ABDOMINAL HYSTERECTOMY    . GIVENS CAPSULE STUDY N/A 04/16/2018   Procedure: GIVENS CAPSULE STUDY;  Surgeon: Jeani Hawking, MD;  Location: [facility] ENDOSCOPY;  Service: Endoscopy;  Laterality: N/A;  . SHOULDER SURGERY      There were no vitals filed for this visit.   Subjective Assessment - 07/14/20 1030    Subjective I feel better about things.  Glad I am here for therapy.    Patient is accompained by: --    Pertinent History Dx with Parkinson's disease (Dr. Arbutus Leas) 04/2020 and Sinemet started; PMH of HTN, DM, hypothyroidism, orthostatic hypotension    Patient Stated Goals Get my walk back, be able to do things again.    Currently in Pain? No/denies                             [facility] Adult PT Treatment/Exercise - 07/14/20 1025      Transfers   Transfers Sit to Stand;Stand to Sit    Sit to Stand 5: Supervision;4: Min guard;From  chair/3-in-1;With upper extremity assist;From bed    Sit to Stand Details Verbal cues for sequencing;Verbal cues for technique    Sit to Stand Details (indicate cue type and reason) Cues for hand placement    Stand to Sit 5: Supervision;4: Min guard;To chair/3-in-1;With upper extremity assist;To bed    Stand to Sit Details (indicate cue type and reason) Verbal cues for sequencing;Verbal cues for technique    Stand to Sit Details Cues to fully turn to sit, cues for hand placement, to reach to chair      Ambulation/Gait   Ambulation/Gait Yes    Ambulation/Gait Assistance 5: Supervision;4: Min guard    Ambulation/Gait Assistance Details Cues for upright posture, slowed pace of stepping, for increased step length    Ambulation Distance (Feet) 100 Feet   115, then additional 100 ft   Assistive device Rollator    Gait Pattern Step-through pattern;Decreased step length - right;Decreased step length - left;Decreased stance time - right;Decreased stance time - left;Decreased dorsiflexion - right;Decreased dorsiflexion - left;Shuffle;Poor foot clearance - left;Poor foot clearance - right    Ambulation Surface Level;Indoor    Stairs Yes    Stairs Assistance 4: Min guard;4:  Min assist    Stairs Assistance Details (indicate cue type and reason) Pt fearful with descending stairs; reports she goes down backwards at home.  PT provides cues and assistance for sequence, foot clearance, foot placement.    Stair Management Technique Two rails;Step to pattern;Forwards    Number of Stairs 4   x 2   Height of Stairs 6    Gait Comments PT lowered pt's walker and pt would like to try it like this at home.  Cues for slowed pace, increased step length/foot clearance.      Standardized Balance Assessment   Standardized Balance Assessment Berg Balance Test      Berg Balance Test   Sit to Stand Able to stand  independently using hands    Standing Unsupported Able to stand 2 minutes with supervision    Sitting with  Back Unsupported but Feet Supported on Floor or Stool Able to sit safely and securely 2 minutes    Stand to Sit Controls descent by using hands    Transfers Able to transfer with verbal cueing and /or supervision    Standing Unsupported with Eyes Closed Able to stand 10 seconds with supervision    Standing Ubsupported with Feet Together Needs help to attain position but able to stand for 30 seconds with feet together    From Standing, Reach Forward with Outstretched Arm Can reach forward >5 cm safely (2")    From Standing Position, Pick up Object from Floor Unable to try/needs assist to keep balance    From Standing Position, Turn to Look Behind Over each Shoulder Needs supervision when turning    Turn 360 Degrees Needs assistance while turning    Standing Unsupported, Alternately Place Feet on Step/Stool Needs assistance to keep from falling or unable to try    Standing Unsupported, One Foot in Front Needs help to step but can hold 15 seconds    Standing on One Leg Unable to try or needs assist to prevent fall    Total Score 23      Exercises   Exercises Other Exercises    Other Exercises  Seated marching x 10 reps, seated ankle pumps x 10 reps (pt c/o pain in R ankle)           Pt performs PWR! Moves in seated position, at edge of mat:  PWR! Up for improved posture x 10 reps  PWR! Rock for improved weighshifting x 10 reps, starting through hips, then with added UE reach  PWR! Step for improved step initiation x 10 reps, single step out and in  Visual, verbal cues provided for technique          PT Education - 07/15/20 0817    Education Details Fall risk associated with Berg score; need to continue using rollator as means of stability and safety with gait.    Person(s) Educated Patient    Methods Explanation    Comprehension Verbalized understanding            PT Short Term Goals - 07/13/20 2025      PT SHORT TERM GOAL #1   Title Pt will perform HEP with family  supervision, for improved balance, stregnth, transfers, and gait.  TARGET 08/14/2020    Time 5    Period Weeks    Status New      PT SHORT TERM GOAL #2   Title Pt will improve 5x sit<>stand to less than or equal to 20 seconds for improved transfer efficiency  and functional strength.    Baseline 5x:  24.15    Time 5    Period Weeks    Status New      PT SHORT TERM GOAL #3   Title Berg score to be assessed, with Berg score improved by at least 5 points for decreased fall risk.    Time 5    Period Weeks    Status New      PT SHORT TERM GOAL #4   Title Pt will improve TUG score to less than or equal to 20 seconds for decreased fall risk.    Baseline 28.25 sec    Time 5    Period Weeks    Status New      PT SHORT TERM GOAL #5   Title Pt/husband will verbalize understanding of fall prevention in home environment.    Time 5    Period Weeks    Status New             PT Long Term Goals - 07/13/20 2030      PT LONG TERM GOAL #1   Title Pt will perform progression of HEP, including verbalizing plans for optimal fitness plan upon d/c from PT.  TARGET 09/11/2020    Time 9    Period Weeks    Status New      PT LONG TERM GOAL #2   Title Pt will improve 5x sit<>stand in less than or equal to 15 seconds for improved transfer efficiency and functional strength.    Time 9    Period Weeks    Status New      PT LONG TERM GOAL #3   Title Pt will improve gait velocity to at least 1.8 ft/sec for improved gait efficiency and safety.    Baseline 1.27 ft/sec    Time 9    Period Weeks    Status New      PT LONG TERM GOAL #4   Title Pt will improve TUG score to less than or equal to 15 seconds for decreased fall risk.    Time 9    Period Weeks    Status New      PT LONG TERM GOAL #5   Title Pt will ambulate at least 215 ft in 2 minute walk test, for improved gait efficiency and safety.    Baseline 175 ft    Time 9    Period Weeks    Status New      Additional Long Term Goals    Additional Long Term Goals Yes      PT LONG TERM GOAL #6   Title Pt will verbalize understanding of local Parkinson's disease resources.    Time 9    Period Weeks    Status New                 Plan - 07/15/20 0819    Clinical Impression Statement Anna MottsBerg Balance assessment completed today, with pt's score 23/56, indicating significant risk of falls.  Educated pt to continue to use rollator for safety with gait due to pt's high fall risk.  Remainder of session focused on trial of seated PWR! Moves exercises and cues for increased step length and foot clearance with gait.  Pt will benefit from skilled PT to further address balance, strength, posture and gait for improved overall funcitonal mobility and decreased fall risk.    Personal Factors and Comorbidities Comorbidity 3+    Comorbidities PMH:  HTN, DM, hypothyroidism, orthostatic  hypotension    Examination-Activity Limitations Locomotion Level;Stand;Transfers    Examination-Participation Restrictions Community Activity;Other   Plans for travel in retirement   Stability/Clinical Decision Making Evolving/Moderate complexity    Rehab Potential Good    PT Frequency 2x / week    PT Duration Other (comment)   9 weeks   PT Treatment/Interventions ADLs/Self Care Home Management;DME Instruction;Neuromuscular re-education;Balance training;Therapeutic exercise;Therapeutic activities;Functional mobility training;Gait training;Patient/family education;Manual techniques    PT Next Visit Plan Initiate HEP-sit<>stand, seated PWR! Moves, Standing PWR! Moves as able; exercises to work on single limb stance, foot clearance/step length    Consulted and Agree with Plan of Care Patient    Family Member Consulted --           Patient will benefit from skilled therapeutic intervention in order to improve the following deficits and impairments:  Abnormal gait, Difficulty walking, Decreased balance, Decreased mobility, Decreased strength, Postural  dysfunction  Visit Diagnosis: Other abnormalities of gait and mobility  Unsteadiness on feet  Abnormal posture     Problem List Patient Active Problem List   Diagnosis Date Noted  . Pain due to onychomycosis of toenails of both feet 06/17/2020  . Parkinson's disease (HCC) 04/23/2020  . Leg swelling 11/26/2019  . Chronic venous insufficiency 11/26/2019  . Vitamin B12 deficiency 02/06/2018  . Sepsis secondary to UTI (HCC) 01/29/2018  . Microcytic anemia 01/29/2018  . Renal insufficiency 01/29/2018  . Pure hypercholesterolemia 12/24/2014  . Diabetes mellitus type 2, controlled, without complications (HCC) 12/24/2014  . Bruit of right carotid artery 12/24/2014  . Thyroid disease 07/06/2014  . Severe obesity (BMI >= 40) (HCC) 07/06/2014  . HTN (hypertension) 07/06/2014    Anna Bernard W. 07/15/2020, 8:26 AM Gean Maidens., PT  [name] [facility] [address] [place], [place], [address] Phone: [phone]   Fax:  [phone]  Name: Anna Bernard MRN: [id number] Date of Birth: November 04, 1950

## 2020-07-20 ENCOUNTER — Other Ambulatory Visit: Payer: Self-pay

## 2020-07-20 ENCOUNTER — Ambulatory Visit: Payer: Medicare Other | Admitting: Physical Therapy

## 2020-07-20 ENCOUNTER — Ambulatory Visit: Payer: Medicare Other | Attending: Neurology | Admitting: Physical Therapy

## 2020-07-20 ENCOUNTER — Encounter: Payer: Self-pay | Admitting: Physical Therapy

## 2020-07-20 DIAGNOSIS — M25611 Stiffness of right shoulder, not elsewhere classified: Secondary | ICD-10-CM | POA: Diagnosis not present

## 2020-07-20 DIAGNOSIS — M6281 Muscle weakness (generalized): Secondary | ICD-10-CM | POA: Insufficient documentation

## 2020-07-20 DIAGNOSIS — R29898 Other symptoms and signs involving the musculoskeletal system: Secondary | ICD-10-CM | POA: Insufficient documentation

## 2020-07-20 DIAGNOSIS — R29818 Other symptoms and signs involving the nervous system: Secondary | ICD-10-CM | POA: Diagnosis not present

## 2020-07-20 DIAGNOSIS — R2681 Unsteadiness on feet: Secondary | ICD-10-CM | POA: Diagnosis not present

## 2020-07-20 DIAGNOSIS — M25621 Stiffness of right elbow, not elsewhere classified: Secondary | ICD-10-CM | POA: Diagnosis not present

## 2020-07-20 DIAGNOSIS — R2689 Other abnormalities of gait and mobility: Secondary | ICD-10-CM | POA: Diagnosis not present

## 2020-07-20 DIAGNOSIS — R293 Abnormal posture: Secondary | ICD-10-CM | POA: Diagnosis not present

## 2020-07-20 DIAGNOSIS — R278 Other lack of coordination: Secondary | ICD-10-CM | POA: Insufficient documentation

## 2020-07-20 NOTE — Patient Instructions (Signed)
Sit to Stand: Phase 3    Sitting. Lean trunk forward. Push up on arms or with hands on your knees and stand up. Come up to full standing with knees straight.  Return to sitting controlling your movement.   Repeat _10__ times. Do _3__ times a day.  Copyright  VHI. All rights reserved.  Marching    Alternate lifting knees as high as is comfortable, as if marching. Repeat _10__ times each leg. Do 2-3 sessions per day. Note: If possible place feet on floor.  Copyright  VHI. All rights reserved.

## 2020-07-20 NOTE — Therapy (Signed)
[facility] [address] [place], [place], [address] Phone: [phone]   Fax:  [phone]  Physical Therapy Treatment  Patient Details  Name: Anna Bernard MRN: [id number] Date of Birth: November 02, 1950 Referring Provider (PT): Kerin Salen, DO   Encounter Date: 07/20/2020   PT End of Session - 07/20/20 1253    Visit Number 3    Number of Visits 18    Date for PT Re-Evaluation 10/11/20   90 day cert for 9 wk POC   Authorization Type [employer] Medicare    Progress Note Due on Visit 10    PT Start Time 1100    PT Stop Time 1145    PT Time Calculation (min) 45 min    Equipment Utilized During Treatment Gait belt    Activity Tolerance Patient tolerated treatment well    Behavior During Therapy [facility] for tasks assessed/performed           Past Medical History:  Diagnosis Date  . Anemia, unspecified   . Hyperlipidemia, unspecified   . Hypertension   . Hypothyroidism, unspecified   . Pre-diabetes   . Vitamin B12 deficiency     Past Surgical History:  Procedure Laterality Date  . ABDOMINAL HYSTERECTOMY    . GIVENS CAPSULE STUDY N/A 04/16/2018   Procedure: GIVENS CAPSULE STUDY;  Surgeon: Jeani Hawking, MD;  Location: [facility] ENDOSCOPY;  Service: Endoscopy;  Laterality: N/A;  . SHOULDER SURGERY      There were no vitals filed for this visit.   Subjective Assessment - 07/20/20 1108    Subjective "She lowered my walker last time and I liked it better the other way."    Pertinent History Dx with Parkinson's disease (Dr. Arbutus Leas) 04/2020 and Sinemet started; PMH of HTN, DM, hypothyroidism, orthostatic hypotension    Patient Stated Goals Get my walk back, be able to do things again.    Currently in Pain? No/denies                             [facility] Adult PT Treatment/Exercise - 07/20/20 0001      Transfers   Transfers Sit to Stand;Stand to Sit    Sit to Stand 5: Supervision;4: Min guard;From chair/3-in-1;With upper  extremity assist;From bed    Sit to Stand Details Verbal cues for sequencing;Verbal cues for technique    Sit to Stand Details (indicate cue type and reason) cues for hand placement and forward lean    Stand to Sit 5: Supervision;4: Min guard;To chair/3-in-1;With upper extremity assist;To bed    Stand to Sit Details (indicate cue type and reason) Verbal cues for sequencing;Verbal cues for technique    Stand to Sit Details cues to control descent    Number of Reps 10 reps      Ambulation/Gait   Ambulation/Gait Yes    Ambulation/Gait Assistance 5: Supervision    Ambulation/Gait Assistance Details cues for slowing pace, upright posture, R foot clearance    Ambulation Distance (Feet) 110 Feet   x 2 and 100 x 2   Assistive device Rollator    Gait Pattern Step-through pattern;Decreased step length - right;Decreased step length - left;Decreased stance time - right;Decreased stance time - left;Decreased dorsiflexion - right;Decreased dorsiflexion - left;Shuffle;Poor foot clearance - left;Poor foot clearance - right    Ambulation Surface Level;Indoor    Gait Comments Pt asking for PTA to raise rollator back up from where PT lowered it previous session.  States she does not feel safe with it at that level despite appearing to be more appropriate height for pt.  Pt did not tend to lean on Rollator with forearms when elevated though did discuss for pt not to do this due to safety concerns.      Posture/Postural Control   Posture/Postural Control Postural limitations    Postural Limitations Rounded Shoulders;Flexed trunk      Exercises   Exercises Other Exercises    Other Exercises  seated marching bil x 10 reps-provided as HEP           Pt performs PWR! Moves in seated position, in chair without arms:  PWR! Up for improved posture x 10 reps.  Cues for technique.  PWR! Rock for improved weighshifting x 10 reps, starting through hips, then with added UE reach.  Pt with difficulty raising arms  and states it is due to her shirt being tight but appears to have significant decreased shoulder flexion.  Cues for technique.  PWR! Step for improved step initiation x 10 reps, single step out and in.  Performed with 10 additional reps using foam in floor to encourage increased step.  Visual, verbal cues provided for technique        PT Education - 07/20/20 1253    Education Details HEP as provided today    Person(s) Educated Patient    Methods Explanation;Demonstration;Verbal cues;Handout    Comprehension Verbalized understanding;Need further instruction            PT Short Term Goals - 07/13/20 2025      PT SHORT TERM GOAL #1   Title Pt will perform HEP with family supervision, for improved balance, stregnth, transfers, and gait.  TARGET 08/14/2020    Time 5    Period Weeks    Status New      PT SHORT TERM GOAL #2   Title Pt will improve 5x sit<>stand to less than or equal to 20 seconds for improved transfer efficiency and functional strength.    Baseline 5x:  24.15    Time 5    Period Weeks    Status New      PT SHORT TERM GOAL #3   Title Berg score to be assessed, with Berg score improved by at least 5 points for decreased fall risk.    Time 5    Period Weeks    Status New      PT SHORT TERM GOAL #4   Title Pt will improve TUG score to less than or equal to 20 seconds for decreased fall risk.    Baseline 28.25 sec    Time 5    Period Weeks    Status New      PT SHORT TERM GOAL #5   Title Pt/husband will verbalize understanding of fall prevention in home environment.    Time 5    Period Weeks    Status New             PT Long Term Goals - 07/13/20 2030      PT LONG TERM GOAL #1   Title Pt will perform progression of HEP, including verbalizing plans for optimal fitness plan upon d/c from PT.  TARGET 09/11/2020    Time 9    Period Weeks    Status New      PT LONG TERM GOAL #2   Title Pt will improve 5x sit<>stand in less than or equal to 15  seconds for improved transfer efficiency  and functional strength.    Time 9    Period Weeks    Status New      PT LONG TERM GOAL #3   Title Pt will improve gait velocity to at least 1.8 ft/sec for improved gait efficiency and safety.    Baseline 1.27 ft/sec    Time 9    Period Weeks    Status New      PT LONG TERM GOAL #4   Title Pt will improve TUG score to less than or equal to 15 seconds for decreased fall risk.    Time 9    Period Weeks    Status New      PT LONG TERM GOAL #5   Title Pt will ambulate at least 215 ft in 2 minute walk test, for improved gait efficiency and safety.    Baseline 175 ft    Time 9    Period Weeks    Status New      Additional Long Term Goals   Additional Long Term Goals Yes      PT LONG TERM GOAL #6   Title Pt will verbalize understanding of local Parkinson's disease resources.    Time 9    Period Weeks    Status New                 Plan - 07/20/20 1254    Clinical Impression Statement Session focused on gait, transfers and HEP to address deficits.  Pt needs cues for intensity of movement and technique with exercises.  Continues with decreased foot clearance on R.  Cont per poc.    Personal Factors and Comorbidities Comorbidity 3+    Comorbidities PMH:  HTN, DM, hypothyroidism, orthostatic hypotension    Examination-Activity Limitations Locomotion Level;Stand;Transfers    Examination-Participation Restrictions Community Activity;Other   Plans for travel in retirement   Stability/Clinical Decision Making Evolving/Moderate complexity    Rehab Potential Good    PT Frequency 2x / week    PT Duration Other (comment)   9 weeks   PT Treatment/Interventions ADLs/Self Care Home Management;DME Instruction;Neuromuscular re-education;Balance training;Therapeutic exercise;Therapeutic activities;Functional mobility training;Gait training;Patient/family education;Manual techniques    PT Next Visit Plan Review HEP and revise as needed; exercises to  work on single limb stance, foot clearance/step length    Consulted and Agree with Plan of Care Patient           Patient will benefit from skilled therapeutic intervention in order to improve the following deficits and impairments:  Abnormal gait, Difficulty walking, Decreased balance, Decreased mobility, Decreased strength, Postural dysfunction  Visit Diagnosis: Other abnormalities of gait and mobility  Unsteadiness on feet  Abnormal posture     Problem List Patient Active Problem List   Diagnosis Date Noted  . Pain due to onychomycosis of toenails of both feet 06/17/2020  . Parkinson's disease (HCC) 04/23/2020  . Leg swelling 11/26/2019  . Chronic venous insufficiency 11/26/2019  . Vitamin B12 deficiency 02/06/2018  . Sepsis secondary to UTI (HCC) 01/29/2018  . Microcytic anemia 01/29/2018  . Renal insufficiency 01/29/2018  . Pure hypercholesterolemia 12/24/2014  . Diabetes mellitus type 2, controlled, without complications (HCC) 12/24/2014  . Bruit of right carotid artery 12/24/2014  . Thyroid disease 07/06/2014  . Severe obesity (BMI >= 40) (HCC) 07/06/2014  . HTN (hypertension) 07/06/2014    Newell Coralenise Terry Alizee Maple, PTA [facility] 07/20/20 12:58 PM Phone: [phone] Fax: [phone]   [facility] [address] [place], [place], [address] Phone: [phone]   Fax:  [phone]  Name: IZOLA TEAGUE MRN: [id number] Date of Birth: 26-Aug-1951

## 2020-07-22 ENCOUNTER — Ambulatory Visit: Payer: Medicare Other | Admitting: Physical Therapy

## 2020-07-22 ENCOUNTER — Other Ambulatory Visit: Payer: Self-pay

## 2020-07-22 ENCOUNTER — Encounter: Payer: Self-pay | Admitting: Physical Therapy

## 2020-07-22 DIAGNOSIS — M6281 Muscle weakness (generalized): Secondary | ICD-10-CM | POA: Diagnosis not present

## 2020-07-22 DIAGNOSIS — R2689 Other abnormalities of gait and mobility: Secondary | ICD-10-CM

## 2020-07-22 DIAGNOSIS — R29898 Other symptoms and signs involving the musculoskeletal system: Secondary | ICD-10-CM | POA: Diagnosis not present

## 2020-07-22 DIAGNOSIS — R293 Abnormal posture: Secondary | ICD-10-CM | POA: Diagnosis not present

## 2020-07-22 DIAGNOSIS — R278 Other lack of coordination: Secondary | ICD-10-CM | POA: Diagnosis not present

## 2020-07-22 DIAGNOSIS — M25621 Stiffness of right elbow, not elsewhere classified: Secondary | ICD-10-CM | POA: Diagnosis not present

## 2020-07-22 DIAGNOSIS — M25611 Stiffness of right shoulder, not elsewhere classified: Secondary | ICD-10-CM | POA: Diagnosis not present

## 2020-07-22 DIAGNOSIS — R2681 Unsteadiness on feet: Secondary | ICD-10-CM | POA: Diagnosis not present

## 2020-07-22 DIAGNOSIS — R29818 Other symptoms and signs involving the nervous system: Secondary | ICD-10-CM

## 2020-07-22 NOTE — Therapy (Signed)
[facility] [address] [place], [place], [address] Phone: [phone]   Fax:  [phone]  Physical Therapy Treatment  Patient Details  Name: Anna Bernard MRN: [id number] Date of Birth: 03/31/1951 Referring Provider (PT): Kerin Salenebecca Tat, DO   Encounter Date: 07/22/2020   PT End of Session - 07/22/20 1719    Visit Number 4    Number of Visits 18    Date for PT Re-Evaluation 10/11/20   90 day cert for 9 wk POC   Authorization Type U[employer] Medicare    Progress Note Due on Visit 10    PT Start Time 1230    PT Stop Time 1315    PT Time Calculation (min) 45 min    Equipment Utilized During Treatment Gait belt    Activity Tolerance Patient tolerated treatment well    Behavior [facility] for tasks assessed/performed           Past Medical History:  Diagnosis Date  . Anemia, unspecified   . Hyperlipidemia, unspecified   . Hypertension   . Hypothyroidism, unspecified   . Pre-diabetes   . Vitamin B12 deficiency     Past Surgical History:  Procedure Laterality Date  . ABDOMINAL HYSTERECTOMY    . GIVENS CAPSULE STUDY N/A 04/16/2018   Procedure: GIVENS CAPSULE STUDY;  Surgeon: Jeani HawkingHung, Patrick, MD;  [facility];  Service: Endoscopy;  Laterality: N/A;  . SHOULDER SURGERY      There were no vitals filed for this visit.   Subjective Assessment - 07/22/20 1234    Subjective Tried the exercises once at home, "they were enjoyable"    Pertinent History Dx with Parkinson's disease (Dr. Arbutus Leasat) 04/2020 and Sinemet started; PMH of HTN, DM, hypothyroidism, orthostatic hypotension    Patient Stated Goals Get my walk back, be able to do things again.    Currently in Pain? Yes    Pain Score 3    states it is arthritic pain   Pain Location Foot    Pain Orientation Right    Pain Descriptors / Indicators Sore    Pain Type Chronic pain    Aggravating Factors  doing walking on it    Pain Relieving Factors Aleve                         Pt performs PWR! Moves in seated position, in chair without arms:  PWR! Up for improved posture2 x 10 reps.  Cues for technique.  PWR! Rock for improved weighshiftingx 10 reps, starting through hips, then with added UE reach 2 x 5 reps B, cues for reaching arm up and in and performing with big hands and spreading fingers wide, pt reporting no shoulder pain when performing.   PWR! Step for improved step initiationx 10 reps, single step out and in.  Performed with 10 additional reps using 2" foam on floor to encourage increased step.   Discussed with pt importance of intensity of movement and performing at an intensity level of a 6-7/10 at home.      [facility] Adult PT Treatment/Exercise - 07/22/20 0001      Transfers   Transfers Sit to Stand;Stand to Sit    Sit to Stand 5: Supervision;4: Min guard;From chair/3-in-1;With upper extremity assist;From bed    Sit to Stand Details Verbal cues for sequencing;Verbal cues for technique    Sit to Stand Details (indicate cue type and reason) cues for proper hand placement and nose  over toes for forward lean    Stand to Sit 5: Supervision;4: Min guard;To chair/3-in-1;With upper extremity assist;To bed    Stand to Sit Details (indicate cue type and reason) Verbal cues for sequencing;Verbal cues for technique    Number of Reps --   5 reps from mat table   Transfer Cueing cues to lock rollator brakes prior to sitting down    Comments performed sit <> stand from lower height chair after performing seated PWR moves on chair without UE support ,cues to scoot towards edge of chair and having one hand on chair and one on rollator, during first attempt pt fell back into chair after standing, performed a 2nd attempt with cues for incr momentum and rocking to come up to stand with pt able to perform on 1st attempt with min A      Ambulation/Gait   Ambulation/Gait Yes    Ambulation/Gait Assistance 5: Supervision    Ambulation/Gait  Assistance Details cues for upright posture and staying close to rollator and incr step length B. pt with improved R foot clearance at end of session after activity.     Ambulation Distance (Feet) 115 Feet   x2, plus in and out of session   Assistive device Rollator    Gait Pattern Step-through pattern;Decreased step length - right;Decreased step length - left;Decreased stance time - right;Decreased stance time - left;Decreased dorsiflexion - right;Decreased dorsiflexion - left;Shuffle;Poor foot clearance - left;Poor foot clearance - right    Ambulation Surface Level;Indoor      Exercises   Exercises Other Exercises    Other Exercises  seated marching 2 x 10 repes B with visual cue on how high to march legs, then performing 2 x 10 reps standing marching with BUE support on chair                PT Education - 07/22/20 1718    Education Details reviewed pt's HEP and gave pt another handout of PWR moves with additional written instructions, pt with loose brakes on rollator - gave pt address of medical supply stores to see if they will tighten pt's rollator brakes for her (pt reports she got rollator off of [employer])    Person(s) Educated Patient    Methods Explanation;Demonstration;Verbal cues;Handout    Comprehension Verbalized understanding;Returned demonstration            PT Short Term Goals - 07/13/20 2025      PT SHORT TERM GOAL #1   Title Pt will perform HEP with family supervision, for improved balance, stregnth, transfers, and gait.  TARGET 08/14/2020    Time 5    Period Weeks    Status New      PT SHORT TERM GOAL #2   Title Pt will improve 5x sit<>stand to less than or equal to 20 seconds for improved transfer efficiency and functional strength.    Baseline 5x:  24.15    Time 5    Period Weeks    Status New      PT SHORT TERM GOAL #3   Title Berg score to be assessed, with Berg score improved by at least 5 points for decreased fall risk.    Time 5    Period Weeks     Status New      PT SHORT TERM GOAL #4   Title Pt will improve TUG score to less than or equal to 20 seconds for decreased fall risk.    Baseline 28.25 sec  Time 5    Period Weeks    Status New      PT SHORT TERM GOAL #5   Title Pt/husband will verbalize understanding of fall prevention in home environment.    Time 5    Period Weeks    Status New             PT Long Term Goals - 07/13/20 2030      PT LONG TERM GOAL #1   Title Pt will perform progression of HEP, including verbalizing plans for optimal fitness plan upon d/c from PT.  TARGET 09/11/2020    Time 9    Period Weeks    Status New      PT LONG TERM GOAL #2   Title Pt will improve 5x sit<>stand in less than or equal to 15 seconds for improved transfer efficiency and functional strength.    Time 9    Period Weeks    Status New      PT LONG TERM GOAL #3   Title Pt will improve gait velocity to at least 1.8 ft/sec for improved gait efficiency and safety.    Baseline 1.27 ft/sec    Time 9    Period Weeks    Status New      PT LONG TERM GOAL #4   Title Pt will improve TUG score to less than or equal to 15 seconds for decreased fall risk.    Time 9    Period Weeks    Status New      PT LONG TERM GOAL #5   Title Pt will ambulate at least 215 ft in 2 minute walk test, for improved gait efficiency and safety.    Baseline 175 ft    Time 9    Period Weeks    Status New      Additional Long Term Goals   Additional Long Term Goals Yes      PT LONG TERM GOAL #6   Title Pt will verbalize understanding of local Parkinson's disease resources.    Time 9    Period Weeks    Status New                 Plan - 07/22/20 1720    Clinical Impression Statement Reviewed pt's HEP given at previous session with pt needing reminder demo cues for proper technique for seated PWR moves. Pt demonstrating improved R foot clearance during gait at end of session after activities for incr intensity of movement and incr  foot clearance. Will continue to progress towards LTGs.    Personal Factors and Comorbidities Comorbidity 3+    Comorbidities PMH:  HTN, DM, hypothyroidism, orthostatic hypotension    Examination-Activity Limitations Locomotion Level;Stand;Transfers    Examination-Participation Restrictions Community Activity;Other   Plans for travel in retirement   Stability/Clinical Decision Making Evolving/Moderate complexity    Rehab Potential Good    PT Frequency 2x / week    PT Duration Other (comment)   9 weeks   PT Treatment/Interventions ADLs/Self Care Home Management;DME Instruction;Neuromuscular re-education;Balance training;Therapeutic exercise;Therapeutic activities;Functional mobility training;Gait training;Patient/family education;Manual techniques    PT Next Visit Plan perform seated PWR moves - can attempt PWR twist, gait training, exercises to work on single limb stance, foot clearance/step lengt, try SciFit    Consulted and Agree with Plan of Care Patient           Patient will benefit from skilled therapeutic intervention in order to improve the following deficits and impairments:  Abnormal gait, Difficulty walking, Decreased balance, Decreased mobility, Decreased strength, Postural dysfunction  Visit Diagnosis: Other abnormalities of gait and mobility  Unsteadiness on feet  Abnormal posture  Other symptoms and signs involving the nervous system     Problem List Patient Active Problem List   Diagnosis Date Noted  . Pain due to onychomycosis of toenails of both feet 06/17/2020  . Parkinson's disease (HCC) 04/23/2020  . Leg swelling 11/26/2019  . Chronic venous insufficiency 11/26/2019  . Vitamin B12 deficiency 02/06/2018  . Sepsis secondary to UTI (HCC) 01/29/2018  . Microcytic anemia 01/29/2018  . Renal insufficiency 01/29/2018  . Pure hypercholesterolemia 12/24/2014  . Diabetes mellitus type 2, controlled, without complications (HCC) 12/24/2014  . Bruit of right  carotid artery 12/24/2014  . Thyroid disease 07/06/2014  . Severe obesity (BMI >= 40) (HCC) 07/06/2014  . HTN (hypertension) 07/06/2014    Drake Leach, PT, DPT  07/22/2020, 5:27 PM  [facility] [address] [place], [place], [address] Phone: [phone]   Fax:  [phone]  Name: Anna Bernard MRN: [id number] Date of Birth: [birth year]

## 2020-07-27 ENCOUNTER — Ambulatory Visit: Payer: Medicare Other | Admitting: Physical Therapy

## 2020-07-27 ENCOUNTER — Encounter: Payer: Self-pay | Admitting: Occupational Therapy

## 2020-07-27 ENCOUNTER — Other Ambulatory Visit: Payer: Self-pay

## 2020-07-27 ENCOUNTER — Encounter: Payer: Self-pay | Admitting: Physical Therapy

## 2020-07-27 ENCOUNTER — Ambulatory Visit: Payer: Medicare Other | Admitting: Occupational Therapy

## 2020-07-27 DIAGNOSIS — R2681 Unsteadiness on feet: Secondary | ICD-10-CM | POA: Diagnosis not present

## 2020-07-27 DIAGNOSIS — R29898 Other symptoms and signs involving the musculoskeletal system: Secondary | ICD-10-CM

## 2020-07-27 DIAGNOSIS — M25621 Stiffness of right elbow, not elsewhere classified: Secondary | ICD-10-CM | POA: Diagnosis not present

## 2020-07-27 DIAGNOSIS — R2689 Other abnormalities of gait and mobility: Secondary | ICD-10-CM | POA: Diagnosis not present

## 2020-07-27 DIAGNOSIS — R278 Other lack of coordination: Secondary | ICD-10-CM

## 2020-07-27 DIAGNOSIS — M25611 Stiffness of right shoulder, not elsewhere classified: Secondary | ICD-10-CM

## 2020-07-27 DIAGNOSIS — R293 Abnormal posture: Secondary | ICD-10-CM | POA: Diagnosis not present

## 2020-07-27 DIAGNOSIS — M6281 Muscle weakness (generalized): Secondary | ICD-10-CM | POA: Diagnosis not present

## 2020-07-27 DIAGNOSIS — R29818 Other symptoms and signs involving the nervous system: Secondary | ICD-10-CM | POA: Diagnosis not present

## 2020-07-27 NOTE — Patient Instructions (Signed)
Access Code: [email or web address] URL: [email or web address] Date: 07/27/2020 Prepared by: Sherlie Ban  Exercises Seated Trunk Rotation - 1 x daily - 5 x weekly - 2 sets - 5 reps

## 2020-07-27 NOTE — Therapy (Signed)
[facility] [address] [place], [place], [address] Phone: [phone]   Fax:  [phone]  Physical Therapy Treatment  Patient Details  Name: Anna Bernard MRN: [id number] Date of Birth: Nov 26, 1950 Referring Provider (PT): Kerin Salen, DO   Encounter Date: 07/27/2020   PT End of Session - 07/27/20 1217    Visit Number 5    Number of Visits 18    Date for PT Re-Evaluation 10/11/20   90 day cert for 9 wk POC   Authorization Type [employer] Medicare    Progress Note Due on Visit 10    PT Start Time 1102    PT Stop Time 1144    PT Time Calculation (min) 42 min    Equipment Utilized During Treatment Gait belt    Activity Tolerance Patient tolerated treatment well    Behavior During Therapy WFL for tasks assessed/performed           Past Medical History:  Diagnosis Date  . Anemia, unspecified   . Hyperlipidemia, unspecified   . Hypertension   . Hypothyroidism, unspecified   . Pre-diabetes   . Vitamin B12 deficiency     Past Surgical History:  Procedure Laterality Date  . ABDOMINAL HYSTERECTOMY    . GIVENS CAPSULE STUDY N/A 04/16/2018   Procedure: GIVENS CAPSULE STUDY;  Surgeon: Jeani Hawking, MD;  Location: [facility] ENDOSCOPY;  Service: Endoscopy;  Laterality: N/A;  . SHOULDER SURGERY      There were no vitals filed for this visit.   Subjective Assessment - 07/27/20 1106    Subjective No changes.Reports the exercises are going well. Felt good after OT.    Pertinent History Dx with Parkinson's disease (Dr. Arbutus Leas) 04/2020 and Sinemet started; PMH of HTN, DM, hypothyroidism, orthostatic hypotension    Patient Stated Goals Get my walk back, be able to do things again.    Currently in Pain? Yes    Pain Score 4     Pain Location Foot    Pain Orientation Right    Pain Descriptors / Indicators Sore    Pain Type Chronic pain    Aggravating Factors  nothing    Pain Relieving Factors taking an aleve                     NMR: Seated PWR moves - at edge of mat, put long aerobic step under BLE.    PWR! Up for improved posture x 10 reps. Cues for technique.  PWR! Rock for improved weighshiftingwith added UE reach 2 x 5 reps B, cues for reaching arm up and in and performing with big hands and spreading fingers wide, pt reporting no shoulder pain when performing.   PWR! Twist for improved trunk rotation - attempted to perform with arms extended and clapping while twisting, however pt with limited trunk rotation, instead performed a modified version with pt twisting with opposite arm and bringing it to side of knee for trunk rotation 2 x 5 reps - added as HEP   PWR! Step for improved step initiationx 10 reps, single step out and in.    Discussed with pt importance of intensity of movement and performing at an intensity level of a 6-7/10 at home.           [facility] Adult PT Treatment/Exercise - 07/27/20 0001      Transfers   Transfers Sit to Stand;Stand to Sit    Sit to Stand 5: Supervision;4: Min guard;With upper extremity assist;From bed  Sit to Stand Details Verbal cues for sequencing;Verbal cues for technique    Sit to Stand Details (indicate cue type and reason) cues for incr forward lean and making sure rollator brakes are locked    Stand to Sit 5: Supervision;With upper extremity assist;To bed    Stand to Sit Details (indicate cue type and reason) Verbal cues for sequencing;Verbal cues for technique    Stand to Sit Details cues to reach back      Ambulation/Gait   Ambulation/Gait Yes    Ambulation/Gait Assistance 5: Supervision    Ambulation/Gait Assistance Details cues for incr step length B to decr foot scuffing and staying close to rollator    Ambulation Distance (Feet) 115 Feet    Assistive device Rollator    Gait Pattern Step-through pattern;Decreased step length - right;Decreased step length - left;Decreased stance time - right;Decreased stance time - left;Decreased dorsiflexion -  right;Decreased dorsiflexion - left;Shuffle;Poor foot clearance - left;Poor foot clearance - right    Ambulation Surface Indoor;Level               Balance Exercises - 07/27/20 0001      Balance Exercises: Standing   SLS Eyes open;Solid surface;Upper extremity support 2;Limitations    SLS Limitations with BUE support alternating foot taps to 4" step x10 reps B, performed same exercise with 6" step    Other Standing Exercises stepping RLE over and back to middle over 2" obstacle with BUE support x10 reps for incr foot clearance and step height    Other Standing Exercises Comments wide BOS lateral weight shifting 2 x 10 reps, cues to push off R toes             PT Education - 07/27/20 1216    Education Details modified seated PWR twist to HEP (seated trunk rotation)    Person(s) Educated Patient    Methods Explanation;Demonstration;Handout;Verbal cues    Comprehension Returned demonstration;Verbalized understanding;Need further instruction            PT Short Term Goals - 07/13/20 2025      PT SHORT TERM GOAL #1   Title Pt will perform HEP with family supervision, for improved balance, stregnth, transfers, and gait.  TARGET 08/14/2020    Time 5    Period Weeks    Status New      PT SHORT TERM GOAL #2   Title Pt will improve 5x sit<>stand to less than or equal to 20 seconds for improved transfer efficiency and functional strength.    Baseline 5x:  24.15    Time 5    Period Weeks    Status New      PT SHORT TERM GOAL #3   Title Berg score to be assessed, with Berg score improved by at least 5 points for decreased fall risk.    Time 5    Period Weeks    Status New      PT SHORT TERM GOAL #4   Title Pt will improve TUG score to less than or equal to 20 seconds for decreased fall risk.    Baseline 28.25 sec    Time 5    Period Weeks    Status New      PT SHORT TERM GOAL #5   Title Pt/husband will verbalize understanding of fall prevention in home environment.     Time 5    Period Weeks    Status New  PT Long Term Goals - 07/13/20 2030      PT LONG TERM GOAL #1   Title Pt will perform progression of HEP, including verbalizing plans for optimal fitness plan upon d/c from PT.  TARGET 09/11/2020    Time 9    Period Weeks    Status New      PT LONG TERM GOAL #2   Title Pt will improve 5x sit<>stand in less than or equal to 15 seconds for improved transfer efficiency and functional strength.    Time 9    Period Weeks    Status New      PT LONG TERM GOAL #3   Title Pt will improve gait velocity to at least 1.8 ft/sec for improved gait efficiency and safety.    Baseline 1.27 ft/sec    Time 9    Period Weeks    Status New      PT LONG TERM GOAL #4   Title Pt will improve TUG score to less than or equal to 15 seconds for decreased fall risk.    Time 9    Period Weeks    Status New      PT LONG TERM GOAL #5   Title Pt will ambulate at least 215 ft in 2 minute walk test, for improved gait efficiency and safety.    Baseline 175 ft    Time 9    Period Weeks    Status New      Additional Long Term Goals   Additional Long Term Goals Yes      PT LONG TERM GOAL #6   Title Pt will verbalize understanding of local Parkinson's disease resources.    Time 9    Period Weeks    Status New                 Plan - 07/27/20 1222    Clinical Impression Statement Performed seated PWR at start of session with pt able to demo improved foot clearance with PWR step and pt reporting it was easier to get into the car today. Added a modified seated PWR twist to HEP due to pt unable to fully perform trunk/hip rotation to twist and clap. Remainder of session focused on gait training with improved step length B and activities for incr foot clearance. Will continue to progress towards LTGs.    Personal Factors and Comorbidities Comorbidity 3+    Comorbidities PMH:  HTN, DM, hypothyroidism, orthostatic hypotension    Examination-Activity  Limitations Locomotion Level;Stand;Transfers    Examination-Participation Restrictions Community Activity;Other   Plans for travel in retirement   Stability/Clinical Decision Making Evolving/Moderate complexity    Rehab Potential Good    PT Frequency 2x / week    PT Duration Other (comment)   9 weeks   PT Treatment/Interventions ADLs/Self Care Home Management;DME Instruction;Neuromuscular re-education;Balance training;Therapeutic exercise;Therapeutic activities;Functional mobility training;Gait training;Patient/family education;Manual techniques    PT Next Visit Plan how is new addition to HEP? standing weight shifting. gait training, exercises to work on single limb stance, foot clearance/step lengt, try SciFit    Consulted and Agree with Plan of Care Patient           Patient will benefit from skilled therapeutic intervention in order to improve the following deficits and impairments:  Abnormal gait, Difficulty walking, Decreased balance, Decreased mobility, Decreased strength, Postural dysfunction  Visit Diagnosis: Other symptoms and signs involving the nervous system  Other lack of coordination  Other symptoms and signs involving the musculoskeletal  system  Unsteadiness on feet     Problem List Patient Active Problem List   Diagnosis Date Noted  . Pain due to onychomycosis of toenails of both feet 06/17/2020  . Parkinson's disease (HCC) 04/23/2020  . Leg swelling 11/26/2019  . Chronic venous insufficiency 11/26/2019  . Vitamin B12 deficiency 02/06/2018  . Sepsis secondary to UTI (HCC) 01/29/2018  . Microcytic anemia 01/29/2018  . Renal insufficiency 01/29/2018  . Pure hypercholesterolemia 12/24/2014  . Diabetes mellitus type 2, controlled, without complications (HCC) 12/24/2014  . Bruit of right carotid artery 12/24/2014  . Thyroid disease 07/06/2014  . Severe obesity (BMI >= 40) (HCC) 07/06/2014  . HTN (hypertension) 07/06/2014    Drake Leach, PT, DPT   07/27/2020, 12:26 PM  [facility] [address] [place], [place], [address] Phone: [phone]   Fax:  [phone]  Name: Anna Bernard MRN: [id number] Date of Birth: 06-05-1951

## 2020-07-27 NOTE — Therapy (Signed)
[facility] [address] [place], [place], [address] Phone: [phone]   Fax:  [phone]  Occupational Therapy Treatment  Patient Details  Name: Anna Bernard MRN: [id number] Date of Birth: Jun 19, 1951 Referring Provider (OT): Dr. Lurena Joiner Tat   Encounter Date: 07/27/2020   OT End of Session - 07/27/20 0947    Visit Number 2    Number of Visits 17    Date for OT Re-Evaluation 09/11/20    Authorization Type Medicare UHC:  no visit limit or auth required    Authorization Time Period cert. date 07/13/20-10/11/20    Authorization - Visit Number 2    Authorization - Number of Visits 10    Progress Note Due on Visit 10    OT Start Time 0940    OT Stop Time 1020    OT Time Calculation (min) 40 min    Activity Tolerance Patient tolerated treatment well    Behavior During Therapy WFL for tasks assessed/performed           Past Medical History:  Diagnosis Date  . Anemia, unspecified   . Hyperlipidemia, unspecified   . Hypertension   . Hypothyroidism, unspecified   . Pre-diabetes   . Vitamin B12 deficiency     Past Surgical History:  Procedure Laterality Date  . ABDOMINAL HYSTERECTOMY    . GIVENS CAPSULE STUDY N/A 04/16/2018   Procedure: GIVENS CAPSULE STUDY;  Surgeon: Jeani Hawking, MD;  Location: [facility] ENDOSCOPY;  Service: Endoscopy;  Laterality: N/A;  . SHOULDER SURGERY      There were no vitals filed for this visit.   Subjective Assessment - 07/27/20 0942    Subjective  "I miss my husband... we had so many plans"    Pertinent History newly diagnosed PD (in last month).   PMH:  anemia, hyperlipidemia, HTN, hypothyroidism, Vitamin B12 deficiency, hx of R RTC surgery, leg swelling, arthritis (per pt), obesity, orthostatic hypotension    Limitations fall risk    Patient Stated Goals be able to move better and travel (to [place] first)    Currently in Pain? Yes    Pain Score 4     Pain Location Foot    Pain  Orientation Right    Pain Descriptors / Indicators Sore    Pain Type Chronic pain    Pain Onset More than a month ago    Pain Frequency Intermittent    Aggravating Factors  walking, cold weather    Pain Relieving Factors heat              Practiced supine>sitting using large amplitude movement strategies/PWR! Moves with mod cueing/min A.  Pt reports that she pulls on bedrail at home.  Sitting, functional reaching with RUE with trunk rotation and wt. Shift with min cueing for large amplitude movements.       OT Education - 07/27/20 1454    Education Details PWR! moves in Supine (basic 4)    Person(s) Educated Patient    Methods Explanation;Demonstration;Verbal cues;Handout;Tactile cues    Comprehension Verbalized understanding;Returned demonstration;Verbal cues required;Tactile cues required;Need further instruction            OT Short Term Goals - 07/13/20 2152      OT SHORT TERM GOAL #1   Title Pt will be independent with PD-specific HEP--check STGs 09/11/20    Time 4    Period Weeks    Status New      OT SHORT TERM GOAL #2   Title Pt  will demo at least 110* R shoulder flex with -40* elbow ext for functional reach.    Baseline 100* with -45* elbow ext    Time 4    Period Weeks    Status New      OT SHORT TERM GOAL #3   Title Pt will improve R hand coordination for ADLs as shown by improving time on 9-hole peg test by at least 6sec.    Baseline 56.84sec    Time 4    Period Weeks    Status New      OT SHORT TERM GOAL #4   Title Assess PPT#4 and establish goal.    Time 4    Period Weeks    Status New      OT SHORT TERM GOAL #5   Title Pt will verbalize understanding of appropriate community resources and ways to decr risk of future complications.    Time 4    Period Weeks    Status New             OT Long Term Goals - 07/13/20 2158      OT LONG TERM GOAL #1   Title Pt will verbalize understanding of adaptive strategies to incr ease, safety, and  independence with ADLs and IADLs.--check LTGs 10/11/20    Time 8    Period Weeks    Status New      OT LONG TERM GOAL #2   Title Pt will demo at least 115* R shoulder flex with -35* elbow ext for functional reach.    Baseline 100* with -45* elbow ext    Time 8    Period Weeks    Status New      OT LONG TERM GOAL #3   Title Pt will perform UB dressing consistently mod I.    Time 8    Period Weeks    Status New      OT LONG TERM GOAL #4   Title Pt will perform LB dressing consistently mod I.    Time 8    Period Weeks    Status New      OT LONG TERM GOAL #5   Title Pt willPt will improve R hand coordination for ADLs as shown by improving time on 9-hole peg test by at least 10sec.    Baseline 56.84sec    Time 8    Period Weeks    Status New      OT LONG TERM GOAL #6   Title Pt will improved RUE functional reaching and coordination for ADLs as shown by improving score on box and blocks test by at least 5 blocks.    Baseline R-31 blocks, L-40 blocks    Time 8    Period Weeks    Status New                 Plan - 07/27/20 1053    Clinical Impression Statement Pt responds to cueing for large amplitude movements and does better with repetition, but continues to be hesitant in movement.    OT Occupational Profile and History Detailed Assessment- Review of Records and additional review of physical, cognitive, psychosocial history related to current functional performance    Occupational performance deficits (Please refer to evaluation for details): ADL's;IADL's;Social Participation;Leisure    Body Structure / Function / Physical Skills ADL;Balance;Coordination;Decreased knowledge of use of DME;GMC;UE functional use;FMC;Tone;Mobility;Flexibility;IADL;ROM;Dexterity;Body mechanics    Rehab Potential Good    Clinical Decision Making Several treatment options, min-mod task  modification necessary    Comorbidities Affecting Occupational Performance: May have comorbidities impacting  occupational performance    Modification or Assistance to Complete Evaluation  Min-Moderate modification of tasks or assist with assess necessary to complete eval    OT Frequency 2x / week    OT Duration 8 weeks   +eval or 17 visits   OT Treatment/Interventions Self-care/ADL training;Moist Heat;Aquatic Therapy;Therapeutic activities;Balance training;DME and/or AE instruction;Therapeutic exercise;Passive range of motion;Functional Mobility Training;Neuromuscular education;Cryotherapy;Manual Therapy;Energy conservation;Patient/family education;Ultrasound    Plan review PWR! supine, work supine>sitting, assess PPT#4 and set goal, assess standing functional reach    Consulted and Agree with Plan of Care Patient;Family member/caregiver    Family Member Consulted husband           Patient will benefit from skilled therapeutic intervention in order to improve the following deficits and impairments:   Body Structure / Function / Physical Skills: ADL, Balance, Coordination, Decreased knowledge of use of DME, GMC, UE functional use, FMC, Tone, Mobility, Flexibility, IADL, ROM, Dexterity, Body mechanics       Visit Diagnosis: Other symptoms and signs involving the nervous system  Other symptoms and signs involving the musculoskeletal system  Other lack of coordination  Stiffness of right shoulder, not elsewhere classified  Stiffness of right elbow, not elsewhere classified  Abnormal posture  Unsteadiness on feet  Other abnormalities of gait and mobility    Problem List Patient Active Problem List   Diagnosis Date Noted  . Pain due to onychomycosis of toenails of both feet 06/17/2020  . Parkinson's disease (HCC) 04/23/2020  . Leg swelling 11/26/2019  . Chronic venous insufficiency 11/26/2019  . Vitamin B12 deficiency 02/06/2018  . Sepsis secondary to UTI (HCC) 01/29/2018  . Microcytic anemia 01/29/2018  . Renal insufficiency 01/29/2018  . Pure hypercholesterolemia 12/24/2014  .  Diabetes mellitus type 2, controlled, without complications (HCC) 12/24/2014  . Bruit of right carotid artery 12/24/2014  . Thyroid disease 07/06/2014  . Severe obesity (BMI >= 40) (HCC) 07/06/2014  . HTN (hypertension) 07/06/2014    [facility] 07/27/2020, 2:56 PM  [facility] [address] [place], [place], [address] Phone: [phone]   Fax:  [phone]  Name: KEALA DRUM MRN: [id number] Date of Birth: 11/03/1950   Willa Frater, OTR/L [facility] [address] [place], [place]  [address] [phone] phone [phone] 07/27/20 2:56 PM

## 2020-07-28 DIAGNOSIS — N390 Urinary tract infection, site not specified: Secondary | ICD-10-CM | POA: Diagnosis not present

## 2020-07-29 ENCOUNTER — Other Ambulatory Visit: Payer: Self-pay

## 2020-07-29 ENCOUNTER — Ambulatory Visit: Payer: Medicare Other | Admitting: Physical Therapy

## 2020-07-29 DIAGNOSIS — M25611 Stiffness of right shoulder, not elsewhere classified: Secondary | ICD-10-CM | POA: Diagnosis not present

## 2020-07-29 DIAGNOSIS — R29818 Other symptoms and signs involving the nervous system: Secondary | ICD-10-CM | POA: Diagnosis not present

## 2020-07-29 DIAGNOSIS — M6281 Muscle weakness (generalized): Secondary | ICD-10-CM

## 2020-07-29 DIAGNOSIS — R2689 Other abnormalities of gait and mobility: Secondary | ICD-10-CM | POA: Diagnosis not present

## 2020-07-29 DIAGNOSIS — R29898 Other symptoms and signs involving the musculoskeletal system: Secondary | ICD-10-CM | POA: Diagnosis not present

## 2020-07-29 DIAGNOSIS — R278 Other lack of coordination: Secondary | ICD-10-CM | POA: Diagnosis not present

## 2020-07-29 DIAGNOSIS — R293 Abnormal posture: Secondary | ICD-10-CM | POA: Diagnosis not present

## 2020-07-29 DIAGNOSIS — R2681 Unsteadiness on feet: Secondary | ICD-10-CM | POA: Diagnosis not present

## 2020-07-29 DIAGNOSIS — M25621 Stiffness of right elbow, not elsewhere classified: Secondary | ICD-10-CM | POA: Diagnosis not present

## 2020-07-29 NOTE — Therapy (Signed)
[facility] [address] [place], [place], [address] Phone: [phone]   Fax:  [phone]  Physical Therapy Treatment  Patient Details  Name: Anna Bernard MRN: [id number] Date of Birth: [birth year] Referring Provider (PT): Kerin Salen, DO   Encounter Date: 07/29/2020   PT End of Session - 07/29/20 1033    Visit Number 6    Number of Visits 18    Date for PT Re-Evaluation 10/11/20   90 day cert for 9 wk POC   Authorization Type [employer] Medicare    Progress Note Due on Visit 10    PT Start Time 0933    PT Stop Time 1016    PT Time Calculation (min) 43 min    Equipment Utilized During Treatment Gait belt    Activity Tolerance Patient tolerated treatment well    Behavior During Therapy [facility] for tasks assessed/performed           Past Medical History:  Diagnosis Date  . Anemia, unspecified   . Hyperlipidemia, unspecified   . Hypertension   . Hypothyroidism, unspecified   . Pre-diabetes   . Vitamin B12 deficiency     Past Surgical History:  Procedure Laterality Date  . ABDOMINAL HYSTERECTOMY    . GIVENS CAPSULE STUDY N/A 04/16/2018   Procedure: GIVENS CAPSULE STUDY;  Surgeon: Jeani Hawking, MD;  Location: [facility] ENDOSCOPY;  Service: Endoscopy;  Laterality: N/A;  . SHOULDER SURGERY      There were no vitals filed for this visit.   Subjective Assessment - 07/29/20 0936    Subjective Requesting having exercises re-printed out so she knows what order to do them in. Nothing else new.    Pertinent History Dx with Parkinson's disease (Dr. Arbutus Leas) 04/2020 and Sinemet started; PMH of HTN, DM, hypothyroidism, orthostatic hypotension    Patient Stated Goals Get my walk back, be able to do things again.    Currently in Pain? Yes    Pain Score 4     Pain Location Foot    Pain Orientation Right    Pain Descriptors / Indicators Sore    Pain Type Chronic pain   6 months   Aggravating Factors  nothing    Pain Relieving Factors  taking an aleve               Pt requesting to review entirety of HEP as well as receive a new printout - going through order of exercises and when she should do them, also provided pt with folder to keep her exercises in.    NMR: Seated PWR moves - at edge of mat, put long aerobic step under BLE.    PWR! Up for improved posture 2x 10 reps. Cues for technique and intensity level. 2nd set pt working at a 7/10 and feeling much better - discussed importance of working at this intensity level at home.   PWR! Rock for improved weighshiftingwith added UE reach2 x 5 reps B, cues for reaching arm up and in and performing with big hands and spreading fingers wide, pt reporting no shoulder pain when performing.  PWR! Twist for improved trunk rotation -  instead performed a modified version with pt twisting with opposite arm and bringing it to side of knee for trunk rotation 2 x 5 reps - pt reporting feeling good after.   PWR! Step for improved step initiationx 10 reps, single step out and in.   Discussed with pt importance of intensity of movement and performing at  an intensity level of a 6-7/10 at home.Pt unsure of what chair to use at home, discussed using a sturdy chair without arm rests would be best - like a dining room chair. Pt reports she may have to find a new chair to use that is more sturdy.    Also reviewed seated marching x10 reps B.             [facility] Adult PT Treatment/Exercise - 07/29/20 1035      Transfers   Transfers Sit to Stand;Stand to Sit    Sit to Stand 5: Supervision;With upper extremity assist;From bed    Sit to Stand Details (indicate cue type and reason) initial cues for incr forward lean    Stand to Sit 5: Supervision;To chair/3-in-1;With upper extremity assist    Transfer Cueing pt with improvement with proper brake management today with rollator    Comments x10 reps sit <> stand from mat table (reviewed from HEP) - cues for tall posture once  in standing       Ambulation/Gait   Ambulation/Gait Yes    Ambulation/Gait Assistance 5: Supervision    Ambulation/Gait Assistance Details initial cues for incr step length B to decr scuffing and staying close to rollator, pt with improvement in maintaining longer stride length    Ambulation Distance (Feet) 200 Feet    Assistive device Rollator    Gait Pattern Step-through pattern;Decreased step length - right;Decreased step length - left;Decreased stance time - right;Decreased stance time - left;Decreased dorsiflexion - right;Decreased dorsiflexion - left;Shuffle;Poor foot clearance - left;Poor foot clearance - right    Ambulation Surface Level;Indoor      Therapeutic Activites    Therapeutic Activities Other Therapeutic Activities    Other Therapeutic Activities Pt reporting continued R foot pain - therapist had pt take off her shoe to assess, upon palpation pt reporting no pain and no areas of incr redness/pressure. Pt's R ankle is more swollen than L, pt reports that her doctor is aware and it has been going on for 6 months. Therapist encouraged pt to talk about this continued pain with her new PCP that she sees at the end of the month. Pt reports that she spends most of the day in bed/sitting down, encouraged pt to incr activity at home by getting up and practicing her walking down the hallway taking long steps or doing her exercises to incr movement at home, as pt reports feeling better afterwards, pt in agreement and verbalized understanding. At end of session pt reporting feeling "much better and doesn't feel like crying anymore", pt reports she feels like this regarding her recent diagnosis, therapist also encouraged pt to let her PCP know about these feelings in upcoming appointment, with pt in agreement.                   PT Education - 07/29/20 1033    Education Details reviewed HEP (upon pt request), importance of incr activity at home, see TA.    Person(s) Educated Patient     Methods Explanation;Demonstration;Handout;Verbal cues    Comprehension Verbalized understanding;Returned demonstration            PT Short Term Goals - 07/13/20 2025      PT SHORT TERM GOAL #1   Title Pt will perform HEP with family supervision, for improved balance, stregnth, transfers, and gait.  TARGET 08/14/2020    Time 5    Period Weeks    Status New      PT SHORT  TERM GOAL #2   Title Pt will improve 5x sit<>stand to less than or equal to 20 seconds for improved transfer efficiency and functional strength.    Baseline 5x:  24.15    Time 5    Period Weeks    Status New      PT SHORT TERM GOAL #3   Title Berg score to be assessed, with Berg score improved by at least 5 points for decreased fall risk.    Time 5    Period Weeks    Status New      PT SHORT TERM GOAL #4   Title Pt will improve TUG score to less than or equal to 20 seconds for decreased fall risk.    Baseline 28.25 sec    Time 5    Period Weeks    Status New      PT SHORT TERM GOAL #5   Title Pt/husband will verbalize understanding of fall prevention in home environment.    Time 5    Period Weeks    Status New             PT Long Term Goals - 07/13/20 2030      PT LONG TERM GOAL #1   Title Pt will perform progression of HEP, including verbalizing plans for optimal fitness plan upon d/c from PT.  TARGET 09/11/2020    Time 9    Period Weeks    Status New      PT LONG TERM GOAL #2   Title Pt will improve 5x sit<>stand in less than or equal to 15 seconds for improved transfer efficiency and functional strength.    Time 9    Period Weeks    Status New      PT LONG TERM GOAL #3   Title Pt will improve gait velocity to at least 1.8 ft/sec for improved gait efficiency and safety.    Baseline 1.27 ft/sec    Time 9    Period Weeks    Status New      PT LONG TERM GOAL #4   Title Pt will improve TUG score to less than or equal to 15 seconds for decreased fall risk.    Time 9    Period Weeks     Status New      PT LONG TERM GOAL #5   Title Pt will ambulate at least 215 ft in 2 minute walk test, for improved gait efficiency and safety.    Baseline 175 ft    Time 9    Period Weeks    Status New      Additional Long Term Goals   Additional Long Term Goals Yes      PT LONG TERM GOAL #6   Title Pt will verbalize understanding of local Parkinson's disease resources.    Time 9    Period Weeks    Status New                 Plan - 07/29/20 1050    Clinical Impression Statement Today's session focused on reviewing entirety of pt's HEP (upon pt's request). Pt needing cues for technique and cues for incr intensity of movement - with pt reporting feeling better after working at a 7/10. Encouraged incr activity at home via exercises or walking down hallway with rollator with long strides every 1-2 hrs as pt reports spending the majority of the day sitting or lying down. Will continue to progress towards LTGs.  Personal Factors and Comorbidities Comorbidity 3+    Comorbidities PMH:  HTN, DM, hypothyroidism, orthostatic hypotension    Examination-Activity Limitations Locomotion Level;Stand;Transfers    Examination-Participation Restrictions Community Activity;Other   Plans for travel in retirement   Stability/Clinical Decision Making Evolving/Moderate complexity    Rehab Potential Good    PT Frequency 2x / week    PT Duration Other (comment)   9 weeks   PT Treatment/Interventions ADLs/Self Care Home Management;DME Instruction;Neuromuscular re-education;Balance training;Therapeutic exercise;Therapeutic activities;Functional mobility training;Gait training;Patient/family education;Manual techniques    PT Next Visit Plan is pt doing HEP and incr activity at home?  gait training, standing weight shifting, exercises to work on single limb stance, foot clearance/step lengt, try SciFit    Consulted and Agree with Plan of Care Patient           Patient will benefit from skilled  therapeutic intervention in order to improve the following deficits and impairments:  Abnormal gait, Difficulty walking, Decreased balance, Decreased mobility, Decreased strength, Postural dysfunction  Visit Diagnosis: Other symptoms and signs involving the nervous system  Other lack of coordination  Unsteadiness on feet  Muscle weakness (generalized)     Problem List Patient Active Problem List   Diagnosis Date Noted  . Pain due to onychomycosis of toenails of both feet 06/17/2020  . Parkinson's disease (HCC) 04/23/2020  . Leg swelling 11/26/2019  . Chronic venous insufficiency 11/26/2019  . Vitamin B12 deficiency 02/06/2018  . Sepsis secondary to UTI (HCC) 01/29/2018  . Microcytic anemia 01/29/2018  . Renal insufficiency 01/29/2018  . Pure hypercholesterolemia 12/24/2014  . Diabetes mellitus type 2, controlled, without complications (HCC) 12/24/2014  . Bruit of right carotid artery 12/24/2014  . Thyroid disease 07/06/2014  . Severe obesity (BMI >= 40) (HCC) 07/06/2014  . HTN (hypertension) 07/06/2014    Drake Leach, PT,DPT  07/29/2020, 10:52 AM  [facility] [address] [place], [place], [address] Phone: [phone]   Fax:  [phone]  Name: Anna Bernard MRN: [id number] Date of Birth: 01-23-1951

## 2020-08-04 ENCOUNTER — Ambulatory Visit: Payer: Medicare Other | Admitting: Physical Therapy

## 2020-08-04 ENCOUNTER — Other Ambulatory Visit: Payer: Self-pay

## 2020-08-04 DIAGNOSIS — R29818 Other symptoms and signs involving the nervous system: Secondary | ICD-10-CM | POA: Diagnosis not present

## 2020-08-04 DIAGNOSIS — R2681 Unsteadiness on feet: Secondary | ICD-10-CM | POA: Diagnosis not present

## 2020-08-04 DIAGNOSIS — M6281 Muscle weakness (generalized): Secondary | ICD-10-CM | POA: Diagnosis not present

## 2020-08-04 DIAGNOSIS — R2689 Other abnormalities of gait and mobility: Secondary | ICD-10-CM

## 2020-08-04 DIAGNOSIS — R278 Other lack of coordination: Secondary | ICD-10-CM | POA: Diagnosis not present

## 2020-08-04 DIAGNOSIS — R29898 Other symptoms and signs involving the musculoskeletal system: Secondary | ICD-10-CM | POA: Diagnosis not present

## 2020-08-04 DIAGNOSIS — M25611 Stiffness of right shoulder, not elsewhere classified: Secondary | ICD-10-CM | POA: Diagnosis not present

## 2020-08-04 DIAGNOSIS — M25621 Stiffness of right elbow, not elsewhere classified: Secondary | ICD-10-CM | POA: Diagnosis not present

## 2020-08-04 DIAGNOSIS — R293 Abnormal posture: Secondary | ICD-10-CM | POA: Diagnosis not present

## 2020-08-04 NOTE — Therapy (Signed)
[facility] [address] [place], [place], [address] Phone: [phone]   Fax:  [phone]  Physical Therapy Treatment  Patient Details  Name: Anna Bernard MRN: [id number] Date of Birth: 08-May-1951 Referring Provider (PT): Kerin Salen, DO   Encounter Date: 08/04/2020   PT End of Session - 08/04/20 1025    Visit Number 7    Number of Visits 18    Date for PT Re-Evaluation 10/11/20   90 day cert for 9 wk POC   Authorization Type [employer] Medicare    Progress Note Due on Visit 10    PT Start Time 0932    PT Stop Time 1016    PT Time Calculation (min) 44 min    Equipment Utilized During Treatment Gait belt    Activity Tolerance Patient tolerated treatment well    Behavior During Therapy [facility] for tasks assessed/performed           Past Medical History:  Diagnosis Date  . Anemia, unspecified   . Hyperlipidemia, unspecified   . Hypertension   . Hypothyroidism, unspecified   . Pre-diabetes   . Vitamin B12 deficiency     Past Surgical History:  Procedure Laterality Date  . ABDOMINAL HYSTERECTOMY    . GIVENS CAPSULE STUDY N/A 04/16/2018   Procedure: GIVENS CAPSULE STUDY;  Surgeon: Jeani Hawking, MD;  Location: [facility] ENDOSCOPY;  Service: Endoscopy;  Laterality: N/A;  . SHOULDER SURGERY      There were no vitals filed for this visit.   Subjective Assessment - 08/04/20 0936    Subjective Just hurting all over.  Typical because of the arthritis.  Did the exercises 3 times since last PT visit.    Pertinent History Dx with Parkinson's disease (Dr. Arbutus Leas) 04/2020 and Sinemet started; PMH of HTN, DM, hypothyroidism, orthostatic hypotension    Patient Stated Goals Get my walk back, be able to do things again.    Currently in Pain? Yes    Pain Score 4     Pain Location Generalized    Pain Descriptors / Indicators Aching;Sore    Pain Type Chronic pain    Pain Onset More than a month ago    Pain Frequency Constant     Aggravating Factors  nothing    Pain Relieving Factors taking Aleve                             [facility] Adult PT Treatment/Exercise - 08/04/20 0001      Transfers   Transfers Sit to Stand;Stand to Sit    Sit to Stand 5: Supervision;With upper extremity assist;From bed;From chair/3-in-1    Stand to Sit 5: Supervision;To chair/3-in-1;With upper extremity assist    Number of Reps Other reps (comment)   x 5 reps throughout session     Ambulation/Gait   Ambulation/Gait Yes    Ambulation/Gait Assistance 5: Supervision    Ambulation/Gait Assistance Details Gait in gym 40 ft, then turn, x 6 rep (2:30) using rollator, simulating pt's distance between living area and kitchen at home.      Ambulation Distance (Feet) 200 Feet   60; 150; 40 ft x 2   Assistive device Rollator    Gait Pattern Step-through pattern;Decreased step length - right;Decreased step length - left;Decreased stance time - right;Decreased stance time - left;Decreased dorsiflexion - right;Decreased dorsiflexion - left;Shuffle;Poor foot clearance - left;Poor foot clearance - right    Ambulation Surface Level;Indoor  Gait Comments Provided written instruction in pt's HEP folder and discussed walking 2-3 minutes, 3x/day with rollator, for improved mobility/activity level at home.      Exercises   Exercises Knee/Hip      Knee/Hip Exercises: Aerobic   Stepper Seated SciFit Stepper, Level 1.2, 4 extremities, x 8 minutes, for flexibility and strength as warm up.  Initial RPM 33, then with cues to increase, pt able to get up to 48 RPM, then needs cues for longer strides to keep that pace.               Balance Exercises - 08/04/20 0001      Balance Exercises: Standing   SLS with Vectors Solid surface;Upper extremity assist 2;Other reps (comment);Limitations   Alt step taps to cabinet shelf x 10   SLS with Vectors Limitations Alt forward step taps to balance disc in front of patient, BUE support at  counter/locked rollator with min guard    Other Standing Exercises Comments wide BOS lateral weight shifting x 10 reps. stagger stance weightshifting x 10 reps anterior/posterior with BUE support at counter.               PT Education - 08/04/20 1024    Education Details Walking at home 2-3 minutes, 3times per day    Person(s) Educated Patient    Methods Explanation;Demonstration;Handout    Comprehension Verbalized understanding;Returned demonstration;Verbal cues required            PT Short Term Goals - 07/13/20 2025      PT SHORT TERM GOAL #1   Title Pt will perform HEP with family supervision, for improved balance, stregnth, transfers, and gait.  TARGET 08/14/2020    Time 5    Period Weeks    Status New      PT SHORT TERM GOAL #2   Title Pt will improve 5x sit<>stand to less than or equal to 20 seconds for improved transfer efficiency and functional strength.    Baseline 5x:  24.15    Time 5    Period Weeks    Status New      PT SHORT TERM GOAL #3   Title Berg score to be assessed, with Berg score improved by at least 5 points for decreased fall risk.    Time 5    Period Weeks    Status New      PT SHORT TERM GOAL #4   Title Pt will improve TUG score to less than or equal to 20 seconds for decreased fall risk.    Baseline 28.25 sec    Time 5    Period Weeks    Status New      PT SHORT TERM GOAL #5   Title Pt/husband will verbalize understanding of fall prevention in home environment.    Time 5    Period Weeks    Status New             PT Long Term Goals - 07/13/20 2030      PT LONG TERM GOAL #1   Title Pt will perform progression of HEP, including verbalizing plans for optimal fitness plan upon d/c from PT.  TARGET 09/11/2020    Time 9    Period Weeks    Status New      PT LONG TERM GOAL #2   Title Pt will improve 5x sit<>stand in less than or equal to 15 seconds for improved transfer efficiency and functional strength.    Time 9  Period Weeks     Status New      PT LONG TERM GOAL #3   Title Pt will improve gait velocity to at least 1.8 ft/sec for improved gait efficiency and safety.    Baseline 1.27 ft/sec    Time 9    Period Weeks    Status New      PT LONG TERM GOAL #4   Title Pt will improve TUG score to less than or equal to 15 seconds for decreased fall risk.    Time 9    Period Weeks    Status New      PT LONG TERM GOAL #5   Title Pt will ambulate at least 215 ft in 2 minute walk test, for improved gait efficiency and safety.    Baseline 175 ft    Time 9    Period Weeks    Status New      Additional Long Term Goals   Additional Long Term Goals Yes      PT LONG TERM GOAL #6   Title Pt will verbalize understanding of local Parkinson's disease resources.    Time 9    Period Weeks    Status New                 Plan - 08/04/20 1025    Clinical Impression Statement Pt reports doing HEP 3x since last PT visit.  Focus of today's session on use of aerobic machines for warm-up (with focus on intensity); gait and standing balance.  Short distance gait and turns simulating pt's home distances, and encouraged pt to increase walking activity at home.    Personal Factors and Comorbidities Comorbidity 3+    Comorbidities PMH:  HTN, DM, hypothyroidism, orthostatic hypotension    Examination-Activity Limitations Locomotion Level;Stand;Transfers    Examination-Participation Restrictions Community Activity;Other   Plans for travel in retirement   Stability/Clinical Decision Making Evolving/Moderate complexity    Rehab Potential Good    PT Frequency 2x / week    PT Duration Other (comment)   9 weeks   PT Treatment/Interventions ADLs/Self Care Home Management;DME Instruction;Neuromuscular re-education;Balance training;Therapeutic exercise;Therapeutic activities;Functional mobility training;Gait training;Patient/family education;Manual techniques    PT Next Visit Plan Review seated HEP; gait training, standing weight  shifting, exercises to work on single limb stance, foot clearance/step lengt, try SciFit again for warm-up    Consulted and Agree with Plan of Care Patient           Patient will benefit from skilled therapeutic intervention in order to improve the following deficits and impairments:  Abnormal gait, Difficulty walking, Decreased balance, Decreased mobility, Decreased strength, Postural dysfunction  Visit Diagnosis: Muscle weakness (generalized)  Other abnormalities of gait and mobility  Unsteadiness on feet     Problem List Patient Active Problem List   Diagnosis Date Noted  . Pain due to onychomycosis of toenails of both feet 06/17/2020  . Parkinson's disease (HCC) 04/23/2020  . Leg swelling 11/26/2019  . Chronic venous insufficiency 11/26/2019  . Vitamin B12 deficiency 02/06/2018  . Sepsis secondary to UTI (HCC) 01/29/2018  . Microcytic anemia 01/29/2018  . Renal insufficiency 01/29/2018  . Pure hypercholesterolemia 12/24/2014  . Diabetes mellitus type 2, controlled, without complications (HCC) 12/24/2014  . Bruit of right carotid artery 12/24/2014  . Thyroid disease 07/06/2014  . Severe obesity (BMI >= 40) (HCC) 07/06/2014  . HTN (hypertension) 07/06/2014    Tell Rozelle W. 08/04/2020, 10:29 AM  Gean Maidens., PT   [facility] [address] [place], [place], [address] Phone: [phone]   Fax:  [phone]  Name: Anna Bernard MRN: [id number] Date of Birth: 13-Aug-1951

## 2020-08-04 NOTE — Patient Instructions (Signed)
   WALK 2-3 minutes, 3 times per day (in home with rollator)

## 2020-08-07 ENCOUNTER — Ambulatory Visit: Payer: Medicare Other | Admitting: Occupational Therapy

## 2020-08-07 ENCOUNTER — Ambulatory Visit: Payer: Medicare Other | Admitting: Physical Therapy

## 2020-08-07 ENCOUNTER — Other Ambulatory Visit: Payer: Self-pay

## 2020-08-07 ENCOUNTER — Encounter: Payer: Self-pay | Admitting: Occupational Therapy

## 2020-08-07 DIAGNOSIS — R2689 Other abnormalities of gait and mobility: Secondary | ICD-10-CM

## 2020-08-07 DIAGNOSIS — M6281 Muscle weakness (generalized): Secondary | ICD-10-CM

## 2020-08-07 DIAGNOSIS — R2681 Unsteadiness on feet: Secondary | ICD-10-CM

## 2020-08-07 DIAGNOSIS — R278 Other lack of coordination: Secondary | ICD-10-CM

## 2020-08-07 DIAGNOSIS — R29898 Other symptoms and signs involving the musculoskeletal system: Secondary | ICD-10-CM

## 2020-08-07 DIAGNOSIS — M25621 Stiffness of right elbow, not elsewhere classified: Secondary | ICD-10-CM | POA: Diagnosis not present

## 2020-08-07 DIAGNOSIS — R293 Abnormal posture: Secondary | ICD-10-CM | POA: Diagnosis not present

## 2020-08-07 DIAGNOSIS — R29818 Other symptoms and signs involving the nervous system: Secondary | ICD-10-CM

## 2020-08-07 DIAGNOSIS — M25611 Stiffness of right shoulder, not elsewhere classified: Secondary | ICD-10-CM | POA: Diagnosis not present

## 2020-08-07 NOTE — Therapy (Signed)
[facility] [address] [place], [place], [address] Phone: [phone]   Fax:  [phone]  Occupational Therapy Treatment  Patient Details  Name: Anna Bernard MRN: [id number] Date of Birth: 23-May-1951 Referring Provider (OT): Dr. Lurena Joiner Tat   Encounter Date: 08/07/2020   OT End of Session - 08/07/20 0903    Visit Number 3    Number of Visits 17    Date for OT Re-Evaluation 09/11/20    Authorization Type Medicare UHC:  no visit limit or auth required    Authorization Time Period cert. date 07/13/20-10/11/20    Authorization - Number of Visits 10    Progress Note Due on Visit 10    OT Start Time 0847    OT Stop Time 0927    OT Time Calculation (min) 40 min    Activity Tolerance Patient tolerated treatment well    Behavior During Therapy [facility] for tasks assessed/performed           Past Medical History:  Diagnosis Date  . Anemia, unspecified   . Hyperlipidemia, unspecified   . Hypertension   . Hypothyroidism, unspecified   . Pre-diabetes   . Vitamin B12 deficiency     Past Surgical History:  Procedure Laterality Date  . ABDOMINAL HYSTERECTOMY    . GIVENS CAPSULE STUDY N/A 04/16/2018   Procedure: GIVENS CAPSULE STUDY;  Surgeon: Jeani Hawking, MD;  Location: [facility] ENDOSCOPY;  Service: Endoscopy;  Laterality: N/A;  . SHOULDER SURGERY      There were no vitals filed for this visit.   Subjective Assessment - 08/07/20 0853    Subjective  Pt reports pain in foot    Pertinent History newly diagnosed PD (in last month).   PMH:  anemia, hyperlipidemia, HTN, hypothyroidism, Vitamin B12 deficiency, hx of R RTC surgery, leg swelling, arthritis (per pt), obesity, orthostatic hypotension    Limitations fall risk    Patient Stated Goals be able to move better and travel (to [place] first)    Currently in Pain? Yes    Pain Score 4     Pain Location Generalized    Pain Orientation Right    Pain Descriptors / Indicators  Aching    Pain Type Chronic pain    Pain Onset More than a month ago    Pain Frequency Constant    Aggravating Factors  unknown    Pain Relieving Factors meds                                OT  Treatment/Education - 08/07/20 0902    Education Details PWR! moves in Supine (basic 4), min v.c for larger amplitude movements, stategies for sit-stand and donning doffing jacket with big movements, mod v.c and min A, (reveiwed several trials) therapist recommends pt donns jacket in seated initally at home for falls    Person(s) Educated Patient    Methods Explanation;Demonstration;Verbal cues;Tactile cues    Comprehension Verbalized understanding;Returned demonstration;Verbal cues required;Tactile cues required            OT Short Term Goals - 08/07/20 0910      OT SHORT TERM GOAL #1   Title Pt will be independent with PD-specific HEP--check STGs 09/11/20    Time 4    Period Weeks    Status New      OT SHORT TERM GOAL #2   Title Pt will demo at least 110* R shoulder flex  with -40* elbow ext for functional reach.    Baseline 100* with -45* elbow ext    Time 4    Period Weeks    Status New      OT SHORT TERM GOAL #3   Title Pt will improve R hand coordination for ADLs as shown by improving time on 9-hole peg test by at least 6sec.    Baseline 56.84sec    Time 4    Period Weeks    Status New      OT SHORT TERM GOAL #4   Title Pt willdemonstrate improved ease with dressing as evidenced by  performing  PPT#4 donning/ doffing jacket in 1 min 15 secs    Baseline 1 min 29 secs    Time 4    Period Weeks    Status New      OT SHORT TERM GOAL #5   Title Pt will verbalize understanding of appropriate community resources and ways to decr risk of future complications.    Time 4    Period Weeks    Status New             OT Long Term Goals - 07/13/20 2158      OT LONG TERM GOAL #1   Title Pt will verbalize understanding of adaptive strategies to incr ease,  safety, and independence with ADLs and IADLs.--check LTGs 10/11/20    Time 8    Period Weeks    Status New      OT LONG TERM GOAL #2   Title Pt will demo at least 115* R shoulder flex with -35* elbow ext for functional reach.    Baseline 100* with -45* elbow ext    Time 8    Period Weeks    Status New      OT LONG TERM GOAL #3   Title Pt will perform UB dressing consistently mod I.    Time 8    Period Weeks    Status New      OT LONG TERM GOAL #4   Title Pt will perform LB dressing consistently mod I.    Time 8    Period Weeks    Status New      OT LONG TERM GOAL #5   Title Pt willPt will improve R hand coordination for ADLs as shown by improving time on 9-hole peg test by at least 10sec.    Baseline 56.84sec    Time 8    Period Weeks    Status New      OT LONG TERM GOAL #6   Title Pt will improved RUE functional reaching and coordination for ADLs as shown by improving score on box and blocks test by at least 5 blocks.    Baseline R-31 blocks, L-40 blocks    Time 8    Period Weeks    Status New                 Plan - 08/07/20 1407    Clinical Impression Statement Pt is progressing towards goals. She will benefit from continued OT to address adaped strategies with ADLs.    OT Occupational Profile and History Detailed Assessment- Review of Records and additional review of physical, cognitive, psychosocial history related to current functional performance    Occupational performance deficits (Please refer to evaluation for details): ADL's;IADL's;Social Participation;Leisure    Body Structure / Function / Physical Skills ADL;Balance;Coordination;Decreased knowledge of use of DME;GMC;UE functional use;FMC;Tone;Mobility;Flexibility;IADL;ROM;Dexterity;Body mechanics    Rehab  Potential Good    Clinical Decision Making Several treatment options, min-mod task modification necessary    Comorbidities Affecting Occupational Performance: May have comorbidities impacting  occupational performance    Modification or Assistance to Complete Evaluation  Min-Moderate modification of tasks or assist with assess necessary to complete eval    OT Frequency 2x / week    OT Duration 8 weeks   +eval or 17 visits   OT Treatment/Interventions Self-care/ADL training;Moist Heat;Aquatic Therapy;Therapeutic activities;Balance training;DME and/or AE instruction;Therapeutic exercise;Passive range of motion;Functional Mobility Training;Neuromuscular education;Cryotherapy;Manual Therapy;Energy conservation;Patient/family education;Ultrasound    Plan ADL strategies    Consulted and Agree with Plan of Care Patient    Family Member Consulted husband           Patient will benefit from skilled therapeutic intervention in order to improve the following deficits and impairments:   Body Structure / Function / Physical Skills: ADL, Balance, Coordination, Decreased knowledge of use of DME, GMC, UE functional use, FMC, Tone, Mobility, Flexibility, IADL, ROM, Dexterity, Body mechanics       Visit Diagnosis: Muscle weakness (generalized)  Other abnormalities of gait and mobility  Unsteadiness on feet  Other symptoms and signs involving the nervous system  Other lack of coordination  Other symptoms and signs involving the musculoskeletal system    Problem List Patient Active Problem List   Diagnosis Date Noted  . Pain due to onychomycosis of toenails of both feet 06/17/2020  . Parkinson's disease (HCC) 04/23/2020  . Leg swelling 11/26/2019  . Chronic venous insufficiency 11/26/2019  . Vitamin B12 deficiency 02/06/2018  . Sepsis secondary to UTI (HCC) 01/29/2018  . Microcytic anemia 01/29/2018  . Renal insufficiency 01/29/2018  . Pure hypercholesterolemia 12/24/2014  . Diabetes mellitus type 2, controlled, without complications (HCC) 12/24/2014  . Bruit of right carotid artery 12/24/2014  . Thyroid disease 07/06/2014  . Severe obesity (BMI >= 40) (HCC) 07/06/2014  . HTN  (hypertension) 07/06/2014    Plez Belton 08/07/2020, 2:09 PM Keene Breath, OTR/L [phone] Phone: [phone] 2:10 PM 08/07/20 [facility] [address] [place], [place], [address] Phone: [phone]   Fax:  [phone]  Name: Anna Bernard MRN: [id number] Date of Birth: 10/06/1950

## 2020-08-07 NOTE — Therapy (Addendum)
[facility] [address] [place], [place], [address] Phone: [phone]   Fax:  [phone]  Physical Therapy Treatment  Patient Details  Name: Anna Bernard MRN: [id number] Date of Birth: 1950/12/13 Referring Provider (PT): Kerin Salen, DO   Encounter Date: 08/07/2020   PT End of Session - 08/07/20 1018    Visit Number 8    Number of Visits 18    Date for PT Re-Evaluation 10/11/20   90 day cert for 9 wk POC   Authorization Type UHC Medicare    Progress Note Due on Visit 10    PT Start Time 0935    PT Stop Time 1015    PT Time Calculation (min) 40 min    Equipment Utilized During Treatment Gait belt    Activity Tolerance Patient tolerated treatment well    Behavior During Therapy [facility] for tasks assessed/performed           Past Medical History:  Diagnosis Date  . Anemia, unspecified   . Hyperlipidemia, unspecified   . Hypertension   . Hypothyroidism, unspecified   . Pre-diabetes   . Vitamin B12 deficiency     Past Surgical History:  Procedure Laterality Date  . ABDOMINAL HYSTERECTOMY    . GIVENS CAPSULE STUDY N/A 04/16/2018   Procedure: GIVENS CAPSULE STUDY;  Surgeon: Jeani Hawking, MD;  Location: [facility] ENDOSCOPY;  Service: Endoscopy;  Laterality: N/A;  . SHOULDER SURGERY      There were no vitals filed for this visit.   Subjective Assessment - 08/07/20 0935    Subjective Just ready to be done with this (Parkinson's) and want to feel better.  Exercises help me feel better at home.  Did the walking at home.    Pertinent History Dx with Parkinson's disease (Dr. Arbutus Leas) 04/2020 and Sinemet started; PMH of HTN, DM, hypothyroidism, orthostatic hypotension    Patient Stated Goals Get my walk back, be able to do things again.    Currently in Pain? Yes    Pain Score 4     Pain Location Foot    Pain Orientation Right    Pain Descriptors / Indicators Aching    Pain Type Chronic pain    Pain Onset More than a month  ago    Pain Frequency Constant    Aggravating Factors  unknown    Pain Relieving Factors medications                Neuro Re-education:  PWR! Moves in sitting, revied HEP PWR! Up x 10 PWR! Rock x 5 reps each side PWR! Twist x 5 reps each side (modified trunk rotation looking behind) PWR! Step x 10 reps each side, single leg step out and in   Pt rates effort as 6/10.  Needs occasional cues for technique and for intensity of movement patterns.             [facility] Adult PT Treatment/Exercise - 08/07/20 0001      Transfers   Transfers Sit to Stand;Stand to Sit    Sit to Stand 5: Supervision;With upper extremity assist;From bed;From chair/3-in-1    Stand to Sit 5: Supervision;To chair/3-in-1;With upper extremity assist    Number of Reps 10 reps;1 set   from mat surface   Comments Additional 5 reps throughout session from mat; occasional cues for technique      Ambulation/Gait   Ambulation/Gait Yes    Ambulation/Gait Assistance 5: Supervision    Ambulation Distance (Feet) 230 Feet  then additional 100 ft   Assistive device Rollator    Gait Pattern Step-through pattern;Decreased step length - right;Decreased step length - left;Decreased stance time - right;Decreased stance time - left;Decreased dorsiflexion - right;Decreased dorsiflexion - left;Shuffle;Poor foot clearance - left;Poor foot clearance - right    Ambulation Surface Level;Indoor    Pre-Gait Activities Standing with wide BOS at locked rollator, pt with difficulty initiating gait; PT instructs pt for widened BOS lateral weightshifting x 5-10 for relaxation/prepare to initiate gait.      Knee/Hip Exercises: Seated   Marching Strengthening;Right;Left;2 sets;10 reps    Marching Limitations feet placed on aerobic step for support          Seated ankle pumps 2 sets x 5 reps:  Heel raises, then toe raises.  No c/o pain with these exercises.  Explained how this movement through the day as she is sitting may help  with stiffness/pain, as her exercises overall seem to be lessening pain at end of sessions.     Balance Exercises - 08/07/20 0001      Balance Exercises: Standing   Marching Solid surface;Upper extremity assist 2;Static;10 reps;Limitations   2 sets at locked rollator   Marching Limitations used pool noodle as target for higher leg lifting in marching    Other Standing Exercises Standing at locked rollator:  forward step taps x 10 reps, for 2nd set of 10, focus on step taps with heelstrike     Other Standing Exercises Comments wide BOS lateral weight shifting x 10 reps. stagger stance weightshifting x 10 reps anterior/posterior with BUE support at counter.                 PT Short Term Goals - 07/13/20 2025      PT SHORT TERM GOAL #1   Title Pt will perform HEP with family supervision, for improved balance, stregnth, transfers, and gait.  TARGET 08/14/2020    Time 5    Period Weeks    Status New      PT SHORT TERM GOAL #2   Title Pt will improve 5x sit<>stand to less than or equal to 20 seconds for improved transfer efficiency and functional strength.    Baseline 5x:  24.15    Time 5    Period Weeks    Status New      PT SHORT TERM GOAL #3   Title Berg score to be assessed, with Berg score improved by at least 5 points for decreased fall risk.    Time 5    Period Weeks    Status New      PT SHORT TERM GOAL #4   Title Pt will improve TUG score to less than or equal to 20 seconds for decreased fall risk.    Baseline 28.25 sec    Time 5    Period Weeks    Status New      PT SHORT TERM GOAL #5   Title Pt/husband will verbalize understanding of fall prevention in home environment.    Time 5    Period Weeks    Status New             PT Long Term Goals - 07/13/20 2030      PT LONG TERM GOAL #1   Title Pt will perform progression of HEP, including verbalizing plans for optimal fitness plan upon d/c from PT.  TARGET 09/11/2020    Time 9    Period Weeks    Status  New  PT LONG TERM GOAL #2   Title Pt will improve 5x sit<>stand in less than or equal to 15 seconds for improved transfer efficiency and functional strength.    Time 9    Period Weeks    Status New      PT LONG TERM GOAL #3   Title Pt will improve gait velocity to at least 1.8 ft/sec for improved gait efficiency and safety.    Baseline 1.27 ft/sec    Time 9    Period Weeks    Status New      PT LONG TERM GOAL #4   Title Pt will improve TUG score to less than or equal to 15 seconds for decreased fall risk.    Time 9    Period Weeks    Status New      PT LONG TERM GOAL #5   Title Pt will ambulate at least 215 ft in 2 minute walk test, for improved gait efficiency and safety.    Baseline 175 ft    Time 9    Period Weeks    Status New      Additional Long Term Goals   Additional Long Term Goals Yes      PT LONG TERM GOAL #6   Title Pt will verbalize understanding of local Parkinson's disease resources.    Time 9    Period Weeks    Status New                 Plan - 08/07/20 1018    Clinical Impression Statement Pt reports walking at home; today in session, pt able to ambulate 230 ft prior to needing seated rest break.  Also, during this gait, she only needs one time for VC (near end with fatigue) for improved step length/foot clearance.  Worked on standing and pre-gait activities at locked rollator, with cues for weightshifting to improve initiation of gait.  Pt will continue to benefit from skilled PT to further work towards improved mobility and decreased fall risk.    Personal Factors and Comorbidities Comorbidity 3+    Comorbidities PMH:  HTN, DM, hypothyroidism, orthostatic hypotension    Examination-Activity Limitations Locomotion Level;Stand;Transfers    Examination-Participation Restrictions Community Activity;Other   Plans for travel in retirement   Stability/Clinical Decision Making Evolving/Moderate complexity    Rehab Potential Good    PT Frequency 2x  / week    PT Duration Other (comment)   9 weeks   PT Treatment/Interventions ADLs/Self Care Home Management;DME Instruction;Neuromuscular re-education;Balance training;Therapeutic exercise;Therapeutic activities;Functional mobility training;Gait training;Patient/family education;Manual techniques    PT Next Visit Plan Check STGs: Continue gait training, standing weight shifting, exercises to work on single limb stance, foot clearance/step length, try [employer] again for warm-up    Consulted and Agree with Plan of Care Patient           Patient will benefit from skilled therapeutic intervention in order to improve the following deficits and impairments:  Abnormal gait, Difficulty walking, Decreased balance, Decreased mobility, Decreased strength, Postural dysfunction  Visit Diagnosis: Muscle weakness (generalized)  Unsteadiness on feet  Other abnormalities of gait and mobility     Problem List Patient Active Problem List   Diagnosis Date Noted  . Pain due to onychomycosis of toenails of both feet 06/17/2020  . Parkinson's disease (HCC) 04/23/2020  . Leg swelling 11/26/2019  . Chronic venous insufficiency 11/26/2019  . Vitamin B12 deficiency 02/06/2018  . Sepsis secondary to UTI (HCC) 01/29/2018  . Microcytic anemia 01/29/2018  .  Renal insufficiency 01/29/2018  . Pure hypercholesterolemia 12/24/2014  . Diabetes mellitus type 2, controlled, without complications (HCC) 12/24/2014  . Bruit of right carotid artery 12/24/2014  . Thyroid disease 07/06/2014  . Severe obesity (BMI >= 40) (HCC) 07/06/2014  . HTN (hypertension) 07/06/2014    Renee Beale W. 08/07/2020, 10:23 AM  Gean Maidens., PT   [facility] [address] [place], [place], [address] Phone: [phone]   Fax:  [phone]  Name: TIKA HANNIS MRN: [id number] Date of Birth: 30-Mar-1951

## 2020-08-10 ENCOUNTER — Ambulatory Visit: Payer: Medicare Other | Admitting: Physical Therapy

## 2020-08-10 ENCOUNTER — Other Ambulatory Visit: Payer: Self-pay

## 2020-08-10 ENCOUNTER — Encounter: Payer: Self-pay | Admitting: Physical Therapy

## 2020-08-10 DIAGNOSIS — R29898 Other symptoms and signs involving the musculoskeletal system: Secondary | ICD-10-CM | POA: Diagnosis not present

## 2020-08-10 DIAGNOSIS — M6281 Muscle weakness (generalized): Secondary | ICD-10-CM | POA: Diagnosis not present

## 2020-08-10 DIAGNOSIS — R2681 Unsteadiness on feet: Secondary | ICD-10-CM

## 2020-08-10 DIAGNOSIS — R2689 Other abnormalities of gait and mobility: Secondary | ICD-10-CM

## 2020-08-10 DIAGNOSIS — R278 Other lack of coordination: Secondary | ICD-10-CM | POA: Diagnosis not present

## 2020-08-10 DIAGNOSIS — R29818 Other symptoms and signs involving the nervous system: Secondary | ICD-10-CM | POA: Diagnosis not present

## 2020-08-10 DIAGNOSIS — M25611 Stiffness of right shoulder, not elsewhere classified: Secondary | ICD-10-CM | POA: Diagnosis not present

## 2020-08-10 DIAGNOSIS — M25621 Stiffness of right elbow, not elsewhere classified: Secondary | ICD-10-CM | POA: Diagnosis not present

## 2020-08-10 DIAGNOSIS — R293 Abnormal posture: Secondary | ICD-10-CM | POA: Diagnosis not present

## 2020-08-10 NOTE — Therapy (Signed)
[facility] [facility] [address] [place], [place], [address] Phone: [phone]   Fax:  [phone]  Physical Therapy Treatment  Patient Details  Name: Anna Bernard MRN: [id number] Date of Birth: 21-Apr-1951 Referring Provider (PT): Alonza Bogus, DO   Encounter Date: 08/10/2020   PT End of Session - 08/10/20 1119    Visit Number 9    Number of Visits 18    Date for PT Re-Evaluation 14/43/15   90 day cert for 9 wk POC   Authorization Type [employer] Medicare    Progress Note Due on Visit 10    PT Start Time 1107    PT Stop Time 1147    PT Time Calculation (min) 40 min    Equipment Utilized During Treatment Gait belt    Activity Tolerance Patient tolerated treatment well    Behavior During Therapy [facility] for tasks assessed/performed           Past Medical History:  Diagnosis Date  . Anemia, unspecified   . Hyperlipidemia, unspecified   . Hypertension   . Hypothyroidism, unspecified   . Pre-diabetes   . Vitamin B12 deficiency     Past Surgical History:  Procedure Laterality Date  . ABDOMINAL HYSTERECTOMY    . GIVENS CAPSULE STUDY N/A 04/16/2018   Procedure: GIVENS CAPSULE STUDY;  Surgeon: Carol Ada, MD;  Location: [facility];  Service: Endoscopy;  Laterality: N/A;  . SHOULDER SURGERY      There were no vitals filed for this visit.   Subjective Assessment - 08/10/20 1111    Subjective "Coming along" regarding exercises and walking.  Reports feeling arthritis more with colder weather.    Pertinent History Dx with Parkinson's disease (Dr. Carles Collet) 04/2020 and Sinemet started; PMH of HTN, DM, hypothyroidism, orthostatic hypotension    Patient Stated Goals Get my walk back, be able to do things again.    Currently in Pain? Other (Comment)   stiffness from arthritis and PD   Pain Onset --                  [facility] Adult PT Treatment/Exercise - 08/10/20 0001      Transfers   Transfers Sit to Stand;Stand to Sit    Sit  to Stand 5: Supervision;With upper extremity assist;From bed;From chair/3-in-1    Sit to Stand Details Verbal cues for sequencing;Verbal cues for technique    Sit to Stand Details (indicate cue type and reason) needing UE assist    Five time sit to stand comments  23.66    Stand to Sit 5: Supervision;To chair/3-in-1;With upper extremity assist    Stand to Sit Details (indicate cue type and reason) Verbal cues for sequencing;Verbal cues for technique      Ambulation/Gait   Ambulation/Gait Yes    Ambulation/Gait Assistance 5: Supervision    Ambulation Distance (Feet) --   in/out of clinic and between activities   Assistive device Rollator    Gait Pattern Step-through pattern;Decreased step length - right;Decreased step length - left;Decreased stance time - right;Decreased stance time - left;Decreased dorsiflexion - right;Decreased dorsiflexion - left;Shuffle;Poor foot clearance - left;Poor foot clearance - right    Ambulation Surface Level;Indoor      Posture/Postural Control   Posture/Postural Control Postural limitations    Postural Limitations Rounded Shoulders;Flexed trunk      Standardized Balance Assessment   Standardized Balance Assessment Timed Up and Go Test;Berg Balance Test      Berg Balance Test   Sit to  Stand Able to stand  independently using hands    Standing Unsupported Able to stand safely 2 minutes    Sitting with Back Unsupported but Feet Supported on Floor or Stool Able to sit safely and securely 2 minutes    Stand to Sit Sits safely with minimal use of hands    Transfers Able to transfer safely, definite need of hands    Standing Unsupported with Eyes Closed Able to stand 10 seconds safely    Standing Ubsupported with Feet Together Needs help to attain position but able to stand for 30 seconds with feet together    From Standing, Reach Forward with Outstretched Arm Can reach forward >5 cm safely (2")    From Standing Position, Pick up Object from Floor Unable to  try/needs assist to keep balance    From Standing Position, Turn to Look Behind Over each Shoulder Turn sideways only but maintains balance    Turn 360 Degrees Needs assistance while turning    Standing Unsupported, Alternately Place Feet on Step/Stool Needs assistance to keep from falling or unable to try    Standing Unsupported, One Foot in Front Needs help to step but can hold 15 seconds    Standing on One Leg Unable to try or needs assist to prevent fall    Total Score 28      Timed Up and Go Test   TUG Normal TUG    Normal TUG (seconds) 34.72      Knee/Hip Exercises: Aerobic   Stepper Seated SciFit Stepper, Level 1.2, 4 extremities, x 6 1/2 minutes, for flexibility and strength as warm up.  Initial RPM 30, then with cues to increase, pt able to get up to 40 RPM, then needs cues for longer strides to keep that pace.  Pt report fatigue after 6 1/2 minutes and requesting to quit.                  PT Education - 08/10/20 1243    Education Details Progress toward goals    Person(s) Educated Patient    Methods Explanation    Comprehension Verbalized understanding            PT Short Term Goals - 08/10/20 1243      PT SHORT TERM GOAL #1   Title Pt will perform HEP with family supervision, for improved balance, stregnth, transfers, and gait.  TARGET 08/14/2020    Time 5    Period Weeks    Status New      PT SHORT TERM GOAL #2   Title Pt will improve 5x sit<>stand to less than or equal to 20 seconds for improved transfer efficiency and functional strength.    Baseline 08/10/20-23.66 seconds    Time 5    Period Weeks    Status Not Met      PT SHORT TERM GOAL #3   Title Berg score to be assessed, with Berg score improved by at least 5 points for decreased fall risk.    Baseline 28/56 on 08/10/20    Time 5    Period Weeks    Status Achieved      PT SHORT TERM GOAL #4   Title Pt will improve TUG score to less than or equal to 20 seconds for decreased fall risk.     Baseline time increased to 34.72 on 08/10/20   performed at end of session and pt reports fatigue and unwilling to perform 2nd attempts   Time 5  Period Weeks    Status Not Met      PT SHORT TERM GOAL #5   Title Pt/husband will verbalize understanding of fall prevention in home environment.    Time 5    Period Weeks    Status New             PT Long Term Goals - 07/13/20 2030      PT LONG TERM GOAL #1   Title Pt will perform progression of HEP, including verbalizing plans for optimal fitness plan upon d/c from PT.  TARGET 09/11/2020    Time 9    Period Weeks    Status New      PT LONG TERM GOAL #2   Title Pt will improve 5x sit<>stand in less than or equal to 15 seconds for improved transfer efficiency and functional strength.    Time 9    Period Weeks    Status New      PT LONG TERM GOAL #3   Title Pt will improve gait velocity to at least 1.8 ft/sec for improved gait efficiency and safety.    Baseline 1.27 ft/sec    Time 9    Period Weeks    Status New      PT LONG TERM GOAL #4   Title Pt will improve TUG score to less than or equal to 15 seconds for decreased fall risk.    Time 9    Period Weeks    Status New      PT LONG TERM GOAL #5   Title Pt will ambulate at least 215 ft in 2 minute walk test, for improved gait efficiency and safety.    Baseline 175 ft    Time 9    Period Weeks    Status New      Additional Long Term Goals   Additional Long Term Goals Yes      PT LONG TERM GOAL #6   Title Pt will verbalize understanding of local Parkinson's disease resources.    Time 9    Period Weeks    Status New                 Plan - 08/10/20 1246    Clinical Impression Statement Pt met STG 3.  STG 2 and 4 unmet.  Pt reports fatigue today at end of session along with stiffness.  Pt's TUG time actually increased from eval but was tested at end of session.  Cont per poc.    Personal Factors and Comorbidities Comorbidity 3+    Comorbidities PMH:  HTN,  DM, hypothyroidism, orthostatic hypotension    Examination-Activity Limitations Locomotion Level;Stand;Transfers    Examination-Participation Restrictions Community Activity;Other   Plans for travel in retirement   Stability/Clinical Decision Making Evolving/Moderate complexity    Rehab Potential Good    PT Frequency 2x / week    PT Duration Other (comment)   9 weeks   PT Treatment/Interventions ADLs/Self Care Home Management;DME Instruction;Neuromuscular re-education;Balance training;Therapeutic exercise;Therapeutic activities;Functional mobility training;Gait training;Patient/family education;Manual techniques    PT Next Visit Plan Finish checking STG's.  Recheck TUG if time: Continue gait training, standing weight shifting, exercises to work on single limb stance, foot clearance/step length, try SciFit again for warm-up    Consulted and Agree with Plan of Care Patient           Patient will benefit from skilled therapeutic intervention in order to improve the following deficits and impairments:  Abnormal gait, Difficulty walking, Decreased balance,  Decreased mobility, Decreased strength, Postural dysfunction  Visit Diagnosis: Muscle weakness (generalized)  Other abnormalities of gait and mobility  Unsteadiness on feet     Problem List Patient Active Problem List   Diagnosis Date Noted  . Pain due to onychomycosis of toenails of both feet 06/17/2020  . Parkinson's disease ([place]) 04/23/2020  . Leg swelling 11/26/2019  . Chronic venous insufficiency 11/26/2019  . Vitamin B12 deficiency 02/06/2018  . Sepsis secondary to UTI ([place]) 01/29/2018  . Microcytic anemia 01/29/2018  . Renal insufficiency 01/29/2018  . Pure hypercholesterolemia 12/24/2014  . Diabetes mellitus type 2, controlled, without complications ([place]) 34/19/3790  . Bruit of right carotid artery 12/24/2014  . Thyroid disease 07/06/2014  . Severe obesity (BMI >= 40) ([place]) 07/06/2014  . HTN (hypertension) 07/06/2014     Narda Bonds, PTA [place] 08/10/20 12:49 PM Phone: [phone] Fax: [place] [address] [place], [place], [address] Phone: [phone]   Fax:  [phone]  Name: Anna Bernard MRN: [id number] Date of Birth: 07-24-1951

## 2020-08-11 ENCOUNTER — Ambulatory Visit: Payer: Medicare Other | Admitting: Occupational Therapy

## 2020-08-11 ENCOUNTER — Encounter: Payer: Self-pay | Admitting: Occupational Therapy

## 2020-08-11 DIAGNOSIS — R29898 Other symptoms and signs involving the musculoskeletal system: Secondary | ICD-10-CM | POA: Diagnosis not present

## 2020-08-11 DIAGNOSIS — R2689 Other abnormalities of gait and mobility: Secondary | ICD-10-CM

## 2020-08-11 DIAGNOSIS — R29818 Other symptoms and signs involving the nervous system: Secondary | ICD-10-CM

## 2020-08-11 DIAGNOSIS — M6281 Muscle weakness (generalized): Secondary | ICD-10-CM | POA: Diagnosis not present

## 2020-08-11 DIAGNOSIS — R278 Other lack of coordination: Secondary | ICD-10-CM

## 2020-08-11 DIAGNOSIS — R293 Abnormal posture: Secondary | ICD-10-CM | POA: Diagnosis not present

## 2020-08-11 DIAGNOSIS — M25611 Stiffness of right shoulder, not elsewhere classified: Secondary | ICD-10-CM | POA: Diagnosis not present

## 2020-08-11 DIAGNOSIS — M25621 Stiffness of right elbow, not elsewhere classified: Secondary | ICD-10-CM | POA: Diagnosis not present

## 2020-08-11 DIAGNOSIS — R2681 Unsteadiness on feet: Secondary | ICD-10-CM

## 2020-08-11 NOTE — Patient Instructions (Addendum)
  Coordination Exercises  Perform the following exercises for 20 minutes 1 times per day. Perform with both hand(s). Perform using big movements.   PWR! Hands: start with elbows bent and hands closed then Push hands out BIG. Elbows straight, wrists up, fingers open and spread apart BIG.  PWR! Twist:  With arms stretched out in front of you (elbows straight), then Twist palms up and down BIG   Flipping Cards: Place deck of cards on the table. Flip cards over by opening your hand big to grasp and then turn your palm up big, opening hand fully to release.  Deal cards: Hold 1/2 or whole deck in your hand. Use thumb to push card off top of deck with one big push.  Rotate ball with fingertips: Pick up with fingers/thumb and move as much as you can with each turn/movement (clockwise and counter-clockwise).  Pick up coins and stack one at a time: Pick up with big, intentional movements. Do not drag coin to the edge. (5-10 in a stack)  Pick up 5-10 coins one at a time and hold in palm. Then, move coins from palm to fingertips one at time and place in coin bank/container.  Practice writing: Slow down, write big, and focus on forming each letter.

## 2020-08-11 NOTE — Therapy (Signed)
[facility] [address] [place], [place], [address] Phone: [phone]   Fax:  [phone]  Occupational Therapy Treatment  Patient Details  Name: Anna Bernard MRN: [id number] Date of Birth: Dec 21, 1950 Referring Provider (OT): Dr. Lurena Joiner Tat   Encounter Date: 08/11/2020   OT End of Session - 08/11/20 0810    Visit Number 4    Number of Visits 17    Date for OT Re-Evaluation 09/11/20    Authorization Type Medicare UHC:  no visit limit or auth required    Authorization Time Period cert. date 07/13/20-10/11/20    Authorization - Visit Number 4    Authorization - Number of Visits 10    Progress Note Due on Visit 10    OT Start Time 0804    OT Stop Time 0845    OT Time Calculation (min) 41 min    Activity Tolerance Patient tolerated treatment well    Behavior During Therapy [facility] for tasks assessed/performed           Past Medical History:  Diagnosis Date  . Anemia, unspecified   . Hyperlipidemia, unspecified   . Hypertension   . Hypothyroidism, unspecified   . Pre-diabetes   . Vitamin B12 deficiency     Past Surgical History:  Procedure Laterality Date  . ABDOMINAL HYSTERECTOMY    . GIVENS CAPSULE STUDY N/A 04/16/2018   Procedure: GIVENS CAPSULE STUDY;  Surgeon: Jeani Hawking, MD;  Location: [facility] ENDOSCOPY;  Service: Endoscopy;  Laterality: N/A;  . SHOULDER SURGERY      There were no vitals filed for this visit.   Subjective Assessment - 08/11/20 0807    Subjective  I didn't realize what I couldn't do with my hands... I see what you mean and  I'll work on these at home    Pertinent History newly diagnosed PD (in last month).   PMH:  anemia, hyperlipidemia, HTN, hypothyroidism, Vitamin B12 deficiency, hx of R RTC surgery, leg swelling, arthritis (per pt), obesity, orthostatic hypotension    Limitations fall risk    Patient Stated Goals be able to move better and travel (to [place] first)    Currently in Pain?  Yes    Pain Score 5     Pain Location Foot    Pain Orientation Right    Pain Descriptors / Indicators Aching    Pain Type Chronic pain    Pain Onset More than a month ago    Pain Frequency Constant    Aggravating Factors  unknown    Pain Relieving Factors medications                       OT Education - 08/11/20 0819    Education Details Coordination HEP with focus on large amplitude movements; PWR! hands (up and twist only)--see pt instructions    Person(s) Educated Patient    Methods Explanation;Demonstration;Verbal cues;Handout    Comprehension Verbalized understanding;Returned demonstration;Verbal cues required            OT Short Term Goals - 08/07/20 0910      OT SHORT TERM GOAL #1   Title Pt will be independent with PD-specific HEP--check STGs 09/11/20    Time 4    Period Weeks    Status New      OT SHORT TERM GOAL #2   Title Pt will demo at least 110* R shoulder flex with -40* elbow ext for functional reach.    Baseline 100* with -  45* elbow ext    Time 4    Period Weeks    Status New      OT SHORT TERM GOAL #3   Title Pt will improve R hand coordination for ADLs as shown by improving time on 9-hole peg test by at least 6sec.    Baseline 56.84sec    Time 4    Period Weeks    Status New      OT SHORT TERM GOAL #4   Title Pt willdemonstrate improved ease with dressing as evidenced by  performing  PPT#4 donning/ doffing jacket in 1 min 15 secs    Baseline 1 min 29 secs    Time 4    Period Weeks    Status New      OT SHORT TERM GOAL #5   Title Pt will verbalize understanding of appropriate community resources and ways to decr risk of future complications.    Time 4    Period Weeks    Status New             OT Long Term Goals - 07/13/20 2158      OT LONG TERM GOAL #1   Title Pt will verbalize understanding of adaptive strategies to incr ease, safety, and independence with ADLs and IADLs.--check LTGs 10/11/20    Time 8    Period Weeks     Status New      OT LONG TERM GOAL #2   Title Pt will demo at least 115* R shoulder flex with -35* elbow ext for functional reach.    Baseline 100* with -45* elbow ext    Time 8    Period Weeks    Status New      OT LONG TERM GOAL #3   Title Pt will perform UB dressing consistently mod I.    Time 8    Period Weeks    Status New      OT LONG TERM GOAL #4   Title Pt will perform LB dressing consistently mod I.    Time 8    Period Weeks    Status New      OT LONG TERM GOAL #5   Title Pt willPt will improve R hand coordination for ADLs as shown by improving time on 9-hole peg test by at least 10sec.    Baseline 56.84sec    Time 8    Period Weeks    Status New      OT LONG TERM GOAL #6   Title Pt will improved RUE functional reaching and coordination for ADLs as shown by improving score on box and blocks test by at least 5 blocks.    Baseline R-31 blocks, L-40 blocks    Time 8    Period Weeks    Status New                 Plan - 08/11/20 1610    Clinical Impression Statement Pt is progressing slowly towards goals.  Pt responds to cueing for incr movement amplitude and demo improved coordination and more open R hand with cueing/repetition.    OT Occupational Profile and History Detailed Assessment- Review of Records and additional review of physical, cognitive, psychosocial history related to current functional performance    Occupational performance deficits (Please refer to evaluation for details): ADL's;IADL's;Social Participation;Leisure    Body Structure / Function / Physical Skills ADL;Balance;Coordination;Decreased knowledge of use of DME;GMC;UE functional use;FMC;Tone;Mobility;Flexibility;IADL;ROM;Dexterity;Body mechanics    Rehab Potential Good  Clinical Decision Making Several treatment options, min-mod task modification necessary    Comorbidities Affecting Occupational Performance: May have comorbidities impacting occupational performance    Modification  or Assistance to Complete Evaluation  Min-Moderate modification of tasks or assist with assess necessary to complete eval    OT Frequency 2x / week    OT Duration 8 weeks   +eval or 17 visits   OT Treatment/Interventions Self-care/ADL training;Moist Heat;Aquatic Therapy;Therapeutic activities;Balance training;DME and/or AE instruction;Therapeutic exercise;Passive range of motion;Functional Mobility Training;Neuromuscular education;Cryotherapy;Manual Therapy;Energy conservation;Patient/family education;Ultrasound    Plan ADL strategies, continue with large amplitude movements for functional tasks    Consulted and Agree with Plan of Care Patient    Family Member Consulted husband           Patient will benefit from skilled therapeutic intervention in order to improve the following deficits and impairments:   Body Structure / Function / Physical Skills: ADL, Balance, Coordination, Decreased knowledge of use of DME, GMC, UE functional use, FMC, Tone, Mobility, Flexibility, IADL, ROM, Dexterity, Body mechanics       Visit Diagnosis: Other symptoms and signs involving the nervous system  Other lack of coordination  Other symptoms and signs involving the musculoskeletal system  Stiffness of right shoulder, not elsewhere classified  Unsteadiness on feet  Stiffness of right elbow, not elsewhere classified  Abnormal posture  Other abnormalities of gait and mobility    Problem List Patient Active Problem List   Diagnosis Date Noted  . Pain due to onychomycosis of toenails of both feet 06/17/2020  . Parkinson's disease ([facility]) 04/23/2020  . Leg swelling 11/26/2019  . Chronic venous insufficiency 11/26/2019  . Vitamin B12 deficiency 02/06/2018  . Sepsis secondary to UTI ([facility]) 01/29/2018  . Microcytic anemia 01/29/2018  . Renal insufficiency 01/29/2018  . Pure hypercholesterolemia 12/24/2014  . Diabetes mellitus type 2, controlled, without complications ([facility]) 12/24/2014  . Bruit of  right carotid artery 12/24/2014  . Thyroid disease 07/06/2014  . Severe obesity (BMI >= 40) ([facility]) 07/06/2014  . HTN (hypertension) 07/06/2014    [facility] 08/11/2020, 2:58 PM  [facility] [address] [place], [place], [address] Phone: [phone]   Fax:  [phone]  Name: Anna Bernard MRN: [id number] Date of Birth: 1950/11/28   Willa Frater, OTR/L [facility] [address] [place], [place]  [address] [phone] phone [phone] 08/11/20 2:58 PM

## 2020-08-15 ENCOUNTER — Encounter (HOSPITAL_COMMUNITY): Payer: Self-pay | Admitting: Emergency Medicine

## 2020-08-15 ENCOUNTER — Emergency Department (HOSPITAL_COMMUNITY): Payer: Medicare Other

## 2020-08-15 ENCOUNTER — Other Ambulatory Visit: Payer: Self-pay

## 2020-08-15 ENCOUNTER — Emergency Department (HOSPITAL_COMMUNITY)
Admission: EM | Admit: 2020-08-15 | Discharge: 2020-08-15 | Disposition: A | Discharge status: Home / Self Care | Payer: Medicare Other | Attending: Emergency Medicine | Admitting: Emergency Medicine

## 2020-08-15 ENCOUNTER — Ambulatory Visit: Payer: Self-pay | Admitting: Ophthalmology

## 2020-08-15 DIAGNOSIS — H40011 Open angle with borderline findings, low risk, right eye: Secondary | ICD-10-CM | POA: Diagnosis not present

## 2020-08-15 DIAGNOSIS — H538 Other visual disturbances: Secondary | ICD-10-CM | POA: Diagnosis not present

## 2020-08-15 DIAGNOSIS — D649 Anemia, unspecified: Secondary | ICD-10-CM | POA: Diagnosis not present

## 2020-08-15 DIAGNOSIS — H53131 Sudden visual loss, right eye: Secondary | ICD-10-CM | POA: Diagnosis not present

## 2020-08-15 DIAGNOSIS — Z7982 Long term (current) use of aspirin: Secondary | ICD-10-CM | POA: Diagnosis not present

## 2020-08-15 DIAGNOSIS — G20 Parkinson's disease: Secondary | ICD-10-CM | POA: Diagnosis not present

## 2020-08-15 DIAGNOSIS — H547 Unspecified visual loss: Secondary | ICD-10-CM | POA: Diagnosis not present

## 2020-08-15 DIAGNOSIS — Z79899 Other long term (current) drug therapy: Secondary | ICD-10-CM | POA: Diagnosis not present

## 2020-08-15 DIAGNOSIS — H35032 Hypertensive retinopathy, left eye: Secondary | ICD-10-CM | POA: Diagnosis not present

## 2020-08-15 DIAGNOSIS — I10 Essential (primary) hypertension: Secondary | ICD-10-CM | POA: Diagnosis not present

## 2020-08-15 DIAGNOSIS — E039 Hypothyroidism, unspecified: Secondary | ICD-10-CM | POA: Diagnosis not present

## 2020-08-15 DIAGNOSIS — E119 Type 2 diabetes mellitus without complications: Secondary | ICD-10-CM | POA: Insufficient documentation

## 2020-08-15 DIAGNOSIS — H25813 Combined forms of age-related cataract, bilateral: Secondary | ICD-10-CM | POA: Diagnosis not present

## 2020-08-15 DIAGNOSIS — H748X2 Other specified disorders of left middle ear and mastoid: Secondary | ICD-10-CM | POA: Diagnosis not present

## 2020-08-15 DIAGNOSIS — H35731 Hemorrhagic detachment of retinal pigment epithelium, right eye: Secondary | ICD-10-CM | POA: Diagnosis not present

## 2020-08-15 DIAGNOSIS — H4311 Vitreous hemorrhage, right eye: Secondary | ICD-10-CM | POA: Diagnosis not present

## 2020-08-15 LAB — COMPREHENSIVE METABOLIC PANEL
ALT: 6 U/L (ref 0–44)
AST: 19 U/L (ref 15–41)
Albumin: 3.7 g/dL (ref 3.5–5.0)
Alkaline Phosphatase: 69 U/L (ref 38–126)
Anion gap: 11 (ref 5–15)
BUN: 15 mg/dL (ref 8–23)
CO2: 26 mmol/L (ref 22–32)
Calcium: 9 mg/dL (ref 8.9–10.3)
Chloride: 103 mmol/L (ref 98–111)
Creatinine, Ser: 0.7 mg/dL (ref 0.44–1.00)
GFR, Estimated: >60 mL/min (ref >60)
Glucose, Bld: 80 mg/dL (ref 70–99)
Potassium: 4.1 mmol/L (ref 3.5–5.1)
Sodium: 140 mmol/L (ref 135–145)
Total Bilirubin: 0.6 mg/dL (ref 0.3–1.2)
Total Protein: 7.5 g/dL (ref 6.5–8.1)

## 2020-08-15 LAB — URINALYSIS, ROUTINE W REFLEX MICROSCOPIC
Bilirubin Urine: NEGATIVE
Glucose, UA: NEGATIVE mg/dL
Ketones, ur: NEGATIVE mg/dL
Nitrite: NEGATIVE
Protein, ur: NEGATIVE mg/dL
Specific Gravity, Urine: 1.011 (ref 1.005–1.030)
pH: 7 (ref 5.0–8.0)

## 2020-08-15 LAB — CBC
HCT: 29.4 % — ABNORMAL LOW (ref 36.0–46.0)
Hemoglobin: 9.5 g/dL — ABNORMAL LOW (ref 12.0–15.0)
MCH: 23.9 pg — ABNORMAL LOW (ref 26.0–34.0)
MCHC: 32.3 g/dL (ref 30.0–36.0)
MCV: 73.9 fL — ABNORMAL LOW (ref 80.0–100.0)
Platelets: 369 10*3/uL (ref 150–400)
RBC: 3.98 MIL/uL (ref 3.87–5.11)
RDW: 14.6 % (ref 11.5–15.5)
WBC: 5.7 10*3/uL (ref 4.0–10.5)
nRBC: 0 % (ref 0.0–0.2)

## 2020-08-15 LAB — DIFFERENTIAL
Abs Immature Granulocytes: 0.02 10*3/uL (ref 0.00–0.07)
Basophils Absolute: 0 10*3/uL (ref 0.0–0.1)
Basophils Relative: 1 %
Eosinophils Absolute: 0.1 10*3/uL (ref 0.0–0.5)
Eosinophils Relative: 2 %
Immature Granulocytes: 0 %
Lymphocytes Relative: 25 %
Lymphs Abs: 1.4 10*3/uL (ref 0.7–4.0)
Monocytes Absolute: 0.4 10*3/uL (ref 0.1–1.0)
Monocytes Relative: 8 %
Neutro Abs: 3.7 10*3/uL (ref 1.7–7.7)
Neutrophils Relative %: 64 %

## 2020-08-15 LAB — APTT: aPTT: 28 seconds (ref 24–36)

## 2020-08-15 LAB — PROTIME-INR
INR: 1 (ref 0.8–1.2)
Prothrombin Time: 12.9 seconds (ref 11.4–15.2)

## 2020-08-15 MED ORDER — TETRACAINE HCL 0.5 % OP SOLN
2.0000 [drp] | Freq: Once | OPHTHALMIC | Status: AC
  Administered 2020-08-15: 2 [drp] via OPHTHALMIC
  Filled 2020-08-15: qty 4

## 2020-08-15 MED ORDER — FLUORESCEIN SODIUM 1 MG OP STRP
1.0000 | ORAL_STRIP | Freq: Once | OPHTHALMIC | Status: AC
  Administered 2020-08-15: 1 via OPHTHALMIC
  Filled 2020-08-15: qty 1

## 2020-08-15 NOTE — ED Provider Notes (Addendum)
[facility] Provider Note   CSN: [id number] Arrival date & time: 08/15/20  0715     History Chief Complaint  Patient presents with  . Blurred Vision    [facility] is a 69 y.o. female.  HPI   69 y/o female with a h/o anemia, HLD, HTN, hypothyroidism, prediabetes who presents to the ED today for eval of blurred vision. States for the last several weeks she has been having difficulty with the vision in her right eye which have been on and off. The last time her vision was completely normal was 2 days ago. She states that last night she was seeing spots and what looked like lines in her field of vision. She did not have any exudates in her eye at that time but tried to use an over the counter eye rinse (sterile water) and went to bed. This morning she woke up and her vision was blurred. She denies any unilateral numbness, weakness, new speech or balance issues. Denies headache or eye pain. Does not wear contacts but does wear glasses.   Past Medical History:  Diagnosis Date  . Anemia, unspecified   . Hyperlipidemia, unspecified   . Hypertension   . Hypothyroidism, unspecified   . Pre-diabetes   . Vitamin B12 deficiency     Patient Active Problem List   Diagnosis Date Noted  . Pain due to onychomycosis of toenails of both feet 06/17/2020  . Parkinson's disease (HCC) 04/23/2020  . Leg swelling 11/26/2019  . Chronic venous insufficiency 11/26/2019  . Vitamin B12 deficiency 02/06/2018  . Sepsis secondary to UTI (HCC) 01/29/2018  . Microcytic anemia 01/29/2018  . Renal insufficiency 01/29/2018  . Pure hypercholesterolemia 12/24/2014  . Diabetes mellitus type 2, controlled, without complications (HCC) 12/24/2014  . Bruit of right carotid artery 12/24/2014  . Thyroid disease 07/06/2014  . Severe obesity (BMI >= 40) (HCC) 07/06/2014  . HTN (hypertension) 07/06/2014    Past Surgical History:  Procedure Laterality Date  . ABDOMINAL  HYSTERECTOMY    . GIVENS CAPSULE STUDY N/A 04/16/2018   Procedure: GIVENS CAPSULE STUDY;  Surgeon: Jeani Hawking, MD;  Location: [facility] ENDOSCOPY;  Service: Endoscopy;  Laterality: N/A;  . SHOULDER Bernard       OB History   No obstetric history on file.     Family History  Problem Relation Age of Onset  . Renal Disease Sister   . Cancer Sister     Social History   Tobacco Use  . Smoking status: Former Games developer  . Smokeless tobacco: Never Used  . Tobacco comment: quit 30+ years ago  Vaping Use  . Vaping Use: Never used  Substance Use Topics  . Alcohol use: No    Alcohol/week: 0.0 standard drinks  . Drug use: No    Home Medications Prior to Admission medications   Medication Sig Start Date End Date Taking? Authorizing Provider  aspirin 81 MG tablet Take 81 mg by mouth daily.   Yes [provider]  carbidopa-levodopa (SINEMET IR) 25-100 MG tablet Take 1 tablet by mouth 3 (three) times daily. 7am/11am/4pm 04/23/20  Yes Tat, Octaviano Batty, DO  [facility] B-12 500 MCG tablet TAKE 1 TABLET (500 MCG TOTAL) BY MOUTH DAILY. Patient taking differently: Take 500 mcg by mouth daily.  04/30/18  Yes Myles Lipps, MD  KLOR-CON M20 20 MEQ tablet TAKE 1 TABLET BY MOUTH TWICE A DAY Patient taking differently: Take 20 mEq by mouth.  03/14/20  Yes Koren Shiver  M, MD  levothyroxine (SYNTHROID) 150 MCG tablet Take 1 tablet (150 mcg total) by mouth daily. 04/16/20  Yes Myles Lipps, MD  Multiple Vitamin (MULTIVITAMIN WITH MINERALS) TABS Take 1 tablet by mouth daily.   Yes [provider]  naproxen sodium (ALEVE) 220 MG tablet Take 220 mg by mouth 2 (two) times daily as needed (pain).   Yes [provider]  nystatin (MYCOSTATIN/NYSTOP) powder Apply 1 application topically 2 (two) times daily. Patient taking differently: Apply 1 application topically daily as needed.  04/17/20  Yes Myles Lipps, MD  olmesartan (BENICAR) 5 MG tablet Take 2 tablets (10 mg total) by mouth daily.  06/18/20  Yes Myles Lipps, MD  rosuvastatin (CRESTOR) 10 MG tablet Take 1 tablet (10 mg total) by mouth daily. 06/18/20  Yes Myles Lipps, MD  trimethoprim (TRIMPEX) 100 MG tablet Take 100 mg by mouth daily.  10/04/18  Yes [provider]  furosemide (LASIX) 20 MG tablet TAKE 1 TABLET BY MOUTH TWICE A DAY Patient not taking: Reported on 08/15/2020 03/14/20   Myles Lipps, MD    Allergies    Patient has no known allergies.  Review of Systems   Review of Systems  Constitutional: Negative for chills and fever.  HENT: Negative for ear pain and sore throat.   Eyes: Positive for visual disturbance. Negative for photophobia, pain, discharge, redness and itching.  Respiratory: Negative for cough and shortness of breath.   Cardiovascular: Negative for chest pain.  Gastrointestinal: Negative for abdominal pain, constipation, diarrhea, nausea and vomiting.  Genitourinary: Negative for dysuria and hematuria.  Musculoskeletal: Negative for back pain.  Skin: Negative for rash.  Neurological: Negative for dizziness, weakness, light-headedness, numbness and headaches.  All other systems reviewed and are negative.   Physical Exam Updated Vital Signs BP (!) 172/87   Pulse 73   Temp 98.6 F (37 C) (Oral)   Resp 12   Ht 5\' 1"  (1.549 m)   Wt 90.3 kg   SpO2 100%   BMI 37.60 kg/m   Physical Exam Vitals and nursing note reviewed.  Constitutional:      General: She is not in acute distress.    Appearance: She is well-developed.  HENT:     Head: Normocephalic and atraumatic.  Eyes:     Extraocular Movements: Extraocular movements intact.     Conjunctiva/sclera: Conjunctivae normal.     Pupils: Pupils are equal, round, and reactive to light.     Comments: No pain with EOM. fluorescein stain completed and was without uptake. IOP 16 mmHg on right and 15 mmHg on the left.   Cardiovascular:     Rate and Rhythm: Normal rate and regular rhythm.     Heart sounds: Normal heart  sounds. No murmur heard.   Pulmonary:     Effort: Pulmonary effort is normal. No respiratory distress.     Breath sounds: Normal breath sounds.  Abdominal:     Palpations: Abdomen is soft.     Tenderness: There is no abdominal tenderness.  Musculoskeletal:     Cervical back: Neck supple.  Skin:    General: Skin is warm and dry.  Neurological:     Mental Status: She is alert.     Comments: Mental Status:  Alert, thought content appropriate, able to give a coherent history. Speech fluent without evidence of aphasia. Able to follow 2 step commands without difficulty.  Cranial Nerves:  II:  Visual field cut noted pupils equal, round, reactive to  light III,IV, VI: possible visual field deficit to superior vision fields bilaterally, ptosis not present, extra-ocular motions intact bilaterally  V,VII: smile symmetric, facial light touch sensation equal VIII: hearing grossly normal to voice  X: uvula elevates symmetrically  XI: bilateral shoulder shrug symmetric and strong XII: midline tongue extension without fassiculations Motor:  Normal tone. 5/5 strength of BUE and BLE major muscle groups including strong and equal grip strength and dorsiflexion/plantar flexion Sensory: light touch normal in all extremities.      ED Results / Procedures / Treatments   Labs (all labs ordered are listed, but only abnormal results are displayed) Labs Reviewed  CBC - Abnormal; Notable for the following components:      Result Value   Hemoglobin 9.5 (*)    HCT 29.4 (*)    MCV 73.9 (*)    MCH 23.9 (*)    All other components within normal limits  URINALYSIS, ROUTINE W REFLEX MICROSCOPIC - Abnormal; Notable for the following components:   APPearance HAZY (*)    Hgb urine dipstick SMALL (*)    Leukocytes,Ua LARGE (*)    Bacteria, UA MANY (*)    All other components within normal limits  PROTIME-INR  APTT  DIFFERENTIAL  COMPREHENSIVE METABOLIC PANEL    EKG None  Radiology MR BRAIN WO  CONTRAST  Result Date: 08/15/2020 CLINICAL DATA:  Vision loss. EXAM: MRI HEAD WITHOUT CONTRAST TECHNIQUE: Multiplanar, multiecho pulse sequences of the brain and surrounding structures were obtained without intravenous contrast. COMPARISON:  MRI 04/09/2018. FINDINGS: Brain: No acute infarction, hemorrhage, hydrocephalus, extra-axial collection or mass lesion. Scattered T2/FLAIR hyperintensities, nonspecific but most likely the sequela of chronic microvascular ischemic disease. Vascular: Major arterial flow voids are maintained at the skull base. Skull and upper cervical spine: Normal marrow signal. Sinuses/Orbits: Visualized sinuses are clear.  Unremarkable orbits. Other: Moderate left mastoid effusion. Unremarkable appearance of the sella. IMPRESSION: 1. No evidence of acute intracranial abnormality. Specifically, no acute infarct. 2. Chronic microvascular ischemic disease. Electronically Signed   By: Feliberto HartsFrederick S Jones MD   On: 08/15/2020 09:41    Procedures Procedures (including critical care time)    Visual Acuity  Right Eye Distance:   Left Eye Distance:   Bilateral Distance:    Right Eye Near: R Near: 20/160 Left Eye Near:  L Near: 20/32 Bilateral Near:  20/25   Medications Ordered in ED Medications  tetracaine (PONTOCAINE) 0.5 % ophthalmic solution 2 drop (2 drops Right Eye Given 08/15/20 1216)  fluorescein ophthalmic strip 1 strip (1 strip Right Eye Given 08/15/20 1216)    ED Course  I have reviewed the triage vital signs and the nursing notes.  Pertinent labs & imaging results that were available during my care of the patient were reviewed by me and considered in my medical decision making (see chart for details).    MDM Rules/Calculators/A&P                          69 y/o F presenting with 2 week hx of visual changes to the right eye that are intermittent but worsened last night.   Patient with visual field cut on exam but otherwise neurologic exam seems  unremarkable.  Discussed case with neurology, Dr. Derry LoryKhaliqdina who is in agreement with MRI brain to r/o stroke  Reviewed/interpreted labs CBC shows anemia, otherwise reassuring.  Anemia is slightly worse than baseline CMP and coags are unremarkable UA shows leukocytes, 11-20 WBCs and many bacteria however patient  is asymptomatic therefore low suspicion for UTI.  MRI brain with 1. No evidence of acute intracranial abnormality. Specifically, no acute infarct. 2. Chronic microvascular ischemic disease. Orbits were unremarkable on MRI.  Eye exam revealed significantly decreased visual acuity. She otherwise had normal pressures and fluorescein stain was negative. Bedside US was completed along with Dr. Rosalia Hammers. [place] was abnormal and was not diagnostic but did raise concern for possible vitreous hemorrhage.   12:06 PM CONSULT with Dr. Mckinley Jewel with [facility]. She recommends that patient be seen in the office for further assessment. She can see the patient immediately. She did not feel pt needed MRI orbits prior to eval in the office.  Discussed w/u thus far and plan for ophthalmology evaluation upon discharge from the ED. Advised husband to take pt directly to the office and to not stop along the way. They voiced understanding and were in agreement. All questions answered, pt stable for discharge.   Final Clinical Impression(s) / ED Diagnoses Final diagnoses:  Blurred vision    Rx / DC Orders ED Discharge Orders    None       Karrie Meres, PA-C 08/15/20 1347    Karrie Meres, PA-C 08/15/20 1347    Margarita Grizzle, MD 08/17/20 1204

## 2020-08-15 NOTE — ED Triage Notes (Signed)
Pt reports blurred vision to R eye since last night that started after using eye relief to wash eye out.  Denies pain.  States it was the first time she used eye wash.

## 2020-08-15 NOTE — Discharge Instructions (Signed)
Go directly to the ophthalmology office to see Dr. Maryelizabeth Kaufmann. Do not stop along the way.   Please return to the emergency department for any new or worsening symptoms.

## 2020-08-15 NOTE — ED Notes (Signed)
Patient off floor to MRI

## 2020-08-15 NOTE — H&P (Signed)
Date of examination:  08/16/20  Indication for surgery: hemorrhagic retinal detachment right eye  Pertinent past medical history:      Past Medical History:  Diagnosis Date  . Anemia, unspecified   . Hyperlipidemia, unspecified   . Hypertension   . Hypothyroidism, unspecified   . Pre-diabetes   . Vitamin B12 deficiency     Pertinent ocular history:  Hypertensive retinopathy   Pertinent family history:       Family History  Problem Relation Age of Onset  . Renal Disease Sister   . Cancer Sister     General:  Healthy appearing patient in no distress.    Eyes:               Acuity OD HM               External: Within normal limits                 Anterior segment:  Narrow angles                           Fundus: vitreous hemorrhage and hemorrhagic retinal detachment right eye                  Impression: hemorrhagic retinal detachment right eye  Plan:   hemorrhagic retinal detachment repair right eye  Carmel Waddington, MD  

## 2020-08-16 ENCOUNTER — Inpatient Hospital Stay (HOSPITAL_COMMUNITY): Payer: Medicare Other | Admitting: Certified Registered"

## 2020-08-16 ENCOUNTER — Encounter (HOSPITAL_COMMUNITY): Payer: Self-pay | Admitting: Ophthalmology

## 2020-08-16 ENCOUNTER — Ambulatory Visit (HOSPITAL_COMMUNITY)
Admission: RE | Admit: 2020-08-16 | Discharge: 2020-08-16 | Disposition: A | Discharge status: Home / Self Care | Payer: Medicare Other | Attending: Ophthalmology | Admitting: Ophthalmology

## 2020-08-16 ENCOUNTER — Encounter (HOSPITAL_COMMUNITY)
Admission: RE | Disposition: A | Discharge status: Home / Self Care | Payer: Self-pay | Source: Home / Self Care | Attending: Ophthalmology

## 2020-08-16 DIAGNOSIS — I10 Essential (primary) hypertension: Secondary | ICD-10-CM | POA: Insufficient documentation

## 2020-08-16 DIAGNOSIS — R7303 Prediabetes: Secondary | ICD-10-CM | POA: Insufficient documentation

## 2020-08-16 DIAGNOSIS — Z87891 Personal history of nicotine dependence: Secondary | ICD-10-CM | POA: Insufficient documentation

## 2020-08-16 DIAGNOSIS — Z6837 Body mass index (BMI) 37.0-37.9, adult: Secondary | ICD-10-CM | POA: Insufficient documentation

## 2020-08-16 DIAGNOSIS — Z841 Family history of disorders of kidney and ureter: Secondary | ICD-10-CM | POA: Diagnosis not present

## 2020-08-16 DIAGNOSIS — H35732 Hemorrhagic detachment of retinal pigment epithelium, left eye: Secondary | ICD-10-CM | POA: Diagnosis not present

## 2020-08-16 DIAGNOSIS — E039 Hypothyroidism, unspecified: Secondary | ICD-10-CM | POA: Insufficient documentation

## 2020-08-16 DIAGNOSIS — D649 Anemia, unspecified: Secondary | ICD-10-CM | POA: Diagnosis not present

## 2020-08-16 DIAGNOSIS — H33011 Retinal detachment with single break, right eye: Secondary | ICD-10-CM | POA: Insufficient documentation

## 2020-08-16 DIAGNOSIS — Z809 Family history of malignant neoplasm, unspecified: Secondary | ICD-10-CM | POA: Diagnosis not present

## 2020-08-16 DIAGNOSIS — Z20822 Contact with and (suspected) exposure to covid-19: Secondary | ICD-10-CM | POA: Insufficient documentation

## 2020-08-16 DIAGNOSIS — E7800 Pure hypercholesterolemia, unspecified: Secondary | ICD-10-CM | POA: Diagnosis not present

## 2020-08-16 DIAGNOSIS — E119 Type 2 diabetes mellitus without complications: Secondary | ICD-10-CM | POA: Diagnosis not present

## 2020-08-16 HISTORY — PX: PHOTOCOAGULATION WITH LASER: SHX6027

## 2020-08-16 HISTORY — PX: PARS PLANA VITRECTOMY: SHX2166

## 2020-08-16 LAB — GLUCOSE, CAPILLARY: Glucose-Capillary: 83 mg/dL (ref 70–99)

## 2020-08-16 LAB — SARS CORONAVIRUS 2 BY RT PCR (HOSPITAL ORDER, PERFORMED IN ~~LOC~~ HOSPITAL LAB): SARS Coronavirus 2: NEGATIVE

## 2020-08-16 SURGERY — PARS PLANA VITRECTOMY 25 GAUGE FOR ENDOPHTHALMITIS
Anesthesia: General | Site: Eye | Laterality: Right

## 2020-08-16 MED ORDER — BSS IO SOLN
INTRAOCULAR | Status: AC
  Filled 2020-08-16: qty 15

## 2020-08-16 MED ORDER — ROCURONIUM BROMIDE 10 MG/ML (PF) SYRINGE
PREFILLED_SYRINGE | INTRAVENOUS | Status: DC | PRN
  Administered 2020-08-16 (×2): 50 mg via INTRAVENOUS

## 2020-08-16 MED ORDER — PROPOFOL 10 MG/ML IV BOLUS
INTRAVENOUS | Status: AC
  Filled 2020-08-16: qty 20

## 2020-08-16 MED ORDER — OXYCODONE HCL 5 MG/5ML PO SOLN: 5.0000 mg | Freq: Once | ORAL | Status: DC | PRN

## 2020-08-16 MED ORDER — ORAL CARE MOUTH RINSE: 15.0000 mL | Freq: Once | OROMUCOSAL | Status: AC

## 2020-08-16 MED ORDER — ATROPINE SULFATE 1 % OP SOLN
OPHTHALMIC | Status: AC
  Filled 2020-08-16: qty 5

## 2020-08-16 MED ORDER — INDOCYANINE GREEN 25 MG IV SOLR
INTRAVENOUS | Status: AC
  Filled 2020-08-16: qty 10

## 2020-08-16 MED ORDER — EPINEPHRINE PF 1 MG/ML IJ SOLN
INTRAMUSCULAR | Status: AC
  Filled 2020-08-16: qty 1

## 2020-08-16 MED ORDER — MIDAZOLAM HCL 2 MG/2ML IJ SOLN: 0.5000 mg | Freq: Once | INTRAMUSCULAR | Status: DC | PRN

## 2020-08-16 MED ORDER — HYPROMELLOSE (GONIOSCOPIC) 2.5 % OP SOLN
OPHTHALMIC | Status: DC | PRN
  Administered 2020-08-16: 1 [drp] via OPHTHALMIC

## 2020-08-16 MED ORDER — PROMETHAZINE HCL 25 MG/ML IJ SOLN: 6.2500 mg | INTRAMUSCULAR | Status: DC | PRN

## 2020-08-16 MED ORDER — MEPERIDINE HCL 25 MG/ML IJ SOLN: 6.2500 mg | INTRAMUSCULAR | Status: DC | PRN

## 2020-08-16 MED ORDER — TRYPAN BLUE 0.15 % OP SOLN
0.5000 mL | OPHTHALMIC | Status: DC
  Filled 2020-08-16: qty 0.5

## 2020-08-16 MED ORDER — SUGAMMADEX SODIUM 200 MG/2ML IV SOLN
INTRAVENOUS | Status: DC | PRN
  Administered 2020-08-16: 200 mg via INTRAVENOUS

## 2020-08-16 MED ORDER — LACTATED RINGERS IV SOLN: INTRAVENOUS | Status: DC

## 2020-08-16 MED ORDER — LIDOCAINE HCL 2 % IJ SOLN
INTRAMUSCULAR | Status: AC
  Filled 2020-08-16: qty 20

## 2020-08-16 MED ORDER — CEFAZOLIN SUBCONJUNCTIVAL INJECTION 100 MG/0.5 ML
100.0000 mg | INJECTION | SUBCONJUNCTIVAL | Status: DC
  Filled 2020-08-16: qty 5

## 2020-08-16 MED ORDER — DEXAMETHASONE SODIUM PHOSPHATE 10 MG/ML IJ SOLN
INTRAMUSCULAR | Status: DC | PRN
  Administered 2020-08-16: 10 mg via INTRAVENOUS

## 2020-08-16 MED ORDER — FENTANYL CITRATE (PF) 250 MCG/5ML IJ SOLN
INTRAMUSCULAR | Status: AC
  Filled 2020-08-16: qty 5

## 2020-08-16 MED ORDER — TOBRAMYCIN-DEXAMETHASONE 0.3-0.1 % OP OINT
TOPICAL_OINTMENT | OPHTHALMIC | Status: AC
  Filled 2020-08-16: qty 3.5

## 2020-08-16 MED ORDER — ONDANSETRON HCL 4 MG/2ML IJ SOLN
INTRAMUSCULAR | Status: AC
  Filled 2020-08-16: qty 2

## 2020-08-16 MED ORDER — MIDAZOLAM HCL 2 MG/2ML IJ SOLN
INTRAMUSCULAR | Status: DC | PRN
  Administered 2020-08-16: 1 mg via INTRAVENOUS

## 2020-08-16 MED ORDER — OXYCODONE HCL 5 MG PO TABS: 5.0000 mg | ORAL_TABLET | Freq: Once | ORAL | Status: DC | PRN

## 2020-08-16 MED ORDER — DEXAMETHASONE SODIUM PHOSPHATE 10 MG/ML IJ SOLN
INTRAMUSCULAR | Status: DC | PRN
  Administered 2020-08-16: 10 mg

## 2020-08-16 MED ORDER — TOBRAMYCIN-DEXAMETHASONE 0.3-0.1 % OP OINT
TOPICAL_OINTMENT | OPHTHALMIC | Status: DC | PRN
  Administered 2020-08-16: 1 via OPHTHALMIC

## 2020-08-16 MED ORDER — CHLORHEXIDINE GLUCONATE 0.12 % MT SOLN: 15.0000 mL | Freq: Once | OROMUCOSAL | Status: AC

## 2020-08-16 MED ORDER — BSS PLUS IO SOLN
INTRAOCULAR | Status: AC
  Filled 2020-08-16: qty 500

## 2020-08-16 MED ORDER — HYALURONIDASE HUMAN 150 UNIT/ML IJ SOLN
INTRAMUSCULAR | Status: AC
  Filled 2020-08-16: qty 1

## 2020-08-16 MED ORDER — HYPROMELLOSE (GONIOSCOPIC) 2.5 % OP SOLN
OPHTHALMIC | Status: AC
  Filled 2020-08-16: qty 15

## 2020-08-16 MED ORDER — MIDAZOLAM HCL 2 MG/2ML IJ SOLN
INTRAMUSCULAR | Status: AC
  Filled 2020-08-16: qty 2

## 2020-08-16 MED ORDER — TETRACAINE HCL 0.5 % OP SOLN
OPHTHALMIC | Status: AC
  Filled 2020-08-16: qty 4

## 2020-08-16 MED ORDER — PHENYLEPHRINE HCL 2.5 % OP SOLN
1.0000 [drp] | OPHTHALMIC | Status: AC | PRN
  Administered 2020-08-16 (×3): 1 [drp] via OPHTHALMIC
  Filled 2020-08-16: qty 2

## 2020-08-16 MED ORDER — DEXAMETHASONE SODIUM PHOSPHATE 10 MG/ML IJ SOLN
INTRAMUSCULAR | Status: AC
  Filled 2020-08-16: qty 1

## 2020-08-16 MED ORDER — CEFAZOLIN SUBCONJUNCTIVAL INJECTION 100 MG/0.5 ML
INJECTION | SUBCONJUNCTIVAL | Status: DC | PRN
  Administered 2020-08-16: 100 mg via SUBCONJUNCTIVAL

## 2020-08-16 MED ORDER — CYCLOPENTOLATE HCL 1 % OP SOLN
1.0000 [drp] | OPHTHALMIC | Status: AC | PRN
  Administered 2020-08-16 (×3): 1 [drp] via OPHTHALMIC
  Filled 2020-08-16: qty 2

## 2020-08-16 MED ORDER — SODIUM CHLORIDE 0.9 % IV SOLN: INTRAVENOUS | Status: DC

## 2020-08-16 MED ORDER — PHENYLEPHRINE HCL-NACL 10-0.9 MG/250ML-% IV SOLN
INTRAVENOUS | Status: DC | PRN
  Administered 2020-08-16: 20 ug/min via INTRAVENOUS

## 2020-08-16 MED ORDER — LIDOCAINE 2% (20 MG/ML) 5 ML SYRINGE
INTRAMUSCULAR | Status: DC | PRN
  Administered 2020-08-16: 40 mg via INTRAVENOUS

## 2020-08-16 MED ORDER — FENTANYL CITRATE (PF) 250 MCG/5ML IJ SOLN
INTRAMUSCULAR | Status: DC | PRN
  Administered 2020-08-16: 50 ug via INTRAVENOUS

## 2020-08-16 MED ORDER — PHENYLEPHRINE 40 MCG/ML (10ML) SYRINGE FOR IV PUSH (FOR BLOOD PRESSURE SUPPORT)
PREFILLED_SYRINGE | INTRAVENOUS | Status: DC | PRN
  Administered 2020-08-16: 40 ug via INTRAVENOUS
  Administered 2020-08-16: 160 ug via INTRAVENOUS

## 2020-08-16 MED ORDER — EPINEPHRINE PF 1 MG/ML IJ SOLN: INTRAOCULAR | Status: DC | PRN

## 2020-08-16 MED ORDER — PROPOFOL 10 MG/ML IV BOLUS
INTRAVENOUS | Status: DC | PRN
  Administered 2020-08-16: 100 mg via INTRAVENOUS

## 2020-08-16 MED ORDER — ONDANSETRON HCL 4 MG/2ML IJ SOLN
INTRAMUSCULAR | Status: DC | PRN
  Administered 2020-08-16: 4 mg via INTRAVENOUS

## 2020-08-16 MED ORDER — PROPARACAINE HCL 0.5 % OP SOLN
1.0000 [drp] | OPHTHALMIC | Status: AC | PRN
  Administered 2020-08-16 (×3): 1 [drp] via OPHTHALMIC
  Filled 2020-08-16: qty 15

## 2020-08-16 MED ORDER — FENTANYL CITRATE (PF) 100 MCG/2ML IJ SOLN: 25.0000 ug | INTRAMUSCULAR | Status: DC | PRN

## 2020-08-16 MED ORDER — BUPIVACAINE HCL (PF) 0.75 % IJ SOLN
INTRAMUSCULAR | Status: AC
  Filled 2020-08-16: qty 10

## 2020-08-16 MED ORDER — OFLOXACIN 0.3 % OP SOLN
1.0000 [drp] | OPHTHALMIC | Status: AC | PRN
  Administered 2020-08-16 (×3): 1 [drp] via OPHTHALMIC
  Filled 2020-08-16: qty 5

## 2020-08-16 MED ORDER — CHLORHEXIDINE GLUCONATE 0.12 % MT SOLN
OROMUCOSAL | Status: AC
  Administered 2020-08-16: 15 mL via OROMUCOSAL
  Filled 2020-08-16: qty 15

## 2020-08-16 MED ORDER — LIDOCAINE HCL 2 % IJ SOLN
INTRAMUSCULAR | Status: DC | PRN
  Administered 2020-08-16: 5 mL via RETROBULBAR

## 2020-08-16 SURGICAL SUPPLY — 60 items
APPLICATOR COTTON TIP 6 STRL (MISCELLANEOUS) ×1 IMPLANT
APPLICATOR COTTON TIP 6IN STRL (MISCELLANEOUS) ×2
BAND WRIST GAS GREEN (MISCELLANEOUS) IMPLANT
BLADE MVR KNIFE 20G (BLADE) IMPLANT
BNDG EYE OVAL (GAUZE/BANDAGES/DRESSINGS) ×2 IMPLANT
CANNULA ANT CHAM MAIN (OPHTHALMIC RELATED) IMPLANT
CANNULA DUAL BORE 23G (CANNULA) IMPLANT
CANNULA DUALBORE 25G (CANNULA) IMPLANT
CANNULA VLV SOFT TIP 25GA (OPHTHALMIC) ×2 IMPLANT
CAUTERY EYE LOW TEMP 1300F FIN (OPHTHALMIC RELATED) IMPLANT
CLSR STERI-STRIP ANTIMIC 1/2X4 (GAUZE/BANDAGES/DRESSINGS) ×2 IMPLANT
CORD BIPOLAR FORCEPS 12FT (ELECTRODE) IMPLANT
COVER MAYO STAND STRL (DRAPES) IMPLANT
COVER WAND RF STERILE (DRAPES) IMPLANT
DRAPE HALF SHEET 40X57 (DRAPES) ×2 IMPLANT
DRAPE INCISE 51X51 W/FILM STRL (DRAPES) IMPLANT
DRAPE RETRACTOR (MISCELLANEOUS) IMPLANT
ERASER HMR WETFIELD 23G BP (MISCELLANEOUS) IMPLANT
FORCEPS ECKARDT ILM 25G SERR (OPHTHALMIC RELATED) IMPLANT
FORCEPS GRIESHABER ILM 25G A (INSTRUMENTS) IMPLANT
GAS AUTO FILL CONSTEL (OPHTHALMIC)
GAS AUTO FILL CONSTELLATION (OPHTHALMIC) IMPLANT
GAS WRIST BAND GREEN (MISCELLANEOUS)
GAUZE SPONGE 4X4 12PLY STRL (GAUZE/BANDAGES/DRESSINGS) ×2 IMPLANT
GLOVE SURG SYN 7.5  E (GLOVE) ×1
GLOVE SURG SYN 7.5 E (GLOVE) ×1 IMPLANT
GOWN STRL REUS W/ TWL LRG LVL3 (GOWN DISPOSABLE) ×1 IMPLANT
GOWN STRL REUS W/TWL LRG LVL3 (GOWN DISPOSABLE) ×1
HANDLE PNEUMATIC FOR CONSTEL (OPHTHALMIC) IMPLANT
KIT BASIN OR (CUSTOM PROCEDURE TRAY) ×2 IMPLANT
KIT TURNOVER KIT B (KITS) ×2 IMPLANT
LENS BIOM SUPER VIEW SET DISP (MISCELLANEOUS) ×2 IMPLANT
MICROPICK 25G (MISCELLANEOUS)
NEEDLE 18GX1X1/2 (RX/OR ONLY) (NEEDLE) ×2 IMPLANT
NEEDLE 25GX 5/8IN NON SAFETY (NEEDLE) ×2 IMPLANT
NEEDLE FILTER BLUNT 18X 1/2SAF (NEEDLE) ×3
NEEDLE FILTER BLUNT 18X1 1/2 (NEEDLE) ×3 IMPLANT
NEEDLE HYPO 25GX1X1/2 BEV (NEEDLE) IMPLANT
NEEDLE HYPO 30X.5 LL (NEEDLE) IMPLANT
NEEDLE RETROBULBAR 25GX1.5 (NEEDLE) ×2 IMPLANT
NS IRRIG 1000ML POUR BTL (IV SOLUTION) IMPLANT
PACK VITRECTOMY CUSTOM (CUSTOM PROCEDURE TRAY) ×2 IMPLANT
PAD ARMBOARD 7.5X6 YLW CONV (MISCELLANEOUS) ×4 IMPLANT
PAK PIK VITRECTOMY CVS 25GA (OPHTHALMIC) ×2 IMPLANT
PICK MICROPICK 25G (MISCELLANEOUS) IMPLANT
PROBE ENDO DIATHERMY 25G (MISCELLANEOUS) IMPLANT
PROBE LASER ILLUM FLEX CVD 25G (OPHTHALMIC) ×2 IMPLANT
ROLLS DENTAL (MISCELLANEOUS) IMPLANT
SCRAPER DIAMOND 25GA (OPHTHALMIC RELATED) IMPLANT
SHIELD EYE LENSE ONLY DISP (GAUZE/BANDAGES/DRESSINGS) ×2 IMPLANT
SOL ANTI FOG 6CC (MISCELLANEOUS) ×1 IMPLANT
SOLUTION ANTI FOG 6CC (MISCELLANEOUS) ×1
STOPCOCK 4 WAY LG BORE MALE ST (IV SETS) IMPLANT
SUT VICRYL 7 0 TG140 8 (SUTURE) IMPLANT
SYR 10ML LL (SYRINGE) IMPLANT
SYR 20ML LL LF (SYRINGE) IMPLANT
SYR 5ML LL (SYRINGE) IMPLANT
SYR TB 1ML LUER SLIP (SYRINGE) ×2 IMPLANT
TOWEL GREEN STERILE FF (TOWEL DISPOSABLE) ×2 IMPLANT
WATER STERILE IRR 1000ML POUR (IV SOLUTION) ×2 IMPLANT

## 2020-08-16 NOTE — Brief Op Note (Signed)
08/16/2020  10:26 AM  PATIENT:  [facility]  69 y.o. female  PRE-OPERATIVE DIAGNOSIS:  HEMORRHAGIC RETINAL DETACHMENT, OD  POST-OPERATIVE DIAGNOSIS:  HEMORRHAGIC RETINAL DETACHMENT, OD  PROCEDURE:  Procedure(s): Hemorrhagic retinal detachment repair right eye 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 (Right) PHOTOCOAGULATION WITH LASER (Right) Air/fluid exchange, membranectomy right eye  SURGEON:  Surgeon(s) and Role:    * Carmela Rima, MD - Primary  PHYSICIAN ASSISTANT:   ASSISTANTS: none   ANESTHESIA:   local and general  EBL:  minimal   BLOOD ADMINISTERED:none  DRAINS: none   LOCAL MEDICATIONS USED:  MARCAINE    and LIDOCAINE   SPECIMEN:  No Specimen  DISPOSITION OF SPECIMEN:  N/A  COUNTS:  YES  TOURNIQUET:  * No tourniquets in log *  DICTATION: .Note written in EPIC  PLAN OF CARE: Discharge to home after PACU  PATIENT DISPOSITION:  PACU - hemodynamically stable.   Delay start of Pharmacological VTE agent (>24hrs) due to surgical blood loss or risk of bleeding: not applicable

## 2020-08-16 NOTE — Anesthesia Postprocedure Evaluation (Signed)
Anesthesia Post Note  Patient: [facility]  Procedure(s) Performed: 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 (Right Eye) PHOTOCOAGULATION WITH LASER (Right Eye)     Patient location during evaluation: PACU Anesthesia Type: General Level of consciousness: awake and alert, patient cooperative and oriented Pain management: pain level controlled Vital Signs Assessment: post-procedure vital signs reviewed and stable Respiratory status: spontaneous breathing, nonlabored ventilation and respiratory function stable Cardiovascular status: blood pressure returned to baseline and stable Postop Assessment: no apparent nausea or vomiting Anesthetic complications: no   No complications documented.  Last Vitals:  Vitals:   08/16/20 1115 08/16/20 1130  BP: (!) 165/71 (!) 165/70  Pulse: 70 75  Resp: 16 15  Temp:  36.7 C  SpO2: 100% 99%    Last Pain:  Vitals:   08/16/20 1130  TempSrc:   PainSc: 0-No pain                 Prarthana Parlin,E. Adalae Baysinger

## 2020-08-16 NOTE — Transfer of Care (Signed)
Immediate Anesthesia Transfer of Care Note  Patient: Anna [facility]  Procedure(s) Performed: 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 (Right Eye) PHOTOCOAGULATION WITH LASER (Right Eye)  Patient Location: PACU  Anesthesia Type:General  Level of Consciousness: drowsy and patient cooperative  Airway & Oxygen Therapy: Patient Spontanous Breathing and Patient connected to face mask oxygen  Post-op Assessment: Report given to RN and Post -op Vital signs reviewed and stable  Post vital signs: Reviewed and stable  Last Vitals:  Vitals Value Taken Time  BP 151/62 08/16/20 1045  Temp    Pulse 72 08/16/20 1046  Resp 36 08/16/20 1046  SpO2 100 % 08/16/20 1046  Vitals shown include unvalidated device data.  Last Pain:  Vitals:   08/16/20 1045  TempSrc:   PainSc: (P) 0-No pain      Patients Stated Pain Goal: 3 (08/16/20 0820)  Complications: No complications documented.

## 2020-08-16 NOTE — Anesthesia Procedure Notes (Signed)
Procedure Name: Intubation Date/Time: 08/16/2020 9:34 AM Performed by: Rosiland Oz, CRNA Pre-anesthesia Checklist: Patient identified, Emergency Drugs available, Suction available, Patient being monitored and Timeout performed Patient Re-evaluated:Patient Re-evaluated prior to induction Oxygen Delivery Method: Circle system utilized Preoxygenation: Pre-oxygenation with 100% oxygen Induction Type: IV induction Ventilation: Mask ventilation without difficulty and Oral airway inserted - appropriate to patient size Laryngoscope Size: Hyacinth Meeker and 2 Grade View: Grade II Tube type: Oral Tube size: 7.0 mm Number of attempts: 1 Airway Equipment and Method: Stylet Placement Confirmation: ETT inserted through vocal cords under direct vision,  positive ETCO2 and breath sounds checked- equal and bilateral Secured at: 21 cm Tube secured with: Tape Dental Injury: Teeth and Oropharynx as per pre-operative assessment

## 2020-08-16 NOTE — Op Note (Signed)
[facility] 08/16/2020 Diagnosis: Hemorrhagic retinal detachment right eye  Procedure: Pars Plana Vitrectomy, Endolaser, Fluid Gas Exchange and membranectomy Operative Eye:  right eye  Surgeon: Harrold Donath Estimated Blood Loss: minimal Specimens for Pathology:  None Complications: none   The  patient was prepped and draped in the usual fashion for ocular surgery on the  right eye .  A lid speculum was placed.  Infusion line and trocar was placed at the 8 o'clock position approximately 3.5 mm from the surgical limbus.   The infusion line was allowed to run and then clamped when placed at the cannula opening. The line was inserted and secured to the drape with an adhesive strip.   Active trocars/cannula were placed at the 10 and 2 o'clock positions approximately 3.5 mm from the surgical limbus. The cannula was visualized in the vitreous cavity.  The light pipe and vitreous cutter were inserted into the vitreous cavity and a core vitrectomy was performed.  There was a large clot centrally integral with the hyaloid.  Care taken to remove this clot and expose the underlying retina.  A localized hemorrhagic retinal detachment was noted.  The hemorrhage was evacuated and removed with the vitrector.  Remaining hemorrhage was removed from the macula.  The vitrector was used to remove vitreous up to the vitreous base for 360 degrees.   3 rows of endolaser were applied 360 degrees to the periphery.  A complete air-fluid exchange was performed and any remaining hemorrhage removed from the eye  The superior cannulas were sequentially removed with concommitant tamponade using a cotton tipped applicator and noted to be air tight.  The infusion line and trocar were removed and the sclerotomy was noted to be air tight with normal intraocular pressure by digital palpapation.  Subconjunctival injections of Ancef and Dexamethasone 4mg /47ml were placed in the infero-medial quadrant.   The speculum and  drapes were removed and the eye was patched with Polymixin/Bacitracin ophthalmic ointment. An eye shield was placed and the patient was transferred alert and conversant with stable vital signs to the post operative recovery area.  The patient tolerated the procedure well and no complications were noted.  0m MD

## 2020-08-16 NOTE — H&P (Signed)
Date of examination:  08/16/20  Indication for surgery: hemorrhagic retinal detachment right eye  Pertinent past medical history:      Past Medical History:  Diagnosis Date  . Anemia, unspecified   . Hyperlipidemia, unspecified   . Hypertension   . Hypothyroidism, unspecified   . Pre-diabetes   . Vitamin B12 deficiency     Pertinent ocular history:  Hypertensive retinopathy   Pertinent family history:       Family History  Problem Relation Age of Onset  . Renal Disease Sister   . Cancer Sister     General:  Healthy appearing patient in no distress.    Eyes:               Acuity OD HM               External: Within normal limits                 Anterior segment:  Narrow angles                           Fundus: vitreous hemorrhage and hemorrhagic retinal detachment right eye                  Impression: hemorrhagic retinal detachment right eye  Plan:   hemorrhagic retinal detachment repair right eye  Carmela Rima, MD

## 2020-08-16 NOTE — Discharge Instructions (Addendum)
DO NOT SLEEP ON BACK, THE EYE PRESSURE CAN GO UP AND CAUSE VISION LOSS   SLEEP ON SIDE WITH NOSE TO PILLOW  DURING DAY KEEP UPRIGHT 

## 2020-08-16 NOTE — Anesthesia Preprocedure Evaluation (Addendum)
Anesthesia Evaluation  Patient identified by MRN, date of birth, ID band Patient awake    Reviewed: Allergy & Precautions, NPO status , Patient's Chart, lab work & pertinent test results  History of Anesthesia Complications Negative for: history of anesthetic complications  Airway Mallampati: I  TM Distance: >3 FB Neck ROM: Full    Dental  (+) Dental Advisory Given   Pulmonary former smoker,  08/16/2020 SARS coronavirus NEG   breath sounds clear to auscultation       Cardiovascular hypertension, Pt. on medications (-) angina Rhythm:Regular Rate:Normal  '20 ECHO: EF 60-65%, mild LVH, no significant valvular abnormalities   Neuro/Psych Parkinson's    GI/Hepatic negative GI ROS, Neg liver ROS,   Endo/Other  diabetesHypothyroidism Morbid obesity  Renal/GU negative Renal ROS     Musculoskeletal   Abdominal (+) + obese,   Peds  Hematology  (+) Blood dyscrasia (Hb 9.5), anemia ,   Anesthesia Other Findings   Reproductive/Obstetrics                            Anesthesia Physical Anesthesia Plan  ASA: III  Anesthesia Plan: General   Post-op Pain Management:    Induction: Intravenous  PONV Risk Score and Plan: 3 and Ondansetron, Dexamethasone and Treatment may vary due to age or medical condition  Airway Management Planned: Oral ETT  Additional Equipment: None  Intra-op Plan:   Post-operative Plan: Extubation in OR  Informed Consent: I have reviewed the patients History and Physical, chart, labs and discussed the procedure including the risks, benefits and alternatives for the proposed anesthesia with the patient or authorized representative who has indicated his/her understanding and acceptance.     Dental advisory given  Plan Discussed with: CRNA and Surgeon  Anesthesia Plan Comments:        Anesthesia Quick Evaluation

## 2020-08-17 ENCOUNTER — Encounter (HOSPITAL_COMMUNITY): Payer: Self-pay | Admitting: Ophthalmology

## 2020-08-18 ENCOUNTER — Ambulatory Visit: Payer: Medicare Other | Admitting: Physical Therapy

## 2020-08-18 ENCOUNTER — Encounter: Payer: Medicare Other | Admitting: Occupational Therapy

## 2020-08-18 ENCOUNTER — Ambulatory Visit: Payer: Medicare Other | Admitting: Occupational Therapy

## 2020-08-20 ENCOUNTER — Ambulatory Visit: Payer: Medicare Other | Admitting: Occupational Therapy

## 2020-08-20 ENCOUNTER — Ambulatory Visit: Payer: Medicare Other | Admitting: Physical Therapy

## 2020-08-24 DIAGNOSIS — H3561 Retinal hemorrhage, right eye: Secondary | ICD-10-CM | POA: Diagnosis not present

## 2020-08-24 DIAGNOSIS — H35721 Serous detachment of retinal pigment epithelium, right eye: Secondary | ICD-10-CM | POA: Diagnosis not present

## 2020-08-24 DIAGNOSIS — H35011 Changes in retinal vascular appearance, right eye: Secondary | ICD-10-CM | POA: Diagnosis not present

## 2020-08-24 DIAGNOSIS — H35032 Hypertensive retinopathy, left eye: Secondary | ICD-10-CM | POA: Diagnosis not present

## 2020-08-24 DIAGNOSIS — H3509 Other intraretinal microvascular abnormalities: Secondary | ICD-10-CM | POA: Diagnosis not present

## 2020-08-24 DIAGNOSIS — H35731 Hemorrhagic detachment of retinal pigment epithelium, right eye: Secondary | ICD-10-CM | POA: Diagnosis not present

## 2020-08-25 ENCOUNTER — Ambulatory Visit: Payer: Medicare Other | Attending: Neurology | Admitting: Occupational Therapy

## 2020-08-25 ENCOUNTER — Ambulatory Visit: Payer: Medicare Other | Admitting: Physical Therapy

## 2020-08-25 ENCOUNTER — Other Ambulatory Visit: Payer: Self-pay

## 2020-08-25 DIAGNOSIS — R29898 Other symptoms and signs involving the musculoskeletal system: Secondary | ICD-10-CM | POA: Diagnosis not present

## 2020-08-25 DIAGNOSIS — R2681 Unsteadiness on feet: Secondary | ICD-10-CM

## 2020-08-25 DIAGNOSIS — R278 Other lack of coordination: Secondary | ICD-10-CM | POA: Diagnosis not present

## 2020-08-25 DIAGNOSIS — M25621 Stiffness of right elbow, not elsewhere classified: Secondary | ICD-10-CM | POA: Diagnosis not present

## 2020-08-25 DIAGNOSIS — R293 Abnormal posture: Secondary | ICD-10-CM | POA: Diagnosis not present

## 2020-08-25 DIAGNOSIS — M6281 Muscle weakness (generalized): Secondary | ICD-10-CM | POA: Insufficient documentation

## 2020-08-25 DIAGNOSIS — R2689 Other abnormalities of gait and mobility: Secondary | ICD-10-CM | POA: Insufficient documentation

## 2020-08-25 DIAGNOSIS — R29818 Other symptoms and signs involving the nervous system: Secondary | ICD-10-CM

## 2020-08-25 DIAGNOSIS — M25611 Stiffness of right shoulder, not elsewhere classified: Secondary | ICD-10-CM | POA: Insufficient documentation

## 2020-08-25 NOTE — Therapy (Signed)
[facility] [address] [place], [place], [address] Phone: [phone]   Fax:  [phone]  Occupational Therapy Treatment  Patient Details  Name: Anna Bernard MRN: [id number] Date of Birth: 05-24-1951 Referring Provider (OT): Dr. Lurena Joiner Tat   Encounter Date: 08/25/2020   OT End of Session - 08/25/20 1049    Visit Number 5    Number of Visits 17    Date for OT Re-Evaluation 09/11/20    Authorization Type Medicare UHC:  no visit limit or auth required    Authorization Time Period cert. date 07/13/20-10/11/20    Authorization - Visit Number 5    Authorization - Number of Visits 10    OT Start Time 1022    OT Stop Time 1100    OT Time Calculation (min) 38 min    Activity Tolerance Patient tolerated treatment well    Behavior During Therapy WFL for tasks assessed/performed           Past Medical History:  Diagnosis Date  . Anemia, unspecified   . Hyperlipidemia, unspecified   . Hypertension   . Hypothyroidism, unspecified   . Pre-diabetes   . Vitamin B12 deficiency     Past Surgical History:  Procedure Laterality Date  . ABDOMINAL HYSTERECTOMY    . GIVENS CAPSULE STUDY N/A 04/16/2018   Procedure: GIVENS CAPSULE STUDY;  Surgeon: Jeani Hawking, MD;  Location: [facility] ENDOSCOPY;  Service: Endoscopy;  Laterality: N/A;  . 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 PHOTOCOAGULATION WITH LASER Right 08/16/2020   Procedure: PHOTOCOAGULATION WITH LASER;  Surgeon: Carmela Rima, MD;  Location: [facility] OR;  Service: Ophthalmology;  Laterality: Right;  . SHOULDER SURGERY    . TONSILLECTOMY    . TUBAL LIGATION      There were no vitals filed for this visit.   Subjective Assessment - 08/25/20 1224    Subjective  Pt reports she has not been  exercising due to eye surgery    Pertinent History newly diagnosed PD (in last month).   PMH:  anemia, hyperlipidemia, HTN, hypothyroidism, Vitamin B12 deficiency, hx of R RTC surgery, leg swelling, arthritis (per pt), obesity, orthostatic hypotension    Patient Stated Goals be able to move better and travel (to [place] first)    Currently in Pain? Yes    Pain Score 5     Pain Location Generalized    Pain Type Chronic pain    Pain Onset More than a month ago    Pain Frequency Constant    Aggravating Factors  not taking meds    Pain Relieving Factors meds                                OT Treatment/Education - 08/25/20 1223    Education Details Coordination HEP review with focus on large amplitude movements; PWR! hands (up and rock only) mod v.c and increased time for all activities   Person(s) Educated Patient    Methods Explanation;Demonstration;Verbal cues    Comprehension Verbalized understanding;Returned demonstration;Verbal cues required            OT Short Term Goals - 08/25/20 1223      OT SHORT TERM GOAL #1   Title Pt will be independent with PD-specific HEP--check STGs 09/11/20  Time 4    Period Weeks    Status New      OT SHORT TERM GOAL #2   Title Pt will demo at least 110* R shoulder flex with -40* elbow ext for functional reach.    Baseline 100* with -45* elbow ext    Time 4    Period Weeks    Status New      OT SHORT TERM GOAL #3   Title Pt will improve R hand coordination for ADLs as shown by improving time on 9-hole peg test by at least 6sec.    Baseline 56.84sec    Time 4    Period Weeks    Status New      OT SHORT TERM GOAL #4   Title Pt willdemonstrate improved ease with dressing as evidenced by  performing  PPT#4 donning/ doffing jacket in 1 min 15 secs    Baseline 1 min 29 secs    Time 4    Period Weeks    Status New      OT SHORT TERM GOAL #5   Title Pt will verbalize understanding of appropriate community resources and  ways to decr risk of future complications.    Time 4    Period Weeks    Status New             OT Long Term Goals - 07/13/20 2158      OT LONG TERM GOAL #1   Title Pt will verbalize understanding of adaptive strategies to incr ease, safety, and independence with ADLs and IADLs.--check LTGs 10/11/20    Time 8    Period Weeks    Status New      OT LONG TERM GOAL #2   Title Pt will demo at least 115* R shoulder flex with -35* elbow ext for functional reach.    Baseline 100* with -45* elbow ext    Time 8    Period Weeks    Status New      OT LONG TERM GOAL #3   Title Pt will perform UB dressing consistently mod I.    Time 8    Period Weeks    Status New      OT LONG TERM GOAL #4   Title Pt will perform LB dressing consistently mod I.    Time 8    Period Weeks    Status New      OT LONG TERM GOAL #5   Title Pt willPt will improve R hand coordination for ADLs as shown by improving time on 9-hole peg test by at least 10sec.    Baseline 56.84sec    Time 8    Period Weeks    Status New      OT LONG TERM GOAL #6   Title Pt will improved RUE functional reaching and coordination for ADLs as shown by improving score on box and blocks test by at least 5 blocks.    Baseline R-31 blocks, L-40 blocks    Time 8    Period Weeks    Status New                 Plan - 08/25/20 1050    Clinical Impression Statement Pt returns after eye surgery. Therapist spoke with pt's receptionist at MD office, they said no real precautions, however pt's husband told PT that they were told no straining!    OT Occupational Profile and History Detailed Assessment- Review of Records and additional review of  physical, cognitive, psychosocial history related to current functional performance    Occupational performance deficits (Please refer to evaluation for details): ADL's;IADL's;Social Participation;Leisure    Body Structure / Function / Physical Skills ADL;Balance;Coordination;Decreased  knowledge of use of DME;GMC;UE functional use;FMC;Tone;Mobility;Flexibility;IADL;ROM;Dexterity;Body mechanics    Rehab Potential Good    OT Frequency 2x / week    OT Duration 8 weeks    OT Treatment/Interventions Self-care/ADL training;Moist Heat;Aquatic Therapy;Therapeutic activities;Balance training;DME and/or AE instruction;Therapeutic exercise;Passive range of motion;Functional Mobility Training;Neuromuscular education;Cryotherapy;Manual Therapy;Energy conservation;Patient/family education;Ultrasound    Plan gentle activities until clarification received from opthamology    Consulted and Agree with Plan of Care Patient           Patient will benefit from skilled therapeutic intervention in order to improve the following deficits and impairments:   Body Structure / Function / Physical Skills: ADL, Balance, Coordination, Decreased knowledge of use of DME, GMC, UE functional use, FMC, Tone, Mobility, Flexibility, IADL, ROM, Dexterity, Body mechanics       Visit Diagnosis: Other symptoms and signs involving the nervous system  Other lack of coordination  Other symptoms and signs involving the musculoskeletal system  Stiffness of right shoulder, not elsewhere classified  Unsteadiness on feet    Problem List Patient Active Problem List   Diagnosis Date Noted  . Pain due to onychomycosis of toenails of both feet 06/17/2020  . Parkinson's disease (HCC) 04/23/2020  . Leg swelling 11/26/2019  . Chronic venous insufficiency 11/26/2019  . Vitamin B12 deficiency 02/06/2018  . Sepsis secondary to UTI (HCC) 01/29/2018  . Microcytic anemia 01/29/2018  . Renal insufficiency 01/29/2018  . Pure hypercholesterolemia 12/24/2014  . Diabetes mellitus type 2, controlled, without complications (HCC) 12/24/2014  . Bruit of right carotid artery 12/24/2014  . Thyroid disease 07/06/2014  . Severe obesity (BMI >= 40) (HCC) 07/06/2014  . HTN (hypertension) 07/06/2014     Ladislav Caselli 08/25/2020, 12:48 PM  [facility] [address] [place], [place], [address] Phone: [phone]   Fax:  [phone]  Name: Anna Bernard MRN: [id number] Date of Birth: 12-Oct-1950

## 2020-08-25 NOTE — Therapy (Signed)
[facility] [address] [place], [place], [address] Phone: [phone]   Fax:  [phone]  Physical Therapy Treatment/10th Visit Progress Note  Patient Details  Name: Anna Bernard MRN: [id number] Date of Birth: Jun 14, 1951 Referring Provider (PT): Alonza Bogus, DO  Encounter Date: 08/25/2020   PT End of Session - 08/25/20 1116    Visit Number 10    Number of Visits 18    Date for PT Re-Evaluation 91/63/84   90 day cert for 9 wk POC   Authorization Type UHC Medicare    Progress Note Due on Visit 10    PT Start Time 0933    PT Stop Time 1015    PT Time Calculation (min) 42 min    Equipment Utilized During Treatment Gait belt    Activity Tolerance Patient tolerated treatment well    Behavior During Therapy [facility] for tasks assessed/performed;Flat affect   somewhat lethargic          Past Medical History:  Diagnosis Date  . Anemia, unspecified   . Hyperlipidemia, unspecified   . Hypertension   . Hypothyroidism, unspecified   . Pre-diabetes   . Vitamin B12 deficiency     Past Surgical History:  Procedure Laterality Date  . ABDOMINAL HYSTERECTOMY    . GIVENS CAPSULE STUDY N/A 04/16/2018   Procedure: GIVENS CAPSULE STUDY;  Surgeon: Carol Ada, MD;  Location: [place];  Service: Endoscopy;  Laterality: N/A;  . PARS PLANA VITRECTOMY Right 08/16/2020   Procedure: 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;  Surgeon: Jalene Mullet, MD;  Location: [facility];  Service: Ophthalmology;  Laterality: Right;  . PHOTOCOAGULATION WITH LASER Right 08/16/2020   Procedure: PHOTOCOAGULATION WITH LASER;  Surgeon: Jalene Mullet, MD;  Location: [place];  Service: Ophthalmology;  Laterality: Right;  . SHOULDER SURGERY    . TONSILLECTOMY    . TUBAL LIGATION      There were no vitals filed for this visit.   Subjective Assessment - 08/25/20 0940    Subjective Feel like I'm starting over from the  beginning.  I don't know that the Sinemet is helping at all.  Per husband, the doctor said no straining after the eye surgery.    Patient is accompained by: Family member   [name], husband   Pertinent History Dx with Parkinson's disease (Dr. Carles Collet) 04/2020 and Sinemet started; PMH of HTN, DM, hypothyroidism, orthostatic hypotension    Patient Stated Goals Get my walk back, be able to do things again.    Currently in Pain? Yes    Pain Score 5     Pain Location Generalized    Pain Orientation --   all over, arthritis type pain   Pain Descriptors / Indicators Aching    Pain Type Chronic pain    Pain Onset More than a month ago    Pain Frequency Constant    Aggravating Factors  when not taking medicaiton    Pain Relieving Factors medication                             [facility] Adult PT Treatment/Exercise - 08/25/20 0001      Transfers   Transfers Sit to Stand;Stand to Sit    Sit to Stand 5: Supervision;With upper extremity assist;From bed;From chair/3-in-1    Sit to Stand Details Verbal cues for sequencing;Verbal cues for technique    Sit to Stand Details (indicate cue type  and reason) Cues for hand placement    Stand to Sit 5: Supervision;To chair/3-in-1;With upper extremity assist    Stand to Sit Details (indicate cue type and reason) Verbal cues for sequencing;Verbal cues for technique      Ambulation/Gait   Ambulation/Gait Yes    Ambulation/Gait Assistance 5: Supervision    Ambulation Distance (Feet) 230 Feet   then 225   Assistive device Rollator    Gait Pattern Step-through pattern;Decreased step length - right;Decreased step length - left;Decreased stance time - right;Decreased stance time - left;Decreased dorsiflexion - right;Decreased dorsiflexion - left;Shuffle;Poor foot clearance - left;Poor foot clearance - right    Ambulation Surface Level;Indoor    Gait Comments 2 minute walk:  205 ft.  PT provides cues to keep rollator close and to take big, deliberate steps.   Otherwise, pt takes small, short, fast shuffling steps.      Timed Up and Go Test   TUG Normal TUG    Normal TUG (seconds) 35.78      Self-Care   Self-Care Other Self-Care Comments    Other Self-Care Comments  Discussed and provided fall prevention education for home environment.  Disucssed benefits of best posture, keeping rollator close, and taking big deliberate steps for better balance and for more efficient gait.  Discussed pt's recent eye surgery (retinal detachment) and f/u with MD, who stated no straining (PT will follow up for more detailed instructions).  Instructed pt at this time to NOT perform her current HEP, except for walking 2-3 minutes, 3 times per day, using rollator at home.                  PT Education - 08/25/20 1115    Education Details Fall prevention-see instructions; also requested pt stop performing HEP except walking with rollator until further clarifcation from eye doctor post surgery    Person(s) Educated Patient    Methods Explanation;Handout    Comprehension Verbalized understanding            PT Short Term Goals - 08/25/20 1120      PT SHORT TERM GOAL #1   Title Pt will perform HEP with family supervision, for improved balance, stregnth, transfers, and gait.  TARGET 08/14/2020    Baseline Not currently performing HEP, due to unplanned eye surgery    Time 5    Period Weeks    Status Not Met      PT SHORT TERM GOAL #2   Title Pt will improve 5x sit<>stand to less than or equal to 20 seconds for improved transfer efficiency and functional strength.    Baseline 08/10/20-23.66 seconds    Time 5    Period Weeks    Status Not Met      PT SHORT TERM GOAL #3   Title Berg score to be assessed, with Berg score improved by at least 5 points for decreased fall risk.    Baseline 28/56 on 08/10/20    Time 5    Period Weeks    Status Achieved      PT SHORT TERM GOAL #4   Title Pt will improve TUG score to less than or equal to 20 seconds for  decreased fall risk.    Baseline time increased to 34.72 on 08/10/20   performed at end of session and pt reports fatigue and unwilling to perform 2nd attempts   Time 5    Period Weeks    Status Not Met      PT SHORT  TERM GOAL #5   Title Pt/husband will verbalize understanding of fall prevention in home environment.    Time 5    Period Weeks    Status Achieved             PT Long Term Goals - 07/13/20 2030      PT LONG TERM GOAL #1   Title Pt will perform progression of HEP, including verbalizing plans for optimal fitness plan upon d/c from PT.  TARGET 09/11/2020    Time 9    Period Weeks    Status New      PT LONG TERM GOAL #2   Title Pt will improve 5x sit<>stand in less than or equal to 15 seconds for improved transfer efficiency and functional strength.    Time 9    Period Weeks    Status New      PT LONG TERM GOAL #3   Title Pt will improve gait velocity to at least 1.8 ft/sec for improved gait efficiency and safety.    Baseline 1.27 ft/sec    Time 9    Period Weeks    Status New      PT LONG TERM GOAL #4   Title Pt will improve TUG score to less than or equal to 15 seconds for decreased fall risk.    Time 9    Period Weeks    Status New      PT LONG TERM GOAL #5   Title Pt will ambulate at least 215 ft in 2 minute walk test, for improved gait efficiency and safety.    Baseline 175 ft    Time 9    Period Weeks    Status New      Additional Long Term Goals   Additional Long Term Goals Yes      PT LONG TERM GOAL #6   Title Pt will verbalize understanding of local Parkinson's disease resources.    Time 9    Period Weeks    Status New                 Plan - 08/25/20 1121    Clinical Impression Statement 10th Visit Progress Note, covering dates 07/13/2020-08/25/2020.  Pt reports feeling today like she is starting over, and continues to have difficulty wtih picking R foot up from the floor.  Pt has not been seen since 08/10/2020 visit, due to ED  visit requiring surgery R eye for retinal detachment.  She returns today with no precautions other than avoiding straining activities.  Objective measures:  5x sit<>stand 23.66 sec, Berg 28/56 improved from 23/56.  TUG score slowed from eval, 35.78 sec.  STG 1 not met, as pt has not been performing HEP since eye surgery (and PT asks pt to hold on HEP until further clarification from MD on "striaining").  STG 5 met for fall prevention, and Berg goal met.  Pt is making slow, steady progress in some areas, other areas slowed from being out from eye surgery; however, pt c/o overall fatigue and generalized pain throughout sessions.  She remains at significant fall risk and will benefit from skilled PT to further address balance, strength, and gait for improved overall functional mobility and decreased fall risk.    Personal Factors and Comorbidities Comorbidity 3+    Comorbidities PMH:  HTN, DM, hypothyroidism, orthostatic hypotension    Examination-Activity Limitations Locomotion Level;Stand;Transfers    Examination-Participation Restrictions Community Activity;Other   Plans for travel in retirement   Stability/Clinical Decision  Making Evolving/Moderate complexity    Rehab Potential Good    PT Frequency 2x / week    PT Duration Other (comment)   9 weeks   PT Treatment/Interventions ADLs/Self Care Home Management;DME Instruction;Neuromuscular re-education;Balance training;Therapeutic exercise;Therapeutic activities;Functional mobility training;Gait training;Patient/family education;Manual techniques    PT Next Visit Plan Call back to MD office for activity precautions (OT has called and no restrictions/pt reports no straining).  Continue gait training, standing weight shifting, exercises to work on single limb stance, foot clearance/step length, try SciFit again for warm-up    Consulted and Agree with Plan of Care Patient           Patient will benefit from skilled therapeutic intervention in order to  improve the following deficits and impairments:  Abnormal gait, Difficulty walking, Decreased balance, Decreased mobility, Decreased strength, Postural dysfunction  Visit Diagnosis: Other abnormalities of gait and mobility  Unsteadiness on feet     Problem List Patient Active Problem List   Diagnosis Date Noted  . Pain due to onychomycosis of toenails of both feet 06/17/2020  . Parkinson's disease ([facility]) 04/23/2020  . Leg swelling 11/26/2019  . Chronic venous insufficiency 11/26/2019  . Vitamin B12 deficiency 02/06/2018  . Sepsis secondary to UTI ([facility]) 01/29/2018  . Microcytic anemia 01/29/2018  . Renal insufficiency 01/29/2018  . Pure hypercholesterolemia 12/24/2014  . Diabetes mellitus type 2, controlled, without complications ([facility]) 25/95/6387  . Bruit of right carotid artery 12/24/2014  . Thyroid disease 07/06/2014  . Severe obesity (BMI >= 40) ([place]) 07/06/2014  . HTN (hypertension) 07/06/2014    Melia Hopes W. 08/25/2020, 11:27 AM  Frazier Butt., PT   [facility] [address] [place], [place], [address] Phone: [phone]   Fax:  [phone]  Name: Anna Bernard MRN: [id number] Date of Birth: [birth year]

## 2020-08-25 NOTE — Patient Instructions (Signed)

## 2020-08-28 ENCOUNTER — Ambulatory Visit: Payer: Medicare Other | Admitting: Occupational Therapy

## 2020-08-28 ENCOUNTER — Encounter: Payer: Self-pay | Admitting: Occupational Therapy

## 2020-08-28 ENCOUNTER — Other Ambulatory Visit: Payer: Self-pay

## 2020-08-28 ENCOUNTER — Ambulatory Visit: Payer: Medicare Other | Admitting: Physical Therapy

## 2020-08-28 DIAGNOSIS — R278 Other lack of coordination: Secondary | ICD-10-CM

## 2020-08-28 DIAGNOSIS — M25621 Stiffness of right elbow, not elsewhere classified: Secondary | ICD-10-CM

## 2020-08-28 DIAGNOSIS — R29898 Other symptoms and signs involving the musculoskeletal system: Secondary | ICD-10-CM | POA: Diagnosis not present

## 2020-08-28 DIAGNOSIS — M6281 Muscle weakness (generalized): Secondary | ICD-10-CM

## 2020-08-28 DIAGNOSIS — R2681 Unsteadiness on feet: Secondary | ICD-10-CM | POA: Diagnosis not present

## 2020-08-28 DIAGNOSIS — R2689 Other abnormalities of gait and mobility: Secondary | ICD-10-CM | POA: Diagnosis not present

## 2020-08-28 DIAGNOSIS — R29818 Other symptoms and signs involving the nervous system: Secondary | ICD-10-CM | POA: Diagnosis not present

## 2020-08-28 DIAGNOSIS — M25611 Stiffness of right shoulder, not elsewhere classified: Secondary | ICD-10-CM | POA: Diagnosis not present

## 2020-08-28 DIAGNOSIS — R293 Abnormal posture: Secondary | ICD-10-CM | POA: Diagnosis not present

## 2020-08-28 NOTE — Patient Instructions (Addendum)
Access Code: [email or web address] URL: [email or web address] Date: 08/28/2020 Prepared by: Lonia Blood  Exercises Seated Ankle Dorsiflexion AROM - 2-3 x daily - 5 x weekly - 2 sets - 10 reps Seated Heel Raise - 2-3 x daily - 5 x weekly - 2 sets - 10 reps Seated Ankle Circles - 2-3 x daily - 5 x weekly - 1 sets - 5-10 reps   Also verbally instructed rolling foot forward/back using pool noodle or water bottle

## 2020-08-28 NOTE — Therapy (Addendum)
[facility] [address] [place], [place], [address] Phone: [phone]   Fax:  [phone]  Physical Therapy Treatment  Patient Details  Name: Anna Bernard MRN: [id number] Date of Birth: Aug 24, 1951 Referring Provider (PT): Alonza Bogus, DO   Encounter Date: 08/28/2020   PT End of Session - 08/28/20 1112    Visit Number 11    Number of Visits 18    Date for PT Re-Evaluation 61/22/44   90 day cert for 9 wk POC   Authorization Type U[employer] Medicare    Progress Note Due on Visit 20    PT Start Time 1019    PT Stop Time 1100    PT Time Calculation (min) 41 min    Equipment Utilized During Treatment Gait belt    Activity Tolerance Patient tolerated treatment well    Behavior During Therapy [facility] for tasks assessed/performed;Flat affect   somewhat lethargic          Past Medical History:  Diagnosis Date  . Anemia, unspecified   . Hyperlipidemia, unspecified   . Hypertension   . Hypothyroidism, unspecified   . Pre-diabetes   . Vitamin B12 deficiency     Past Surgical History:  Procedure Laterality Date  . ABDOMINAL HYSTERECTOMY    . GIVENS CAPSULE STUDY N/A 04/16/2018   Procedure: GIVENS CAPSULE STUDY;  Surgeon: Carol Ada, MD;  Location: [place];  Service: Endoscopy;  Laterality: N/A;  . PARS PLANA VITRECTOMY Right 08/16/2020   Procedure: 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;  Surgeon: Jalene Mullet, MD;  Location: [facility];  Service: Ophthalmology;  Laterality: Right;  . PHOTOCOAGULATION WITH LASER Right 08/16/2020   Procedure: PHOTOCOAGULATION WITH LASER;  Surgeon: Jalene Mullet, MD;  Location: [facility];  Service: Ophthalmology;  Laterality: Right;  . SHOULDER SURGERY    . TONSILLECTOMY    . TUBAL LIGATION      There were no vitals filed for this visit.   Subjective Assessment - 08/28/20 1021    Subjective No changes since last visit.    Patient is accompained by:  Family member   Anna Bernard, husband   Pertinent History Dx with Parkinson's disease (Dr. Carles Collet) 04/2020 and Sinemet started; PMH of HTN, DM, hypothyroidism, orthostatic hypotension    Patient Stated Goals Get my walk back, be able to do things again.    Currently in Pain? Yes    Pain Score 5     Pain Location Foot    Pain Orientation Right    Pain Descriptors / Indicators Aching    Pain Type Chronic pain    Pain Onset More than a month ago    Pain Frequency Constant    Aggravating Factors  walking    Pain Relieving Factors medicine                             [facility] Adult PT Treatment/Exercise - 08/28/20 0001      Transfers   Transfers Sit to Stand;Stand to Sit    Sit to Stand 5: Supervision;With upper extremity assist;From bed;From chair/3-in-1    Sit to Stand Details Verbal cues for sequencing;Verbal cues for technique    Stand to Sit 5: Supervision;To chair/3-in-1;With upper extremity assist    Stand to Sit Details (indicate cue type and reason) Verbal cues for sequencing;Verbal cues for technique    Comments Cues for ease of scooting away from table, as pt is trying  to grab to table to push.  Cues for hands on armrests of chair, lean forward to lift buttocks from chair and push chair away.  Repeated x 3 to scoot far enough away from table.  Cues in standing to rock side to side for improved ease of initiating movement.  Performed 2 sets x 5 reps in standing.      Ambulation/Gait   Ambulation/Gait Yes    Ambulation/Gait Assistance 5: Supervision    Ambulation/Gait Assistance Details Each rep of gait, cues x 1 to stop, reset posture and start again with increased step length.    Ambulation Distance (Feet) 250 Feet   180, 120   Assistive device Rollator    Gait Pattern Step-through pattern;Decreased step length - right;Decreased step length - left;Decreased stance time - right;Decreased stance time - left;Decreased dorsiflexion - right;Decreased dorsiflexion -  left;Shuffle;Poor foot clearance - left;Poor foot clearance - right    Ambulation Surface Level;Indoor      Exercises   Exercises Ankle      Knee/Hip Exercises: Aerobic   Stepper Seated SciFit Stepper, Level 1.2, 4 extremities, x 4 minutes, for flexibility and strength at end of session.      Ankle Exercises: Seated   Ankle Circles/Pumps AROM;Right;Left;5 reps   2 sets clockwise and counterclockwise   Heel Raises Both;10 reps   2 sets   Toe Raise 10 reps   2 sets   Other Seated Ankle Exercises Additional 10 reps ankle pumps in LAQ position each leg.    Other Seated Ankle Exercises Feet on a pool noodle-rolling forward/back for active-assisted ankle motion, x 10 reps (verbally gave this to patient and educated she can use pool noodle or water bottle for rolling).                  PT Education - 08/28/20 1112    Education Details Updates to HEP-see instructions    Person(s) Educated Patient    Methods Explanation;Demonstration;Handout    Comprehension Verbalized understanding;Returned demonstration;Verbal cues required            PT Short Term Goals - 08/25/20 1120      PT SHORT TERM GOAL #1   Title Pt will perform HEP with family supervision, for improved balance, stregnth, transfers, and gait.  TARGET 08/14/2020    Baseline Not currently performing HEP, due to unplanned eye surgery    Time 5    Period Weeks    Status Not Met      PT SHORT TERM GOAL #2   Title Pt will improve 5x sit<>stand to less than or equal to 20 seconds for improved transfer efficiency and functional strength.    Baseline 08/10/20-23.66 seconds    Time 5    Period Weeks    Status Not Met      PT SHORT TERM GOAL #3   Title Berg score to be assessed, with Berg score improved by at least 5 points for decreased fall risk.    Baseline 28/56 on 08/10/20    Time 5    Period Weeks    Status Achieved      PT SHORT TERM GOAL #4   Title Pt will improve TUG score to less than or equal to 20 seconds  for decreased fall risk.    Baseline time increased to 34.72 on 08/10/20   performed at end of session and pt reports fatigue and unwilling to perform 2nd attempts   Time 5    Period Weeks  Status Not Met      PT SHORT TERM GOAL #5   Title Pt/husband will verbalize understanding of fall prevention in home environment.    Time 5    Period Weeks    Status Achieved             PT Long Term Goals - 07/13/20 2030      PT LONG TERM GOAL #1   Title Pt will perform progression of HEP, including verbalizing plans for optimal fitness plan upon d/c from PT.  TARGET 09/11/2020    Time 9    Period Weeks    Status New      PT LONG TERM GOAL #2   Title Pt will improve 5x sit<>stand in less than or equal to 15 seconds for improved transfer efficiency and functional strength.    Time 9    Period Weeks    Status New      PT LONG TERM GOAL #3   Title Pt will improve gait velocity to at least 1.8 ft/sec for improved gait efficiency and safety.    Baseline 1.27 ft/sec    Time 9    Period Weeks    Status New      PT LONG TERM GOAL #4   Title Pt will improve TUG score to less than or equal to 15 seconds for decreased fall risk.    Time 9    Period Weeks    Status New      PT LONG TERM GOAL #5   Title Pt will ambulate at least 215 ft in 2 minute walk test, for improved gait efficiency and safety.    Baseline 175 ft    Time 9    Period Weeks    Status New      Additional Long Term Goals   Additional Long Term Goals Yes      PT LONG TERM GOAL #6   Title Pt will verbalize understanding of local Parkinson's disease resources.    Time 9    Period Weeks    Status New                 Plan - 08/28/20 1113    Clinical Impression Statement Skilled session today focused on gait training, with 3 bouts of gait, needing initial cues for increased weightshifting and step length, then exercises to address pt's R ankle/foot pain.  Discussed using ankle exercises, "motion as lotion",  with options for gentle ankle pumps and exercises that pt can perform through the day.  Pt reports less pain at end of session today and PT educated pt on importance of exercise as way to feel better, move better, and reduce her pain.    Personal Factors and Comorbidities Comorbidity 3+    Comorbidities PMH:  HTN, DM, hypothyroidism, orthostatic hypotension    Examination-Activity Limitations Locomotion Level;Stand;Transfers    Examination-Participation Restrictions Community Activity;Other   Plans for travel in retirement   Stability/Clinical Decision Making Evolving/Moderate complexity    Rehab Potential Good    PT Frequency 2x / week    PT Duration Other (comment)   9 weeks   PT Treatment/Interventions ADLs/Self Care Home Management;DME Instruction;Neuromuscular re-education;Balance training;Therapeutic exercise;Therapeutic activities;Functional mobility training;Gait training;Patient/family education;Manual techniques    PT Next Visit Plan Call back to MD office for activity precautions (OT has called and no restrictions/pt reports no straining).  Husband may be present next session for education; review updates to HEP.  Continue gait training, standing weigthshfiting (especially to  initiate gait), and exercises to work on single limb stance and step length/foot clearance.    Consulted and Agree with Plan of Care Patient           Patient will benefit from skilled therapeutic intervention in order to improve the following deficits and impairments:  Abnormal gait,Difficulty walking,Decreased balance,Decreased mobility,Decreased strength,Postural dysfunction  Visit Diagnosis: Other abnormalities of gait and mobility  Muscle weakness (generalized)     Problem List Patient Active Problem List   Diagnosis Date Noted  . Pain due to onychomycosis of toenails of both feet 06/17/2020  . Parkinson's disease ([facility]) 04/23/2020  . Leg swelling 11/26/2019  . Chronic venous insufficiency  11/26/2019  . Vitamin B12 deficiency 02/06/2018  . Sepsis secondary to UTI ([place]) 01/29/2018  . Microcytic anemia 01/29/2018  . Renal insufficiency 01/29/2018  . Pure hypercholesterolemia 12/24/2014  . Diabetes mellitus type 2, controlled, without complications ([facility]) 87/86/7672  . Bruit of right carotid artery 12/24/2014  . Thyroid disease 07/06/2014  . Severe obesity (BMI >= 40) ([facility]) 07/06/2014  . HTN (hypertension) 07/06/2014    Sopheap Boehle W. 08/28/2020, 11:27 AM  Frazier Butt., PT   [facility] [address] [place], [place], [address] Phone: [phone]   Fax:  [phone]  Name: Anna Bernard MRN: [id number] Date of Birth: 1951-04-05

## 2020-08-28 NOTE — Therapy (Signed)
[facility] [address] [place], [place], [address] Phone: [phone]   Fax:  [phone]  Occupational Therapy Treatment  Patient Details  Name: Anna Bernard MRN: [id number] Date of Birth: February 18, 1951 Referring Provider (OT): Dr. Lurena Joiner Tat   Encounter Date: 08/28/2020   OT End of Session - 08/28/20 1456    Visit Number 6    Number of Visits 17    Date for OT Re-Evaluation 09/11/20    Authorization Type Medicare UHC:  no visit limit or auth required    Authorization Time Period cert. date 07/13/20-10/11/20    Authorization - Visit Number 6    Authorization - Number of Visits 10    OT Start Time 0935    OT Stop Time 1015    OT Time Calculation (min) 40 min    Activity Tolerance Patient tolerated treatment well    Behavior During Therapy [facility] for tasks assessed/performed           Past Medical History:  Diagnosis Date  . Anemia, unspecified   . Hyperlipidemia, unspecified   . Hypertension   . Hypothyroidism, unspecified   . Pre-diabetes   . Vitamin B12 deficiency     Past Surgical History:  Procedure Laterality Date  . ABDOMINAL HYSTERECTOMY    . GIVENS CAPSULE STUDY N/A 04/16/2018   Procedure: GIVENS CAPSULE STUDY;  Surgeon: Jeani Hawking, MD;  Location: [facility] END[facility]COPY;  Service: Endoscopy;  Laterality: N/A;  . PARS PLANA VITRECTOMY Right 08/16/2020   Procedure: 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;  Surgeon: Carmela Rima, MD;  Location: [facility] OR;  Service: Ophthalmology;  Laterality: Right;  . PHOTOCOAGULATION WITH LASER Right 08/16/2020   Procedure: PHOTOCOAGULATION WITH LASER;  Surgeon: Carmela Rima, MD;  [facility];  Service: Ophthalmology;  Laterality: Right;  . SHOULDER SURGERY    . TONSILLECTOMY    . TUBAL LIGATION      There were no vitals filed for this visit.   Subjective Assessment - 08/28/20 0941    Subjective  Pt reports she has been doing  coordination activities at home    Pertinent History newly diagnosed PD (in last month).   PMH:  anemia, hyperlipidemia, HTN, hypothyroidism, Vitamin B12 deficiency, hx of R RTC surgery, leg swelling, arthritis (per pt), obesity, orthostatic hypotension    Patient Stated Goals be able to move better and travel (to [place] first)    Currently in Pain? Yes    Pain Score 5     Pain Location Foot    Pain Orientation Right    Pain Descriptors / Indicators Aching    Pain Type Chronic pain    Pain Onset More than a month ago    Pain Frequency Constant    Aggravating Factors  walking    Pain Relieving Factors cream    Multiple Pain Sites No                                OT Treatment/ Education - 08/28/20 1012    Education Details Education provided regarding Handwriting strategies, reviewed and issued handout ,  Pt practiced tracing items  then writing continuous "l", then writing sentences with min v.c for letter size and techniques, increased time required,  Flipping playing playing cards  with larger amplitude movements, min v.c for performance, increased time with all activities due to bradykinesia  OT Short Term Goals - 08/28/20 0944      OT SHORT TERM GOAL #1   Title Pt will be independent with PD-specific HEP--check STGs 09/11/20    Time 4    Period Weeks    Status New      OT SHORT TERM GOAL #2   Title Pt will demo at least 110* R shoulder flex with -40* elbow ext for functional reach.    Baseline 100* with -45* elbow ext    Time 4    Period Weeks    Status New      OT SHORT TERM GOAL #3   Title Pt will improve R hand coordination for ADLs as shown by improving time on 9-hole peg test by at least 6sec.    Baseline 56.84sec    Time 4    Period Weeks    Status New      OT SHORT TERM GOAL #4   Title Pt willdemonstrate improved ease with dressing as evidenced by  performing  PPT#4 donning/ doffing jacket in 1 min 15 secs    Baseline 1 min 29  secs    Time 4    Period Weeks    Status New      OT SHORT TERM GOAL #5   Title Pt will verbalize understanding of appropriate community resources and ways to decr risk of future complications.    Time 4    Period Weeks    Status On-going             OT Long Term Goals - 07/13/20 2158      OT LONG TERM GOAL #1   Title Pt will verbalize understanding of adaptive strategies to incr ease, safety, and independence with ADLs and IADLs.--check LTGs 10/11/20    Time 8    Period Weeks    Status New      OT LONG TERM GOAL #2   Title Pt will demo at least 115* R shoulder flex with -35* elbow ext for functional reach.    Baseline 100* with -45* elbow ext    Time 8    Period Weeks    Status New      OT LONG TERM GOAL #3   Title Pt will perform UB dressing consistently mod I.    Time 8    Period Weeks    Status New      OT LONG TERM GOAL #4   Title Pt will perform LB dressing consistently mod I.    Time 8    Period Weeks    Status New      OT LONG TERM GOAL #5   Title Pt willPt will improve R hand coordination for ADLs as shown by improving time on 9-hole peg test by at least 10sec.    Baseline 56.84sec    Time 8    Period Weeks    Status New      OT LONG TERM GOAL #6   Title Pt will improved RUE functional reaching and coordination for ADLs as shown by improving score on box and blocks test by at least 5 blocks.    Baseline R-31 blocks, L-40 blocks    Time 8    Period Weeks    Status New                 Plan - 08/28/20 1007    Clinical Impression Statement Therapist continued low impact activities today. Await further clarification of precautions for activity.  OT Occupational Profile and History Detailed Assessment- Review of Records and additional review of physical, cognitive, psychosocial history related to current functional performance    Occupational performance deficits (Please refer to evaluation for details): ADL's;IADL's;Social  Participation;Leisure    Body Structure / Function / Physical Skills ADL;Balance;Coordination;Decreased knowledge of use of DME;GMC;UE functional use;FMC;Tone;Mobility;Flexibility;IADL;ROM;Dexterity;Body mechanics    Rehab Potential Good    OT Frequency 2x / week    OT Duration 8 weeks    OT Treatment/Interventions Self-care/ADL training;Moist Heat;Aquatic Therapy;Therapeutic activities;Balance training;DME and/or AE instruction;Therapeutic exercise;Passive range of motion;Functional Mobility Training;Neuromuscular education;Cryotherapy;Manual Therapy;Energy conservation;Patient/family education;Ultrasound    Plan gentle activities until clarification received from opthamology, consider ADL strategies, coordination tasks    Consulted and Agree with Plan of Care Patient           Patient will benefit from skilled therapeutic intervention in order to improve the following deficits and impairments:   Body Structure / Function / Physical Skills: ADL,Balance,Coordination,Decreased knowledge of use of DME,GMC,UE functional use,FMC,Tone,Mobility,Flexibility,IADL,ROM,Dexterity,Body mechanics       Visit Diagnosis: Other lack of coordination  Other symptoms and signs involving the musculoskeletal system  Stiffness of right shoulder, not elsewhere classified  Stiffness of right elbow, not elsewhere classified  Other symptoms and signs involving the nervous system  Muscle weakness (generalized)    Problem List Patient Active Problem List   Diagnosis Date Noted  . Pain due to onychomycosis of toenails of both feet 06/17/2020  . Parkinson's disease ([facility]) 04/23/2020  . Leg swelling 11/26/2019  . Chronic venous insufficiency 11/26/2019  . Vitamin B12 deficiency 02/06/2018  . Sepsis secondary to UTI ([facility]) 01/29/2018  . Microcytic anemia 01/29/2018  . Renal insufficiency 01/29/2018  . Pure hypercholesterolemia 12/24/2014  . Diabetes mellitus type 2, controlled, without complications  ([facility]) 12/24/2014  . Bruit of right carotid artery 12/24/2014  . Thyroid disease 07/06/2014  . Severe obesity (BMI >= 40) ([facility]) 07/06/2014  . HTN (hypertension) 07/06/2014    Anna Bernard 08/28/2020, 2:57 PM  [facility] [address] [place], [place], [address] Phone: [phone]   Fax:  [phone]  Name: Anna Bernard MRN: [id number] Date of Birth: 06-Jul-1951

## 2020-08-31 ENCOUNTER — Telehealth: Payer: Self-pay | Admitting: Physical Therapy

## 2020-08-31 NOTE — Telephone Encounter (Signed)
PT contacted Dr. Eliane Decree office [facility]) and asked to clarify any restrictions/precautions patient may have after surgery.  Receptionist checked with Dr. Allena Katz and states no precautions from Dr. Allena Katz at this time.  Lonia Blood, PT 08/31/20 3:43 PM Phone: [phone] Fax: [phone]

## 2020-09-01 ENCOUNTER — Ambulatory Visit: Payer: Medicare Other | Admitting: Occupational Therapy

## 2020-09-01 ENCOUNTER — Other Ambulatory Visit: Payer: Self-pay

## 2020-09-01 ENCOUNTER — Ambulatory Visit: Payer: Medicare Other | Admitting: Physical Therapy

## 2020-09-01 DIAGNOSIS — R2681 Unsteadiness on feet: Secondary | ICD-10-CM

## 2020-09-01 DIAGNOSIS — R293 Abnormal posture: Secondary | ICD-10-CM

## 2020-09-01 DIAGNOSIS — R2689 Other abnormalities of gait and mobility: Secondary | ICD-10-CM

## 2020-09-01 DIAGNOSIS — R29818 Other symptoms and signs involving the nervous system: Secondary | ICD-10-CM | POA: Diagnosis not present

## 2020-09-01 DIAGNOSIS — M6281 Muscle weakness (generalized): Secondary | ICD-10-CM

## 2020-09-01 DIAGNOSIS — R278 Other lack of coordination: Secondary | ICD-10-CM | POA: Diagnosis not present

## 2020-09-01 DIAGNOSIS — M25621 Stiffness of right elbow, not elsewhere classified: Secondary | ICD-10-CM | POA: Diagnosis not present

## 2020-09-01 DIAGNOSIS — R29898 Other symptoms and signs involving the musculoskeletal system: Secondary | ICD-10-CM | POA: Diagnosis not present

## 2020-09-01 DIAGNOSIS — M25611 Stiffness of right shoulder, not elsewhere classified: Secondary | ICD-10-CM

## 2020-09-01 NOTE — Therapy (Signed)
[facility] [address] [place], [place], [address] Phone: [phone]   Fax:  [phone]  Occupational Therapy Treatment  Patient Details  Name: Anna Bernard MRN: [id number] Date of Birth: April 12, 1951 Referring Provider (OT): Dr. Lurena Joiner Tat   Encounter Date: 09/01/2020   OT End of Session - 09/01/20 1030    Visit Number 7    Number of Visits 17    Date for OT Re-Evaluation 09/11/20    Authorization Type Medicare UHC:  no visit limit or auth required    Authorization Time Period cert. date 07/13/20-10/11/20    Authorization - Visit Number 7    Authorization - Number of Visits 10    OT Start Time 1024    OT Stop Time 1102    OT Time Calculation (min) 38 min    Activity Tolerance Patient tolerated treatment well    Behavior During Therapy WFL for tasks assessed/performed           Past Medical History:  Diagnosis Date  . Anemia, unspecified   . Hyperlipidemia, unspecified   . Hypertension   . Hypothyroidism, unspecified   . Pre-diabetes   . Vitamin B12 deficiency     Past Surgical History:  Procedure Laterality Date  . ABDOMINAL HYSTERECTOMY    . GIVENS CAPSULE STUDY N/A 04/16/2018   Procedure: GIVENS CAPSULE STUDY;  Surgeon: Jeani Hawking, MD;  Location: [facility] ENDOSCOPY;  Service: Endoscopy;  Laterality: N/A;  . 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 PHOTOCOAGULATION WITH LASER Right 08/16/2020   Procedure: PHOTOCOAGULATION WITH LASER;  Surgeon: Carmela Rima, MD;  Location: [facility] OR;  Service: Ophthalmology;  Laterality: Right;  . SHOULDER SURGERY    . TONSILLECTOMY    . TUBAL LIGATION      There were no vitals filed for this visit.   Subjective Assessment - 09/01/20 1024    Subjective  had surgery for detatched retina  R eye, (no current restrictions--see telephone note)    Pertinent History newly diagnosed PD (in last month).   PMH:  anemia, hyperlipidemia, HTN, hypothyroidism, Vitamin B12 deficiency, hx of R RTC surgery, leg swelling, arthritis (per pt), obesity, orthostatic hypotension    Patient Stated Goals be able to move better and travel (to [place] first)    Currently in Pain? Yes    Pain Score 2     Pain Location Foot    Pain Orientation Right    Pain Descriptors / Indicators Aching    Pain Onset More than a month ago    Pain Frequency Constant    Aggravating Factors  walking    Pain Relieving Factors medicine               PWR! Moves (basic 4) in supine x 10-20 each with min-mod cues For incr movement amplitude.              OT Education - 09/01/20 1425    Education Details Bag HEP for simulated ADLs/application for ADL strategies--see pt instructions.    Person(s) Educated Patient    Methods Explanation;Demonstration;Verbal cues;Handout    Comprehension Verbalized understanding;Returned demonstration;Verbal cues required;Need further instruction            OT Short Term Goals - 09/01/20 1055      OT SHORT TERM GOAL #1   Title Pt  will be independent with PD-specific HEP--check STGs 09/11/20    Time 4    Period Weeks    Status On-going   09/01/20:  needs cueing/reinforcement     OT SHORT TERM GOAL #2   Title Pt will demo at least 110* R shoulder flex with -40* elbow ext for functional reach.    Baseline 100* with -45* elbow ext    Time 4    Period Weeks    Status Achieved   09/01/20:  110* with -30* elbow ext     OT SHORT TERM GOAL #3   Title Pt will improve R hand coordination for ADLs as shown by improving time on 9-hole peg test by at least 6sec.    Baseline 56.84sec    Time 4    Period Weeks    Status New      OT SHORT TERM GOAL #4   Title Pt willdemonstrate improved ease with dressing as evidenced by  performing  PPT#4 donning/ doffing jacket in 1 min 15  secs    Baseline 1 min 29 secs    Time 4    Period Weeks    Status New      OT SHORT TERM GOAL #5   Title Pt will verbalize understanding of appropriate community resources and ways to decr risk of future complications.    Time 4    Period Weeks    Status New             OT Long Term Goals - 07/13/20 2158      OT LONG TERM GOAL #1   Title Pt will verbalize understanding of adaptive strategies to incr ease, safety, and independence with ADLs and IADLs.--check LTGs 10/11/20    Time 8    Period Weeks    Status New      OT LONG TERM GOAL #2   Title Pt will demo at least 115* R shoulder flex with -35* elbow ext for functional reach.    Baseline 100* with -45* elbow ext    Time 8    Period Weeks    Status New      OT LONG TERM GOAL #3   Title Pt will perform UB dressing consistently mod I.    Time 8    Period Weeks    Status New      OT LONG TERM GOAL #4   Title Pt will perform LB dressing consistently mod I.    Time 8    Period Weeks    Status New      OT LONG TERM GOAL #5   Title Pt willPt will improve R hand coordination for ADLs as shown by improving time on 9-hole peg test by at least 10sec.    Baseline 56.84sec    Time 8    Period Weeks    Status New      OT LONG TERM GOAL #6   Title Pt will improved RUE functional reaching and coordination for ADLs as shown by improving score on box and blocks test by at least 5 blocks.    Baseline R-31 blocks, L-40 blocks    Time 8    Period Weeks    Status New                 Plan - 09/01/20 1036    Clinical Impression Statement Pt progressing slowly towards goals, but demo improved R shoulder/elbow ROM with decr rigidity today.    OT Occupational Profile and  History Detailed Assessment- Review of Records and additional review of physical, cognitive, psychosocial history related to current functional performance    Occupational performance deficits (Please refer to evaluation for details): ADL's;IADL's;Social  Participation;Leisure    Body Structure / Function / Physical Skills ADL;Balance;Coordination;Decreased knowledge of use of DME;GMC;UE functional use;FMC;Tone;Mobility;Flexibility;IADL;ROM;Dexterity;Body mechanics    Rehab Potential Good    OT Frequency 2x / week    OT Duration 8 weeks    OT Treatment/Interventions Self-care/ADL training;Moist Heat;Aquatic Therapy;Therapeutic activities;Balance training;DME and/or AE instruction;Therapeutic exercise;Passive range of motion;Functional Mobility Training;Neuromuscular education;Cryotherapy;Manual Therapy;Energy conservation;Patient/family education;Ultrasound    Plan clarification received from opthamology--no restrictions, review bag HEP, ADL strategies/large amplitude movement strategies    Consulted and Agree with Plan of Care Patient           Patient will benefit from skilled therapeutic intervention in order to improve the following deficits and impairments:   Body Structure / Function / Physical Skills: ADL,Balance,Coordination,Decreased knowledge of use of DME,GMC,UE functional use,FMC,Tone,Mobility,Flexibility,IADL,ROM,Dexterity,Body mechanics       Visit Diagnosis: Other symptoms and signs involving the musculoskeletal system  Other symptoms and signs involving the nervous system  Stiffness of right elbow, not elsewhere classified  Stiffness of right shoulder, not elsewhere classified  Other lack of coordination  Muscle weakness (generalized)  Abnormal posture  Unsteadiness on feet  Other abnormalities of gait and mobility    Problem List Patient Active Problem List   Diagnosis Date Noted  . Pain due to onychomycosis of toenails of both feet 06/17/2020  . Parkinson's disease (HCC) 04/23/2020  . Leg swelling 11/26/2019  . Chronic venous insufficiency 11/26/2019  . Vitamin B12 deficiency 02/06/2018  . Sepsis secondary to UTI (HCC) 01/29/2018  . Microcytic anemia 01/29/2018  . Renal insufficiency 01/29/2018  .  Pure hypercholesterolemia 12/24/2014  . Diabetes mellitus type 2, controlled, without complications (HCC) 12/24/2014  . Bruit of right carotid artery 12/24/2014  . Thyroid disease 07/06/2014  . Severe obesity (BMI >= 40) (HCC) 07/06/2014  . HTN (hypertension) 07/06/2014    [facility] 09/01/2020, 2:26 PM  [facility] [address] [place], [place], [address] Phone: [phone]   Fax:  [phone]  Name: KANDISS IHRIG MRN: [id number] Date of Birth: 1951-06-04   Willa Frater, OTR/L [facility] [address] [place], [place]  [address] [phone] phone [phone] 09/01/20 2:26 PM

## 2020-09-01 NOTE — Therapy (Addendum)
[facility] [address] [place], [place], [address] Phone: [phone]   Fax:  [phone]  Physical Therapy Treatment  Patient Details  Name: Anna Bernard MRN: [id number] Date of Birth: 1951/06/11 Referring Provider (PT): Alonza Bogus, DO   Encounter Date: 09/01/2020   PT End of Session - 09/01/20 1345    Visit Number 12    Number of Visits 18    Date for PT Re-Evaluation 15/17/61   90 day cert for 9 wk POC   Authorization Type UHC Medicare    Progress Note Due on Visit 20    PT Start Time 0932    PT Stop Time 1015   Pt spent approx 10 minutes in restroom; 33 minutes chargeable   PT Time Calculation (min) 43 min    Equipment Utilized During Treatment Gait belt    Activity Tolerance Patient tolerated treatment well    Behavior During Therapy [facility] for tasks assessed/performed;Flat affect           Past Medical History:  Diagnosis Date  . Anemia, unspecified   . Hyperlipidemia, unspecified   . Hypertension   . Hypothyroidism, unspecified   . Pre-diabetes   . Vitamin B12 deficiency     Past Surgical History:  Procedure Laterality Date  . ABDOMINAL HYSTERECTOMY    . GIVENS CAPSULE STUDY N/A 04/16/2018   Procedure: GIVENS CAPSULE STUDY;  Surgeon: Carol Ada, MD;  Location: [facility];  Service: Endoscopy;  Laterality: N/A;  . PARS PLANA VITRECTOMY Right 08/16/2020   Procedure: 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;  Surgeon: Jalene Mullet, MD;  Location: [facility];  Service: Ophthalmology;  Laterality: Right;  . PHOTOCOAGULATION WITH LASER Right 08/16/2020   Procedure: PHOTOCOAGULATION WITH LASER;  Surgeon: Jalene Mullet, MD;  Location: [place];  Service: Ophthalmology;  Laterality: Right;  . SHOULDER SURGERY    . TONSILLECTOMY    . TUBAL LIGATION      There were no vitals filed for this visit.   Subjective Assessment - 09/01/20 0935    Subjective No changes since  last visit.  Had a bad day yesterday, and would prefer my husband come back to observe another therapy day, not today.    Patient is accompained by: Family member   Anna Bernard, husband   Pertinent History Dx with Parkinson's disease (Dr. Carles Collet) 04/2020 and Sinemet started; PMH of HTN, DM, hypothyroidism, orthostatic hypotension    Patient Stated Goals Get my walk back, be able to do things again.    Currently in Pain? Yes    Pain Score 4     Pain Location Foot    Pain Orientation Right    Pain Descriptors / Indicators Aching    Pain Type Chronic pain    Pain Onset More than a month ago    Pain Frequency Constant    Aggravating Factors  walking    Pain Relieving Factors medicine                             [facility] Adult PT Treatment/Exercise - 09/01/20 0946      Transfers   Transfers Sit to Stand;Stand to Sit    Sit to Stand 5: Supervision;With upper extremity assist;From bed;From chair/3-in-1    Sit to Stand Details Verbal cues for sequencing;Verbal cues for technique    Stand to Sit 5: Supervision;To chair/3-in-1;With upper extremity assist    Stand to Sit Details (  indicate cue type and reason) Verbal cues for sequencing;Verbal cues for technique    Comments Multiple times through session; from mat surface, then from locked rollator seat.  PT assisted pt in restroom with clothing management, pulling up pants, as she stands with BUE support on bar.  Supervision for sidestep from toilet to sink for handwashing.      Ambulation/Gait   Ambulation/Gait Yes    Ambulation/Gait Assistance 5: Supervision    Ambulation/Gait Assistance Details Cues for standing weightshifting to initiate gait, then cues for increased step length.    Ambulation Distance (Feet) 80 Feet   x 3; 110 ft x 2   Assistive device Rollator    Gait Pattern Step-through pattern;Decreased step length - right;Decreased step length - left;Decreased stance time - right;Decreased stance time - left;Decreased dorsiflexion  - right;Decreased dorsiflexion - left;Shuffle;Poor foot clearance - left;Poor foot clearance - right    Ambulation Surface Level;Indoor               Balance Exercises - 09/01/20 0001      Balance Exercises: Standing   Stepping Strategy Lateral;UE support;10 reps;Limitations    Stepping Strategy Limitations At counter, side step and weight shift x 10 reps, cues for RLE for increased foot clearance/step length.    Sidestepping Upper extremity support;2 reps   Along counter, R and L   Other Standing Exercises Side step together R, then L, x 10 reps          Reviewed HEP given last visit, with pt return demo understanding:  Exercises Seated Ankle Dorsiflexion AROM - 2-3 x daily - 5 x weekly - 2 sets - 10 reps Seated Heel Raise - 2-3 x daily - 5 x weekly - 2 sets - 10 reps Seated Ankle Circles - 2-3 x daily - 5 x weekly - 1 sets - 5-10 reps      PT Short Term Goals - 08/25/20 1120      PT SHORT TERM GOAL #1   Title Pt will perform HEP with family supervision, for improved balance, stregnth, transfers, and gait.  TARGET 08/14/2020    Baseline Not currently performing HEP, due to unplanned eye surgery    Time 5    Period Weeks    Status Not Met      PT SHORT TERM GOAL #2   Title Pt will improve 5x sit<>stand to less than or equal to 20 seconds for improved transfer efficiency and functional strength.    Baseline 08/10/20-23.66 seconds    Time 5    Period Weeks    Status Not Met      PT SHORT TERM GOAL #3   Title Berg score to be assessed, with Berg score improved by at least 5 points for decreased fall risk.    Baseline 28/56 on 08/10/20    Time 5    Period Weeks    Status Achieved      PT SHORT TERM GOAL #4   Title Pt will improve TUG score to less than or equal to 20 seconds for decreased fall risk.    Baseline time increased to 34.72 on 08/10/20   performed at end of session and pt reports fatigue and unwilling to perform 2nd attempts   Time 5    Period Weeks     Status Not Met      PT SHORT TERM GOAL #5   Title Pt/husband will verbalize understanding of fall prevention in home environment.    Time 5  Period Weeks    Status Achieved             PT Long Term Goals - 07/13/20 2030      PT LONG TERM GOAL #1   Title Pt will perform progression of HEP, including verbalizing plans for optimal fitness plan upon d/c from PT.  TARGET 09/11/2020    Time 9    Period Weeks    Status New      PT LONG TERM GOAL #2   Title Pt will improve 5x sit<>stand in less than or equal to 15 seconds for improved transfer efficiency and functional strength.    Time 9    Period Weeks    Status New      PT LONG TERM GOAL #3   Title Pt will improve gait velocity to at least 1.8 ft/sec for improved gait efficiency and safety.    Baseline 1.27 ft/sec    Time 9    Period Weeks    Status New      PT LONG TERM GOAL #4   Title Pt will improve TUG score to less than or equal to 15 seconds for decreased fall risk.    Time 9    Period Weeks    Status New      PT LONG TERM GOAL #5   Title Pt will ambulate at least 215 ft in 2 minute walk test, for improved gait efficiency and safety.    Baseline 175 ft    Time 9    Period Weeks    Status New      Additional Long Term Goals   Additional Long Term Goals Yes      PT LONG TERM GOAL #6   Title Pt will verbalize understanding of local Parkinson's disease resources.    Time 9    Period Weeks    Status New                 Plan - 09/01/20 1347    Clinical Impression Statement Pt appears frustrated today in therapy, that she is continueing to have bad days.  Educated pt to perhaps speak with Dr. Carles Collet, ahead of her scheduled appointment, as pt reports feeling depressed and has not mentioned this to Dr. Carles Collet before.  With review of ankle exercises, she reprots improvement in pain by 2 points on pain scale.  She responds well to cues for increased intensity and amplitude of movement with RLE, but needs  frequent cues for this to be consistent.  She will continue to benefit from skilled PT to further address strength, balance, gait for improved overall fucntional mobility.    Personal Factors and Comorbidities Comorbidity 3+    Comorbidities PMH:  HTN, DM, hypothyroidism, orthostatic hypotension    Examination-Activity Limitations Locomotion Level;Stand;Transfers    Examination-Participation Restrictions Community Activity;Other   Plans for travel in retirement   Stability/Clinical Decision Making Evolving/Moderate complexity    Rehab Potential Good    PT Frequency 2x / week    PT Duration Other (comment)   9 weeks   PT Treatment/Interventions ADLs/Self Care Home Management;DME Instruction;Neuromuscular re-education;Balance training;Therapeutic exercise;Therapeutic activities;Functional mobility training;Gait training;Patient/family education;Manual techniques    PT Next Visit Plan No restrictions per eye surgeron.  Husband may be present next session for education (pt requested not to have him come back today due to she wasn't having a good day); Work on standing exercises and try to add to HEP (at this point, since surgery, her HEP is only  walking in the house and the ankle exercises).  May consider adding back in her initial HEP and working more on standing exercises as well.    Consulted and Agree with Plan of Care Patient           Patient will benefit from skilled therapeutic intervention in order to improve the following deficits and impairments:  Abnormal gait,Difficulty walking,Decreased balance,Decreased mobility,Decreased strength,Postural dysfunction  Visit Diagnosis: Other abnormalities of gait and mobility  Unsteadiness on feet  Muscle weakness (generalized)     Problem List Patient Active Problem List   Diagnosis Date Noted  . Pain due to onychomycosis of toenails of both feet 06/17/2020  . Parkinson's disease ([place]) 04/23/2020  . Leg swelling 11/26/2019  . Chronic  venous insufficiency 11/26/2019  . Vitamin B12 deficiency 02/06/2018  . Sepsis secondary to UTI ([place]) 01/29/2018  . Microcytic anemia 01/29/2018  . Renal insufficiency 01/29/2018  . Pure hypercholesterolemia 12/24/2014  . Diabetes mellitus type 2, controlled, without complications ([place]) 48/09/6551  . Bruit of right carotid artery 12/24/2014  . Thyroid disease 07/06/2014  . Severe obesity (BMI >= 40) ([place]) 07/06/2014  . HTN (hypertension) 07/06/2014    Ellarie Picking W. 09/01/2020, 1:53 PM  Frazier Butt., PT   [facility] [address] [place], [place], [address] Phone: [phone]   Fax:  [phone]  Name: Anna Bernard MRN: [id number] Date of Birth: Feb 03, 1951

## 2020-09-01 NOTE — Patient Instructions (Signed)
    Bag Exercises:  Small trash bag or produce bag works best.  For all exercises, sit with big posture (sit up tall with head up) and use big movements. Perform the following exercises 1 times per day.   Hold end of bag in one hand. Stretch fingers out big to draw the entire bag into your palm. Repeat 3 times with each hand.  Can put bag on right leg when doing right hand.  Hold bag in one hand. Stretch both arms/hands out to the side as big as you can. Then, pass bag from one hand to the other BEHIND you. Stretch arms back out big after each pass. Repeat 10 times.  Hold bag in both hands in front of you with hands/arms shoulder length apart. Move bag behind your head. Repeat 10 times.  Sit at edge of bed/chair, reach with right hand to touch left calf (lift left leg to meet it).  Then perform with other arm/leg.  10 times.

## 2020-09-02 DIAGNOSIS — H35721 Serous detachment of retinal pigment epithelium, right eye: Secondary | ICD-10-CM | POA: Diagnosis not present

## 2020-09-02 DIAGNOSIS — H4311 Vitreous hemorrhage, right eye: Secondary | ICD-10-CM | POA: Diagnosis not present

## 2020-09-02 DIAGNOSIS — H53131 Sudden visual loss, right eye: Secondary | ICD-10-CM | POA: Diagnosis not present

## 2020-09-03 ENCOUNTER — Other Ambulatory Visit: Payer: Self-pay

## 2020-09-03 ENCOUNTER — Encounter: Payer: Self-pay | Admitting: Physical Therapy

## 2020-09-03 ENCOUNTER — Ambulatory Visit: Payer: Medicare Other | Admitting: Physical Therapy

## 2020-09-03 ENCOUNTER — Ambulatory Visit: Payer: Medicare Other | Admitting: Occupational Therapy

## 2020-09-03 DIAGNOSIS — R2681 Unsteadiness on feet: Secondary | ICD-10-CM | POA: Diagnosis not present

## 2020-09-03 DIAGNOSIS — R29898 Other symptoms and signs involving the musculoskeletal system: Secondary | ICD-10-CM

## 2020-09-03 DIAGNOSIS — R278 Other lack of coordination: Secondary | ICD-10-CM

## 2020-09-03 DIAGNOSIS — M25621 Stiffness of right elbow, not elsewhere classified: Secondary | ICD-10-CM | POA: Diagnosis not present

## 2020-09-03 DIAGNOSIS — M25611 Stiffness of right shoulder, not elsewhere classified: Secondary | ICD-10-CM

## 2020-09-03 DIAGNOSIS — M6281 Muscle weakness (generalized): Secondary | ICD-10-CM | POA: Diagnosis not present

## 2020-09-03 DIAGNOSIS — R293 Abnormal posture: Secondary | ICD-10-CM

## 2020-09-03 DIAGNOSIS — R2689 Other abnormalities of gait and mobility: Secondary | ICD-10-CM

## 2020-09-03 DIAGNOSIS — R29818 Other symptoms and signs involving the nervous system: Secondary | ICD-10-CM | POA: Diagnosis not present

## 2020-09-03 NOTE — Therapy (Signed)
[facility] [address] [place], [place], [address] Phone: [phone]   Fax:  [phone]  Occupational Therapy Treatment  Patient Details  Name: Anna Bernard MRN: [id number] Date of Birth: [birth year] Referring Provider (OT): Dr. Lurena Joiner Tat   Encounter Date: 09/03/2020   OT End of Session - 09/03/20 1025    Visit Number 8    Number of Visits 17    Date for OT Re-Evaluation 09/11/20    Authorization Type Medicare UHC:  no visit limit or auth required    Authorization Time Period cert. date 07/13/20-10/11/20    Authorization - Visit Number 8    Authorization - Number of Visits 10    Progress Note Due on Visit 10    OT Start Time 1021    OT Stop Time 1100    OT Time Calculation (min) 39 min    Activity Tolerance Patient tolerated treatment well    Behavior During Therapy WFL for tasks assessed/performed           Past Medical History:  Diagnosis Date  . Anemia, unspecified   . Hyperlipidemia, unspecified   . Hypertension   . Hypothyroidism, unspecified   . Pre-diabetes   . Vitamin B12 deficiency     Past Surgical History:  Procedure Laterality Date  . ABDOMINAL HYSTERECTOMY    . GIVENS CAPSULE STUDY N/A 04/16/2018   Procedure: GIVENS CAPSULE STUDY;  Surgeon: Jeani Hawking, MD;  [facility] END[facility]COPY;  Service: Endoscopy;  Laterality: N/A;  . PARS PLANA VITRECTOMY Right 08/16/2020   Procedure: 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;  Surgeon: Carmela Rima, MD;  Location: [facility] OR;  Service: Ophthalmology;  Laterality: Right;  . PHOTOCOAGULATION WITH LASER Right 08/16/2020   Procedure: PHOTOCOAGULATION WITH LASER;  Surgeon: Carmela Rima, MD;  Location: [facility] OR;  Service: Ophthalmology;  Laterality: Right;  . SHOULDER SURGERY    . TONSILLECTOMY    . TUBAL LIGATION      There were no vitals filed for this visit.   Subjective Assessment - 09/03/20 1022    Subjective   pt denies pain    Pertinent History newly diagnosed PD (in last month).   PMH:  anemia, hyperlipidemia, HTN, hypothyroidism, Vitamin B12 deficiency, hx of R RTC surgery, leg swelling, arthritis (per pt), obesity, orthostatic hypotension    Patient Stated Goals be able to move better and travel (to [place] first)    Currently in Pain? No/denies    Pain Onset More than a month ago           Sitting, functional reaching with each UE overhead to place large pegs in vertical pegboard, set-up and min cueing for large amplitude movement strategies, trunk rotation/wt. Shift, and opening hands.   Sitting, functional reaching forward for wt. Shift then in ER/abduction to flip cards with focus on finger extension and supination with trunk rotation/lateral wt. Shift with min cueing.    Sitting, closed-chain shoulder flex (floor>overhead) with ball with min cues for finger extension and elbow ext.        OT Education - 09/03/20 1443    Education Details Bag HEP for simulated ADLs/application for ADL strategies--reviewed and pt returned demo each    Person(s) Educated Patient;Spouse    Methods Explanation;Demonstration;Verbal cues;Tactile cues    Comprehension Verbalized understanding;Returned demonstration;Verbal cues required;Tactile cues required;Need further instruction            OT Short Term Goals - 09/01/20 1055  OT SHORT TERM GOAL #1   Title Pt will be independent with PD-specific HEP--check STGs 09/11/20    Time 4    Period Weeks    Status On-going   09/01/20:  needs cueing/reinforcement     OT SHORT TERM GOAL #2   Title Pt will demo at least 110* R shoulder flex with -40* elbow ext for functional reach.    Baseline 100* with -45* elbow ext    Time 4    Period Weeks    Status Achieved   09/01/20:  110* with -30* elbow ext     OT SHORT TERM GOAL #3   Title Pt will improve R hand coordination for ADLs as shown by improving time on 9-hole peg test by at least 6sec.    Baseline  56.84sec    Time 4    Period Weeks    Status New      OT SHORT TERM GOAL #4   Title Pt willdemonstrate improved ease with dressing as evidenced by  performing  PPT#4 donning/ doffing jacket in 1 min 15 secs    Baseline 1 min 29 secs    Time 4    Period Weeks    Status New      OT SHORT TERM GOAL #5   Title Pt will verbalize understanding of appropriate community resources and ways to decr risk of future complications.    Time 4    Period Weeks    Status New             OT Long Term Goals - 07/13/20 2158      OT LONG TERM GOAL #1   Title Pt will verbalize understanding of adaptive strategies to incr ease, safety, and independence with ADLs and IADLs.--check LTGs 10/11/20    Time 8    Period Weeks    Status New      OT LONG TERM GOAL #2   Title Pt will demo at least 115* R shoulder flex with -35* elbow ext for functional reach.    Baseline 100* with -45* elbow ext    Time 8    Period Weeks    Status New      OT LONG TERM GOAL #3   Title Pt will perform UB dressing consistently mod I.    Time 8    Period Weeks    Status New      OT LONG TERM GOAL #4   Title Pt will perform LB dressing consistently mod I.    Time 8    Period Weeks    Status New      OT LONG TERM GOAL #5   Title Pt willPt will improve R hand coordination for ADLs as shown by improving time on 9-hole peg test by at least 10sec.    Baseline 56.84sec    Time 8    Period Weeks    Status New      OT LONG TERM GOAL #6   Title Pt will improved RUE functional reaching and coordination for ADLs as shown by improving score on box and blocks test by at least 5 blocks.    Baseline R-31 blocks, L-40 blocks    Time 8    Period Weeks    Status New                 Plan - 09/03/20 1026    Clinical Impression Statement Pt is progressing slowly towards goals with rigidity affecting UE functional use.  Pt demo incr difficulty today initially, but also close to time for medication.    OT Occupational  Profile and History Detailed Assessment- Review of Records and additional review of physical, cognitive, psychosocial history related to current functional performance    Occupational performance deficits (Please refer to evaluation for details): ADL's;IADL's;Social Participation;Leisure    Body Structure / Function / Physical Skills ADL;Balance;Coordination;Decreased knowledge of use of DME;GMC;UE functional use;FMC;Tone;Mobility;Flexibility;IADL;ROM;Dexterity;Body mechanics    Rehab Potential Good    OT Frequency 2x / week    OT Duration 8 weeks    OT Treatment/Interventions Self-care/ADL training;Moist Heat;Aquatic Therapy;Therapeutic activities;Balance training;DME and/or AE instruction;Therapeutic exercise;Passive range of motion;Functional Mobility Training;Neuromuscular education;Cryotherapy;Manual Therapy;Energy conservation;Patient/family education;Ultrasound    Plan ADL strategies/large amplitude movement strategies, check STGs    Consulted and Agree with Plan of Care Patient           Patient will benefit from skilled therapeutic intervention in order to improve the following deficits and impairments:   Body Structure / Function / Physical Skills: ADL,Balance,Coordination,Decreased knowledge of use of DME,GMC,UE functional use,FMC,Tone,Mobility,Flexibility,IADL,ROM,Dexterity,Body mechanics       Visit Diagnosis: Other symptoms and signs involving the musculoskeletal system  Other symptoms and signs involving the nervous system  Stiffness of right elbow, not elsewhere classified  Stiffness of right shoulder, not elsewhere classified  Other lack of coordination  Abnormal posture  Unsteadiness on feet  Other abnormalities of gait and mobility    Problem List Patient Active Problem List   Diagnosis Date Noted  . Pain due to onychomycosis of toenails of both feet 06/17/2020  . Parkinson's disease (HCC) 04/23/2020  . Leg swelling 11/26/2019  . Chronic venous  insufficiency 11/26/2019  . Vitamin B12 deficiency 02/06/2018  . Sepsis secondary to UTI (HCC) 01/29/2018  . Microcytic anemia 01/29/2018  . Renal insufficiency 01/29/2018  . Pure hypercholesterolemia 12/24/2014  . Diabetes mellitus type 2, controlled, without complications (HCC) 12/24/2014  . Bruit of right carotid artery 12/24/2014  . Thyroid disease 07/06/2014  . Severe obesity (BMI >= 40) (HCC) 07/06/2014  . HTN (hypertension) 07/06/2014    [facility] 09/03/2020, 2:51 PM  [facility] [address] [place], [place], [address] Phone: [phone]   Fax:  [phone]  Name: ASPEN LAWRANCE MRN: [id number] Date of Birth: 03-27-1951   Willa Frater, OTR/L [facility] [address] [place], [place]  [address] [phone] phone [phone] 09/03/20 2:51 PM

## 2020-09-03 NOTE — Therapy (Signed)
[facility] [address] [place], [place], [address] Phone: [phone]   Fax:  [phone]  Physical Therapy Treatment  Patient Details  Name: Anna Bernard MRN: [id number] Date of Birth: Apr 22, 1951 Referring Provider (PT): Alonza Bogus, DO   Encounter Date: 09/03/2020   PT End of Session - 09/03/20 1037    Visit Number 13    Number of Visits 18    Date for PT Re-Evaluation 36/46/80   90 day cert for 9 wk POC   Authorization Type UHC Medicare    Progress Note Due on Visit 20    PT Start Time [phone]   pt using restroom at beginning of session   PT Stop Time 1017    PT Time Calculation (min) 39 min    Equipment Utilized During Treatment Gait belt    Activity Tolerance Patient tolerated treatment well    Behavior During Therapy [facility] for tasks assessed/performed;Flat affect           Past Medical History:  Diagnosis Date  . Anemia, unspecified   . Hyperlipidemia, unspecified   . Hypertension   . Hypothyroidism, unspecified   . Pre-diabetes   . Vitamin B12 deficiency     Past Surgical History:  Procedure Laterality Date  . ABDOMINAL HYSTERECTOMY    . GIVENS CAPSULE STUDY N/A 04/16/2018   Procedure: GIVENS CAPSULE STUDY;  Surgeon: Carol Ada, MD;  Location: [place];  Service: Endoscopy;  Laterality: N/A;  . PARS PLANA VITRECTOMY Right 08/16/2020   Procedure: 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;  Surgeon: Jalene Mullet, MD;  Location: [place];  Service: Ophthalmology;  Laterality: Right;  . PHOTOCOAGULATION WITH LASER Right 08/16/2020   Procedure: PHOTOCOAGULATION WITH LASER;  Surgeon: Jalene Mullet, MD;  Location: [place];  Service: Ophthalmology;  Laterality: Right;  . SHOULDER SURGERY    . TONSILLECTOMY    . TUBAL LIGATION      There were no vitals filed for this visit.   Subjective Assessment - 09/03/20 0941    Subjective Feeling happy today - its a good day.     Patient is accompained by: Family member   [name], husband   Pertinent History Dx with Parkinson's disease (Dr. Carles Collet) 04/2020 and Sinemet started; PMH of HTN, DM, hypothyroidism, orthostatic hypotension    Patient Stated Goals Get my walk back, be able to do things again.    Currently in Pain? Yes    Pain Score 2     Pain Location Foot    Pain Orientation Right    Pain Descriptors / Indicators Aching    Pain Type Chronic pain    Pain Onset More than a month ago    Aggravating Factors  standing on it/walking too much    Pain Relieving Factors exercises, medicine                             [facility] Adult PT Treatment/Exercise - 09/03/20 0001      Transfers   Sit to Stand 5: Supervision;With upper extremity assist;From bed;From chair/3-in-1    Sit to Stand Details Verbal cues for sequencing;Verbal cues for technique    Sit to Stand Details (indicate cue type and reason) initial cues for hand placement    Stand to Sit 5: Supervision;To chair/3-in-1;With upper extremity assist    Stand to Sit Details (indicate cue type and reason) Verbal cues for sequencing;Verbal cues for technique  Ambulation/Gait   Ambulation/Gait Yes    Ambulation/Gait Assistance 5: Supervision    Ambulation/Gait Assistance Details cues for incr step length, a couple of times needing to reset with tall posture and weight shift before continuing due to pt beginning to push rollator too far forward and taking smaller steps    Ambulation Distance (Feet) 230 Feet   (apart of 3 min 15 sec walk) x1, 70' x 2   Assistive device Rollator    Gait Pattern Step-through pattern;Decreased step length - right;Decreased step length - left;Decreased stance time - right;Decreased stance time - left;Decreased dorsiflexion - right;Decreased dorsiflexion - left;Shuffle;Poor foot clearance - left;Poor foot clearance - right    Ambulation Surface Level;Indoor    Gait Comments 3 minute and 15 second walk: 230'              Access Code: [email or web address] URL: [email or web address] Date: 09/03/2020 Prepared by: Janann August  Exercises  New additions to HEP:  Side Stepping with Counter Support - 1-2 x daily - 5 x weekly - 2 sets - 5 reps - Side step together R, then L, 2 x 5  reps  Lateral Weight Shift - 1-2 x daily - 5 x weekly - 2 sets - 10 reps- cues for wider BOS and pushing through heels  Alternating Step Backward with Support - 1-2 x daily - 5 x weekly - 2 sets - 5 reps - cues for incr foot clearance     Pt's husband present throughout session today. Explained rationale and purpose of each exercise. Husband able to verbalize understanding.  Discussed taking seated rest breaks in rollator in between when feeling fatigued.   Balance Exercises - 09/03/20 0001      Balance Exercises: Standing   Sidestepping Upper extremity support;3 reps;Limitations    Sidestepping Limitations at countertop down and back 3 reps, cues for incr foot clearance              09/03/20 0001  Balance Exercises: Standing  Sidestepping Upper extremity support;3 reps;Limitations  Sidestepping Limitations at countertop down and back 3 reps, cues for incr foot clearance     PT Education - 09/03/20 1033    Education Details standing additions to HEP, scheduled for PT/OT throughout january (as pt's current appts end at end of December) - spouse and husband in agreement, pt reports she is supposed to have glaucoma surgery in january but is not sure when    Person(s) Educated Patient;Spouse    Methods Explanation;Demonstration;Handout;Verbal cues    Comprehension Verbalized understanding;Returned demonstration            PT Short Term Goals - 08/25/20 1120      PT SHORT TERM GOAL #1   Title Pt will perform HEP with family supervision, for improved balance, stregnth, transfers, and gait.  TARGET 08/14/2020    Baseline Not currently performing HEP, due to unplanned eye surgery    Time 5    Period Weeks     Status Not Met      PT SHORT TERM GOAL #2   Title Pt will improve 5x sit<>stand to less than or equal to 20 seconds for improved transfer efficiency and functional strength.    Baseline 08/10/20-23.66 seconds    Time 5    Period Weeks    Status Not Met      PT SHORT TERM GOAL #3   Title Berg score to be assessed, with Berg score improved by at least 5 points for decreased fall risk.  Baseline 28/56 on 08/10/20    Time 5    Period Weeks    Status Achieved      PT SHORT TERM GOAL #4   Title Pt will improve TUG score to less than or equal to 20 seconds for decreased fall risk.    Baseline time increased to 34.72 on 08/10/20   performed at end of session and pt reports fatigue and unwilling to perform 2nd attempts   Time 5    Period Weeks    Status Not Met      PT SHORT TERM GOAL #5   Title Pt/husband will verbalize understanding of fall prevention in home environment.    Time 5    Period Weeks    Status Achieved             PT Long Term Goals - 07/13/20 2030      PT LONG TERM GOAL #1   Title Pt will perform progression of HEP, including verbalizing plans for optimal fitness plan upon d/c from PT.  TARGET 09/11/2020    Time 9    Period Weeks    Status New      PT LONG TERM GOAL #2   Title Pt will improve 5x sit<>stand in less than or equal to 15 seconds for improved transfer efficiency and functional strength.    Time 9    Period Weeks    Status New      PT LONG TERM GOAL #3   Title Pt will improve gait velocity to at least 1.8 ft/sec for improved gait efficiency and safety.    Baseline 1.27 ft/sec    Time 9    Period Weeks    Status New      PT LONG TERM GOAL #4   Title Pt will improve TUG score to less than or equal to 15 seconds for decreased fall risk.    Time 9    Period Weeks    Status New      PT LONG TERM GOAL #5   Title Pt will ambulate at least 215 ft in 2 minute walk test, for improved gait efficiency and safety.    Baseline 175 ft    Time 9     Period Weeks    Status New      Additional Long Term Goals   Additional Long Term Goals Yes      PT LONG TERM GOAL #6   Title Pt will verbalize understanding of local Parkinson's disease resources.    Time 9    Period Weeks    Status New                 Plan - 09/03/20 1044    Clinical Impression Statement Pt reporting feeling good today in therapy. Able to walk 230' with rollator in 3 minutes and 15 seconds - 2 times needed cues to stop and reset to continue with larger step length B. Pt's husband present throughout session for instruction with new additions to HEP. Added side steps to R/L, lateral weight shifting and backwards stepping with BUE support at countertop to pt's HEP. No issues noted, with pt feeling good while doing them. Will continue to progress towards LTGs    Personal Factors and Comorbidities Comorbidity 3+    Comorbidities PMH:  HTN, DM, hypothyroidism, orthostatic hypotension    Examination-Activity Limitations Locomotion Level;Stand;Transfers    Examination-Participation Restrictions Community Activity;Other   Plans for travel in retirement   Stability/Clinical Decision Making Evolving/Moderate  complexity    Rehab Potential Good    PT Frequency 2x / week    PT Duration Other (comment)   9 weeks   PT Treatment/Interventions ADLs/Self Care Home Management;DME Instruction;Neuromuscular re-education;Balance training;Therapeutic exercise;Therapeutic activities;Functional mobility training;Gait training;Patient/family education;Manual techniques    PT Next Visit Plan No restrictions per eye surgeron. how were standing additions to pt's HEP? continue standing exercises at countertop. functional strengthening/step ups and stair training (pt and husband at end of session today report incr difficulty with steps). did pt schedule more appts?    Consulted and Agree with Plan of Care Patient           Patient will benefit from skilled therapeutic intervention in  order to improve the following deficits and impairments:  Abnormal gait,Difficulty walking,Decreased balance,Decreased mobility,Decreased strength,Postural dysfunction  Visit Diagnosis: Other symptoms and signs involving the nervous system  Other symptoms and signs involving the musculoskeletal system  Other lack of coordination  Abnormal posture  Unsteadiness on feet     Problem List Patient Active Problem List   Diagnosis Date Noted  . Pain due to onychomycosis of toenails of both feet 06/17/2020  . Parkinson's disease ([place]) 04/23/2020  . Leg swelling 11/26/2019  . Chronic venous insufficiency 11/26/2019  . Vitamin B12 deficiency 02/06/2018  . Sepsis secondary to UTI ([place]) 01/29/2018  . Microcytic anemia 01/29/2018  . Renal insufficiency 01/29/2018  . Pure hypercholesterolemia 12/24/2014  . Diabetes mellitus type 2, controlled, without complications ([place]) 27/74/1287  . Bruit of right carotid artery 12/24/2014  . Thyroid disease 07/06/2014  . Severe obesity (BMI >= 40) ([place]) 07/06/2014  . HTN (hypertension) 07/06/2014    Arliss Journey, PT, DPT  09/03/2020, 10:47 AM  [facility] [address] [place], [place], [address] Phone: [phone]   Fax:  [phone]  Name: Anna Bernard MRN: [id number] Date of Birth: 1951/01/11

## 2020-09-03 NOTE — Patient Instructions (Signed)
Access Code: [email or web address] URL: [email or web address] Date: 09/03/2020 Prepared by: Sherlie Ban  Exercises Seated Ankle Dorsiflexion AROM - 2-3 x daily - 5 x weekly - 2 sets - 10 reps Seated Heel Raise - 2-3 x daily - 5 x weekly - 2 sets - 10 reps Seated Ankle Circles - 2-3 x daily - 5 x weekly - 1 sets - 5-10 reps Side Stepping with Counter Support - 1-2 x daily - 5 x weekly - 2 sets - 5 reps Lateral Weight Shift - 1-2 x daily - 5 x weekly - 2 sets - 10 reps Alternating Step Backward with Support - 1-2 x daily - 5 x weekly - 2 sets - 5 reps

## 2020-09-08 ENCOUNTER — Other Ambulatory Visit: Payer: Self-pay

## 2020-09-08 ENCOUNTER — Ambulatory Visit: Payer: Medicare Other | Admitting: Physical Therapy

## 2020-09-08 ENCOUNTER — Ambulatory Visit: Payer: Medicare Other | Admitting: Occupational Therapy

## 2020-09-08 ENCOUNTER — Encounter: Payer: Self-pay | Admitting: Physical Therapy

## 2020-09-08 DIAGNOSIS — R293 Abnormal posture: Secondary | ICD-10-CM | POA: Diagnosis not present

## 2020-09-08 DIAGNOSIS — M25611 Stiffness of right shoulder, not elsewhere classified: Secondary | ICD-10-CM

## 2020-09-08 DIAGNOSIS — R29898 Other symptoms and signs involving the musculoskeletal system: Secondary | ICD-10-CM

## 2020-09-08 DIAGNOSIS — R29818 Other symptoms and signs involving the nervous system: Secondary | ICD-10-CM

## 2020-09-08 DIAGNOSIS — M25621 Stiffness of right elbow, not elsewhere classified: Secondary | ICD-10-CM | POA: Diagnosis not present

## 2020-09-08 DIAGNOSIS — R2681 Unsteadiness on feet: Secondary | ICD-10-CM

## 2020-09-08 DIAGNOSIS — R2689 Other abnormalities of gait and mobility: Secondary | ICD-10-CM

## 2020-09-08 DIAGNOSIS — R278 Other lack of coordination: Secondary | ICD-10-CM

## 2020-09-08 DIAGNOSIS — M6281 Muscle weakness (generalized): Secondary | ICD-10-CM | POA: Diagnosis not present

## 2020-09-08 NOTE — Therapy (Signed)
[facility] [address] [place], [place], [address] Phone: [phone]   Fax:  [phone]  Occupational Therapy Treatment  Patient Details  Name: Anna Bernard MRN: [id number] Date of Birth: 14-Oct-1950 Referring Provider (OT): Dr. Wells Guiles Tat   Encounter Date: 09/08/2020   OT End of Session - 09/08/20 1029    Visit Number 9    Number of Visits 17    Date for OT Re-Evaluation 09/11/20    Authorization Type Medicare UHC:  no visit limit or auth required    Authorization Time Period cert. date 07/13/20-10/11/20    Authorization - Visit Number 9    Authorization - Number of Visits 10    Progress Note Due on Visit 10    OT Start Time 1019    OT Stop Time 1100    OT Time Calculation (min) 41 min    Activity Tolerance Patient tolerated treatment well    Behavior During Therapy WFL for tasks assessed/performed           Past Medical History:  Diagnosis Date  . Anemia, unspecified   . Hyperlipidemia, unspecified   . Hypertension   . Hypothyroidism, unspecified   . Pre-diabetes   . Vitamin B12 deficiency     Past Surgical History:  Procedure Laterality Date  . ABDOMINAL HYSTERECTOMY    . GIVENS CAPSULE STUDY N/A 04/16/2018   Procedure: GIVENS CAPSULE STUDY;  Surgeon: Carol Ada, MD;  Location: [facility];  Service: Endoscopy;  Laterality: N/A;  . PARS PLANA VITRECTOMY Right 08/16/2020   Procedure: 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;  Surgeon: Jalene Mullet, MD;  Location: [facility];  Service: Ophthalmology;  Laterality: Right;  . PHOTOCOAGULATION WITH LASER Right 08/16/2020   Procedure: PHOTOCOAGULATION WITH LASER;  Surgeon: Jalene Mullet, MD;  Location: [facility];  Service: Ophthalmology;  Laterality: Right;  . SHOULDER SURGERY    . TONSILLECTOMY    . TUBAL LIGATION      There were no vitals filed for this visit.   Subjective Assessment - 09/08/20 1017    Subjective   "I can't understand why this (right) arm is so weak"    Patient is accompanied by: Family member    Pertinent History newly diagnosed PD (in last month).   PMH:  anemia, hyperlipidemia, HTN, hypothyroidism, Vitamin B12 deficiency, hx of R RTC surgery, leg swelling, arthritis (per pt), obesity, orthostatic hypotension    Patient Stated Goals be able to move better and travel (to [place] first)    Currently in Pain? Yes    Pain Score 4     Pain Location Foot    Pain Orientation Right    Pain Descriptors / Indicators Aching    Pain Type Chronic pain    Pain Onset More than a month ago    Pain Frequency Constant    Aggravating Factors  standing, walking too much    Pain Relieving Factors exercises, medicine             Table slides for shoulder flex and abduction with each UE for stretch in trunk, forward wt. Shift, and elbow ext with min cueing.  Flipping cards with each hand with min-mod cueing for incr movement amplitude with R hand and occasional min cueing with L hand.  However, pt demo incr ease/performance with cueing.  Practicing opening/closing bottles/jars with large amplitude movement strategies.  Pt given min-mod cueing for incr wrist movement after instruction.  Also instructed pt in  use of shelf liner to assist if needed, but recommended pt use strategies first instead of asking for help.  Standing, practicing donning/doffing jacket.  Attempted different strategies.  Recommended use of trunk rotation with doffing and recommended big, deliberate movements with donning including turning head/eyes, opening hand big before pulling, and pushing hand into sleeve.  Pt will need continued practice.  Also educated pt/husband in importance of head/eye movements to decr risk of future complications.  Pt/husband verbalized understanding.        OT Short Term Goals - 09/08/20 1344      OT SHORT TERM GOAL #1   Title Pt will be independent with PD-specific HEP--check STGs 09/11/20    Time 4     Period Weeks    Status On-going   09/01/20:  needs cueing/reinforcement     OT SHORT TERM GOAL #2   Title Pt will demo at least 110* R shoulder flex with -40* elbow ext for functional reach.    Baseline 100* with -45* elbow ext    Time 4    Period Weeks    Status Achieved   09/01/20:  110* with -30* elbow ext     OT SHORT TERM GOAL #3   Title Pt will improve R hand coordination for ADLs as shown by improving time on 9-hole peg test by at least 6sec.    Baseline 56.84sec    Time 4    Period Weeks    Status On-going   09/08/20:  61sec     OT SHORT TERM GOAL #4   Title Pt willdemonstrate improved ease with dressing as evidenced by  performing  PPT#4 donning/ doffing jacket in 1 min 15 secs    Baseline 1 min 29 secs    Time 4    Period Weeks    Status On-going      OT SHORT TERM GOAL #5   Title Pt will verbalize understanding of appropriate community resources and ways to decr risk of future complications.    Time 4    Period Weeks    Status On-going   09/08/20:  not fully met, continue goal            OT Long Term Goals - 07/13/20 2158      OT LONG TERM GOAL #1   Title Pt will verbalize understanding of adaptive strategies to incr ease, safety, and independence with ADLs and IADLs.--check LTGs 10/11/20    Time 8    Period Weeks    Status New      OT LONG TERM GOAL #2   Title Pt will demo at least 115* R shoulder flex with -35* elbow ext for functional reach.    Baseline 100* with -45* elbow ext    Time 8    Period Weeks    Status New      OT LONG TERM GOAL #3   Title Pt will perform UB dressing consistently mod I.    Time 8    Period Weeks    Status New      OT LONG TERM GOAL #4   Title Pt will perform LB dressing consistently mod I.    Time 8    Period Weeks    Status New      OT LONG TERM GOAL #5   Title Pt willPt will improve R hand coordination for ADLs as shown by improving time on 9-hole peg test by at least 10sec.    Baseline 56.84sec  Time 8     Period Weeks    Status New      OT LONG TERM GOAL #6   Title Pt will improved RUE functional reaching and coordination for ADLs as shown by improving score on box and blocks test by at least 5 blocks.    Baseline R-31 blocks, L-40 blocks    Time 8    Period Weeks    Status New                 Plan - 09/08/20 1029    Clinical Impression Statement Pt is progressing slowly towards goals but demo improved movement quality/ease with use of large amplitude movement strategies.  Pt will need consistent repetition and reinforcement in therapy and at home (by husband).  Pt also encouraged to try to do as much as she can for herself prior to asking for help.    OT Occupational Profile and History Detailed Assessment- Review of Records and additional review of physical, cognitive, psychosocial history related to current functional performance    Occupational performance deficits (Please refer to evaluation for details): ADL's;IADL's;Social Participation;Leisure    Body Structure / Function / Physical Skills ADL;Balance;Coordination;Decreased knowledge of use of DME;GMC;UE functional use;FMC;Tone;Mobility;Flexibility;IADL;ROM;Dexterity;Body mechanics    Rehab Potential Good    OT Frequency 2x / week    OT Duration 8 weeks    OT Treatment/Interventions Self-care/ADL training;Moist Heat;Aquatic Therapy;Therapeutic activities;Balance training;DME and/or AE instruction;Therapeutic exercise;Passive range of motion;Functional Mobility Training;Neuromuscular education;Cryotherapy;Manual Therapy;Energy conservation;Patient/family education;Ultrasound    Plan progress note; ADL strategies/large amplitude movement strategies    Consulted and Agree with Plan of Care Patient;Family member/caregiver    Family Member Consulted husband           Patient will benefit from skilled therapeutic intervention in order to improve the following deficits and impairments:   Body Structure / Function / Physical  Skills: ADL,Balance,Coordination,Decreased knowledge of use of DME,GMC,UE functional use,FMC,Tone,Mobility,Flexibility,IADL,ROM,Dexterity,Body mechanics       Visit Diagnosis: Other symptoms and signs involving the nervous system  Other symptoms and signs involving the musculoskeletal system  Other lack of coordination  Abnormal posture  Unsteadiness on feet  Stiffness of right elbow, not elsewhere classified  Stiffness of right shoulder, not elsewhere classified  Other abnormalities of gait and mobility    Problem List Patient Active Problem List   Diagnosis Date Noted  . Pain due to onychomycosis of toenails of both feet 06/17/2020  . Parkinson's disease ([facility]) 04/23/2020  . Leg swelling 11/26/2019  . Chronic venous insufficiency 11/26/2019  . Vitamin B12 deficiency 02/06/2018  . Sepsis secondary to UTI ([facility]) 01/29/2018  . Microcytic anemia 01/29/2018  . Renal insufficiency 01/29/2018  . Pure hypercholesterolemia 12/24/2014  . Diabetes mellitus type 2, controlled, without complications ([facility]) 26/33/3545  . Bruit of right carotid artery 12/24/2014  . Thyroid disease 07/06/2014  . Severe obesity (BMI >= 40) ([facility]) 07/06/2014  . HTN (hypertension) 07/06/2014    [facility] 09/08/2020, 1:47 PM  [facility] [address] [place], [place], [address] Phone: [phone]   Fax:  [phone]  Name: Anna Bernard MRN: [id number] Date of Birth: 01-05-1951   Vianne Bulls, OTR/L [facility] [address] [place], [place]  [address] [phone] phone [phone] 09/08/20 1:47 PM

## 2020-09-08 NOTE — Therapy (Signed)
[facility] [address] [place], [place], [address] Phone: [phone]   Fax:  [phone]  Physical Therapy Treatment  Patient Details  Name: Anna Bernard MRN: [id number] Date of Birth: [birth year] Referring Provider (PT): Alonza Bogus, DO   Encounter Date: 09/08/2020   PT End of Session - 09/08/20 1050    Visit Number 14    Number of Visits 18    Date for PT Re-Evaluation 73/71/06   90 day cert for 9 wk POC   Authorization Type UHC Medicare    Progress Note Due on Visit 20    PT Start Time 0934    PT Stop Time 1015    PT Time Calculation (min) 41 min    Equipment Utilized During Treatment Gait belt    Activity Tolerance Patient tolerated treatment well    Behavior During Therapy [facility] for tasks assessed/performed;Flat affect           Past Medical History:  Diagnosis Date  . Anemia, unspecified   . Hyperlipidemia, unspecified   . Hypertension   . Hypothyroidism, unspecified   . Pre-diabetes   . Vitamin B12 deficiency     Past Surgical History:  Procedure Laterality Date  . ABDOMINAL HYSTERECTOMY    . GIVENS CAPSULE STUDY N/A 04/16/2018   Procedure: GIVENS CAPSULE STUDY;  Surgeon: Carol Ada, MD;  Location: [facility];  Service: Endoscopy;  Laterality: N/A;  . PARS PLANA VITRECTOMY Right 08/16/2020   Procedure: 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;  Surgeon: Jalene Mullet, MD;  Location: [facility];  Service: Ophthalmology;  Laterality: Right;  . PHOTOCOAGULATION WITH LASER Right 08/16/2020   Procedure: PHOTOCOAGULATION WITH LASER;  Surgeon: Jalene Mullet, MD;  Location: [facility];  Service: Ophthalmology;  Laterality: Right;  . SHOULDER SURGERY    . TONSILLECTOMY    . TUBAL LIGATION      There were no vitals filed for this visit.   Subjective Assessment - 09/08/20 0936    Subjective Having a good day today.  Just have my arthritis bothering me with the cold.   Going to the doctor tomorrow and will ask her about foot pain.    Patient is accompained by: Family member   [name], husband   Pertinent History Dx with Parkinson's disease (Dr. Carles Collet) 04/2020 and Sinemet started; PMH of HTN, DM, hypothyroidism, orthostatic hypotension    Patient Stated Goals Get my walk back, be able to do things again.    Currently in Pain? Yes    Pain Score 4     Pain Location Foot    Pain Orientation Right    Pain Descriptors / Indicators Aching    Pain Type Chronic pain    Pain Onset More than a month ago    Pain Frequency Constant    Aggravating Factors  standing on it/walking too much    Pain Relieving Factors exercises, medicine                             [facility] Adult PT Treatment/Exercise - 09/08/20 0001      Transfers   Sit to Stand 5: Supervision;With upper extremity assist;From bed;From chair/3-in-1    Sit to Stand Details Verbal cues for sequencing;Verbal cues for technique    Sit to Stand Details (indicate cue type and reason) Pt does better today with hand placement    Stand to Sit 5: Supervision;To chair/3-in-1;With upper extremity assist;To  bed    Stand to Sit Details (indicate cue type and reason) Verbal cues for sequencing;Verbal cues for technique      Ambulation/Gait   Ambulation/Gait Yes    Ambulation/Gait Assistance 5: Supervision    Ambulation/Gait Assistance Details Initial cues for upright posture, increased step length.  Pt needs to stop only once during 230 ft of gait, to stop, reset posture and start again with improved step length/foot clearance.    Ambulation Distance (Feet) 80 Feet   100 ft, 15 ft, then 230 ft   Assistive device Rollator    Gait Pattern Step-through pattern;Decreased step length - right;Decreased step length - left;Decreased stance time - right;Decreased stance time - left;Decreased dorsiflexion - right;Decreased dorsiflexion - left;Shuffle;Poor foot clearance - left;Poor foot clearance - right     Ambulation Surface Level;Indoor    Stairs Yes    Stairs Assistance 4: Min guard    Stairs Assistance Details (indicate cue type and reason) Husband present during stair training, cues for sequence of stair negotiation and reinforcement of forward step down the steps.  Cues for foot clearance with RLE descending.    Stair Management Technique Two rails;Step to pattern;Forwards   1st trial, pt ascends alternating pattern; cues for up leading with good leg, down leading with bad leg.   Number of Stairs 4   x 2   Height of Stairs 6            Neuro Re-education:  Reviewed HEP from last visit:  Pt with initial reminder cues, but performs well.   Side Stepping with Counter Support - 1-2 x daily - 5 x weekly - 2 sets - 5 reps Lateral Weight Shift - 1-2 x daily - 5 x weekly - 2 sets - 10 reps Alternating Step Backward with Support - 1-2 x daily - 5 x weekly - 2 sets - 5 reps    Balance Exercises - 09/08/20 0001      Balance Exercises: Standing   Standing Eyes Opened Wide (BOA);Head turns;5 reps;Solid surface   Head nods; UE support at counter   SLS with Vectors Solid surface;Upper extremity assist 2;Other reps (comment);Limitations    SLS with Vectors Limitations Alt forward step taps to 4" cabinet shelf in front of patient, BUE support at counter, 2 sets x 10    Heel Raises Both;10 reps    Toe Raise 10 reps    Other Standing Exercises Stagger stance weightshifting x 10 reps at counter; initial cues for technique and foot position, then pt able to conitnue to finish set each leg position.               PT Short Term Goals - 08/25/20 1120      PT SHORT TERM GOAL #1   Title Pt will perform HEP with family supervision, for improved balance, stregnth, transfers, and gait.  TARGET 08/14/2020    Baseline Not currently performing HEP, due to unplanned eye surgery    Time 5    Period Weeks    Status Not Met      PT SHORT TERM GOAL #2   Title Pt will improve 5x sit<>stand to less than  or equal to 20 seconds for improved transfer efficiency and functional strength.    Baseline 08/10/20-23.66 seconds    Time 5    Period Weeks    Status Not Met      PT SHORT TERM GOAL #3   Title Berg score to be assessed, with Berg score improved  by at least 5 points for decreased fall risk.    Baseline 28/56 on 08/10/20    Time 5    Period Weeks    Status Achieved      PT SHORT TERM GOAL #4   Title Pt will improve TUG score to less than or equal to 20 seconds for decreased fall risk.    Baseline time increased to 34.72 on 08/10/20   performed at end of session and pt reports fatigue and unwilling to perform 2nd attempts   Time 5    Period Weeks    Status Not Met      PT SHORT TERM GOAL #5   Title Pt/husband will verbalize understanding of fall prevention in home environment.    Time 5    Period Weeks    Status Achieved             PT Long Term Goals - 09/08/20 1053      PT LONG TERM GOAL #1   Title Pt will perform progression of HEP, including verbalizing plans for optimal fitness plan upon d/c from PT.  TARGET 09/18/2020 (updated date to reflect week remaining in POC)    Time 9    Period Weeks    Status New      PT LONG TERM GOAL #2   Title Pt will improve 5x sit<>stand in less than or equal to 15 seconds for improved transfer efficiency and functional strength.    Time 9    Period Weeks    Status New      PT LONG TERM GOAL #3   Title Pt will improve gait velocity to at least 1.8 ft/sec for improved gait efficiency and safety.    Baseline 1.27 ft/sec    Time 9    Period Weeks    Status New      PT LONG TERM GOAL #4   Title Pt will improve TUG score to less than or equal to 15 seconds for decreased fall risk.    Time 9    Period Weeks    Status New      PT LONG TERM GOAL #5   Title Pt will ambulate at least 215 ft in 2 minute walk test, for improved gait efficiency and safety.    Baseline 175 ft    Time 9    Period Weeks    Status New      PT LONG  TERM GOAL #6   Title Pt will verbalize understanding of local Parkinson's disease resources.    Time 9    Period Weeks    Status New                 Plan - 09/08/20 1050    Clinical Impression Statement Pt appears to need less rest breaks today during standing exercises and only needs 1 standing "reset" break with gait with rollator over 230 ft of gait.  Husband present again and PT reviewed HEP and educated/reinforced optimal technique for stair negotiation at home.  Modified LTG dates to reflect that next week is week 9 in POC, due to pt being out for eye surgery.  She will continue to benefit from further skilled PT to address balance, strength, gait for improved funcitonal mobility.    Personal Factors and Comorbidities Comorbidity 3+    Comorbidities PMH:  HTN, DM, hypothyroidism, orthostatic hypotension    Examination-Activity Limitations Locomotion Level;Stand;Transfers    Examination-Participation Restrictions Community Activity;Other   Plans for travel in  retirement   Stability/Clinical Decision Making Evolving/Moderate complexity    Rehab Potential Good    PT Frequency 2x / week    PT Duration Other (comment)   9 weeks   PT Treatment/Interventions ADLs/Self Care Home Management;DME Instruction;Neuromuscular re-education;Balance training;Therapeutic exercise;Therapeutic activities;Functional mobility training;Gait training;Patient/family education;Manual techniques    PT Next Visit Plan No restrictions per eye surgeron.  Week of 12/27 is week 9 of 9 in POC, so LTGs/recert need to be checked/completed next week.  Continue standing exercises at countertop. functional strengthening/step ups and stair training    Consulted and Agree with Plan of Care Patient           Patient will benefit from skilled therapeutic intervention in order to improve the following deficits and impairments:  Abnormal gait,Difficulty walking,Decreased balance,Decreased mobility,Decreased  strength,Postural dysfunction  Visit Diagnosis: Other abnormalities of gait and mobility  Unsteadiness on feet     Problem List Patient Active Problem List   Diagnosis Date Noted  . Pain due to onychomycosis of toenails of both feet 06/17/2020  . Parkinson's disease ([facility]) 04/23/2020  . Leg swelling 11/26/2019  . Chronic venous insufficiency 11/26/2019  . Vitamin B12 deficiency 02/06/2018  . Sepsis secondary to UTI ([facility]) 01/29/2018  . Microcytic anemia 01/29/2018  . Renal insufficiency 01/29/2018  . Pure hypercholesterolemia 12/24/2014  . Diabetes mellitus type 2, controlled, without complications ([facility]) 03/49/1791  . Bruit of right carotid artery 12/24/2014  . Thyroid disease 07/06/2014  . Severe obesity (BMI >= 40) ([facility]) 07/06/2014  . HTN (hypertension) 07/06/2014    As[facility] Belote W. 09/08/2020, 10:55 AM Frazier Butt., PT  [facility] [address] [place], [place], [address] Phone: [phone]   Fax:  [phone]  Name: Anna Bernard MRN: [id number] Date of Birth: [birth year]

## 2020-09-09 ENCOUNTER — Ambulatory Visit: Payer: Medicare Other | Admitting: Occupational Therapy

## 2020-09-09 ENCOUNTER — Ambulatory Visit: Payer: Medicare Other | Admitting: Physical Therapy

## 2020-09-09 ENCOUNTER — Encounter: Payer: Self-pay | Admitting: Physical Therapy

## 2020-09-09 DIAGNOSIS — R2681 Unsteadiness on feet: Secondary | ICD-10-CM

## 2020-09-09 DIAGNOSIS — R293 Abnormal posture: Secondary | ICD-10-CM

## 2020-09-09 DIAGNOSIS — M25611 Stiffness of right shoulder, not elsewhere classified: Secondary | ICD-10-CM

## 2020-09-09 DIAGNOSIS — R29898 Other symptoms and signs involving the musculoskeletal system: Secondary | ICD-10-CM

## 2020-09-09 DIAGNOSIS — R2689 Other abnormalities of gait and mobility: Secondary | ICD-10-CM | POA: Diagnosis not present

## 2020-09-09 DIAGNOSIS — M6281 Muscle weakness (generalized): Secondary | ICD-10-CM | POA: Diagnosis not present

## 2020-09-09 DIAGNOSIS — R29818 Other symptoms and signs involving the nervous system: Secondary | ICD-10-CM

## 2020-09-09 DIAGNOSIS — R278 Other lack of coordination: Secondary | ICD-10-CM

## 2020-09-09 DIAGNOSIS — M25621 Stiffness of right elbow, not elsewhere classified: Secondary | ICD-10-CM | POA: Diagnosis not present

## 2020-09-09 NOTE — Therapy (Signed)
[facility] [address] [place], [place], [address] Phone: [phone]   Fax:  [phone]  Occupational Therapy Treatment  Patient Details  Name: Anna Bernard MRN: [id number] Date of Birth: Jan 23, 1951 Referring Provider (OT): Dr. Wells Guiles Tat   Encounter Date: 09/09/2020   OT End of Session - 09/09/20 1012    Visit Number 10    Number of Visits 17    Date for OT Re-Evaluation 09/11/20    Authorization Type Medicare UHC:  no visit limit or auth required    Authorization Time Period cert. date 07/13/20-10/11/20    Authorization - Visit Number 10    Authorization - Number of Visits 10    Progress Note Due on Visit 10    OT Start Time 1016    OT Stop Time 1100    OT Time Calculation (min) 44 min    Activity Tolerance Patient tolerated treatment well    Behavior During Therapy WFL for tasks assessed/performed           Past Medical History:  Diagnosis Date  . Anemia, unspecified   . Hyperlipidemia, unspecified   . Hypertension   . Hypothyroidism, unspecified   . Pre-diabetes   . Vitamin B12 deficiency     Past Surgical History:  Procedure Laterality Date  . ABDOMINAL HYSTERECTOMY    . GIVENS CAPSULE STUDY N/A 04/16/2018   Procedure: GIVENS CAPSULE STUDY;  Surgeon: Carol Ada, MD;  Location: [place];  Service: Endoscopy;  Laterality: N/A;  . PARS PLANA VITRECTOMY Right 08/16/2020   Procedure: 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;  Surgeon: Jalene Mullet, MD;  Location: [place];  Service: Ophthalmology;  Laterality: Right;  . PHOTOCOAGULATION WITH LASER Right 08/16/2020   Procedure: PHOTOCOAGULATION WITH LASER;  Surgeon: Jalene Mullet, MD;  Location: [facility];  Service: Ophthalmology;  Laterality: Right;  . SHOULDER SURGERY    . TONSILLECTOMY    . TUBAL LIGATION      There were no vitals filed for this visit.   Subjective Assessment - 09/09/20 1012     Subjective  Pt reports that she will ask MD about foot tomorrow.  "I need to work on this more"    Patient is accompanied by: Family member    Pertinent History newly diagnosed PD (in last month).   PMH:  anemia, hyperlipidemia, HTN, hypothyroidism, Vitamin B12 deficiency, hx of R RTC surgery, leg swelling, arthritis (per pt), obesity, orthostatic hypotension    Patient Stated Goals be able to move better and travel (to [place] first)    Currently in Pain? Yes    Pain Score 4     Pain Location Foot    Pain Orientation Right    Pain Descriptors / Indicators Aching    Pain Type Chronic pain    Pain Onset More than a month ago    Pain Frequency Constant    Aggravating Factors  standing, walking too much    Pain Relieving Factors medicine              Table slides for shoulder flex and abduction with each UE for stretch in trunk, forward wt. Shift, and elbow ext with min cueing.  Placing small pegs in pegboard with each hand with use of PWR! Hands with min v.c.  Pt with no significant difficulty with L hand and min-mod difficulty with R hand.  Practiced buttoning/unbuttoning shirt on table top with min cues for use of PWR! Hands prior  to buttoning and use of deliberate/large amplitude movements for fastening and push-pull technique for unfastening after instruction.  Pt demo improvement with repetition and min-mod cues for use of large amplitude movements.  However, initially noted that pt with decr use of R hand--this improved with repetition and cueing.  Instructed pt in strategies for eating including holding utensil in the middle vs. The end, use of spoon vs. Fork, use of PWR! Hands.   Also strongly encouraged pt to force the use of RUE for eating to decr risk of future complications.  Pt verbalized understanding.  Cleaning hands with hand sanitizer with min cues to incr use of RUE.  Pt instructed to observe for tasks that she is not using RUE and try to force use.  Pt instructed that if it is  difficult to use both hands together for bilateral tasks like washing hair or hands that pt should try to initiate the movement with RUE.  Pt able to demo improved RUE use for cleaning hands after instruction.  Pt encouraged to try to incr awareness of use of RUE and try to force use during the day to decr risk of complications.              OT Short Term Goals - 09/09/20 1229      OT SHORT TERM GOAL #1   Title Pt will be independent with PD-specific HEP--check STGs 09/11/20    Time 4    Period Weeks    Status On-going   09/01/20:  needs cueing/reinforcement     OT SHORT TERM GOAL #2   Title Pt will demo at least 110* R shoulder flex with -40* elbow ext for functional reach.    Baseline 100* with -45* elbow ext    Time 4    Period Weeks    Status Achieved   09/01/20:  110* with -30* elbow ext     OT SHORT TERM GOAL #3   Title Pt will improve R hand coordination for ADLs as shown by improving time on 9-hole peg test by at least 6sec.    Baseline 56.84sec    Time 4    Period Weeks    Status On-going   09/08/20:  61sec     OT SHORT TERM GOAL #4   Title Pt willdemonstrate improved ease with dressing as evidenced by  performing  PPT#4 donning/ doffing jacket in 1 min 15 secs    Baseline 1 min 29 secs    Time 4    Period Weeks    Status On-going   09/08/20:  >77mn     OT SHORT TERM GOAL #5   Title Pt will verbalize understanding of appropriate community resources and ways to decr risk of future complications.    Time 4    Period Weeks    Status On-going   09/08/20:  not fully met (began education regarding ways to decr future complications), continue goal            OT Long Term Goals - 09/09/20 1231      OT LONG TERM GOAL #1   Title Pt will verbalize understanding of adaptive strategies to incr ease, safety, and independence with ADLs and IADLs.--check LTGs 10/11/20    Time 8    Period Weeks    Status New      OT LONG TERM GOAL #2   Title Pt will demo at least  115* R shoulder flex with -35* elbow ext for functional reach.  Baseline 100* with -45* elbow ext    Time 8    Period Weeks    Status New      OT LONG TERM GOAL #3   Title Pt will perform UB dressing consistently mod I.    Time 8    Period Weeks    Status New      OT LONG TERM GOAL #4   Title Pt will perform LB dressing consistently mod I.    Time 8    Period Weeks    Status New      OT LONG TERM GOAL #5   Title Pt will improve R hand coordination for ADLs as shown by improving time on 9-hole peg test by at least 10sec.    Baseline 56.84sec    Time 8    Period Weeks    Status New      OT LONG TERM GOAL #6   Title Pt will improved RUE functional reaching and coordination for ADLs as shown by improving score on box and blocks test by at least 5 blocks.    Baseline R-31 blocks, L-40 blocks    Time 8    Period Weeks    Status New                 Plan - 09/09/20 1013    Clinical Impression Statement Progress Note Reporting Period  07/13/20-09/09/20:  Pt is progressing slowly towards goals but demo improved movement quality/ease with use of large amplitude movement strategies and decr rigidity in RUE.  Pt will need consistent repetition and reinforcement in therapy and at home (by husband).  Pt also encouraged to try to do as much as she can for herself prior to asking for help and incr use of RUE as pt appears to demo mild learned non-use. Pt would benefit from continued occupational therapy to address bradykinesia, rigidity, posture, functional mobility, and coordination to incr UE functional use, incr independence in ADLs and participation in IADLs, and decr risk of future complications.    OT Occupational Profile and History Detailed Assessment- Review of Records and additional review of physical, cognitive, psychosocial history related to current functional performance    Occupational performance deficits (Please refer to evaluation for details): ADL's;IADL's;Social  Participation;Leisure    Body Structure / Function / Physical Skills ADL;Balance;Coordination;Decreased knowledge of use of DME;GMC;UE functional use;FMC;Tone;Mobility;Flexibility;IADL;ROM;Dexterity;Body mechanics    Rehab Potential Good    OT Frequency 2x / week    OT Duration 8 weeks    OT Treatment/Interventions Self-care/ADL training;Moist Heat;Aquatic Therapy;Therapeutic activities;Balance training;DME and/or AE instruction;Therapeutic exercise;Passive range of motion;Functional Mobility Training;Neuromuscular education;Cryotherapy;Manual Therapy;Energy conservation;Patient/family education;Ultrasound    Plan ADL strategies/large amplitude movement strategies    Consulted and Agree with Plan of Care Patient;Family member/caregiver    Family Member Consulted husband           Patient will benefit from skilled therapeutic intervention in order to improve the following deficits and impairments:   Body Structure / Function / Physical Skills: ADL,Balance,Coordination,Decreased knowledge of use of DME,GMC,UE functional use,FMC,Tone,Mobility,Flexibility,IADL,ROM,Dexterity,Body mechanics       Visit Diagnosis: Other symptoms and signs involving the nervous system  Other symptoms and signs involving the musculoskeletal system  Other lack of coordination  Abnormal posture  Stiffness of right elbow, not elsewhere classified  Stiffness of right shoulder, not elsewhere classified  Unsteadiness on feet  Other abnormalities of gait and mobility    Problem List Patient Active Problem List   Diagnosis Date Noted  .  Pain due to onychomycosis of toenails of both feet 06/17/2020  . Parkinson's disease ([place]) 04/23/2020  . Leg swelling 11/26/2019  . Chronic venous insufficiency 11/26/2019  . Vitamin B12 deficiency 02/06/2018  . Sepsis secondary to UTI ([place]) 01/29/2018  . Microcytic anemia 01/29/2018  . Renal insufficiency 01/29/2018  . Pure hypercholesterolemia 12/24/2014  .  Diabetes mellitus type 2, controlled, without complications ([place]) 43/15/4008  . Bruit of right carotid artery 12/24/2014  . Thyroid disease 07/06/2014  . Severe obesity (BMI >= 40) ([place]) 07/06/2014  . HTN (hypertension) 07/06/2014    [facility] 09/09/2020, 12:40 PM  [facility] [address] [place], [place], [address] Phone: [phone]   Fax:  [phone]  Name: Anna Bernard MRN: [id number] Date of Birth: [birth year]   Vianne Bulls, OTR/L [facility] [address] [place], [place]  [address] [phone] phone [phone] 09/09/20 12:40 PM

## 2020-09-09 NOTE — Therapy (Signed)
[facility] [address] [place], [place], [address] Phone: [phone]   Fax:  [phone]  Physical Therapy Treatment  Patient Details  Name: Anna Bernard MRN: [id number] Date of Birth: March 28, 1951 Referring Provider (PT): Alonza Bogus, DO   Encounter Date: 09/09/2020   PT End of Session - 09/09/20 1158    Visit Number 15    Number of Visits 18    Date for PT Re-Evaluation 09/73/[birth year]   90 day cert for 9 wk POC   Authorization Type UHC Medicare    Progress Note Due on Visit 20    PT Start Time 1103    PT Stop Time 1143    PT Time Calculation (min) 40 min    Equipment Utilized During Treatment Gait belt    Activity Tolerance Patient tolerated treatment well    Behavior During Therapy [facility] for tasks assessed/performed;Flat affect           Past Medical History:  Diagnosis Date  . Anemia, unspecified   . Hyperlipidemia, unspecified   . Hypertension   . Hypothyroidism, unspecified   . Pre-diabetes   . Vitamin B12 deficiency     Past Surgical History:  Procedure Laterality Date  . ABDOMINAL HYSTERECTOMY    . GIVENS CAPSULE STUDY N/A 04/16/2018   Procedure: GIVENS CAPSULE STUDY;  Surgeon: Carol Ada, MD;  Location: [place];  Service: Endoscopy;  Laterality: N/A;  . PARS PLANA VITRECTOMY Right 08/16/2020   Procedure: 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;  Surgeon: Jalene Mullet, MD;  Location: [place] [facility];  Service: Ophthalmology;  Laterality: Right;  . PHOTOCOAGULATION WITH LASER Right 08/16/2020   Procedure: PHOTOCOAGULATION WITH LASER;  Surgeon: Jalene Mullet, MD;  Location: [place];  Service: Ophthalmology;  Laterality: Right;  . SHOULDER SURGERY    . TONSILLECTOMY    . TUBAL LIGATION      There were no vitals filed for this visit.   Subjective Assessment - 09/09/20 1108    Subjective Is going to the doctor tomorrow and will ask about her foot pain. Exercises are  going well at home.    Patient is accompained by: Family member   Anna Bernard, Anna Bernard   Pertinent History Dx with Parkinson's disease (Dr. Carles Collet) 04/2020 and Sinemet started; PMH of HTN, DM, hypothyroidism, orthostatic hypotension    Patient Stated Goals Get my walk back, be able to do things again.    Currently in Pain? Yes    Pain Score 4     Pain Location Foot    Pain Orientation Right    Pain Descriptors / Indicators Aching    Pain Type Chronic pain    Pain Onset More than a month ago    Aggravating Factors  standing, walking too much    Pain Relieving Factors medicine                             [facility] Adult PT Treatment/Exercise - 09/09/20 1110      Transfers   Sit to Stand 5: Supervision;With upper extremity assist;From bed;From chair/3-in-1    Sit to Stand Details Verbal cues for sequencing;Verbal cues for technique    Sit to Stand Details (indicate cue type and reason) initial cues to scoot towards the edge, took incr time today    Stand to Sit 5: Supervision;To chair/3-in-1;With upper extremity assist;To bed    Stand to Sit Details (indicate cue type and reason)  Verbal cues for sequencing;Verbal cues for technique      Ambulation/Gait   Ambulation/Gait Yes    Ambulation/Gait Assistance 5: Supervision    Ambulation/Gait Assistance Details initial cues for incr step length. pt needing one instance to stop and reset due to rollator getting too far anteriorly    Ambulation Distance (Feet) 80 Feet   x3, 100 x 1   Assistive device Rollator    Gait Pattern Step-through pattern;Decreased step length - right;Decreased step length - left;Decreased stance time - right;Decreased stance time - left;Decreased dorsiflexion - right;Decreased dorsiflexion - left;Shuffle;Poor foot clearance - left;Poor foot clearance - right    Ambulation Surface Level;Indoor    Stairs Yes    Stairs Assistance 4: Min guard    Stairs Assistance Details (indicate cue type and reason) reviewed proper  technique with pt today, incr time when descending and more fearful    Stair Management Technique Two rails;Step to pattern;Forwards   cues for up with good LLE and descending with weaker RLE   Number of Stairs 4   x2   Height of Stairs 6               Balance Exercises - 09/09/20 0001      Balance Exercises: Standing   Standing Eyes Opened Wide (BOA);Head turns;5 reps;Solid surface;Limitations    Standing Eyes Opened Limitations x5 reps head turns and nods with BUE support at rollator, then performed same activity with no UE support with min guard for balance    SLS with Vectors Solid surface;Upper extremity assist 2;Other reps (comment);Limitations    SLS with Vectors Limitations alternating step taps at bottom of stair case to 6" step x10 reps    Stepping Strategy Lateral;UE support;10 reps;Limitations;Posterior   10 reps each direction   Stepping Strategy Limitations cues for incr step length and weight shift, more so in lateral direction    Sidestepping Upper extremity support;3 reps;Limitations    Sidestepping Limitations at countertop down and back 3 reps, cues for larger steps    Heel Raises Both;10 reps   BUE support at counter   Toe Raise 10 reps   BUE support at counter   Other Standing Exercises at staircase: step up up and down down x5 reps B with BUE support               PT Short Term Goals - 08/25/20 1120      PT SHORT TERM GOAL #1   Title Pt will perform HEP with family supervision, for improved balance, stregnth, transfers, and gait.  TARGET 08/14/2020    Baseline Not currently performing HEP, due to unplanned eye surgery    Time 5    Period Weeks    Status Not Met      PT SHORT TERM GOAL #2   Title Pt will improve 5x sit<>stand to less than or equal to 20 seconds for improved transfer efficiency and functional strength.    Baseline 08/10/20-23.66 seconds    Time 5    Period Weeks    Status Not Met      PT SHORT TERM GOAL #3   Title Berg score to  be assessed, with Berg score improved by at least 5 points for decreased fall risk.    Baseline 28/56 on 08/10/20    Time 5    Period Weeks    Status Achieved      PT SHORT TERM GOAL #4   Title Pt will improve TUG score to less than  or equal to 20 seconds for decreased fall risk.    Baseline time increased to 34.72 on 08/10/20   performed at end of session and pt reports fatigue and unwilling to perform 2nd attempts   Time 5    Period Weeks    Status Not Met      PT SHORT TERM GOAL #5   Title Pt/Anna Bernard will verbalize understanding of fall prevention in home environment.    Time 5    Period Weeks    Status Achieved             PT Long Term Goals - 09/08/20 1053      PT LONG TERM GOAL #1   Title Pt will perform progression of HEP, including verbalizing plans for optimal fitness plan upon d/c from PT.  TARGET 09/18/2020 (updated date to reflect week remaining in POC)    Time 9    Period Weeks    Status New      PT LONG TERM GOAL #2   Title Pt will improve 5x sit<>stand in less than or equal to 15 seconds for improved transfer efficiency and functional strength.    Time 9    Period Weeks    Status New      PT LONG TERM GOAL #3   Title Pt will improve gait velocity to at least 1.8 ft/sec for improved gait efficiency and safety.    Baseline 1.27 ft/sec    Time 9    Period Weeks    Status New      PT LONG TERM GOAL #4   Title Pt will improve TUG score to less than or equal to 15 seconds for decreased fall risk.    Time 9    Period Weeks    Status New      PT LONG TERM GOAL #5   Title Pt will ambulate at least 215 ft in 2 minute walk test, for improved gait efficiency and safety.    Baseline 175 ft    Time 9    Period Weeks    Status New      PT LONG TERM GOAL #6   Title Pt will verbalize understanding of local Parkinson's disease resources.    Time 9    Period Weeks    Status New                 Plan - 09/09/20 1200    Clinical Impression Statement  Reviewed stair negotiation today with pt, pt needing min guard and takes incr time when descending stairs with step to pattern. Remainder of session focused on gait training and standing balance exercises at the countertop/rollator. Pt needing minimal seated rest breaks. Will continue to progress towards LTGs.    Personal Factors and Comorbidities Comorbidity 3+    Comorbidities PMH:  HTN, DM, hypothyroidism, orthostatic hypotension    Examination-Activity Limitations Locomotion Level;Stand;Transfers    Examination-Participation Restrictions Community Activity;Other   Plans for travel in retirement   Stability/Clinical Decision Making Evolving/Moderate complexity    Rehab Potential Good    PT Frequency 2x / week    PT Duration Other (comment)   9 weeks   PT Treatment/Interventions ADLs/Self Care Home Management;DME Instruction;Neuromuscular re-education;Balance training;Therapeutic exercise;Therapeutic activities;Functional mobility training;Gait training;Patient/family education;Manual techniques    PT Next Visit Plan No restrictions per eye surgeron.  Week of 12/27 is week 9 of 9 in POC, so LTGs/recert need to be checked/completed next week.  Continue standing exercises at countertop. functional strengthening/step  ups and stair training. add standing balance to HEP?    Consulted and Agree with Plan of Care Patient           Patient will benefit from skilled therapeutic intervention in order to improve the following deficits and impairments:  Abnormal gait,Difficulty walking,Decreased balance,Decreased mobility,Decreased strength,Postural dysfunction  Visit Diagnosis: Other symptoms and signs involving the nervous system  Other symptoms and signs involving the musculoskeletal system  Abnormal posture  Unsteadiness on feet  Muscle weakness (generalized)     Problem List Patient Active Problem List   Diagnosis Date Noted  . Pain due to onychomycosis of toenails of both feet  06/17/2020  . Parkinson's disease ([place]) 04/23/2020  . Leg swelling 11/26/2019  . Chronic venous insufficiency 11/26/2019  . Vitamin B12 deficiency 02/06/2018  . Sepsis secondary to UTI ([place]) 01/29/2018  . Microcytic anemia 01/29/2018  . Renal insufficiency 01/29/2018  . Pure hypercholesterolemia 12/24/2014  . Diabetes mellitus type 2, controlled, without complications ([place]) 99/77/4142  . Bruit of right carotid artery 12/24/2014  . Thyroid disease 07/06/2014  . Severe obesity (BMI >= 40) ([place]) 07/06/2014  . HTN (hypertension) 07/06/2014    Arliss Journey, PT, DPT  09/09/2020, 12:01 PM  [facility] [address] [place], [place], [address] Phone: [phone]   Fax:  [phone]  Name: Anna Bernard MRN: [id number] Date of Birth: 04/18/1951

## 2020-09-10 ENCOUNTER — Ambulatory Visit (INDEPENDENT_AMBULATORY_CARE_PROVIDER_SITE_OTHER): Payer: Medicare Other | Admitting: Family Medicine

## 2020-09-10 ENCOUNTER — Other Ambulatory Visit: Payer: Self-pay

## 2020-09-10 ENCOUNTER — Encounter: Payer: Self-pay | Admitting: Family Medicine

## 2020-09-10 VITALS — BP 142/78 | HR 91 | Temp 97.9°F | Resp 16 | Ht 61.0 in | Wt 186.0 lb

## 2020-09-10 DIAGNOSIS — Z1231 Encounter for screening mammogram for malignant neoplasm of breast: Secondary | ICD-10-CM

## 2020-09-10 DIAGNOSIS — D649 Anemia, unspecified: Secondary | ICD-10-CM | POA: Diagnosis not present

## 2020-09-10 DIAGNOSIS — I10 Essential (primary) hypertension: Secondary | ICD-10-CM | POA: Diagnosis not present

## 2020-09-10 DIAGNOSIS — K5903 Drug induced constipation: Secondary | ICD-10-CM

## 2020-09-10 DIAGNOSIS — E119 Type 2 diabetes mellitus without complications: Secondary | ICD-10-CM

## 2020-09-10 DIAGNOSIS — E039 Hypothyroidism, unspecified: Secondary | ICD-10-CM | POA: Diagnosis not present

## 2020-09-10 DIAGNOSIS — I872 Venous insufficiency (chronic) (peripheral): Secondary | ICD-10-CM

## 2020-09-10 DIAGNOSIS — Z6835 Body mass index (BMI) 35.0-35.9, adult: Secondary | ICD-10-CM

## 2020-09-10 MED ORDER — OLMESARTAN MEDOXOMIL 20 MG PO TABS: 20.0000 mg | ORAL_TABLET | Freq: Every day | ORAL | 3 refills | Status: DC

## 2020-09-10 MED ORDER — DOCUSATE SODIUM 100 MG PO CAPS: 100.0000 mg | ORAL_CAPSULE | Freq: Two times a day (BID) | ORAL | 3 refills | Status: DC | PRN

## 2020-09-10 MED ORDER — POLYETHYLENE GLYCOL 3350 17 GM/SCOOP PO POWD: 17.0000 g | Freq: Two times a day (BID) | ORAL | 3 refills | Status: DC | PRN

## 2020-09-10 NOTE — Patient Instructions (Addendum)
Take at total of 20mg  of olmesartan ( either 1 20mg  tab or 4 5 mg tabs)  For the furosemide take 2nd dose no later than 2pm.    Health Maintenance After Age 67 After age 23, you are at a higher risk for certain long-term diseases and infections as well as injuries from falls. Falls are a major cause of broken bones and head injuries in people who are older than age 84. Getting regular preventive care can help to keep you healthy and well. Preventive care includes getting regular testing and making lifestyle changes as recommended by your health care provider. Talk with your health care provider about:  Which screenings and tests you should have. A screening is a test that checks for a disease when you have no symptoms.  A diet and exercise plan that is right for you. What should I know about screenings and tests to prevent falls? Screening and testing are the best ways to find a health problem early. Early diagnosis and treatment give you the best chance of managing medical conditions that are common after age 22. Certain conditions and lifestyle choices may make you more likely to have a fall. Your health care provider may recommend:  Regular vision checks. Poor vision and conditions such as cataracts can make you more likely to have a fall. If you wear glasses, make sure to get your prescription updated if your vision changes.  Medicine review. Work with your health care provider to regularly review all of the medicines you are taking, including over-the-counter medicines. Ask your health care provider about any side effects that may make you more likely to have a fall. Tell your health care provider if any medicines that you take make you feel dizzy or sleepy.  Osteoporosis screening. Osteoporosis is a condition that causes the bones to get weaker. This can make the bones weak and cause them to break more easily.  Blood pressure screening. Blood pressure changes and medicines to control  blood pressure can make you feel dizzy.  Strength and balance checks. Your health care provider may recommend certain tests to check your strength and balance while standing, walking, or changing positions.  Foot health exam. Foot pain and numbness, as well as not wearing proper footwear, can make you more likely to have a fall.  Depression screening. You may be more likely to have a fall if you have a fear of falling, feel emotionally low, or feel unable to do activities that you used to do.  Alcohol use screening. Using too much alcohol can affect your balance and may make you more likely to have a fall. What actions can I take to lower my risk of falls? General instructions  Talk with your health care provider about your risks for falling. Tell your health care provider if: ? You fall. Be sure to tell your health care provider about all falls, even ones that seem minor. ? You feel dizzy, sleepy, or off-balance.  Take over-the-counter and prescription medicines only as told by your health care provider. These include any supplements.  Eat a healthy diet and maintain a healthy weight. A healthy diet includes low-fat dairy products, low-fat (lean) meats, and fiber from whole grains, beans, and lots of fruits and vegetables. Home safety  Remove any tripping hazards, such as rugs, cords, and clutter.  Install safety equipment such as grab bars in bathrooms and safety rails on stairs.  Keep rooms and walkways well-lit. Activity   Follow a regular exercise  program to stay fit. This will help you maintain your balance. Ask your health care provider what types of exercise are appropriate for you.  If you need a cane or walker, use it as recommended by your health care provider.  Wear supportive shoes that have nonskid soles. Lifestyle  Do not drink alcohol if your health care provider tells you not to drink.  If you drink alcohol, limit how much you have: ? 0-1 drink a day for  women. ? 0-2 drinks a day for men.  Be aware of how much alcohol is in your drink. In the [place] one drink equals one typical bottle of beer (12 oz), one-half glass of wine (5 oz), or one shot of hard liquor (1 oz).  Do not use any products that contain nicotine or tobacco, such as cigarettes and e-cigarettes. If you need help quitting, ask your health care provider. Summary  Having a healthy lifestyle and getting preventive care can help to protect your health and wellness after age 62.  Screening and testing are the best way to find a health problem early and help you avoid having a fall. Early diagnosis and treatment give you the best chance for managing medical conditions that are more common for people who are older than age 1.  Falls are a major cause of broken bones and head injuries in people who are older than age 38. Take precautions to prevent a fall at home.  Work with your health care provider to learn what changes you can make to improve your health and wellness and to prevent falls. This information is not intended to replace advice given to you by your health care provider. Make sure you discuss any questions you have with your health care provider. Document Revised: 12/27/2018 Document Reviewed: 07/19/2017 Elsevier Patient Education  [employer].  If you have lab work done today you will be contacted with your lab results within the next 2 weeks.  If you have not heard from [place] then please contact us. The fastest way to get your results is to register for My Chart.   IF you received an x-ray today, you will receive an invoice from [facility]. Please contact [facility] at [phone] with questions or concerns regarding your invoice.   IF you received labwork today, you will receive an invoice from [facility]. Please contact [facility] at [phone] with questions or concerns regarding your invoice.   Our billing staff will not be able to assist you  with questions regarding bills from these companies.  You will be contacted with the lab results as soon as they are available. The fastest way to get your results is to activate your My Chart account. Instructions are located on the last page of this paperwork. If you have not heard from [place] regarding the results in 2 weeks, please contact this office.

## 2020-09-10 NOTE — Progress Notes (Signed)
12/23/202110:47 AM  [name] [birth year], 69 y.o., female [id number]  Chief Complaint  Patient presents with  . Establish Care    Pt establishing care pt reports some spells of SOB with exertion otherwise doing okay    HPI:   Patient is a 69 y.o. female with past medical history significant for parkisons, dizziness,HTN, HLP,diabetes,microcystic anemia,vitamin B12 deficiencyandhypothyroidismwho presents today forroutine follow-up.  Has been short of breath for the past 3 months Has been going to rehab, this helps this Not sleeping very well 11/27: ED/surgery: hemorrhagic retinal detachment right eye Still has cloudy vision on the right eye, vision improves throughout the day Has followed up since then  Hypothyroidism Levothyroxine daily 150 mcg Lab Results  Component Value Date   TSH 3.170 06/18/2020   Hyperlipidemia Crestor 10mg  daily Lab Results  Component Value Date   CHOL 146 03/30/2020   HDL 58 03/30/2020   LDLCALC 75 03/30/2020   TRIG 61 03/30/2020   CHOLHDL 2.5 03/30/2020   HTN Furosemide 20mg  bid Kcl 20 meq Benicar 10mg  daily (max 40 inc to 20) Takes BP at home Has been high at home BP Readings from Last 3 Encounters:  09/10/20 (!) 142/78  08/16/20 (!) 165/70  08/15/20 (!) 172/87   Parkinson's disease (HCC) Managed by neuro: carbidopa-levodopa 25-100  Chronic venous insufficiency Discussed importance of daily use of compression stockings  Screenings Mammogram: 08/09/19 [facility] 1 (ordred) Colonoscopy: 03/21/18, due 2029 Smoker: Quit 40 years ago  Lab Results  Component Value Date   HGBA1C 6.3 (A) 12/30/2019    Depression screen [facility] 2/9 09/10/2020 06/18/2020 04/17/2020  Decreased Interest 0 0 0  Down, Depressed, Hopeless 0 0 0  PHQ - 2 Score 0 0 0    Fall Risk  09/10/2020 06/18/2020 04/23/2020 04/17/2020 03/30/2020  Falls in the past year? 0 0 0 0 0  Number falls in past yr: 0 0 0 0 0  Injury with Fall? 0 0 0 0 0  Risk for fall due  to : No Fall Risks - - - -  Follow up Falls evaluation completed Falls evaluation completed - Falls evaluation completed Falls evaluation completed     No Known Allergies  Prior to Admission medications   Medication Sig Start Date End Date Taking? Authorizing Provider  aspirin 81 MG tablet Take 81 mg by mouth daily.   Yes [provider]  carbidopa-levodopa (SINEMET IR) 25-100 MG tablet Take 1 tablet by mouth 3 (three) times daily. 7am/11am/4pm 04/23/20  Yes Tat, 04/19/2020, DO  [facility] B-12 500 MCG tablet TAKE 1 TABLET (500 MCG TOTAL) BY MOUTH DAILY. Patient taking differently: Take 500 mcg by mouth daily. 04/30/18  Yes 06/23/20, Octaviano Batty, MD  furosemide (LASIX) 20 MG tablet TAKE 1 TABLET BY MOUTH TWICE A DAY 03/14/20  Yes Lezlie Lye, Irma M, MD  KLOR-CON M20 20 MEQ tablet TAKE 1 TABLET BY MOUTH TWICE A DAY Patient taking differently: Take 20 mEq by mouth. 03/14/20  Yes Lezlie Lye, 08-06-1971, MD  levothyroxine (SYNTHROID) 150 MCG tablet Take 1 tablet (150 mcg total) by mouth daily. 04/16/20  Yes Lezlie Lye, Meda Coffee, MD  Multiple Vitamin (MULTIVITAMIN WITH MINERALS) TABS Take 1 tablet by mouth daily.   Yes [provider]  naproxen sodium (ALEVE) 220 MG tablet Take 220 mg by mouth 2 (two) times daily as needed (pain).   Yes [provider]  nystatin (MYCOSTATIN/NYSTOP) powder Apply 1 application topically 2 (two) times daily. Patient taking differently: Apply  1 application topically daily as needed. 04/17/20  Yes Lezlie Lye, Meda Coffee, MD  olmesartan (BENICAR) 5 MG tablet Take 2 tablets (10 mg total) by mouth daily. 06/18/20  Yes Lezlie Lye, Meda Coffee, MD  rosuvastatin (CRESTOR) 10 MG tablet Take 1 tablet (10 mg total) by mouth daily. 06/18/20  Yes Lezlie Lye, Meda Coffee, MD  trimethoprim (TRIMPEX) 100 MG tablet Take 100 mg by mouth daily.  10/04/18  Yes [provider]    Past Medical History:  Diagnosis Date  . Anemia, unspecified   . Hyperlipidemia,  unspecified   . Hypertension   . Hypothyroidism, unspecified   . Pre-diabetes   . Vitamin B12 deficiency     Past Surgical History:  Procedure Laterality Date  . ABDOMINAL HYSTERECTOMY    . GIVENS CAPSULE STUDY N/A 04/16/2018   Procedure: GIVENS CAPSULE STUDY;  Surgeon: Jeani Hawking, MD;  Location[facility] ENDOSCOPY;  Service: Endoscopy;  Laterality: N/A;  . PARS PLANA VITRECTOMY Right 08/16/2020   Procedure: 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;  Surgeon: Carmela Rima, MD;  Location[facility] OR;  Service: Ophthalmology;  Laterality: Right;  . PHOTOCOAGULATION WITH LASER Right 08/16/2020   Procedure: PHOTOCOAGULATION WITH LASER;  Surgeon: Carmela Rima, MD;  Location: [facility] OR;  Service: Ophthalmology;  Laterality: Right;  . SHOULDER SURGERY    . TONSILLECTOMY    . TUBAL LIGATION      Social History   Tobacco Use  . Smoking status: Former Games developer  . Smokeless tobacco: Never Used  . Tobacco comment: quit 30+ years ago  Substance Use Topics  . Alcohol use: No    Alcohol/week: 0.0 standard drinks    Family History  Problem Relation Age of Onset  . Renal Disease Sister   . Cancer Sister     Review of Systems  Constitutional: Positive for malaise/fatigue. Negative for chills and fever.  Eyes: Positive for blurred vision. Negative for double vision.  Respiratory: Positive for shortness of breath (with activity). Negative for cough and wheezing.   Cardiovascular: Negative for chest pain, palpitations and leg swelling.  Gastrointestinal: Positive for constipation. Negative for abdominal pain, blood in stool, diarrhea, heartburn, nausea and vomiting.  Genitourinary: Positive for frequency. Negative for dysuria, hematuria and urgency.       Incontinence episodes, wears depends and is on furosemide  Musculoskeletal: Negative for back pain and joint pain.  Skin: Negative for rash.  Neurological: Positive for weakness. Negative for dizziness and  headaches.  Psychiatric/Behavioral: The patient has insomnia.    OBJECTIVE:  Today's Vitals   09/10/20 0953 09/10/20 0956  BP: (!) 150/81 (!) 142/78  Pulse: 91   Resp: 16   Temp: 97.9 F (36.6 C)   TempSrc: Temporal   SpO2: 98%   Weight: 186 lb (84.4 kg)   Height: 5\' 1"  (1.549 m)    Body mass index is 35.14 kg/m.   Physical Exam Constitutional:      General: She is not in acute distress.    Appearance: Normal appearance. She is not ill-appearing.  HENT:     Head: Normocephalic.  Cardiovascular:     Rate and Rhythm: Normal rate and regular rhythm.     Pulses: Normal pulses.     Heart sounds: Normal heart sounds. No murmur heard. No friction rub. No gallop.   Pulmonary:     Effort: Pulmonary effort is normal. No respiratory distress.     Breath sounds: Normal breath sounds. No stridor. No wheezing, rhonchi or  rales.  Abdominal:     General: Bowel sounds are normal.     Palpations: Abdomen is soft.     Tenderness: There is no abdominal tenderness.  Musculoskeletal:     Right lower leg: Edema (Right foot swelling) present.     Left lower leg: No edema.  Skin:    General: Skin is warm and dry.  Neurological:     Mental Status: She is alert and oriented to person, place, and time.  Psychiatric:        Mood and Affect: Mood normal.        Behavior: Behavior normal.     No results found for this or any previous visit (from the past 24 hour(s)).  No results found.   ASSESSMENT and PLAN  Problem List Items Addressed This Visit      Cardiovascular and Mediastinum   HTN (hypertension)   Relevant Medications   olmesartan (BENICAR) 20 MG tablet  Increasing from 10 mg to 20mg    Chronic venous insufficiency   Relevant Medications   olmesartan (BENICAR) 20 MG tablet  Continue with compression socks and elevate legs as able     Endocrine   Diabetes mellitus type 2, controlled, without complications (HCC)   Relevant Medications   olmesartan (BENICAR) 20 MG  tablet   Other Relevant Orders   Hemoglobin A1c    Other Visit Diagnoses    Hypothyroidism, unspecified type    -  Primary   Relevant Orders   TSH   Anemia, unspecified type       Relevant Orders   CBC with Differential   Iron, TIBC and Ferritin Panel   Severe obesity (BMI 35.0-35.9 with comorbidity) (HCC)       Screening mammogram for breast cancer       Relevant Orders   MS DIGITAL SCREENING TOMO BILATERAL   Drug-induced constipation       Relevant Medications   docusate sodium (COLACE) 100 MG capsule   polyethylene glycol powder (GLYCOLAX/MIRALAX) 17 GM/SCOOP powder  Fatigued Discussed taking 2nd dose of lasix earlier Continue with rehab Will follow up     Will follow up with lab results  Return in about 3 months (around 12/09/2020).    12/11/2020 Janaiah Vetrano, FNP-BC Primary [facility] at [facility] [address] [place], [place] Ph.  [phone] Fax [phone]

## 2020-09-11 LAB — HEMOGLOBIN A1C
Est. average glucose Bld gHb Est-mCnc: 111 mg/dL
Hgb A1c MFr Bld: 5.5 % (ref 4.8–5.6)

## 2020-09-11 LAB — CBC WITH DIFFERENTIAL/PLATELET
Basophils Absolute: 0 10*3/uL (ref 0.0–0.2)
Basos: 1 %
EOS (ABSOLUTE): 0.1 10*3/uL (ref 0.0–0.4)
Eos: 2 %
Hematocrit: 30.1 % — ABNORMAL LOW (ref 34.0–46.6)
Hemoglobin: 9.6 g/dL — ABNORMAL LOW (ref 11.1–15.9)
Immature Grans (Abs): 0 10*3/uL (ref 0.0–0.1)
Immature Granulocytes: 0 %
Lymphocytes Absolute: 1.5 10*3/uL (ref 0.7–3.1)
Lymphs: 30 %
MCH: 23.5 pg — ABNORMAL LOW (ref 26.6–33.0)
MCHC: 31.9 g/dL (ref 31.5–35.7)
MCV: 74 fL — ABNORMAL LOW (ref 79–97)
Monocytes Absolute: 0.4 10*3/uL (ref 0.1–0.9)
Monocytes: 9 %
Neutrophils Absolute: 2.9 10*3/uL (ref 1.4–7.0)
Neutrophils: 58 %
Platelets: 350 10*3/uL (ref 150–450)
RBC: 4.08 x10E6/uL (ref 3.77–5.28)
RDW: 15.6 % — ABNORMAL HIGH (ref 11.7–15.4)
WBC: 5.1 10*3/uL (ref 3.4–10.8)

## 2020-09-11 LAB — IRON,TIBC AND FERRITIN PANEL
Ferritin: 13 ng/mL — ABNORMAL LOW (ref 15–150)
Iron Saturation: 10 % — ABNORMAL LOW (ref 15–55)
Iron: 34 ug/dL (ref 27–139)
Total Iron Binding Capacity: 336 ug/dL (ref 250–450)
UIBC: 302 ug/dL (ref 118–369)

## 2020-09-11 LAB — TSH: TSH: 1.39 u[IU]/mL (ref 0.450–4.500)

## 2020-09-14 ENCOUNTER — Other Ambulatory Visit: Payer: Self-pay | Admitting: Family Medicine

## 2020-09-14 DIAGNOSIS — D509 Iron deficiency anemia, unspecified: Secondary | ICD-10-CM

## 2020-09-14 MED ORDER — FERROUS SULFATE 324 MG PO TBEC: 324.0000 mg | DELAYED_RELEASE_TABLET | Freq: Every day | ORAL | 3 refills | Status: DC

## 2020-09-15 ENCOUNTER — Other Ambulatory Visit: Payer: Self-pay

## 2020-09-15 ENCOUNTER — Ambulatory Visit: Payer: Medicare Other | Admitting: Occupational Therapy

## 2020-09-15 ENCOUNTER — Ambulatory Visit: Payer: Medicare Other | Admitting: Physical Therapy

## 2020-09-15 ENCOUNTER — Encounter: Payer: Self-pay | Admitting: Physical Therapy

## 2020-09-15 DIAGNOSIS — R29898 Other symptoms and signs involving the musculoskeletal system: Secondary | ICD-10-CM

## 2020-09-15 DIAGNOSIS — R2689 Other abnormalities of gait and mobility: Secondary | ICD-10-CM

## 2020-09-15 DIAGNOSIS — R2681 Unsteadiness on feet: Secondary | ICD-10-CM

## 2020-09-15 DIAGNOSIS — M6281 Muscle weakness (generalized): Secondary | ICD-10-CM

## 2020-09-15 DIAGNOSIS — R278 Other lack of coordination: Secondary | ICD-10-CM | POA: Diagnosis not present

## 2020-09-15 DIAGNOSIS — M25611 Stiffness of right shoulder, not elsewhere classified: Secondary | ICD-10-CM

## 2020-09-15 DIAGNOSIS — R293 Abnormal posture: Secondary | ICD-10-CM | POA: Diagnosis not present

## 2020-09-15 DIAGNOSIS — R29818 Other symptoms and signs involving the nervous system: Secondary | ICD-10-CM | POA: Diagnosis not present

## 2020-09-15 DIAGNOSIS — M25621 Stiffness of right elbow, not elsewhere classified: Secondary | ICD-10-CM

## 2020-09-15 NOTE — Therapy (Signed)
[facility] [address] [place], [place], [address] Phone: [phone]   Fax:  [phone]  Physical Therapy Treatment  Patient Details  Name: GELSEY AMYX MRN: [id number] Date of Birth: 11/07/1950 Referring Provider (PT): Alonza Bogus, DO   Encounter Date: 09/15/2020   PT End of Session - 09/15/20 1250    Visit Number 16    Number of Visits 18    Date for PT Re-Evaluation 09/62/83   90 day cert for 9 wk POC   Authorization Type UHC Medicare    Progress Note Due on Visit 20    PT Start Time 1103    PT Stop Time 1146    PT Time Calculation (min) 43 min    Equipment Utilized During Treatment Gait belt    Activity Tolerance Patient tolerated treatment well    Behavior During Therapy [place] [facility] for tasks assessed/performed;Flat affect           Past Medical History:  Diagnosis Date  . Anemia, unspecified   . Hyperlipidemia, unspecified   . Hypertension   . Hypothyroidism, unspecified   . Pre-diabetes   . Vitamin B12 deficiency     Past Surgical History:  Procedure Laterality Date  . ABDOMINAL HYSTERECTOMY    . GIVENS CAPSULE STUDY N/A 04/16/2018   Procedure: GIVENS CAPSULE STUDY;  Surgeon: Carol Ada, MD;  Location: [facility];  Service: Endoscopy;  Laterality: N/A;  . PARS PLANA VITRECTOMY Right 08/16/2020   Procedure: 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;  Surgeon: Jalene Mullet, MD;  Location: [facility];  Service: Ophthalmology;  Laterality: Right;  . PHOTOCOAGULATION WITH LASER Right 08/16/2020   Procedure: PHOTOCOAGULATION WITH LASER;  Surgeon: Jalene Mullet, MD;  Location: [facility];  Service: Ophthalmology;  Laterality: Right;  . SHOULDER SURGERY    . TONSILLECTOMY    . TUBAL LIGATION      There were no vitals filed for this visit.   Subjective Assessment - 09/15/20 1108    Subjective No changes, no falls. Having some stiffness in her L shoulder.    Patient is  accompained by: Family member   [name], husband   Pertinent History Dx with Parkinson's disease (Dr. Carles Collet) 04/2020 and Sinemet started; PMH of HTN, DM, hypothyroidism, orthostatic hypotension    Patient Stated Goals Get my walk back, be able to do things again.    Currently in Pain? Yes    Pain Score 2     Pain Location Foot    Pain Orientation Right    Pain Descriptors / Indicators Aching    Pain Type Chronic pain    Pain Onset More than a month ago              [facility] PT Assessment - 09/15/20 1125      Timed Up and Go Test   Normal TUG (seconds) 53.1   with rollator   TUG Comments 45.13 seconds 2nd attempt with rollator                         [facility] Adult PT Treatment/Exercise - 09/15/20 0001      Transfers   Sit to Stand 5: Supervision;With upper extremity assist;From bed;From chair/3-in-1    Sit to Stand Details Verbal cues for sequencing;Verbal cues for technique;Visual cues for safe use of DME/AE;Visual cues/gestures for sequencing    Sit to Stand Details (indicate cue type and reason) pt takes incr time today initially, cues  to scoot out towards edge and nose over toes    Five time sit to stand comments  26.38 seconds one and on rollator and one hand pushing up    Stand to Sit 5: Supervision;To chair/3-in-1;With upper extremity assist;To bed    Stand to Sit Details (indicate cue type and reason) Verbal cues for sequencing;Verbal cues for technique    Number of Reps 2 sets;Other reps (comment)   5 reps with single hand on rollator and other hand on mat table   Transfer Cueing pt needing intermittent cues to lock rollator brakes prior to sitting down      Ambulation/Gait   Ambulation/Gait Yes    Ambulation/Gait Assistance 5: Supervision    Ambulation/Gait Assistance Details intermittent cues to stay closer to rollator    Ambulation Distance (Feet) 180 Feet   for 2MWT, plus additional distance at beginning/end of session 2 x 80'   Assistive device Rollator     Gait Pattern Step-through pattern;Decreased step length - right;Decreased step length - left;Decreased stance time - right;Decreased stance time - left;Decreased dorsiflexion - right;Decreased dorsiflexion - left;Shuffle;Poor foot clearance - left;Poor foot clearance - right    Ambulation Surface Level;Indoor    Gait Comments after 2MWT: HR at 102 bpm and O2 at 98%                  PT Education - 09/15/20 1250    Education Details progress towards goals, provided pt with handout for local PD resources, having pt's husband come back at next session to review HEP and walking program.    Person(s) Educated Patient    Methods Explanation;Handout    Comprehension Verbalized understanding            PT Short Term Goals - 08/25/20 1120      PT SHORT TERM GOAL #1   Title Pt will perform HEP with family supervision, for improved balance, stregnth, transfers, and gait.  TARGET 08/14/2020    Baseline Not currently performing HEP, due to unplanned eye surgery    Time 5    Period Weeks    Status Not Met      PT SHORT TERM GOAL #2   Title Pt will improve 5x sit<>stand to less than or equal to 20 seconds for improved transfer efficiency and functional strength.    Baseline 08/10/20-23.66 seconds    Time 5    Period Weeks    Status Not Met      PT SHORT TERM GOAL #3   Title Berg score to be assessed, with Berg score improved by at least 5 points for decreased fall risk.    Baseline 28/56 on 08/10/20    Time 5    Period Weeks    Status Achieved      PT SHORT TERM GOAL #4   Title Pt will improve TUG score to less than or equal to 20 seconds for decreased fall risk.    Baseline time increased to 34.72 on 08/10/20   performed at end of session and pt reports fatigue and unwilling to perform 2nd attempts   Time 5    Period Weeks    Status Not Met      PT SHORT TERM GOAL #5   Title Pt/husband will verbalize understanding of fall prevention in home environment.    Time 5    Period  Weeks    Status Achieved             PT Long Term Goals -  09/15/20 1120      PT LONG TERM GOAL #1   Title Pt will perform progression of HEP, including verbalizing plans for optimal fitness plan upon d/c from PT.  TARGET 09/18/2020 (updated date to reflect week remaining in POC)    Time 9    Period Weeks    Status New      PT LONG TERM GOAL #2   Title Pt will improve 5x sit<>stand in less than or equal to 15 seconds for improved transfer efficiency and functional strength.    Baseline 26.38 seconds one and on rollator and one hand pushing up    Time 9    Period Weeks    Status Not Met      PT LONG TERM GOAL #3   Title Pt will improve gait velocity to at least 1.8 ft/sec for improved gait efficiency and safety.    Baseline 1.27 ft/sec    Time 9    Period Weeks    Status New      PT LONG TERM GOAL #4   Title Pt will improve TUG score to less than or equal to 15 seconds for decreased fall risk.    Baseline 45.13 seconds with rollator on 09/15/20 2nd attempt    Time 9    Period Weeks    Status Not Met      PT LONG TERM GOAL #5   Title Pt will ambulate at least 215 ft in 2 minute walk test, for improved gait efficiency and safety.    Baseline 175 ft, 180' on 09/15/20    Time 9    Period Weeks    Status Not Met      PT LONG TERM GOAL #6   Title Pt will verbalize understanding of local Parkinson's disease resources.    Baseline provided pt with handout on 09/15/20    Time 9    Period Weeks    Status Achieved                 Plan - 09/15/20 1300    Clinical Impression Statement Began to check pt's LTGs. Pt did not meet LTG #2 and #3-4. Pt performing 5x sit <> stand today in 26.38 seconds from mat table and needing UE support. Pt did not meet LTG in regards to TUG, performed today in 45.13 seconds with rollator upon 2nd attempt (previously was 28.25 seconds). Pt taking more shuffled steps when performing turns. Pt performed 2MWT today and ambulated 180' with  rollator (previously 175') and did not meet goal level of 215'. Pt reports she has not been consistently walking at home as apart of her home program and today is a slower day for her. Will assess remaining LTGs at next session and plan to re-cert for an additional 2x week for 4 weeks to improve functional mobility and independence and decr fall risk.    Personal Factors and Comorbidities Comorbidity 3+    Comorbidities PMH:  HTN, DM, hypothyroidism, orthostatic hypotension    Examination-Activity Limitations Locomotion Level;Stand;Transfers    Examination-Participation Restrictions Community Activity;Other   Plans for travel in retirement   Stability/Clinical Decision Making Evolving/Moderate complexity    Rehab Potential Good    PT Frequency 2x / week    PT Duration Other (comment)   9 weeks   PT Treatment/Interventions ADLs/Self Care Home Management;DME Instruction;Neuromuscular re-education;Balance training;Therapeutic exercise;Therapeutic activities;Functional mobility training;Gait training;Patient/family education;Manual techniques    PT Next Visit Plan check remaining LTGs and re-cert. add sit <> Stands  to HEP.  Continue standing exercises at countertop. functional strengthening/step ups and stair training.  TUG shuttle/turn training.    Consulted and Agree with Plan of Care Patient           Patient will benefit from skilled therapeutic intervention in order to improve the following deficits and impairments:  Abnormal gait,Difficulty walking,Decreased balance,Decreased mobility,Decreased strength,Postural dysfunction  Visit Diagnosis: Other symptoms and signs involving the nervous system  Other symptoms and signs involving the musculoskeletal system  Abnormal posture  Unsteadiness on feet  Muscle weakness (generalized)     Problem List Patient Active Problem List   Diagnosis Date Noted  . Pain due to onychomycosis of toenails of both feet 06/17/2020  . Parkinson's  disease ([facility]) 04/23/2020  . Leg swelling 11/26/2019  . Chronic venous insufficiency 11/26/2019  . Vitamin B12 deficiency 02/06/2018  . Sepsis secondary to UTI ([facility]) 01/29/2018  . Microcytic anemia 01/29/2018  . Renal insufficiency 01/29/2018  . Pure hypercholesterolemia 12/24/2014  . Diabetes mellitus type 2, controlled, without complications ([facility]) 53/74/8270  . Bruit of right carotid artery 12/24/2014  . Thyroid disease 07/06/2014  . Severe obesity (BMI >= 40) ([facility]) 07/06/2014  . HTN (hypertension) 07/06/2014    Arliss Journey, PT, DPT  09/15/2020, 1:01 PM  [facility] [address] [place], [place], [address] Phone: [phone]   Fax:  [phone]  Name: MARRIA MATHISON MRN: [id number] Date of Birth: 20-Apr-1951

## 2020-09-15 NOTE — Therapy (Signed)
[facility] [address] [place], [place], [address] Phone: [phone]   Fax:  [phone]  Occupational Therapy Treatment  Patient Details  Name: Anna Bernard MRN: [id number] Date of Birth: [birth year] Referring Provider (OT): Dr. Wells Guiles Tat   Encounter Date: 09/15/2020   OT End of Session - 09/15/20 1030    Visit Number 11    Number of Visits 17    Date for OT Re-Evaluation 09/11/20    Authorization Type Medicare UHC:  no visit limit or auth required    Authorization Time Period cert. date 07/13/20-10/11/20    Authorization - Visit Number 11    Authorization - Number of Visits 10    Progress Note Due on Visit 10    OT Start Time 1021    OT Stop Time 1100    OT Time Calculation (min) 39 min    Activity Tolerance Patient tolerated treatment well    Behavior During Therapy WFL for tasks assessed/performed           Past Medical History:  Diagnosis Date  . Anemia, unspecified   . Hyperlipidemia, unspecified   . Hypertension   . Hypothyroidism, unspecified   . Pre-diabetes   . Vitamin B12 deficiency     Past Surgical History:  Procedure Laterality Date  . ABDOMINAL HYSTERECTOMY    . GIVENS CAPSULE STUDY N/A 04/16/2018   Procedure: GIVENS CAPSULE STUDY;  Surgeon: Carol Ada, MD;  Location: [facility];  Service: Endoscopy;  Laterality: N/A;  . PARS PLANA VITRECTOMY Right 08/16/2020   Procedure: 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;  Surgeon: Jalene Mullet, MD;  Location: [facility];  Service: Ophthalmology;  Laterality: Right;  . PHOTOCOAGULATION WITH LASER Right 08/16/2020   Procedure: PHOTOCOAGULATION WITH LASER;  Surgeon: Jalene Mullet, MD;  Location: [facility];  Service: Ophthalmology;  Laterality: Right;  . SHOULDER SURGERY    . TONSILLECTOMY    . TUBAL LIGATION      There were no vitals filed for this visit.   Subjective Assessment - 09/15/20 1023    Patient  is accompanied by: Family member    Pertinent History newly diagnosed PD (in last month).   PMH:  anemia, hyperlipidemia, HTN, hypothyroidism, Vitamin B12 deficiency, hx of R RTC surgery, leg swelling, arthritis (per pt), obesity, orthostatic hypotension    Patient Stated Goals be able to move better and travel (to [place] first)    Currently in Pain? Yes    Pain Score 2     Pain Location Shoulder    Pain Orientation Left    Pain Descriptors / Indicators Aching    Pain Type Acute pain    Pain Onset More than a month ago    Pain Frequency Intermittent    Aggravating Factors  unknown    Pain Relieving Factors unknown            Table slides for shoulder flex and abduction with each UE for stretch in trunk, forward wt. Shift, and elbow ext with min cueing.  Placing small pegs in pegboard with R hand with use of PWR! Hands with min v.c. to copy design.   Pt with min difficulty with R hand.   Arm bike x73mn level 1 for reciprocal movement with cues/target of at least 25rpms for intensity while maintaining movement amplitude/reciprocal movement.   Pt maintained 16 rpms.  Then x284m with therapist assisting to maintain 25-30rpms for forced use and decr bradykinesia.   Standing,  tossing beanbags with RUE with min-mod cueing (visual, tactile, verbal cues) for incr movement amplitude, forward/backward wt. Shift and trunk rotation.  Sitting/standing with functional reaching to place clothespins with 1-8lb resist on vertical pole with set-up for large amplitude movements/reach.         OT Short Term Goals - 09/09/20 1229      OT SHORT TERM GOAL #1   Title Pt will be independent with PD-specific HEP--check STGs 09/11/20    Time 4    Period Weeks    Status On-going   09/01/20:  needs cueing/reinforcement     OT SHORT TERM GOAL #2   Title Pt will demo at least 110* R shoulder flex with -40* elbow ext for functional reach.    Baseline 100* with -45* elbow ext    Time 4    Period Weeks     Status Achieved   09/01/20:  110* with -30* elbow ext     OT SHORT TERM GOAL #3   Title Pt will improve R hand coordination for ADLs as shown by improving time on 9-hole peg test by at least 6sec.    Baseline 56.84sec    Time 4    Period Weeks    Status On-going   09/08/20:  61sec     OT SHORT TERM GOAL #4   Title Pt willdemonstrate improved ease with dressing as evidenced by  performing  PPT#4 donning/ doffing jacket in 1 min 15 secs    Baseline 1 min 29 secs    Time 4    Period Weeks    Status On-going   09/08/20:  >28mn     OT SHORT TERM GOAL #5   Title Pt will verbalize understanding of appropriate community resources and ways to decr risk of future complications.    Time 4    Period Weeks    Status On-going   09/08/20:  not fully met (began education regarding ways to decr future complications), continue goal            OT Long Term Goals - 09/09/20 1231      OT LONG TERM GOAL #1   Title Pt will verbalize understanding of adaptive strategies to incr ease, safety, and independence with ADLs and IADLs.--check LTGs 10/11/20    Time 8    Period Weeks    Status New      OT LONG TERM GOAL #2   Title Pt will demo at least 115* R shoulder flex with -35* elbow ext for functional reach.    Baseline 100* with -45* elbow ext    Time 8    Period Weeks    Status New      OT LONG TERM GOAL #3   Title Pt will perform UB dressing consistently mod I.    Time 8    Period Weeks    Status New      OT LONG TERM GOAL #4   Title Pt will perform LB dressing consistently mod I.    Time 8    Period Weeks    Status New      OT LONG TERM GOAL #5   Title Pt will improve R hand coordination for ADLs as shown by improving time on 9-hole peg test by at least 10sec.    Baseline 56.84sec    Time 8    Period Weeks    Status New      OT LONG TERM GOAL #6   Title Pt will improved  RUE functional reaching and coordination for ADLs as shown by improving score on box and blocks test by at  least 5 blocks.    Baseline R-31 blocks, L-40 blocks    Time 8    Period Weeks    Status New                 Plan - 09/15/20 1031    Clinical Impression Statement Pt is slowly progressing towards goals.  Pt with significant bradykinesia and needed verbal and tactile and visual cueing at times to incr movement amplitude.    OT Occupational Profile and History Detailed Assessment- Review of Records and additional review of physical, cognitive, psychosocial history related to current functional performance    Occupational performance deficits (Please refer to evaluation for details): ADL's;IADL's;Social Participation;Leisure    Body Structure / Function / Physical Skills ADL;Balance;Coordination;Decreased knowledge of use of DME;GMC;UE functional use;FMC;Tone;Mobility;Flexibility;IADL;ROM;Dexterity;Body mechanics    Rehab Potential Good    OT Frequency 2x / week    OT Duration 8 weeks    OT Treatment/Interventions Self-care/ADL training;Moist Heat;Aquatic Therapy;Therapeutic activities;Balance training;DME and/or AE instruction;Therapeutic exercise;Passive range of motion;Functional Mobility Training;Neuromuscular education;Cryotherapy;Manual Therapy;Energy conservation;Patient/family education;Ultrasound    Plan ADL strategies, large amplitude movement strategies with movement/functional tasks, review/issue handout for decr risk of future complications    Consulted and Agree with Plan of Care Patient;Family member/caregiver    Family Member Consulted husband           Patient will benefit from skilled therapeutic intervention in order to improve the following deficits and impairments:   Body Structure / Function / Physical Skills: ADL,Balance,Coordination,Decreased knowledge of use of DME,GMC,UE functional use,FMC,Tone,Mobility,Flexibility,IADL,ROM,Dexterity,Body mechanics       Visit Diagnosis: Other symptoms and signs involving the nervous system  Other symptoms and signs  involving the musculoskeletal system  Abnormal posture  Unsteadiness on feet  Muscle weakness (generalized)  Other lack of coordination  Stiffness of right elbow, not elsewhere classified  Stiffness of right shoulder, not elsewhere classified  Other abnormalities of gait and mobility    Problem List Patient Active Problem List   Diagnosis Date Noted  . Pain due to onychomycosis of toenails of both feet 06/17/2020  . Parkinson's disease ([facility]) 04/23/2020  . Leg swelling 11/26/2019  . Chronic venous insufficiency 11/26/2019  . Vitamin B12 deficiency 02/06/2018  . Sepsis secondary to UTI ([facility]) 01/29/2018  . Microcytic anemia 01/29/2018  . Renal insufficiency 01/29/2018  . Pure hypercholesterolemia 12/24/2014  . Diabetes mellitus type 2, controlled, without complications ([facility]) 21/19/4174  . Bruit of right carotid artery 12/24/2014  . Thyroid disease 07/06/2014  . Severe obesity (BMI >= 40) ([facility]) 07/06/2014  . HTN (hypertension) 07/06/2014    [facility] 09/15/2020, 11:23 AM  [facility] [address] [place], [place], [address] Phone: [phone]   Fax:  [phone]  Name: Anna Bernard MRN: [id number] Date of Birth: 12-12-1950   Vianne Bulls, OTR/L [facility] [address] [place],   [address] [phone] phone [phone] 09/15/20 11:23 AM

## 2020-09-16 ENCOUNTER — Ambulatory Visit: Payer: Medicare Other | Admitting: Podiatry

## 2020-09-16 ENCOUNTER — Encounter: Payer: Self-pay | Admitting: Podiatry

## 2020-09-16 DIAGNOSIS — E119 Type 2 diabetes mellitus without complications: Secondary | ICD-10-CM

## 2020-09-16 DIAGNOSIS — B351 Tinea unguium: Secondary | ICD-10-CM | POA: Diagnosis not present

## 2020-09-16 DIAGNOSIS — N289 Disorder of kidney and ureter, unspecified: Secondary | ICD-10-CM | POA: Diagnosis not present

## 2020-09-16 DIAGNOSIS — M79675 Pain in left toe(s): Secondary | ICD-10-CM | POA: Diagnosis not present

## 2020-09-16 DIAGNOSIS — I872 Venous insufficiency (chronic) (peripheral): Secondary | ICD-10-CM

## 2020-09-16 DIAGNOSIS — M79674 Pain in right toe(s): Secondary | ICD-10-CM

## 2020-09-16 NOTE — Progress Notes (Signed)
This patient returns to my office for at risk foot care.  This patient requires this care by a professional since this patient will be at risk due to having diabetes, chronic kidney disease.   This patient is unable to cut nails herself since the patient cannot reach her nails.These nails are painful walking and wearing shoes.  This patient presents for at risk foot care today.  General Appearance  Alert, conversant and in no acute stress.  Vascular  Dorsalis pedis pulses are palpable  bilaterally. Posterior tibial pulses are absent. Capillary return is within normal limits  bilaterally. Cold feet bilaterally. Absent digital hair.  Neurologic  Senn-Weinstein monofilament wire test within normal limits left foot.  LOPS right absent.. Muscle power within normal limits bilaterally.  Nails Thick disfigured discolored nails with subungual debris  from hallux to fifth toes bilaterally. No evidence of bacterial infection or drainage bilaterally.  Orthopedic  No limitations of motion  feet .  No crepitus or effusions noted.  No bony pathology or digital deformities noted. PTTD right foot.  DJD right reardoot medially.  Skin  normotropic skin with no porokeratosis noted bilaterally.  No signs of infections or ulcers noted.     Onychomycosis  Pain in right toes  Pain in left toes  Consent was obtained for treatment procedures.   Mechanical debridement of nails 1-5  bilaterally performed with a nail nipper.  Filed with dremel without incident. Diabetic foot exam was performed.   Return office visit  3 months                    Told patient to return for periodic foot care and evaluation due to potential at risk complications.   Helane Gunther DPM

## 2020-09-17 ENCOUNTER — Encounter: Payer: Self-pay | Admitting: Physical Therapy

## 2020-09-17 ENCOUNTER — Ambulatory Visit: Payer: Medicare Other | Admitting: Occupational Therapy

## 2020-09-17 ENCOUNTER — Other Ambulatory Visit: Payer: Self-pay

## 2020-09-17 ENCOUNTER — Ambulatory Visit: Payer: Medicare Other | Admitting: Physical Therapy

## 2020-09-17 DIAGNOSIS — R2689 Other abnormalities of gait and mobility: Secondary | ICD-10-CM

## 2020-09-17 DIAGNOSIS — M25621 Stiffness of right elbow, not elsewhere classified: Secondary | ICD-10-CM

## 2020-09-17 DIAGNOSIS — R29898 Other symptoms and signs involving the musculoskeletal system: Secondary | ICD-10-CM

## 2020-09-17 DIAGNOSIS — R2681 Unsteadiness on feet: Secondary | ICD-10-CM

## 2020-09-17 DIAGNOSIS — R29818 Other symptoms and signs involving the nervous system: Secondary | ICD-10-CM | POA: Diagnosis not present

## 2020-09-17 DIAGNOSIS — R293 Abnormal posture: Secondary | ICD-10-CM

## 2020-09-17 DIAGNOSIS — M25611 Stiffness of right shoulder, not elsewhere classified: Secondary | ICD-10-CM | POA: Diagnosis not present

## 2020-09-17 DIAGNOSIS — R278 Other lack of coordination: Secondary | ICD-10-CM | POA: Diagnosis not present

## 2020-09-17 DIAGNOSIS — M6281 Muscle weakness (generalized): Secondary | ICD-10-CM | POA: Diagnosis not present

## 2020-09-17 NOTE — Patient Instructions (Addendum)
    Ways to prevent future Parkinson's related complications:  1.   Exercise regularly.  Perform your therapy exercises and incorporate safe aerobic exercise when possible (swimming, stationary bike, arm bike, seated stepper)  2.   Focus on BIGGER movements during daily activities- really reach overhead, straighten elbows and extend fingers  3.   When dressing or reaching for your seatbelt make sure to use your body to assist by twisting and looking at where you are reaching while you reach--this can help to minimize stress on the shoulder and reduce the risk of a rotator cuff tear  4.   Anytime you reach or move shoulder, make sure you have good upright posture.  5.  When you reach for something overhead, make sure your thumb is facing up.  This is a better position for your shoulder.  6.  Keep you feet apart when you are standing to allow you to have better balance and reach further (which can help with shoulder rigidity).  Also make sure your feet are apart when you are sitting before you stand up.

## 2020-09-17 NOTE — Therapy (Signed)
[facility] [address] [place], [place], [address] Phone: [phone]   Fax:  [phone]  Physical Therapy Treatment/Re-Cert:  Patient Details  Name: Anna Bernard MRN: [id number] Date of Birth: 07-22-1951 Referring Provider (PT): Kerin Salen, DO   Encounter Date: 09/17/2020   PT End of Session - 09/17/20 1638    Visit Number 17    Number of Visits 25    Date for PT Re-Evaluation 11/16/20   per re-cert on 58/43/88   Authorization Type UHC Medicare    Progress Note Due on Visit 20    PT Start Time 1102    PT Stop Time 1145    PT Time Calculation (min) 43 min    Equipment Utilized During Treatment Gait belt    Activity Tolerance Patient tolerated treatment well    Behavior During Therapy [facility] for tasks assessed/performed;Flat affect           Past Medical History:  Diagnosis Date  . Anemia, unspecified   . Hyperlipidemia, unspecified   . Hypertension   . Hypothyroidism, unspecified   . Pre-diabetes   . Vitamin B12 deficiency     Past Surgical History:  Procedure Laterality Date  . ABDOMINAL HYSTERECTOMY    . GIVENS CAPSULE STUDY N/A 04/16/2018   Procedure: GIVENS CAPSULE STUDY;  Surgeon: Jeani Hawking, MD;  Location: [facility] ENDOSCOPY;  Service: Endoscopy;  Laterality: N/A;  . PARS PLANA VITRECTOMY Right 08/16/2020   Procedure: 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;  Surgeon: Carmela Rima, MD;  Location: [facility] OR;  Service: Ophthalmology;  Laterality: Right;  . PHOTOCOAGULATION WITH LASER Right 08/16/2020   Procedure: PHOTOCOAGULATION WITH LASER;  Surgeon: Carmela Rima, MD;  Location: [facility] OR;  Service: Ophthalmology;  Laterality: Right;  . SHOULDER SURGERY    . TONSILLECTOMY    . TUBAL LIGATION      There were no vitals filed for this visit.   Subjective Assessment - 09/17/20 1105    Subjective No changes since she was last here. No falls.    Patient is accompained  by: Family member   [name], husband   Pertinent History Dx with Parkinson's disease (Dr. Arbutus Leas) 04/2020 and Sinemet started; PMH of HTN, DM, hypothyroidism, orthostatic hypotension    Patient Stated Goals Get my walk back, be able to do things again.    Currently in Pain? No/denies    Pain Onset More than a month ago              [facility] PT Assessment - 09/17/20 0001      Assessment   Medical Diagnosis Parkinson's Disease    Referring Provider (PT) Kerin Salen, DO    Onset Date/Surgical Date 04/23/20      Prior Function   Level of Independence Independent                         [facility] Adult PT Treatment/Exercise - 09/17/20 0001      Transfers   Sit to Stand 5: Supervision;With upper extremity assist;From bed;From chair/3-in-1    Sit to Stand Details Verbal cues for sequencing;Verbal cues for technique;Visual cues for safe use of DME/AE;Visual cues/gestures for sequencing    Sit to Stand Details (indicate cue type and reason) initial cues to scoot toes edge and for nose over toes    Stand to Sit 5: Supervision;To chair/3-in-1;With upper extremity assist;To bed    Stand to Sit Details (indicate cue type and  reason) Verbal cues for sequencing;Verbal cues for technique      Ambulation/Gait   Ambulation/Gait Yes    Ambulation/Gait Assistance 5: Supervision    Ambulation Distance (Feet) 115 Feet    Assistive device Rollator    Gait Pattern Step-through pattern;Decreased step length - right;Decreased step length - left;Decreased stance time - right;Decreased stance time - left;Decreased dorsiflexion - right;Decreased dorsiflexion - left;Shuffle;Poor foot clearance - left;Poor foot clearance - right    Ambulation Surface Level;Indoor    Gait velocity 15.63 seconds = 2.09 ft/sec      Neuro Re-ed    Neuro Re-ed Details  standing with BUE support on outside of // bars: alternating foot taps to raised // bar platform x10 reps B          Reviewed HEP and rationale behind each  exercise:    WALK 2-3 minutes, 3 times per day (in home with rollator)   Access Code: [email or web address] URL: [email or web address] Date: 09/17/2020 Prepared by: Janann August  Exercises Seated Ankle Dorsiflexion AROM - 2-3 x daily - 5 x weekly - 2 sets - 10 reps Seated Heel Raise - 2-3 x daily - 5 x weekly - 2 sets - 10 reps Seated Ankle Circles - 2-3 x daily - 5 x weekly - 1 sets - 5-10 reps - or seated ankle alphabets  Side Stepping with Counter Support - 1-2 x daily - 5 x weekly - 2 sets - 5 reps Lateral Weight Shift - 1-2 x daily - 5 x weekly - 2 sets - 10 reps - cues to push off toes for heel raise when weight shifting  Alternating Step Backward with Support - 1-2 x daily - 5 x weekly - 2 sets - 5 reps - needing initial cues for proper technique  Sit to Stand with Armchair - 1-2 x daily - 5 x weekly - 2 sets - 5 reps - new addition, cues for forward weight shift, tall posture in standing, and slowed stand > sit for BLE strength      PT Education - 09/17/20 1638    Education Details reviewed HEP - with addition of sit <> stands, importance of performing walking program at home daily, progress towards goals.    Person(s) Educated Patient;Spouse    Methods Explanation;Demonstration;Handout    Comprehension Returned demonstration;Verbalized understanding;Verbal cues required            PT Short Term Goals - 08/25/20 1120      PT SHORT TERM GOAL #1   Title Pt will perform HEP with family supervision, for improved balance, stregnth, transfers, and gait.  TARGET 08/14/2020    Baseline Not currently performing HEP, due to unplanned eye surgery    Time 5    Period Weeks    Status Not Met      PT SHORT TERM GOAL #2   Title Pt will improve 5x sit<>stand to less than or equal to 20 seconds for improved transfer efficiency and functional strength.    Baseline 08/10/20-23.66 seconds    Time 5    Period Weeks    Status Not Met      PT SHORT TERM GOAL #3   Title Berg  score to be assessed, with Berg score improved by at least 5 points for decreased fall risk.    Baseline 28/56 on 08/10/20    Time 5    Period Weeks    Status Achieved      PT SHORT TERM GOAL #4   Title Pt  will improve TUG score to less than or equal to 20 seconds for decreased fall risk.    Baseline time increased to 34.72 on 08/10/20   performed at end of session and pt reports fatigue and unwilling to perform 2nd attempts   Time 5    Period Weeks    Status Not Met      PT SHORT TERM GOAL #5   Title Pt/husband will verbalize understanding of fall prevention in home environment.    Time 5    Period Weeks    Status Achieved             PT Long Term Goals - 09/17/20 1115      PT LONG TERM GOAL #1   Title Pt will perform progression of HEP, including verbalizing plans for optimal fitness plan upon d/c from PT.  TARGET 09/18/2020 (updated date to reflect week remaining in POC)    Baseline reviewed HEP, and addition of sit <> stands    Time 9    Period Weeks    Status Partially Met      PT LONG TERM GOAL #2   Title Pt will improve 5x sit<>stand in less than or equal to 15 seconds for improved transfer efficiency and functional strength.    Baseline 26.38 seconds one and on rollator and one hand pushing up    Time 9    Period Weeks    Status Not Met      PT LONG TERM GOAL #3   Title Pt will improve gait velocity to at least 1.8 ft/sec for improved gait efficiency and safety.    Baseline 1.27 ft/sec, 15.63 seconds = 2.09 ft/sec with rollator    Time 9    Period Weeks    Status Achieved      PT LONG TERM GOAL #4   Title Pt will improve TUG score to less than or equal to 15 seconds for decreased fall risk.    Baseline 45.13 seconds with rollator on 09/15/20 2nd attempt    Time 9    Period Weeks    Status Not Met      PT LONG TERM GOAL #5   Title Pt will ambulate at least 215 ft in 2 minute walk test, for improved gait efficiency and safety.    Baseline 175 ft, 180' on  09/15/20    Time 9    Period Weeks    Status Not Met      PT LONG TERM GOAL #6   Title Pt will verbalize understanding of local Parkinson's disease resources.    Baseline provided pt with handout on 09/15/20    Time 9    Period Weeks    Status Achieved              Revised/ongoing LTGs for re-cert:      PT Long Term Goals - 09/17/20 1651      PT LONG TERM GOAL #1   Title Pt will perform progression of HEPand walking program, including verbalizing plans for optimal fitness plan upon d/c from PT.  TARGET 10/15/20    Baseline reviewed HEP, and addition of sit <> stands    Time 4    Period Weeks    Status On-going    Target Date 10/15/20      PT LONG TERM GOAL #2   Title Pt will improve 5x sit<>stand in less than or equal to 20 seconds for improved transfer efficiency and functional strength.  Baseline 26.38 seconds one and on rollator and one hand pushing up    Time 4    Period Weeks    Status Revised      PT LONG TERM GOAL #3   Title Pt will improve gait velocity to at least 2.3 ft/sec for improved gait efficiency and safety.    Baseline 1.27 ft/sec, 15.63 seconds = 2.09 ft/sec with rollator    Time 4    Period Weeks    Status Revised      PT LONG TERM GOAL #4   Title Pt will improve TUG score to less than or equal to 30 seconds for decreased fall risk.    Baseline 45.13 seconds with rollator on 09/15/20 2nd attempt    Time 4    Period Weeks    Status Revised      PT LONG TERM GOAL #5   Title Pt will ambulate at least 215 ft in 2 minute walk test, for improved gait efficiency and safety.    Baseline 175 ft, 180' on 09/15/20    Time 4    Period Weeks    Status On-going              Plan - 09/17/20 1649    Clinical Impression Statement Focus of today's skilled session was assessing remainder of pt's LTGs. Pt met LTG #3 in regards to gait speed, performed in 2.09 ft/sec today with rollator (previously 1.27 ft/sec), indicating pt is at a decr fall risk.  Pt is able to take more consistent longer steps. Pt is still at an incr risk for falls based on TUG with rollator (45.13 seconds) and 5x sit <> stand. Remainder of session focused on reviewing pt's HEP and walking program as well as the addition of 5x sit <> stands for functional BLE strengthening. Pt will continue to benefit from skilled PT for an additional 2x week for 4 weeks to continue to work on impairments listed below to improve pt's functional independence and decr pt's risk of falls. LTGs updated/revised as appropriate.    Personal Factors and Comorbidities Comorbidity 3+    Comorbidities PMH:  HTN, DM, hypothyroidism, orthostatic hypotension    Examination-Activity Limitations Locomotion Level;Stand;Transfers    Examination-Participation Restrictions Community Activity;Other   Plans for travel in retirement   Stability/Clinical Decision Making Evolving/Moderate complexity    Rehab Potential Good    PT Frequency 2x / week    PT Duration 4 weeks    PT Treatment/Interventions ADLs/Self Care Home Management;DME Instruction;Neuromuscular re-education;Balance training;Therapeutic exercise;Therapeutic activities;Functional mobility training;Gait training;Patient/family education;Manual techniques    PT Next Visit Plan how is HEP and walking program/  Continue standing exercises at countertop. functional strengthening/step ups and stair training.  TUG shuttle/turn training.    Consulted and Agree with Plan of Care Patient    Family Member Consulted husband, Marden Noble           Patient will benefit from skilled therapeutic intervention in order to improve the following deficits and impairments:  Abnormal gait,Difficulty walking,Decreased balance,Decreased mobility,Decreased strength,Postural dysfunction  Visit Diagnosis: Other symptoms and signs involving the nervous system  Other symptoms and signs involving the musculoskeletal system  Abnormal posture  Unsteadiness on feet  Other lack of  coordination     Problem List Patient Active Problem List   Diagnosis Date Noted  . Pain due to onychomycosis of toenails of both feet 06/17/2020  . Parkinson's disease ([place]) 04/23/2020  . Leg swelling 11/26/2019  . Chronic venous insufficiency 11/26/2019  .  Vitamin B12 deficiency 02/06/2018  . Sepsis secondary to UTI ([place]) 01/29/2018  . Microcytic anemia 01/29/2018  . Renal insufficiency 01/29/2018  . Pure hypercholesterolemia 12/24/2014  . Diabetes mellitus type 2, controlled, without complications ([place]) 55/97/4163  . Bruit of right carotid artery 12/24/2014  . Thyroid disease 07/06/2014  . Severe obesity (BMI >= 40) ([place]) 07/06/2014  . HTN (hypertension) 07/06/2014    Arliss Journey, PT, DPT  09/17/2020, 4:50 PM  [place] [address] [place], [place], [address] Phone: [phone]   Fax:  [phone]  Name: DEIDRE CARINO MRN: [id number] Date of Birth: Feb 08, 1951

## 2020-09-17 NOTE — Patient Instructions (Addendum)
WALK 2-3 minutes, 3 times per day (in home with rollator)   Access Code: [email or web address] URL: [email or web address] Date: 09/17/2020 Prepared by: Sherlie Ban  Exercises Seated Ankle Dorsiflexion AROM - 2-3 x daily - 5 x weekly - 2 sets - 10 reps Seated Heel Raise - 2-3 x daily - 5 x weekly - 2 sets - 10 reps Seated Ankle Circles - 2-3 x daily - 5 x weekly - 1 sets - 5-10 reps Side Stepping with Counter Support - 1-2 x daily - 5 x weekly - 2 sets - 5 reps Lateral Weight Shift - 1-2 x daily - 5 x weekly - 2 sets - 10 reps Alternating Step Backward with Support - 1-2 x daily - 5 x weekly - 2 sets - 5 reps Sit to Stand with Armchair - 1-2 x daily - 5 x weekly - 2 sets - 5 reps

## 2020-09-17 NOTE — Therapy (Signed)
[facility] [address] [place], [place], [address] Phone: [phone]   Fax:  [phone]  Occupational Therapy Treatment  Patient Details  Name: Anna Bernard MRN: [id number] Date of Birth: 07-12-1951 Referring Provider (OT): Dr. Wells Guiles Tat   Encounter Date: 09/17/2020   OT End of Session - 09/17/20 1025    Visit Number 12    Number of Visits 17    Date for OT Re-Evaluation 09/11/20    Authorization Type Medicare UHC:  no visit limit or auth required    Authorization Time Period cert. date 07/13/20-10/11/20    Authorization - Visit Number 12    Authorization - Number of Visits 20    Progress Note Due on Visit 20    OT Start Time 1022    OT Stop Time 1100    OT Time Calculation (min) 38 min    Activity Tolerance Patient tolerated treatment well    Behavior During Therapy WFL for tasks assessed/performed           Past Medical History:  Diagnosis Date  . Anemia, unspecified   . Hyperlipidemia, unspecified   . Hypertension   . Hypothyroidism, unspecified   . Pre-diabetes   . Vitamin B12 deficiency     Past Surgical History:  Procedure Laterality Date  . ABDOMINAL HYSTERECTOMY    . GIVENS CAPSULE STUDY N/A 04/16/2018   Procedure: GIVENS CAPSULE STUDY;  Surgeon: Carol Ada, MD;  Location: [place];  Service: Endoscopy;  Laterality: N/A;  . PARS PLANA VITRECTOMY Right 08/16/2020   Procedure: 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;  Surgeon: Jalene Mullet, MD;  Location: [place];  Service: Ophthalmology;  Laterality: Right;  . PHOTOCOAGULATION WITH LASER Right 08/16/2020   Procedure: PHOTOCOAGULATION WITH LASER;  Surgeon: Jalene Mullet, MD;  Location: [place];  Service: Ophthalmology;  Laterality: Right;  . SHOULDER SURGERY    . TONSILLECTOMY    . TUBAL LIGATION      There were no vitals filed for this visit.   Subjective Assessment - 09/17/20 1025     Subjective  taking pain medication    Patient is accompanied by: Family member    Pertinent History newly diagnosed PD (in last month).   PMH:  anemia, hyperlipidemia, HTN, hypothyroidism, Vitamin B12 deficiency, hx of R RTC surgery, leg swelling, arthritis (per pt), obesity, orthostatic hypotension    Patient Stated Goals be able to move better and travel (to [place] first)    Currently in Pain? No/denies    Pain Onset More than a month ago              Table slides for shoulder flex and abduction with each UE for stretch in trunk, forward wt. Shift, and elbow ext with min cueing.  Cane ex in sitting for shoulder flex and L shoulder abduction with min cueing for positioning and decr pain.  Sit>stand with min cueing for forward wt. Shift and scoot chair prior to standing.  Reviewed large amplitude movement strategies for donning/doffing jacket.  Pt able to doff with min cueing x3 and don with min A/mod cueing x2.  Pt encouraged to do as much as she can with dressing tasks even if she can only focus on UB dressing 1 day and LB dressing a different day.  Pt educated in importance of continued activity to decr risk of future complications/decr rigidity.  Bilateral coordination task:  Holding golf tee in L hand while putting beads on  with R hand for incr coordination, min cueing for [employer]! Hands prior with R hand and min difficulty.  Cleaning hands with sanitizer with min cueing for incr use of R hand for bilateral task.      OT Education - 09/17/20 1100    Education Details Ways to decr risk of future complications related to PD    Person(s) Educated Patient    Methods Explanation;Handout    Comprehension Verbalized understanding            OT Short Term Goals - 09/09/20 1229      OT SHORT TERM GOAL #1   Title Pt will be independent with PD-specific HEP--check STGs 09/11/20    Time 4    Period Weeks    Status On-going   09/01/20:  needs cueing/reinforcement     OT SHORT TERM GOAL #2    Title Pt will demo at least 110* R shoulder flex with -40* elbow ext for functional reach.    Baseline 100* with -45* elbow ext    Time 4    Period Weeks    Status Achieved   09/01/20:  110* with -30* elbow ext     OT SHORT TERM GOAL #3   Title Pt will improve R hand coordination for ADLs as shown by improving time on 9-hole peg test by at least 6sec.    Baseline 56.84sec    Time 4    Period Weeks    Status On-going   09/08/20:  61sec     OT SHORT TERM GOAL #4   Title Pt willdemonstrate improved ease with dressing as evidenced by  performing  PPT#4 donning/ doffing jacket in 1 min 15 secs    Baseline 1 min 29 secs    Time 4    Period Weeks    Status On-going   09/08/20:  >68mn     OT SHORT TERM GOAL #5   Title Pt will verbalize understanding of appropriate community resources and ways to decr risk of future complications.    Time 4    Period Weeks    Status On-going   09/08/20:  not fully met (began education regarding ways to decr future complications), continue goal            OT Long Term Goals - 09/09/20 1231      OT LONG TERM GOAL #1   Title Pt will verbalize understanding of adaptive strategies to incr ease, safety, and independence with ADLs and IADLs.--check LTGs 10/11/20    Time 8    Period Weeks    Status New      OT LONG TERM GOAL #2   Title Pt will demo at least 115* R shoulder flex with -35* elbow ext for functional reach.    Baseline 100* with -45* elbow ext    Time 8    Period Weeks    Status New      OT LONG TERM GOAL #3   Title Pt will perform UB dressing consistently mod I.    Time 8    Period Weeks    Status New      OT LONG TERM GOAL #4   Title Pt will perform LB dressing consistently mod I.    Time 8    Period Weeks    Status New      OT LONG TERM GOAL #5   Title Pt will improve R hand coordination for ADLs as shown by improving time on 9-hole peg test by  at least 10sec.    Baseline 56.84sec    Time 8    Period Weeks    Status New       OT LONG TERM GOAL #6   Title Pt will improved RUE functional reaching and coordination for ADLs as shown by improving score on box and blocks test by at least 5 blocks.    Baseline R-31 blocks, L-40 blocks    Time 8    Period Weeks    Status New                 Plan - 09/17/20 1026    Clinical Impression Statement Pt is slowly progressing towards goals.  Pt with improved doffing jacket, but needs min assist and cueing for donning and use of large amplitude movement strategies.    OT Occupational Profile and History Detailed Assessment- Review of Records and additional review of physical, cognitive, psychosocial history related to current functional performance    Occupational performance deficits (Please refer to evaluation for details): ADL's;IADL's;Social Participation;Leisure    Body Structure / Function / Physical Skills ADL;Balance;Coordination;Decreased knowledge of use of DME;GMC;UE functional use;FMC;Tone;Mobility;Flexibility;IADL;ROM;Dexterity;Body mechanics    Rehab Potential Good    OT Frequency 2x / week    OT Duration 8 weeks    OT Treatment/Interventions Self-care/ADL training;Moist Heat;Aquatic Therapy;Therapeutic activities;Balance training;DME and/or AE instruction;Therapeutic exercise;Passive range of motion;Functional Mobility Training;Neuromuscular education;Cryotherapy;Manual Therapy;Energy conservation;Patient/family education;Ultrasound    Plan ADL strategies/dressing strategies, trunk rotation and wt.shift with functional reach, continue to work towards goals    Consulted and Agree with Plan of Care Patient;Family member/caregiver    Family Member Consulted husband           Patient will benefit from skilled therapeutic intervention in order to improve the following deficits and impairments:   Body Structure / Function / Physical Skills: ADL,Balance,Coordination,Decreased knowledge of use of DME,GMC,UE functional  use,FMC,Tone,Mobility,Flexibility,IADL,ROM,Dexterity,Body mechanics       Visit Diagnosis: Other symptoms and signs involving the nervous system  Other symptoms and signs involving the musculoskeletal system  Abnormal posture  Unsteadiness on feet  Other lack of coordination  Stiffness of right elbow, not elsewhere classified  Stiffness of right shoulder, not elsewhere classified  Other abnormalities of gait and mobility    Problem List Patient Active Problem List   Diagnosis Date Noted  . Pain due to onychomycosis of toenails of both feet 06/17/2020  . Parkinson's disease ([place]) 04/23/2020  . Leg swelling 11/26/2019  . Chronic venous insufficiency 11/26/2019  . Vitamin B12 deficiency 02/06/2018  . Sepsis secondary to UTI ([place]) 01/29/2018  . Microcytic anemia 01/29/2018  . Renal insufficiency 01/29/2018  . Pure hypercholesterolemia 12/24/2014  . Diabetes mellitus type 2, controlled, without complications ([place]) 76/81/1572  . Bruit of right carotid artery 12/24/2014  . Thyroid disease 07/06/2014  . Severe obesity (BMI >= 40) ([place]) 07/06/2014  . HTN (hypertension) 07/06/2014    [facility] 09/17/2020, 2:12 PM  [place] [address] [place], [place], [address] Phone: [phone]   Fax:  [phone]  Name: Anna Bernard MRN: [id number] Date of Birth: 1951/04/06   Vianne Bulls, OTR/L [facility] [address] [place], [place]  [address] [phone] phone [phone] 09/17/20 2:12 PM

## 2020-09-21 ENCOUNTER — Ambulatory Visit: Payer: Medicare Other | Admitting: Physical Therapy

## 2020-09-21 ENCOUNTER — Encounter: Payer: Self-pay | Admitting: Physical Therapy

## 2020-09-21 ENCOUNTER — Other Ambulatory Visit: Payer: Self-pay

## 2020-09-21 ENCOUNTER — Ambulatory Visit: Payer: Medicare Other | Attending: Neurology | Admitting: Occupational Therapy

## 2020-09-21 DIAGNOSIS — R29898 Other symptoms and signs involving the musculoskeletal system: Secondary | ICD-10-CM | POA: Diagnosis not present

## 2020-09-21 DIAGNOSIS — R2681 Unsteadiness on feet: Secondary | ICD-10-CM

## 2020-09-21 DIAGNOSIS — M6281 Muscle weakness (generalized): Secondary | ICD-10-CM | POA: Diagnosis not present

## 2020-09-21 DIAGNOSIS — R278 Other lack of coordination: Secondary | ICD-10-CM | POA: Diagnosis not present

## 2020-09-21 DIAGNOSIS — R29818 Other symptoms and signs involving the nervous system: Secondary | ICD-10-CM | POA: Insufficient documentation

## 2020-09-21 DIAGNOSIS — R2689 Other abnormalities of gait and mobility: Secondary | ICD-10-CM | POA: Insufficient documentation

## 2020-09-21 DIAGNOSIS — R293 Abnormal posture: Secondary | ICD-10-CM | POA: Insufficient documentation

## 2020-09-21 DIAGNOSIS — M25621 Stiffness of right elbow, not elsewhere classified: Secondary | ICD-10-CM | POA: Insufficient documentation

## 2020-09-21 DIAGNOSIS — M25611 Stiffness of right shoulder, not elsewhere classified: Secondary | ICD-10-CM | POA: Insufficient documentation

## 2020-09-21 NOTE — Therapy (Signed)
[facility] [address] [place], [place], [address] Phone: [phone]   Fax:  [phone]  Occupational Therapy Treatment  Patient Details  Name: Anna Bernard MRN: [id number] Date of Birth: [birth year] Referring Provider (OT): Dr. Wells Guiles Tat   Encounter Date: 09/21/2020   OT End of Session - 09/21/20 1106    Visit Number 13    Number of Visits 17    Date for OT Re-Evaluation 09/11/20    Authorization Type Medicare UHC:  no visit limit or auth required    Authorization Time Period cert. date 07/13/20-10/11/20, week 8/8    Authorization - Visit Number 13    Authorization - Number of Visits 20    Progress Note Due on Visit 20    OT Start Time 1104    OT Stop Time 1145    OT Time Calculation (min) 41 min    Activity Tolerance Patient tolerated treatment well    Behavior During Therapy WFL for tasks assessed/performed           Past Medical History:  Diagnosis Date  . Anemia, unspecified   . Hyperlipidemia, unspecified   . Hypertension   . Hypothyroidism, unspecified   . Pre-diabetes   . Vitamin B12 deficiency     Past Surgical History:  Procedure Laterality Date  . ABDOMINAL HYSTERECTOMY    . GIVENS CAPSULE STUDY N/A 04/16/2018   Procedure: GIVENS CAPSULE STUDY;  Surgeon: Carol Ada, MD;  Location: [facility];  Service: Endoscopy;  Laterality: N/A;  . PARS PLANA VITRECTOMY Right 08/16/2020   Procedure: 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;  Surgeon: Jalene Mullet, MD;  Location: [facility];  Service: Ophthalmology;  Laterality: Right;  . PHOTOCOAGULATION WITH LASER Right 08/16/2020   Procedure: PHOTOCOAGULATION WITH LASER;  Surgeon: Jalene Mullet, MD;  Location: [facility];  Service: Ophthalmology;  Laterality: Right;  . SHOULDER SURGERY    . TONSILLECTOMY    . TUBAL LIGATION      There were no vitals filed for this visit.   Subjective Assessment - 09/21/20 1104     Subjective  hasn't been doing bag exercises at home.    Patient is accompanied by: Family member    Pertinent History newly diagnosed PD (in last month).   PMH:  anemia, hyperlipidemia, HTN, hypothyroidism, Vitamin B12 deficiency, hx of R RTC surgery, leg swelling, arthritis (per pt), obesity, orthostatic hypotension    Patient Stated Goals be able to move better and travel (to [place] first)    Currently in Pain? Yes    Pain Score 2     Pain Location Foot    Pain Orientation Right    Pain Descriptors / Indicators Aching    Pain Type Chronic pain    Pain Onset More than a month ago    Pain Frequency Intermittent    Aggravating Factors  nothing    Pain Relieving Factors unknown           Reviewed bag HEP for simulated ADLs and pt returned demo each with min cueing for large amplitude.  Practiced donning/doffing jacket x2 with min-mod cueing for large amplitude movement strategies and technique including putting RUE in jacket fiirst.  Pt donned with min A to min cueing with repetition.  Arm bike x63mn level 1 for reciprocal movement with cues/target of at least 20rpms for intensity while maintaining movement amplitude/reciprocal movement.   Pt maintained 14-20 rpms.  Then x382m with therapist assisting to maintain 25-30rpms  for forced use and decr bradykinesia. (6 min total)      OT Education - 09/21/20 1154    Education Details Adaptive Strategies/large amplitude movement strategies for ADLs/IADLs    Person(s) Educated Patient    Methods Explanation;Demonstration;Verbal cues;Handout    Comprehension Verbalized understanding            OT Short Term Goals - 09/09/20 1229      OT SHORT TERM GOAL #1   Title Pt will be independent with PD-specific HEP--check STGs 09/11/20    Time 4    Period Weeks    Status On-going   09/01/20:  needs cueing/reinforcement     OT SHORT TERM GOAL #2   Title Pt will demo at least 110* R shoulder flex with -40* elbow ext for functional reach.     Baseline 100* with -45* elbow ext    Time 4    Period Weeks    Status Achieved   09/01/20:  110* with -30* elbow ext     OT SHORT TERM GOAL #3   Title Pt will improve R hand coordination for ADLs as shown by improving time on 9-hole peg test by at least 6sec.    Baseline 56.84sec    Time 4    Period Weeks    Status On-going   09/08/20:  61sec     OT SHORT TERM GOAL #4   Title Pt willdemonstrate improved ease with dressing as evidenced by  performing  PPT#4 donning/ doffing jacket in 1 min 15 secs    Baseline 1 min 29 secs    Time 4    Period Weeks    Status On-going   09/08/20:  >76mn     OT SHORT TERM GOAL #5   Title Pt will verbalize understanding of appropriate community resources and ways to decr risk of future complications.    Time 4    Period Weeks    Status On-going   09/08/20:  not fully met (began education regarding ways to decr future complications), continue goal            OT Long Term Goals - 09/09/20 1231      OT LONG TERM GOAL #1   Title Pt will verbalize understanding of adaptive strategies to incr ease, safety, and independence with ADLs and IADLs.--check LTGs 10/11/20    Time 8    Period Weeks    Status New      OT LONG TERM GOAL #2   Title Pt will demo at least 115* R shoulder flex with -35* elbow ext for functional reach.    Baseline 100* with -45* elbow ext    Time 8    Period Weeks    Status New      OT LONG TERM GOAL #3   Title Pt will perform UB dressing consistently mod I.    Time 8    Period Weeks    Status New      OT LONG TERM GOAL #4   Title Pt will perform LB dressing consistently mod I.    Time 8    Period Weeks    Status New      OT LONG TERM GOAL #5   Title Pt will improve R hand coordination for ADLs as shown by improving time on 9-hole peg test by at least 10sec.    Baseline 56.84sec    Time 8    Period Weeks    Status New  OT LONG TERM GOAL #6   Title Pt will improved RUE functional reaching and coordination for  ADLs as shown by improving score on box and blocks test by at least 5 blocks.    Baseline R-31 blocks, L-40 blocks    Time 8    Period Weeks    Status New                 Plan - 09/21/20 1108    Clinical Impression Statement Pt is slowly progressing towards goals.  Pt with improved doffing jacket, but needs mincueing for donning and use of large amplitude movement strategies.  Pt with decr carryover at home.    OT Occupational Profile and History Detailed Assessment- Review of Records and additional review of physical, cognitive, psychosocial history related to current functional performance    Occupational performance deficits (Please refer to evaluation for details): ADL's;IADL's;Social Participation;Leisure    Body Structure / Function / Physical Skills ADL;Balance;Coordination;Decreased knowledge of use of DME;GMC;UE functional use;FMC;Tone;Mobility;Flexibility;IADL;ROM;Dexterity;Body mechanics    Rehab Potential Good    OT Frequency 2x / week    OT Duration 8 weeks    OT Treatment/Interventions Self-care/ADL training;Moist Heat;Aquatic Therapy;Therapeutic activities;Balance training;DME and/or AE instruction;Therapeutic exercise;Passive range of motion;Functional Mobility Training;Neuromuscular education;Cryotherapy;Manual Therapy;Energy conservation;Patient/family education;Ultrasound    Plan simulated/practice LB dressing, check goals for renewal next 1-2 sessions    Consulted and Agree with Plan of Care Patient;Family member/caregiver    Family Member Consulted husband           Patient will benefit from skilled therapeutic intervention in order to improve the following deficits and impairments:   Body Structure / Function / Physical Skills: ADL,Balance,Coordination,Decreased knowledge of use of DME,GMC,UE functional use,FMC,Tone,Mobility,Flexibility,IADL,ROM,Dexterity,Body mechanics       Visit Diagnosis: Other symptoms and signs involving the nervous system  Other  symptoms and signs involving the musculoskeletal system  Abnormal posture  Unsteadiness on feet  Other lack of coordination  Stiffness of right elbow, not elsewhere classified  Stiffness of right shoulder, not elsewhere classified  Other abnormalities of gait and mobility    Problem List Patient Active Problem List   Diagnosis Date Noted  . Pain due to onychomycosis of toenails of both feet 06/17/2020  . Parkinson's disease ([place]) 04/23/2020  . Leg swelling 11/26/2019  . Chronic venous insufficiency 11/26/2019  . Vitamin B12 deficiency 02/06/2018  . Sepsis secondary to UTI ([place]) 01/29/2018  . Microcytic anemia 01/29/2018  . Renal insufficiency 01/29/2018  . Pure hypercholesterolemia 12/24/2014  . Diabetes mellitus type 2, controlled, without complications ([place]) 25/18/9842  . Bruit of right carotid artery 12/24/2014  . Thyroid disease 07/06/2014  . Severe obesity (BMI >= 40) ([place]) 07/06/2014  . HTN (hypertension) 07/06/2014    [facility] 09/21/2020, 12:06 PM  [facility] [address] [place], [place], [address] Phone: [phone]   Fax:  [phone]  Name: GITEL BESTE MRN: [id number] Date of Birth: Mar 20, 1951   Vianne Bulls, OTR/L [facility] [address] [place],   [address] [phone] phone [phone] 09/21/20 12:06 PM

## 2020-09-21 NOTE — Therapy (Signed)
[facility] [address] [place], [place], [address] Phone: [phone]   Fax:  [phone]  Physical Therapy Treatment  Patient Details  Name: Anna Bernard MRN: [id number] Date of Birth: [birth year] Referring Provider (PT): Alonza Bogus, DO   Encounter Date: 09/21/2020   PT End of Session - 09/21/20 1210    Visit Number 18    Number of Visits 25    Date for PT Re-Evaluation 47/42/59   per re-cert on 56/38/75   Authorization Type UHC Medicare    Progress Note Due on Visit 20    PT Start Time 1018    PT Stop Time 1100    PT Time Calculation (min) 42 min    Equipment Utilized During Treatment Gait belt    Activity Tolerance Patient tolerated treatment well    Behavior During Therapy [facility] for tasks assessed/performed;Flat affect           Past Medical History:  Diagnosis Date  . Anemia, unspecified   . Hyperlipidemia, unspecified   . Hypertension   . Hypothyroidism, unspecified   . Pre-diabetes   . Vitamin B12 deficiency     Past Surgical History:  Procedure Laterality Date  . ABDOMINAL HYSTERECTOMY    . GIVENS CAPSULE STUDY N/A 04/16/2018   Procedure: GIVENS CAPSULE STUDY;  Surgeon: Carol Ada, MD;  Location: [facility];  Service: Endoscopy;  Laterality: N/A;  . PARS PLANA VITRECTOMY Right 08/16/2020   Procedure: 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;  Surgeon: Jalene Mullet, MD;  Location: [facility];  Service: Ophthalmology;  Laterality: Right;  . PHOTOCOAGULATION WITH LASER Right 08/16/2020   Procedure: PHOTOCOAGULATION WITH LASER;  Surgeon: Jalene Mullet, MD;  Location: [facility];  Service: Ophthalmology;  Laterality: Right;  . SHOULDER SURGERY    . TONSILLECTOMY    . TUBAL LIGATION      There were no vitals filed for this visit.   Subjective Assessment - 09/21/20 1022    Subjective Feeling more motivated this new year. No falls.    Patient is accompained by: Family  member   [name], husband   Pertinent History Dx with Parkinson's disease (Dr. Carles Collet) 04/2020 and Sinemet started; PMH of HTN, DM, hypothyroidism, orthostatic hypotension    Patient Stated Goals Get my walk back, be able to do things again.    Currently in Pain? Yes    Pain Score 2     Pain Location Foot    Pain Orientation Right    Pain Descriptors / Indicators Aching    Pain Type Chronic pain    Pain Onset More than a month ago    Aggravating Factors  nothing, not right now.    Pain Relieving Factors foot cream                             [facility] Adult PT Treatment/Exercise - 09/21/20 1029      Transfers   Comments pt needing initial cues for proper UE placement before performing sit <> stands      Ambulation/Gait   Ambulation/Gait Yes    Ambulation/Gait Assistance 5: Supervision    Ambulation/Gait Assistance Details clinic distances between activities, initial cues for incr step length B    Assistive device Rollator    Gait Pattern Step-through pattern;Decreased step length - right;Decreased step length - left;Decreased stance time - right;Decreased stance time - left;Decreased dorsiflexion - right;Decreased dorsiflexion - left;Shuffle;Poor foot clearance -  left;Poor foot clearance - right    Ambulation Surface Level;Indoor    Stairs Yes    Stairs Assistance 4: Min guard    Stairs Assistance Details (indicate cue type and reason) ascending with stronger LLE and descending with weaker RLE. pt taking incr time. cues for wider BOS when descending. Pt ascending one rep with step through pattern with min guard.    Stair Management Technique Two rails;Step to pattern;Forwards    Number of Stairs 4 -  x2   Height of Stairs 6      Knee/Hip Exercises: Aerobic   Nustep with BLE and BUE for strengthening, ROM and activity tolerance for 5 minutes at gear 3, cuesfor steps per minute at 65-70               Balance Exercises - 09/21/20 0001      Balance Exercises: Standing    Retro Gait 3 reps;Upper extremity support;Limitations    Retro Gait Limitations in // bars, cues for step length B and wider BOS as pt initially with too narrow BOS, cues to attend to R hand and continue to slide it back on bars    Marching Solid surface;Upper extremity assist 2;Forwards    Marching Limitations 3 reps in // bars, cues for posture and incr foot clearance    Other Standing Exercises at staircase: holding onto handrails: alternating B toe taps to 6" step 2 x 10 reps    Other Standing Exercises Comments with BUE support: alternating lateral step overs over 2" beam x10 reps, decr RLE foot clearance at times.               PT Short Term Goals - 08/25/20 1120      PT SHORT TERM GOAL #1   Title Pt will perform HEP with family supervision, for improved balance, stregnth, transfers, and gait.  TARGET 08/14/2020    Baseline Not currently performing HEP, due to unplanned eye surgery    Time 5    Period Weeks    Status Not Met      PT SHORT TERM GOAL #2   Title Pt will improve 5x sit<>stand to less than or equal to 20 seconds for improved transfer efficiency and functional strength.    Baseline 08/10/20-23.66 seconds    Time 5    Period Weeks    Status Not Met      PT SHORT TERM GOAL #3   Title Berg score to be assessed, with Berg score improved by at least 5 points for decreased fall risk.    Baseline 28/56 on 08/10/20    Time 5    Period Weeks    Status Achieved      PT SHORT TERM GOAL #4   Title Pt will improve TUG score to less than or equal to 20 seconds for decreased fall risk.    Baseline time increased to 34.72 on 08/10/20   performed at end of session and pt reports fatigue and unwilling to perform 2nd attempts   Time 5    Period Weeks    Status Not Met      PT SHORT TERM GOAL #5   Title Pt/husband will verbalize understanding of fall prevention in home environment.    Time 5    Period Weeks    Status Achieved             PT Long Term Goals -  09/17/20 1651      PT LONG TERM GOAL #1  Title Pt will perform progression of HEPand walking program, including verbalizing plans for optimal fitness plan upon d/c from PT.  TARGET 10/15/20    Baseline reviewed HEP, and addition of sit <> stands    Time 4    Period Weeks    Status On-going    Target Date 10/15/20      PT LONG TERM GOAL #2   Title Pt will improve 5x sit<>stand in less than or equal to 20 seconds for improved transfer efficiency and functional strength.    Baseline 26.38 seconds one and on rollator and one hand pushing up    Time 4    Period Weeks    Status Revised      PT LONG TERM GOAL #3   Title Pt will improve gait velocity to at least 2.3 ft/sec for improved gait efficiency and safety.    Baseline 1.27 ft/sec, 15.63 seconds = 2.09 ft/sec with rollator    Time 4    Period Weeks    Status Revised      PT LONG TERM GOAL #4   Title Pt will improve TUG score to less than or equal to 30 seconds for decreased fall risk.    Baseline 45.13 seconds with rollator on 09/15/20 2nd attempt    Time 4    Period Weeks    Status Revised      PT LONG TERM GOAL #5   Title Pt will ambulate at least 215 ft in 2 minute walk test, for improved gait efficiency and safety.    Baseline 175 ft, 180' on 09/15/20    Time 4    Period Weeks    Status On-going                 Plan - 09/21/20 1211    Clinical Impression Statement Today's skilled session focused on stair training, activities for incr foot clearance, and balance. Pt taking incr time for sit <> stand transfers and needs initial cues for proper technique (pt still with tendency to put both hands on rollator). Pt able to tolerate session with minimal rest breaks. Will continue to progress towards LTGs.    Personal Factors and Comorbidities Comorbidity 3+    Comorbidities PMH:  HTN, DM, hypothyroidism, orthostatic hypotension    Examination-Activity Limitations Locomotion Level;Stand;Transfers     Examination-Participation Restrictions Community Activity;Other   Plans for travel in retirement   Stability/Clinical Decision Making Evolving/Moderate complexity    Rehab Potential Good    PT Frequency 2x / week    PT Duration 4 weeks    PT Treatment/Interventions ADLs/Self Care Home Management;DME Instruction;Neuromuscular re-education;Balance training;Therapeutic exercise;Therapeutic activities;Functional mobility training;Gait training;Patient/family education;Manual techniques    PT Next Visit Plan how is HEP and walking program? NuStep.  Continue standing exercises at countertop. functional strengthening/step ups and stair training.  TUG shuttle/turn training.    Consulted and Agree with Plan of Care Patient    Family Member Consulted husband, Marden Noble           Patient will benefit from skilled therapeutic intervention in order to improve the following deficits and impairments:  Abnormal gait,Difficulty walking,Decreased balance,Decreased mobility,Decreased strength,Postural dysfunction  Visit Diagnosis: Other symptoms and signs involving the nervous system  Other symptoms and signs involving the musculoskeletal system  Abnormal posture  Unsteadiness on feet     Problem List Patient Active Problem List   Diagnosis Date Noted  . Pain due to onychomycosis of toenails of both feet 06/17/2020  . Parkinson's disease ([place])  04/23/2020  . Leg swelling 11/26/2019  . Chronic venous insufficiency 11/26/2019  . Vitamin B12 deficiency 02/06/2018  . Sepsis secondary to UTI ([place]) 01/29/2018  . Microcytic anemia 01/29/2018  . Renal insufficiency 01/29/2018  . Pure hypercholesterolemia 12/24/2014  . Diabetes mellitus type 2, controlled, without complications ([place]) 65/46/5035  . Bruit of right carotid artery 12/24/2014  . Thyroid disease 07/06/2014  . Severe obesity (BMI >= 40) ([place]) 07/06/2014  . HTN (hypertension) 07/06/2014    Arliss Journey, PT, DPT  09/21/2020, 12:15 PM  [facility] [address] [place], [place], [address] Phone: [phone]   Fax:  [phone]  Name: Anna Bernard MRN: [id number] Date of Birth: 15-Oct-1950

## 2020-09-21 NOTE — Patient Instructions (Addendum)
  Performing Daily Activities with Big Movements  Pick at least 2 activities a day and perform with BIG, DELIBERATE movements/effort.  Try different activities each day. This can make the activity easier and turn daily activities into exercise to prevent problems in the future!  If you are standing during the activity, make sure to keep feet apart and stand with good/big/PWR! UP posture.  Examples:  Dressing - Push arms in sleeves, push foot into pants, open hands to pull down shirt/put on socks/pull up pants, scrunch socks/pull over shirts  Jacket - To take off, twist/look back and pull off left shoulder, then twist and pull off right shoulder.  To put on, push arm in right sleeve first and pull all the way on shoulder, then twist to reach and put left in.  Buttoning - Open hands big (PWR! Hands) before fastening each button  Bathing - Wash/dry with long strokes  Brushing your teeth - Big, slow movements  Cutting food - Long deliberate cuts  Eating - Hold utensil in the middle, not the end  Picking up a cup/bottle - Open hand up big and get object all the way in palm  Opening jar/bottle - Move as much as you can with each turn  Putting on seatbelt - Twist when reaching  Hanging up clothes/getting clothes down from closet - Reach with big effort  Putting away groceries/dishes - Reach with big effort  Wiping counter/table - Move in big, long strokes  Stirring while cooking - Exaggerate movement  Cleaning windows - Move in big, long strokes  Sweeping - Move arms in big, long strokes  Vacuuming - Push with big movement  Folding clothes - Exaggerate arm movements  Washing car - Move in big, long strokes  Raking - Move arms in big, long strokes  Changing light bulb - Move as much as you can with each turn  Using a screwdriver - Move as much as you can with each turn  Walking into a store/restaurant - Walk with big steps, swing arms if able  Standing up from a  chair/recliner/sofa - Scoot forward, lean forward, and stand with big effort

## 2020-09-23 ENCOUNTER — Encounter: Payer: Self-pay | Admitting: Occupational Therapy

## 2020-09-23 ENCOUNTER — Ambulatory Visit: Payer: Medicare Other | Admitting: Occupational Therapy

## 2020-09-23 ENCOUNTER — Other Ambulatory Visit: Payer: Self-pay

## 2020-09-23 ENCOUNTER — Ambulatory Visit: Payer: Medicare Other | Admitting: Physical Therapy

## 2020-09-23 ENCOUNTER — Encounter: Payer: Self-pay | Admitting: Physical Therapy

## 2020-09-23 DIAGNOSIS — M6281 Muscle weakness (generalized): Secondary | ICD-10-CM | POA: Diagnosis not present

## 2020-09-23 DIAGNOSIS — R29898 Other symptoms and signs involving the musculoskeletal system: Secondary | ICD-10-CM

## 2020-09-23 DIAGNOSIS — M25621 Stiffness of right elbow, not elsewhere classified: Secondary | ICD-10-CM

## 2020-09-23 DIAGNOSIS — R2681 Unsteadiness on feet: Secondary | ICD-10-CM | POA: Diagnosis not present

## 2020-09-23 DIAGNOSIS — R29818 Other symptoms and signs involving the nervous system: Secondary | ICD-10-CM | POA: Diagnosis not present

## 2020-09-23 DIAGNOSIS — R278 Other lack of coordination: Secondary | ICD-10-CM | POA: Diagnosis not present

## 2020-09-23 DIAGNOSIS — M25611 Stiffness of right shoulder, not elsewhere classified: Secondary | ICD-10-CM | POA: Diagnosis not present

## 2020-09-23 DIAGNOSIS — R293 Abnormal posture: Secondary | ICD-10-CM

## 2020-09-23 DIAGNOSIS — R2689 Other abnormalities of gait and mobility: Secondary | ICD-10-CM | POA: Diagnosis not present

## 2020-09-23 NOTE — Therapy (Signed)
[facility] [address] [place], [place], [address] Phone: [phone]   Fax:  [phone]  Physical Therapy Treatment  Patient Details  Name: Anna Bernard MRN: [id number] Date of Birth: 06/07/1951 Referring Provider (PT): Alonza Bogus, DO   Encounter Date: 09/23/2020   PT End of Session - 09/23/20 1159    Visit Number 19    Number of Visits 25    Date for PT Re-Evaluation 97/94/80   per re-cert on 16/55/37   Authorization Type UHC Medicare    Progress Note Due on Visit 20    PT Start Time 1102    PT Stop Time 1145    PT Time Calculation (min) 43 min    Equipment Utilized During Treatment Gait belt    Activity Tolerance Patient tolerated treatment well    Behavior During Therapy [facility] for tasks assessed/performed;Flat affect           Past Medical History:  Diagnosis Date  . Anemia, unspecified   . Hyperlipidemia, unspecified   . Hypertension   . Hypothyroidism, unspecified   . Pre-diabetes   . Vitamin B12 deficiency     Past Surgical History:  Procedure Laterality Date  . ABDOMINAL HYSTERECTOMY    . GIVENS CAPSULE STUDY N/A 04/16/2018   Procedure: GIVENS CAPSULE STUDY;  Surgeon: Carol Ada, MD;  Location: [facility];  Service: Endoscopy;  Laterality: N/A;  . PARS PLANA VITRECTOMY Right 08/16/2020   Procedure: 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;  Surgeon: Jalene Mullet, MD;  Location: [facility];  Service: Ophthalmology;  Laterality: Right;  . PHOTOCOAGULATION WITH LASER Right 08/16/2020   Procedure: PHOTOCOAGULATION WITH LASER;  Surgeon: Jalene Mullet, MD;  Location: [facility];  Service: Ophthalmology;  Laterality: Right;  . SHOULDER SURGERY    . TONSILLECTOMY    . TUBAL LIGATION      There were no vitals filed for this visit.   Subjective Assessment - 09/23/20 1104    Subjective No falls. No changes since she was last here. Determined to get better.    Patient  is accompained by: Family member   [name], husband   Pertinent History Dx with Parkinson's disease (Dr. Carles Collet) 04/2020 and Sinemet started; PMH of HTN, DM, hypothyroidism, orthostatic hypotension    Patient Stated Goals Get my walk back, be able to do things again.    Currently in Pain? No/denies    Pain Onset More than a month ago                             [facility] Adult PT Treatment/Exercise - 09/23/20 1115      Transfers   Transfers Sit to Stand;Stand to Sit    Sit to Stand 5: Supervision;With upper extremity assist;From bed;From chair/3-in-1    Sit to Stand Details Verbal cues for sequencing;Verbal cues for technique;Visual cues for safe use of DME/AE;Visual cues/gestures for sequencing    Stand to Sit 5: Supervision;To chair/3-in-1;With upper extremity assist;To bed    Stand to Sit Details (indicate cue type and reason) Verbal cues for sequencing;Verbal cues for technique    Comments cues for turning technique with rollator when backing up to sit down on mat table      Ambulation/Gait   Ambulation/Gait Assistance 5: Supervision    Ambulation/Gait Assistance Details 2MWT at start of session: 230'. when more fatigued pt demonstrating decr R foot clearance and scuffing at times. pt needing  intermittent cues to stay close to rollator    Ambulation Distance (Feet) 230 Feet   115 x 1   Assistive device Rollator    Gait Pattern Step-through pattern;Decreased step length - right;Decreased step length - left;Decreased stance time - right;Decreased stance time - left;Decreased dorsiflexion - right;Decreased dorsiflexion - left;Shuffle;Poor foot clearance - left;Poor foot clearance - right    Ambulation Surface Level;Indoor    Gait Comments after 2MWT: HR 90 bpm, O2 at 100%               Balance Exercises - 09/23/20 0001      Balance Exercises: Standing   Standing Eyes Opened Wide (BOA);Head turns;Limitations    Standing Eyes Opened Limitations at countertop without UE  support 2 x 10 reps head turns with min guard for balance    Heel Raises Both;10 reps   BUE support at counter   Other Standing Exercises at countertop with BUE support:wide BOS weight shifting and lifting contralateral leg for dynamic SLS x10 reps B           Pt performs PWR! Moves in seated position,et edge of mat table with aerobic step under BLE:   PWR! Up for improved posturex10 reps. Cues for technique.  PWR! Rock for improved weighshiftingx 7 reps B, cues for reaching arm up and in and performing with big hands and spreading fingers wide  PWR! Twist for improved trunk rotation - performed a modified version with pt twisting with opposite arm and bringing it to side of knee for trunk rotation x7 reps B  PWR! Step for improved step initiationx 10 reps, single step out and in. Performed with 10 additional reps B using 2" foam on floor to encourage increased step height.   Verbal and demo cues for proper technique.      PT Short Term Goals - 08/25/20 1120      PT SHORT TERM GOAL #1   Title Pt will perform HEP with family supervision, for improved balance, stregnth, transfers, and gait.  TARGET 08/14/2020    Baseline Not currently performing HEP, due to unplanned eye surgery    Time 5    Period Weeks    Status Not Met      PT SHORT TERM GOAL #2   Title Pt will improve 5x sit<>stand to less than or equal to 20 seconds for improved transfer efficiency and functional strength.    Baseline 08/10/20-23.66 seconds    Time 5    Period Weeks    Status Not Met      PT SHORT TERM GOAL #3   Title Berg score to be assessed, with Berg score improved by at least 5 points for decreased fall risk.    Baseline 28/56 on 08/10/20    Time 5    Period Weeks    Status Achieved      PT SHORT TERM GOAL #4   Title Pt will improve TUG score to less than or equal to 20 seconds for decreased fall risk.    Baseline time increased to 34.72 on 08/10/20   performed at end of session and pt  reports fatigue and unwilling to perform 2nd attempts   Time 5    Period Weeks    Status Not Met      PT SHORT TERM GOAL #5   Title Pt/husband will verbalize understanding of fall prevention in home environment.    Time 5    Period Weeks    Status Achieved  PT Long Term Goals - 09/17/20 1651      PT LONG TERM GOAL #1   Title Pt will perform progression of HEPand walking program, including verbalizing plans for optimal fitness plan upon d/c from PT.  TARGET 10/15/20    Baseline reviewed HEP, and addition of sit <> stands    Time 4    Period Weeks    Status On-going    Target Date 10/15/20      PT LONG TERM GOAL #2   Title Pt will improve 5x sit<>stand in less than or equal to 20 seconds for improved transfer efficiency and functional strength.    Baseline 26.38 seconds one and on rollator and one hand pushing up    Time 4    Period Weeks    Status Revised      PT LONG TERM GOAL #3   Title Pt will improve gait velocity to at least 2.3 ft/sec for improved gait efficiency and safety.    Baseline 1.27 ft/sec, 15.63 seconds = 2.09 ft/sec with rollator    Time 4    Period Weeks    Status Revised      PT LONG TERM GOAL #4   Title Pt will improve TUG score to less than or equal to 30 seconds for decreased fall risk.    Baseline 45.13 seconds with rollator on 09/15/20 2nd attempt    Time 4    Period Weeks    Status Revised      PT LONG TERM GOAL #5   Title Pt will ambulate at least 215 ft in 2 minute walk test, for improved gait efficiency and safety.    Baseline 175 ft, 180' on 09/15/20    Time 4    Period Weeks    Status On-going                 Plan - 09/23/20 1208    Clinical Impression Statement Pt improved distance today on 2MWT to 230' (last week was 180') with rollator, with pt reporting that she has been working on her walking program at home. Also performed seated PWR moves again today (last time performed was prior to pt's eye surgery), with  pt needing verbal and demo cues for intensity/technique. Pt reporting feeling good after performing. Will continue to progress towards LTGs.    Personal Factors and Comorbidities Comorbidity 3+    Comorbidities PMH:  HTN, DM, hypothyroidism, orthostatic hypotension    Examination-Activity Limitations Locomotion Level;Stand;Transfers    Examination-Participation Restrictions Community Activity;Other   Plans for travel in retirement   Stability/Clinical Decision Making Evolving/Moderate complexity    Rehab Potential Good    PT Frequency 2x / week    PT Duration 4 weeks    PT Treatment/Interventions ADLs/Self Care Home Management;DME Instruction;Neuromuscular re-education;Balance training;Therapeutic exercise;Therapeutic activities;Functional mobility training;Gait training;Patient/family education;Manual techniques    PT Next Visit Plan gait for endurance. NuStep.  Continue standing exercises at countertop. functional strengthening/step ups and stair training.  activities for incr R foot clearance. TUG shuttle/turn training.    Consulted and Agree with Plan of Care Patient    Family Member Consulted husband, Marden Noble           Patient will benefit from skilled therapeutic intervention in order to improve the following deficits and impairments:  Abnormal gait,Difficulty walking,Decreased balance,Decreased mobility,Decreased strength,Postural dysfunction  Visit Diagnosis: Other symptoms and signs involving the nervous system  Other symptoms and signs involving the musculoskeletal system  Abnormal posture  Other lack of  coordination     Problem List Patient Active Problem List   Diagnosis Date Noted  . Pain due to onychomycosis of toenails of both feet 06/17/2020  . Parkinson's disease ([place]) 04/23/2020  . Leg swelling 11/26/2019  . Chronic venous insufficiency 11/26/2019  . Vitamin B12 deficiency 02/06/2018  . Sepsis secondary to UTI ([place]) 01/29/2018  . Microcytic anemia 01/29/2018   . Renal insufficiency 01/29/2018  . Pure hypercholesterolemia 12/24/2014  . Diabetes mellitus type 2, controlled, without complications ([place]) 68/15/9470  . Bruit of right carotid artery 12/24/2014  . Thyroid disease 07/06/2014  . Severe obesity (BMI >= 40) ([place]) 07/06/2014  . HTN (hypertension) 07/06/2014    Arliss Journey, PT, DPT 09/23/2020, 12:11 PM  [facility] [address] [place], [place], [address] Phone: [phone]   Fax:  [phone]  Name: Anna Bernard MRN: [id number] Date of Birth: 29-Dec-1950

## 2020-09-23 NOTE — Therapy (Signed)
[facility] [address] [place], [place], [address] Phone: [phone]   Fax:  [phone]  Occupational Therapy Treatment  Patient Details  Name: Anna Bernard MRN: [id number] Date of Birth: 21-Mar-1951 Referring Provider (OT): Dr. Wells Guiles Tat   Encounter Date: 09/23/2020   OT End of Session - 09/23/20 1023    Visit Number 14    Number of Visits 17    Date for OT Re-Evaluation 09/11/20    Authorization Type Medicare UHC:  no visit limit or auth required    Authorization Time Period cert. date 07/13/20-10/11/20, week 8/8    Authorization - Visit Number 14    Authorization - Number of Visits 20    Progress Note Due on Visit 20    OT Start Time 1018    OT Stop Time 1100    OT Time Calculation (min) 42 min    Activity Tolerance Patient tolerated treatment well    Behavior During Therapy WFL for tasks assessed/performed;Flat affect           Past Medical History:  Diagnosis Date  . Anemia, unspecified   . Hyperlipidemia, unspecified   . Hypertension   . Hypothyroidism, unspecified   . Pre-diabetes   . Vitamin B12 deficiency     Past Surgical History:  Procedure Laterality Date  . ABDOMINAL HYSTERECTOMY    . GIVENS CAPSULE STUDY N/A 04/16/2018   Procedure: GIVENS CAPSULE STUDY;  Surgeon: Carol Ada, MD;  Location: [facility];  Service: Endoscopy;  Laterality: N/A;  . PARS PLANA VITRECTOMY Right 08/16/2020   Procedure: 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;  Surgeon: Jalene Mullet, MD;  Location: [facility];  Service: Ophthalmology;  Laterality: Right;  . PHOTOCOAGULATION WITH LASER Right 08/16/2020   Procedure: PHOTOCOAGULATION WITH LASER;  Surgeon: Jalene Mullet, MD;  Location: [facility];  Service: Ophthalmology;  Laterality: Right;  . SHOULDER SURGERY    . TONSILLECTOMY    . TUBAL LIGATION      There were no vitals filed for this visit.   Subjective Assessment -  09/23/20 1023    Subjective  Pt reports no falls    Pertinent History newly diagnosed PD (in last month).   PMH:  anemia, hyperlipidemia, HTN, hypothyroidism, Vitamin B12 deficiency, hx of R RTC surgery, leg swelling, arthritis (per pt), obesity, orthostatic hypotension    Limitations fall risk    Patient Stated Goals be able to move better and travel (to [place] first)    Currently in Pain? No/denies                Treatment: Reveiwed bag exercises, mod v.c/ min facilitation Arm bike x 5 mins level 1 for reciprocal movement, min v.c for speed , pt maintained 15-20 rpm with min v.c Started checking progress towards long term goals, increased time required for all activities due to bradykinesia. Anticipate renewing next visit.                OT Education - 09/23/20 1035    Education Details Bag exercise review    Person(s) Educated Patient    Methods Explanation;Demonstration;Verbal cues;Tactile cues    Comprehension Verbalized understanding;Returned demonstration;Verbal cues required            OT Short Term Goals - 09/09/20 1229      OT SHORT TERM GOAL #1   Title Pt will be independent with PD-specific HEP--check STGs 09/11/20    Time 4    Period Weeks  Status On-going   09/01/20:  needs cueing/reinforcement     OT SHORT TERM GOAL #2   Title Pt will demo at least 110* R shoulder flex with -40* elbow ext for functional reach.    Baseline 100* with -45* elbow ext    Time 4    Period Weeks    Status Achieved   09/01/20:  110* with -30* elbow ext     OT SHORT TERM GOAL #3   Title Pt will improve R hand coordination for ADLs as shown by improving time on 9-hole peg test by at least 6sec.    Baseline 56.84sec    Time 4    Period Weeks    Status On-going   09/08/20:  61sec     OT SHORT TERM GOAL #4   Title Pt willdemonstrate improved ease with dressing as evidenced by  performing  PPT#4 donning/ doffing jacket in 1 min 15 secs    Baseline 1 min 29 secs     Time 4    Period Weeks    Status On-going   09/08/20:  >2mn     OT SHORT TERM GOAL #5   Title Pt will verbalize understanding of appropriate community resources and ways to decr risk of future complications.    Time 4    Period Weeks    Status On-going   09/08/20:  not fully met (began education regarding ways to decr future complications), continue goal            OT Long Term Goals - 09/23/20 1040      OT LONG TERM GOAL #1   Title Pt will verbalize understanding of adaptive strategies to incr ease, safety, and independence with ADLs and IADLs.--check LTGs 10/11/20    Time 8    Period Weeks    Status On-going      OT LONG TERM GOAL #2   Title Pt will demo at least 115* R shoulder flex with -35* elbow ext for functional reach.    Baseline 100* with -45* elbow ext    Time 8    Period Weeks    Status On-going   90, -50     OT LONG TERM GOAL #3   Title Pt will perform UB dressing consistently mod I.    Time 8    Period Weeks    Status On-going   minA, not consistently performing mod I     OT LONG TERM GOAL #4   Title Pt will perform LB dressing consistently mod I.    Time 8    Period Weeks    Status On-going   mod A     OT LONG TERM GOAL #5   Title Pt will improve R hand coordination for ADLs as shown by improving time on 9-hole peg test by at least 10sec.    Baseline 56.84sec    Time 8    Period Weeks    Status New      OT LONG TERM GOAL #6   Title Pt will improved RUE functional reaching and coordination for ADLs as shown by improving score on box and blocks test by at least 5 blocks.    Baseline R-31 blocks, L-40 blocks    Time 8    Period Weeks    Status On-going   31 blocks on 09/23/20                 Patient will benefit from skilled therapeutic intervention in order to improve  the following deficits and impairments:           Visit Diagnosis: Other symptoms and signs involving the nervous system  Other symptoms and signs involving the  musculoskeletal system  Abnormal posture  Other lack of coordination  Stiffness of right elbow, not elsewhere classified  Stiffness of right shoulder, not elsewhere classified  Muscle weakness (generalized)  Other abnormalities of gait and mobility    Problem List Patient Active Problem List   Diagnosis Date Noted  . Pain due to onychomycosis of toenails of both feet 06/17/2020  . Parkinson's disease ([place]) 04/23/2020  . Leg swelling 11/26/2019  . Chronic venous insufficiency 11/26/2019  . Vitamin B12 deficiency 02/06/2018  . Sepsis secondary to UTI ([place]) 01/29/2018  . Microcytic anemia 01/29/2018  . Renal insufficiency 01/29/2018  . Pure hypercholesterolemia 12/24/2014  . Diabetes mellitus type 2, controlled, without complications ([facility]) 05/69/7948  . Bruit of right carotid artery 12/24/2014  . Thyroid disease 07/06/2014  . Severe obesity (BMI >= 40) ([place]) 07/06/2014  . HTN (hypertension) 07/06/2014    Tonimarie Gritz 09/23/2020, 10:57 AM  [facility] [address] [place], [place], [address] Phone: [phone]   Fax:  [phone]  Name: REEDA SOOHOO MRN: [id number] Date of Birth: 1951-07-05

## 2020-09-28 ENCOUNTER — Encounter (HOSPITAL_COMMUNITY): Payer: Self-pay | Admitting: Ophthalmology

## 2020-09-28 DIAGNOSIS — H44431 Hypotony of eye due to other ocular disorders, right eye: Secondary | ICD-10-CM | POA: Diagnosis not present

## 2020-09-28 DIAGNOSIS — H4311 Vitreous hemorrhage, right eye: Secondary | ICD-10-CM | POA: Diagnosis not present

## 2020-09-28 DIAGNOSIS — H35032 Hypertensive retinopathy, left eye: Secondary | ICD-10-CM | POA: Diagnosis not present

## 2020-09-28 NOTE — Progress Notes (Signed)
PCP - pt does not know PCP's name - she was just referred to one recently Cardiologist - denies  Chest x-ray - n/a EKG - 08/17/20 Stress Test - denies ECHO - 08/05/19 Cardiac Cath - denies  Aspirin Instructions: no ASA DOS  COVID TEST- DOS  Anesthesia review: n/a  -------------  SDW INSTRUCTIONS:  Your procedure is scheduled on 09/29/20. Please report to [facility] Main Entrance "A" at 05:30 A.M., and check in at the Admitting office. Call this number if you have problems the morning of surgery: [phone]   Remember: Do not eat or drink after midnight the night before your surgery  Medications to take morning of surgery with a sip of water include: carbidopa-levodopa (SINEMET IR)  levothyroxine (SYNTHROID)  rosuvastatin (CRESTOR)  trimethoprim (TRIMPEX)   As of today, STOP taking any Aspirin (unless otherwise instructed by your surgeon), Aleve, Naproxen, Ibuprofen, Motrin, Advil, Goody's, BC's, all herbal medications, fish oil, and all vitamins.    The Morning of Surgery Do not wear jewelry, make-up or nail polish. Do not wear lotions, powders, or perfumes, or deodorant Do not shave 48 hours prior to surgery.   Do not bring valuables to the hospital. [facility] is not responsible for any belongings or valuables. If you are a smoker, DO NOT Smoke 24 hours prior to surgery If you wear a CPAP at night please bring your mask the morning of surgery  Remember that you must have someone to transport you home after your surgery, and remain with you for 24 hours if you are discharged the same day. Please bring cases for contacts, glasses, hearing aids, dentures or bridgework because it cannot be worn into surgery.   Patients discharged the day of surgery will not be allowed to drive home.   Please shower the NIGHT BEFORE SURGERY and the MORNING OF SURGERY with DIAL Soap. Wear comfortable clothes the morning of surgery. Oral Hygiene is also important to reduce your risk of  infection.  Remember - BRUSH YOUR TEETH THE MORNING OF SURGERY WITH YOUR REGULAR TOOTHPASTE  Patient denies shortness of breath, fever, cough and chest pain.

## 2020-09-29 ENCOUNTER — Ambulatory Visit: Payer: Medicare Other | Admitting: Occupational Therapy

## 2020-09-29 ENCOUNTER — Ambulatory Visit: Payer: Medicare Other | Admitting: Physical Therapy

## 2020-09-29 ENCOUNTER — Encounter (HOSPITAL_COMMUNITY)
Admission: RE | Disposition: A | Discharge status: Home / Self Care | Payer: Self-pay | Source: Home / Self Care | Attending: Ophthalmology

## 2020-09-29 ENCOUNTER — Ambulatory Visit (HOSPITAL_COMMUNITY): Payer: Medicare Other | Admitting: Certified Registered Nurse Anesthetist

## 2020-09-29 ENCOUNTER — Other Ambulatory Visit: Payer: Self-pay

## 2020-09-29 ENCOUNTER — Encounter (HOSPITAL_COMMUNITY): Payer: Self-pay | Admitting: Ophthalmology

## 2020-09-29 ENCOUNTER — Ambulatory Visit (HOSPITAL_COMMUNITY)
Admission: RE | Admit: 2020-09-29 | Discharge: 2020-09-29 | Disposition: A | Discharge status: Home / Self Care | Payer: Medicare Other | Attending: Ophthalmology | Admitting: Ophthalmology

## 2020-09-29 DIAGNOSIS — Z20822 Contact with and (suspected) exposure to covid-19: Secondary | ICD-10-CM | POA: Insufficient documentation

## 2020-09-29 DIAGNOSIS — R7303 Prediabetes: Secondary | ICD-10-CM | POA: Insufficient documentation

## 2020-09-29 DIAGNOSIS — Z841 Family history of disorders of kidney and ureter: Secondary | ICD-10-CM | POA: Insufficient documentation

## 2020-09-29 DIAGNOSIS — E7800 Pure hypercholesterolemia, unspecified: Secondary | ICD-10-CM | POA: Diagnosis not present

## 2020-09-29 DIAGNOSIS — H4311 Vitreous hemorrhage, right eye: Secondary | ICD-10-CM | POA: Insufficient documentation

## 2020-09-29 DIAGNOSIS — E119 Type 2 diabetes mellitus without complications: Secondary | ICD-10-CM | POA: Diagnosis not present

## 2020-09-29 DIAGNOSIS — Z809 Family history of malignant neoplasm, unspecified: Secondary | ICD-10-CM | POA: Insufficient documentation

## 2020-09-29 DIAGNOSIS — I10 Essential (primary) hypertension: Secondary | ICD-10-CM | POA: Diagnosis not present

## 2020-09-29 HISTORY — PX: PHOTOCOAGULATION WITH LASER: SHX6027

## 2020-09-29 HISTORY — PX: PARS PLANA VITRECTOMY: SHX2166

## 2020-09-29 LAB — BASIC METABOLIC PANEL
Anion gap: 10 (ref 5–15)
BUN: 15 mg/dL (ref 8–23)
CO2: 23 mmol/L (ref 22–32)
Calcium: 8.8 mg/dL — ABNORMAL LOW (ref 8.9–10.3)
Chloride: 105 mmol/L (ref 98–111)
Creatinine, Ser: 0.63 mg/dL (ref 0.44–1.00)
GFR, Estimated: >60 mL/min (ref >60)
Glucose, Bld: 71 mg/dL (ref 70–99)
Potassium: 4.6 mmol/L (ref 3.5–5.1)
Sodium: 138 mmol/L (ref 135–145)

## 2020-09-29 LAB — CBC
HCT: 28.8 % — ABNORMAL LOW (ref 36.0–46.0)
Hemoglobin: 9.2 g/dL — ABNORMAL LOW (ref 12.0–15.0)
MCH: 23.7 pg — ABNORMAL LOW (ref 26.0–34.0)
MCHC: 31.9 g/dL (ref 30.0–36.0)
MCV: 74.2 fL — ABNORMAL LOW (ref 80.0–100.0)
Platelets: 315 10*3/uL (ref 150–400)
RBC: 3.88 MIL/uL (ref 3.87–5.11)
RDW: 15.5 % (ref 11.5–15.5)
WBC: 5.3 10*3/uL (ref 4.0–10.5)
nRBC: 0 % (ref 0.0–0.2)

## 2020-09-29 LAB — SARS CORONAVIRUS 2 BY RT PCR (HOSPITAL ORDER, PERFORMED IN ~~LOC~~ HOSPITAL LAB): SARS Coronavirus 2: NEGATIVE

## 2020-09-29 LAB — GLUCOSE, CAPILLARY
Glucose-Capillary: 111 mg/dL — ABNORMAL HIGH (ref 70–99)
Glucose-Capillary: 60 mg/dL — ABNORMAL LOW (ref 70–99)

## 2020-09-29 SURGERY — PARS PLANA VITRECTOMY WITH 25 GAUGE
Anesthesia: Monitor Anesthesia Care | Site: Eye | Laterality: Right

## 2020-09-29 MED ORDER — LIDOCAINE HCL 2 % IJ SOLN
INTRAMUSCULAR | Status: AC
  Filled 2020-09-29: qty 20

## 2020-09-29 MED ORDER — LIDOCAINE HCL 2 % IJ SOLN
INTRAMUSCULAR | Status: DC | PRN
  Administered 2020-09-29: 6 mL via RETROBULBAR

## 2020-09-29 MED ORDER — INDOCYANINE GREEN 25 MG IV SOLR
INTRAVENOUS | Status: AC
  Filled 2020-09-29: qty 10

## 2020-09-29 MED ORDER — SODIUM CHLORIDE 0.9 % IV SOLN: INTRAVENOUS | Status: DC | PRN

## 2020-09-29 MED ORDER — PROPOFOL 500 MG/50ML IV EMUL: INTRAVENOUS | Status: DC | PRN

## 2020-09-29 MED ORDER — HYALURONIDASE HUMAN 150 UNIT/ML IJ SOLN
INTRAMUSCULAR | Status: AC
  Filled 2020-09-29: qty 1

## 2020-09-29 MED ORDER — PHENYLEPHRINE HCL 2.5 % OP SOLN
1.0000 [drp] | OPHTHALMIC | Status: AC | PRN
  Administered 2020-09-29 (×3): 1 [drp] via OPHTHALMIC
  Filled 2020-09-29: qty 2

## 2020-09-29 MED ORDER — HYPROMELLOSE (GONIOSCOPIC) 2.5 % OP SOLN
OPHTHALMIC | Status: AC
  Filled 2020-09-29: qty 15

## 2020-09-29 MED ORDER — HYPROMELLOSE (GONIOSCOPIC) 2.5 % OP SOLN
OPHTHALMIC | Status: DC | PRN
  Administered 2020-09-29: 1 [drp] via OPHTHALMIC

## 2020-09-29 MED ORDER — BSS PLUS IO SOLN
INTRAOCULAR | Status: AC
  Filled 2020-09-29: qty 500

## 2020-09-29 MED ORDER — EPINEPHRINE PF 1 MG/ML IJ SOLN
INTRAOCULAR | Status: DC | PRN
  Administered 2020-09-29: 3 mL

## 2020-09-29 MED ORDER — DEXAMETHASONE SODIUM PHOSPHATE 10 MG/ML IJ SOLN
INTRAMUSCULAR | Status: AC
  Filled 2020-09-29: qty 1

## 2020-09-29 MED ORDER — ATROPINE SULFATE 1 % OP SOLN
OPHTHALMIC | Status: AC
  Filled 2020-09-29: qty 5

## 2020-09-29 MED ORDER — SODIUM CHLORIDE 0.9 % IV SOLN: INTRAVENOUS | Status: DC

## 2020-09-29 MED ORDER — LIDOCAINE HCL (CARDIAC) PF 100 MG/5ML IV SOSY
PREFILLED_SYRINGE | INTRAVENOUS | Status: DC | PRN
  Administered 2020-09-29: 30 mg via INTRAVENOUS

## 2020-09-29 MED ORDER — TROPICAMIDE 1 % OP SOLN
1.0000 [drp] | OPHTHALMIC | Status: AC | PRN
  Administered 2020-09-29 (×3): 1 [drp] via OPHTHALMIC
  Filled 2020-09-29: qty 15

## 2020-09-29 MED ORDER — CEFAZOLIN SUBCONJUNCTIVAL INJECTION 100 MG/0.5 ML
100.0000 mg | INJECTION | SUBCONJUNCTIVAL | Status: DC
  Filled 2020-09-29 (×2): qty 5

## 2020-09-29 MED ORDER — FENTANYL CITRATE (PF) 250 MCG/5ML IJ SOLN
INTRAMUSCULAR | Status: AC
  Filled 2020-09-29: qty 5

## 2020-09-29 MED ORDER — FENTANYL CITRATE (PF) 100 MCG/2ML IJ SOLN
25.0000 ug | INTRAMUSCULAR | Status: DC | PRN
Start: 2020-09-29 — End: 2020-09-30

## 2020-09-29 MED ORDER — BSS IO SOLN
INTRAOCULAR | Status: AC
  Filled 2020-09-29: qty 15

## 2020-09-29 MED ORDER — OFLOXACIN 0.3 % OP SOLN
1.0000 [drp] | OPHTHALMIC | Status: AC | PRN
  Administered 2020-09-29 (×2): 1 [drp] via OPHTHALMIC
  Filled 2020-09-29: qty 5

## 2020-09-29 MED ORDER — DEXAMETHASONE SODIUM PHOSPHATE 10 MG/ML IJ SOLN
INTRAMUSCULAR | Status: DC | PRN
  Administered 2020-09-29: 10 mg

## 2020-09-29 MED ORDER — ONDANSETRON HCL 4 MG/2ML IJ SOLN
INTRAMUSCULAR | Status: DC | PRN
  Administered 2020-09-29: 4 mg via INTRAVENOUS

## 2020-09-29 MED ORDER — AMISULPRIDE (ANTIEMETIC) 5 MG/2ML IV SOLN: 10.0000 mg | Freq: Once | INTRAVENOUS | Status: DC | PRN

## 2020-09-29 MED ORDER — TOBRAMYCIN-DEXAMETHASONE 0.3-0.1 % OP OINT
TOPICAL_OINTMENT | OPHTHALMIC | Status: AC
  Filled 2020-09-29: qty 3.5

## 2020-09-29 MED ORDER — MIDAZOLAM HCL 2 MG/2ML IJ SOLN
INTRAMUSCULAR | Status: AC
  Filled 2020-09-29: qty 2

## 2020-09-29 MED ORDER — EPINEPHRINE PF 1 MG/ML IJ SOLN
INTRAMUSCULAR | Status: AC
  Filled 2020-09-29: qty 1

## 2020-09-29 MED ORDER — TOBRAMYCIN-DEXAMETHASONE 0.3-0.1 % OP OINT
TOPICAL_OINTMENT | OPHTHALMIC | Status: DC | PRN
  Administered 2020-09-29: 1 via OPHTHALMIC

## 2020-09-29 MED ORDER — ORAL CARE MOUTH RINSE: 15.0000 mL | Freq: Once | OROMUCOSAL | Status: AC

## 2020-09-29 MED ORDER — PROPOFOL 10 MG/ML IV BOLUS
INTRAVENOUS | Status: AC
  Filled 2020-09-29: qty 20

## 2020-09-29 MED ORDER — PROPARACAINE HCL 0.5 % OP SOLN
1.0000 [drp] | OPHTHALMIC | Status: AC | PRN
  Administered 2020-09-29 (×3): 1 [drp] via OPHTHALMIC
  Filled 2020-09-29: qty 15

## 2020-09-29 MED ORDER — BUPIVACAINE HCL (PF) 0.75 % IJ SOLN
INTRAMUSCULAR | Status: AC
  Filled 2020-09-29: qty 10

## 2020-09-29 MED ORDER — PROPOFOL 10 MG/ML IV BOLUS
INTRAVENOUS | Status: DC | PRN
  Administered 2020-09-29: 30 ug via INTRAVENOUS

## 2020-09-29 MED ORDER — CEFAZOLIN SUBCONJUNCTIVAL INJECTION 100 MG/0.5 ML
INJECTION | SUBCONJUNCTIVAL | Status: DC | PRN
  Administered 2020-09-29: 100 mg via SUBCONJUNCTIVAL

## 2020-09-29 MED ORDER — CHLORHEXIDINE GLUCONATE 0.12 % MT SOLN
15.0000 mL | Freq: Once | OROMUCOSAL | Status: AC
  Administered 2020-09-29: 15 mL via OROMUCOSAL
  Filled 2020-09-29: qty 15

## 2020-09-29 SURGICAL SUPPLY — 56 items
APPLICATOR COTTON TIP 6 STRL (MISCELLANEOUS) ×1 IMPLANT
APPLICATOR COTTON TIP 6IN STRL (MISCELLANEOUS) ×2
BAND WRIST GAS GREEN (MISCELLANEOUS) IMPLANT
BLADE MVR KNIFE 20G (BLADE) IMPLANT
BLADE STAB KNIFE 15DEG (BLADE) IMPLANT
CABLE BIPOLOR RESECTION CORD (MISCELLANEOUS) ×2 IMPLANT
CANNULA ANT CHAM MAIN (OPHTHALMIC RELATED) IMPLANT
CANNULA DUAL BORE 23G (CANNULA) IMPLANT
CANNULA DUALBORE 25G (CANNULA) IMPLANT
CANNULA VLV SOFT TIP 25GA (OPHTHALMIC) ×2 IMPLANT
CAUTERY EYE LOW TEMP 1300F FIN (OPHTHALMIC RELATED) IMPLANT
CLSR STERI-STRIP ANTIMIC 1/2X4 (GAUZE/BANDAGES/DRESSINGS) ×2 IMPLANT
COVER MAYO STAND STRL (DRAPES) IMPLANT
DRAPE HALF SHEET 40X57 (DRAPES) ×2 IMPLANT
DRAPE INCISE 51X51 W/FILM STRL (DRAPES) IMPLANT
DRAPE RETRACTOR (MISCELLANEOUS) ×2 IMPLANT
FORCEPS ECKARDT ILM 25G SERR (OPHTHALMIC RELATED) IMPLANT
FORCEPS GRIESHABER ILM 25G A (INSTRUMENTS) IMPLANT
GAS AUTO FILL CONSTEL (OPHTHALMIC)
GAS AUTO FILL CONSTELLATION (OPHTHALMIC) IMPLANT
GAS WRIST BAND GREEN (MISCELLANEOUS)
GLOVE SURG SYN 7.5  E (GLOVE) ×1
GLOVE SURG SYN 7.5 E (GLOVE) ×1 IMPLANT
GOWN STRL REUS W/ TWL LRG LVL3 (GOWN DISPOSABLE) ×1 IMPLANT
GOWN STRL REUS W/TWL LRG LVL3 (GOWN DISPOSABLE) ×2
KIT BASIN OR (CUSTOM PROCEDURE TRAY) ×2 IMPLANT
KIT TURNOVER KIT B (KITS) ×2 IMPLANT
LENS BIOM SUPER VIEW SET DISP (MISCELLANEOUS) ×2 IMPLANT
MICROPICK 25G (MISCELLANEOUS)
NEEDLE 18GX1X1/2 (RX/OR ONLY) (NEEDLE) ×2 IMPLANT
NEEDLE 25GX 5/8IN NON SAFETY (NEEDLE) ×2 IMPLANT
NEEDLE FILTER BLUNT 18X 1/2SAF (NEEDLE) ×1
NEEDLE FILTER BLUNT 18X1 1/2 (NEEDLE) ×1 IMPLANT
NEEDLE HYPO 25GX1X1/2 BEV (NEEDLE) IMPLANT
NEEDLE HYPO 30X.5 LL (NEEDLE) ×4 IMPLANT
NEEDLE RETROBULBAR 25GX1.5 (NEEDLE) ×2 IMPLANT
NS IRRIG 1000ML POUR BTL (IV SOLUTION) ×2 IMPLANT
PACK FRAGMATOME (OPHTHALMIC) IMPLANT
PACK VITRECTOMY CUSTOM (CUSTOM PROCEDURE TRAY) ×2 IMPLANT
PAD ARMBOARD 7.5X6 YLW CONV (MISCELLANEOUS) ×4 IMPLANT
PAK PIK VITRECTOMY CVS 25GA (OPHTHALMIC) ×2 IMPLANT
PICK MICROPICK 25G (MISCELLANEOUS) IMPLANT
PROBE ENDO DIATHERMY 25G (MISCELLANEOUS) ×4 IMPLANT
PROBE LASER ILLUM FLEX CVD 25G (OPHTHALMIC) IMPLANT
ROLLS DENTAL (MISCELLANEOUS) IMPLANT
SCRAPER DIAMOND 25GA (OPHTHALMIC RELATED) ×2 IMPLANT
SOL ANTI FOG 6CC (MISCELLANEOUS) ×1 IMPLANT
SOLUTION ANTI FOG 6CC (MISCELLANEOUS) ×1
STOPCOCK 4 WAY LG BORE MALE ST (IV SETS) IMPLANT
SUT VICRYL 7 0 TG140 8 (SUTURE) ×2 IMPLANT
SUT VICRYL 8 0 TG140 8 (SUTURE) IMPLANT
SYR 10ML LL (SYRINGE) IMPLANT
SYR 20ML LL LF (SYRINGE) ×2 IMPLANT
SYR 5ML LL (SYRINGE) ×2 IMPLANT
SYR TB 1ML LUER SLIP (SYRINGE) IMPLANT
WATER STERILE IRR 1000ML POUR (IV SOLUTION) ×2 IMPLANT

## 2020-09-29 NOTE — Discharge Instructions (Addendum)
DO NOT SLEEP ON BACK, THE EYE PRESSURE CAN GO UP AND CAUSE VISION LOSS   SLEEP ON SIDE WITH NOSE TO PILLOW  DURING DAY KEEP UPRIGHT 

## 2020-09-29 NOTE — Anesthesia Preprocedure Evaluation (Signed)
Anesthesia Evaluation  Patient identified by MRN, date of birth, ID band Patient awake    Reviewed: Allergy & Precautions, NPO status , Patient's Chart, lab work & pertinent test results  Airway Mallampati: II  TM Distance: >3 FB Neck ROM: Full    Dental  (+) Dental Advisory Given   Pulmonary former smoker,    breath sounds clear to auscultation       Cardiovascular hypertension, Pt. on medications  Rhythm:Regular Rate:Normal     Neuro/Psych negative neurological ROS     GI/Hepatic negative GI ROS, Neg liver ROS,   Endo/Other  diabetes, Type 2Hypothyroidism   Renal/GU      Musculoskeletal   Abdominal   Peds  Hematology  (+) anemia ,   Anesthesia Other Findings   Reproductive/Obstetrics                             Anesthesia Physical Anesthesia Plan  ASA: III  Anesthesia Plan: MAC   Post-op Pain Management:    Induction:   PONV Risk Score and Plan: 2 and Propofol infusion, Ondansetron and Treatment may vary due to age or medical condition  Airway Management Planned: Natural Airway and Simple Face Mask  Additional Equipment:   Intra-op Plan:   Post-operative Plan:   Informed Consent: I have reviewed the patients History and Physical, chart, labs and discussed the procedure including the risks, benefits and alternatives for the proposed anesthesia with the patient or authorized representative who has indicated his/her understanding and acceptance.       Plan Discussed with:   Anesthesia Plan Comments:         Anesthesia Quick Evaluation

## 2020-09-29 NOTE — Anesthesia Procedure Notes (Signed)
Procedure Name: MAC Date/Time: 09/29/2020 7:15 PM Performed by: Eligha Bridegroom, CRNA Pre-anesthesia Checklist: Patient identified, Emergency Drugs available, Suction available, Patient being monitored and Timeout performed Patient Re-evaluated:Patient Re-evaluated prior to induction Oxygen Delivery Method: Nasal cannula Preoxygenation: Pre-oxygenation with 100% oxygen

## 2020-09-29 NOTE — H&P (Signed)
Date of examination:  09/29/20  Indication for surgery: vitreous hemorrhage right eye  Pertinent past medical history:  Past Medical History:  Diagnosis Date  . Anemia, unspecified   . Hyperlipidemia, unspecified   . Hypertension   . Hypothyroidism, unspecified   . Pre-diabetes   . Vitamin B12 deficiency     Pertinent ocular history:  Hemorrhagic retinal detachment right eye  Pertinent family history:  Family History  Problem Relation Age of Onset  . Renal Disease Sister   . Cancer Sister     General:  Healthy appearing patient in no distress.     Eyes:    Acuity OD HM  External: Within normal limits      Anterior segment: Within normal limits        Fundus: no view - B-scan - vitreous hemorrhagel        Impression: Vitreous hemorrhage right eye  Plan: Vitrectomy with endolaser right eye   Carmela Rima, MD

## 2020-09-29 NOTE — Op Note (Signed)
Anna Bernard 09/29/2020 Diagnosis: vitreous hemorrhage right eye  Procedure: [place] Vitrectomy, Endolaser and endocuatery Operative Eye:  right eye  Surgeon: Harrold Donath Estimated Blood Loss: minimal Specimens for Pathology:  None Complications: none   The  patient was prepped and draped in the usual fashion for ocular surgery on the  right eye .  A lid speculum was placed.  Infusion line and trocar was placed at the 8 o'clock position approximately 3.5 mm from the surgical limbus.   The infusion line was allowed to run and then clamped when placed at the cannula opening. The line was inserted and secured to the drape with an adhesive strip.   Active trocars/cannula were placed at the 10 and 2 o'clock positions approximately 3.5 mm from the surgical limbus. The cannula was visualized in the vitreous cavity.  The light pipe and vitreous cutter were inserted into the vitreous cavity and the vitrectomy was used to remove the vitreous hemorrhage.  The silicone soft tipped cannula was used to carefully vacuum blood from the macula.  There was notable foveal thickening/edema.  There was also a large macroaneurysm along the superior arcade with associated hemorrhage.  Focal endolaser was applied to the macroaneurysm and endocautery was used to control any bleeding.   A partial air-fluid exchange was performed.  The superior cannulas were sequentially removed with concommitant tamponade using a cotton tipped applicator and noted to be air tight.  The infusion line and trocar were removed and the sclerotomy was noted to be air tight with normal intraocular pressure by digital palpapation.  Subconjunctival injections of  Dexamethasone 4mg /50ml was placed in the infero-medial quadrant.   The speculum and drapes were removed and the eye was patched with Polymixin/Bacitracin ophthalmic ointment. An eye shield was placed and the patient was transferred alert and conversant with stable vital  signs to the post operative recovery area.  The patient tolerated the procedure well and no complications were noted.  0m MD

## 2020-09-29 NOTE — Transfer of Care (Signed)
Immediate Anesthesia Transfer of Care Note  Patient: Anna Bernard  Procedure(s) Performed: PARS PLANA VITRECTOMY WITH 25 GAUGE - ENDOCAUTRY (Right Eye) PHOTOCOAGULATION WITH LASER (Right Eye)  Patient Location: PACU  Anesthesia Type:MAC  Level of Consciousness: awake, alert  and oriented  Airway & Oxygen Therapy: Patient Spontanous Breathing and Patient connected to nasal cannula oxygen  Post-op Assessment: Report given to RN and Post -op Vital signs reviewed and stable  Post vital signs: Reviewed and stable  Last Vitals:  Vitals Value Taken Time  BP 157/68 09/29/20 2034  Temp    Pulse 68 09/29/20 2035  Resp 14 09/29/20 2035  SpO2 100 % 09/29/20 2035  Vitals shown include unvalidated device data.  Last Pain:  Vitals:   09/29/20 1641  TempSrc:   PainSc: 2       Patients Stated Pain Goal: 3 (25/89/48 3475)  Complications: No complications documented.

## 2020-09-29 NOTE — Brief Op Note (Signed)
09/29/2020  8:18 PM  PATIENT:  Anna Bernard  70 y.o. female  PRE-OPERATIVE DIAGNOSIS:  vitreous hemorrhage, right eye  POST-OPERATIVE DIAGNOSIS:  Vitreous Hemorrhage right eye  PROCEDURE:  Procedure(s): PARS PLANA VITRECTOMY WITH 25 GAUGE (Right) PHOTOCOAGULATION WITH LASER (Right) ENDOCUATERY (Right) AIR/FLUID EXCHANGE (Right)  SURGEON:  Surgeon(s) and Role:    * Jalene Mullet, MD - Primary  PHYSICIAN ASSISTANT:   ASSISTANTS: none   ANESTHESIA:   local and MAC  EBL:  minimal   BLOOD ADMINISTERED:none  DRAINS: none   LOCAL MEDICATIONS USED:  MARCAINE    and LIDOCAINE   SPECIMEN:  No Specimen  DISPOSITION OF SPECIMEN:  N/A  COUNTS:  YES  TOURNIQUET:  * No tourniquets in log *  DICTATION: .Note written in EPIC  PLAN OF CARE: Discharge to home after PACU  PATIENT DISPOSITION:  PACU - hemodynamically stable.   Delay start of Pharmacological VTE agent (>24hrs) due to surgical blood loss or risk of bleeding: not applicable

## 2020-09-29 NOTE — Anesthesia Postprocedure Evaluation (Signed)
Anesthesia Post Note  Patient: Anna Bernard  Procedure(s) Performed: PARS PLANA VITRECTOMY WITH 25 GAUGE - ENDOCAUTRY (Right Eye) PHOTOCOAGULATION WITH LASER (Right Eye)     Patient location during evaluation: PACU Anesthesia Type: MAC Level of consciousness: awake and alert Pain management: pain level controlled Vital Signs Assessment: post-procedure vital signs reviewed and stable Respiratory status: spontaneous breathing, nonlabored ventilation, respiratory function stable and patient connected to nasal cannula oxygen Cardiovascular status: stable and blood pressure returned to baseline Postop Assessment: no apparent nausea or vomiting Anesthetic complications: no   No complications documented.  Last Vitals:  Vitals:   09/29/20 2049 09/29/20 2104  BP: (!) 147/49 (!) 144/76  Pulse: 72 88  Resp: 14 16  Temp:  (!) 36.4 C  SpO2: 99% 100%    Last Pain:  Vitals:   09/29/20 2104  TempSrc:   PainSc: 0-No pain                 Tiajuana Amass

## 2020-09-30 ENCOUNTER — Encounter (HOSPITAL_COMMUNITY): Payer: Self-pay | Admitting: Ophthalmology

## 2020-10-01 ENCOUNTER — Ambulatory Visit: Payer: Medicare Other | Admitting: Physical Therapy

## 2020-10-01 ENCOUNTER — Ambulatory Visit: Payer: Medicare Other | Admitting: Occupational Therapy

## 2020-10-06 ENCOUNTER — Ambulatory Visit: Payer: Medicare Other | Admitting: Occupational Therapy

## 2020-10-06 ENCOUNTER — Ambulatory Visit: Payer: Medicare Other | Admitting: Physical Therapy

## 2020-10-07 DIAGNOSIS — H4311 Vitreous hemorrhage, right eye: Secondary | ICD-10-CM | POA: Diagnosis not present

## 2020-10-07 NOTE — Progress Notes (Signed)
Virtual Visit Via Video   The purpose of this virtual visit is to provide medical care while limiting exposure to the novel coronavirus.    Consent was obtained for video visit:  Yes.   Answered questions that patient had about telehealth interaction:  Yes.   I discussed the limitations, risks, security and privacy concerns of performing an evaluation and management service by telemedicine. I also discussed with the patient that there may be a patient responsible charge related to this service. The patient expressed understanding and agreed to proceed.  Pt location: Home Physician Location: office Name of referring provider:  Lezlie Lye, Irma M, * I connected with Anna Bernard at patients initiation/request on 10/09/2020 at 11:15 AM EST by video enabled telemedicine application and verified that I am speaking with the correct person using two identifiers. Pt MRN:  [id number] Pt DOB:  December 14, 1950 Video Participants:  Anna Bernard; husband present and supplements history  Assessment/Plan:   1.  Parkinsons Disease  -Continue carbidopa/levodopa 25/100, 1 tablet 3 times per day.  We talked about whether or not to change the medication because she is having some dizziness, but it is only morning dizziness, so unlikely solely related to the medication.  Regardless, she does not want to change anything.  -Patient wants to hold on physical therapy until her eye issues have stabilized more.   2.  Hypertension  -On multiple antihypertensives.  She is watching her blood pressure at home and we will need to continue to do this.  She and I discussed that Parkinson's disease itself tends to lower blood pressure and we may need to lower some of the antihypertensives in the future, especially given dizziness.  3.  Retinal detachment  -Patient currently seeing ophthalmology and recently had 2 surgeries.   Subjective:   Anna Bernard was seen today in follow up for Parkinsons disease, which was  diagnosed last visit.  My previous records were reviewed prior to todays visit as well as outside records available to me.  She is not sure that the medicine has helped, but her husband states that he thinks it has.  He states that she no longer complains that she cannot pick up her feet from the floor when she walks.  He does note she still has some trouble getting in and out of the car.  Pt denies falls.  Pt denies lightheadedness, near syncope.  No hallucinations.  Mood has been good.  Therapy notes are reviewed since our last visit.  She has been following with ophthalmology and had surgical intervention for hemorrhagic retinal detachment.  Notes are reviewed.  She thinks that her vision is coming back slowly.  She had to stop physical therapy because of it.  Current prescribed movement disorder medications: Carbidopa/levodopa 25/100, 1 tablet 3 times per day (started last visit)   PREVIOUS MEDICATIONS: Sinemet  ALLERGIES:  No Known Allergies  CURRENT MEDICATIONS:  Outpatient Encounter Medications as of 10/09/2020  Medication Sig  . aspirin 81 MG tablet Take 81 mg by mouth daily.  . carbidopa-levodopa (SINEMET IR) 25-100 MG tablet Take 1 tablet by mouth 3 (three) times daily. 7am/11am/4pm  . [employer] B-12 500 MCG tablet TAKE 1 TABLET (500 MCG TOTAL) BY MOUTH DAILY. (Patient taking differently: Take 500 mcg by mouth daily.)  . docusate sodium (COLACE) 100 MG capsule Take 1 capsule (100 mg total) by mouth 2 (two) times daily as needed for mild constipation.  . ferrous sulfate 324 MG TBEC Take 1  tablet (324 mg total) by mouth daily with breakfast.  . furosemide (LASIX) 20 MG tablet TAKE 1 TABLET BY MOUTH TWICE A DAY (Patient taking differently: Take 40 mg by mouth 2 (two) times daily.)  . KLOR-CON M20 20 MEQ tablet TAKE 1 TABLET BY MOUTH TWICE A DAY (Patient taking differently: Take 20 mEq by mouth daily.)  . levothyroxine (SYNTHROID) 150 MCG tablet Take 1 tablet (150 mcg total) by mouth daily.  .  Multiple Vitamin (MULTIVITAMIN WITH MINERALS) TABS Take 1 tablet by mouth daily.  . naproxen sodium (ALEVE) 220 MG tablet Take 220 mg by mouth 2 (two) times daily as needed (pain).  . nystatin (MYCOSTATIN/NYSTOP) powder Apply 1 application topically 2 (two) times daily. (Patient taking differently: Apply 1 application topically every other day.)  . olmesartan (BENICAR) 20 MG tablet Take 1 tablet (20 mg total) by mouth daily.  . polyethylene glycol powder (GLYCOLAX/MIRALAX) 17 GM/SCOOP powder Take 17 g by mouth 2 (two) times daily as needed for mild constipation or moderate constipation.  . rosuvastatin (CRESTOR) 10 MG tablet Take 1 tablet (10 mg total) by mouth daily.  Marland Kitchen trimethoprim (TRIMPEX) 100 MG tablet Take 100 mg by mouth daily.    No facility-administered encounter medications on file as of 10/09/2020.    Objective:   PHYSICAL EXAMINATION:    VITALS:   Vitals:   10/09/20 1058  Weight: 180 lb (81.6 kg)  Height: 5\' 1"  (1.549 m)    GEN:  The patient appears stated age and is in NAD.  Neurological examination:  Orientation: The patient is alert and oriented x3. Cranial nerves: There is good facial symmetry. There is facial hypomimia.  The speech is fluent and clear. Soft palate rises symmetrically and there is no tongue deviation. Hearing is intact to conversational tone. Motor: Strength is at least antigravity x 4.   Shoulder shrug is equal and symmetric.  There is no pronator drift.  Movement examination: Tone: unable Abnormal movements: None seen, but hands not seen at rest Coordination:  There is decremation with RAM's, with hand opening and closing and finger taps on the right    I have reviewed and interpreted the following labs independently    Chemistry      Component Value Date/Time   NA 138 09/29/2020 1722   NA 142 03/30/2020 1217   K 4.6 09/29/2020 1722   CL 105 09/29/2020 1722   CO2 23 09/29/2020 1722   BUN 15 09/29/2020 1722   BUN 18 03/30/2020 1217    CREATININE 0.63 09/29/2020 1722   CREATININE 0.52 04/21/2013 1526   GLU 133 08/27/2012 0000      Component Value Date/Time   CALCIUM 8.8 (L) 09/29/2020 1722   ALKPHOS 69 08/15/2020 1000   AST 19 08/15/2020 1000   ALT 6 08/15/2020 1000   BILITOT 0.6 08/15/2020 1000   BILITOT 0.4 03/30/2020 1217       Lab Results  Component Value Date   WBC 5.3 09/29/2020   HGB 9.2 (L) 09/29/2020   HCT 28.8 (L) 09/29/2020   MCV 74.2 (L) 09/29/2020   PLT 315 09/29/2020    Lab Results  Component Value Date   TSH 1.390 09/10/2020   Follow up Instructions      -I discussed the assessment and treatment plan with the patient. The patient was provided an opportunity to ask questions and all were answered. The patient agreed with the plan and demonstrated an understanding of the instructions.   The patient was advised to  call back or seek an in-person evaluation if the symptoms worsen or if the condition fails to improve as anticipated.    Total time spent on today's visit was 25 minutes, including both face-to-face time and nonface-to-face time.  Time included that spent on review of records (prior notes available to me/labs/imaging if pertinent), discussing treatment and goals, answering patient's questions and coordinating care.   Kerin Salen, DO   Cc:  Just, Azalee Course, FNP

## 2020-10-08 ENCOUNTER — Ambulatory Visit: Payer: Medicare Other | Admitting: Physical Therapy

## 2020-10-08 ENCOUNTER — Encounter: Payer: Medicare Other | Admitting: Occupational Therapy

## 2020-10-09 ENCOUNTER — Other Ambulatory Visit: Payer: Self-pay

## 2020-10-09 ENCOUNTER — Telehealth (INDEPENDENT_AMBULATORY_CARE_PROVIDER_SITE_OTHER): Payer: Medicare Other | Admitting: Neurology

## 2020-10-09 VITALS — Ht 61.0 in | Wt 180.0 lb

## 2020-10-09 DIAGNOSIS — G20 Parkinson's disease: Secondary | ICD-10-CM

## 2020-10-13 ENCOUNTER — Ambulatory Visit: Payer: Medicare Other | Admitting: Physical Therapy

## 2020-10-13 ENCOUNTER — Encounter: Payer: Medicare Other | Admitting: Occupational Therapy

## 2020-10-15 ENCOUNTER — Ambulatory Visit: Payer: Medicare Other | Admitting: Occupational Therapy

## 2020-10-15 ENCOUNTER — Ambulatory Visit: Payer: Medicare Other | Admitting: Physical Therapy

## 2020-10-16 ENCOUNTER — Other Ambulatory Visit: Payer: Self-pay | Admitting: Neurology

## 2020-10-16 NOTE — Telephone Encounter (Signed)
Rx(s) sent to pharmacy electronically.  

## 2020-11-02 DIAGNOSIS — H34831 Tributary (branch) retinal vein occlusion, right eye, with macular edema: Secondary | ICD-10-CM | POA: Diagnosis not present

## 2020-11-02 DIAGNOSIS — H3561 Retinal hemorrhage, right eye: Secondary | ICD-10-CM | POA: Diagnosis not present

## 2020-11-02 DIAGNOSIS — H3581 Retinal edema: Secondary | ICD-10-CM | POA: Diagnosis not present

## 2020-11-02 DIAGNOSIS — H3509 Other intraretinal microvascular abnormalities: Secondary | ICD-10-CM | POA: Diagnosis not present

## 2020-11-02 DIAGNOSIS — H35032 Hypertensive retinopathy, left eye: Secondary | ICD-10-CM | POA: Diagnosis not present

## 2020-11-03 DIAGNOSIS — H34831 Tributary (branch) retinal vein occlusion, right eye, with macular edema: Secondary | ICD-10-CM | POA: Diagnosis not present

## 2020-11-30 DIAGNOSIS — H35721 Serous detachment of retinal pigment epithelium, right eye: Secondary | ICD-10-CM | POA: Diagnosis not present

## 2020-11-30 DIAGNOSIS — H25811 Combined forms of age-related cataract, right eye: Secondary | ICD-10-CM | POA: Diagnosis not present

## 2020-11-30 DIAGNOSIS — H35032 Hypertensive retinopathy, left eye: Secondary | ICD-10-CM | POA: Diagnosis not present

## 2020-11-30 DIAGNOSIS — H34831 Tributary (branch) retinal vein occlusion, right eye, with macular edema: Secondary | ICD-10-CM | POA: Diagnosis not present

## 2020-11-30 DIAGNOSIS — H3561 Retinal hemorrhage, right eye: Secondary | ICD-10-CM | POA: Diagnosis not present

## 2020-12-02 DIAGNOSIS — H34831 Tributary (branch) retinal vein occlusion, right eye, with macular edema: Secondary | ICD-10-CM | POA: Diagnosis not present

## 2020-12-10 ENCOUNTER — Ambulatory Visit (INDEPENDENT_AMBULATORY_CARE_PROVIDER_SITE_OTHER): Payer: Medicare Other | Admitting: Family Medicine

## 2020-12-10 ENCOUNTER — Encounter: Payer: Self-pay | Admitting: Family Medicine

## 2020-12-10 ENCOUNTER — Other Ambulatory Visit: Payer: Self-pay

## 2020-12-10 VITALS — BP 129/76 | HR 85 | Temp 98.4°F | Ht 61.0 in | Wt 175.0 lb

## 2020-12-10 DIAGNOSIS — E079 Disorder of thyroid, unspecified: Secondary | ICD-10-CM | POA: Diagnosis not present

## 2020-12-10 DIAGNOSIS — E039 Hypothyroidism, unspecified: Secondary | ICD-10-CM | POA: Diagnosis not present

## 2020-12-10 DIAGNOSIS — I10 Essential (primary) hypertension: Secondary | ICD-10-CM | POA: Diagnosis not present

## 2020-12-10 DIAGNOSIS — E782 Mixed hyperlipidemia: Secondary | ICD-10-CM | POA: Diagnosis not present

## 2020-12-10 DIAGNOSIS — D509 Iron deficiency anemia, unspecified: Secondary | ICD-10-CM | POA: Diagnosis not present

## 2020-12-10 DIAGNOSIS — E7800 Pure hypercholesterolemia, unspecified: Secondary | ICD-10-CM | POA: Diagnosis not present

## 2020-12-10 DIAGNOSIS — E119 Type 2 diabetes mellitus without complications: Secondary | ICD-10-CM | POA: Diagnosis not present

## 2020-12-10 MED ORDER — OLMESARTAN MEDOXOMIL 20 MG PO TABS: 20.0000 mg | ORAL_TABLET | Freq: Every day | ORAL | 3 refills | Status: DC

## 2020-12-10 MED ORDER — ROSUVASTATIN CALCIUM 10 MG PO TABS: 10.0000 mg | ORAL_TABLET | Freq: Every day | ORAL | 1 refills | Status: DC

## 2020-12-10 MED ORDER — LEVOTHYROXINE SODIUM 150 MCG PO TABS
150.0000 ug | ORAL_TABLET | Freq: Every day | ORAL | 1 refills | Status: DC
Start: 2020-12-10 — End: 2022-03-03

## 2020-12-10 NOTE — Patient Instructions (Addendum)
Ferrous sulfate 345m total 621miron    [employer] medicine (25th ed., pp. 103500-9381 [place]: Elsevier.">  Anemia  Anemia is a condition in which there is not enough red blood cells or hemoglobin in the blood. Hemoglobin is a substance in red blood cells that carries oxygen. When you do not have enough red blood cells or hemoglobin (are anemic), your body cannot get enough oxygen and your organs may not work properly. As a result, you may feel very tired or have other problems. What are the causes? Common causes of anemia include:  Excessive bleeding. Anemia can be caused by excessive bleeding inside or outside the body, including bleeding from the intestines or from heavy menstrual periods in females.  Poor nutrition.  Long-lasting (chronic) kidney, thyroid, and liver disease.  Bone marrow disorders, spleen problems, and blood disorders.  Cancer and treatments for cancer.  HIV (human immunodeficiency virus) and AIDS (acquired immunodeficiency syndrome).  Infections, medicines, and autoimmune disorders that destroy red blood cells. What are the signs or symptoms? Symptoms of this condition include:  Minor weakness.  Dizziness.  Headache, or difficulties concentrating and sleeping.  Heartbeats that feel irregular or faster than normal (palpitations).  Shortness of breath, especially with exercise.  Pale skin, lips, and nails, or cold hands and feet.  Indigestion and nausea. Symptoms may occur suddenly or develop slowly. If your anemia is mild, you may not have symptoms. How is this diagnosed? This condition is diagnosed based on blood tests, your medical history, and a physical exam. In some cases, a test may be needed in which cells are removed from the soft tissue inside of a bone and looked at under a microscope (bone marrow biopsy). Your health care provider may also check your stool (feces) for blood and may do additional testing to look for the cause  of your bleeding. Other tests may include:  Imaging tests, such as a CT scan or MRI.  A procedure to see inside your esophagus and stomach (endoscopy).  A procedure to see inside your colon and rectum (colonoscopy). How is this treated? Treatment for this condition depends on the cause. If you continue to lose a lot of blood, you may need to be treated at a hospital. Treatment may include:  Taking supplements of iron, vitamin B1W29or folic acid.  Taking a hormone medicine (erythropoietin) that can help to stimulate red blood cell growth.  Having a blood transfusion. This may be needed if you lose a lot of blood.  Making changes to your diet.  Having surgery to remove your spleen. Follow these instructions at home:  Take over-the-counter and prescription medicines only as told by your health care provider.  Take supplements only as told by your health care provider.  Follow any diet instructions that you were given by your health care provider.  Keep all follow-up visits as told by your health care provider. This is important. Contact a health care provider if:  You develop new bleeding anywhere in the body. Get help right away if:  You are very weak.  You are short of breath.  You have pain in your abdomen or chest.  You are dizzy or feel faint.  You have trouble concentrating.  You have bloody stools, black stools, or tarry stools.  You vomit repeatedly or you vomit up blood. These symptoms may represent a serious problem that is an emergency. Do not wait to see if the symptoms will go away. Get medical help right away. Call your  local emergency services (911 in the [place] Do not drive yourself to the hospital. Summary  Anemia is a condition in which you do not have enough red blood cells or enough of a substance in your red blood cells that carries oxygen (hemoglobin).  Symptoms may occur suddenly or develop slowly.  If your anemia is mild, you may not have  symptoms.  This condition is diagnosed with blood tests, a medical history, and a physical exam. Other tests may be needed.  Treatment for this condition depends on the cause of the anemia. This information is not intended to replace advice given to you by your health care provider. Make sure you discuss any questions you have with your health care provider. Document Revised: 08/13/2019 Document Reviewed: 08/13/2019 Elsevier Patient Education  2021 [employer].  If you have lab work done today you will be contacted with your lab results within the next 2 weeks.  If you have not heard from [place] then please contact us. The fastest way to get your results is to register for My Chart.   IF you received an x-ray today, you will receive an invoice from [facility]. Please contact [facility] at [phone] with questions or concerns regarding your invoice.   IF you received labwork today, you will receive an invoice from [facility]. Please contact [facility] at [phone] with questions or concerns regarding your invoice.   Our billing staff will not be able to assist you with questions regarding bills from these companies.  You will be contacted with the lab results as soon as they are available. The fastest way to get your results is to activate your My Chart account. Instructions are located on the last page of this paperwork. If you have not heard from [place] regarding the results in 2 weeks, please contact this office.

## 2020-12-10 NOTE — Progress Notes (Signed)
3/24/202210:59 AM  Anna Bernard [birth year], 70 y.o., female [id number]  Chief Complaint  Patient presents with  . 3 month follow up     States feeling weak and would like to know if her medications are causing this    HPI:   Patient is a 70 y.o. female with past medical history significant for parkisons, dizziness,HTN, HLD,diabetes,microcystic anemia,vitamin B12 deficiencyandhypothyroidismwho presents today forroutine follow-up.  They did not know the clinic was closing Encouraged to call to find a new PCP  Has been feeling more weak as time goes on Diet is ok Has been anemic    Lab Results  Component Value Date   WBC 5.3 09/29/2020   HGB 9.2 (L) 09/29/2020   HCT 28.8 (L) 09/29/2020   MCV 74.2 (L) 09/29/2020   PLT 315 09/29/2020    Miralax and colace for bowel regimen  Hypothyroidism Levothyroxine daily 150 mcg Lab Results  Component Value Date   TSH 1.390 09/10/2020   Hyperlipidemia Crestor 10mg  daily Lab Results  Component Value Date   CHOL 146 03/30/2020   HDL 58 03/30/2020   LDLCALC 75 03/30/2020   TRIG 61 03/30/2020   CHOLHDL 2.5 03/30/2020   HTN Furosemide 20mg  bid Kcl 20 meq (last Potassium 09/29/20: 4.6) Benicar 20mg  daily Takes BP at home BP Readings from Last 3 Encounters:  12/10/20 129/76  09/29/20 (!) 144/76  09/10/20 (!) 142/78   Parkinson's disease (HCC)  Managed by neuro: carbidopa-levodopa 25-100  Chronic venous insufficiency Daily use of compression stockings  Screenings Mammogram: 08/09/19 [facility] 1 (ordred) Colonoscopy: 03/21/18, due 2029 Smoker: Quit 40 years ago  Health Maintenance  Topic Date Due  . OPHTHALMOLOGY EXAM  12/03/2020  . Hepatitis C Screening  09/10/2021 (Originally 04/29/1951)  . HEMOGLOBIN A1C  03/11/2021  . FOOT EXAM  06/17/2021  . MAMMOGRAM  08/08/2021  . COLONOSCOPY (Pts 45-48yrs Insurance coverage will need to be confirmed)  03/21/2028  . TETANUS/TDAP  06/26/2029  . INFLUENZA VACCINE  Completed   . DEXA SCAN  Completed  . COVID-19 Vaccine  Completed  . PNA vac Low Risk Adult  Completed  . HPV VACCINES  Aged Out     Depression screen [facility] 2/9 12/10/2020 09/10/2020 06/18/2020  Decreased Interest 0 0 0  Down, Depressed, Hopeless 0 0 0  PHQ - 2 Score 0 0 0    Fall Risk  12/10/2020 10/09/2020 09/10/2020 06/18/2020 04/23/2020  Falls in the past year? 0 0 0 0 0  Number falls in past yr: 0 0 0 0 0  Injury with Fall? 0 0 0 0 0  Risk for fall due to : - - No Fall Risks - -  Follow up Falls evaluation completed - Falls evaluation completed Falls evaluation completed -     No Known Allergies  Prior to Admission medications   Medication Sig Start Date End Date Taking? Authorizing Provider  aspirin 81 MG tablet Take 81 mg by mouth daily.   Yes [provider]  carbidopa-levodopa (SINEMET IR) 25-100 MG tablet Take 1 tablet by mouth 3 (three) times daily. 7am/11am/4pm 04/23/20  Yes Tat, 06/20/2020, DO  [facility] B-12 500 MCG tablet TAKE 1 TABLET (500 MCG TOTAL) BY MOUTH DAILY. Patient taking differently: Take 500 mcg by mouth daily. 04/30/18  Yes 06/23/20, Octaviano Batty, MD  furosemide (LASIX) 20 MG tablet TAKE 1 TABLET BY MOUTH TWICE A DAY 03/14/20  Yes Lezlie Lye, Meda Coffee, MD  KLOR-CON M20 20 MEQ tablet TAKE 1 TABLET BY  MOUTH TWICE A DAY Patient taking differently: Take 20 mEq by mouth. 03/14/20  Yes Lezlie Lye, Meda Coffee, MD  levothyroxine (SYNTHROID) 150 MCG tablet Take 1 tablet (150 mcg total) by mouth daily. 04/16/20  Yes Lezlie Lye, Meda Coffee, MD  Multiple Vitamin (MULTIVITAMIN WITH MINERALS) TABS Take 1 tablet by mouth daily.   Yes [provider]  naproxen sodium (ALEVE) 220 MG tablet Take 220 mg by mouth 2 (two) times daily as needed (pain).   Yes [provider]  nystatin (MYCOSTATIN/NYSTOP) powder Apply 1 application topically 2 (two) times daily. Patient taking differently: Apply 1 application topically daily as needed. 04/17/20  Yes Lezlie Lye, Meda Coffee, MD   olmesartan (BENICAR) 5 MG tablet Take 2 tablets (10 mg total) by mouth daily. 06/18/20  Yes Lezlie Lye, Meda Coffee, MD  rosuvastatin (CRESTOR) 10 MG tablet Take 1 tablet (10 mg total) by mouth daily. 06/18/20  Yes Lezlie Lye, Meda Coffee, MD  trimethoprim (TRIMPEX) 100 MG tablet Take 100 mg by mouth daily.  10/04/18  Yes [provider]    Past Medical History:  Diagnosis Date  . Anemia, unspecified   . Hyperlipidemia, unspecified   . Hypertension   . Hypothyroidism, unspecified   . Pre-diabetes   . Vitamin B12 deficiency     Past Surgical History:  Procedure Laterality Date  . ABDOMINAL HYSTERECTOMY    . GIVENS CAPSULE STUDY N/A 04/16/2018   Procedure: GIVENS CAPSULE STUDY;  Surgeon: Jeani Hawking, MD;  Location: [facility] ENDOSCOPY;  Service: Endoscopy;  Laterality: N/A;  . PARS PLANA VITRECTOMY Right 08/16/2020   Procedure: 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;  Surgeon: Carmela Rima, MD;  Location: [facility] OR;  Service: Ophthalmology;  Laterality: Right;  . PARS PLANA VITRECTOMY Right 09/29/2020   Procedure: PARS PLANA VITRECTOMY WITH 25 GAUGE - ENDOCAUTRY;  Surgeon: Carmela Rima, MD;  Location: [facility] OR;  Service: Ophthalmology;  Laterality: Right;  . PHOTOCOAGULATION WITH LASER Right 08/16/2020   Procedure: PHOTOCOAGULATION WITH LASER;  Surgeon: Carmela Rima, MD;  Location: [facility] OR;  Service: Ophthalmology;  Laterality: Right;  . PHOTOCOAGULATION WITH LASER Right 09/29/2020   Procedure: PHOTOCOAGULATION WITH LASER;  Surgeon: Carmela Rima, MD;  Location: [facility] OR;  Service: Ophthalmology;  Laterality: Right;  . SHOULDER SURGERY    . TONSILLECTOMY    . TUBAL LIGATION      Social History   Tobacco Use  . Smoking status: Former Games developer  . Smokeless tobacco: Never Used  . Tobacco comment: quit 30+ years ago  Substance Use Topics  . Alcohol use: No    Alcohol/week: 0.0 standard drinks    Family History  Problem Relation Age of Onset  . Renal  Disease Sister   . Cancer Sister     Review of Systems  Constitutional: Positive for malaise/fatigue. Negative for chills and fever.  Eyes: Positive for blurred vision. Negative for double vision.  Respiratory: Positive for shortness of breath (with activity). Negative for cough and wheezing.   Cardiovascular: Negative for chest pain, palpitations and leg swelling.  Gastrointestinal: Positive for constipation. Negative for abdominal pain, blood in stool, diarrhea, heartburn, nausea and vomiting.  Genitourinary: Positive for frequency. Negative for dysuria, hematuria and urgency.       Incontinence episodes, wears depends and is on furosemide  Musculoskeletal: Negative for back pain and joint pain.  Skin: Negative for rash.  Neurological: Positive for weakness. Negative for dizziness and headaches.  Psychiatric/Behavioral: The patient has insomnia.  OBJECTIVE:  Today's Vitals   12/10/20 1035  BP: 129/76  Pulse: 85  Temp: 98.4 F (36.9 C)  SpO2: 96%  Weight: 175 lb (79.4 kg)  Height: 5\' 1"  (1.549 m)   Body mass index is 33.07 kg/m.   Physical Exam Constitutional:      General: She is not in acute distress.    Appearance: Normal appearance. She is not ill-appearing.     Comments: Fatigued  HENT:     Head: Normocephalic.  Cardiovascular:     Rate and Rhythm: Normal rate and regular rhythm.     Pulses: Normal pulses.     Heart sounds: Normal heart sounds. No murmur heard. No friction rub. No gallop.   Pulmonary:     Effort: Pulmonary effort is normal. No respiratory distress.     Breath sounds: Normal breath sounds. No stridor. No wheezing, rhonchi or rales.  Abdominal:     General: Bowel sounds are normal.     Palpations: Abdomen is soft.     Tenderness: There is no abdominal tenderness.  Musculoskeletal:     Right lower leg: Edema (Right foot swelling) present.     Left lower leg: No edema.  Skin:    General: Skin is warm and dry.  Neurological:     Mental  Status: She is alert and oriented to person, place, and time.  Psychiatric:        Mood and Affect: Mood normal.        Behavior: Behavior normal.     No results found for this or any previous visit (from the past 24 hour(s)).  No results found.   ASSESSMENT and PLAN  Problem List Items Addressed This Visit      Cardiovascular and Mediastinum   Essential hypertension   Relevant Medications   rosuvastatin (CRESTOR) 10 MG tablet   olmesartan (BENICAR) 20 MG tablet     Endocrine   Thyroid disease   Relevant Medications   levothyroxine (SYNTHROID) 150 MCG tablet   Diabetes mellitus type 2, controlled, without complications (HCC)   Relevant Medications   rosuvastatin (CRESTOR) 10 MG tablet   olmesartan (BENICAR) 20 MG tablet     Other   Pure hypercholesterolemia   Relevant Medications   rosuvastatin (CRESTOR) 10 MG tablet   olmesartan (BENICAR) 20 MG tablet   Microcytic anemia - Primary    Other Visit Diagnoses    Hypothyroidism, unspecified type       Relevant Medications   levothyroxine (SYNTHROID) 150 MCG tablet   Hyperlipidemia       Relevant Medications   rosuvastatin (CRESTOR) 10 MG tablet   olmesartan (BENICAR) 20 MG tablet   Primary hypertension       Relevant Medications   rosuvastatin (CRESTOR) 10 MG tablet   olmesartan (BENICAR) 20 MG tablet          Return in about 3 months (around 03/12/2021).   03/14/2021 Breunna Nordmann, FNP-BC Primary Care at [facility] [address] [place], [place] Ph.  [phone] Fax [phone]

## 2020-12-16 ENCOUNTER — Encounter: Payer: Self-pay | Admitting: Podiatry

## 2020-12-16 ENCOUNTER — Ambulatory Visit: Payer: Medicare Other | Admitting: Podiatry

## 2020-12-16 ENCOUNTER — Other Ambulatory Visit: Payer: Self-pay

## 2020-12-16 DIAGNOSIS — N289 Disorder of kidney and ureter, unspecified: Secondary | ICD-10-CM | POA: Diagnosis not present

## 2020-12-16 DIAGNOSIS — B351 Tinea unguium: Secondary | ICD-10-CM

## 2020-12-16 DIAGNOSIS — M79675 Pain in left toe(s): Secondary | ICD-10-CM | POA: Diagnosis not present

## 2020-12-16 DIAGNOSIS — E119 Type 2 diabetes mellitus without complications: Secondary | ICD-10-CM

## 2020-12-16 DIAGNOSIS — I872 Venous insufficiency (chronic) (peripheral): Secondary | ICD-10-CM

## 2020-12-16 DIAGNOSIS — M79674 Pain in right toe(s): Secondary | ICD-10-CM | POA: Diagnosis not present

## 2020-12-16 NOTE — Progress Notes (Signed)
This patient returns to my office for at risk foot care.  This patient requires this care by a professional since this patient will be at risk due to having diabetes, chronic kidney disease.   This patient is unable to cut nails herself since the patient cannot reach her nails.These nails are painful walking and wearing shoes.  This patient presents for at risk foot care today.  General Appearance  Alert, conversant and in no acute stress.  Vascular  Dorsalis pedis pulses are palpable  bilaterally. Posterior tibial pulses are absent. Capillary return is within normal limits  bilaterally. Cold feet bilaterally. Absent digital hair.  Neurologic  Senn-Weinstein monofilament wire test within normal limits left foot.  LOPS right absent.. Muscle power within normal limits bilaterally.  Nails Thick disfigured discolored nails with subungual debris  from hallux to fifth toes bilaterally. No evidence of bacterial infection or drainage bilaterally.  Orthopedic  No limitations of motion  feet .  No crepitus or effusions noted.  No bony pathology or digital deformities noted. PTTD right foot.  DJD right reardoot medially.  Skin  normotropic skin with no porokeratosis noted bilaterally.  No signs of infections or ulcers noted.     Onychomycosis  Pain in right toes  Pain in left toes  Consent was obtained for treatment procedures.   Mechanical debridement of nails 1-5  bilaterally performed with a nail nipper.  Filed with dremel without incident. Patient requests a surgical consult for second toenail removal right foot.   Return office visit  3 months                    Told patient to return for periodic foot care and evaluation due to potential at risk complications.   Helane Gunther DPM

## 2020-12-21 ENCOUNTER — Encounter: Payer: Self-pay | Admitting: Occupational Therapy

## 2020-12-21 NOTE — Therapy (Signed)
[facility] [address] [place], [place], [address] Phone: [phone]   Fax:  [phone]  Patient Details  Name: Anna Bernard MRN: [id number] Date of Birth: 04/30/1951 Referring Provider:  No ref. provider found  Encounter Date: 12/21/2020  OCCUPATIONAL THERAPY DISCHARGE SUMMARY  Visits from Start of Care: 14  Current functional level related to goals / functional outcomes:  OT Short Term Goals - 09/09/20 1229      OT SHORT TERM GOAL #1   Title Pt will be independent with PD-specific HEP--check STGs 09/11/20    Time 4    Period Weeks    Status Not fully met   09/01/20:  needs cueing/reinforcement     OT SHORT TERM GOAL #2   Title Pt will demo at least 110* R shoulder flex with -40* elbow ext for functional reach.    Baseline 100* with -45* elbow ext    Time 4    Period Weeks    Status Achieved   09/01/20:  110* with -30* elbow ext     OT SHORT TERM GOAL #3   Title Pt will improve R hand coordination for ADLs as shown by improving time on 9-hole peg test by at least 6sec.    Baseline 56.84sec    Time 4    Period Weeks    Status Not met   09/08/20:  61sec     OT SHORT TERM GOAL #4   Title Pt willdemonstrate improved ease with dressing as evidenced by  performing  PPT#4 donning/ doffing jacket in 1 min 15 secs    Baseline 1 min 29 secs    Time 4    Period Weeks    Status Not met   09/08/20:  >35mn     OT SHORT TERM GOAL #5   Title Pt will verbalize understanding of appropriate community resources and ways to decr risk of future complications.    Time 4    Period Weeks    Status    09/08/20:  not fully met (began education regarding ways to decr future complications), continue goal          OT Long Term Goals - 09/23/20 1100      OT LONG TERM GOAL #1   Title Pt will verbalize understanding of adaptive strategies to incr ease, safety, and independence with ADLs and IADLs.--check LTGs 10/11/20    Time 8     Period Weeks    Status Not fully met--not completed     OT LONG TERM GOAL #2   Title Pt will demo at least 115* R shoulder flex with -35* elbow ext for functional reach.    Baseline 100* with -45* elbow ext    Time 8    Period Weeks    Status Not met.   90, -50     OT LONG TERM GOAL #3   Title Pt will perform UB dressing consistently mod I.    Time 8    Period Weeks    Status Not met.   minA, not consistently performing mod I     OT LONG TERM GOAL #4   Title Pt will perform LB dressing consistently mod I.    Time 8    Period Weeks    Status Not met   mod A     OT LONG TERM GOAL #5   Title Pt will improve R hand coordination for ADLs as shown by improving time on 9-hole peg test  by at least 10sec.    Baseline 56.84sec    Time 8    Period Weeks    Status Not met/unable to reassess     OT LONG TERM GOAL #6   Title Pt will improved RUE functional reaching and coordination for ADLs as shown by improving score on box and blocks test by at least 5 blocks.    Baseline R-31 blocks, L-40 blocks    Time 8    Period Weeks    Status Not met   31 blocks on 09/23/20         **All goals not met/not re-assessed due to pt not returning to OT due to other medical concerns   Remaining deficits: Bradykinesia, rigidity, decr coordination, abnormal posture, decr balance/functional mobility   Education / Equipment: Began education for the following:  PD-specific HEP, adaptive strategies for ADLs/IADLs, ways to prevent future complications, community resources.  However, unable to fully complete education due to pt not returning unexpectedly  Plan: Patient agrees to discharge.  Patient goals were not met. Patient is being discharged due to not returning to occupational therapy. Pt was seeing ophthalmology and had surgical intervention for hemorrhagic retinal detachment.  Pt did not want to continue with therapy until eye issues stabilize.  Pt would benefit from occupational therapy  evaluation when eye condition stabilizes to assess for need for further therapy/functional changes due to progressive nature of diagnosis.     [facility] 12/21/2020, 2:55 PM  [facility] [address] [place], [place], [address] Phone: [phone]   Fax:  [place], OTR/L [facility] [address] [place], [place]  [phone] phone [phone] 12/21/20 3:04 PM

## 2020-12-28 ENCOUNTER — Other Ambulatory Visit: Payer: Self-pay

## 2020-12-28 ENCOUNTER — Ambulatory Visit: Payer: Medicare Other | Admitting: Podiatry

## 2020-12-28 DIAGNOSIS — Q666 Other congenital valgus deformities of feet: Secondary | ICD-10-CM | POA: Diagnosis not present

## 2020-12-28 DIAGNOSIS — L602 Onychogryphosis: Secondary | ICD-10-CM

## 2020-12-28 DIAGNOSIS — M21071 Valgus deformity, not elsewhere classified, right ankle: Secondary | ICD-10-CM | POA: Diagnosis not present

## 2020-12-28 DIAGNOSIS — M76821 Posterior tibial tendinitis, right leg: Secondary | ICD-10-CM

## 2020-12-28 DIAGNOSIS — M2041 Other hammer toe(s) (acquired), right foot: Secondary | ICD-10-CM | POA: Diagnosis not present

## 2020-12-28 NOTE — Patient Instructions (Signed)

## 2020-12-28 NOTE — Progress Notes (Signed)
  Subjective:  Patient ID: Anna Bernard, female    DOB: February 26, 1951,  MRN: [id number]  Chief Complaint  Patient presents with  . Nail Problem    Consult for total nail removal, right second toe.    70 y.o. female presents with the above complaint. History confirmed with patient.  Referred to me by Dr. Stacie Acres for permanent total nail avulsion of the right second toe, her son is with her today.  The toenail has been very painful.  Has never really grown normally.  She also has collapsing arches with pain along the anterior of the ankle  Objective:  Physical Exam: warm, good capillary refill, no trophic changes or ulcerative lesions, normal DP and PT pulses and normal sensory exam.  Right second toe gryphotic thickened and mycotic with pain on palpation.  She has hammertoe deformity of the right foot.  She has ankle valgus and severe pes planus Assessment:   1. Onychogryphosis   2. Pes valgus of right foot   3. Posterior tibial tendon dysfunction (PTTD) of right lower extremity   4. Acquired valgus deformity of right ankle      Plan:  Patient was evaluated and treated and all questions answered.  Reviewed treatment options for the severe pes planus deformity and ankle valgus on the right side.  I think at her age and medical comorbidities that surgical invention would be extensive and risky.  I recommend nonsurgical treatment with bracing.  A prescription was written for [facility] for an [place] brace     Dystrophic Nail, right -Patient elects to proceed with minor surgery to remove dystrophic toenail today. Consent reviewed and signed by patient. -Dystrophic nail excised. See procedure note. -Educated on post-procedure care including soaking. Written instructions provided and reviewed. -Patient to follow up in 2 weeks for nail check.  Procedure: Excision of dystrophic toenail Location: Right 2nd toe  nail  Anesthesia: Lidocaine 1% plain; 1.5 mL and Marcaine 0.5% plain; 1.5 mL,  digital block. Skin Prep: Betadine. Dressing: Silvadene; telfa; dry, sterile, compression dressing. Technique: Following skin prep, the toe was exsanguinated and a tourniquet was secured at the base of the toe. The affected nail was freed, and excised. Chemical matrixectomy was then performed with phenol and irrigated out with alcohol. The tourniquet was then removed and sterile dressing applied. Disposition: Patient tolerated procedure well. Patient to return in 2 weeks for follow-up.     Return in about 2 weeks (around 01/11/2021) for nail re-check.

## 2020-12-29 ENCOUNTER — Encounter: Payer: Self-pay | Admitting: Podiatry

## 2020-12-31 NOTE — Telephone Encounter (Signed)
Opened in error

## 2021-01-11 ENCOUNTER — Ambulatory Visit: Payer: Medicare Other | Admitting: Podiatry

## 2021-01-11 ENCOUNTER — Encounter: Payer: Self-pay | Admitting: Podiatry

## 2021-01-11 ENCOUNTER — Other Ambulatory Visit: Payer: Self-pay

## 2021-01-11 DIAGNOSIS — L602 Onychogryphosis: Secondary | ICD-10-CM

## 2021-01-11 DIAGNOSIS — M21071 Valgus deformity, not elsewhere classified, right ankle: Secondary | ICD-10-CM | POA: Diagnosis not present

## 2021-01-11 DIAGNOSIS — M76821 Posterior tibial tendinitis, right leg: Secondary | ICD-10-CM | POA: Diagnosis not present

## 2021-01-11 DIAGNOSIS — Q666 Other congenital valgus deformities of feet: Secondary | ICD-10-CM | POA: Diagnosis not present

## 2021-01-11 NOTE — Progress Notes (Signed)
  Subjective:  Patient ID: Anna Bernard, female    DOB: [birth year],  MRN: [id number]  Chief Complaint  Patient presents with  . Nail Problem     2 weeks (around 01/11/2021) for nail re-check    70 y.o. female returns with the above complaint. History confirmed with patient.  Doing well she is in the process of having the brace fitted Objective:  Physical Exam: warm, good capillary refill, no trophic changes or ulcerative lesions, normal DP and PT pulses and normal sensory exam.  Right second toe avulsion matricectomy site is healing well.  She has hammertoe deformity of the right foot.  She has ankle valgus and severe pes planus Assessment:   1. Onychogryphosis   2. Pes valgus of right foot   3. Posterior tibial tendon dysfunction (PTTD) of right lower extremity   4. Acquired valgus deformity of right ankle      Plan:  Patient was evaluated and treated and all questions answered.  [place] brace for her deformity being managed by [facility] now     Dystrophic Nail, right -Leave open to air and allow to scab up and heal over return if needed resume normal activities    Return if symptoms worsen or fail to improve.

## 2021-01-13 DIAGNOSIS — H34831 Tributary (branch) retinal vein occlusion, right eye, with macular edema: Secondary | ICD-10-CM | POA: Diagnosis not present

## 2021-02-16 DIAGNOSIS — M25371 Other instability, right ankle: Secondary | ICD-10-CM | POA: Diagnosis not present

## 2021-03-11 NOTE — Progress Notes (Signed)
Assessment/Plan:   1.  Parkinsons Disease  -d/c carbidopa/levodopa 25/100 IR  -start carbidopa/levodopa CR, 2 at 7am, 2 at 11 am and 1 at 4pm.  Told pt/husband if that didn't work could trial 1 tab 5 times per day  -restart PT.  Order sent  -met with LCSW today.  Discussed support.  May need antidepressant if above not helpful.  Hard to tell if depressed b/c immobile or mood plays primary role  2.  Hypertension  -Monitoring as is on multiple antihypertensives.  3.  Urinary frequency  -on lasix.  Will need to f/u prescribing physician   Subjective:   Anna Bernard was seen today in follow up for Parkinsons disease.  My previous records were reviewed prior to todays visit as well as outside records available to me. Husband accompanies the history.  Feels weak all of the time.  Voice per husband.  "All I can tell is that I feel bad."  States that she thinks that this medication makes her feel bad.  When asked what feels bad, she has trouble stating what feels bad but sounds like she is tired and "draggy."  Pt denies falls.  Pt denies lightheadedness, near syncope.  No hallucinations.  Mood has been bad.  C/o urination frequently.    Current prescribed movement disorder medications: Carbidopa/levodopa 25/100, 1 tablet 3 times per day   PREVIOUS MEDICATIONS: Sinemet  ALLERGIES:  No Known Allergies  CURRENT MEDICATIONS:  Outpatient Encounter Medications as of 03/12/2021  Medication Sig   aspirin 81 MG tablet Take 81 mg by mouth daily.   carbidopa-levodopa (SINEMET IR) 25-100 MG tablet TAKE 1 TABLET BY MOUTH 3 TIMES DAILY. AT 7AM/11AM/4PM   docusate sodium (COLACE) 100 MG capsule Take 1 capsule (100 mg total) by mouth 2 (two) times daily as needed for mild constipation.   furosemide (LASIX) 20 MG tablet TAKE 1 TABLET BY MOUTH TWICE A DAY (Patient taking differently: Take 40 mg by mouth 2 (two) times daily.)   KLOR-CON M20 20 MEQ tablet TAKE 1 TABLET BY MOUTH TWICE A DAY (Patient  taking differently: Take 20 mEq by mouth daily.)   levothyroxine (SYNTHROID) 150 MCG tablet Take 1 tablet (150 mcg total) by mouth daily.   Multiple Vitamin (MULTIVITAMIN WITH MINERALS) TABS Take 1 tablet by mouth daily.   naproxen sodium (ALEVE) 220 MG tablet Take 220 mg by mouth 2 (two) times daily as needed (pain).   nystatin (MYCOSTATIN/NYSTOP) powder Apply 1 application topically 2 (two) times daily. (Patient taking differently: Apply 1 application topically every other day.)   ofloxacin (OCUFLOX) 0.3 % ophthalmic solution Place 1 drop into the right eye 4 (four) times daily.   olmesartan (BENICAR) 20 MG tablet Take 1 tablet (20 mg total) by mouth daily.   polyethylene glycol powder (GLYCOLAX/MIRALAX) 17 GM/SCOOP powder Take 17 g by mouth 2 (two) times daily as needed for mild constipation or moderate constipation.   prednisoLONE acetate (PRED FORTE) 1 % ophthalmic suspension SMARTSIG:In Eye(s)   rosuvastatin (CRESTOR) 10 MG tablet Take 1 tablet (10 mg total) by mouth daily.   trimethoprim (TRIMPEX) 100 MG tablet Take 100 mg by mouth daily.    No facility-administered encounter medications on file as of 03/12/2021.    Objective:   PHYSICAL EXAMINATION:    VITALS:   Vitals:   03/12/21 1311  BP: 134/70  Pulse: 84  SpO2: 93%  Weight: 167 lb (75.8 kg)  Height: 5' 1"  (1.549 m)    GEN:  The  patient appears stated age and is in NAD. HEENT:  Normocephalic, atraumatic.  The mucous membranes are moist. The superficial temporal arteries are without ropiness or tenderness. CV:  RRR Lungs:  CTAB Neck/HEME:  There are no carotid bruits bilaterally.  Neurological examination:  Orientation: The patient is alert and oriented x3. Cranial nerves: There is good facial symmetry with facial hypomimia. The speech is fluent and clear. Soft palate rises symmetrically and there is no tongue deviation. Hearing is intact to conversational tone. Sensation: Sensation is intact to light touch  throughout Motor: Strength is at least antigravity x4.  Movement examination: Tone: There is nl tone in the ue/le Abnormal movements: none Coordination:  There is decremation with RAM's, with any form of RAMS, including alternating supination and pronation of the forearm, hand opening and closing, finger taps, heel taps and toe taps, R>>L Gait and Station: The patient has difficulty arising out of a deep-seated chair without the use of the hands. The patient's stride length is sig decreased and drags the R leg.    I have reviewed and interpreted the following labs independently    Chemistry      Component Value Date/Time   NA 138 09/29/2020 1722   NA 142 03/30/2020 1217   K 4.6 09/29/2020 1722   CL 105 09/29/2020 1722   CO2 23 09/29/2020 1722   BUN 15 09/29/2020 1722   BUN 18 03/30/2020 1217   CREATININE 0.63 09/29/2020 1722   CREATININE 0.52 04/21/2013 1526   GLU 133 08/27/2012 0000      Component Value Date/Time   CALCIUM 8.8 (L) 09/29/2020 1722   ALKPHOS 69 08/15/2020 1000   AST 19 08/15/2020 1000   ALT 6 08/15/2020 1000   BILITOT 0.6 08/15/2020 1000   BILITOT 0.4 03/30/2020 1217       Lab Results  Component Value Date   WBC 5.3 09/29/2020   HGB 9.2 (L) 09/29/2020   HCT 28.8 (L) 09/29/2020   MCV 74.2 (L) 09/29/2020   PLT 315 09/29/2020    Lab Results  Component Value Date   TSH 1.390 09/10/2020     Total time spent on today's visit was 30 minutes, including both face-to-face time and nonface-to-face time.  Time included that spent on review of records (prior notes available to me/labs/imaging if pertinent), discussing treatment and goals, answering patient's questions and coordinating care.  Cc:  Pcp, No

## 2021-03-12 ENCOUNTER — Other Ambulatory Visit: Payer: Self-pay

## 2021-03-12 ENCOUNTER — Encounter: Payer: Self-pay | Admitting: Neurology

## 2021-03-12 ENCOUNTER — Ambulatory Visit: Payer: Medicare Other | Admitting: Neurology

## 2021-03-12 VITALS — BP 134/70 | HR 84 | Ht 61.0 in | Wt 167.0 lb

## 2021-03-12 DIAGNOSIS — G20 Parkinson's disease: Secondary | ICD-10-CM

## 2021-03-12 MED ORDER — CARBIDOPA-LEVODOPA ER 25-100 MG PO TBCR: EXTENDED_RELEASE_TABLET | ORAL | 1 refills | Status: DC

## 2021-03-12 NOTE — Patient Instructions (Addendum)
STOP carbidopa/levodopa 25/100 IR 2.  START carbidopa/levodopa CR, 2 tablets at 7am, 2 tablets at 11 am and 1 at 4pm 3.  You have been referred to [facility] for therapy. They will call you directly to schedule an appointment.  Please call [phone] if you do not hear from them.

## 2021-03-19 ENCOUNTER — Encounter: Payer: Self-pay | Admitting: Podiatry

## 2021-03-19 ENCOUNTER — Ambulatory Visit: Payer: Medicare Other | Attending: Neurology | Admitting: Physical Therapy

## 2021-03-19 ENCOUNTER — Other Ambulatory Visit: Payer: Self-pay

## 2021-03-19 ENCOUNTER — Ambulatory Visit: Payer: Medicare Other | Admitting: Podiatry

## 2021-03-19 DIAGNOSIS — M79675 Pain in left toe(s): Secondary | ICD-10-CM | POA: Diagnosis not present

## 2021-03-19 DIAGNOSIS — M6281 Muscle weakness (generalized): Secondary | ICD-10-CM | POA: Diagnosis present

## 2021-03-19 DIAGNOSIS — M25611 Stiffness of right shoulder, not elsewhere classified: Secondary | ICD-10-CM | POA: Insufficient documentation

## 2021-03-19 DIAGNOSIS — B351 Tinea unguium: Secondary | ICD-10-CM | POA: Diagnosis not present

## 2021-03-19 DIAGNOSIS — R29818 Other symptoms and signs involving the nervous system: Secondary | ICD-10-CM | POA: Insufficient documentation

## 2021-03-19 DIAGNOSIS — E119 Type 2 diabetes mellitus without complications: Secondary | ICD-10-CM | POA: Diagnosis not present

## 2021-03-19 DIAGNOSIS — R2681 Unsteadiness on feet: Secondary | ICD-10-CM | POA: Insufficient documentation

## 2021-03-19 DIAGNOSIS — M79674 Pain in right toe(s): Secondary | ICD-10-CM

## 2021-03-19 DIAGNOSIS — R29898 Other symptoms and signs involving the musculoskeletal system: Secondary | ICD-10-CM | POA: Insufficient documentation

## 2021-03-19 DIAGNOSIS — R2689 Other abnormalities of gait and mobility: Secondary | ICD-10-CM | POA: Insufficient documentation

## 2021-03-19 DIAGNOSIS — M25621 Stiffness of right elbow, not elsewhere classified: Secondary | ICD-10-CM | POA: Diagnosis present

## 2021-03-19 DIAGNOSIS — R293 Abnormal posture: Secondary | ICD-10-CM | POA: Insufficient documentation

## 2021-03-19 DIAGNOSIS — R278 Other lack of coordination: Secondary | ICD-10-CM | POA: Diagnosis present

## 2021-03-19 DIAGNOSIS — N289 Disorder of kidney and ureter, unspecified: Secondary | ICD-10-CM

## 2021-03-19 NOTE — Therapy (Addendum)
[facility] [address] [place], [place], [address] Phone: [phone]   Fax:  [phone]  Physical Therapy Evaluation  Patient Details  Name: Anna Bernard MRN: [id number] Date of Birth: 06-26-1951 Referring Provider (PT): Kerin Salen, DO   Encounter Date: 03/19/2021   PT End of Session - 03/19/21 1641     Visit Number 1    Number of Visits 13    Date for PT Re-Evaluation 06/17/21    Authorization Type [employer] Medicare    PT Start Time 1535    PT Stop Time 1615    PT Time Calculation (min) 40 min    Equipment Utilized During Treatment Gait belt    Activity Tolerance Patient tolerated treatment well    Behavior During Therapy WFL for tasks assessed/performed;Flat affect             Past Medical History:  Diagnosis Date   Anemia, unspecified    Hyperlipidemia, unspecified    Hypertension    Hypothyroidism, unspecified    Pre-diabetes    Vitamin B12 deficiency     Past Surgical History:  Procedure Laterality Date   ABDOMINAL HYSTERECTOMY     GIVENS CAPSULE STUDY N/A 04/16/2018   Procedure: GIVENS CAPSULE STUDY;  Surgeon: Jeani Hawking, MD;  Location[facility] 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;  Surgeon: Carmela Rima, MD;  Location: [facility] OR;  Service: Ophthalmology;  Laterality: Right;   PARS PLANA VITRECTOMY Right 09/29/2020   Procedure: PARS PLANA VITRECTOMY WITH 25 GAUGE - ENDOCAUTRY;  Surgeon: Carmela Rima, MD;  Location: [facility] OR;  Service: Ophthalmology;  Laterality: Right;   PHOTOCOAGULATION WITH LASER Right 08/16/2020   Procedure: PHOTOCOAGULATION WITH LASER;  Surgeon: Carmela Rima, MD;  Location: [facility] OR;  Service: Ophthalmology;  Laterality: Right;   PHOTOCOAGULATION WITH LASER Right 09/29/2020   Procedure: PHOTOCOAGULATION WITH LASER;  Surgeon: Carmela Rima,  MD;  Location: [facility] OR;  Service: Ophthalmology;  Laterality: Right;   SHOULDER SURGERY     TONSILLECTOMY     TUBAL LIGATION      There were no vitals filed for this visit.    Subjective Assessment - 03/19/21 1538     Subjective Feels like her walking has gotten slower. No recent falls. Saw Dr. Arbutus Leas last week and made some changes to her medication.  Podiatrist prescribed an [place] brace for her RLE, has not been able to find a shoe to use with it. Last seen for therapy at this clinic january 2022. Has not been doing any kind of exercise or walking at home. Reports feeling more depressed (reports physician is aware).    Patient is accompained by: Family member   [name], husband   Pertinent History Dx with Parkinson's disease (Dr. Arbutus Leas) 04/2020; PMH of HTN, DM, hypothyroidism, orthostatic hypotension    Limitations Walking    Patient Stated Goals wants to get back on track with an exercise program.    Currently in Pain? No/denies    Pain Onset More than a month ago                [facility] PT Assessment - 03/19/21 1543       Assessment   Medical Diagnosis Parkinson's Disease    Referring Provider (PT) Kerin Salen, DO    Onset Date/Surgical Date 03/12/21   date of referral, diagnosed 04/2020   Hand Dominance Right  Prior Therapy previous PT at this clinic, end of 2021      Precautions   Precautions Fall    Required Braces or Orthoses Other Brace/Splint    Other Brace/Splint has an [place] brace for RLE (got it 2 weeks ago), have not been able to find a shoe that she can wear with it      Balance Screen   Has the patient fallen in the past 6 months No    Has the patient had a decrease in activity level because of a fear of falling?  No    Is the patient reluctant to leave their home because of a fear of falling?  No      Home Nurse, mental health Private residence    Living Arrangements Spouse/significant other    Home Access Stairs to enter    Entrance Stairs-Number  of Steps 5    Entrance Stairs-Rails Can reach both    Home Layout Two level;Able to live on main level with bedroom/bathroom    Home Equipment Walker - 4 wheels;Shower seat;Cane - single point;Grab bars - tub/shower;Grab bars - toilet    Additional Comments Uses rollator at all times. husband helps with cooking and bathing      Prior Function   Level of Independence Independent with household mobility with device;Independent with community mobility with device;Needs assistance with ADLs    Vocation Retired    [employer] retired approx 1.5 years ago from [employer]    Leisure watching movies      Observation/Other Assessments   Observations incr swelling to R ankle      Sensation   Light Touch Appears Intact      Coordination   Gross Motor Movements are Fluid and Coordinated No    Heel Shin Test slower to perform with RLE      Posture/Postural Control   Posture/Postural Control Postural limitations    Postural Limitations Rounded Shoulders;Flexed trunk      ROM / Strength   AROM / PROM / Strength Strength      Strength   Right/Left Hip Right;Left    Right Hip Flexion 4-/5    Left Hip Flexion 4-/5    Right Knee Flexion 4/5    Right Knee Extension 4/5    Left Knee Flexion 4+/5    Left Knee Extension 4+/5    Right Ankle Dorsiflexion 4+/5    Left Ankle Dorsiflexion 4+/5      Transfers   Transfers Sit to Stand;Stand to Sit    Sit to Stand 5: Supervision;With upper extremity assist;From chair/3-in-1    Five time sit to stand comments  21.50 seconds with BUE support on chair, flexed posture in standing    Stand to Sit 5: Supervision;To chair/3-in-1;With upper extremity assist;To bed      Ambulation/Gait   Ambulation/Gait Yes    Ambulation/Gait Assistance 5: Supervision    Ambulation/Gait Assistance Details pt with incr forward flexed posture and shuffling gait (esp RLE) after approx. 1 minute of walking    Ambulation Distance (Feet) 185 Feet   during   Assistive  device Rollator    Gait Pattern Step-through pattern;Decreased step length - right;Decreased step length - left;Decreased stance time - right;Decreased stance time - left;Decreased dorsiflexion - right;Decreased dorsiflexion - left;Shuffle;Poor foot clearance - left;Poor foot clearance - right    Ambulation Surface Level;Indoor    Gait velocity 17.63 seconds = 1.86 ft/sec      6 Minute Walk- Baseline  6 Minute Walk- Baseline yes    HR (bpm) 93    02 Sat (%RA) 98 %    Modified Borg Scale for Dyspnea 0- Nothing at all      6 Minute walk- Post Test   6 Minute Walk Post Test yes    HR (bpm) 91    02 Sat (%RA) 97 %    Modified Borg Scale for Dyspnea 1- Very mild shortness of breath    Perceived Rate of Exertion (Borg) 13- Somewhat hard      6 minute walk test results    Aerobic Endurance Distance Walked 185    Endurance additional comments modified 2MWT      Timed Up and Go Test   Normal TUG (seconds) 19.6    TUG Comments with rollator                        Objective measurements completed on examination: See above findings.               PT Education - 03/19/21 1641     Education Details clinical findings, POC, taking longer steps to help with energy conservation.    Person(s) Educated Patient;Spouse    Methods Explanation    Comprehension Verbalized understanding              PT Short Term Goals - 03/22/21 1702       PT SHORT TERM GOAL #1   Title Pt will perform initial HEP with family supervision, for improved balance, stregnth, transfers, and gait.  ALL STGS DUE 04/19/21    Time 4    Period Weeks    Status New    Target Date 04/19/21      PT SHORT TERM GOAL #2   Title Pt will improve gait speed with rollator to at least 1.95 ft/sec in order to demo improved gait effieicency.    Time 4    Period Weeks    Status New              PT Long Term Goals - 03/22/21 1704       PT LONG TERM GOAL #1   Title Pt will perform progression  of HEP and walking program, including verbalizing plans for optimal fitness plan upon d/c from PT.  ALL LTGS DUE 05/17/21.    Time 8    Period Weeks    Status New    Target Date 05/17/21      PT LONG TERM GOAL #2   Title Pt will improve 5x sit<>stand in less than or equal to 18 seconds for improved transfer efficiency and functional strength.    Baseline 21.50 seconds    Time 8    Period Weeks    Status New      PT LONG TERM GOAL #3   Title Pt will improve gait velocity to at least 2.3 ft/sec for improved gait efficiency and safety.    Baseline 1.86 ft/sec with rollator.    Time 8    Period Weeks    Status New      PT LONG TERM GOAL #4   Title Pt will improve 2MWT distance with rollator to at least 210' feet for improved gait efficiency and endurance.    Baseline 185' with rollator    Time 8    Period Weeks    Status New      PT LONG TERM GOAL #5   Title Pt and pt's husband  will verbalize understanding of local PD resources.    Time 8    Period Weeks    Status New                  03/22/21 1659  Plan  Clinical Impression Statement Patient is a 70 year old female referred to Neuro OPPT for PD   Pt's PMH is significant for: HTN, DM, hypothyroidism, orthostatic hypotension. The following deficits were present during the exam: bradykinesia, decr strength, impaired balance, gait abnormalities, abnormal posture, decr activity tolerance. Based on 5x sit <> stand, TUG, and gait pt is an incr risk for falls. Pt would benefit from skilled PT to address these impairments and functional limitations to maximize functional mobility independence  Personal Factors and Comorbidities Comorbidity 3+  Comorbidities PMH:  HTN, DM, hypothyroidism, orthostatic hypotension  Examination-Activity Limitations Locomotion Level;Stand;Transfers;Stairs  Examination-Participation Restrictions Community Activity;Other (Plans for travel in retirement)  Pt will benefit from skilled therapeutic  intervention in order to improve on the following deficits Abnormal gait;Difficulty walking;Decreased balance;Decreased mobility;Decreased strength;Postural dysfunction;Decreased activity tolerance;Decreased endurance;Decreased coordination  Stability/Clinical Decision Making Evolving/Moderate complexity  Rehab Potential Good  PT Frequency 2x / week (12 visits over 12 weeks)  PT Duration 12 weeks (12 visits over 12 weeks)  PT Treatment/Interventions ADLs/Self Care Home Management;DME Instruction;Neuromuscular re-education;Balance training;Therapeutic exercise;Therapeutic activities;Functional mobility training;Gait training;Patient/family education;Manual techniques;Stair training;Orthotic Fit/Training  PT Next Visit Plan initial HEP - seated PWR moves and reviewing what pt was given last bout of therapy. gait training with rollator - with foot clearance and incr step length.  Consulted and Agree with Plan of Care Patient  Family Member Consulted husband, [name]         Patient will benefit from skilled therapeutic intervention in order to improve the following deficits and impairments:     Visit Diagnosis: Other symptoms and signs involving the nervous system  Abnormal posture  Muscle weakness (generalized)  Unsteadiness on feet  Other abnormalities of gait and mobility     Problem List Patient Active Problem List   Diagnosis Date Noted   Pain due to onychomycosis of toenails of both feet 06/17/2020   Parkinson's disease (HCC) 04/23/2020   Leg swelling 11/26/2019   Chronic venous insufficiency 11/26/2019   Vitamin B12 deficiency 02/06/2018   Sepsis secondary to UTI (HCC) 01/29/2018   Microcytic anemia 01/29/2018   Renal insufficiency 01/29/2018   Pure hypercholesterolemia 12/24/2014   Diabetes mellitus type 2, controlled, without complications (HCC) 12/24/2014   Bruit of right carotid artery 12/24/2014   Thyroid disease 07/06/2014   Severe obesity (BMI >= 40) (HCC)  07/06/2014   Essential hypertension 07/06/2014    Drake Leach, PT, DPT  03/19/2021, 4:42 PM   [facility] [address] [place], [place], [address] Phone: [phone]   Fax:  [phone]  Name: Anna Bernard MRN: [id number] Date of Birth: 1950/09/22

## 2021-03-19 NOTE — Progress Notes (Signed)
This patient returns to my office for at risk foot care.  This patient requires this care by a professional since this patient will be at risk due to having diabetes, chronic kidney disease.   This patient is unable to cut nails herself since the patient cannot reach her nails.These nails are painful walking and wearing shoes.  This patient presents for at risk foot care today.  General Appearance  Alert, conversant and in no acute stress.  Vascular  Dorsalis pedis pulses are palpable  bilaterally. Posterior tibial pulses are absent. Capillary return is within normal limits  bilaterally. Cold feet bilaterally. Absent digital hair.  Neurologic  Senn-Weinstein monofilament wire test within normal limits left foot.  LOPS right absent.. Muscle power within normal limits bilaterally.  Nails Thick disfigured discolored nails with subungual debris  from hallux to fifth toes bilaterally. No evidence of bacterial infection or drainage bilaterally.  Orthopedic  No limitations of motion  feet .  No crepitus or effusions noted.  No bony pathology or digital deformities noted. PTTD right foot.  DJD right reardoot medially.  Skin  normotropic skin with no porokeratosis noted bilaterally.  No signs of infections or ulcers noted.     Onychomycosis  Pain in right toes  Pain in left toes  Consent was obtained for treatment procedures.   Mechanical debridement of nails 1-5  bilaterally performed with a nail nipper.  Filed with dremel without incident.    Return office visit  3 months                    Told patient to return for periodic foot care and evaluation due to potential at risk complications.   Helane Gunther DPM

## 2021-03-22 NOTE — Addendum Note (Signed)
Addended by: Drake Leach on: 03/22/2021 05:12 PM   Modules accepted: Orders

## 2021-03-23 ENCOUNTER — Telehealth: Payer: Self-pay | Admitting: Physical Therapy

## 2021-03-23 DIAGNOSIS — G20 Parkinson's disease: Secondary | ICD-10-CM

## 2021-03-23 NOTE — Telephone Encounter (Signed)
Dr. Arbutus Leas, Anna Bernard was evaluated by Physical Therapy on 03/19/21.  The patient would benefit from an OT referral for bradykinesia and decr coordination/strength due to PD.   If you agree, please place an order in [facility] in [facility] or fax the order to [phone]. Thank you, Sherlie Ban, PT, DPT 03/23/21 10:58 AM    [facility] [address] [place], [place]  [address] Phone:  [phone] Fax:  [phone]

## 2021-04-15 ENCOUNTER — Other Ambulatory Visit: Payer: Self-pay

## 2021-04-15 ENCOUNTER — Encounter: Payer: Self-pay | Admitting: Physical Therapy

## 2021-04-15 ENCOUNTER — Encounter: Payer: Medicare Other | Admitting: Occupational Therapy

## 2021-04-15 ENCOUNTER — Ambulatory Visit: Payer: Medicare Other | Admitting: Physical Therapy

## 2021-04-15 DIAGNOSIS — R29818 Other symptoms and signs involving the nervous system: Secondary | ICD-10-CM | POA: Diagnosis not present

## 2021-04-15 DIAGNOSIS — R2681 Unsteadiness on feet: Secondary | ICD-10-CM

## 2021-04-15 DIAGNOSIS — R2689 Other abnormalities of gait and mobility: Secondary | ICD-10-CM

## 2021-04-15 DIAGNOSIS — M6281 Muscle weakness (generalized): Secondary | ICD-10-CM

## 2021-04-15 NOTE — Patient Instructions (Signed)
   Only provided instructions for PwR! Up in sitting this visit, 10 reps, once per day

## 2021-04-15 NOTE — Therapy (Signed)
[facility] [address] [place], [place], [address] Phone: [phone]   Fax:  [phone]  Physical Therapy Treatment  Patient Details  Name: Anna Bernard MRN: [id number] Date of Birth: April 30, 1951 Referring Provider (PT): Kerin Salen, DO   Encounter Date: 04/15/2021   PT End of Session - 04/15/21 1317     Visit Number 2    Number of Visits 13    Date for PT Re-Evaluation 06/17/21    Authorization Type UHC Medicare    PT Start Time 1319    PT Stop Time 1401    PT Time Calculation (min) 42 min    Equipment Utilized During Treatment Gait belt    Activity Tolerance Patient tolerated treatment well    Behavior During Therapy WFL for tasks assessed/performed;Flat affect             Past Medical History:  Diagnosis Date   Anemia, unspecified    Hyperlipidemia, unspecified    Hypertension    Hypothyroidism, unspecified    Pre-diabetes    Vitamin B12 deficiency     Past Surgical History:  Procedure Laterality Date   ABDOMINAL HYSTERECTOMY     GIVENS CAPSULE STUDY N/A 04/16/2018   Procedure: GIVENS CAPSULE STUDY;  Surgeon: Jeani Hawking, MD;  Location: [facility] 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;  Surgeon: Carmela Rima, MD;  Location: [facility] OR;  Service: Ophthalmology;  Laterality: Right;   PARS PLANA VITRECTOMY Right 09/29/2020   Procedure: PARS PLANA VITRECTOMY WITH 25 GAUGE - ENDOCAUTRY;  Surgeon: Carmela Rima, MD;  Location: [facility] OR;  Service: Ophthalmology;  Laterality: Right;   PHOTOCOAGULATION WITH LASER Right 08/16/2020   Procedure: PHOTOCOAGULATION WITH LASER;  Surgeon: Carmela Rima, MD;  Location: [facility] OR;  Service: Ophthalmology;  Laterality: Right;   PHOTOCOAGULATION WITH LASER Right 09/29/2020   Procedure: PHOTOCOAGULATION WITH LASER;  Surgeon: Carmela Rima,  MD;  Location: [facility] OR;  Service: Ophthalmology;  Laterality: Right;   SHOULDER SURGERY     TONSILLECTOMY     TUBAL LIGATION      There were no vitals filed for this visit.   Subjective Assessment - 04/15/21 1324     Subjective No changes since evaluation, other than I am weak.  Feel weak since I've been on the medication.  Wearing a different brace today-can't find shoes to fit the [place] brace.    Patient is accompained by: --    Pertinent History Dx with Parkinson's disease (Dr. Arbutus Leas) 04/2020; PMH of HTN, DM, hypothyroidism, orthostatic hypotension    Limitations Walking    Patient Stated Goals wants to get back on track with an exercise program.    Currently in Pain? No/denies    Pain Onset More than a month ago                      PWR! Moves in sitting:  PWR! Up for posture, x 10 reps PWR! Rock for weightshifting, 10 reps through hips only, then 10 reps with added UE reach.  Cues for open hands and for extending elbows (pt tends to keep elbows bent) PWR! Twist for trunk rotation x 1 (attempted with open arms>clap each side).  Pt with difficulty with enough range to reach to clap.  Modified with reach and look over shoulder, 5 reps each side. PWR! Step for step initiation, single step out  and in, 10 reps   Visual, verbal cues for technique.   Several additional reps of each exercise to start; initiated PWR! Up for initial HEP.        [facility] Adult PT Treatment/Exercise - 04/15/21 0001       Transfers   Transfers Sit to Stand;Stand to Sit    Sit to Stand 5: Supervision;With upper extremity assist;From bed    Stand to Sit 5: Supervision;With upper extremity assist;To bed      Ambulation/Gait   Ambulation/Gait Yes    Ambulation/Gait Assistance 5: Supervision    Ambulation Distance (Feet) 115 Feet   100   Assistive device Rollator    Gait Pattern Step-through pattern;Decreased step length - right;Decreased step length - left;Decreased stance time -  right;Decreased stance time - left;Decreased dorsiflexion - right;Decreased dorsiflexion - left;Shuffle;Poor foot clearance - left;Poor foot clearance - right    Ambulation Surface Level;Indoor    Gait Comments Difficulty initiating gait:  upon standing, PT instructs patient to rock side to side, then step BIG with RLE to start.  Cues with gait for upright psoture, stay close to rollator and increased RLE step length.                    PT Education - 04/15/21 1519     Education Details Initiated HEP-seated PWR! Moves    Person(s) Educated Patient    Methods Explanation;Demonstration;Verbal cues;Handout    Comprehension Verbalized understanding;Returned demonstration;Verbal cues required              PT Short Term Goals - 03/22/21 1702       PT SHORT TERM GOAL #1   Title Pt will perform initial HEP with family supervision, for improved balance, stregnth, transfers, and gait.  ALL STGS DUE 04/19/21    Time 4    Period Weeks    Status New    Target Date 04/19/21      PT SHORT TERM GOAL #2   Title Pt will improve gait speed with rollator to at least 1.95 ft/sec in order to demo improved gait effieicency.    Time 4    Period Weeks    Status New               PT Long Term Goals - 03/22/21 1704       PT LONG TERM GOAL #1   Title Pt will perform progression of HEP and walking program, including verbalizing plans for optimal fitness plan upon d/c from PT.  ALL LTGS DUE 05/17/21.    Time 8    Period Weeks    Status New    Target Date 05/17/21      PT LONG TERM GOAL #2   Title Pt will improve 5x sit<>stand in less than or equal to 18 seconds for improved transfer efficiency and functional strength.    Baseline 21.50 seconds    Time 8    Period Weeks    Status New      PT LONG TERM GOAL #3   Title Pt will improve gait velocity to at least 2.3 ft/sec for improved gait efficiency and safety.    Baseline 1.86 ft/sec with rollator.    Time 8    Period Weeks     Status New      PT LONG TERM GOAL #4   Title Pt will improve distance with rollator to at least 210' feet for improved gait efficiency and endurance.    Baseline 185'  with rollator    Time 8    Period Weeks    Status New      PT LONG TERM GOAL #5   Title Pt and pt's husband will verbalize understanding of local PD resources.    Time 8    Period Weeks    Status New                   Plan - 04/15/21 1522     Clinical Impression Statement Pt returns today for first visit after evaluation.  Initiated seated PWR! Moves (PWR! Up only today, as pt will be back in clinic tomorrow and plan to add more then).  Pt performs seated PWR! Moves well with visual and verbal cues.  Pt does demonstrate difficulty with initiation of gait, and responds well to cues for lateral rocking and BIG step to start.  She requires frequent cues with gait for posture and step length.  Pt will benefit from continued skilled PT to further address balance, gait, functional strength deficits to improve mobility and decrease fall risk.    Personal Factors and Comorbidities Comorbidity 3+    Comorbidities PMH:  HTN, DM, hypothyroidism, orthostatic hypotension    Examination-Activity Limitations Locomotion Level;Stand;Transfers;Stairs    Examination-Participation Restrictions Community Activity;Other   Plans for travel in retirement   Stability/Clinical Decision Making Evolving/Moderate complexity    Rehab Potential Good    PT Frequency 2x / week   12 visits over 12 weeks   PT Duration 12 weeks   12 visits over 12 weeks   PT Treatment/Interventions ADLs/Self Care Home Management;DME Instruction;Neuromuscular re-education;Balance training;Therapeutic exercise;Therapeutic activities;Functional mobility training;Gait training;Patient/family education;Manual techniques;Stair training;Orthotic Fit/Training    PT Next Visit Plan Review and add to initial HEP - seated PWR moves; gait training with rollator - with foot  clearance and incr step length.    Consulted and Agree with Plan of Care Patient             Patient will benefit from skilled therapeutic intervention in order to improve the following deficits and impairments:  Abnormal gait, Difficulty walking, Decreased balance, Decreased mobility, Decreased strength, Postural dysfunction, Decreased activity tolerance, Decreased endurance, Decreased coordination  Visit Diagnosis: Unsteadiness on feet  Other abnormalities of gait and mobility  Muscle weakness (generalized)     Problem List Patient Active Problem List   Diagnosis Date Noted   Pain due to onychomycosis of toenails of both feet 06/17/2020   Parkinson's disease (HCC) 04/23/2020   Leg swelling 11/26/2019   Chronic venous insufficiency 11/26/2019   Vitamin B12 deficiency 02/06/2018   Sepsis secondary to UTI (HCC) 01/29/2018   Microcytic anemia 01/29/2018   Renal insufficiency 01/29/2018   Pure hypercholesterolemia 12/24/2014   Diabetes mellitus type 2, controlled, without complications (HCC) 12/24/2014   Bruit of right carotid artery 12/24/2014   Thyroid disease 07/06/2014   Severe obesity (BMI >= 40) (HCC) 07/06/2014   Essential hypertension 07/06/2014    Krystalle Pilkington W. 04/15/2021, 3:27 PM Gean Maidens., PT  [facility] [address] [place], [place], [address] Phone: [phone]   Fax:  [phone]  Name: Anna Bernard MRN: [id number] Date of Birth: 1951-05-03

## 2021-04-16 ENCOUNTER — Ambulatory Visit: Payer: Medicare Other | Admitting: Occupational Therapy

## 2021-04-16 ENCOUNTER — Encounter: Payer: Self-pay | Admitting: Physical Therapy

## 2021-04-16 ENCOUNTER — Ambulatory Visit: Payer: Medicare Other | Admitting: Physical Therapy

## 2021-04-16 DIAGNOSIS — M6281 Muscle weakness (generalized): Secondary | ICD-10-CM

## 2021-04-16 DIAGNOSIS — R29818 Other symptoms and signs involving the nervous system: Secondary | ICD-10-CM | POA: Diagnosis not present

## 2021-04-16 DIAGNOSIS — R278 Other lack of coordination: Secondary | ICD-10-CM

## 2021-04-16 DIAGNOSIS — R293 Abnormal posture: Secondary | ICD-10-CM

## 2021-04-16 DIAGNOSIS — R29898 Other symptoms and signs involving the musculoskeletal system: Secondary | ICD-10-CM

## 2021-04-16 DIAGNOSIS — R2689 Other abnormalities of gait and mobility: Secondary | ICD-10-CM

## 2021-04-16 DIAGNOSIS — M25611 Stiffness of right shoulder, not elsewhere classified: Secondary | ICD-10-CM

## 2021-04-16 DIAGNOSIS — M25621 Stiffness of right elbow, not elsewhere classified: Secondary | ICD-10-CM

## 2021-04-16 DIAGNOSIS — R2681 Unsteadiness on feet: Secondary | ICD-10-CM

## 2021-04-16 NOTE — Therapy (Signed)
[facility] [address] [place], [place], [address] Phone: [phone]   Fax:  [phone]  Occupational Therapy Evaluation  Patient Details  Name: Anna Bernard MRN: [id number] Date of Birth: 1951-07-22 Referring Provider (OT): Dr. Arbutus Leas   Encounter Date: 04/16/2021   OT End of Session - 04/16/21 1448     Visit Number 1    Number of Visits 25    Date for OT Re-Evaluation 07/09/21    Authorization Type UHC Medicare    Authorization - Visit Number 1    Authorization - Number of Visits 10    Progress Note Due on Visit 10    OT Start Time 0850    OT Stop Time 0930    OT Time Calculation (min) 40 min             Past Medical History:  Diagnosis Date   Anemia, unspecified    Hyperlipidemia, unspecified    Hypertension    Hypothyroidism, unspecified    Pre-diabetes    Vitamin B12 deficiency     Past Surgical History:  Procedure Laterality Date   ABDOMINAL HYSTERECTOMY     GIVENS CAPSULE STUDY N/A 04/16/2018   Procedure: GIVENS CAPSULE STUDY;  Surgeon: Jeani Hawking, MD;  Location: [facility] 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;  Surgeon: Carmela Rima, MD;  Location: [facility] OR;  Service: Ophthalmology;  Laterality: Right;   PARS PLANA VITRECTOMY Right 09/29/2020   Procedure: PARS PLANA VITRECTOMY WITH 25 GAUGE - ENDOCAUTRY;  Surgeon: Carmela Rima, MD;  Location: [facility] OR;  Service: Ophthalmology;  Laterality: Right;   PHOTOCOAGULATION WITH LASER Right 08/16/2020   Procedure: PHOTOCOAGULATION WITH LASER;  Surgeon: Carmela Rima, MD;  Location: [facility] OR;  Service: Ophthalmology;  Laterality: Right;   PHOTOCOAGULATION WITH LASER Right 09/29/2020   Procedure: PHOTOCOAGULATION WITH LASER;  Surgeon: Carmela Rima, MD;  Location: [facility] OR;  Service: Ophthalmology;  Laterality: Right;    SHOULDER SURGERY     TONSILLECTOMY     TUBAL LIGATION      There were no vitals filed for this visit.   Subjective Assessment - 04/16/21 0857     Patient Stated Goals get better    Currently in Pain? Yes    Pain Score 3     Pain Location Foot    Pain Orientation Left    Pain Descriptors / Indicators Aching    Pain Type Chronic pain    Pain Onset More than a month ago    Pain Frequency Intermittent    Aggravating Factors  malpositioning    Pain Relieving Factors repostioning               [facility] OT Assessment - 04/16/21 0859       Assessment   Medical Diagnosis Parkinson's Disease    Referring Provider (OT) Dr. Arbutus Leas    Onset Date/Surgical Date 03/12/21    Hand Dominance Right    Prior Therapy OT at this clinic      Precautions   Precautions Fall    Required Braces or Orthoses Other Brace/Splint      Balance Screen   Has the patient fallen in the past 6 months Yes    How many times? 3    Has the patient had a decrease in activity level because of a fear of falling?  Yes    Is the  patient reluctant to leave their home because of a fear of falling?  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 [employer]    Leisure watching movies      ADL   Eating/Feeding Modified independent    Grooming Modified independent    Upper Body Bathing Supervision/safety    Lower Body Bathing Supervision/safety    Upper Body Dressing Independent    Lower Body Dressing Minimal assistance   for shoes   Chief Executive Officer Supervision/safety      IADL   Shopping Completely unable to shop    Light Housekeeping Performs light daily tasks but cannot maintain acceptable level of cleanliness    Meal Prep Able to complete simple cold meal and snack prep    Medication Management Is responsible for  taking medication in correct dosages at correct time      Mobility   Mobility Status --   modified I with rollator     Written Expression   Dominant Hand Right    Handwriting 100% legible      Vision Assessment   Vision Assessment Vision not tested      Cognition   Overall Cognitive Status Cognition to be further assessed in functional context PRN      Observation/Other Assessments   Other Surveys  Select    Physical Performance Test   Yes    Simulated Eating Comments 15.50    Donning Doffing Jacket Time (seconds) 25    Donning Doffing Jacket Comments 3 button 29.16      Posture/Postural Control   Posture/Postural Control Postural limitations    Postural Limitations Rounded Shoulders;Flexed trunk      Sensation   Light Touch Appears Intact      Coordination   Gross Motor Movements are Fluid and Coordinated No    Fine Motor Movements are Fluid and Coordinated No    9 Hole Peg Test Right;Left    Right 9 Hole Peg Test 44.34    Left 9 Hole Peg Test 40.97    Box and Blocks R 48  L 42      ROM / Strength   AROM / PROM / Strength AROM      AROM   Overall AROM  Deficits    Overall AROM Comments RUE shoulder flexion 115, elbow extension -15, LUE shoulder flexion 115, elbow extension -5                               OT Short Term Goals - 04/16/21 1438       OT SHORT TERM GOAL #1   Title Pt will be independent with PD-specific HEP--check STGs 05/14/21    Time 4    Period Weeks    Status New    Target Date 05/14/21      OT SHORT TERM GOAL #2   Title Pt will verbalize understanding of adapted strategies to maximize safety and I with ADLs/ IADLs .    Time 4    Period Weeks    Status New      OT SHORT TERM GOAL #3   Title Pt will improve R hand coordination for ADLs as shown by improving time on 9-hole peg test by at least 3  secs    Baseline 44.34    Time 4    Period Weeks    Status New      OT SHORT TERM GOAL #4   Title Pt willdemonstrate  improved ease with dressing as evidenced by  performing  PPT#4 donning/ doffing 18 secs or less    Time 4    Period Weeks    Status New               OT Long Term Goals - 04/16/21 1444       OT LONG TERM GOAL #1   Title Pt will verbalize understanding of ways to prevent future PD related complications and PD community resources.    Time 12    Period Weeks    Status New    Target Date 07/09/21      OT LONG TERM GOAL #2   Title Pt will verbalize  understanding of ways to keep thinking skills sharp.    Time 12    Period Weeks    Status New      OT LONG TERM GOAL #3   Title Pt will retrieve a lightweight object from overhead shelf with RUE with -10 elbow extension.    Time 12    Period Weeks    Status New      OT LONG TERM GOAL #4   Title Pt will perform LB dressing consistently mod I.    Time 12    Period Weeks    Status New                   Plan - 04/16/21 1436     Clinical Impression Statement Pt is a 70 y.o. female with Parkinson's disease who returns to OT with a decline in ADLs and functional mobility.  Pt with PMH that includes:anemia, hyperlipidemia, orthostatic hypotension, HTN, hypothyroidism, Vitamin B12 deficiency, hx of R RTC surgery, leg swelling, arthritis (per pt), obesity.  Pt also with hx of fall/fear of falling.   Pt presents today with bradykinesia, rigidity, decr coordination, decr posture, decr balance and functional mobility for ADLs, decr ROM, and decr dominant RUE functional use.    Pt would benefit from occupational therapy to address these deficits for incr safety/independence and ease with ADLs/IADLs, to decr risk of future complications related to PD, incr dominant RUE functional use, and improve quality of life.    OT Occupational Profile and History Problem Focused Assessment - Including review of records relating to presenting problem    Occupational performance deficits (Please refer to evaluation for details):  ADL's;IADL's;Leisure;Social Participation    Body Structure / Function / Physical Skills ADL;UE functional use;Balance;Flexibility;FMC;Gait;ROM;Coordination;GMC;Decreased knowledge of precautions;Decreased knowledge of use of DME;IADL;Dexterity;Strength;Mobility    Rehab Potential Good    Clinical Decision Making Limited treatment options, no task modification necessary    Comorbidities Affecting Occupational Performance: May have comorbidities impacting occupational performance    Modification or Assistance to Complete Evaluation  No modification of tasks or assist necessary to complete eval    OT Frequency 2x / week   anticipate d/c after 8 weeks   OT Duration 12 weeks    OT Treatment/Interventions Self-care/ADL training;Energy conservation;Patient/family education;DME and/or AE instruction;Aquatic Therapy;Balance training;Fluidtherapy;Building services engineer;Therapeutic exercise;Moist Heat;Manual Therapy;Therapeutic activities;Neuromuscular education    Plan initate HEP coordination, PWR! seated , ADL strategies    Consulted and Agree with Plan of Care Patient             Patient will benefit from skilled  therapeutic intervention in order to improve the following deficits and impairments:   Body Structure / Function / Physical Skills: ADL, UE functional use, Balance, Flexibility, FMC, Gait, ROM, Coordination, GMC, Decreased knowledge of precautions, Decreased knowledge of use of DME, IADL, Dexterity, Strength, Mobility       Visit Diagnosis: Muscle weakness (generalized) - Plan: Ot plan of care cert/re-cert  Other symptoms and signs involving the nervous system - Plan: Ot plan of care cert/re-cert  Abnormal posture - Plan: Ot plan of care cert/re-cert  Other lack of coordination - Plan: Ot plan of care cert/re-cert  Other symptoms and signs involving the musculoskeletal system - Plan: Ot plan of care cert/re-cert  Stiffness of right elbow, not elsewhere classified - Plan:  Ot plan of care cert/re-cert  Stiffness of right shoulder, not elsewhere classified - Plan: Ot plan of care cert/re-cert  Other abnormalities of gait and mobility - Plan: Ot plan of care cert/re-cert    Problem List Patient Active Problem List   Diagnosis Date Noted   Pain due to onychomycosis of toenails of both feet 06/17/2020   Parkinson's disease (HCC) 04/23/2020   Leg swelling 11/26/2019   Chronic venous insufficiency 11/26/2019   Vitamin B12 deficiency 02/06/2018   Sepsis secondary to UTI (HCC) 01/29/2018   Microcytic anemia 01/29/2018   Renal insufficiency 01/29/2018   Pure hypercholesterolemia 12/24/2014   Diabetes mellitus type 2, controlled, without complications (HCC) 12/24/2014   Bruit of right carotid artery 12/24/2014   Thyroid disease 07/06/2014   Severe obesity (BMI >= 40) (HCC) 07/06/2014   Essential hypertension 07/06/2014    Anna Bernard 04/16/2021, 2:54 PM  [facility] [address] [place], [place], [address] Phone: [phone]   Fax:  [phone]  Name: Anna Bernard MRN: [id number] Date of Birth: 11/14/1950

## 2021-04-16 NOTE — Therapy (Signed)
[facility] [address] [place], [place], [address] Phone: [phone]   Fax:  [phone]  Physical Therapy Treatment  Patient Details  Name: Anna Bernard MRN: [id number] Date of Birth: [birth year] Referring Provider (PT): Kerin Salen, DO   Encounter Date: 04/16/2021   PT End of Session - 04/16/21 0939     Visit Number 3    Number of Visits 13    Date for PT Re-Evaluation 06/17/21    Authorization Type UHC Medicare    PT Start Time 0934    PT Stop Time 1013    PT Time Calculation (min) 39 min    Equipment Utilized During Treatment Gait belt    Activity Tolerance Patient tolerated treatment well    Behavior During Therapy WFL for tasks assessed/performed;Flat affect             Past Medical History:  Diagnosis Date   Anemia, unspecified    Hyperlipidemia, unspecified    Hypertension    Hypothyroidism, unspecified    Pre-diabetes    Vitamin B12 deficiency     Past Surgical History:  Procedure Laterality Date   ABDOMINAL HYSTERECTOMY     GIVENS CAPSULE STUDY N/A 04/16/2018   Procedure: GIVENS CAPSULE STUDY;  Surgeon: Jeani Hawking, MD;  Location: [facility] 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;  Surgeon: Carmela Rima, MD;  Location: [facility] OR;  Service: Ophthalmology;  Laterality: Right;   PARS PLANA VITRECTOMY Right 09/29/2020   Procedure: PARS PLANA VITRECTOMY WITH 25 GAUGE - ENDOCAUTRY;  Surgeon: Carmela Rima, MD;  Location: [facility] OR;  Service: Ophthalmology;  Laterality: Right;   PHOTOCOAGULATION WITH LASER Right 08/16/2020   Procedure: PHOTOCOAGULATION WITH LASER;  Surgeon: Carmela Rima, MD;  Location: [facility] OR;  Service: Ophthalmology;  Laterality: Right;   PHOTOCOAGULATION WITH LASER Right 09/29/2020   Procedure: PHOTOCOAGULATION WITH LASER;  Surgeon: Carmela Rima,  MD;  Location: [facility] OR;  Service: Ophthalmology;  Laterality: Right;   SHOULDER SURGERY     TONSILLECTOMY     TUBAL LIGATION      There were no vitals filed for this visit.   Subjective Assessment - 04/16/21 0937     Subjective No changes since yesterday; was able to try the exercise last night.    Pertinent History Dx with Parkinson's disease (Dr. Arbutus Leas) 04/2020; PMH of HTN, DM, hypothyroidism, orthostatic hypotension    Limitations Walking    Patient Stated Goals wants to get back on track with an exercise program.    Currently in Pain? Yes    Pain Score 3     Pain Location Generalized    Pain Descriptors / Indicators Aching    Pain Type Chronic pain    Pain Onset More than a month ago    Aggravating Factors  unsure-"just getting older"    Pain Relieving Factors medication, cream                     PWR! Moves in sitting:   PWR! Up for posture, x 10 reps  PWR! Rock for weightshifting, 10 reps through hips only, then 10 reps with added UE reach.  Cues for open hands and for extending elbows (pt tends to keep elbows bent) -additional practice scooting forward and back in chair, initiating and using rocking motion to scoot  PWR! Twist for trunk rotation x 1 (attempted with  open arms>clap each side).  Pt with difficulty with enough range to reach to clap.  Modified with reach and look over shoulder, 5 reps each side.  PWR! Step for step initiation, single step out and in, 10 reps  Cues for technique throughout-verbal, visual.          [facility] Adult PT Treatment/Exercise - 04/16/21 0959       Transfers   Transfers Sit to Stand;Stand to Sit    Sit to Stand 5: Supervision;With upper extremity assist;From bed    Sit to Stand Details Tactile cues for sequencing;Verbal cues for sequencing;Verbal cues for technique    Sit to Stand Details (indicate cue type and reason) Cues for hand placement    Stand to Sit 5: Supervision;With upper extremity assist;To bed       Ambulation/Gait   Ambulation/Gait Yes    Ambulation/Gait Assistance 5: Supervision    Ambulation/Gait Assistance Details Cues to stay close to rollator and for increased RLE foot clearance    Ambulation Distance (Feet) 80 Feet   x 2, 115   Assistive device Rollator    Gait Pattern Step-through pattern;Decreased step length - right;Decreased step length - left;Decreased stance time - right;Decreased stance time - left;Decreased dorsiflexion - right;Decreased dorsiflexion - left;Shuffle;Poor foot clearance - left;Poor foot clearance - right    Ambulation Surface Level;Indoor    Gait Comments Difficulty initiating gait:  upon standing, PT instructs patient to rock side to side, then step BIG with RLE to start.  Cues with gait for upright psoture, stay close to rollator and increased RLE step length.      Exercises   Exercises Knee/Hip      Knee/Hip Exercises: Aerobic   Nustep 4 extremities x 5 minutes, Level 1, for flexibility and strengthening, keeps steps/minute > 60                    PT Education - 04/16/21 1219     Education Details Addition to HEP-see instructions    Person(s) Educated Patient    Methods Explanation;Demonstration;Verbal cues;Handout    Comprehension Verbalized understanding;Returned demonstration;Verbal cues required;Need further instruction              PT Short Term Goals - 03/22/21 1702       PT SHORT TERM GOAL #1   Title Pt will perform initial HEP with family supervision, for improved balance, stregnth, transfers, and gait.  ALL STGS DUE 04/19/21    Time 4    Period Weeks    Status New    Target Date 04/19/21      PT SHORT TERM GOAL #2   Title Pt will improve gait speed with rollator to at least 1.95 ft/sec in order to demo improved gait effieicency.    Time 4    Period Weeks    Status New               PT Long Term Goals - 03/22/21 1704       PT LONG TERM GOAL #1   Title Pt will perform progression of HEP and walking program,  including verbalizing plans for optimal fitness plan upon d/c from PT.  ALL LTGS DUE 05/17/21.    Time 8    Period Weeks    Status New    Target Date 05/17/21      PT LONG TERM GOAL #2   Title Pt will improve 5x sit<>stand in less than or equal to 18 seconds for improved transfer efficiency  and functional strength.    Baseline 21.50 seconds    Time 8    Period Weeks    Status New      PT LONG TERM GOAL #3   Title Pt will improve gait velocity to at least 2.3 ft/sec for improved gait efficiency and safety.    Baseline 1.86 ft/sec with rollator.    Time 8    Period Weeks    Status New      PT LONG TERM GOAL #4   Title Pt will improve 2MWT distance with rollator to at least 210' feet for improved gait efficiency and endurance.    Baseline 185' with rollator    Time 8    Period Weeks    Status New      PT LONG TERM GOAL #5   Title Pt and pt's husband will verbalize understanding of local PD resources.    Time 8    Period Weeks    Status New                   Plan - 04/16/21 1220     Clinical Impression Statement REviewed and added to seated PWR! Moves for HEP this visit.  With transitional movements, pt does a better job of rocking to initiate gait today and appears slightly steadier with gait.  She needs repetitions of exercises today, but seems motivated to perform at home.  She will benefit from skilled PT to further address balance, gait, strength for imrpoved overall mobility towards goals.    Personal Factors and Comorbidities Comorbidity 3+    Comorbidities PMH:  HTN, DM, hypothyroidism, orthostatic hypotension    Examination-Activity Limitations Locomotion Level;Stand;Transfers;Stairs    Examination-Participation Restrictions Community Activity;Other   Plans for travel in retirement   Stability/Clinical Decision Making Evolving/Moderate complexity    Rehab Potential Good    PT Frequency 2x / week   12 visits over 12 weeks   PT Duration 12 weeks   12 visits  over 12 weeks   PT Treatment/Interventions ADLs/Self Care Home Management;DME Instruction;Neuromuscular re-education;Balance training;Therapeutic exercise;Therapeutic activities;Functional mobility training;Gait training;Patient/family education;Manual techniques;Stair training;Orthotic Fit/Training    PT Next Visit Plan Review and add to initial HEP as appropriate; continue gait training with rollator - with foot clearance and incr step length, weigthshfiting.    Consulted and Agree with Plan of Care Patient             Patient will benefit from skilled therapeutic intervention in order to improve the following deficits and impairments:  Abnormal gait, Difficulty walking, Decreased balance, Decreased mobility, Decreased strength, Postural dysfunction, Decreased activity tolerance, Decreased endurance, Decreased coordination  Visit Diagnosis: Unsteadiness on feet  Other abnormalities of gait and mobility  Muscle weakness (generalized)     Problem List Patient Active Problem List   Diagnosis Date Noted   Pain due to onychomycosis of toenails of both feet 06/17/2020   Parkinson's disease (HCC) 04/23/2020   Leg swelling 11/26/2019   Chronic venous insufficiency 11/26/2019   Vitamin B12 deficiency 02/06/2018   Sepsis secondary to UTI (HCC) 01/29/2018   Microcytic anemia 01/29/2018   Renal insufficiency 01/29/2018   Pure hypercholesterolemia 12/24/2014   Diabetes mellitus type 2, controlled, without complications (HCC) 12/24/2014   Bruit of right carotid artery 12/24/2014   Thyroid disease 07/06/2014   Severe obesity (BMI >= 40) (HCC) 07/06/2014   Essential hypertension 07/06/2014    Exzavier Ruderman W. 04/16/2021, 12:24 PM Gean MaidensMARRIOTT,Chelsey Redondo W., PT   [facility]  [address] [place], [place], [address] Phone: [phone]   Fax:  [phone]  Name: Anna Bernard MRN: [id number] Date of Birth: [birth year]

## 2021-04-16 NOTE — Patient Instructions (Signed)
(  Modified seated PWR! Twist)-axial mobility trunk rotation version in sitting  Perform once daily 10 reps

## 2021-04-22 ENCOUNTER — Other Ambulatory Visit: Payer: Self-pay

## 2021-04-22 ENCOUNTER — Ambulatory Visit: Payer: Medicare Other | Attending: Neurology | Admitting: Physical Therapy

## 2021-04-22 ENCOUNTER — Ambulatory Visit: Payer: Medicare Other | Admitting: Occupational Therapy

## 2021-04-22 ENCOUNTER — Encounter: Payer: Self-pay | Admitting: Physical Therapy

## 2021-04-22 DIAGNOSIS — M6281 Muscle weakness (generalized): Secondary | ICD-10-CM

## 2021-04-22 DIAGNOSIS — R293 Abnormal posture: Secondary | ICD-10-CM | POA: Diagnosis present

## 2021-04-22 DIAGNOSIS — R2689 Other abnormalities of gait and mobility: Secondary | ICD-10-CM | POA: Diagnosis present

## 2021-04-22 DIAGNOSIS — R29818 Other symptoms and signs involving the nervous system: Secondary | ICD-10-CM | POA: Diagnosis present

## 2021-04-22 DIAGNOSIS — R278 Other lack of coordination: Secondary | ICD-10-CM

## 2021-04-22 DIAGNOSIS — R29898 Other symptoms and signs involving the musculoskeletal system: Secondary | ICD-10-CM | POA: Diagnosis present

## 2021-04-22 DIAGNOSIS — R2681 Unsteadiness on feet: Secondary | ICD-10-CM | POA: Insufficient documentation

## 2021-04-22 DIAGNOSIS — M25611 Stiffness of right shoulder, not elsewhere classified: Secondary | ICD-10-CM | POA: Diagnosis present

## 2021-04-22 DIAGNOSIS — M25621 Stiffness of right elbow, not elsewhere classified: Secondary | ICD-10-CM | POA: Diagnosis present

## 2021-04-22 NOTE — Therapy (Signed)
[facility] [address] [place], [place], [address] Phone: [phone]   Fax:  [phone]  Physical Therapy Treatment  Patient Details  Name: Anna Bernard MRN: [id number] Date of Birth: [birth year] Referring Provider (PT): Alonza Bogus, DO   Encounter Date: 04/22/2021   PT End of Session - 04/22/21 1148     Visit Number 4    Number of Visits 13    Date for PT Re-Evaluation 06/17/21    Authorization Type UHC Medicare    PT Start Time 1102    PT Stop Time 1143    PT Time Calculation (min) 41 min    Equipment Utilized During Treatment Gait belt    Activity Tolerance Patient tolerated treatment well    Behavior During Therapy WFL for tasks assessed/performed;Flat affect             Past Medical History:  Diagnosis Date   Anemia, unspecified    Hyperlipidemia, unspecified    Hypertension    Hypothyroidism, unspecified    Pre-diabetes    Vitamin B12 deficiency     Past Surgical History:  Procedure Laterality Date   ABDOMINAL HYSTERECTOMY     GIVENS CAPSULE STUDY N/A 04/16/2018   Procedure: GIVENS CAPSULE STUDY;  Surgeon: Carol Ada, MD;  Location: [place];  Service: Endoscopy;  Laterality: N/A;   PARS PLANA VITRECTOMY Right 08/16/2020   Procedure: 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;  Surgeon: Jalene Mullet, MD;  Location: [facility];  Service: Ophthalmology;  Laterality: Right;   PARS PLANA VITRECTOMY Right 09/29/2020   Procedure: PARS PLANA VITRECTOMY WITH 25 GAUGE - ENDOCAUTRY;  Surgeon: Jalene Mullet, MD;  Location: [place];  Service: Ophthalmology;  Laterality: Right;   PHOTOCOAGULATION WITH LASER Right 08/16/2020   Procedure: PHOTOCOAGULATION WITH LASER;  Surgeon: Jalene Mullet, MD;  Location: [place];  Service: Ophthalmology;  Laterality: Right;   PHOTOCOAGULATION WITH LASER Right 09/29/2020   Procedure: PHOTOCOAGULATION WITH LASER;  Surgeon: Jalene Mullet, MD;   Location: [place];  Service: Ophthalmology;  Laterality: Right;   SHOULDER SURGERY     TONSILLECTOMY     TUBAL LIGATION      There were no vitals filed for this visit.   Subjective Assessment - 04/22/21 1104     Subjective Doing okay.  "Alive and well, that's all that matters."  Slipped out of my office chair (fell asleep in it) and husband got right up.  Didn't get hurt or black out.    Pertinent History Dx with Parkinson's disease (Dr. Carles Collet) 04/2020; PMH of HTN, DM, hypothyroidism, orthostatic hypotension    Limitations Walking    Patient Stated Goals wants to get back on track with an exercise program.    Currently in Pain? Yes    Pain Score 4     Pain Location Hip    Pain Orientation Left    Pain Descriptors / Indicators Aching    Pain Type Chronic pain    Pain Onset More than a month ago    Pain Frequency Intermittent    Aggravating Factors  walking    Pain Relieving Factors rest                               [facility] Adult PT Treatment/Exercise - 04/22/21 1106       Transfers   Transfers Sit to Stand;Stand to Sit    Sit to Stand 5: Supervision;With  upper extremity assist;From bed    Sit to Stand Details Tactile cues for sequencing;Verbal cues for sequencing;Verbal cues for technique    Sit to Stand Details (indicate cue type and reason) Cues for hand placement    Stand to Sit 5: Supervision;With upper extremity assist;To bed      Ambulation/Gait   Ambulation/Gait Yes    Ambulation/Gait Assistance 5: Supervision    Ambulation/Gait Assistance Details Cues for increased step length and foot clearance, to stay close within rollator.    Ambulation Distance (Feet) 115 Feet   150   Assistive device Rollator    Gait Pattern Step-through pattern;Decreased step length - right;Decreased step length - left;Decreased stance time - right;Decreased stance time - left;Decreased dorsiflexion - right;Decreased dorsiflexion - left;Shuffle;Poor foot clearance - left;Poor  foot clearance - right    Ambulation Surface Level;Indoor    Gait velocity 30.43 sec =1.08 ft/sec    Gait Comments Gait velocity assessed at end of session (signficantly lower than eval measure); unsure if fatigue played a factor in slowed pace.           Performed transfers, sit<>stand, from mat surface, at least 5 reps through session.   Neuro Re-education  Reviewed PWR! Moves in sitting, provided as HEP last visit.  Pt reports doing them at home at least 2 times (Reinforced to do these daily):   PWR! Up for posture, x 10 reps.  Cues for scapular retraction to avoid posterior lean in sitting.   PWR! Rock for [place], 10 reps through hips only, then 10 reps with added UE reach.  Cues for open hands and for extending elbows (pt tends to keep elbows bent)  Modified trunk rotation with reach and look over shoulder, 2 sets x 5 reps each side.   PWR! Step for step initiation, single step out and in, 10 reps (sitting on mat, feet on aerobic step)    Pt has good initial performance of HEP, benefits from cues for intensity and technique.     Balance Exercises - 04/22/21 0001       Balance Exercises: Standing   Marching Foam/compliant surface;Upper extremity assist 2;Static;10 reps;Limitations    Marching Limitations Cues for "stomping" to increase intensity of lifting each foot.    Other Standing Exercises Wide BOS lateral weightshifting x 10 reps    Other Standing Exercises Comments Lateral step and weigthshift x 5 reps each side, then forward step and weigthshift 2 sets x 5 reps, standing at chair for UE support.               PT Education - 04/22/21 1153     Education Details Pt had fall out of office chair with wheels-thinks she fell asleep and slipped out of chair.  Educated pt in more appropriate/safe chair choices for improved safety/decreased fall risk.    Person(s) Educated Patient    Methods Explanation    Comprehension Verbalized understanding               PT Short Term Goals - 04/22/21 1152       PT SHORT TERM GOAL #1   Title Pt will perform initial HEP with family supervision, for improved balance, stregnth, transfers, and gait.  ALL STGS DUE 04/19/21    Baseline 04/22/21:  met for initiatl HEP    Time 4    Period Weeks    Status Achieved    Target Date 04/19/21      PT SHORT TERM GOAL #2   Title Pt  will improve gait speed with rollator to at least 1.95 ft/sec in order to demo improved gait effieicency.    Baseline 04/22/21:  gait velocity 1.07 ft/sec    Time 4    Period Weeks    Status Not Met               PT Long Term Goals - 03/22/21 1704       PT LONG TERM GOAL #1   Title Pt will perform progression of HEP and walking program, including verbalizing plans for optimal fitness plan upon d/c from PT.  ALL LTGS DUE 05/17/21.    Time 8    Period Weeks    Status New    Target Date 05/17/21      PT LONG TERM GOAL #2   Title Pt will improve 5x sit<>stand in less than or equal to 18 seconds for improved transfer efficiency and functional strength.    Baseline 21.50 seconds    Time 8    Period Weeks    Status New      PT LONG TERM GOAL #3   Title Pt will improve gait velocity to at least 2.3 ft/sec for improved gait efficiency and safety.    Baseline 1.86 ft/sec with rollator.    Time 8    Period Weeks    Status New      PT LONG TERM GOAL #4   Title Pt will improve 2MWT distance with rollator to at least 210' feet for improved gait efficiency and endurance.    Baseline 185' with rollator    Time 8    Period Weeks    Status New      PT LONG TERM GOAL #5   Title Pt and pt's husband will verbalize understanding of local PD resources.    Time 8    Period Weeks    Status New                   Plan - 04/22/21 1149     Clinical Impression Statement Assessed STGs this visit (due to date for STGs); however, pt has only been seen 3 visits since PT eval due to scheduling conflicts/delay in starting  treatments.  STG 1 met for initial seated PWR! Moves exercises.  STG 2 not met for gait velcoity; gait velocity is signifcantly slower than at PT eval.  Unsure if this is related to fatigue at end of PT session when measure was taken.  Pt continues to demonstrate overall slowed mobility with transfers and with gait, and needs cues for initial rocking upon standing to assist with initiating gait.  Pt will continue to benefit from skilled PT to address strength, balance and gait for overall improved mobility, towards LTGs.    Personal Factors and Comorbidities Comorbidity 3+    Comorbidities PMH:  HTN, DM, hypothyroidism, orthostatic hypotension    Examination-Activity Limitations Locomotion Level;Stand;Transfers;Stairs    Examination-Participation Restrictions Community Activity;Other   Plans for travel in retirement   Stability/Clinical Decision Making Evolving/Moderate complexity    Rehab Potential Good    PT Frequency 2x / week   12 visits over 12 weeks   PT Duration 12 weeks   12 visits over 12 weeks   PT Treatment/Interventions ADLs/Self Care Home Management;DME Instruction;Neuromuscular re-education;Balance training;Therapeutic exercise;Therapeutic activities;Functional mobility training;Gait training;Patient/family education;Manual techniques;Stair training;Orthotic Fit/Training    PT Next Visit Plan Review and add to initial HEP as appropriate; continue gait training with rollator - with foot clearance and incr step  length, weigthshfiting.    Consulted and Agree with Plan of Care Patient             Patient will benefit from skilled therapeutic intervention in order to improve the following deficits and impairments:  Abnormal gait, Difficulty walking, Decreased balance, Decreased mobility, Decreased strength, Postural dysfunction, Decreased activity tolerance, Decreased endurance, Decreased coordination  Visit Diagnosis: Other abnormalities of gait and mobility  Unsteadiness on  feet     Problem List Patient Active Problem List   Diagnosis Date Noted   Pain due to onychomycosis of toenails of both feet 06/17/2020   Parkinson's disease ([place]) 04/23/2020   Leg swelling 11/26/2019   Chronic venous insufficiency 11/26/2019   Vitamin B12 deficiency 02/06/2018   Sepsis secondary to UTI ([place]) 01/29/2018   Microcytic anemia 01/29/2018   Renal insufficiency 01/29/2018   Pure hypercholesterolemia 12/24/2014   Diabetes mellitus type 2, controlled, without complications ([place]) 71/42/3200   Bruit of right carotid artery 12/24/2014   Thyroid disease 07/06/2014   Severe obesity (BMI >= 40) ([place]) 07/06/2014   Essential hypertension 07/06/2014    Nona Gracey W. 04/22/2021, 11:55 AM Frazier Butt., PT   [facility] [address] [place], [place], [address] Phone: [phone]   Fax:  [phone]  Name: VINITA PRENTISS MRN: [id number] Date of Birth: 02/03/1951

## 2021-04-22 NOTE — Therapy (Signed)
[facility] [address] [place], [place], [address] Phone: [phone]   Fax:  [phone]  Occupational Therapy Treatment  Patient Details  Name: BRANDEY VANDALEN MRN: [id number] Date of Birth: May 03, 1951 Referring Provider (OT): Dr. Arbutus Leas   Encounter Date: 04/22/2021   OT End of Session - 04/22/21 1023     Visit Number 2    Number of Visits 25    Date for OT Re-Evaluation 07/09/21    Authorization Type [employer] Medicare    Authorization - Visit Number 2    Authorization - Number of Visits 10    Progress Note Due on Visit 10    OT Start Time 1020    OT Stop Time 1058    OT Time Calculation (min) 38 min             Past Medical History:  Diagnosis Date   Anemia, unspecified    Hyperlipidemia, unspecified    Hypertension    Hypothyroidism, unspecified    Pre-diabetes    Vitamin B12 deficiency     Past Surgical History:  Procedure Laterality Date   ABDOMINAL HYSTERECTOMY     GIVENS CAPSULE STUDY N/A 04/16/2018   Procedure: GIVENS CAPSULE STUDY;  Surgeon: Jeani Hawking, MD;  Location: [facility] 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;  Surgeon: Carmela Rima, MD;  Location: [facility] OR;  Service: Ophthalmology;  Laterality: Right;   PARS PLANA VITRECTOMY Right 09/29/2020   Procedure: PARS PLANA VITRECTOMY WITH 25 GAUGE - ENDOCAUTRY;  Surgeon: Carmela Rima, MD;  Location: [facility] OR;  Service: Ophthalmology;  Laterality: Right;   PHOTOCOAGULATION WITH LASER Right 08/16/2020   Procedure: PHOTOCOAGULATION WITH LASER;  Surgeon: Carmela Rima, MD;  Location[facility] OR;  Service: Ophthalmology;  Laterality: Right;   PHOTOCOAGULATION WITH LASER Right 09/29/2020   Procedure: PHOTOCOAGULATION WITH LASER;  Surgeon: Carmela Rima, MD;  Location: [facility] OR;  Service: Ophthalmology;  Laterality: Right;    SHOULDER SURGERY     TONSILLECTOMY     TUBAL LIGATION      There were no vitals filed for this visit.   Subjective Assessment - 04/22/21 1048     Subjective  hip pain    Pertinent History Pt is a 70 y.o. female with recent Parkinson's disease diagnosis.  Pt with PMH that includes:anemia, hyperlipidemia, orthostatic hypotension, HTN, hypothyroidism, Vitamin B12 deficiency, hx of R RTC surgery, leg swelling, arthritis (per pt), obesity    Patient Stated Goals get better    Currently in Pain? Yes    Pain Score 3     Pain Location Hip    Pain Orientation Left    Pain Descriptors / Indicators Aching    Pain Type Chronic pain    Pain Onset More than a month ago    Pain Frequency Intermittent    Aggravating Factors  walking    Pain Relieving Factors rest              Treatment: Dynamic functional reach with trunk rotation, to place / remove large pegs in pegboard, min difficulty/ v.c . Pt fatigued quickly requiring rest break. Pt was shown how to use PWR! Supine for bed mobility, she returned demonstration, min v.c and assist                    OT Education - 04/22/21 1046     Education  Details PWR! moves supine 10 reps each, PWR ! step was modified for hip lift separate from stepping    Person(s) Educated Patient    Methods Explanation;Demonstration;Verbal cues;Handout    Comprehension Verbalized understanding;Returned demonstration;Verbal cues required              OT Short Term Goals - 04/16/21 1438       OT SHORT TERM GOAL #1   Title Pt will be independent with PD-specific HEP--check STGs 05/14/21    Time 4    Period Weeks    Status New    Target Date 05/14/21      OT SHORT TERM GOAL #2   Title Pt will verbalize understanding of adapted strategies to maximize safety and I with ADLs/ IADLs .    Time 4    Period Weeks    Status New      OT SHORT TERM GOAL #3   Title Pt will improve R hand coordination for ADLs as shown by improving time on  9-hole peg test by at least 3 secs    Baseline 44.34    Time 4    Period Weeks    Status New      OT SHORT TERM GOAL #4   Title Pt willdemonstrate improved ease with dressing as evidenced by  performing  PPT#4 donning/ doffing 18 secs or less    Time 4    Period Weeks    Status New               OT Long Term Goals - 04/16/21 1444       OT LONG TERM GOAL #1   Title Pt will verbalize understanding of ways to prevent future PD related complications and PD community resources.    Time 12    Period Weeks    Status New    Target Date 07/09/21      OT LONG TERM GOAL #2   Title Pt will verbalize  understanding of ways to keep thinking skills sharp.    Time 12    Period Weeks    Status New      OT LONG TERM GOAL #3   Title Pt will retrieve a lightweight object from overhead shelf with RUE with -10 elbow extension.    Time 12    Period Weeks    Status New      OT LONG TERM GOAL #4   Title Pt will perform LB dressing consistently mod I.    Time 12    Period Weeks    Status New                   Plan - 04/22/21 1035     Clinical Impression Statement Pt is progressing toward goals. She demonstrates understanding of PWR! moves in supine however she may benefit from review.    OT Occupational Profile and History Problem Focused Assessment - Including review of records relating to presenting problem    Occupational performance deficits (Please refer to evaluation for details): ADL's;IADL's;Leisure;Social Participation    Body Structure / Function / Physical Skills ADL;UE functional use;Balance;Flexibility;FMC;Gait;ROM;Coordination;GMC;Decreased knowledge of precautions;Decreased knowledge of use of DME;IADL;Dexterity;Strength;Mobility    Rehab Potential Good    Clinical Decision Making Limited treatment options, no task modification necessary    Comorbidities Affecting Occupational Performance: May have comorbidities impacting occupational performance     Modification or Assistance to Complete Evaluation  No modification of tasks or assist necessary to complete eval  OT Frequency 2x / week   anticipate d/c after 8 weeks   OT Duration 12 weeks    OT Treatment/Interventions Self-care/ADL training;Energy conservation;Patient/family education;DME and/or AE instruction;Aquatic Therapy;Balance training;Fluidtherapy;Building services engineer;Therapeutic exercise;Moist Heat;Manual Therapy;Therapeutic activities;Neuromuscular education    Plan initate HEP coordination, PWR! seated , ADL strategies    Consulted and Agree with Plan of Care Patient             Patient will benefit from skilled therapeutic intervention in order to improve the following deficits and impairments:   Body Structure / Function / Physical Skills: ADL, UE functional use, Balance, Flexibility, FMC, Gait, ROM, Coordination, GMC, Decreased knowledge of precautions, Decreased knowledge of use of DME, IADL, Dexterity, Strength, Mobility       Visit Diagnosis: Muscle weakness (generalized)  Other symptoms and signs involving the nervous system  Abnormal posture  Other symptoms and signs involving the musculoskeletal system  Stiffness of right elbow, not elsewhere classified  Stiffness of right shoulder, not elsewhere classified  Other lack of coordination    Problem List Patient Active Problem List   Diagnosis Date Noted   Pain due to onychomycosis of toenails of both feet 06/17/2020   Parkinson's disease (HCC) 04/23/2020   Leg swelling 11/26/2019   Chronic venous insufficiency 11/26/2019   Vitamin B12 deficiency 02/06/2018   Sepsis secondary to UTI (HCC) 01/29/2018   Microcytic anemia 01/29/2018   Renal insufficiency 01/29/2018   Pure hypercholesterolemia 12/24/2014   Diabetes mellitus type 2, controlled, without complications (HCC) 12/24/2014   Bruit of right carotid artery 12/24/2014   Thyroid disease 07/06/2014   Severe obesity (BMI >= 40) (HCC)  07/06/2014   Essential hypertension 07/06/2014    Keighley Deckman 04/22/2021, 10:50 AM  [facility] [address] [place], [place], [address] Phone: [phone]   Fax:  [phone]  Name: DAPHYNE MIGUEZ MRN: [id number] Date of Birth: 1950-11-29

## 2021-04-23 ENCOUNTER — Encounter: Payer: Self-pay | Admitting: Physical Therapy

## 2021-04-23 ENCOUNTER — Ambulatory Visit: Payer: Medicare Other | Admitting: Occupational Therapy

## 2021-04-23 ENCOUNTER — Ambulatory Visit: Payer: Medicare Other | Admitting: Physical Therapy

## 2021-04-23 DIAGNOSIS — M6281 Muscle weakness (generalized): Secondary | ICD-10-CM

## 2021-04-23 DIAGNOSIS — M25611 Stiffness of right shoulder, not elsewhere classified: Secondary | ICD-10-CM

## 2021-04-23 DIAGNOSIS — M25621 Stiffness of right elbow, not elsewhere classified: Secondary | ICD-10-CM

## 2021-04-23 DIAGNOSIS — R293 Abnormal posture: Secondary | ICD-10-CM

## 2021-04-23 DIAGNOSIS — R2681 Unsteadiness on feet: Secondary | ICD-10-CM

## 2021-04-23 DIAGNOSIS — R29898 Other symptoms and signs involving the musculoskeletal system: Secondary | ICD-10-CM

## 2021-04-23 DIAGNOSIS — R2689 Other abnormalities of gait and mobility: Secondary | ICD-10-CM | POA: Diagnosis not present

## 2021-04-23 DIAGNOSIS — R278 Other lack of coordination: Secondary | ICD-10-CM

## 2021-04-23 DIAGNOSIS — R29818 Other symptoms and signs involving the nervous system: Secondary | ICD-10-CM

## 2021-04-23 NOTE — Patient Instructions (Signed)
Bag Exercises:  Small trash bag or produce bag works best.  For all exercises, sit with big posture (sit up tall with head up) and use big movements. Perform the following exercises 1 times per day.  Hold end of bag in one hand. Stretch fingers out big to draw the entire bag into your palm. Repeat 2 times with each hand. Hold bag in one hand. Stretch both arms/hands out to the side as big as you can. Then, pass bag from one hand to the other IN FRONT of you. Stretch arms back out big after each pass. Repeat 10 times. Hold bag in one hand. Stretch both arms/hands out to the side as big as you can. Then, pass bag from one hand to the other BEHIND you. Stretch arms back out big after each pass. Repeat 10 times. Hold bag in both hands in front of you with hands/arms shoulder length apart. Move bag behind your head. Repeat 10 times.

## 2021-04-23 NOTE — Therapy (Signed)
[place] [address] [place], [place], [address] Phone: [phone]   Fax:  [phone]  Physical Therapy Treatment  Patient Details  Name: Anna Bernard MRN: [id number] Date of Birth: 18-Mar-1951 Referring Provider (PT): Alonza Bogus, DO   Encounter Date: 04/23/2021   PT End of Session - 04/23/21 1411     Visit Number 5    Number of Visits 13    Date for PT Re-Evaluation 06/17/21    Authorization Type UHC Medicare    PT Start Time 1016    PT Stop Time 1058    PT Time Calculation (min) 42 min    Equipment Utilized During Treatment Gait belt    Activity Tolerance Patient tolerated treatment well    Behavior During Therapy WFL for tasks assessed/performed;Flat affect             Past Medical History:  Diagnosis Date   Anemia, unspecified    Hyperlipidemia, unspecified    Hypertension    Hypothyroidism, unspecified    Pre-diabetes    Vitamin B12 deficiency     Past Surgical History:  Procedure Laterality Date   ABDOMINAL HYSTERECTOMY     GIVENS CAPSULE STUDY N/A 04/16/2018   Procedure: GIVENS CAPSULE STUDY;  Surgeon: Carol Ada, MD;  Location: [facility] [place];  Service: Endoscopy;  Laterality: N/A;   PARS PLANA VITRECTOMY Right 08/16/2020   Procedure: 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;  Service: Ophthalmology;  Laterality: Right;   PHOTOCOAGULATION WITH LASER Right 09/29/2020   Procedure: PHOTOCOAGULATION WITH LASER;  Surgeon: Jalene Mullet, MD;   Location: [place];  Service: Ophthalmology;  Laterality: Right;   SHOULDER SURGERY     TONSILLECTOMY     TUBAL LIGATION      There were no vitals filed for this visit.   Subjective Assessment - 04/23/21 1022     Subjective No changes since last visit.    Pertinent History Dx with Parkinson's disease (Dr. Carles Collet) 04/2020; PMH of HTN, DM, hypothyroidism, orthostatic hypotension    Limitations Walking    Patient Stated Goals wants to get back on track with an exercise program.    Currently in Pain? Yes    Pain Score 4     Pain Location Hip    Pain Orientation Left    Pain Descriptors / Indicators Aching    Pain Type Chronic pain    Pain Onset More than a month ago    Pain Frequency Intermittent    Aggravating Factors  walking    Pain Relieving Factors rest, medication                               [facility] Adult PT Treatment/Exercise - 04/23/21 0001       Transfers   Transfers Sit to Stand;Stand to Sit    Sit to Stand 5: Supervision;With upper extremity assist;From bed    Sit to Stand Details Tactile cues for sequencing;Verbal cues for sequencing;Verbal cues for technique    Stand to Sit  5: Supervision;With upper extremity assist;To bed    Number of Reps 2 sets;Other reps (comment)   5 reps     Ambulation/Gait   Ambulation/Gait Yes    Ambulation/Gait Assistance 5: Supervision    Ambulation/Gait Assistance Details Cues for posture, increased step length, foot clearance RLE    Ambulation Distance (Feet) 115 Feet   x 2   Assistive device Rollator    Gait Pattern Step-through pattern;Decreased step length - right;Decreased step length - left;Decreased stance time - right;Decreased stance time - left;Decreased dorsiflexion - right;Decreased dorsiflexion - left;Shuffle;Poor foot clearance - left;Poor foot clearance - right    Ambulation Surface Level;Indoor    Gait velocity 17.62 sec = 1.86 ft/sec      Exercises   Exercises Other Exercises    Other Exercises   Pursed lip breathing between exercises, 5 reps, x 2 sets with cues for technique to slow breathing.      Knee/Hip Exercises: Seated   Long Arc Quad Right;Left;10 reps    Knee/Hip Flexion Marching in place x 10 reps    Other Seated Knee/Hip Exercises Heel/toe raises x 10 reps    Other Seated Knee/Hip Exercises Leg kick/step taps x 10 reps                 Balance Exercises - 04/23/21 0001       Balance Exercises: Standing   Other Standing Exercises Wide BOS lateral weightshifting x 10 reps; stagger stance foot position with anterior/posterior weightshift x 10 reps    Other Standing Exercises Comments Lateral step and weigthshift x 5 reps, 2 sets each side, standing at counter for UE support.               PT Education - 04/23/21 1410     Education Details Additions to HEP this visit; attempted to only add seated, but pt requests to do standing weightshifting as well.  Explained rationale for seated exercises (as warm-up for BLEs after prolonged sitting) and standing exercises (rocking for improved weightshifting)    Person(s) Educated Patient    Methods Explanation;Demonstration;Handout    Comprehension Verbalized understanding;Returned demonstration;Need further instruction              PT Short Term Goals - 04/23/21 1411       PT SHORT TERM GOAL #1   Title Pt will perform initial HEP with family supervision, for improved balance, stregnth, transfers, and gait.  ALL STGS DUE 04/19/21    Baseline 04/22/21:  met for initiatl HEP    Time 4    Period Weeks    Status Achieved    Target Date 04/19/21      PT SHORT TERM GOAL #2   Title Pt will improve gait speed with rollator to at least 1.95 ft/sec in order to demo improved gait effieicency.    Baseline 04/22/21:  gait velocity 1.07 ft/sec; 1.86 ft/sec 04/23/21    Time 4    Period Weeks    Status Not Met               PT Long Term Goals - 03/22/21 1704       PT LONG TERM GOAL #1   Title Pt will perform  progression of HEP and walking program, including verbalizing plans for optimal fitness plan upon d/c from PT.  ALL LTGS DUE 05/17/21.    Time 8    Period Weeks    Status New    Target Date 05/17/21  PT LONG TERM GOAL #2   Title Pt will improve 5x sit<>stand in less than or equal to 18 seconds for improved transfer efficiency and functional strength.    Baseline 21.50 seconds    Time 8    Period Weeks    Status New      PT LONG TERM GOAL #3   Title Pt will improve gait velocity to at least 2.3 ft/sec for improved gait efficiency and safety.    Baseline 1.86 ft/sec with rollator.    Time 8    Period Weeks    Status New      PT LONG TERM GOAL #4   Title Pt will improve 2MWT distance with rollator to at least 210' feet for improved gait efficiency and endurance.    Baseline 185' with rollator    Time 8    Period Weeks    Status New      PT LONG TERM GOAL #5   Title Pt and pt's husband will verbalize understanding of local PD resources.    Time 8    Period Weeks    Status New                   Plan - 04/23/21 1412     Clinical Impression Statement Looked again at gait velocity at beginning of visit, with gait velocity 1.86 ft/sec today (more similar to eval measure).  Focused skilled PT session today on seated leg exercises as well as standing weigthshifting to assist with motions to help with foot clearance with gait.  Added HEP to address.  Pt will continue to benefit from skilled PT towards LTGs for improved mobility and decreased fall risk.    Personal Factors and Comorbidities Comorbidity 3+    Comorbidities PMH:  HTN, DM, hypothyroidism, orthostatic hypotension    Examination-Activity Limitations Locomotion Level;Stand;Transfers;Stairs    Examination-Participation Restrictions Community Activity;Other   Plans for travel in retirement   Stability/Clinical Decision Making Evolving/Moderate complexity    Rehab Potential Good    PT Frequency 2x / week   12 visits  over 12 weeks   PT Duration 12 weeks   12 visits over 12 weeks   PT Treatment/Interventions ADLs/Self Care Home Management;DME Instruction;Neuromuscular re-education;Balance training;Therapeutic exercise;Therapeutic activities;Functional mobility training;Gait training;Patient/family education;Manual techniques;Stair training;Orthotic Fit/Training    PT Next Visit Plan Review additions to HEP; continue strength and balance; continue gait training with rollator - with foot clearance and incr step length, weigthshfiting.    Consulted and Agree with Plan of Care Patient             Patient will benefit from skilled therapeutic intervention in order to improve the following deficits and impairments:  Abnormal gait, Difficulty walking, Decreased balance, Decreased mobility, Decreased strength, Postural dysfunction, Decreased activity tolerance, Decreased endurance, Decreased coordination  Visit Diagnosis: Muscle weakness (generalized)  Unsteadiness on feet  Other abnormalities of gait and mobility     Problem List Patient Active Problem List   Diagnosis Date Noted   Pain due to onychomycosis of toenails of both feet 06/17/2020   Parkinson's disease ([place]) 04/23/2020   Leg swelling 11/26/2019   Chronic venous insufficiency 11/26/2019   Vitamin B12 deficiency 02/06/2018   Sepsis secondary to UTI ([place]) 01/29/2018   Microcytic anemia 01/29/2018   Renal insufficiency 01/29/2018   Pure hypercholesterolemia 12/24/2014   Diabetes mellitus type 2, controlled, without complications ([place]) 99/24/2683   Bruit of right carotid artery 12/24/2014   Thyroid disease 07/06/2014   Severe obesity (  BMI >= 40) ([place]) 07/06/2014   Essential hypertension 07/06/2014    Jelitza Manninen W. 04/23/2021, 2:16 PM Frazier Butt., PT  [place] [address] [place], [place], [address] Phone: [phone]   Fax:  [phone]  Name: LEANAH KOLANDER MRN: [id number] Date of Birth: Aug 28, 1951

## 2021-04-23 NOTE — Patient Instructions (Signed)
Access Code: [email or web address] URL: [email or web address] Date: 04/23/2021 Prepared by: Lonia Blood  Exercises Seated March - 1 x daily - 5 x weekly - 1-2 sets - 10 reps Seated Long Arc Quad - 1 x daily - 7 x weekly - 1-2 sets - 10 reps Seated Heel Toe Raises - 1 x daily - 7 x weekly - 1-2 sets - 10 reps Side to side weightshift - 1 x daily - 5 x weekly - 1 sets - 10 reps Staggered Stance Forward Backward Weight Shift with Counter Support - 1 x daily - 5 x weekly - 1 sets - 10 reps

## 2021-04-23 NOTE — Therapy (Signed)
[facility] [address] [place], [place], [address] Phone: [phone]   Fax:  [phone]  Occupational Therapy Treatment  Patient Details  Name: Anna Bernard MRN: [id number] Date of Birth: May 26, 1951 Referring Provider (OT): Dr. Arbutus Leas   Encounter Date: 04/23/2021   OT End of Session - 04/23/21 1140     Visit Number 3    Number of Visits 25    Date for OT Re-Evaluation 07/09/21    Authorization Type [employer] Medicare    Authorization - Visit Number 3    Authorization - Number of Visits 10    Progress Note Due on Visit 10    OT Start Time 1103    OT Stop Time 1141    OT Time Calculation (min) 38 min    Behavior During Therapy WFL for tasks assessed/performed;Flat affect             Past Medical History:  Diagnosis Date   Anemia, unspecified    Hyperlipidemia, unspecified    Hypertension    Hypothyroidism, unspecified    Pre-diabetes    Vitamin B12 deficiency     Past Surgical History:  Procedure Laterality Date   ABDOMINAL HYSTERECTOMY     GIVENS CAPSULE STUDY N/A 04/16/2018   Procedure: GIVENS CAPSULE STUDY;  Surgeon: Jeani Hawking, MD;  Location: [facility] 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;  Surgeon: Carmela Rima, MD;  Location: [facility] OR;  Service: Ophthalmology;  Laterality: Right;   PARS PLANA VITRECTOMY Right 09/29/2020   Procedure: PARS PLANA VITRECTOMY WITH 25 GAUGE - ENDOCAUTRY;  Surgeon: Carmela Rima, MD;  Location: [facility] OR;  Service: Ophthalmology;  Laterality: Right;   PHOTOCOAGULATION WITH LASER Right 08/16/2020   Procedure: PHOTOCOAGULATION WITH LASER;  Surgeon: Carmela Rima, MD;  Location: [facility] OR;  Service: Ophthalmology;  Laterality: Right;   PHOTOCOAGULATION WITH LASER Right 09/29/2020   Procedure: PHOTOCOAGULATION WITH LASER;  Surgeon: Carmela Rima, MD;   Location: [facility] OR;  Service: Ophthalmology;  Laterality: Right;   SHOULDER SURGERY     TONSILLECTOMY     TUBAL LIGATION      There were no vitals filed for this visit.   Subjective Assessment - 04/23/21 1442     Subjective  Pt reports her hip pain is better after PT    Pertinent History Pt is a 70 y.o. female with recent Parkinson's disease diagnosis.  Pt with PMH that includes:anemia, hyperlipidemia, orthostatic hypotension, HTN, hypothyroidism, Vitamin B12 deficiency, hx of R RTC surgery, leg swelling, arthritis (per pt), obesity    Patient Stated Goals get better    Currently in Pain? Yes    Pain Score 2     Pain Location Hip    Pain Orientation Left    Pain Descriptors / Indicators Aching    Pain Type Chronic pain    Pain Onset More than a month ago    Pain Frequency Intermittent    Aggravating Factors  walking    Pain Relieving Factors rest, meds                             Treatment: seated Dynamic functional reaching with trunk rotation, min v.c for larger amplitude.     OT Treatment/ Education - 04/23/21 1138     Education Details PWR! moves supine 10 reps each, PWR ! step  was modified for hip lift separate from stepping- reveiwed 10 reps each,  Bag exercises for simulated ADLS(donning socks,donning shirt, tucking in shirt) was issued min v.c for performance and amplitude, hanout issued -see    Person(s) Educated Patient    Methods Explanation;Demonstration;Verbal cues;Handout    Comprehension Verbalized understanding;Returned demonstration;Verbal cues required              OT Short Term Goals - 04/16/21 1438       OT SHORT TERM GOAL #1   Title Pt will be independent with PD-specific HEP--check STGs 05/14/21    Time 4    Period Weeks    Status New    Target Date 05/14/21      OT SHORT TERM GOAL #2   Title Pt will verbalize understanding of adapted strategies to maximize safety and I with ADLs/ IADLs .    Time 4    Period Weeks     Status New      OT SHORT TERM GOAL #3   Title Pt will improve R hand coordination for ADLs as shown by improving time on 9-hole peg test by at least 3 secs    Baseline 44.34    Time 4    Period Weeks    Status New      OT SHORT TERM GOAL #4   Title Pt willdemonstrate improved ease with dressing as evidenced by  performing  PPT#4 donning/ doffing 18 secs or less    Time 4    Period Weeks    Status New               OT Long Term Goals - 04/16/21 1444       OT LONG TERM GOAL #1   Title Pt will verbalize understanding of ways to prevent future PD related complications and PD community resources.    Time 12    Period Weeks    Status New    Target Date 07/09/21      OT LONG TERM GOAL #2   Title Pt will verbalize  understanding of ways to keep thinking skills sharp.    Time 12    Period Weeks    Status New      OT LONG TERM GOAL #3   Title Pt will retrieve a lightweight object from overhead shelf with RUE with -10 elbow extension.    Time 12    Period Weeks    Status New      OT LONG TERM GOAL #4   Title Pt will perform LB dressing consistently mod I.    Time 12    Period Weeks    Status New                   Plan - 04/23/21 1217     Clinical Impression Statement Pt is progressing toward goals. She demonstrates understanding of bag exercises for simulated ADLs.    OT Occupational Profile and History Problem Focused Assessment - Including review of records relating to presenting problem    Occupational performance deficits (Please refer to evaluation for details): ADL's;IADL's;Leisure;Social Participation    Body Structure / Function / Physical Skills ADL;UE functional use;Balance;Flexibility;FMC;Gait;ROM;Coordination;GMC;Decreased knowledge of precautions;Decreased knowledge of use of DME;IADL;Dexterity;Strength;Mobility    Rehab Potential Good    OT Frequency 2x / week    OT Duration 12 weeks    OT Treatment/Interventions Self-care/ADL training;Energy  conservation;Patient/family education;DME and/or AE instruction;Aquatic Therapy;Balance training;Fluidtherapy;Building services engineer;Therapeutic exercise;Moist Heat;Manual Therapy;Therapeutic activities;Neuromuscular education  Plan initate HEP coordination HEP , ADL strategies    Consulted and Agree with Plan of Care Patient             Patient will benefit from skilled therapeutic intervention in order to improve the following deficits and impairments:   Body Structure / Function / Physical Skills: ADL, UE functional use, Balance, Flexibility, FMC, Gait, ROM, Coordination, GMC, Decreased knowledge of precautions, Decreased knowledge of use of DME, IADL, Dexterity, Strength, Mobility       Visit Diagnosis: Muscle weakness (generalized)  Other symptoms and signs involving the nervous system  Abnormal posture  Other symptoms and signs involving the musculoskeletal system  Stiffness of right elbow, not elsewhere classified  Stiffness of right shoulder, not elsewhere classified  Other lack of coordination    Problem List Patient Active Problem List   Diagnosis Date Noted   Pain due to onychomycosis of toenails of both feet 06/17/2020   Parkinson's disease (HCC) 04/23/2020   Leg swelling 11/26/2019   Chronic venous insufficiency 11/26/2019   Vitamin B12 deficiency 02/06/2018   Sepsis secondary to UTI (HCC) 01/29/2018   Microcytic anemia 01/29/2018   Renal insufficiency 01/29/2018   Pure hypercholesterolemia 12/24/2014   Diabetes mellitus type 2, controlled, without complications (HCC) 12/24/2014   Bruit of right carotid artery 12/24/2014   Thyroid disease 07/06/2014   Severe obesity (BMI >= 40) (HCC) 07/06/2014   Essential hypertension 07/06/2014    Izreal Kock 04/23/2021, 2:44 PM  [facility] [address] [place], [place], [address] Phone: [phone]   Fax:  [phone]  Name: Anna Bernard MRN: [id number] Date of Birth: Jun 24, 1951

## 2021-04-27 ENCOUNTER — Ambulatory Visit: Payer: Medicare Other | Admitting: Physical Therapy

## 2021-04-27 ENCOUNTER — Other Ambulatory Visit: Payer: Self-pay

## 2021-04-27 ENCOUNTER — Encounter: Payer: Self-pay | Admitting: Physical Therapy

## 2021-04-27 ENCOUNTER — Ambulatory Visit: Payer: Medicare Other | Admitting: Occupational Therapy

## 2021-04-27 DIAGNOSIS — R293 Abnormal posture: Secondary | ICD-10-CM

## 2021-04-27 DIAGNOSIS — M25611 Stiffness of right shoulder, not elsewhere classified: Secondary | ICD-10-CM

## 2021-04-27 DIAGNOSIS — M25621 Stiffness of right elbow, not elsewhere classified: Secondary | ICD-10-CM

## 2021-04-27 DIAGNOSIS — R2689 Other abnormalities of gait and mobility: Secondary | ICD-10-CM | POA: Diagnosis not present

## 2021-04-27 DIAGNOSIS — R29898 Other symptoms and signs involving the musculoskeletal system: Secondary | ICD-10-CM

## 2021-04-27 DIAGNOSIS — R278 Other lack of coordination: Secondary | ICD-10-CM

## 2021-04-27 DIAGNOSIS — R2681 Unsteadiness on feet: Secondary | ICD-10-CM

## 2021-04-27 DIAGNOSIS — R29818 Other symptoms and signs involving the nervous system: Secondary | ICD-10-CM

## 2021-04-27 DIAGNOSIS — M6281 Muscle weakness (generalized): Secondary | ICD-10-CM

## 2021-04-27 NOTE — Therapy (Addendum)
[facility] [address] [place], [place], [address] Phone: [phone]   Fax:  [phone]  Physical Therapy Treatment  Patient Details  Name: Anna Bernard MRN: [id number] Date of Birth: 04-05-1951 Referring Provider (PT): Alonza Bogus, DO   Encounter Date: 04/27/2021   PT End of Session - 04/27/21 1529     Visit Number 6    Number of Visits 13    Date for PT Re-Evaluation 06/17/21    Authorization Type UHC Medicare    PT Start Time 1446    PT Stop Time 1528    PT Time Calculation (min) 42 min    Equipment Utilized During Treatment Gait belt    Activity Tolerance Patient tolerated treatment well    Behavior During Therapy WFL for tasks assessed/performed;Flat affect             Past Medical History:  Diagnosis Date   Anemia, unspecified    Hyperlipidemia, unspecified    Hypertension    Hypothyroidism, unspecified    Pre-diabetes    Vitamin B12 deficiency     Past Surgical History:  Procedure Laterality Date   ABDOMINAL HYSTERECTOMY     GIVENS CAPSULE STUDY N/A 04/16/2018   Procedure: GIVENS CAPSULE STUDY;  Surgeon: Carol Ada, MD;  Location: [facility];  Service: Endoscopy;  Laterality: N/A;   PARS PLANA VITRECTOMY Right 08/16/2020   Procedure: 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;  Surgeon: Jalene Mullet, MD;  Location: [facility];  Service: Ophthalmology;  Laterality: Right;   PARS PLANA VITRECTOMY Right 09/29/2020   Procedure: PARS PLANA VITRECTOMY WITH 25 GAUGE - ENDOCAUTRY;  Surgeon: Jalene Mullet, MD;  Location: [facility];  Service: Ophthalmology;  Laterality: Right;   PHOTOCOAGULATION WITH LASER Right 08/16/2020   Procedure: PHOTOCOAGULATION WITH LASER;  Surgeon: Jalene Mullet, MD;  Location: [facility];  Service: Ophthalmology;  Laterality: Right;   PHOTOCOAGULATION WITH LASER Right 09/29/2020   Procedure: PHOTOCOAGULATION WITH LASER;  Surgeon: Jalene Mullet, MD;   Location: [facility];  Service: Ophthalmology;  Laterality: Right;   SHOULDER SURGERY     TONSILLECTOMY     TUBAL LIGATION      There were no vitals filed for this visit.   Subjective Assessment - 04/27/21 1447     Subjective No changes, no falls. "I'm alive"    Pertinent History Dx with Parkinson's disease (Dr. Carles Collet) 04/2020; PMH of HTN, DM, hypothyroidism, orthostatic hypotension    Limitations Walking    Patient Stated Goals wants to get back on track with an exercise program.    Currently in Pain? Yes    Pain Score 4     Pain Location Hip    Pain Orientation Left    Pain Descriptors / Indicators Sore    Pain Type Chronic pain    Pain Onset More than a month ago    Aggravating Factors  nothing    Pain Relieving Factors taking aleve                               [facility] Adult PT Treatment/Exercise - 04/27/21 1504       Transfers   Transfers Sit to Stand;Stand to Sit    Sit to Stand 5: Supervision;With upper extremity assist;From bed    Sit to Stand Details Tactile cues for sequencing;Verbal cues for sequencing;Verbal cues for technique    Stand to Sit 5: Supervision;With upper extremity assist;To  bed    Number of Reps 2 sets   5 reps   Comments cues for incr forward lean and posture in standing      Ambulation/Gait   Ambulation/Gait Yes    Ambulation/Gait Assistance 5: Supervision    Ambulation/Gait Assistance Details lowered height of pt's rollator as originally it was too high and pt with incr forward flexed posture and incr elbow flexion, afterwards pt reporting and demonstrating that she felt taller during gait and not as hunched over. cues for incr step length B and foot clearance with RLE    Ambulation Distance (Feet) 115 Feet   x2   Assistive device Rollator    Gait Pattern Step-through pattern;Decreased step length - right;Decreased step length - left;Decreased stance time - right;Decreased stance time - left;Decreased dorsiflexion - right;Decreased  dorsiflexion - left;Shuffle;Poor foot clearance - left;Poor foot clearance - right             Reviewed HEP given at last session. Pt has not yet performed at home.  Printed out exercise tracker for incr compliance at home.    Exercises Seated March - 1 x daily - 5 x weekly - 1-2 sets - 10 reps - visual cue for incr height of march  Seated Long Arc Quad - 1 x daily - 7 x weekly - 1-2 sets - 10 reps Seated Heel Toe Raises - 1 x daily - 7 x weekly - 1-2 sets - 10 reps Side to side weightshift - 1 x daily - 5 x weekly - 1 sets - 10 reps Staggered Stance Forward Backward Weight Shift with Counter Support - 1 x daily - 5 x weekly - 1 sets - 10 reps   Verbal and demo cues provided.     Balance Exercises - 04/27/21 1517       Balance Exercises: Standing   Sidestepping Upper extremity support;3 reps;Limitations    Sidestepping Limitations down and back 3 reps at countertop with cues for incr step length, BUE support               PT Education - 04/27/21 1529     Education Details reviewed HEP given at last session and printed our exercise tracker from [employer] for incr compliance for home    Person(s) Educated Patient    Methods Explanation;Demonstration;Verbal cues;Handout    Comprehension Verbalized understanding;Returned demonstration              PT Short Term Goals - 04/23/21 1411       PT SHORT TERM GOAL #1   Title Pt will perform initial HEP with family supervision, for improved balance, stregnth, transfers, and gait.  ALL STGS DUE 04/19/21    Baseline 04/22/21:  met for initiatl HEP    Time 4    Period Weeks    Status Achieved    Target Date 04/19/21      PT SHORT TERM GOAL #2   Title Pt will improve gait speed with rollator to at least 1.95 ft/sec in order to demo improved gait effieicency.    Baseline 04/22/21:  gait velocity 1.07 ft/sec; 1.86 ft/sec 04/23/21    Time 4    Period Weeks    Status Not Met               PT Long Term Goals - 03/22/21  1704       PT LONG TERM GOAL #1   Title Pt will perform progression of HEP and walking program, including verbalizing plans  for optimal fitness plan upon d/c from PT.  ALL LTGS DUE 05/17/21.    Time 8    Period Weeks    Status New    Target Date 05/17/21      PT LONG TERM GOAL #2   Title Pt will improve 5x sit<>stand in less than or equal to 18 seconds for improved transfer efficiency and functional strength.    Baseline 21.50 seconds    Time 8    Period Weeks    Status New      PT LONG TERM GOAL #3   Title Pt will improve gait velocity to at least 2.3 ft/sec for improved gait efficiency and safety.    Baseline 1.86 ft/sec with rollator.    Time 8    Period Weeks    Status New      PT LONG TERM GOAL #4   Title Pt will improve 2MWT distance with rollator to at least 210' feet for improved gait efficiency and endurance.    Baseline 185' with rollator    Time 8    Period Weeks    Status New      PT LONG TERM GOAL #5   Title Pt and pt's husband will verbalize understanding of local PD resources.    Time 8    Period Weeks    Status New                   Plan - 04/27/21 1807     Clinical Impression Statement Lowered height of pt's rollator as it was too high, with pt able to demonstrate improved posture afterwards as well as pt self reporting that she felt better and less hunched over with gait. Reviewed HEP provided at last session and provided exercise tracker for incr compliance as pt has not yet performed at home.    Personal Factors and Comorbidities Comorbidity 3+    Comorbidities PMH:  HTN, DM, hypothyroidism, orthostatic hypotension    Examination-Activity Limitations Locomotion Level;Stand;Transfers;Stairs    Examination-Participation Restrictions Community Activity;Other   Plans for travel in retirement   Stability/Clinical Decision Making Evolving/Moderate complexity    Rehab Potential Good    PT Frequency 2x / week   12 visits over 12 weeks   PT Duration  12 weeks   12 visits over 12 weeks   PT Treatment/Interventions ADLs/Self Care Home Management;DME Instruction;Neuromuscular re-education;Balance training;Therapeutic exercise;Therapeutic activities;Functional mobility training;Gait training;Patient/family education;Manual techniques;Stair training;Orthotic Fit/Training    PT Next Visit Plan Review additions to HEP as needed (i think her husband will be present); continue strength and balance; continue gait training with rollator - with foot clearance and incr step length, weigthshfiting.    Consulted and Agree with Plan of Care Patient             Patient will benefit from skilled therapeutic intervention in order to improve the following deficits and impairments:  Abnormal gait, Difficulty walking, Decreased balance, Decreased mobility, Decreased strength, Postural dysfunction, Decreased activity tolerance, Decreased endurance, Decreased coordination  Visit Diagnosis: Muscle weakness (generalized)  Other symptoms and signs involving the nervous system  Unsteadiness on feet     Problem List Patient Active Problem List   Diagnosis Date Noted   Pain due to onychomycosis of toenails of both feet 06/17/2020   Parkinson's disease (HCC) 04/23/2020   Leg swelling 11/26/2019   Chronic venous insufficiency 11/26/2019   Vitamin B12 deficiency 02/06/2018   Sepsis secondary to UTI (HCC) 01/29/2018   Microcytic anemia 01/29/2018     Renal insufficiency 01/29/2018   Pure hypercholesterolemia 12/24/2014   Diabetes mellitus type 2, controlled, without complications (HCC) 12/24/2014   Bruit of right carotid artery 12/24/2014   Thyroid disease 07/06/2014   Severe obesity (BMI >= 40) (HCC) 07/06/2014   Essential hypertension 07/06/2014    Chloe N Gilgannon, PT, DPT  04/27/2021, 6:08 PM  [facility] [address] [place], [name], [address] Phone: [phone]   Fax:   [phone]  Name: Beverlyn A Domanski MRN: [id number] Date of Birth: 07/19/1951    

## 2021-04-27 NOTE — Patient Instructions (Signed)
Coordination Exercises ° °Perform the following exercises for 20 minutes 1 times per day. Perform with both hand(s). Perform using big movements. ° °· Flipping Cards: Place deck of cards on the table. Flip cards over by opening your hand big to grasp and then turn your palm up big. °· Deal cards: Hold 1/2 or whole deck in your hand. Use thumb to push card off top of deck with one big push. °· Rotate ball with fingertips: Pick up with fingers/thumb and move as much as you can with each turn/movement (clockwise and counter-clockwise). °· Toss ball from one hand to the other: Toss big/high. °· Toss ball in the air and catch with the same hand: Toss big/high. °· Pick up coins and stack one at a time: Pick up with big, intentional movements. Do not drag coin to the edge. (5-10 in a stack) °· Pick up 5-10 coins one at a time and hold in palm. Then, move coins from palm to fingertips one at time and place in coin bank/container. °· Practice writing: Slow down, write big, and focus on forming each letter. °· Perform "Flicks"/hand stretches (PWR! Hands): Close hands then flick out your fingers with focus on opening hands, pulling wrists back, and extending elbows like you are pushing. °

## 2021-04-27 NOTE — Therapy (Signed)
[facility] [address] [place], [place], [address] Phone: [phone]   Fax:  [phone]  Occupational Therapy Treatment  Patient Details  Name: Anna Bernard MRN: [id number] Date of Birth: 05/26/1951 Referring Provider (OT): Dr. Arbutus Leas   Encounter Date: 04/27/2021   OT End of Session - 04/27/21 1410     Visit Number 4    Number of Visits 25    Date for OT Re-Evaluation 07/09/21    Authorization Type [employer] Medicare    Authorization - Visit Number 4    Progress Note Due on Visit 10    OT Start Time 1405    OT Stop Time 1445    OT Time Calculation (min) 40 min    Behavior During Therapy [facility] for tasks assessed/performed;Flat affect             Past Medical History:  Diagnosis Date   Anemia, unspecified    Hyperlipidemia, unspecified    Hypertension    Hypothyroidism, unspecified    Pre-diabetes    Vitamin B12 deficiency     Past Surgical History:  Procedure Laterality Date   ABDOMINAL HYSTERECTOMY     GIVENS CAPSULE STUDY N/A 04/16/2018   Procedure: GIVENS CAPSULE STUDY;  Surgeon: Jeani Hawking, MD;  Location: [facility] 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;  Surgeon: Carmela Rima, MD;  Location: [facility] OR;  Service: Ophthalmology;  Laterality: Right;   PARS PLANA VITRECTOMY Right 09/29/2020   Procedure: PARS PLANA VITRECTOMY WITH 25 GAUGE - ENDOCAUTRY;  Surgeon: Carmela Rima, MD;  Location: [facility] OR;  Service: Ophthalmology;  Laterality: Right;   PHOTOCOAGULATION WITH LASER Right 08/16/2020   Procedure: PHOTOCOAGULATION WITH LASER;  Surgeon: Carmela Rima, MD;  Location: [facility] OR;  Service: Ophthalmology;  Laterality: Right;   PHOTOCOAGULATION WITH LASER Right 09/29/2020   Procedure: PHOTOCOAGULATION WITH LASER;  Surgeon: Carmela Rima, MD;  Location: [facility] OR;  Service:  Ophthalmology;  Laterality: Right;   SHOULDER SURGERY     TONSILLECTOMY     TUBAL LIGATION      There were no vitals filed for this visit.   Subjective Assessment - 04/27/21 1409     Subjective  Pt reports mild hip pain    Pertinent History Pt is a 70 y.o. female with recent Parkinson's disease diagnosis.  Pt with PMH that includes:anemia, hyperlipidemia, orthostatic hypotension, HTN, hypothyroidism, Vitamin B12 deficiency, hx of R RTC surgery, leg swelling, arthritis (per pt), obesity    Patient Stated Goals get better    Currently in Pain? Yes    Pain Score 4     Pain Location Hip    Pain Orientation Left    Pain Descriptors / Indicators Aching    Pain Type Chronic pain    Pain Onset More than a month ago    Pain Frequency Intermittent    Aggravating Factors  walking    Pain Relieving Factors rest,  meds                                  OT Treatment/ Education - 04/27/21 1418     Education Details coordination HEP- see pt instructions, min v.c for amplitude, education regarding handwriting strategies with practice, min v.c    Person(s) Educated Patient    Methods Explanation;Demonstration;Verbal cues;Handout    Comprehension  Verbalized understanding;Returned demonstration;Verbal cues required              OT Short Term Goals - 04/27/21 1426       OT SHORT TERM GOAL #1   Title Pt will be independent with PD-specific HEP--check STGs 05/14/21    Time 4    Period Weeks    Status New    Target Date 05/14/21      OT SHORT TERM GOAL #2   Title Pt will verbalize understanding of adapted strategies to maximize safety and I with ADLs/ IADLs .    Time 4    Period Weeks    Status New      OT SHORT TERM GOAL #3   Title Pt will improve R hand coordination for ADLs as shown by improving time on 9-hole peg test by at least 3 secs    Baseline 44.34    Time 4    Period Weeks    Status New      OT SHORT TERM GOAL #4   Title Pt will demonstrate  improved ease with dressing as evidenced by  performing  PPT#4 donning/ doffing 18 secs or less    Time 4    Period Weeks    Status New               OT Long Term Goals - 04/16/21 1444       OT LONG TERM GOAL #1   Title Pt will verbalize understanding of ways to prevent future PD related complications and PD community resources.    Time 12    Period Weeks    Status New    Target Date 07/09/21      OT LONG TERM GOAL #2   Title Pt will verbalize  understanding of ways to keep thinking skills sharp.    Time 12    Period Weeks    Status New      OT LONG TERM GOAL #3   Title Pt will retrieve a lightweight object from overhead shelf with RUE with -10 elbow extension.    Time 12    Period Weeks    Status New      OT LONG TERM GOAL #4   Title Pt will perform LB dressing consistently mod I.    Time 12    Period Weeks    Status New                   Plan - 04/27/21 1410     Clinical Impression Statement Pt is progressing toward goals. She demonstrates understanding of coordination HEP.    OT Occupational Profile and History Problem Focused Assessment - Including review of records relating to presenting problem    Occupational performance deficits (Please refer to evaluation for details): ADL's;IADL's;Leisure;Social Participation    Body Structure / Function / Physical Skills ADL;UE functional use;Balance;Flexibility;FMC;Gait;ROM;Coordination;GMC;Decreased knowledge of precautions;Decreased knowledge of use of DME;IADL;Dexterity;Strength;Mobility    Rehab Potential Good    OT Frequency 2x / week    OT Duration 12 weeks    OT Treatment/Interventions Self-care/ADL training;Energy conservation;Patient/family education;DME and/or AE instruction;Aquatic Therapy;Balance training;Fluidtherapy;Building services engineer;Therapeutic exercise;Moist Heat;Manual Therapy;Therapeutic activities;Neuromuscular education    Plan ADL strategies- LB dressing, review bag exercises     Consulted and Agree with Plan of Care Patient             Patient will benefit from skilled therapeutic intervention in order to improve the following deficits and impairments:   Body Structure /  Function / Physical Skills: ADL, UE functional use, Balance, Flexibility, FMC, Gait, ROM, Coordination, GMC, Decreased knowledge of precautions, Decreased knowledge of use of DME, IADL, Dexterity, Strength, Mobility       Visit Diagnosis: Muscle weakness (generalized)  Other symptoms and signs involving the nervous system  Abnormal posture  Other symptoms and signs involving the musculoskeletal system  Stiffness of right shoulder, not elsewhere classified  Other lack of coordination  Unsteadiness on feet  Stiffness of right elbow, not elsewhere classified    Problem List Patient Active Problem List   Diagnosis Date Noted   Pain due to onychomycosis of toenails of both feet 06/17/2020   Parkinson's disease (HCC) 04/23/2020   Leg swelling 11/26/2019   Chronic venous insufficiency 11/26/2019   Vitamin B12 deficiency 02/06/2018   Sepsis secondary to UTI (HCC) 01/29/2018   Microcytic anemia 01/29/2018   Renal insufficiency 01/29/2018   Pure hypercholesterolemia 12/24/2014   Diabetes mellitus type 2, controlled, without complications (HCC) 12/24/2014   Bruit of right carotid artery 12/24/2014   Thyroid disease 07/06/2014   Severe obesity (BMI >= 40) (HCC) 07/06/2014   Essential hypertension 07/06/2014    Trevan Messman 04/27/2021, 4:06 PM  [facility] [address] [place], [place], [address] Phone: [phone]   Fax:  [phone]  Name: KIRTI CARL MRN: [id number] Date of Birth: 01-Aug-1951

## 2021-04-28 ENCOUNTER — Encounter: Payer: Self-pay | Admitting: Physical Therapy

## 2021-04-28 ENCOUNTER — Ambulatory Visit: Payer: Medicare Other | Admitting: Occupational Therapy

## 2021-04-28 ENCOUNTER — Ambulatory Visit: Payer: Medicare Other | Admitting: Physical Therapy

## 2021-04-28 DIAGNOSIS — R29818 Other symptoms and signs involving the nervous system: Secondary | ICD-10-CM

## 2021-04-28 DIAGNOSIS — R293 Abnormal posture: Secondary | ICD-10-CM

## 2021-04-28 DIAGNOSIS — M25621 Stiffness of right elbow, not elsewhere classified: Secondary | ICD-10-CM

## 2021-04-28 DIAGNOSIS — M6281 Muscle weakness (generalized): Secondary | ICD-10-CM

## 2021-04-28 DIAGNOSIS — M25611 Stiffness of right shoulder, not elsewhere classified: Secondary | ICD-10-CM

## 2021-04-28 DIAGNOSIS — R278 Other lack of coordination: Secondary | ICD-10-CM

## 2021-04-28 DIAGNOSIS — R29898 Other symptoms and signs involving the musculoskeletal system: Secondary | ICD-10-CM

## 2021-04-28 DIAGNOSIS — R2681 Unsteadiness on feet: Secondary | ICD-10-CM

## 2021-04-28 DIAGNOSIS — R2689 Other abnormalities of gait and mobility: Secondary | ICD-10-CM | POA: Diagnosis not present

## 2021-04-28 NOTE — Therapy (Signed)
[facility] [address] [place], [place], [address] Phone: [phone]   Fax:  [phone]  Occupational Therapy Treatment  Patient Details  Name: Anna Bernard MRN: [id number] Date of Birth: 21-Sep-1950 Referring Provider (OT): Dr. Arbutus Leas   Encounter Date: 04/28/2021   OT End of Session - 04/28/21 1427     Visit Number 5    Number of Visits 25    Date for OT Re-Evaluation 07/09/21    Authorization Type [employer] Medicare    Authorization - Visit Number 5    Authorization - Number of Visits 10    OT Start Time 1403    OT Stop Time 1443    OT Time Calculation (min) 40 min             Past Medical History:  Diagnosis Date   Anemia, unspecified    Hyperlipidemia, unspecified    Hypertension    Hypothyroidism, unspecified    Pre-diabetes    Vitamin B12 deficiency     Past Surgical History:  Procedure Laterality Date   ABDOMINAL HYSTERECTOMY     GIVENS CAPSULE STUDY N/A 04/16/2018   Procedure: GIVENS CAPSULE STUDY;  Surgeon: Jeani Hawking, MD;  Location: [facility] 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;  Surgeon: Carmela Rima, MD;  Location: [facility] OR;  Service: Ophthalmology;  Laterality: Right;   PARS PLANA VITRECTOMY Right 09/29/2020   Procedure: PARS PLANA VITRECTOMY WITH 25 GAUGE - ENDOCAUTRY;  Surgeon: Carmela Rima, MD;  Location: [facility] OR;  Service: Ophthalmology;  Laterality: Right;   PHOTOCOAGULATION WITH LASER Right 08/16/2020   Procedure: PHOTOCOAGULATION WITH LASER;  Surgeon: Carmela Rima, MD;  Location: [facility] OR;  Service: Ophthalmology;  Laterality: Right;   PHOTOCOAGULATION WITH LASER Right 09/29/2020   Procedure: PHOTOCOAGULATION WITH LASER;  Surgeon: Carmela Rima, MD;  Location: [facility] OR;  Service: Ophthalmology;  Laterality: Right;   SHOULDER SURGERY     TONSILLECTOMY      TUBAL LIGATION      There were no vitals filed for this visit.   Subjective Assessment - 04/28/21 1412     Pertinent History Pt is a 70 y.o. female with recent Parkinson's disease diagnosis.  Pt with PMH that includes:anemia, hyperlipidemia, orthostatic hypotension, HTN, hypothyroidism, Vitamin B12 deficiency, hx of R RTC surgery, leg swelling, arthritis (per pt), obesity    Patient Stated Goals get better    Currently in Pain? No/denies               Treatment: Dynamic functional reach with trunk rotation in seated , min v.c for amplitude. Reviewed HEP with pt/ husband for improved carryover at home.                   OT Education - 04/28/21 1413     Education Details reveiwed PWR! moves basic 4  in supine, min v.c for amplitude, bag exercises for simulated ADLS from previously issued HEP, pt's husband was present today so that he can reinforce at home    Person(s) Educated Patient    Methods Explanation;Demonstration;Verbal cues    Comprehension Verbalized understanding;Returned demonstration;Verbal cues required              OT Short Term Goals - 04/27/21 1426       OT SHORT TERM GOAL #1   Title Pt will be independent with PD-specific HEP--check STGs 05/14/21  Time 4    Period Weeks    Status New    Target Date 05/14/21      OT SHORT TERM GOAL #2   Title Pt will verbalize understanding of adapted strategies to maximize safety and I with ADLs/ IADLs .    Time 4    Period Weeks    Status New      OT SHORT TERM GOAL #3   Title Pt will improve R hand coordination for ADLs as shown by improving time on 9-hole peg test by at least 3 secs    Baseline 44.34    Time 4    Period Weeks    Status New      OT SHORT TERM GOAL #4   Title Pt will demonstrate improved ease with dressing as evidenced by  performing  PPT#4 donning/ doffing 18 secs or less    Time 4    Period Weeks    Status New               OT Long Term Goals - 04/16/21 1444        OT LONG TERM GOAL #1   Title Pt will verbalize understanding of ways to prevent future PD related complications and PD community resources.    Time 12    Period Weeks    Status New    Target Date 07/09/21      OT LONG TERM GOAL #2   Title Pt will verbalize  understanding of ways to keep thinking skills sharp.    Time 12    Period Weeks    Status New      OT LONG TERM GOAL #3   Title Pt will retrieve a lightweight object from overhead shelf with RUE with -10 elbow extension.    Time 12    Period Weeks    Status New      OT LONG TERM GOAL #4   Title Pt will perform LB dressing consistently mod I.    Time 12    Period Weeks    Status New                   Plan - 04/28/21 1610     Clinical Impression Statement Pt is progressing toward goals. She  is responding well to v.c for amplitude.    OT Occupational Profile and History Problem Focused Assessment - Including review of records relating to presenting problem    Occupational performance deficits (Please refer to evaluation for details): ADL's;IADL's;Leisure;Social Participation    Body Structure / Function / Physical Skills ADL;UE functional use;Balance;Flexibility;FMC;Gait;ROM;Coordination;GMC;Decreased knowledge of precautions;Decreased knowledge of use of DME;IADL;Dexterity;Strength;Mobility    Rehab Potential Good    OT Frequency 2x / week    OT Duration 12 weeks    OT Treatment/Interventions Self-care/ADL training;Energy conservation;Patient/family education;DME and/or AE instruction;Aquatic Therapy;Balance training;Fluidtherapy;Building services engineer;Therapeutic exercise;Moist Heat;Manual Therapy;Therapeutic activities;Neuromuscular education    Plan ADL strategies- LB dressing, coordination activities    Consulted and Agree with Plan of Care Patient             Patient will benefit from skilled therapeutic intervention in order to improve the following deficits and impairments:   Body  Structure / Function / Physical Skills: ADL, UE functional use, Balance, Flexibility, FMC, Gait, ROM, Coordination, GMC, Decreased knowledge of precautions, Decreased knowledge of use of DME, IADL, Dexterity, Strength, Mobility       Visit Diagnosis: Muscle weakness (generalized)  Other symptoms and signs involving the nervous  system  Abnormal posture  Other symptoms and signs involving the musculoskeletal system  Stiffness of right shoulder, not elsewhere classified  Other lack of coordination  Unsteadiness on feet  Stiffness of right elbow, not elsewhere classified    Problem List Patient Active Problem List   Diagnosis Date Noted   Pain due to onychomycosis of toenails of both feet 06/17/2020   Parkinson's disease (HCC) 04/23/2020   Leg swelling 11/26/2019   Chronic venous insufficiency 11/26/2019   Vitamin B12 deficiency 02/06/2018   Sepsis secondary to UTI (HCC) 01/29/2018   Microcytic anemia 01/29/2018   Renal insufficiency 01/29/2018   Pure hypercholesterolemia 12/24/2014   Diabetes mellitus type 2, controlled, without complications (HCC) 12/24/2014   Bruit of right carotid artery 12/24/2014   Thyroid disease 07/06/2014   Severe obesity (BMI >= 40) (HCC) 07/06/2014   Essential hypertension 07/06/2014    Taila Basinski 04/28/2021, 4:11 PM  [facility] [address] [place], [place], [address] Phone: [phone]   Fax:  [phone]  Name: LATRELL REITAN MRN: [id number] Date of Birth: 06/14/1951

## 2021-04-28 NOTE — Therapy (Addendum)
[facility] [address] [place], [place], [address] Phone: [phone]   Fax:  [phone]  Physical Therapy Treatment  Patient Details  Name: Anna Bernard MRN: [id number] Date of Birth: 03-15-1951 Referring Provider (PT): Alonza Bogus, DO   Encounter Date: 04/28/2021   PT End of Session - 04/28/21 1632     Visit Number 7    Number of Visits 13    Date for PT Re-Evaluation 06/17/21    Authorization Type UHC Medicare    PT Start Time 1448    PT Stop Time 1530    PT Time Calculation (min) 42 min    Equipment Utilized During Treatment Gait belt    Activity Tolerance Patient tolerated treatment well    Behavior During Therapy WFL for tasks assessed/performed;Flat affect             Past Medical History:  Diagnosis Date   Anemia, unspecified    Hyperlipidemia, unspecified    Hypertension    Hypothyroidism, unspecified    Pre-diabetes    Vitamin B12 deficiency     Past Surgical History:  Procedure Laterality Date   ABDOMINAL HYSTERECTOMY     GIVENS CAPSULE STUDY N/A 04/16/2018   Procedure: GIVENS CAPSULE STUDY;  Surgeon: Carol Ada, MD;  Location: [facility];  Service: Endoscopy;  Laterality: N/A;   PARS PLANA VITRECTOMY Right 08/16/2020   Procedure: 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;  Surgeon: Jalene Mullet, MD;  Location: [facility];  Service: Ophthalmology;  Laterality: Right;   PARS PLANA VITRECTOMY Right 09/29/2020   Procedure: PARS PLANA VITRECTOMY WITH 25 GAUGE - ENDOCAUTRY;  Surgeon: Jalene Mullet, MD;  Location: [facility];  Service: Ophthalmology;  Laterality: Right;   PHOTOCOAGULATION WITH LASER Right 08/16/2020   Procedure: PHOTOCOAGULATION WITH LASER;  Surgeon: Jalene Mullet, MD;  Location: [facility];  Service: Ophthalmology;  Laterality: Right;   PHOTOCOAGULATION WITH LASER Right 09/29/2020   Procedure: PHOTOCOAGULATION WITH LASER;  Surgeon: Jalene Mullet,  MD;  Location: [facility];  Service: Ophthalmology;  Laterality: Right;   SHOULDER SURGERY     TONSILLECTOMY     TUBAL LIGATION      There were no vitals filed for this visit.   Subjective Assessment - 04/28/21 1451     Subjective No changes. Pt's husband doug is here today    Pertinent History Dx with Parkinson's disease (Dr. Carles Collet) 04/2020; PMH of HTN, DM, hypothyroidism, orthostatic hypotension    Limitations Walking    Patient Stated Goals wants to get back on track with an exercise program.    Currently in Pain? Yes    Pain Score 3     Pain Location Hip    Pain Orientation Left    Pain Descriptors / Indicators Sore    Pain Type Chronic pain    Pain Onset More than a month ago    Aggravating Factors  nothing    Pain Relieving Factors taking aleve                           Neuro Re-education   Reviewed PWR! Moves in sitting from HEP. Performed at edge of mat with aerobic step under pt's feet.   PWR! Up for posture, x 10 reps.  Cues for scapular retraction to avoid posterior lean in sitting.   PWR! Rock for [place], 10 reps through hips only, then 10 reps with added UE reach.  Cues for open  hands and elbow extension   Modified trunk rotation with reach and look over shoulder, 2 sets x 5 reps each side.   PWR! Step for step initiation, single step out and in, 10 reps    Initial cues for technique and intensity, purpose of exercises.     Reviewed [facility] HEP [facility]) Seated March - 1 x daily - 5 x weekly - 1-2 sets - 10 reps - visual cue for incr height of march Seated Long Arc Quad - 1 x daily - 7 x weekly - 1-2 sets - 10 reps Seated Heel Toe Raises - 1 x daily - 7 x weekly - 1-2 sets - 10 reps Side to side weightshift - 1 x daily - 5 x weekly - 1 sets - 10 reps Staggered Stance Forward Backward Weight Shift with Counter Support - 1 x daily - 5 x weekly - 1 sets - 10 reps      [facility] Adult PT Treatment/Exercise - 04/28/21 1520       Transfers    Transfers Sit to Stand;Stand to Sit    Sit to Stand 5: Supervision;With upper extremity assist;From bed    Stand to Sit 5: Supervision;With upper extremity assist;To bed    Number of Reps 2 sets   5 reps   Comments cues for incr forward lean and proper BLE placement, with instructing pt's husband for cues to provide at home      Ambulation/Gait   Ambulation/Gait Yes    Ambulation/Gait Assistance 5: Supervision    Ambulation/Gait Assistance Details cues for posture and staying close to rollator, and incr foot clearance with RLE, and step length    Ambulation Distance (Feet) 115 Feet    Assistive device Rollator    Gait Pattern Step-through pattern;Decreased step length - right;Decreased step length - left;Decreased stance time - right;Decreased stance time - left;Decreased dorsiflexion - right;Decreased dorsiflexion - left;Shuffle;Poor foot clearance - left;Poor foot clearance - right    Ambulation Surface Level;Indoor                 Balance Exercises - 04/28/21 1526       Balance Exercises: Standing   Stepping Strategy UE support;Lateral;Limitations;10 reps    Stepping Strategy Limitations with BUE support on chair, cues for weight shift and foot clearance               PT Education - 04/28/21 1632     Education Details reviewed HEP with pt's husband present, educated on proper cues for exercises and for gait with rollator for improved posture and step length    Person(s) Educated Patient;Spouse    Methods Explanation;Demonstration;Verbal cues;Handout    Comprehension Verbalized understanding;Returned demonstration;Verbal cues required              PT Short Term Goals - 04/23/21 1411       PT SHORT TERM GOAL #1   Title Pt will perform initial HEP with family supervision, for improved balance, stregnth, transfers, and gait.  ALL STGS DUE 04/19/21    Baseline 04/22/21:  met for initiatl HEP    Time 4    Period Weeks    Status Achieved    Target Date 04/19/21       PT SHORT TERM GOAL #2   Title Pt will improve gait speed with rollator to at least 1.95 ft/sec in order to demo improved gait effieicency.    Baseline 04/22/21:  gait velocity 1.07 ft/sec; 1.86 ft/sec 04/23/21    Time 4  Period Weeks    Status Not Met               PT Long Term Goals - 03/22/21 1704       PT LONG TERM GOAL #1   Title Pt will perform progression of HEP and walking program, including verbalizing plans for optimal fitness plan upon d/c from PT.  ALL LTGS DUE 05/17/21.    Time 8    Period Weeks    Status New    Target Date 05/17/21      PT LONG TERM GOAL #2   Title Pt will improve 5x sit<>stand in less than or equal to 18 seconds for improved transfer efficiency and functional strength.    Baseline 21.50 seconds    Time 8    Period Weeks    Status New      PT LONG TERM GOAL #3   Title Pt will improve gait velocity to at least 2.3 ft/sec for improved gait efficiency and safety.    Baseline 1.86 ft/sec with rollator.    Time 8    Period Weeks    Status New      PT LONG TERM GOAL #4   Title Pt will improve 2MWT distance with rollator to at least 210' feet for improved gait efficiency and endurance.    Baseline 185' with rollator    Time 8    Period Weeks    Status New      PT LONG TERM GOAL #5   Title Pt and pt's husband will verbalize understanding of local PD resources.    Time 8    Period Weeks    Status New                   Plan - 04/28/21 1727     Clinical Impression Statement Pt's spouse present during session to review entirety of HEP for incr reinforcement and compliance at home. All questions answered and gave husband education for proper cues for exercises, sit <> stand transfers, and gait with rollator. Will continue to progress towards LTGs.    Personal Factors and Comorbidities Comorbidity 3+    Comorbidities PMH:  HTN, DM, hypothyroidism, orthostatic hypotension    Examination-Activity Limitations Locomotion  Level;Stand;Transfers;Stairs    Examination-Participation Restrictions Community Activity;Other   Plans for travel in retirement   Stability/Clinical Decision Making Evolving/Moderate complexity    Rehab Potential Good    PT Frequency 2x / week   12 visits over 12 weeks   PT Duration 12 weeks   12 visits over 12 weeks   PT Treatment/Interventions ADLs/Self Care Home Management;DME Instruction;Neuromuscular re-education;Balance training;Therapeutic exercise;Therapeutic activities;Functional mobility training;Gait training;Patient/family education;Manual techniques;Stair training;Orthotic Fit/Training    PT Next Visit Plan continue strength and balance; continue gait training with rollator - with foot clearance and incr step length, weigthshfiting.    Consulted and Agree with Plan of Care Patient    Family Member Consulted husband, Marden Noble             Patient will benefit from skilled therapeutic intervention in order to improve the following deficits and impairments:  Abnormal gait, Difficulty walking, Decreased balance, Decreased mobility, Decreased strength, Postural dysfunction, Decreased activity tolerance, Decreased endurance, Decreased coordination  Visit Diagnosis: Muscle weakness (generalized)  Other symptoms and signs involving the nervous system  Abnormal posture     Problem List Patient Active Problem List   Diagnosis Date Noted   Pain due to onychomycosis of toenails of both feet 06/17/2020  Parkinson's disease ([facility]) 04/23/2020   Leg swelling 11/26/2019   Chronic venous insufficiency 11/26/2019   Vitamin B12 deficiency 02/06/2018   Sepsis secondary to UTI ([facility]) 01/29/2018   Microcytic anemia 01/29/2018   Renal insufficiency 01/29/2018   Pure hypercholesterolemia 12/24/2014   Diabetes mellitus type 2, controlled, without complications ([facility]) 98/26/4158   Bruit of right carotid artery 12/24/2014   Thyroid disease 07/06/2014   Severe obesity (BMI >= 40) ([facility])  07/06/2014   Essential hypertension 07/06/2014    Arliss Journey, PT, DPT  04/28/2021, 5:28 PM  [facility] [address] [place], [place], [address] Phone: [phone]   Fax:  [phone]  Name: JOHNNAE IMPASTATO MRN: [id number] Date of Birth: 08/17/1951

## 2021-05-05 ENCOUNTER — Other Ambulatory Visit: Payer: Self-pay

## 2021-05-05 ENCOUNTER — Encounter: Payer: Self-pay | Admitting: Physical Therapy

## 2021-05-05 ENCOUNTER — Ambulatory Visit: Payer: Medicare Other | Admitting: Physical Therapy

## 2021-05-05 ENCOUNTER — Ambulatory Visit: Payer: Medicare Other | Admitting: Occupational Therapy

## 2021-05-05 DIAGNOSIS — M6281 Muscle weakness (generalized): Secondary | ICD-10-CM

## 2021-05-05 DIAGNOSIS — M25621 Stiffness of right elbow, not elsewhere classified: Secondary | ICD-10-CM

## 2021-05-05 DIAGNOSIS — R29818 Other symptoms and signs involving the nervous system: Secondary | ICD-10-CM

## 2021-05-05 DIAGNOSIS — R29898 Other symptoms and signs involving the musculoskeletal system: Secondary | ICD-10-CM

## 2021-05-05 DIAGNOSIS — R2689 Other abnormalities of gait and mobility: Secondary | ICD-10-CM | POA: Diagnosis not present

## 2021-05-05 DIAGNOSIS — R278 Other lack of coordination: Secondary | ICD-10-CM

## 2021-05-05 DIAGNOSIS — R293 Abnormal posture: Secondary | ICD-10-CM

## 2021-05-05 DIAGNOSIS — M25611 Stiffness of right shoulder, not elsewhere classified: Secondary | ICD-10-CM

## 2021-05-05 DIAGNOSIS — R2681 Unsteadiness on feet: Secondary | ICD-10-CM

## 2021-05-05 NOTE — Therapy (Signed)
[facility] [address] [place], [place], [address] Phone: [phone]   Fax:  [phone]  Occupational Therapy Treatment  Patient Details  Name: Anna Bernard MRN: [id number] Date of Birth: 04/30/1951 Referring Provider (OT): Dr. Arbutus Leas   Encounter Date: 05/05/2021   OT End of Session - 05/05/21 1410     Visit Number 6    Number of Visits 25    Date for OT Re-Evaluation 07/09/21    Authorization Type UHC Medicare    Authorization - Visit Number 6    Authorization - Number of Visits 10    Progress Note Due on Visit 10    OT Start Time 1405    OT Stop Time 1445    OT Time Calculation (min) 40 min             Past Medical History:  Diagnosis Date   Anemia, unspecified    Hyperlipidemia, unspecified    Hypertension    Hypothyroidism, unspecified    Pre-diabetes    Vitamin B12 deficiency     Past Surgical History:  Procedure Laterality Date   ABDOMINAL HYSTERECTOMY     GIVENS CAPSULE STUDY N/A 04/16/2018   Procedure: GIVENS CAPSULE STUDY;  Surgeon: Jeani Hawking, MD;  Location: 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;  Surgeon: Carmela Rima, MD;  Location[facility] OR;  Service: Ophthalmology;  Laterality: Right;   PARS PLANA VITRECTOMY Right 09/29/2020   Procedure: PARS PLANA VITRECTOMY WITH 25 GAUGE - ENDOCAUTRY;  Surgeon: Carmela Rima, MD;  Location: [facility] OR;  Service: Ophthalmology;  Laterality: Right;   PHOTOCOAGULATION WITH LASER Right 08/16/2020   Procedure: PHOTOCOAGULATION WITH LASER;  Surgeon: Carmela Rima, MD;  Location: [facility] OR;  Service: Ophthalmology;  Laterality: Right;   PHOTOCOAGULATION WITH LASER Right 09/29/2020   Procedure: PHOTOCOAGULATION WITH LASER;  Surgeon: Carmela Rima, MD;  Location: [facility] OR;  Service: Ophthalmology;  Laterality: Right;    SHOULDER SURGERY     TONSILLECTOMY     TUBAL LIGATION      There were no vitals filed for this visit.   Subjective Assessment - 05/05/21 1409     Subjective  Pt's husband has questions about exercises    Pertinent History Pt is a 70 y.o. female with recent Parkinson's disease diagnosis.  Pt with PMH that includes:anemia, hyperlipidemia, orthostatic hypotension, HTN, hypothyroidism, Vitamin B12 deficiency, hx of R RTC surgery, leg swelling, arthritis (per pt), obesity    Patient Stated Goals get better    Currently in Pain? Yes    Pain Score 3     Pain Location Hip    Pain Orientation Left    Pain Descriptors / Indicators Aching    Pain Type Chronic pain    Pain Onset More than a month ago    Pain Frequency Intermittent    Aggravating Factors  nothing    Pain Relieving Factors taking aleve                 Treatment: Pt practiced donning and doffing shorts with supervision, min v.c. Pt was encouraged to increase her participation in ADLS at home. Dynamic step and reach with minguard to flip playing cards min-mod v.c for amplitude.                  OT Education - 05/05/21 1410     Education Details  reveiwed PWR! moves basic 4  in supine, min v.c for amplitude, pt/ husband had questions about the exercises    Person(s) Educated Patient    Methods Explanation;Demonstration;Verbal cues    Comprehension Verbalized understanding;Returned demonstration;Verbal cues required              OT Short Term Goals - 04/27/21 1426       OT SHORT TERM GOAL #1   Title Pt will be independent with PD-specific HEP--check STGs 05/14/21    Time 4    Period Weeks    Status New    Target Date 05/14/21      OT SHORT TERM GOAL #2   Title Pt will verbalize understanding of adapted strategies to maximize safety and I with ADLs/ IADLs .    Time 4    Period Weeks    Status New      OT SHORT TERM GOAL #3   Title Pt will improve R hand coordination for ADLs as shown by  improving time on 9-hole peg test by at least 3 secs    Baseline 44.34    Time 4    Period Weeks    Status New      OT SHORT TERM GOAL #4   Title Pt will demonstrate improved ease with dressing as evidenced by  performing  PPT#4 donning/ doffing 18 secs or less    Time 4    Period Weeks    Status New               OT Long Term Goals - 04/16/21 1444       OT LONG TERM GOAL #1   Title Pt will verbalize understanding of ways to prevent future PD related complications and PD community resources.    Time 12    Period Weeks    Status New    Target Date 07/09/21      OT LONG TERM GOAL #2   Title Pt will verbalize  understanding of ways to keep thinking skills sharp.    Time 12    Period Weeks    Status New      OT LONG TERM GOAL #3   Title Pt will retrieve a lightweight object from overhead shelf with RUE with -10 elbow extension.    Time 12    Period Weeks    Status New      OT LONG TERM GOAL #4   Title Pt will perform LB dressing consistently mod I.    Time 12    Period Weeks    Status New                   Plan - 05/05/21 1417     Clinical Impression Statement Pt is progressing towards goals. Pt/ husband verbalize understanding of PWR! supine following  review.    OT Occupational Profile and History Problem Focused Assessment - Including review of records relating to presenting problem    Occupational performance deficits (Please refer to evaluation for details): ADL's;IADL's;Leisure;Social Participation    Body Structure / Function / Physical Skills ADL;UE functional use;Balance;Flexibility;FMC;Gait;ROM;Coordination;GMC;Decreased knowledge of precautions;Decreased knowledge of use of DME;IADL;Dexterity;Strength;Mobility    Rehab Potential Good    OT Frequency 2x / week    OT Duration 12 weeks    OT Treatment/Interventions Self-care/ADL training;Energy conservation;Patient/family education;DME and/or AE instruction;Aquatic Therapy;Balance  training;Fluidtherapy;Building services engineer;Therapeutic exercise;Moist Heat;Manual Therapy;Therapeutic activities;Neuromuscular education    Plan ADL strategies- LB dressing, coordination activities    Consulted and  Agree with Plan of Care Patient             Patient will benefit from skilled therapeutic intervention in order to improve the following deficits and impairments:   Body Structure / Function / Physical Skills: ADL, UE functional use, Balance, Flexibility, FMC, Gait, ROM, Coordination, GMC, Decreased knowledge of precautions, Decreased knowledge of use of DME, IADL, Dexterity, Strength, Mobility       Visit Diagnosis: Muscle weakness (generalized)  Other symptoms and signs involving the nervous system  Abnormal posture  Other symptoms and signs involving the musculoskeletal system  Stiffness of right shoulder, not elsewhere classified  Other lack of coordination  Unsteadiness on feet  Stiffness of right elbow, not elsewhere classified    Problem List Patient Active Problem List   Diagnosis Date Noted   Pain due to onychomycosis of toenails of both feet 06/17/2020   Parkinson's disease ([facility]) 04/23/2020   Leg swelling 11/26/2019   Chronic venous insufficiency 11/26/2019   Vitamin B12 deficiency 02/06/2018   Sepsis secondary to UTI ([facility]) 01/29/2018   Microcytic anemia 01/29/2018   Renal insufficiency 01/29/2018   Pure hypercholesterolemia 12/24/2014   Diabetes mellitus type 2, controlled, without complications ([facility]) 12/24/2014   Bruit of right carotid artery 12/24/2014   Thyroid disease 07/06/2014   Severe obesity (BMI >= 40) ([facility]) 07/06/2014   Essential hypertension 07/06/2014    Aizen Duval 05/05/2021, 3:48 PM  [facility] [address] [place], [place], [address] Phone: [phone]   Fax:  [phone]  Name: ITZIA CUNLIFFE MRN: [id number] Date of Birth: [birth year]

## 2021-05-05 NOTE — Patient Instructions (Signed)
Walking Program  Using your rollator to walk around your house (down your hallway) 2 minutes for 2 times a day.  Working on: TALL POSTURE BIG STEPS and [employer] UP YOUR FEET!

## 2021-05-05 NOTE — Therapy (Signed)
[facility] [address] [place], [place], [address] Phone: [phone]   Fax:  [phone]  Physical Therapy Treatment  Patient Details  Name: Anna Bernard MRN: [id number] Date of Birth: 22-May-1951 Referring Provider (PT): Alonza Bogus, DO   Encounter Date: 05/05/2021   PT End of Session - 05/05/21 1619     Visit Number 8    Number of Visits 13    Date for PT Re-Evaluation 06/17/21    Authorization Type UHC Medicare    PT Start Time 8768    PT Stop Time 1530    PT Time Calculation (min) 43 min    Equipment Utilized During Treatment Gait belt    Activity Tolerance Patient tolerated treatment well    Behavior During Therapy WFL for tasks assessed/performed;Flat affect             Past Medical History:  Diagnosis Date   Anemia, unspecified    Hyperlipidemia, unspecified    Hypertension    Hypothyroidism, unspecified    Pre-diabetes    Vitamin B12 deficiency     Past Surgical History:  Procedure Laterality Date   ABDOMINAL HYSTERECTOMY     GIVENS CAPSULE STUDY N/A 04/16/2018   Procedure: GIVENS CAPSULE STUDY;  Surgeon: Carol Ada, MD;  Location: [facility];  Service: Endoscopy;  Laterality: N/A;   PARS PLANA VITRECTOMY Right 08/16/2020   Procedure: 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;  Surgeon: Jalene Mullet, MD;  Location: [facility];  Service: Ophthalmology;  Laterality: Right;   PARS PLANA VITRECTOMY Right 09/29/2020   Procedure: PARS PLANA VITRECTOMY WITH 25 GAUGE - ENDOCAUTRY;  Surgeon: Jalene Mullet, MD;  Location: [facility];  Service: Ophthalmology;  Laterality: Right;   PHOTOCOAGULATION WITH LASER Right 08/16/2020   Procedure: PHOTOCOAGULATION WITH LASER;  Surgeon: Jalene Mullet, MD;  Location: [facility];  Service: Ophthalmology;  Laterality: Right;   PHOTOCOAGULATION WITH LASER Right 09/29/2020   Procedure: PHOTOCOAGULATION WITH LASER;  Surgeon: Jalene Mullet,  MD;  Location: [facility];  Service: Ophthalmology;  Laterality: Right;   SHOULDER SURGERY     TONSILLECTOMY     TUBAL LIGATION      There were no vitals filed for this visit.   Subjective Assessment - 05/05/21 1450     Subjective No changes. Pt wants to review her seated PWR moves.    Pertinent History Dx with Parkinson's disease (Dr. Carles Collet) 04/2020; PMH of HTN, DM, hypothyroidism, orthostatic hypotension    Limitations Walking    Patient Stated Goals wants to get back on track with an exercise program.    Currently in Pain? --   "a little pain in hip but not as bad"   Pain Onset More than a month ago                               [facility] Adult PT Treatment/Exercise - 05/05/21 1509       Transfers   Transfers Sit to Stand;Stand to Sit    Sit to Stand 5: Supervision;With upper extremity assist;From bed    Stand to Sit 5: Supervision;With upper extremity assist;To bed    Comments x5 reps with cues for forward weight shift and tall posture once in standing      Ambulation/Gait   Ambulation/Gait Yes    Ambulation/Gait Assistance 5: Supervision    Ambulation/Gait Assistance Details cues for posture and foot clearance esp with RLE and  step length, after 2 laps- pt with SOB with O2 at 100% and HR at 84 bpm, cues for pursed lip breathing and pt needing seated rest break. cues for rocking through hips once standing to help initiate gait    Ambulation Distance (Feet) 230 Feet   x1   Assistive device Rollator    Gait Pattern Step-through pattern;Decreased step length - right;Decreased step length - left;Decreased stance time - right;Decreased stance time - left;Decreased dorsiflexion - right;Decreased dorsiflexion - left;Shuffle;Poor foot clearance - left;Poor foot clearance - right    Ambulation Surface Level;Indoor    Gait Comments discussed walking program for home for endurance/exercise, educated pt and pt's husband to walk 2 minutes 2 times a day with focus on posture, step  length                 Balance Exercises - 05/05/21 1521       Balance Exercises: Standing   Stepping Strategy Anterior;Lateral;UE support;Limitations    Stepping Strategy Limitations with BUE support, alternating foward stepping with cues for weight shift/foot clearance, x5 reps B lateral stepping over 2" foam beam - incr difficulty with RLE    Sidestepping Upper extremity support;3 reps;Limitations    Sidestepping Limitations down and back 3 reps at countertop with cues for incr step length, BUE support - pt able to perform in 4 big steps consistently    Marching Solid surface;Upper extremity assist 2;Static;10 reps    Marching Limitations with BUE support, 2 x 10 reps, cues for stomping for incr intensity of movement             Neuro Re-education   Reviewed PWR! Moves in sitting from HEP (per pt request)   PWR! Up for posture, x 10 reps.  Cues for scapular retraction to avoid posterior lean in sitting.   PWR! Rock for weightshifting - x10 reps B - pt needs cues to reach across body    Modified trunk rotation with reach and look over shoulder, 2 sets x 5 reps each side.   PWR! Step for step initiation, single step out and in, 10 reps    Initial cues for technique and intensity   PT Education - 05/05/21 1618     Education Details walking program for home with rollator    Person(s) Educated Patient;Spouse    Methods Explanation;Handout    Comprehension Verbalized understanding              PT Short Term Goals - 04/23/21 1411       PT SHORT TERM GOAL #1   Title Pt will perform initial HEP with family supervision, for improved balance, stregnth, transfers, and gait.  ALL STGS DUE 04/19/21    Baseline 04/22/21:  met for initiatl HEP    Time 4    Period Weeks    Status Achieved    Target Date 04/19/21      PT SHORT TERM GOAL #2   Title Pt will improve gait speed with rollator to at least 1.95 ft/sec in order to demo improved gait effieicency.    Baseline  04/22/21:  gait velocity 1.07 ft/sec; 1.86 ft/sec 04/23/21    Time 4    Period Weeks    Status Not Met               PT Long Term Goals - 03/22/21 1704       PT LONG TERM GOAL #1   Title Pt will perform progression of HEP and walking program, including  verbalizing plans for optimal fitness plan upon d/c from PT.  ALL LTGS DUE 05/17/21.    Time 8    Period Weeks    Status New    Target Date 05/17/21      PT LONG TERM GOAL #2   Title Pt will improve 5x sit<>stand in less than or equal to 18 seconds for improved transfer efficiency and functional strength.    Baseline 21.50 seconds    Time 8    Period Weeks    Status New      PT LONG TERM GOAL #3   Title Pt will improve gait velocity to at least 2.3 ft/sec for improved gait efficiency and safety.    Baseline 1.86 ft/sec with rollator.    Time 8    Period Weeks    Status New      PT LONG TERM GOAL #4   Title Pt will improve 2MWT distance with rollator to at least 210' feet for improved gait efficiency and endurance.    Baseline 185' with rollator    Time 8    Period Weeks    Status New      PT LONG TERM GOAL #5   Title Pt and pt's husband will verbalize understanding of local PD resources.    Time 8    Period Weeks    Status New                   Plan - 05/05/21 1624     Clinical Impression Statement Reviewed seated PWR moves at start of session due to pt and pt's husband request with initial cues for PWR Up and [employer]. Continued to work on gait training with rollator with cues for posture, step length and foot clearance. Pt did well with using rocking through hips to help initiate gait. Added walking program for home beginning at 2 minutes to help with endurance. Will continue to progress towards LTGs.    Personal Factors and Comorbidities Comorbidity 3+    Comorbidities PMH:  HTN, DM, hypothyroidism, orthostatic hypotension    Examination-Activity Limitations Locomotion Level;Stand;Transfers;Stairs     Examination-Participation Restrictions Community Activity;Other   Plans for travel in retirement   Stability/Clinical Decision Making Evolving/Moderate complexity    Rehab Potential Good    PT Frequency 2x / week   12 visits over 12 weeks   PT Duration 12 weeks   12 visits over 12 weeks   PT Treatment/Interventions ADLs/Self Care Home Management;DME Instruction;Neuromuscular re-education;Balance training;Therapeutic exercise;Therapeutic activities;Functional mobility training;Gait training;Patient/family education;Manual techniques;Stair training;Orthotic Fit/Training    PT Next Visit Plan PRACTICE STAIRS. continue strength and balance; continue gait training with rollator - with foot clearance and incr step length, weigthshfiting.    Consulted and Agree with Plan of Care Patient    Family Member Consulted husband, Marden Noble             Patient will benefit from skilled therapeutic intervention in order to improve the following deficits and impairments:  Abnormal gait, Difficulty walking, Decreased balance, Decreased mobility, Decreased strength, Postural dysfunction, Decreased activity tolerance, Decreased endurance, Decreased coordination  Visit Diagnosis: Muscle weakness (generalized)  Other symptoms and signs involving the nervous system  Unsteadiness on feet     Problem List Patient Active Problem List   Diagnosis Date Noted   Pain due to onychomycosis of toenails of both feet 06/17/2020   Parkinson's disease ([facility]) 04/23/2020   Leg swelling 11/26/2019   Chronic venous insufficiency 11/26/2019   Vitamin B12 deficiency  02/06/2018   Sepsis secondary to UTI ([facility]) 01/29/2018   Microcytic anemia 01/29/2018   Renal insufficiency 01/29/2018   Pure hypercholesterolemia 12/24/2014   Diabetes mellitus type 2, controlled, without complications ([facility]) 23/95/3202   Bruit of right carotid artery 12/24/2014   Thyroid disease 07/06/2014   Severe obesity (BMI >= 40) ([facility]) 07/06/2014    Essential hypertension 07/06/2014    Arliss Journey, PT, DPT  05/05/2021, 4:26 PM  [facility] [address] [place], [place], [address] Phone: [phone]   Fax:  [phone]  Name: Anna Bernard MRN: [id number] Date of Birth: Aug 03, 1951

## 2021-05-07 ENCOUNTER — Encounter: Payer: Self-pay | Admitting: Physical Therapy

## 2021-05-07 ENCOUNTER — Other Ambulatory Visit: Payer: Self-pay

## 2021-05-07 ENCOUNTER — Ambulatory Visit: Payer: Medicare Other | Admitting: Physical Therapy

## 2021-05-07 ENCOUNTER — Ambulatory Visit: Payer: Medicare Other | Admitting: Occupational Therapy

## 2021-05-07 DIAGNOSIS — M25611 Stiffness of right shoulder, not elsewhere classified: Secondary | ICD-10-CM

## 2021-05-07 DIAGNOSIS — R29898 Other symptoms and signs involving the musculoskeletal system: Secondary | ICD-10-CM

## 2021-05-07 DIAGNOSIS — M6281 Muscle weakness (generalized): Secondary | ICD-10-CM

## 2021-05-07 DIAGNOSIS — R2681 Unsteadiness on feet: Secondary | ICD-10-CM

## 2021-05-07 DIAGNOSIS — R29818 Other symptoms and signs involving the nervous system: Secondary | ICD-10-CM

## 2021-05-07 DIAGNOSIS — M25621 Stiffness of right elbow, not elsewhere classified: Secondary | ICD-10-CM

## 2021-05-07 DIAGNOSIS — R278 Other lack of coordination: Secondary | ICD-10-CM

## 2021-05-07 DIAGNOSIS — R293 Abnormal posture: Secondary | ICD-10-CM

## 2021-05-07 DIAGNOSIS — R2689 Other abnormalities of gait and mobility: Secondary | ICD-10-CM | POA: Diagnosis not present

## 2021-05-07 NOTE — Therapy (Addendum)
[facility] [address] [place], [place], [address] Phone: [phone]   Fax:  [phone]  Physical Therapy Treatment  Patient Details  Name: Anna Bernard MRN: [id number] Date of Birth: 1950/10/13 Referring Provider (PT): Alonza Bogus, DO   Encounter Date: 05/07/2021   PT End of Session - 05/07/21 1315     Visit Number 9    Number of Visits 13    Date for PT Re-Evaluation 06/17/21    Authorization Type UHC Medicare    PT Start Time 1230    PT Stop Time 1311    PT Time Calculation (min) 41 min    Equipment Utilized During Treatment Gait belt    Activity Tolerance Patient tolerated treatment well    Behavior During Therapy WFL for tasks assessed/performed;Flat affect             Past Medical History:  Diagnosis Date   Anemia, unspecified    Hyperlipidemia, unspecified    Hypertension    Hypothyroidism, unspecified    Pre-diabetes    Vitamin B12 deficiency     Past Surgical History:  Procedure Laterality Date   ABDOMINAL HYSTERECTOMY     GIVENS CAPSULE STUDY N/A 04/16/2018   Procedure: GIVENS CAPSULE STUDY;  Surgeon: Carol Ada, MD;  Location: [facility];  Service: Endoscopy;  Laterality: N/A;   PARS PLANA VITRECTOMY Right 08/16/2020   Procedure: 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;  Surgeon: Jalene Mullet, MD;  Location: [facility];  Service: Ophthalmology;  Laterality: Right;   PARS PLANA VITRECTOMY Right 09/29/2020   Procedure: PARS PLANA VITRECTOMY WITH 25 GAUGE - ENDOCAUTRY;  Surgeon: Jalene Mullet, MD;  Location: [facility];  Service: Ophthalmology;  Laterality: Right;   PHOTOCOAGULATION WITH LASER Right 08/16/2020   Procedure: PHOTOCOAGULATION WITH LASER;  Surgeon: Jalene Mullet, MD;  Location: [facility];  Service: Ophthalmology;  Laterality: Right;   PHOTOCOAGULATION WITH LASER Right 09/29/2020   Procedure: PHOTOCOAGULATION WITH LASER;  Surgeon: Jalene Mullet,  MD;  Location: [facility];  Service: Ophthalmology;  Laterality: Right;   SHOULDER SURGERY     TONSILLECTOMY     TUBAL LIGATION      There were no vitals filed for this visit.   Subjective Assessment - 05/07/21 1231     Subjective Nothing new, same old same old.    Pertinent History Dx with Parkinson's disease (Dr. Carles Collet) 04/2020; PMH of HTN, DM, hypothyroidism, orthostatic hypotension    Limitations Walking    Patient Stated Goals wants to get back on track with an exercise program.    Currently in Pain? Yes    Pain Score 3     Pain Location Hip    Pain Orientation Left    Pain Descriptors / Indicators Aching    Pain Type Chronic pain    Pain Onset More than a month ago    Aggravating Factors  nothing    Pain Relieving Factors taking aleve                               [facility] Adult PT Treatment/Exercise - 05/07/21 1250       Transfers   Transfers Sit to Stand;Stand to Sit    Sit to Stand 5: Supervision;With upper extremity assist;From bed    Stand to Sit 5: Supervision;With upper extremity assist;To bed    Comments performed a couple reps throughout session      Ambulation/Gait  Ambulation/Gait Yes    Ambulation/Gait Assistance 5: Supervision    Ambulation/Gait Assistance Details once in standing cues to help rock through hips to initiate gait, cues for posture and incr step length throughout/foot clearance esp with RLE    Ambulation Distance (Feet) 115 Feet   x1, plus additional distances throughout clinic   Assistive device Rollator    Gait Pattern Step-through pattern;Decreased step length - right;Decreased step length - left;Decreased stance time - right;Decreased stance time - left;Decreased dorsiflexion - right;Decreased dorsiflexion - left;Shuffle;Poor foot clearance - left;Poor foot clearance - right    Ambulation Surface Level;Indoor    Stairs Yes    Stairs Assistance 4: Min guard    Stair Management Technique Two rails;Step to  pattern;Forwards;Alternating pattern    Number of Stairs 4   x2   Height of Stairs 6    Gait Comments stair training: pt initially performing with B handrails and ascending step through pattern, cues for descending with down with weaker leg (LLE), 2nd set performed with step to ascending with leading with stronger RLE (pt reporting this felt easier). discussed importance of her spouse standing in front of her when she is descending for incr safety, pt reports he is doing this      Therapeutic Activites    Therapeutic Activities Other Therapeutic Activities    Other Therapeutic Activities O2 sats after activity between 94-95%, cues for pursed lip breathing with O2 sats incr up to 96-97%                 Balance Exercises - 05/07/21 1251       Balance Exercises: Standing   Stepping Strategy Anterior;UE support;10 reps;Limitations    Stepping Strategy Limitations x10 reps B alternating forward stepping to colorful floor disc with BUE support - cues for posture. forward stepping and tossing bean bag into crate x15 reps total alternating leg with single UE support, then performed with lateral stepping and taking a big step/toss into crate x8 reps each side. cues for big reach with UE when tossing    Retro Gait 3 reps;Limitations    Retro Gait Limitations in // bars, pt able to take incr step length    Marching Solid surface;Forwards;Limitations    Marching Limitations down and back 3 reps in // bars, cues for incr step height                 PT Short Term Goals - 04/23/21 1411       PT SHORT TERM GOAL #1   Title Pt will perform initial HEP with family supervision, for improved balance, stregnth, transfers, and gait.  ALL STGS DUE 04/19/21    Baseline 04/22/21:  met for initiatl HEP    Time 4    Period Weeks    Status Achieved    Target Date 04/19/21      PT SHORT TERM GOAL #2   Title Pt will improve gait speed with rollator to at least 1.95 ft/sec in order to demo improved  gait effieicency.    Baseline 04/22/21:  gait velocity 1.07 ft/sec; 1.86 ft/sec 04/23/21    Time 4    Period Weeks    Status Not Met               PT Long Term Goals - 03/22/21 1704       PT LONG TERM GOAL #1   Title Pt will perform progression of HEP and walking program, including verbalizing plans for optimal fitness plan upon  d/c from PT.  ALL LTGS DUE 05/17/21.    Time 8    Period Weeks    Status New    Target Date 05/17/21      PT LONG TERM GOAL #2   Title Pt will improve 5x sit<>stand in less than or equal to 18 seconds for improved transfer efficiency and functional strength.    Baseline 21.50 seconds    Time 8    Period Weeks    Status New      PT LONG TERM GOAL #3   Title Pt will improve gait velocity to at least 2.3 ft/sec for improved gait efficiency and safety.    Baseline 1.86 ft/sec with rollator.    Time 8    Period Weeks    Status New      PT LONG TERM GOAL #4   Title Pt will improve 2MWT distance with rollator to at least 210' feet for improved gait efficiency and endurance.    Baseline 185' with rollator    Time 8    Period Weeks    Status New      PT LONG TERM GOAL #5   Title Pt and pt's husband will verbalize understanding of local PD resources.    Time 8    Period Weeks    Status New                   Plan - 05/07/21 1323     Clinical Impression Statement Began session with stair training with pt needing initial cues for proper technique and sequencing, with pt needing min guard esp when descending. cues for thinking about kicking LLE out when descending to make sure foot clears step. Remainder of session focused on gait training and standing balance strategies focusing on incr step length and foot clearance. Pt tolerated session well, did need intermittent seated rest breaks due to pt getting SOB and cues for pursed lip breathing with O2 sats incr.    Personal Factors and Comorbidities Comorbidity 3+    Comorbidities PMH:  HTN, DM,  hypothyroidism, orthostatic hypotension    Examination-Activity Limitations Locomotion Level;Stand;Transfers;Stairs    Examination-Participation Restrictions Community Activity;Other   Plans for travel in retirement   Stability/Clinical Decision Making Evolving/Moderate complexity    Rehab Potential Good    PT Frequency 2x / week   12 visits over 12 weeks   PT Duration 12 weeks   12 visits over 12 weeks   PT Treatment/Interventions ADLs/Self Care Home Management;DME Instruction;Neuromuscular re-education;Balance training;Therapeutic exercise;Therapeutic activities;Functional mobility training;Gait training;Patient/family education;Manual techniques;Stair training;Orthotic Fit/Training    PT Next Visit Plan 10th visit PN! continue to [place]. continue strength and balance; continue gait training with rollator - with foot clearance and incr step length, weigthshfiting.    Consulted and Agree with Plan of Care Patient    Family Member Consulted husband, Marden Noble             Patient will benefit from skilled therapeutic intervention in order to improve the following deficits and impairments:  Abnormal gait, Difficulty walking, Decreased balance, Decreased mobility, Decreased strength, Postural dysfunction, Decreased activity tolerance, Decreased endurance, Decreased coordination  Visit Diagnosis: Muscle weakness (generalized)  Other symptoms and signs involving the nervous system  Abnormal posture     Problem List Patient Active Problem List   Diagnosis Date Noted   Pain due to onychomycosis of toenails of both feet 06/17/2020   Parkinson's disease ([facility] [place]) 04/23/2020   Leg swelling 11/26/2019   Chronic venous  insufficiency 11/26/2019   Vitamin B12 deficiency 02/06/2018   Sepsis secondary to UTI ([facility]) 01/29/2018   Microcytic anemia 01/29/2018   Renal insufficiency 01/29/2018   Pure hypercholesterolemia 12/24/2014   Diabetes mellitus type 2, controlled, without complications  ([facility]) 09/03/2445   Bruit of right carotid artery 12/24/2014   Thyroid disease 07/06/2014   Severe obesity (BMI >= 40) ([facility] [facility]) 07/06/2014   Essential hypertension 07/06/2014    Arliss Journey, PT, DPT  05/07/2021, 1:25 PM  [facility] [address] [place], [place], [address] Phone: [phone]   Fax:  [phone]  Name: Anna Bernard MRN: [id number] Date of Birth: [birth year]

## 2021-05-07 NOTE — Therapy (Signed)
[facility] [address] [place], [place], [address] Phone: [phone]   Fax:  [phone]  Occupational Therapy Treatment  Patient Details  Name: Anna Bernard MRN: [id number] Date of Birth: 09/01/1951 Referring Provider (OT): Dr. Arbutus Leas   Encounter Date: 05/07/2021   OT End of Session - 05/07/21 1206     Visit Number 7    Number of Visits 25    Date for OT Re-Evaluation 07/09/21    Authorization Type UHC Medicare    Authorization - Visit Number 7    Authorization - Number of Visits 10    OT Start Time 1149    OT Stop Time 1228    OT Time Calculation (min) 39 min             Past Medical History:  Diagnosis Date   Anemia, unspecified    Hyperlipidemia, unspecified    Hypertension    Hypothyroidism, unspecified    Pre-diabetes    Vitamin B12 deficiency     Past Surgical History:  Procedure Laterality Date   ABDOMINAL HYSTERECTOMY     GIVENS CAPSULE STUDY N/A 04/16/2018   Procedure: GIVENS CAPSULE STUDY;  Surgeon: Jeani Hawking, MD;  Location: [facility] 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;  Surgeon: Carmela Rima, MD;  Location: [facility] OR;  Service: Ophthalmology;  Laterality: Right;   PARS PLANA VITRECTOMY Right 09/29/2020   Procedure: PARS PLANA VITRECTOMY WITH 25 GAUGE - ENDOCAUTRY;  Surgeon: Carmela Rima, MD;  Location: [facility] OR;  Service: Ophthalmology;  Laterality: Right;   PHOTOCOAGULATION WITH LASER Right 08/16/2020   Procedure: PHOTOCOAGULATION WITH LASER;  Surgeon: Carmela Rima, MD;  Location: [facility] OR;  Service: Ophthalmology;  Laterality: Right;   PHOTOCOAGULATION WITH LASER Right 09/29/2020   Procedure: PHOTOCOAGULATION WITH LASER;  Surgeon: Carmela Rima, MD;  Location: [facility] OR;  Service: Ophthalmology;  Laterality: Right;   SHOULDER SURGERY     TONSILLECTOMY      TUBAL LIGATION      There were no vitals filed for this visit.   Subjective Assessment - 05/07/21 1245     Subjective  Denies apin    Pertinent History Pt is a 70 y.o. female with recent Parkinson's disease diagnosis.  Pt with PMH that includes:anemia, hyperlipidemia, orthostatic hypotension, HTN, hypothyroidism, Vitamin B12 deficiency, hx of R RTC surgery, leg swelling, arthritis (per pt), obesity    Patient Stated Goals get better    Currently in Pain? No/denies    Pain Onset More than a month ago                Treatment: Standing at table modified quadraped PWR! Rock, mod facilitation/ v.c Dynamic step reach to place graded clothespins on vertical antennae with left and right UE's min v.c/ facilitation for upright posture and step length Functional reaching to place/ remove  large to small pegs into semicircle with left and right UE's simultaneously, min-mod v.c                   OT Education - 05/07/21 1213     Education Details reviewed flipping and dealing cards, stacking and manipulating cards, PWR1 hands PWR! up from HEP, min v.c and demonstration for techiques with RUE    Person(s) Educated Patient    Methods Explanation;Demonstration;Verbal cues    Comprehension Verbalized understanding;Returned demonstration;Verbal cues required  OT Short Term Goals - 04/27/21 1426       OT SHORT TERM GOAL #1   Title Pt will be independent with PD-specific HEP--check STGs 05/14/21    Time 4    Period Weeks    Status New    Target Date 05/14/21      OT SHORT TERM GOAL #2   Title Pt will verbalize understanding of adapted strategies to maximize safety and I with ADLs/ IADLs .    Time 4    Period Weeks    Status New      OT SHORT TERM GOAL #3   Title Pt will improve R hand coordination for ADLs as shown by improving time on 9-hole peg test by at least 3 secs    Baseline 44.34    Time 4    Period Weeks    Status New      OT SHORT TERM  GOAL #4   Title Pt will demonstrate improved ease with dressing as evidenced by  performing  PPT#4 donning/ doffing 18 secs or less    Time 4    Period Weeks    Status New               OT Long Term Goals - 04/16/21 1444       OT LONG TERM GOAL #1   Title Pt will verbalize understanding of ways to prevent future PD related complications and PD community resources.    Time 12    Period Weeks    Status New    Target Date 07/09/21      OT LONG TERM GOAL #2   Title Pt will verbalize  understanding of ways to keep thinking skills sharp.    Time 12    Period Weeks    Status New      OT LONG TERM GOAL #3   Title Pt will retrieve a lightweight object from overhead shelf with RUE with -10 elbow extension.    Time 12    Period Weeks    Status New      OT LONG TERM GOAL #4   Title Pt will perform LB dressing consistently mod I.    Time 12    Period Weeks    Status New                   Plan - 05/07/21 1208     Clinical Impression Statement Pt is progressing towards goals. She benefits from repetition and v.c for amplitude during functional activities.    OT Occupational Profile and History Problem Focused Assessment - Including review of records relating to presenting problem    Occupational performance deficits (Please refer to evaluation for details): ADL's;IADL's;Leisure;Social Participation    Body Structure / Function / Physical Skills ADL;UE functional use;Balance;Flexibility;FMC;Gait;ROM;Coordination;GMC;Decreased knowledge of precautions;Decreased knowledge of use of DME;IADL;Dexterity;Strength;Mobility    Rehab Potential Good    OT Frequency 2x / week    OT Duration 12 weeks    OT Treatment/Interventions Self-care/ADL training;Energy conservation;Patient/family education;DME and/or AE instruction;Aquatic Therapy;Balance training;Fluidtherapy;Building services engineer;Therapeutic exercise;Moist Heat;Manual Therapy;Therapeutic activities;Neuromuscular  education    Plan work towards short term goals, donning/ doffing jacket    Consulted and Agree with Plan of Care Patient             Patient will benefit from skilled therapeutic intervention in order to improve the following deficits and impairments:   Body Structure / Function / Physical Skills: ADL, UE functional use, Balance, Flexibility, FMC,  Gait, ROM, Coordination, GMC, Decreased knowledge of precautions, Decreased knowledge of use of DME, IADL, Dexterity, Strength, Mobility       Visit Diagnosis: Muscle weakness (generalized)  Other symptoms and signs involving the nervous system  Abnormal posture  Other symptoms and signs involving the musculoskeletal system  Stiffness of right shoulder, not elsewhere classified  Other lack of coordination  Unsteadiness on feet  Stiffness of right elbow, not elsewhere classified    Problem List Patient Active Problem List   Diagnosis Date Noted   Pain due to onychomycosis of toenails of both feet 06/17/2020   Parkinson's disease (HCC) 04/23/2020   Leg swelling 11/26/2019   Chronic venous insufficiency 11/26/2019   Vitamin B12 deficiency 02/06/2018   Sepsis secondary to UTI (HCC) 01/29/2018   Microcytic anemia 01/29/2018   Renal insufficiency 01/29/2018   Pure hypercholesterolemia 12/24/2014   Diabetes mellitus type 2, controlled, without complications (HCC) 12/24/2014   Bruit of right carotid artery 12/24/2014   Thyroid disease 07/06/2014   Severe obesity (BMI >= 40) (HCC) 07/06/2014   Essential hypertension 07/06/2014    Stanford Strauch 05/07/2021, 12:45 PM  [place] [address] [place], [place], [address] Phone: [phone]   Fax:  [phone]  Name: Anna Bernard MRN: [id number] Date of Birth: 18-Nov-1950

## 2021-05-12 ENCOUNTER — Other Ambulatory Visit: Payer: Self-pay

## 2021-05-12 ENCOUNTER — Encounter: Payer: Self-pay | Admitting: Physical Therapy

## 2021-05-12 ENCOUNTER — Ambulatory Visit: Payer: Medicare Other | Admitting: Occupational Therapy

## 2021-05-12 ENCOUNTER — Encounter: Payer: Self-pay | Admitting: Occupational Therapy

## 2021-05-12 ENCOUNTER — Ambulatory Visit: Payer: Medicare Other | Admitting: Physical Therapy

## 2021-05-12 DIAGNOSIS — R2689 Other abnormalities of gait and mobility: Secondary | ICD-10-CM | POA: Diagnosis not present

## 2021-05-12 DIAGNOSIS — R29818 Other symptoms and signs involving the nervous system: Secondary | ICD-10-CM

## 2021-05-12 DIAGNOSIS — R293 Abnormal posture: Secondary | ICD-10-CM

## 2021-05-12 DIAGNOSIS — R29898 Other symptoms and signs involving the musculoskeletal system: Secondary | ICD-10-CM

## 2021-05-12 DIAGNOSIS — M6281 Muscle weakness (generalized): Secondary | ICD-10-CM

## 2021-05-12 DIAGNOSIS — R278 Other lack of coordination: Secondary | ICD-10-CM

## 2021-05-12 DIAGNOSIS — M25611 Stiffness of right shoulder, not elsewhere classified: Secondary | ICD-10-CM

## 2021-05-12 NOTE — Therapy (Signed)
[facility] [address] [place], [place], [address] Phone: [phone]   Fax:  [phone]  Occupational Therapy Treatment  Patient Details  Name: Anna Bernard MRN: [id number] Date of Birth: 1951-09-03 Referring Provider (OT): Dr. Arbutus Leas   Encounter Date: 05/12/2021   OT End of Session - 05/12/21 1503     Visit Number 8    Number of Visits 25    Date for OT Re-Evaluation 07/09/21    Authorization Type UHC Medicare    Authorization - Visit Number 8    Authorization - Number of Visits 10    Progress Note Due on Visit 10    OT Start Time 1450    OT Stop Time 1528    OT Time Calculation (min) 38 min             Past Medical History:  Diagnosis Date   Anemia, unspecified    Hyperlipidemia, unspecified    Hypertension    Hypothyroidism, unspecified    Pre-diabetes    Vitamin B12 deficiency     Past Surgical History:  Procedure Laterality Date   ABDOMINAL HYSTERECTOMY     GIVENS CAPSULE STUDY N/A 04/16/2018   Procedure: GIVENS CAPSULE STUDY;  Surgeon: Jeani Hawking, MD;  Location: [facility] 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;  Surgeon: Carmela Rima, MD;  Location: [facility] OR;  Service: Ophthalmology;  Laterality: Right;   PARS PLANA VITRECTOMY Right 09/29/2020   Procedure: PARS PLANA VITRECTOMY WITH 25 GAUGE - ENDOCAUTRY;  Surgeon: Carmela Rima, MD;  Location: [facility] OR;  Service: Ophthalmology;  Laterality: Right;   PHOTOCOAGULATION WITH LASER Right 08/16/2020   Procedure: PHOTOCOAGULATION WITH LASER;  Surgeon: Carmela Rima, MD;  Location: [facility] OR;  Service: Ophthalmology;  Laterality: Right;   PHOTOCOAGULATION WITH LASER Right 09/29/2020   Procedure: PHOTOCOAGULATION WITH LASER;  Surgeon: Carmela Rima, MD;  Location: [facility] OR;  Service: Ophthalmology;  Laterality: Right;    SHOULDER SURGERY     TONSILLECTOMY     TUBAL LIGATION      There were no vitals filed for this visit.   Subjective Assessment - 05/12/21 1458     Subjective  Pt reports she wants to get her hand working    Pertinent History Pt is a 70 y.o. female with recent Parkinson's disease diagnosis.  Pt with PMH that includes:anemia, hyperlipidemia, orthostatic hypotension, HTN, hypothyroidism, Vitamin B12 deficiency, hx of R RTC surgery, leg swelling, arthritis (per pt), obesity    Patient Stated Goals get better    Currently in Pain? No/denies                    Treatment: copying small peg design with left and right UE's min v.c for RUE use and finger extension. Standing to remove pegs from pegboard and to flick into a container min v.c for amplitude and finger extension/ elbow extension. Seated flicking large playing cards with left and right UE's, min v.c Flipping large playing cards with left and right UE's min v.c for big movements Arm bike x 5 mins level 1 for conditioning, min v.c for speed.               OT Education - 05/12/21 1502     Education Details PWR! up from PWR hands 10 reps each min v.c, reviewed donning/ doffing jacket with larger amplitude movements reviewed x  3 reps, min v.c and demonstration   Person(s) Educated Patient    Methods Explanation;Demonstration;Verbal cues    Comprehension Verbalized understanding;Returned demonstration;Verbal cues required              OT Short Term Goals - 05/12/21 1459       OT SHORT TERM GOAL #1   Title Pt will be independent with PD-specific HEP--check STGs 05/14/21    Time 4    Period Weeks    Status New    Target Date 05/14/21      OT SHORT TERM GOAL #2   Title Pt will verbalize understanding of adapted strategies to maximize safety and I with ADLs/ IADLs .    Time 4    Period Weeks    Status New      OT SHORT TERM GOAL #3   Title Pt will improve R hand coordination for ADLs as shown by  improving time on 9-hole peg test by at least 3 secs    Baseline 44.34    Time 4    Period Weeks    Status On-going      OT SHORT TERM GOAL #4   Title Pt will demonstrate improved ease with dressing as evidenced by  performing  PPT#4 donning/ doffing 18 secs or less    Time 4    Period Weeks    Status On-going               OT Long Term Goals - 04/16/21 1444       OT LONG TERM GOAL #1   Title Pt will verbalize understanding of ways to prevent future PD related complications and PD community resources.    Time 12    Period Weeks    Status New    Target Date 07/09/21      OT LONG TERM GOAL #2   Title Pt will verbalize  understanding of ways to keep thinking skills sharp.    Time 12    Period Weeks    Status New      OT LONG TERM GOAL #3   Title Pt will retrieve a lightweight object from overhead shelf with RUE with -10 elbow extension.    Time 12    Period Weeks    Status New      OT LONG TERM GOAL #4   Title Pt will perform LB dressing consistently mod I.    Time 12    Period Weeks    Status New                    Patient will benefit from skilled therapeutic intervention in order to improve the following deficits and impairments:           Visit Diagnosis: Muscle weakness (generalized)  Other symptoms and signs involving the nervous system  Abnormal posture  Other symptoms and signs involving the musculoskeletal system  Stiffness of right shoulder, not elsewhere classified  Other lack of coordination    Problem List Patient Active Problem List   Diagnosis Date Noted   Pain due to onychomycosis of toenails of both feet 06/17/2020   Parkinson's disease (HCC) 04/23/2020   Leg swelling 11/26/2019   Chronic venous insufficiency 11/26/2019   Vitamin B12 deficiency 02/06/2018   Sepsis secondary to UTI (HCC) 01/29/2018   Microcytic anemia 01/29/2018   Renal insufficiency 01/29/2018   Pure hypercholesterolemia 12/24/2014   Diabetes  mellitus type 2, controlled, without complications (HCC) 12/24/2014   Bruit  of right carotid artery 12/24/2014   Thyroid disease 07/06/2014   Severe obesity (BMI >= 40) (HCC) 07/06/2014   Essential hypertension 07/06/2014    Orvilla Truett 05/12/2021, 3:06 PM  [facility] [address] [place], [place], [address] Phone: [phone]   Fax:  [phone]  Name: Anna Bernard MRN: [id number] Date of Birth: [birth year]

## 2021-05-12 NOTE — Therapy (Addendum)
[place] [address] [place], [place], [address] Phone: [phone]   Fax:  [phone]  Physical Therapy Treatment/10th Visit Progress Note  Patient Details  Name: Anna Bernard MRN: [id number] Date of Birth: Feb 26, 1951 Referring Provider (PT): Alonza Bogus, DO   10th Visit Physical Therapy Progress Note  Dates of Reporting Period: 03/19/21 to 05/12/21   Encounter Date: 05/12/2021   PT End of Session - 05/12/21 1455     Visit Number 10    Number of Visits 13    Date for PT Re-Evaluation 06/17/21    Authorization Type UHC Medicare    PT Start Time 1406    PT Stop Time 1446    PT Time Calculation (min) 40 min    Equipment Utilized During Treatment Gait belt    Activity Tolerance Patient tolerated treatment well    Behavior During Therapy WFL for tasks assessed/performed;Flat affect             Past Medical History:  Diagnosis Date   Anemia, unspecified    Hyperlipidemia, unspecified    Hypertension    Hypothyroidism, unspecified    Pre-diabetes    Vitamin B12 deficiency     Past Surgical History:  Procedure Laterality Date   ABDOMINAL HYSTERECTOMY     GIVENS CAPSULE STUDY N/A 04/16/2018   Procedure: GIVENS CAPSULE STUDY;  Surgeon: Carol Ada, MD;  Location: [place];  Service: Endoscopy;  Laterality: N/A;   PARS PLANA VITRECTOMY Right 08/16/2020   Procedure: 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;  Surgeon: Jalene Mullet, MD;  Location: [place];  Service: Ophthalmology;  Laterality: Right;   PARS PLANA VITRECTOMY Right 09/29/2020   Procedure: PARS PLANA VITRECTOMY WITH 25 GAUGE - ENDOCAUTRY;  Surgeon: Jalene Mullet, MD;  Location: [place];  Service: Ophthalmology;  Laterality: Right;   PHOTOCOAGULATION WITH LASER Right 08/16/2020   Procedure: PHOTOCOAGULATION WITH LASER;  Surgeon: Jalene Mullet, MD;  Location: [place];  Service: Ophthalmology;  Laterality:  Right;   PHOTOCOAGULATION WITH LASER Right 09/29/2020   Procedure: PHOTOCOAGULATION WITH LASER;  Surgeon: Jalene Mullet, MD;  Location: [place];  Service: Ophthalmology;  Laterality: Right;   SHOULDER SURGERY     TONSILLECTOMY     TUBAL LIGATION      There were no vitals filed for this visit.   Subjective Assessment - 05/12/21 1410     Subjective Feels like she is doing better.    Pertinent History Dx with Parkinson's disease (Dr. Carles Collet) 04/2020; PMH of HTN, DM, hypothyroidism, orthostatic hypotension    Limitations Walking    Patient Stated Goals wants to get back on track with an exercise program.    Currently in Pain? --   "just a very little bit in her L hip   Pain Onset More than a month ago                               [facility] Adult PT Treatment/Exercise - 05/12/21 1415       Transfers   Transfers Sit to Stand;Stand to Sit    Sit to Stand 5: Supervision;With upper extremity assist;From bed    Five time sit to stand comments  20.31 seconds with BUE support from chair    Stand to Sit 5: Supervision;With upper extremity assist;To bed      Ambulation/Gait   Ambulation/Gait Yes    Ambulation/Gait Assistance 5: Supervision  Ambulation/Gait Assistance Details cues for posture and foot clearance (esp with RLE), pt with improved stride length today    Ambulation Distance (Feet) 115 Feet   x1 plus additional clinic distances between activities   Assistive device Rollator    Gait Pattern Step-through pattern;Decreased step length - right;Decreased step length - left;Decreased stance time - right;Decreased stance time - left;Decreased dorsiflexion - right;Decreased dorsiflexion - left;Shuffle;Poor foot clearance - left;Poor foot clearance - right    Ambulation Surface Level;Indoor    Gait velocity 15.43 seconds = 2.12 ft/sec      Knee/Hip Exercises: Aerobic   Nustep 4 extremities x 5 minutes, Level 1, for flexibility and strengthening, keeps steps/minute > 60                  Balance Exercises - 05/12/21 1426       Balance Exercises: Standing   SLS with Vectors Solid surface;Upper extremity assist 1;Upper extremity assist 2;Limitations    SLS with Vectors Limitations alternating SLS taps to 4" step with BUE >single UE support x10 reps    Stepping Strategy Lateral;UE support;10 reps;Limitations    Stepping Strategy Limitations x10 reps B alternating stepping over 2" obstacle, cues for posture and looking up into the gym    Step Ups Forward;UE support 2;4 inch;Limitations    Step Ups Limitations alternating legs x10 reps, cues for foot clearance    Retro Gait 3 reps;Limitations    Retro Gait Limitations in // bars, pt able to consistently perform in 10 steps, in other direction perform forward gait with working on posture and incr step length    Other Standing Exercises with wider BOS in // bars and single UE support, performing trunk rotations and reaching across body/pivoting on foot to tap object (held by therapist in different directions) x8 reps B               PT Education - 05/12/21 1455     Education Details progress towards goals    Person(s) Educated Patient    Methods Explanation    Comprehension Verbalized understanding              PT Short Term Goals - 04/23/21 1411       PT SHORT TERM GOAL #1   Title Pt will perform initial HEP with family supervision, for improved balance, stregnth, transfers, and gait.  ALL STGS DUE 04/19/21    Baseline 04/22/21:  met for initiatl HEP    Time 4    Period Weeks    Status Achieved    Target Date 04/19/21      PT SHORT TERM GOAL #2   Title Pt will improve gait speed with rollator to at least 1.95 ft/sec in order to demo improved gait effieicency.    Baseline 04/22/21:  gait velocity 1.07 ft/sec; 1.86 ft/sec 04/23/21    Time 4    Period Weeks    Status Not Met               PT Long Term Goals - 05/12/21 1415       PT LONG TERM GOAL #1   Title Pt will perform  progression of HEP and walking program, including verbalizing plans for optimal fitness plan upon d/c from PT.  ALL LTGS DUE 05/17/21.    Time 8    Period Weeks    Status New      PT LONG TERM GOAL #2   Title Pt will improve 5x sit<>stand in less than  or equal to 18 seconds for improved transfer efficiency and functional strength.    Baseline 21.50 seconds; 20.31 seconds with BUE support from chair on 05/12/21    Time 8    Period Weeks    Status On-going      PT LONG TERM GOAL #3   Title Pt will improve gait velocity to at least 2.3 ft/sec for improved gait efficiency and safety.    Baseline 1.86 ft/sec with rollator; 15.43 seconds = 2.12 ft/sec on 05/12/21    Time 8    Period Weeks    Status On-going      PT LONG TERM GOAL #4   Title Pt will improve 2MWT distance with rollator to at least 210' feet for improved gait efficiency and endurance.    Baseline 185' with rollator    Time 8    Period Weeks    Status New      PT LONG TERM GOAL #5   Title Pt and pt's husband will verbalize understanding of local PD resources.    Time 8    Period Weeks    Status New                   Plan - 05/12/21 1459     Clinical Impression Statement 10th visit progress note: Began to assess pt's LTGs today. Pt slightly improved 5x sit <> stand from mat table to 20.31 seconds (previously 21.50 seconds) from standard height chair with BUE support. Pt improved gait speed with rollator today to 2.12 ft/sec (previously 1.86 ft/sec). Pt able to more consistently taking larger strides, but does still need cues for foot clearance. Remainder of session focused on BLE strengthening and standing balance strategies with SLS, stepping strategies, and trunk rotations. Pt tolerated session well. LTG date updated due to 12 weeks in pt's POC.    Personal Factors and Comorbidities Comorbidity 3+    Comorbidities PMH:  HTN, DM, hypothyroidism, orthostatic hypotension    Examination-Activity Limitations Locomotion  Level;Stand;Transfers;Stairs    Examination-Participation Restrictions Community Activity;Other   Plans for travel in retirement   Stability/Clinical Decision Making Evolving/Moderate complexity    Rehab Potential Good    PT Frequency 2x / week   12 visits over 12 weeks   PT Duration 12 weeks   12 visits over 12 weeks   PT Treatment/Interventions ADLs/Self Care Home Management;DME Instruction;Neuromuscular re-education;Balance training;Therapeutic exercise;Therapeutic activities;Functional mobility training;Gait training;Patient/family education;Manual techniques;Stair training;Orthotic Fit/Training    PT Next Visit Plan continue to practice stairs. continue functional BLE strength and balance; continue gait training/endurance with rollator. activities for incr foot clearance. NuStep for endurance/strengthening. pt with last visits next week - will need to check remainder of goals and determine D/C or re-cert.    Consulted and Agree with Plan of Care Patient    Family Member Consulted husband, Marden Noble             Patient will benefit from skilled therapeutic intervention in order to improve the following deficits and impairments:  Abnormal gait, Difficulty walking, Decreased balance, Decreased mobility, Decreased strength, Postural dysfunction, Decreased activity tolerance, Decreased endurance, Decreased coordination  Visit Diagnosis: Muscle weakness (generalized)  Other symptoms and signs involving the nervous system  Abnormal posture     Problem List Patient Active Problem List   Diagnosis Date Noted   Pain due to onychomycosis of toenails of both feet 06/17/2020   Parkinson's disease ([place]) 04/23/2020   Leg swelling 11/26/2019   Chronic venous insufficiency 11/26/2019   Vitamin  B12 deficiency 02/06/2018   Sepsis secondary to UTI ([place]) 01/29/2018   Microcytic anemia 01/29/2018   Renal insufficiency 01/29/2018   Pure hypercholesterolemia 12/24/2014   Diabetes mellitus type 2,  controlled, without complications ([place]) 37/54/3606   Bruit of right carotid artery 12/24/2014   Thyroid disease 07/06/2014   Severe obesity (BMI >= 40) ([place]) 07/06/2014   Essential hypertension 07/06/2014    Arliss Journey, PT, DPT  05/12/2021, 3:03 PM  [place] [address] [place], [place], [address] Phone: [phone]   Fax:  [phone]  Name: Anna Bernard MRN: [id number] Date of Birth: 1950/10/22

## 2021-05-14 ENCOUNTER — Encounter: Payer: Self-pay | Admitting: Physical Therapy

## 2021-05-14 ENCOUNTER — Ambulatory Visit: Payer: Medicare Other | Admitting: Occupational Therapy

## 2021-05-14 ENCOUNTER — Other Ambulatory Visit: Payer: Self-pay

## 2021-05-14 ENCOUNTER — Ambulatory Visit: Payer: Medicare Other | Admitting: Physical Therapy

## 2021-05-14 DIAGNOSIS — R2689 Other abnormalities of gait and mobility: Secondary | ICD-10-CM

## 2021-05-14 DIAGNOSIS — M6281 Muscle weakness (generalized): Secondary | ICD-10-CM

## 2021-05-14 DIAGNOSIS — R29818 Other symptoms and signs involving the nervous system: Secondary | ICD-10-CM

## 2021-05-14 DIAGNOSIS — R29898 Other symptoms and signs involving the musculoskeletal system: Secondary | ICD-10-CM

## 2021-05-14 DIAGNOSIS — M25611 Stiffness of right shoulder, not elsewhere classified: Secondary | ICD-10-CM

## 2021-05-14 DIAGNOSIS — R293 Abnormal posture: Secondary | ICD-10-CM

## 2021-05-14 DIAGNOSIS — M25621 Stiffness of right elbow, not elsewhere classified: Secondary | ICD-10-CM

## 2021-05-14 DIAGNOSIS — R2681 Unsteadiness on feet: Secondary | ICD-10-CM

## 2021-05-14 DIAGNOSIS — R278 Other lack of coordination: Secondary | ICD-10-CM

## 2021-05-14 NOTE — Therapy (Signed)
[facility] [address] [place], [place], [address] Phone: [phone]   Fax:  [phone]  Physical Therapy Treatment  Patient Details  Name: Anna Bernard MRN: [id number] Date of Birth: 10/09/1950 Referring Provider (PT): Alonza Bogus, DO   Encounter Date: 05/14/2021   PT End of Session - 05/14/21 2256     Visit Number 11    Number of Visits 13    Date for PT Re-Evaluation 06/17/21    Authorization Type UHC Medicare    PT Start Time 1319    PT Stop Time 1401    PT Time Calculation (min) 42 min    Equipment Utilized During Treatment Gait belt    Activity Tolerance Patient tolerated treatment well    Behavior During Therapy WFL for tasks assessed/performed;Flat affect             Past Medical History:  Diagnosis Date   Anemia, unspecified    Hyperlipidemia, unspecified    Hypertension    Hypothyroidism, unspecified    Pre-diabetes    Vitamin B12 deficiency     Past Surgical History:  Procedure Laterality Date   ABDOMINAL HYSTERECTOMY     GIVENS CAPSULE STUDY N/A 04/16/2018   Procedure: GIVENS CAPSULE STUDY;  Surgeon: Carol Ada, MD;  Location: [facility];  Service: Endoscopy;  Laterality: N/A;   PARS PLANA VITRECTOMY Right 08/16/2020   Procedure: 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;  Surgeon: Jalene Mullet, MD;  Location: [facility];  Service: Ophthalmology;  Laterality: Right;   PARS PLANA VITRECTOMY Right 09/29/2020   Procedure: PARS PLANA VITRECTOMY WITH 25 GAUGE - ENDOCAUTRY;  Surgeon: Jalene Mullet, MD;  Location: [facility];  Service: Ophthalmology;  Laterality: Right;   PHOTOCOAGULATION WITH LASER Right 08/16/2020   Procedure: PHOTOCOAGULATION WITH LASER;  Surgeon: Jalene Mullet, MD;  Location: [facility];  Service: Ophthalmology;  Laterality: Right;   PHOTOCOAGULATION WITH LASER Right 09/29/2020   Procedure: PHOTOCOAGULATION WITH LASER;  Surgeon: Jalene Mullet,  MD;  Location: [facility];  Service: Ophthalmology;  Laterality: Right;   SHOULDER SURGERY     TONSILLECTOMY     TUBAL LIGATION      There were no vitals filed for this visit.   Subjective Assessment - 05/14/21 1321     Subjective Having "not much" pain in L hip.  No falls.    Pertinent History Dx with Parkinson's disease (Dr. Carles Collet) 04/2020; PMH of HTN, DM, hypothyroidism, orthostatic hypotension    Limitations Walking    Patient Stated Goals wants to get back on track with an exercise program.    Currently in Pain? Yes    Pain Score 2     Pain Location Hip    Pain Orientation Left    Pain Descriptors / Indicators Aching    Pain Onset More than a month ago    Pain Frequency Intermittent    Aggravating Factors  nothing    Pain Relieving Factors taking aleve                               [facility] Adult PT Treatment/Exercise - 05/14/21 0001       Transfers   Transfers Sit to Stand;Stand to Sit    Sit to Stand 5: Supervision;With upper extremity assist;From bed;Without upper extremity assist;From chair/3-in-1    Stand to Sit 5: Supervision;With upper extremity assist;To bed;Without upper extremity assist;To chair/3-in-1    Number of  Reps Other sets (comment);Other reps (comment)   3 sets x 5 reps from elevated mat height min UE support, 1 additional set from 22" height, hands on knees with min guard/occasional min assist, cues for increased forward lean to stand.     Ambulation/Gait   Ambulation/Gait Yes    Ambulation/Gait Assistance 5: Supervision    Ambulation/Gait Assistance Details 2 MWT:  210 ft    Ambulation Distance (Feet) 230 Feet   115; additional 80 ft of gait   Assistive device Rollator    Gait Pattern Step-through pattern;Decreased step length - right;Decreased step length - left;Decreased stance time - right;Decreased stance time - left;Decreased dorsiflexion - right;Decreased dorsiflexion - left;Shuffle;Poor foot clearance - left;Poor foot clearance -  right    Ambulation Surface Level;Indoor            From 18" chair:  hands on knees, sit<>stand x 3 reps, cues for increased forward lean.  From chair at table, cues for technique to scoot, lift buttocks and push chair back, performed x 2, then sit<>stand with stepping to transfer from table to rollator.  (Leaving table from OT to initiate PT sesion).  Access Code: [email or web address] URL: [email or web address] Date: 04/23/2021 Prepared by: Mady Haagensen   Exercises-REviewed seated exercises below, cues for upright posture. Seated March - 1 x daily - 5 x weekly - 1-2 sets - 10 reps Seated Long Arc Quad - 1 x daily - 7 x weekly - 1-2 sets - 10 reps Seated Heel Toe Raises - 1 x daily - 7 x weekly - 1-2 sets - 10 reps      PT Education - 05/14/21 2256     Education Details Progress with 2MWT goal; discussed POC and pt making progress, still would benefit from skilled PT; pt in agreement    Person(s) Educated Patient    Methods Explanation;Demonstration;Handout    Comprehension Verbalized understanding;Returned demonstration;Verbal cues required              PT Short Term Goals - 04/23/21 1411       PT SHORT TERM GOAL #1   Title Pt will perform initial HEP with family supervision, for improved balance, stregnth, transfers, and gait.  ALL STGS DUE 04/19/21    Baseline 04/22/21:  met for initiatl HEP    Time 4    Period Weeks    Status Achieved    Target Date 04/19/21      PT SHORT TERM GOAL #2   Title Pt will improve gait speed with rollator to at least 1.95 ft/sec in order to demo improved gait effieicency.    Baseline 04/22/21:  gait velocity 1.07 ft/sec; 1.86 ft/sec 04/23/21    Time 4    Period Weeks    Status Not Met               PT Long Term Goals - 05/14/21 2257       PT LONG TERM GOAL #1   Title Pt will perform progression of HEP and walking program, including verbalizing plans for optimal fitness plan upon d/c from PT.  ALL LTGS DUE 06/09/21    Time 12     Period Weeks    Status On-going      PT LONG TERM GOAL #2   Title Pt will improve 5x sit<>stand in less than or equal to 18 seconds for improved transfer efficiency and functional strength.    Baseline 21.50 seconds; 20.31 seconds with BUE support from chair on 05/12/21  Time 12    Period Weeks    Status On-going      PT LONG TERM GOAL #3   Title Pt will improve gait velocity to at least 2.3 ft/sec for improved gait efficiency and safety.    Baseline 1.86 ft/sec with rollator; 15.43 seconds = 2.12 ft/sec on 05/12/21    Time 12    Period Weeks    Status On-going      PT LONG TERM GOAL #4   Title Pt will improve 2MWT distance with rollator to at least 210' feet for improved gait efficiency and endurance.    Baseline 185' with rollator    Time 12    Period Weeks    Status Achieved      PT LONG TERM GOAL #5   Title Pt and pt's husband will verbalize understanding of local PD resources.    Baseline info provided by OT today, 05/14/21    Time 12    Period Weeks    Status Deferred                   Plan - 05/14/21 2259     Clinical Impression Statement Pt has met LTG 4 for improved distance in 2 MWT, improving from 185 ft to 210 ft.  Assessed seated exercises in HEP, and worked on functional strength with repeated sit<>stand from varied heights with focus on less UE support.  Pt performs most sit to stand without UE support with repetition in session today, needing cues for initial forward lean.  Pt is making slow but steady progress towards goals and will conitnue to benefit from skilled PT to address strength, balance, gait.    Personal Factors and Comorbidities Comorbidity 3+;Age    Comorbidities PMH:  HTN, DM, hypothyroidism, orthostatic hypotension    Examination-Activity Limitations Locomotion Level;Stand;Transfers;Stairs    Examination-Participation Restrictions Community Activity;Other   Plans for travel in retirement   Stability/Clinical Decision Making  Evolving/Moderate complexity    Rehab Potential Good    PT Frequency 2x / week   12 visits over 12 weeks   PT Duration 12 weeks   12 visits over 12 weeks   PT Treatment/Interventions ADLs/Self Care Home Management;DME Instruction;Neuromuscular re-education;Balance training;Therapeutic exercise;Therapeutic activities;Functional mobility training;Gait training;Patient/family education;Manual techniques;Stair training;Orthotic Fit/Training    PT Next Visit Plan continue to practice stairs. continue functional BLE strength and balance; continue gait training/endurance with rollator. activities for incr foot clearance. NuStep for endurance/strengthening. pt with last visits next week - will need to determine D/C or re-cert. (Discussed this today and pt would like to continue; will need to recert next week)    Consulted and Agree with Plan of Care Patient    Family Member Consulted husband, Marden Noble             Patient will benefit from skilled therapeutic intervention in order to improve the following deficits and impairments:  Abnormal gait, Difficulty walking, Decreased balance, Decreased mobility, Decreased strength, Postural dysfunction, Decreased activity tolerance, Decreased endurance, Decreased coordination  Visit Diagnosis: Muscle weakness (generalized)  Other symptoms and signs involving the nervous system  Other abnormalities of gait and mobility     Problem List Patient Active Problem List   Diagnosis Date Noted   Pain due to onychomycosis of toenails of both feet 06/17/2020   Parkinson's disease ([facility]) 04/23/2020   Leg swelling 11/26/2019   Chronic venous insufficiency 11/26/2019   Vitamin B12 deficiency 02/06/2018   Sepsis secondary to UTI ([facility]) 01/29/2018   Microcytic anemia  01/29/2018   Renal insufficiency 01/29/2018   Pure hypercholesterolemia 12/24/2014   Diabetes mellitus type 2, controlled, without complications ([facility]) 46/27/0350   Bruit of right carotid artery  12/24/2014   Thyroid disease 07/06/2014   Severe obesity (BMI >= 40) ([facility]) 07/06/2014   Essential hypertension 07/06/2014    Henrry Feil W. 05/14/2021, 11:05 PM Frazier Butt., PT  [facility] [address] [place], [place], [address] Phone: [phone]   Fax:  [phone]  Name: Anna Bernard MRN: [id number] Date of Birth: [birth year]

## 2021-05-14 NOTE — Therapy (Signed)
[facility] [address] [place], [place], [address] Phone: [phone]   Fax:  [phone]  Occupational Therapy Treatment  Patient Details  Name: Anna Bernard MRN: [id number] Date of Birth: Jan 10, 1951 Referring Provider (OT): Dr. Arbutus Leas   Encounter Date: 05/14/2021   OT End of Session - 05/14/21 1337     Visit Number 9    Number of Visits 25    Date for OT Re-Evaluation 07/09/21    Authorization Type UHC Medicare    Authorization - Visit Number 9    Authorization - Number of Visits 10    Progress Note Due on Visit 10    OT Start Time 1235    OT Stop Time 1315    OT Time Calculation (min) 40 min             Past Medical History:  Diagnosis Date   Anemia, unspecified    Hyperlipidemia, unspecified    Hypertension    Hypothyroidism, unspecified    Pre-diabetes    Vitamin B12 deficiency     Past Surgical History:  Procedure Laterality Date   ABDOMINAL HYSTERECTOMY     GIVENS CAPSULE STUDY N/A 04/16/2018   Procedure: GIVENS CAPSULE STUDY;  Surgeon: Anna Hawking, MD;  Location: [facility] 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 OR;  Service: Ophthalmology;  Laterality: Right;   PHOTOCOAGULATION WITH LASER Right 09/29/2020   Procedure: PHOTOCOAGULATION WITH LASER;  Surgeon: Anna Rima, MD;  Location: [facility] OR;  Service: Ophthalmology;  Laterality: Right;    SHOULDER SURGERY     TONSILLECTOMY     TUBAL LIGATION      There were no vitals filed for this visit.   Subjective Assessment - 05/14/21 1339     Pertinent History Pt is a 70 y.o. female with recent Parkinson's disease diagnosis.  Pt with PMH that includes:anemia, hyperlipidemia, orthostatic hypotension, HTN, hypothyroidism, Vitamin B12 deficiency, hx of R RTC surgery, leg swelling, arthritis (per pt), obesity    Patient Stated Goals get better    Currently in Pain? Yes    Pain Score 2     Pain Location Hip    Pain Orientation Left    Pain Descriptors / Indicators Aching    Pain Type Chronic pain    Pain Onset More than a month ago    Pain Frequency Intermittent    Aggravating Factors  unknown    Pain Relieving Factors meds                                  OT Treatment/ Education - 05/14/21 1334     Education Details PWR! hands, PWR up! Therapist  reviewed donning/ doffing jacket with larger amplitude movements, min v.c Reviewed coordination HEP, min v.c initally, pt returned demonstration. Information provided regarding PD specific community resources.  Started checking short term goals   Person(s) Educated Patient    Methods  Explanation;Demonstration;Verbal cues;Handout    Comprehension Verbalized understanding;Returned demonstration;Verbal cues required              OT Short Term Goals - 05/14/21 1237       OT SHORT TERM GOAL #1   Title Pt will be independent with PD-specific HEP--check STGs 05/14/21    Time 4    Period Weeks    Status Achieved   PWR! moves supine and coordination activities   Target Date 05/14/21      OT SHORT TERM GOAL #2   Title Pt will verbalize understanding of adapted strategies to maximize safety and I with ADLs/ IADLs .    Time 4    Period Weeks    Status On-going      OT SHORT TERM GOAL #3   Title Pt will improve R hand coordination for ADLs as shown by improving time on 9-hole peg test by at least 3 secs     Baseline 44.34    Time 4    Period Weeks    Status On-going   41.54     OT SHORT TERM GOAL #4   Title Pt will demonstrate improved ease with dressing as evidenced by  performing  PPT#4 donning/ doffing 18 secs or less    Baseline 25 secs    Time 4    Period Weeks    Status On-going               OT Long Term Goals - 05/14/21 1304       OT LONG TERM GOAL #1   Title Pt will verbalize understanding of ways to prevent future PD related complications and PD community resources.    Time 12    Period Weeks    Status New      OT LONG TERM GOAL #2   Title Pt will verbalize  understanding of ways to keep thinking skills sharp.    Time 12    Period Weeks    Status New      OT LONG TERM GOAL #3   Title Pt will retrieve a lightweight object from overhead shelf with RUE with -10 elbow extension.    Time 12    Period Weeks    Status New      OT LONG TERM GOAL #4   Title Pt will perform LB dressing consistently mod I.    Time 12    Period Weeks    Status New                   Plan - 05/14/21 1237     Clinical Impression Statement Pt is progressing towards goals. She benefits from reinforcement of larger amplitude movements with functional activity.    OT Occupational Profile and History Problem Focused Assessment - Including review of records relating to presenting problem    Occupational performance deficits (Please refer to evaluation for details): ADL's;IADL's;Leisure;Social Participation    Body Structure / Function / Physical Skills ADL;UE functional use;Balance;Flexibility;FMC;Gait;ROM;Coordination;GMC;Decreased knowledge of precautions;Decreased knowledge of use of DME;IADL;Dexterity;Strength;Mobility    Rehab Potential Good    OT Frequency 2x / week    OT Duration 12 weeks    OT Treatment/Interventions Self-care/ADL training;Energy conservation;Patient/family education;DME and/or AE instruction;Aquatic Therapy;Balance training;Fluidtherapy;Marine scientist;Therapeutic exercise;Moist Heat;Manual Therapy;Therapeutic activities;Neuromuscular education    Plan progress note next visit.    Consulted and Agree with Plan of Care Patient             Patient will benefit  from skilled therapeutic intervention in order to improve the following deficits and impairments:   Body Structure / Function / Physical Skills: ADL, UE functional use, Balance, Flexibility, FMC, Gait, ROM, Coordination, GMC, Decreased knowledge of precautions, Decreased knowledge of use of DME, IADL, Dexterity, Strength, Mobility       Visit Diagnosis: Muscle weakness (generalized)  Other symptoms and signs involving the nervous system  Abnormal posture  Other symptoms and signs involving the musculoskeletal system  Stiffness of right shoulder, not elsewhere classified  Other lack of coordination  Unsteadiness on feet  Stiffness of right elbow, not elsewhere classified    Problem List Patient Active Problem List   Diagnosis Date Noted   Pain due to onychomycosis of toenails of both feet 06/17/2020   Parkinson's disease ([facility]) 04/23/2020   Leg swelling 11/26/2019   Chronic venous insufficiency 11/26/2019   Vitamin B12 deficiency 02/06/2018   Sepsis secondary to UTI ([facility]) 01/29/2018   Microcytic anemia 01/29/2018   Renal insufficiency 01/29/2018   Pure hypercholesterolemia 12/24/2014   Diabetes mellitus type 2, controlled, without complications ([facility]) 12/24/2014   Bruit of right carotid artery 12/24/2014   Thyroid disease 07/06/2014   Severe obesity (BMI >= 40) ([facility]) 07/06/2014   Essential hypertension 07/06/2014    Jennefer Kopp 05/14/2021, 1:40 PM  [facility] [address] [place], [place], [address] Phone: [phone]   Fax:  [phone]  Name: EMBERLYNN RIGGAN MRN: [id number] Date of Birth: May 22, 1951

## 2021-05-14 NOTE — Patient Instructions (Signed)
Access Code: [email or web address] URL: [email or web address] Date: 05/14/2021 Prepared by: Lonia Blood  Exercises Seated March - 1 x daily - 5 x weekly - 1-2 sets - 10 reps Seated Long Arc Quad - 1 x daily - 7 x weekly - 1-2 sets - 10 reps Seated Heel Toe Raises - 1 x daily - 7 x weekly - 1-2 sets - 10 reps Side to side weightshift - 1 x daily - 5 x weekly - 1 sets - 10 reps Staggered Stance Forward Backward Weight Shift with Counter Support - 1 x daily - 5 x weekly - 1 sets - 10 reps  Added 05/14/21 Sit to Stand with Hands on Knees - 1-2 x daily - 5 x weekly - 1 sets - 5 reps

## 2021-05-18 ENCOUNTER — Encounter: Payer: Self-pay | Admitting: Physical Therapy

## 2021-05-18 ENCOUNTER — Ambulatory Visit: Payer: Medicare Other | Admitting: Physical Therapy

## 2021-05-18 ENCOUNTER — Other Ambulatory Visit: Payer: Self-pay

## 2021-05-18 ENCOUNTER — Encounter: Payer: Self-pay | Admitting: Occupational Therapy

## 2021-05-18 ENCOUNTER — Ambulatory Visit: Payer: Medicare Other | Admitting: Occupational Therapy

## 2021-05-18 DIAGNOSIS — M25611 Stiffness of right shoulder, not elsewhere classified: Secondary | ICD-10-CM

## 2021-05-18 DIAGNOSIS — R293 Abnormal posture: Secondary | ICD-10-CM

## 2021-05-18 DIAGNOSIS — M6281 Muscle weakness (generalized): Secondary | ICD-10-CM

## 2021-05-18 DIAGNOSIS — R29818 Other symptoms and signs involving the nervous system: Secondary | ICD-10-CM

## 2021-05-18 DIAGNOSIS — R29898 Other symptoms and signs involving the musculoskeletal system: Secondary | ICD-10-CM

## 2021-05-18 DIAGNOSIS — R2689 Other abnormalities of gait and mobility: Secondary | ICD-10-CM

## 2021-05-18 DIAGNOSIS — M25621 Stiffness of right elbow, not elsewhere classified: Secondary | ICD-10-CM

## 2021-05-18 DIAGNOSIS — R2681 Unsteadiness on feet: Secondary | ICD-10-CM

## 2021-05-18 DIAGNOSIS — R278 Other lack of coordination: Secondary | ICD-10-CM

## 2021-05-18 NOTE — Therapy (Signed)
[facility] [address] [place], [place], [address] Phone: [phone]   Fax:  [phone]  Physical Therapy Treatment  Patient Details  Name: Anna Bernard MRN: [id number] Date of Birth: [birth year] Referring Provider (PT): Alonza Bogus, DO   Encounter Date: 05/18/2021   PT End of Session - 05/18/21 1321     Visit Number 12    Number of Visits 13    Date for PT Re-Evaluation 06/17/21    Authorization Type UHC Medicare    PT Start Time 1232    PT Stop Time 1314    PT Time Calculation (min) 42 min    Equipment Utilized During Treatment Gait belt    Activity Tolerance Patient tolerated treatment well    Behavior During Therapy WFL for tasks assessed/performed;Flat affect             Past Medical History:  Diagnosis Date   Anemia, unspecified    Hyperlipidemia, unspecified    Hypertension    Hypothyroidism, unspecified    Pre-diabetes    Vitamin B12 deficiency     Past Surgical History:  Procedure Laterality Date   ABDOMINAL HYSTERECTOMY     GIVENS CAPSULE STUDY N/A 04/16/2018   Procedure: GIVENS CAPSULE STUDY;  Surgeon: Carol Ada, MD;  Location: [facility];  Service: Endoscopy;  Laterality: N/A;   PARS PLANA VITRECTOMY Right 08/16/2020   Procedure: 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;  Surgeon: Jalene Mullet, MD;  Location: [facility];  Service: Ophthalmology;  Laterality: Right;   PARS PLANA VITRECTOMY Right 09/29/2020   Procedure: PARS PLANA VITRECTOMY WITH 25 GAUGE - ENDOCAUTRY;  Surgeon: Jalene Mullet, MD;  Location: [facility];  Service: Ophthalmology;  Laterality: Right;   PHOTOCOAGULATION WITH LASER Right 08/16/2020   Procedure: PHOTOCOAGULATION WITH LASER;  Surgeon: Jalene Mullet, MD;  Location: [facility];  Service: Ophthalmology;  Laterality: Right;   PHOTOCOAGULATION WITH LASER Right 09/29/2020   Procedure: PHOTOCOAGULATION WITH LASER;  Surgeon: Jalene Mullet,  MD;  Location: [facility];  Service: Ophthalmology;  Laterality: Right;   SHOULDER SURGERY     TONSILLECTOMY     TUBAL LIGATION      There were no vitals filed for this visit.   Subjective Assessment - 05/18/21 1236     Subjective No changes, nothing new. Exercises are going well at home.    Pertinent History Dx with Parkinson's disease (Dr. Carles Collet) 04/2020; PMH of HTN, DM, hypothyroidism, orthostatic hypotension    Limitations Walking    Patient Stated Goals wants to get back on track with an exercise program.    Currently in Pain? Yes    Pain Score 3     Pain Location Hip    Pain Orientation Left    Pain Descriptors / Indicators Aching    Pain Type Chronic pain    Pain Onset More than a month ago    Aggravating Factors  nothing    Pain Relieving Factors meds                               [facility] Adult PT Treatment/Exercise - 05/18/21 1243       Transfers   Transfers Sit to Stand;Stand to Sit    Sit to Stand 5: Supervision;Without upper extremity assist    Stand to Sit 5: Supervision;Without upper extremity assist    Number of Reps 2 sets;Other reps (comment)   5 reps, from  elevated mat table with hands on knees, cues for incr forward lean. cues for posture once in standing   Comments plus additional sit <> stands throughout session      Ambulation/Gait   Ambulation/Gait Yes    Ambulation/Gait Assistance 5: Supervision    Ambulation/Gait Assistance Details cues for posture, heel strike/foot clearance. when turning to sit back to mat, cues for incr foot clearance    Ambulation Distance (Feet) 230 Feet   x1   Assistive device Rollator    Gait Pattern Step-through pattern;Decreased step length - right;Decreased step length - left;Decreased stance time - right;Decreased stance time - left;Decreased dorsiflexion - right;Decreased dorsiflexion - left;Shuffle;Poor foot clearance - left;Poor foot clearance - right    Ambulation Surface Level;Indoor    Gait Comments  reviewed walking program at home with pt, pt reports performing at home, but not daily                 Balance Exercises - 05/18/21 1301       Balance Exercises: Standing   SLS with Vectors Solid surface;Upper extremity assist 2    SLS with Vectors Limitations alternating SLS taps to 6" step x10 reps, cues for incr step height with RLE and clearing foot    Stepping Strategy Posterior;Lateral;10 reps;UE support    Stepping Strategy Limitations with BUE support; x10 reps B each direction, use of colorful floor dots for visual cue on where to step laterally, cues for incr RLE foot clearance    Step Ups Forward;UE support 2;Limitations;6 inch    Step Ups Limitations x7 reps B, cues for foot clearance                 PT Short Term Goals - 04/23/21 1411       PT SHORT TERM GOAL #1   Title Pt will perform initial HEP with family supervision, for improved balance, stregnth, transfers, and gait.  ALL STGS DUE 04/19/21    Baseline 04/22/21:  met for initiatl HEP    Time 4    Period Weeks    Status Achieved    Target Date 04/19/21      PT SHORT TERM GOAL #2   Title Pt will improve gait speed with rollator to at least 1.95 ft/sec in order to demo improved gait effieicency.    Baseline 04/22/21:  gait velocity 1.07 ft/sec; 1.86 ft/sec 04/23/21    Time 4    Period Weeks    Status Not Met               PT Long Term Goals - 05/14/21 2257       PT LONG TERM GOAL #1   Title Pt will perform progression of HEP and walking program, including verbalizing plans for optimal fitness plan upon d/c from PT.  ALL LTGS DUE 06/09/21    Time 12    Period Weeks    Status On-going      PT LONG TERM GOAL #2   Title Pt will improve 5x sit<>stand in less than or equal to 18 seconds for improved transfer efficiency and functional strength.    Baseline 21.50 seconds; 20.31 seconds with BUE support from chair on 05/12/21    Time 12    Period Weeks    Status On-going      PT LONG TERM GOAL #3    Title Pt will improve gait velocity to at least 2.3 ft/sec for improved gait efficiency and safety.    Baseline 1.86 ft/sec  with rollator; 15.43 seconds = 2.12 ft/sec on 05/12/21    Time 12    Period Weeks    Status On-going      PT LONG TERM GOAL #4   Title Pt will improve 2MWT distance with rollator to at least 210' feet for improved gait efficiency and endurance.    Baseline 185' with rollator    Time 12    Period Weeks    Status Achieved      PT LONG TERM GOAL #5   Title Pt and pt's husband will verbalize understanding of local PD resources.    Baseline info provided by OT today, 05/14/21    Time 12    Period Weeks    Status Deferred                   Plan - 05/18/21 1324     Clinical Impression Statement Reviewed sit <> stands added to HEP at last session without UE support. Pt needing cues for incr forward lean to come to stand, does improve with incr reps. Remainder of session focused on BLE strengthening, SLS tasks and foot clearance, and stepping strategies. Needs cues throughout for foot clearance with RLE. Will continue to progress towards LTGs.    Personal Factors and Comorbidities Comorbidity 3+;Age    Comorbidities PMH:  HTN, DM, hypothyroidism, orthostatic hypotension    Examination-Activity Limitations Locomotion Level;Stand;Transfers;Stairs    Examination-Participation Restrictions Community Activity;Other   Plans for travel in retirement   Stability/Clinical Decision Making Evolving/Moderate complexity    Rehab Potential Good    PT Frequency 2x / week   12 visits over 12 weeks   PT Duration 12 weeks   12 visits over 12 weeks   PT Treatment/Interventions ADLs/Self Care Home Management;DME Instruction;Neuromuscular re-education;Balance training;Therapeutic exercise;Therapeutic activities;Functional mobility training;Gait training;Patient/family education;Manual techniques;Stair training;Orthotic Fit/Training    PT Next Visit Plan check goals and will need  re-cert. continue to practice stairs. continue functional BLE strength and balance; continue gait training/endurance with rollator. activities for incr foot clearance. NuStep for endurance/strengthening.    Consulted and Agree with Plan of Care Patient    Family Member Consulted husband, Marden Noble             Patient will benefit from skilled therapeutic intervention in order to improve the following deficits and impairments:  Abnormal gait, Difficulty walking, Decreased balance, Decreased mobility, Decreased strength, Postural dysfunction, Decreased activity tolerance, Decreased endurance, Decreased coordination  Visit Diagnosis: Muscle weakness (generalized)  Other symptoms and signs involving the nervous system  Abnormal posture     Problem List Patient Active Problem List   Diagnosis Date Noted   Pain due to onychomycosis of toenails of both feet 06/17/2020   Parkinson's disease ([facility]) 04/23/2020   Leg swelling 11/26/2019   Chronic venous insufficiency 11/26/2019   Vitamin B12 deficiency 02/06/2018   Sepsis secondary to UTI ([facility]) 01/29/2018   Microcytic anemia 01/29/2018   Renal insufficiency 01/29/2018   Pure hypercholesterolemia 12/24/2014   Diabetes mellitus type 2, controlled, without complications ([facility]) 75/44/9201   Bruit of right carotid artery 12/24/2014   Thyroid disease 07/06/2014   Severe obesity (BMI >= 40) ([facility]) 07/06/2014   Essential hypertension 07/06/2014    Arliss Journey, PT, DPT  05/18/2021, 1:26 PM  [facility] [address] [place], [place], [address] Phone: [phone]   Fax:  [phone]  Name: BEATRIZ QUINTELA MRN: [id number] Date of Birth: 08/21/1951

## 2021-05-18 NOTE — Therapy (Signed)
[facility] [address] [place], [place], [address] Phone: [phone]   Fax:  [phone]  Occupational Therapy Treatment / Progress Note  Patient Details  Name: Anna Bernard MRN: [id number] Date of Birth: June 21, 1951 Referring Provider (OT): Dr. Arbutus Leas   Encounter Date: 05/18/2021   OT End of Session - 05/18/21 1323     Visit Number 10    Number of Visits 25    Date for OT Re-Evaluation 07/09/21    Authorization Type UHC Medicare    Authorization - Visit Number 10    Authorization - Number of Visits 10    Progress Note Due on Visit 10    OT Start Time 1320    OT Stop Time 1400    OT Time Calculation (min) 40 min    Activity Tolerance Patient tolerated treatment well    Behavior During Therapy [facility] [place] for tasks assessed/performed;Flat affect             Past Medical History:  Diagnosis Date   Anemia, unspecified    Hyperlipidemia, unspecified    Hypertension    Hypothyroidism, unspecified    Pre-diabetes    Vitamin B12 deficiency     Past Surgical History:  Procedure Laterality Date   ABDOMINAL HYSTERECTOMY     GIVENS CAPSULE STUDY N/A 04/16/2018   Procedure: GIVENS CAPSULE STUDY;  Surgeon: Jeani Hawking, MD;  Location: [facility] 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;  Surgeon: Carmela Rima, MD;  Location: [facility] OR;  Service: Ophthalmology;  Laterality: Right;   PARS PLANA VITRECTOMY Right 09/29/2020   Procedure: PARS PLANA VITRECTOMY WITH 25 GAUGE - ENDOCAUTRY;  Surgeon: Carmela Rima, MD;  Location: [facility] OR;  Service: Ophthalmology;  Laterality: Right;   PHOTOCOAGULATION WITH LASER Right 08/16/2020   Procedure: PHOTOCOAGULATION WITH LASER;  Surgeon: Carmela Rima, MD;  Location: [facility] OR;  Service: Ophthalmology;  Laterality: Right;   PHOTOCOAGULATION WITH LASER Right 09/29/2020    Procedure: PHOTOCOAGULATION WITH LASER;  Surgeon: Carmela Rima, MD;  Location: [facility] OR;  Service: Ophthalmology;  Laterality: Right;   SHOULDER SURGERY     TONSILLECTOMY     TUBAL LIGATION      There were no vitals filed for this visit.   Subjective Assessment - 05/18/21 1323     Subjective  "you use it or you lose it, so I better use it"  Pt denies pain.  Pt reports difficulty with tying shoes and her husband has been performing    Pertinent History Pt is a 70 y.o. female with recent Parkinson's disease diagnosis.  Pt with PMH that includes:anemia, hyperlipidemia, orthostatic hypotension, HTN, hypothyroidism, Vitamin B12 deficiency, hx of R RTC surgery, leg swelling, arthritis (per pt), obesity    Patient Stated Goals get better    Currently in Pain? Yes    Pain Score 2     Pain Location Hip    Pain Orientation Left    Pain Descriptors / Indicators Aching    Pain Type Chronic pain    Pain Onset More than a month ago    Pain Frequency Intermittent    Aggravating Factors  nothing    Pain Relieving Factors aleve              Standing, practiced donning/doffing jacket using large amplitude movement strategies with mod cueing.   Standing, functional reaching overhead at counter with trunk  rotation and lateral wt. Shift with min cueing for large amplitude movements.  Placing small pegs in pegboard with each hand and then with BUEs simultaneously for bilateral hand coordination with min cueing for PWR! Hands prior to grasp and timing of movement with min difficulty/incr time.  Bag exercises for simulated ADLs (reviewed HEP) and pt returned demo each with min cueing for incr movement amplitude.  Sitting, reach for the floor stretch in prep for LB dressing x5 with min cueing.        OT Short Term Goals - 05/18/21 1349       OT SHORT TERM GOAL #1   Title Pt will be independent with PD-specific HEP--check STGs 05/14/21    Time 4    Period Weeks    Status Achieved   PWR!  moves supine and coordination activities   Target Date 05/14/21      OT SHORT TERM GOAL #2   Title Pt will verbalize understanding of adapted strategies to maximize safety and I with ADLs/ IADLs .    Time 4    Period Weeks    Status On-going      OT SHORT TERM GOAL #3   Title Pt will improve R hand coordination for ADLs as shown by improving time on 9-hole peg test by at least 3 secs    Baseline 44.34    Time 4    Period Weeks    Status On-going   41.54     OT SHORT TERM GOAL #4   Title Pt will demonstrate improved ease with dressing as evidenced by  performing  PPT#4 donning/ doffing 18 secs or less    Baseline 25 secs    Time 4    Period Weeks    Status On-going   05/18/21:  38.87sec              OT Long Term Goals - 05/14/21 1304       OT LONG TERM GOAL #1   Title Pt will verbalize understanding of ways to prevent future PD related complications and PD community resources.    Time 12    Period Weeks    Status New      OT LONG TERM GOAL #2   Title Pt will verbalize  understanding of ways to keep thinking skills sharp.    Time 12    Period Weeks    Status New      OT LONG TERM GOAL #3   Title Pt will retrieve a lightweight object from overhead shelf with RUE with -10 elbow extension.    Time 12    Period Weeks    Status New      OT LONG TERM GOAL #4   Title Pt will perform LB dressing consistently mod I.    Time 12    Period Weeks    Status New                   Plan - 05/18/21 1324     Clinical Impression Statement Progress Note Reporting Period 04/16/21-05/18/21:   Pt is progressing slowly towards goals due to severity of deficits, but demo improvements in standing tolerance and posture.  Pt would benefit from continued occupational therapy for further repetition/cues for calibration of normal/bigger movement patterns for functional tasks and to continue pt education for decr risk of future complication and incr ease/safety with ADLs.    OT  Occupational Profile and History Problem Focused Assessment - Including review of  records relating to presenting problem    Occupational performance deficits (Please refer to evaluation for details): ADL's;IADL's;Leisure;Social Participation    Body Structure / Function / Physical Skills ADL;UE functional use;Balance;Flexibility;FMC;Gait;ROM;Coordination;GMC;Decreased knowledge of precautions;Decreased knowledge of use of DME;IADL;Dexterity;Strength;Mobility    Rehab Potential Good    OT Frequency 2x / week    OT Duration 12 weeks    OT Treatment/Interventions Self-care/ADL training;Energy conservation;Patient/family education;DME and/or AE instruction;Aquatic Therapy;Balance training;Fluidtherapy;Building services engineer;Therapeutic exercise;Moist Heat;Manual Therapy;Therapeutic activities;Neuromuscular education    Plan continue with large amplitude movements, standing with functional reach, coordination    Consulted and Agree with Plan of Care Patient             Patient will benefit from skilled therapeutic intervention in order to improve the following deficits and impairments:   Body Structure / Function / Physical Skills: ADL, UE functional use, Balance, Flexibility, FMC, Gait, ROM, Coordination, GMC, Decreased knowledge of precautions, Decreased knowledge of use of DME, IADL, Dexterity, Strength, Mobility       Visit Diagnosis: Other symptoms and signs involving the nervous system  Abnormal posture  Other symptoms and signs involving the musculoskeletal system  Stiffness of right shoulder, not elsewhere classified  Other lack of coordination  Unsteadiness on feet  Stiffness of right elbow, not elsewhere classified  Muscle weakness (generalized)  Other abnormalities of gait and mobility    Problem List Patient Active Problem List   Diagnosis Date Noted   Pain due to onychomycosis of toenails of both feet 06/17/2020   Parkinson's disease ([facility]) 04/23/2020    Leg swelling 11/26/2019   Chronic venous insufficiency 11/26/2019   Vitamin B12 deficiency 02/06/2018   Sepsis secondary to UTI ([facility]) 01/29/2018   Microcytic anemia 01/29/2018   Renal insufficiency 01/29/2018   Pure hypercholesterolemia 12/24/2014   Diabetes mellitus type 2, controlled, without complications ([facility]) 12/24/2014   Bruit of right carotid artery 12/24/2014   Thyroid disease 07/06/2014   Severe obesity (BMI >= 40) ([facility]) 07/06/2014   Essential hypertension 07/06/2014    [facility] 05/18/2021, 2:33 PM  [facility] [address] [place], [place], [address] Phone: [phone]   Fax:  [phone]  Name: Anna Bernard MRN: [id number] Date of Birth: 03-Jul-1951   Willa Frater, OTR/L [facility] [address] [place], [place]  [address] [phone] phone [phone] 05/18/21 2:33 PM

## 2021-05-19 ENCOUNTER — Telehealth: Payer: Self-pay | Admitting: Neurology

## 2021-05-19 NOTE — Telephone Encounter (Signed)
Called patients husband back to try to help them navigate the [facility] to come up with a couple of possibilities for a PCP they have a few places to call so hopefully they will find one that will work for them

## 2021-05-19 NOTE — Telephone Encounter (Signed)
Pt husband would like a call back from nurse to discuss getting his wife a PCP. Stated he has spoken to nurse about this

## 2021-05-20 ENCOUNTER — Ambulatory Visit: Payer: Medicare Other | Attending: Neurology | Admitting: Physical Therapy

## 2021-05-20 ENCOUNTER — Encounter: Payer: Self-pay | Admitting: Physical Therapy

## 2021-05-20 ENCOUNTER — Other Ambulatory Visit: Payer: Self-pay

## 2021-05-20 ENCOUNTER — Ambulatory Visit: Payer: Medicare Other | Admitting: Occupational Therapy

## 2021-05-20 ENCOUNTER — Encounter: Payer: Self-pay | Admitting: Occupational Therapy

## 2021-05-20 DIAGNOSIS — R2689 Other abnormalities of gait and mobility: Secondary | ICD-10-CM | POA: Diagnosis present

## 2021-05-20 DIAGNOSIS — R2681 Unsteadiness on feet: Secondary | ICD-10-CM

## 2021-05-20 DIAGNOSIS — R293 Abnormal posture: Secondary | ICD-10-CM

## 2021-05-20 DIAGNOSIS — R29898 Other symptoms and signs involving the musculoskeletal system: Secondary | ICD-10-CM

## 2021-05-20 DIAGNOSIS — M6281 Muscle weakness (generalized): Secondary | ICD-10-CM | POA: Diagnosis present

## 2021-05-20 DIAGNOSIS — M25621 Stiffness of right elbow, not elsewhere classified: Secondary | ICD-10-CM | POA: Diagnosis present

## 2021-05-20 DIAGNOSIS — R278 Other lack of coordination: Secondary | ICD-10-CM

## 2021-05-20 DIAGNOSIS — R29818 Other symptoms and signs involving the nervous system: Secondary | ICD-10-CM | POA: Insufficient documentation

## 2021-05-20 DIAGNOSIS — M25611 Stiffness of right shoulder, not elsewhere classified: Secondary | ICD-10-CM | POA: Insufficient documentation

## 2021-05-20 NOTE — Therapy (Signed)
[facility] [address] [place], [place], [address] Phone: [phone]   Fax:  [phone]  Occupational Therapy Treatment  Patient Details  Name: Anna Bernard MRN: [id number] Date of Birth: 07/23/1951 Referring Provider (OT): Dr. Arbutus Leas   Encounter Date: 05/20/2021   OT End of Session - 05/20/21 1108     Visit Number 11    Number of Visits 25    Date for OT Re-Evaluation 07/09/21    Authorization Type [employer] Medicare    Authorization - Visit Number 11    Authorization - Number of Visits 20    Progress Note Due on Visit 10    OT Start Time 1107    OT Stop Time 1145    OT Time Calculation (min) 38 min    Activity Tolerance Patient tolerated treatment well    Behavior During Therapy WFL for tasks assessed/performed;Flat affect             Past Medical History:  Diagnosis Date   Anemia, unspecified    Hyperlipidemia, unspecified    Hypertension    Hypothyroidism, unspecified    Pre-diabetes    Vitamin B12 deficiency     Past Surgical History:  Procedure Laterality Date   ABDOMINAL HYSTERECTOMY     GIVENS CAPSULE STUDY N/A 04/16/2018   Procedure: GIVENS CAPSULE STUDY;  Surgeon: Jeani Hawking, MD;  Location: [facility] 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;  Surgeon: Carmela Rima, MD;  Location: [facility] OR;  Service: Ophthalmology;  Laterality: Right;   PARS PLANA VITRECTOMY Right 09/29/2020   Procedure: PARS PLANA VITRECTOMY WITH 25 GAUGE - ENDOCAUTRY;  Surgeon: Carmela Rima, MD;  Location: [facility] OR;  Service: Ophthalmology;  Laterality: Right;   PHOTOCOAGULATION WITH LASER Right 08/16/2020   Procedure: PHOTOCOAGULATION WITH LASER;  Surgeon: Carmela Rima, MD;  Location: [facility] OR;  Service: Ophthalmology;  Laterality: Right;   PHOTOCOAGULATION WITH LASER Right 09/29/2020   Procedure:  PHOTOCOAGULATION WITH LASER;  Surgeon: Carmela Rima, MD;  Location: [facility] OR;  Service: Ophthalmology;  Laterality: Right;   SHOULDER SURGERY     TONSILLECTOMY     TUBAL LIGATION      There were no vitals filed for this visit.   Subjective Assessment - 05/20/21 1107     Subjective  still have trouble with my R hand    Pertinent History Pt is a 70 y.o. female with recent Parkinson's disease diagnosis.  Pt with PMH that includes:anemia, hyperlipidemia, orthostatic hypotension, HTN, hypothyroidism, Vitamin B12 deficiency, hx of R RTC surgery, leg swelling, arthritis (per pt), obesity    Patient Stated Goals get better    Currently in Pain? Yes    Pain Score 3     Pain Location Hip    Pain Orientation Left    Pain Descriptors / Indicators Aching    Pain Type Chronic pain    Pain Onset More than a month ago    Pain Frequency Intermittent    Aggravating Factors  nothing    Pain Relieving Factors pain meds               PWR! Moves (basic 4) in supine x 10-20 each with min-mod cues For incr movement amplitude (PWR! Step modified to 10 bridges and then steps outs with each LE).  Reviewed bag exercises for simulated ADLs:  pt returned demo each with min cueing for  incr movement amplitude/posture.  Arm bike x69min level 1 for reciprocal movement with cues/target of at least 30rpms for intensity while maintaining movement amplitude/reciprocal movement.   Pt maintained 21-31rpms with min cueing.  Sitting, tossing scarves with each UE incorporating trunk rotation and lateral wt. Shift with cross body and lateral reaching with min cueing for incr movement amplitude.            OT Short Term Goals - 05/18/21 1349       OT SHORT TERM GOAL #1   Title Pt will be independent with PD-specific HEP--check STGs 05/14/21    Time 4    Period Weeks    Status Achieved   PWR! moves supine and coordination activities   Target Date 05/14/21      OT SHORT TERM GOAL #2   Title Pt will  verbalize understanding of adapted strategies to maximize safety and I with ADLs/ IADLs .    Time 4    Period Weeks    Status On-going      OT SHORT TERM GOAL #3   Title Pt will improve R hand coordination for ADLs as shown by improving time on 9-hole peg test by at least 3 secs    Baseline 44.34    Time 4    Period Weeks    Status On-going   41.54     OT SHORT TERM GOAL #4   Title Pt will demonstrate improved ease with dressing as evidenced by  performing  PPT#4 donning/ doffing 18 secs or less    Baseline 25 secs    Time 4    Period Weeks    Status On-going   05/18/21:  38.87sec              OT Long Term Goals - 05/14/21 1304       OT LONG TERM GOAL #1   Title Pt will verbalize understanding of ways to prevent future PD related complications and PD community resources.    Time 12    Period Weeks    Status New      OT LONG TERM GOAL #2   Title Pt will verbalize  understanding of ways to keep thinking skills sharp.    Time 12    Period Weeks    Status New      OT LONG TERM GOAL #3   Title Pt will retrieve a lightweight object from overhead shelf with RUE with -10 elbow extension.    Time 12    Period Weeks    Status New      OT LONG TERM GOAL #4   Title Pt will perform LB dressing consistently mod I.    Time 12    Period Weeks    Status New                   Plan - 05/20/21 1108     Clinical Impression Statement Pt is progressing towards goals with incr movement amplitude with reaching after iniitial cues.  Pt continues to need cueing for calibration/carryover.    OT Occupational Profile and History Problem Focused Assessment - Including review of records relating to presenting problem    Occupational performance deficits (Please refer to evaluation for details): ADL's;IADL's;Leisure;Social Participation    Body Structure / Function / Physical Skills ADL;UE functional use;Balance;Flexibility;FMC;Gait;ROM;Coordination;GMC;Decreased knowledge of  precautions;Decreased knowledge of use of DME;IADL;Dexterity;Strength;Mobility    Rehab Potential Good    OT Frequency 2x / week    OT Duration  12 weeks    OT Treatment/Interventions Self-care/ADL training;Energy conservation;Patient/family education;DME and/or AE instruction;Aquatic Therapy;Balance training;Fluidtherapy;Building services engineer;Therapeutic exercise;Moist Heat;Manual Therapy;Therapeutic activities;Neuromuscular education    Plan continue with large amplitude movements/strategies for ADLs, standing with functional reach, coordination    Consulted and Agree with Plan of Care Patient             Patient will benefit from skilled therapeutic intervention in order to improve the following deficits and impairments:   Body Structure / Function / Physical Skills: ADL, UE functional use, Balance, Flexibility, FMC, Gait, ROM, Coordination, GMC, Decreased knowledge of precautions, Decreased knowledge of use of DME, IADL, Dexterity, Strength, Mobility       Visit Diagnosis: Other symptoms and signs involving the nervous system  Abnormal posture  Other symptoms and signs involving the musculoskeletal system  Stiffness of right shoulder, not elsewhere classified  Other lack of coordination  Unsteadiness on feet  Stiffness of right elbow, not elsewhere classified  Other abnormalities of gait and mobility    Problem List Patient Active Problem List   Diagnosis Date Noted   Pain due to onychomycosis of toenails of both feet 06/17/2020   Parkinson's disease (HCC) 04/23/2020   Leg swelling 11/26/2019   Chronic venous insufficiency 11/26/2019   Vitamin B12 deficiency 02/06/2018   Sepsis secondary to UTI (HCC) 01/29/2018   Microcytic anemia 01/29/2018   Renal insufficiency 01/29/2018   Pure hypercholesterolemia 12/24/2014   Diabetes mellitus type 2, controlled, without complications (HCC) 12/24/2014   Bruit of right carotid artery 12/24/2014   Thyroid disease  07/06/2014   Severe obesity (BMI >= 40) (HCC) 07/06/2014   Essential hypertension 07/06/2014    [facility] 05/20/2021, 12:18 PM  [facility] [address] [place], [place], [address] Phone: [phone]   Fax:  [phone]  Name: Anna Bernard MRN: [id number] Date of Birth: September 07, 1951  Willa Frater, OTR/L [facility] [address] [place], [place]  [address] [phone] phone [phone] 05/20/21 12:18 PM

## 2021-05-20 NOTE — Therapy (Signed)
[facility] [address] [place], [place], [address] Phone: [phone]   Fax:  [phone]  Physical Therapy Treatment/Re-Cert  Patient Details  Name: Anna Bernard MRN: [id number] Date of Birth: 05/19/1951 Referring Provider (PT): Alonza Bogus, DO   Encounter Date: 05/20/2021   PT End of Session - 05/20/21 1329     Visit Number 13    Number of Visits 21    Date for PT Re-Evaluation 06/19/21    Authorization Type UHC Medicare    Progress Note Due on Visit 20    PT Start Time 1017    PT Stop Time 1059    PT Time Calculation (min) 42 min    Equipment Utilized During Treatment Gait belt    Activity Tolerance Patient tolerated treatment well    Behavior During Therapy WFL for tasks assessed/performed;Flat affect             Past Medical History:  Diagnosis Date   Anemia, unspecified    Hyperlipidemia, unspecified    Hypertension    Hypothyroidism, unspecified    Pre-diabetes    Vitamin B12 deficiency     Past Surgical History:  Procedure Laterality Date   ABDOMINAL HYSTERECTOMY     GIVENS CAPSULE STUDY N/A 04/16/2018   Procedure: GIVENS CAPSULE STUDY;  Surgeon: Carol Ada, MD;  Location: [facility];  Service: Endoscopy;  Laterality: N/A;   PARS PLANA VITRECTOMY Right 08/16/2020   Procedure: 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;  Surgeon: Jalene Mullet, MD;  Location: [place];  Service: Ophthalmology;  Laterality: Right;   PARS PLANA VITRECTOMY Right 09/29/2020   Procedure: PARS PLANA VITRECTOMY WITH 25 GAUGE - ENDOCAUTRY;  Surgeon: Jalene Mullet, MD;  Location: [place];  Service: Ophthalmology;  Laterality: Right;   PHOTOCOAGULATION WITH LASER Right 08/16/2020   Procedure: PHOTOCOAGULATION WITH LASER;  Surgeon: Jalene Mullet, MD;  Location: [place];  Service: Ophthalmology;  Laterality: Right;   PHOTOCOAGULATION WITH LASER Right 09/29/2020   Procedure: PHOTOCOAGULATION  WITH LASER;  Surgeon: Jalene Mullet, MD;  Location: [place] [facility];  Service: Ophthalmology;  Laterality: Right;   SHOULDER SURGERY     TONSILLECTOMY     TUBAL LIGATION      There were no vitals filed for this visit.   Subjective Assessment - 05/20/21 1020     Subjective Nothing new, no changes.    Pertinent History Dx with Parkinson's disease (Dr. Carles Collet) 04/2020; PMH of HTN, DM, hypothyroidism, orthostatic hypotension    Limitations Walking    Patient Stated Goals wants to get back on track with an exercise program.    Currently in Pain? Yes    Pain Score 3     Pain Location Hip    Pain Orientation Left    Pain Descriptors / Indicators Aching    Pain Type Chronic pain    Pain Onset More than a month ago    Aggravating Factors  nothing    Pain Relieving Factors aleve                [facility] PT Assessment - 05/20/21 1022       Assessment   Medical Diagnosis Parkinson's Disease    Referring Provider (PT) Alonza Bogus, DO    Onset Date/Surgical Date 03/12/21    Hand Dominance Right      Prior Function   Level of Independence Needs assistance with ADLs;Independent with household mobility with device  [facility] Adult PT Treatment/Exercise - 05/20/21 1022       Transfers   Transfers Sit to Stand;Stand to Sit    Sit to Stand 5: Supervision;Without upper extremity assist    Stand to Sit 5: Supervision;Without upper extremity assist    Comments x5 reps from mat table with hands on knees - cues for incr forward lean, plus additional laps throughout session      Ambulation/Gait   Ambulation/Gait Yes    Ambulation/Gait Assistance 5: Supervision    Ambulation/Gait Assistance Details at start of session with cues for R foot clearance, pt with improved stride length today    Ambulation Distance (Feet) 230 Feet    Assistive device Rollator    Gait Pattern Step-through pattern;Decreased step length - right;Decreased step length - left;Decreased stance  time - right;Decreased stance time - left;Decreased dorsiflexion - right;Decreased dorsiflexion - left;Shuffle;Poor foot clearance - left;Poor foot clearance - right    Ambulation Surface Level;Indoor      Therapeutic Activites    Therapeutic Activities Other Therapeutic Activities    Other Therapeutic Activities verbally reviewed seated/standing exercises and walking program with pt and purpose of each, to incr compliance printed out an exercise chart - goes over which exercises to do for which days, pt appreciative of information and verbalized understanding      Knee/Hip Exercises: Aerobic   Nustep 4 extremities x 6 minutes, Level 1, for flexibility and strengthening, keeps steps/minute > 60. while pt on NuStep, therapist working on exercise chart for pt              Neuro Re-education   Reviewed PWR! Moves in sitting from HEP. Performed at edge of mat table with aerobic step under pt's feet.    PWR! Up for posture, x 10 reps.     PWR! Rock for weightshifting - x10 reps B    Modified trunk rotation with reach and look over shoulder, 2 sets x 5 reps each side.   PWR! Step for step initiation, single step out and in, 10 reps    Initial cues for technique and intensity, pt reporting intensity level throughout at a 7-8/10 level of intensity.  Pt reporting performing seated PWR moves 5x a  week.       PT Education - 05/20/21 1327     Education Details printed out an exercise chart for pt for improved compliance for home, goals to continue to work towards in therapy with re-cert    Person(s) Educated Patient    Methods Explanation;Handout    Comprehension Verbalized understanding              PT Short Term Goals - 05/20/21 1331       PT SHORT TERM GOAL #1   Title ALL STGS = LTGS               PT Long Term Goals - 05/20/21 1331       PT LONG TERM GOAL #1   Title Pt will perform progression of HEP and walking program, including verbalizing plans for optimal  fitness plan upon d/c from PT.  ALL LTGS DUE 06/09/21    Baseline will benefit from review    Time 12    Period Weeks    Status On-going      PT LONG TERM GOAL #2   Title Pt will improve 5x sit<>stand in less than or equal to 18 seconds for improved transfer efficiency and functional strength.    Baseline 21.50 seconds;  20.31 seconds with BUE support from chair on 05/12/21    Time 12    Period Weeks    Status Not Met      PT LONG TERM GOAL #3   Title Pt will improve gait velocity to at least 2.3 ft/sec for improved gait efficiency and safety.    Baseline 1.86 ft/sec with rollator; 15.43 seconds = 2.12 ft/sec on 05/12/21    Time 12    Period Weeks    Status Not Met      PT LONG TERM GOAL #4   Title Pt will improve 2MWT distance with rollator to at least 210' feet for improved gait efficiency and endurance.    Baseline 185' with rollator    Time 12    Period Weeks    Status Achieved      PT LONG TERM GOAL #5   Title Pt and pt's husband will verbalize understanding of local PD resources.    Baseline info provided by OT today, 05/14/21    Time 12    Period Weeks    Status Deferred             Revised/ongoing LTGs for re-cert:      PT Long Term Goals - 05/20/21 1342       PT LONG TERM GOAL #1   Title Pt will perform progression of HEP and walking program, including verbalizing plans for optimal fitness plan upon d/c from PT.  ALL LTGS DUE 06/17/21    Baseline will benefit from review    Time 4    Period Weeks    Status On-going    Target Date 06/17/21      PT LONG TERM GOAL #2   Title Pt will improve 5x sit<>stand in less than or equal to 18 seconds with BUE support for improved transfer efficiency and functional strength.    Baseline 21.50 seconds; 20.31 seconds with BUE support from chair on 05/12/21    Time 4    Period Weeks    Status On-going      PT LONG TERM GOAL #3   Title Pt will improve gait velocity to at least 2.25 ft/sec for improved gait efficiency in  the community and safety.    Baseline 1.86 ft/sec with rollator; 15.43 seconds = 2.12 ft/sec on 05/12/21    Time 4    Period Weeks    Status Revised      PT LONG TERM GOAL #4   Title Pt will improve 2MWT distance with rollator to at least 225' feet for improved gait efficiency and endurance.    Baseline 210' with rollator    Time 4    Period Weeks    Status Revised      PT LONG TERM GOAL #5   Title Pt and pt's husband will verbalize understanding of local PD resources.    Baseline info provided by OT today, 05/14/21, will benefit from review before D/C    Time 4    Period Weeks    Status On-going                Plan - 05/20/21 1339     Clinical Impression Statement LTGs assessed in previous couple of sessions with pt improving 2MWT distance to 210' (previously was 185') with rollator). Pt improved gait speed with rollator to 2.12 ft/sec (previously 1.86 ft/sec). Pt did not meet LTG in regards to 5x sit <> stand time with BUE support. Reviewed seated PWR moves for pt's HEP  with initial cues for intensity. When performing sit <> stands with no UE support, does need cues for incr forward lean. Created an exercise chart for pt that shows which exercises to perform on each day. Will re-cert for an additional 2x week for 4 weeks to continue working on gait quality/endurance, balance, strength, transfers, stairs, and finalizing pt's HEP in order to decr fall risk and improve pt's independence. LTGs updated/revised as appropriate.    Personal Factors and Comorbidities Comorbidity 3+;Age    Comorbidities PMH:  HTN, DM, hypothyroidism, orthostatic hypotension    Examination-Activity Limitations Locomotion Level;Stand;Transfers;Stairs    Examination-Participation Restrictions Community Activity;Other   Plans for travel in retirement   Stability/Clinical Decision Making Evolving/Moderate complexity    Rehab Potential Good    PT Frequency 2x / week    PT Duration 4 weeks    PT  Treatment/Interventions ADLs/Self Care Home Management;DME Instruction;Neuromuscular re-education;Balance training;Therapeutic exercise;Therapeutic activities;Functional mobility training;Gait training;Patient/family education;Manual techniques;Stair training;Orthotic Fit/Training    PT Next Visit Plan check goals and will need re-cert. continue to practice stairs. continue functional BLE strength and balance; continue gait training/endurance with rollator. activities for incr foot clearance. NuStep for endurance/strengthening.    Consulted and Agree with Plan of Care Patient    Family Member Consulted husband, Marden Noble             Patient will benefit from skilled therapeutic intervention in order to improve the following deficits and impairments:  Abnormal gait, Difficulty walking, Decreased balance, Decreased mobility, Decreased strength, Postural dysfunction, Decreased activity tolerance, Decreased endurance, Decreased coordination  Visit Diagnosis: Other symptoms and signs involving the nervous system  Abnormal posture  Unsteadiness on feet  Muscle weakness (generalized)     Problem List Patient Active Problem List   Diagnosis Date Noted   Pain due to onychomycosis of toenails of both feet 06/17/2020   Parkinson's disease ([place]) 04/23/2020   Leg swelling 11/26/2019   Chronic venous insufficiency 11/26/2019   Vitamin B12 deficiency 02/06/2018   Sepsis secondary to UTI ([place]) 01/29/2018   Microcytic anemia 01/29/2018   Renal insufficiency 01/29/2018   Pure hypercholesterolemia 12/24/2014   Diabetes mellitus type 2, controlled, without complications ([place]) 16/38/4536   Bruit of right carotid artery 12/24/2014   Thyroid disease 07/06/2014   Severe obesity (BMI >= 40) ([name]) 07/06/2014   Essential hypertension 07/06/2014    Anna Bernard, PT, DPT  05/20/2021, 1:40 PM  [facility] [address] [place], [place],  [address] Phone: [phone]   Fax:  [phone]  Name: Anna Bernard MRN: [id number] Date of Birth: [birth year]

## 2021-06-01 ENCOUNTER — Encounter: Payer: Self-pay | Admitting: Physical Therapy

## 2021-06-01 ENCOUNTER — Encounter: Payer: Self-pay | Admitting: Occupational Therapy

## 2021-06-01 ENCOUNTER — Other Ambulatory Visit: Payer: Self-pay

## 2021-06-01 ENCOUNTER — Ambulatory Visit: Payer: Medicare Other | Admitting: Physical Therapy

## 2021-06-01 ENCOUNTER — Ambulatory Visit: Payer: Medicare Other | Admitting: Occupational Therapy

## 2021-06-01 DIAGNOSIS — R29818 Other symptoms and signs involving the nervous system: Secondary | ICD-10-CM

## 2021-06-01 DIAGNOSIS — R293 Abnormal posture: Secondary | ICD-10-CM

## 2021-06-01 DIAGNOSIS — M6281 Muscle weakness (generalized): Secondary | ICD-10-CM

## 2021-06-01 DIAGNOSIS — R2689 Other abnormalities of gait and mobility: Secondary | ICD-10-CM

## 2021-06-01 DIAGNOSIS — M25611 Stiffness of right shoulder, not elsewhere classified: Secondary | ICD-10-CM

## 2021-06-01 DIAGNOSIS — M25621 Stiffness of right elbow, not elsewhere classified: Secondary | ICD-10-CM

## 2021-06-01 DIAGNOSIS — R278 Other lack of coordination: Secondary | ICD-10-CM

## 2021-06-01 DIAGNOSIS — R29898 Other symptoms and signs involving the musculoskeletal system: Secondary | ICD-10-CM

## 2021-06-01 NOTE — Therapy (Signed)
[facility] [address] [place], [place], [address] Phone: [phone]   Fax:  [phone]  Occupational Therapy Treatment  Patient Details  Name: Anna Bernard MRN: [id number] Date of Birth: 07/24/1951 Referring Provider (OT): Dr. Arbutus Leas   Encounter Date: 06/01/2021   OT End of Session - 06/01/21 1327     Visit Number 12    Number of Visits 25    Date for OT Re-Evaluation 07/09/21    Authorization Type [employer] Medicare    Authorization - Visit Number 12    Authorization - Number of Visits 20    Progress Note Due on Visit 10    OT Start Time 1325    OT Stop Time 1403    OT Time Calculation (min) 38 min    Activity Tolerance Patient tolerated treatment well    Behavior During Therapy WFL for tasks assessed/performed;Flat affect             Past Medical History:  Diagnosis Date   Anemia, unspecified    Hyperlipidemia, unspecified    Hypertension    Hypothyroidism, unspecified    Pre-diabetes    Vitamin B12 deficiency     Past Surgical History:  Procedure Laterality Date   ABDOMINAL HYSTERECTOMY     GIVENS CAPSULE STUDY N/A 04/16/2018   Procedure: GIVENS CAPSULE STUDY;  Surgeon: Jeani Hawking, MD;  Location: [facility] 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;  Surgeon: Carmela Rima, MD;  Location[facility] OR;  Service: Ophthalmology;  Laterality: Right;   PARS PLANA VITRECTOMY Right 09/29/2020   Procedure: PARS PLANA VITRECTOMY WITH 25 GAUGE - ENDOCAUTRY;  Surgeon: Carmela Rima, MD;  Location: [facility] OR;  Service: Ophthalmology;  Laterality: Right;   PHOTOCOAGULATION WITH LASER Right 08/16/2020   Procedure: PHOTOCOAGULATION WITH LASER;  Surgeon: Carmela Rima, MD;  Location: [facility] OR;  Service: Ophthalmology;  Laterality: Right;   PHOTOCOAGULATION WITH LASER Right 09/29/2020   Procedure:  PHOTOCOAGULATION WITH LASER;  Surgeon: Carmela Rima, MD;  Location: [facility] OR;  Service: Ophthalmology;  Laterality: Right;   SHOULDER SURGERY     TONSILLECTOMY     TUBAL LIGATION      There were no vitals filed for this visit.   Subjective Assessment - 06/01/21 1326     Subjective  pt reports that husband has to tie her shoes.  tired today    Pertinent History Pt is a 70 y.o. female with recent Parkinson's disease diagnosis.  Pt with PMH that includes:anemia, hyperlipidemia, orthostatic hypotension, HTN, hypothyroidism, Vitamin B12 deficiency, hx of R RTC surgery, leg swelling, arthritis (per pt), obesity    Patient Stated Goals get better    Currently in Pain? Yes    Pain Score 2    2-4/10   Pain Location Hip    Pain Orientation Left    Pain Descriptors / Indicators Aching    Pain Type Chronic pain    Pain Onset More than a month ago    Pain Frequency Intermittent    Aggravating Factors  rest, lying on bed    Pain Relieving Factors pain meds             Stretching/activities to assist with LB dressing:  reach ceiling>floor with BUEs with min cueing for positioning and large amplitude, large amplitude stepping to targets with each LE (yoga block and stepstool), with each foot on stepstool reaching  with BUEs to each foot incorporating across body, then alternating lifts with contralateral reach to foot--all with min cueing for incr movement amplitude.    Pt able to tie both shoes using stepstool with min cueing/prompts.  Standing, performing rock and reach with twist to each side with 1UE support and CGA to target for incr movement amplitude with min cueing.  Sitting, sustained multi-directional reaching side to side with each UE incorporating trunk rotation and wt. Shift to targets with min cueing.  PWR! Up with PWR! Hands/flicks with min-mod cueing for posture and R hand.       OT Short Term Goals - 05/18/21 1349       OT SHORT TERM GOAL #1   Title Pt will be  independent with PD-specific HEP--check STGs 05/14/21    Time 4    Period Weeks    Status Achieved   PWR! moves supine and coordination activities   Target Date 05/14/21      OT SHORT TERM GOAL #2   Title Pt will verbalize understanding of adapted strategies to maximize safety and I with ADLs/ IADLs .    Time 4    Period Weeks    Status On-going      OT SHORT TERM GOAL #3   Title Pt will improve R hand coordination for ADLs as shown by improving time on 9-hole peg test by at least 3 secs    Baseline 44.34    Time 4    Period Weeks    Status On-going   41.54     OT SHORT TERM GOAL #4   Title Pt will demonstrate improved ease with dressing as evidenced by  performing  PPT#4 donning/ doffing 18 secs or less    Baseline 25 secs    Time 4    Period Weeks    Status On-going   05/18/21:  38.87sec              OT Long Term Goals - 05/14/21 1304       OT LONG TERM GOAL #1   Title Pt will verbalize understanding of ways to prevent future PD related complications and PD community resources.    Time 12    Period Weeks    Status New      OT LONG TERM GOAL #2   Title Pt will verbalize  understanding of ways to keep thinking skills sharp.    Time 12    Period Weeks    Status New      OT LONG TERM GOAL #3   Title Pt will retrieve a lightweight object from overhead shelf with RUE with -10 elbow extension.    Time 12    Period Weeks    Status New      OT LONG TERM GOAL #4   Title Pt will perform LB dressing consistently mod I.    Time 12    Period Weeks    Status New                   Plan - 06/01/21 1327     Clinical Impression Statement Pt is progressing towards goals with incr movement amplitude with reaching after iniitial cues.  Pt continues to need cueing for calibration/carryover. Pt was able to tie shoes today with use of a stepstool.    OT Occupational Profile and History Problem Focused Assessment - Including review of records relating to presenting  problem    Occupational performance deficits (Please refer to  evaluation for details): ADL's;IADL's;Leisure;Social Participation    Body Structure / Function / Physical Skills ADL;UE functional use;Balance;Flexibility;FMC;Gait;ROM;Coordination;GMC;Decreased knowledge of precautions;Decreased knowledge of use of DME;IADL;Dexterity;Strength;Mobility    Rehab Potential Good    OT Frequency 2x / week    OT Duration 12 weeks    OT Treatment/Interventions Self-care/ADL training;Energy conservation;Patient/family education;DME and/or AE instruction;Aquatic Therapy;Balance training;Fluidtherapy;Building services engineer;Therapeutic exercise;Moist Heat;Manual Therapy;Therapeutic activities;Neuromuscular education    Plan continue with large amplitude movements/strategies for ADLs, standing with functional reach, coordination, stretching to assist with LB dressing    Consulted and Agree with Plan of Care Patient             Patient will benefit from skilled therapeutic intervention in order to improve the following deficits and impairments:   Body Structure / Function / Physical Skills: ADL, UE functional use, Balance, Flexibility, FMC, Gait, ROM, Coordination, GMC, Decreased knowledge of precautions, Decreased knowledge of use of DME, IADL, Dexterity, Strength, Mobility       Visit Diagnosis: Other symptoms and signs involving the nervous system  Abnormal posture  Other symptoms and signs involving the musculoskeletal system  Stiffness of right shoulder, not elsewhere classified  Other lack of coordination  Stiffness of right elbow, not elsewhere classified  Other abnormalities of gait and mobility    Problem List Patient Active Problem List   Diagnosis Date Noted   Pain due to onychomycosis of toenails of both feet 06/17/2020   Parkinson's disease (HCC) 04/23/2020   Leg swelling 11/26/2019   Chronic venous insufficiency 11/26/2019   Vitamin B12 deficiency 02/06/2018   Sepsis  secondary to UTI (HCC) 01/29/2018   Microcytic anemia 01/29/2018   Renal insufficiency 01/29/2018   Pure hypercholesterolemia 12/24/2014   Diabetes mellitus type 2, controlled, without complications (HCC) 12/24/2014   Bruit of right carotid artery 12/24/2014   Thyroid disease 07/06/2014   Severe obesity (BMI >= 40) (HCC) 07/06/2014   Essential hypertension 07/06/2014    Skilar Marcou, OT/L 06/01/2021, 2:21 PM  [facility] [address] [place], [place], [address] Phone: [phone]   Fax:  [phone]  Name: Anna Bernard MRN: [id number] Date of Birth: [birth year]  Willa Frater, OTR/L [facility] [address] [place], [place]  [address] [phone] phone [phone] 06/01/21 2:21 PM

## 2021-06-01 NOTE — Patient Instructions (Signed)
THIS WEEK:  Do your exercises and write them in the chart!  Try to use your foot pedaler at home, 3-5 minutes at a time, 2 times per day.

## 2021-06-02 NOTE — Therapy (Signed)
[facility] [address] [place], [place], [address] Phone: [phone]   Fax:  [phone]  Physical Therapy Treatment  Patient Details  Name: Anna Bernard MRN: [id number] Date of Birth: 1951/04/01 Referring Provider (PT): Kerin Salen, DO   Encounter Date: 06/01/2021   PT End of Session - 06/02/21 1833     Visit Number 14    Number of Visits 21    Date for PT Re-Evaluation 06/19/21    Authorization Type [employer] Medicare    Progress Note Due on Visit 20    PT Start Time 1231    PT Stop Time 1314    PT Time Calculation (min) 43 min    Equipment Utilized During Treatment Gait belt    Activity Tolerance Patient tolerated treatment well    Behavior During Therapy WFL for tasks assessed/performed;Flat affect             Past Medical History:  Diagnosis Date   Anemia, unspecified    Hyperlipidemia, unspecified    Hypertension    Hypothyroidism, unspecified    Pre-diabetes    Vitamin B12 deficiency     Past Surgical History:  Procedure Laterality Date   ABDOMINAL HYSTERECTOMY     GIVENS CAPSULE STUDY N/A 04/16/2018   Procedure: GIVENS CAPSULE STUDY;  Surgeon: Jeani Hawking, MD;  Location: [facility] 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;  Surgeon: Carmela Rima, MD;  Location: [facility] OR;  Service: Ophthalmology;  Laterality: Right;   PARS PLANA VITRECTOMY Right 09/29/2020   Procedure: PARS PLANA VITRECTOMY WITH 25 GAUGE - ENDOCAUTRY;  Surgeon: Carmela Rima, MD;  Location: [facility] OR;  Service: Ophthalmology;  Laterality: Right;   PHOTOCOAGULATION WITH LASER Right 08/16/2020   Procedure: PHOTOCOAGULATION WITH LASER;  Surgeon: Carmela Rima, MD;  Location: [facility] OR;  Service: Ophthalmology;  Laterality: Right;   PHOTOCOAGULATION WITH LASER Right 09/29/2020   Procedure: PHOTOCOAGULATION WITH  LASER;  Surgeon: Carmela Rima, MD;  Location: [facility] OR;  Service: Ophthalmology;  Laterality: Right;   SHOULDER SURGERY     TONSILLECTOMY     TUBAL LIGATION      There were no vitals filed for this visit.   Subjective Assessment - 06/01/21 1236     Subjective No changes, no falls.  My husband makes sure that I'm doing my exercises.    Pertinent History Dx with Parkinson's disease (Dr. Arbutus Leas) 04/2020; PMH of HTN, DM, hypothyroidism, orthostatic hypotension    Limitations Walking    Patient Stated Goals wants to get back on track with an exercise program.    Currently in Pain? Yes    Pain Score 2     Pain Location Hip    Pain Orientation Left    Pain Descriptors / Indicators Aching    Pain Type Chronic pain    Pain Onset More than a month ago    Pain Frequency Intermittent    Aggravating Factors  rest, lying on the bed    Pain Relieving Factors pain medications                               [facility] Adult PT Treatment/Exercise - 06/01/21 1241       Ambulation/Gait   Ambulation/Gait Yes    Ambulation/Gait Assistance 5: Supervision    Ambulation Distance (Feet) 230 Feet  additional 110 ft   Assistive device Rollator    Gait Pattern Step-through pattern;Decreased step length - right;Decreased step length - left;Decreased stance time - right;Decreased stance time - left;Decreased dorsiflexion - right;Decreased dorsiflexion - left;Shuffle;Poor foot clearance - left;Poor foot clearance - right    Ambulation Surface Level;Indoor    Stairs Yes    Stairs Assistance 4: Min guard    Stair Management Technique Two rails;Step to pattern;Forwards;Alternating pattern    Number of Stairs 4   x 3   Height of Stairs 6      Knee/Hip Exercises: Aerobic   Nustep 4 extremities x 6 minutes, Level 2, for flexibility and strengthening, keeps steps/minute > 70. while pt on NuStep, conversation tasks about pt using foot pedaler at home.    Other Aerobic Pt has foot pedaler at home (she  reports it's different than the foot pedaler we have at clinic).  Trialed the foot pedaler in this clinic, and pt has difficulty keeping R foot in the foot strap.  PT assists and pt able to perform about 1 minute at moderate resistance.  REcommended pt try hers at home.                     PT Education - 06/02/21 1832     Education Details Reminder to use exercise chart and to try her foot pedaler at home/report back how that went    Person(s) Educated Patient    Methods Explanation;Handout    Comprehension Verbalized understanding              PT Short Term Goals - 05/20/21 1331       PT SHORT TERM GOAL #1   Title ALL STGS = LTGS               PT Long Term Goals - 05/20/21 1342       PT LONG TERM GOAL #1   Title Pt will perform progression of HEP and walking program, including verbalizing plans for optimal fitness plan upon d/c from PT.  ALL LTGS DUE 06/17/21    Baseline will benefit from review    Time 4    Period Weeks    Status On-going    Target Date 06/17/21      PT LONG TERM GOAL #2   Title Pt will improve 5x sit<>stand in less than or equal to 18 seconds with BUE support for improved transfer efficiency and functional strength.    Baseline 21.50 seconds; 20.31 seconds with BUE support from chair on 05/12/21    Time 4    Period Weeks    Status On-going      PT LONG TERM GOAL #3   Title Pt will improve gait velocity to at least 2.25 ft/sec for improved gait efficiency in the community and safety.    Baseline 1.86 ft/sec with rollator; 15.43 seconds = 2.12 ft/sec on 05/12/21    Time 4    Period Weeks    Status Revised      PT LONG TERM GOAL #4   Title Pt will improve distance with rollator to at least 225' feet for improved gait efficiency and endurance.    Baseline 210' with rollator    Time 4    Period Weeks    Status Revised      PT LONG TERM GOAL #5   Title Pt and pt's husband will verbalize understanding of local PD resources.     Baseline info provided by  OT today, 05/14/21, will benefit from review before D/C    Time 4    Period Weeks    Status On-going                   Plan - 06/02/21 1834     Clinical Impression Statement Pt does not report compliance in using exercise chart given last visit.  Reviewed ex chart and importance of her consistency with perfoming HEP and walking at home.  Pt reports feeling that use of NuStep helps to loosen her up and alleviate stiffness; she has foot pedaler at home; educated in use of foot pedaler at home as a means to manage stiffness and pt willing to try.  Focused also on stairs and gait today, with pt improving stability with stair negotiation with reps.    Personal Factors and Comorbidities Comorbidity 3+;Age    Comorbidities PMH:  HTN, DM, hypothyroidism, orthostatic hypotension    Examination-Activity Limitations Locomotion Level;Stand;Transfers;Stairs    Examination-Participation Restrictions Community Activity;Other   Plans for travel in retirement   Stability/Clinical Decision Making Evolving/Moderate complexity    Rehab Potential Good    PT Frequency 2x / week    PT Duration 4 weeks    PT Treatment/Interventions ADLs/Self Care Home Management;DME Instruction;Neuromuscular re-education;Balance training;Therapeutic exercise;Therapeutic activities;Functional mobility training;Gait training;Patient/family education;Manual techniques;Stair training;Orthotic Fit/Training    PT Next Visit Plan Ask about use of exercise chart to track HEP; ask if she used foot pedaler at home.  Continue to practice stairs. continue functional BLE strength and balance; continue gait training/endurance with rollator. activities for incr foot clearance. NuStep for endurance/strengthening.    Consulted and Agree with Plan of Care Patient    Family Member Consulted husband, [name]             Patient will benefit from skilled therapeutic intervention in order to improve the following  deficits and impairments:  Abnormal gait, Difficulty walking, Decreased balance, Decreased mobility, Decreased strength, Postural dysfunction, Decreased activity tolerance, Decreased endurance, Decreased coordination  Visit Diagnosis: Other abnormalities of gait and mobility  Muscle weakness (generalized)     Problem List Patient Active Problem List   Diagnosis Date Noted   Pain due to onychomycosis of toenails of both feet 06/17/2020   Parkinson's disease (HCC) 04/23/2020   Leg swelling 11/26/2019   Chronic venous insufficiency 11/26/2019   Vitamin B12 deficiency 02/06/2018   Sepsis secondary to UTI (HCC) 01/29/2018   Microcytic anemia 01/29/2018   Renal insufficiency 01/29/2018   Pure hypercholesterolemia 12/24/2014   Diabetes mellitus type 2, controlled, without complications (HCC) 12/24/2014   Bruit of right carotid artery 12/24/2014   Thyroid disease 07/06/2014   Severe obesity (BMI >= 40) (HCC) 07/06/2014   Essential hypertension 07/06/2014    Orma Cheetham W., PT 06/02/2021, 6:38 PM Gean Maidens., PT   [facility] [address] [place], [place], [address] Phone: [phone]   Fax:  [phone]  Name: Anna Bernard MRN: [id number] Date of Birth: 1951/08/12

## 2021-06-08 ENCOUNTER — Other Ambulatory Visit: Payer: Self-pay

## 2021-06-08 ENCOUNTER — Ambulatory Visit: Payer: Medicare Other | Admitting: Occupational Therapy

## 2021-06-08 ENCOUNTER — Ambulatory Visit: Payer: Medicare Other | Admitting: Physical Therapy

## 2021-06-08 ENCOUNTER — Encounter: Payer: Self-pay | Admitting: Occupational Therapy

## 2021-06-08 DIAGNOSIS — R2681 Unsteadiness on feet: Secondary | ICD-10-CM

## 2021-06-08 DIAGNOSIS — R29818 Other symptoms and signs involving the nervous system: Secondary | ICD-10-CM

## 2021-06-08 DIAGNOSIS — R2689 Other abnormalities of gait and mobility: Secondary | ICD-10-CM

## 2021-06-08 DIAGNOSIS — R293 Abnormal posture: Secondary | ICD-10-CM

## 2021-06-08 DIAGNOSIS — M6281 Muscle weakness (generalized): Secondary | ICD-10-CM

## 2021-06-08 DIAGNOSIS — M25621 Stiffness of right elbow, not elsewhere classified: Secondary | ICD-10-CM

## 2021-06-08 DIAGNOSIS — R278 Other lack of coordination: Secondary | ICD-10-CM

## 2021-06-08 DIAGNOSIS — R29898 Other symptoms and signs involving the musculoskeletal system: Secondary | ICD-10-CM

## 2021-06-08 NOTE — Therapy (Signed)
[facility] [address] [place], [place], [address] Phone: [phone]   Fax:  [phone]  Occupational Therapy Treatment  Patient Details  Name: Anna Bernard MRN: [id number] Date of Birth: 05-Oct-1950 Referring Provider (OT): Dr. Arbutus Leas   Encounter Date: 06/08/2021   OT End of Session - 06/08/21 1414     Visit Number 13    Number of Visits 25    Date for OT Re-Evaluation 07/09/21    Authorization Type [employer] Medicare    Authorization - Visit Number 13    Authorization - Number of Visits 20    Progress Note Due on Visit 10    OT Start Time 1319    OT Stop Time 1400    OT Time Calculation (min) 41 min    Activity Tolerance Patient tolerated treatment well    Behavior During Therapy WFL for tasks assessed/performed             Past Medical History:  Diagnosis Date   Anemia, unspecified    Hyperlipidemia, unspecified    Hypertension    Hypothyroidism, unspecified    Pre-diabetes    Vitamin B12 deficiency     Past Surgical History:  Procedure Laterality Date   ABDOMINAL HYSTERECTOMY     GIVENS CAPSULE STUDY N/A 04/16/2018   Procedure: GIVENS CAPSULE STUDY;  Surgeon: Jeani Hawking, MD;  Location: [facility] 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;  Surgeon: Carmela Rima, MD;  Location: [facility] OR;  Service: Ophthalmology;  Laterality: Right;   PARS PLANA VITRECTOMY Right 09/29/2020   Procedure: PARS PLANA VITRECTOMY WITH 25 GAUGE - ENDOCAUTRY;  Surgeon: Carmela Rima, MD;  Location: [facility] OR;  Service: Ophthalmology;  Laterality: Right;   PHOTOCOAGULATION WITH LASER Right 08/16/2020   Procedure: PHOTOCOAGULATION WITH LASER;  Surgeon: Carmela Rima, MD;  Location: [facility] OR;  Service: Ophthalmology;  Laterality: Right;   PHOTOCOAGULATION WITH LASER Right 09/29/2020   Procedure:  PHOTOCOAGULATION WITH LASER;  Surgeon: Carmela Rima, MD;  Location: [facility] OR;  Service: Ophthalmology;  Laterality: Right;   SHOULDER SURGERY     TONSILLECTOMY     TUBAL LIGATION      There were no vitals filed for this visit.   Subjective Assessment - 06/08/21 1320     Subjective  Pt has been able to tie her shoes at home.    Pertinent History Pt is a 70 y.o. female with recent Parkinson's disease diagnosis.  Pt with PMH that includes:anemia, hyperlipidemia, orthostatic hypotension, HTN, hypothyroidism, Vitamin B12 deficiency, hx of R RTC surgery, leg swelling, arthritis (per pt), obesity    Patient Stated Goals get better    Currently in Pain? Yes    Pain Score 2     Pain Orientation Left    Pain Descriptors / Indicators Aching    Pain Type Chronic pain    Pain Onset More than a month ago    Pain Frequency Intermittent    Aggravating Factors  getting in/out of bed    Pain Relieving Factors pain meds              Standing, UE ranger for high range shoulder flex with each UE x2 sets of 20  Sitting, shoulder flex with BUEs with ball with min cueing and facilitation for large amplitude and incr ROM.  Functional ambulation within gym with rollator with min cueing for rocking/big steps  for freezing with initiation.     OT Education - 06/08/21 1416     Education Details Additions to HEP to assist with LB dressing, flexibility, core stability:  see pt instructions + Big floor to Ceiling multi-directional sustained movement  + big functional repetitive movements--sit and reach (handouts provided)    Person(s) Educated Patient    Methods Explanation;Demonstration;Verbal cues;Handout;Tactile cues    Comprehension Verbalized understanding;Returned demonstration;Verbal cues required              OT Short Term Goals - 05/18/21 1349       OT SHORT TERM GOAL #1   Title Pt will be independent with PD-specific HEP--check STGs 05/14/21    Time 4    Period Weeks    Status  Achieved   PWR! moves supine and coordination activities   Target Date 05/14/21      OT SHORT TERM GOAL #2   Title Pt will verbalize understanding of adapted strategies to maximize safety and I with ADLs/ IADLs .    Time 4    Period Weeks    Status On-going      OT SHORT TERM GOAL #3   Title Pt will improve R hand coordination for ADLs as shown by improving time on 9-hole peg test by at least 3 secs    Baseline 44.34    Time 4    Period Weeks    Status On-going   41.54     OT SHORT TERM GOAL #4   Title Pt will demonstrate improved ease with dressing as evidenced by  performing  PPT#4 donning/ doffing 18 secs or less    Baseline 25 secs    Time 4    Period Weeks    Status On-going   05/18/21:  38.87sec              OT Long Term Goals - 05/14/21 1304       OT LONG TERM GOAL #1   Title Pt will verbalize understanding of ways to prevent future PD related complications and PD community resources.    Time 12    Period Weeks    Status New      OT LONG TERM GOAL #2   Title Pt will verbalize  understanding of ways to keep thinking skills sharp.    Time 12    Period Weeks    Status New      OT LONG TERM GOAL #3   Title Pt will retrieve a lightweight object from overhead shelf with RUE with -10 elbow extension.    Time 12    Period Weeks    Status New      OT LONG TERM GOAL #4   Title Pt will perform LB dressing consistently mod I.    Time 12    Period Weeks    Status New                   Plan - 06/08/21 1415     Clinical Impression Statement Pt is progressing towards goals with improving ability to tie shoes and incr flexibility for reaching LEs.  Pt continues to demo difficulty with overhead reaching with BUEs that appears to be related to weakness and bradykinesia.  Pt does better with AAROM/closed chain shoulder movement.    OT Occupational Profile and History Problem Focused Assessment - Including review of records relating to presenting problem     Occupational performance deficits (Please refer to evaluation for details): ADL's;IADL's;Leisure;Social Participation  Body Structure / Function / Physical Skills ADL;UE functional use;Balance;Flexibility;FMC;Gait;ROM;Coordination;GMC;Decreased knowledge of precautions;Decreased knowledge of use of DME;IADL;Dexterity;Strength;Mobility    Rehab Potential Good    OT Frequency 2x / week    OT Duration 12 weeks    OT Treatment/Interventions Self-care/ADL training;Energy conservation;Patient/family education;DME and/or AE instruction;Aquatic Therapy;Balance training;Fluidtherapy;Building services engineer;Therapeutic exercise;Moist Heat;Manual Therapy;Therapeutic activities;Neuromuscular education    Plan continue with large amplitude movements/strategies for ADLs, standing with functional reach, coordination, stretching to assist with LB dressing    Consulted and Agree with Plan of Care Patient             Patient will benefit from skilled therapeutic intervention in order to improve the following deficits and impairments:   Body Structure / Function / Physical Skills: ADL, UE functional use, Balance, Flexibility, FMC, Gait, ROM, Coordination, GMC, Decreased knowledge of precautions, Decreased knowledge of use of DME, IADL, Dexterity, Strength, Mobility       Visit Diagnosis: Other symptoms and signs involving the nervous system  Abnormal posture  Other symptoms and signs involving the musculoskeletal system  Other lack of coordination  Stiffness of right elbow, not elsewhere classified  Other abnormalities of gait and mobility  Unsteadiness on feet    Problem List Patient Active Problem List   Diagnosis Date Noted   Pain due to onychomycosis of toenails of both feet 06/17/2020   Parkinson's disease (HCC) 04/23/2020   Leg swelling 11/26/2019   Chronic venous insufficiency 11/26/2019   Vitamin B12 deficiency 02/06/2018   Sepsis secondary to UTI (HCC) 01/29/2018    Microcytic anemia 01/29/2018   Renal insufficiency 01/29/2018   Pure hypercholesterolemia 12/24/2014   Diabetes mellitus type 2, controlled, without complications (HCC) 12/24/2014   Bruit of right carotid artery 12/24/2014   Thyroid disease 07/06/2014   Severe obesity (BMI >= 40) (HCC) 07/06/2014   Essential hypertension 07/06/2014    Glora Hulgan, OT/L 06/08/2021, 2:27 PM  [facility] [address] [place], [place], [address] Phone: [phone]   Fax:  [phone]  Name: Anna Bernard MRN: [id number] Date of Birth: 1950/12/07   Willa Frater, OTR/L [facility] [address] [place], [place]  [address] [phone] phone [phone] 06/08/21 2:27 PM

## 2021-06-08 NOTE — Patient Instructions (Addendum)
      Sit with feet apart and stepstool in front of you.  Pick up each foot by bending knee and place on stepstool 1 at a time (think marching to put foot on stepstool).  X20 (10 with each foot).  2.  Sit with feet apart.  Lift up right foot while you reach to calf with your left hand.  Then lift left foot while reaching to calf with right hand.  Make sure to lean forward not backward with reaching.  Repeat 10x to each side.

## 2021-06-09 NOTE — Therapy (Signed)
[facility] [address] [place], [place], [address] Phone: [phone]   Fax:  [phone]  Physical Therapy Treatment  Patient Details  Name: Anna Bernard MRN: [id number] Date of Birth: 25-Oct-1950 Referring Provider (PT): Kerin Salen, DO   Encounter Date: 06/08/2021   PT End of Session - 06/08/21 1308     Visit Number 15    Number of Visits 21    Date for PT Re-Evaluation 06/19/21    Authorization Type [employer] Medicare    Progress Note Due on Visit 20    PT Start Time 1230    PT Stop Time 1308   Pt has to use restroom at end of session, then goes to OT   PT Time Calculation (min) 38 min    Equipment Utilized During Treatment Gait belt    Activity Tolerance Patient tolerated treatment well    Behavior During Therapy WFL for tasks assessed/performed;Flat affect             Past Medical History:  Diagnosis Date   Anemia, unspecified    Hyperlipidemia, unspecified    Hypertension    Hypothyroidism, unspecified    Pre-diabetes    Vitamin B12 deficiency     Past Surgical History:  Procedure Laterality Date   ABDOMINAL HYSTERECTOMY     GIVENS CAPSULE STUDY N/A 04/16/2018   Procedure: GIVENS CAPSULE STUDY;  Surgeon: Jeani Hawking, MD;  Location: [facility] 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;  Surgeon: Carmela Rima, MD;  Location: [facility] OR;  Service: Ophthalmology;  Laterality: Right;   PARS PLANA VITRECTOMY Right 09/29/2020   Procedure: PARS PLANA VITRECTOMY WITH 25 GAUGE - ENDOCAUTRY;  Surgeon: Carmela Rima, MD;  Location: [facility] OR;  Service: Ophthalmology;  Laterality: Right;   PHOTOCOAGULATION WITH LASER Right 08/16/2020   Procedure: PHOTOCOAGULATION WITH LASER;  Surgeon: Carmela Rima, MD;  Location: [facility] OR;  Service: Ophthalmology;  Laterality: Right;   PHOTOCOAGULATION WITH  LASER Right 09/29/2020   Procedure: PHOTOCOAGULATION WITH LASER;  Surgeon: Carmela Rima, MD;  Location: [facility] OR;  Service: Ophthalmology;  Laterality: Right;   SHOULDER SURGERY     TONSILLECTOMY     TUBAL LIGATION      There were no vitals filed for this visit.   Subjective Assessment - 06/08/21 1235     Subjective Didn't use the foot pedaler at home over the weekend.  Did a few exercises, but didn't track them.    Patient is accompained by: Family member   husband at beginning of session   Pertinent History Dx with Parkinson's disease (Dr. Arbutus Leas) 04/2020; PMH of HTN, DM, hypothyroidism, orthostatic hypotension    Limitations Walking    Patient Stated Goals wants to get back on track with an exercise program.    Currently in Pain? Yes    Pain Score 2     Pain Location Hip    Pain Orientation Left    Pain Descriptors / Indicators Aching    Pain Type Chronic pain    Pain Onset More than a month ago    Pain Frequency Intermittent    Aggravating Factors  getting in and out of bed    Pain Relieving Factors pain meds  [facility] Adult PT Treatment/Exercise - 06/08/21 1230       Transfers   Transfers Sit to Stand;Stand to Sit    Sit to Stand 5: Supervision    Sit to Stand Details Tactile cues for sequencing;Verbal cues for sequencing;Verbal cues for technique    Sit to Stand Details (indicate cue type and reason) Cues for hand placement    Stand to Sit 5: Supervision    Comments Upon standing, cues for tall posture, lateral weightshifting prior to initiating gait with BIG step      Ambulation/Gait   Ambulation/Gait Yes    Ambulation/Gait Assistance 5: Supervision    Ambulation Distance (Feet) 80 Feet   115; 50 ft x 2   Assistive device Rollator    Gait Pattern Step-through pattern;Decreased step length - right;Decreased step length - left;Decreased stance time - right;Decreased stance time - left;Decreased dorsiflexion - right;Decreased  dorsiflexion - left;Shuffle;Poor foot clearance - left;Poor foot clearance - right    Ambulation Surface Level;Indoor    Gait Comments Pt starts with shuffling pattern with decreased step length, able to increase step length and foot clearance with initial cues.      Self-Care   Self-Care Other Self-Care Comments    Other Self-Care Comments  Discussed at beginning of session, with husband present, importance of consistency of performing HEP.  Showed husband tracking chart for PT exercises (and made extra copies).  Discussed trying to perform walking everyday, plus 1-2 additional sets of PT/OT exercises each day.  Husband asks about pt's short, shallow breathing and states he is concerned about this and this is one reason he doesn't push exercise.  Encouraged them to continue to try to find PCP for a thorough exam and talk to them about this.  O2 sats 100%, HR 95 prior to gait, 99% and HR 105 after gait.  Demonstrated and cued pt for pursed lip breathing to help with deep breaths and slow breathing pattern.      Knee/Hip Exercises: Standing   Hip Flexion Stengthening;Right;Left;Knee bent;Knee straight;2 sets;5 reps    Hip Flexion Limitations forward hip flexion with knee straight-taps to ground    Hip Abduction Stengthening;Right;Left;Knee straight;2 sets;5 reps    Abduction Limitations taps to ground    Hip Extension Stengthening;Right;Left;Knee straight;2 sets;5 reps    Extension Limitations taps to ground                     PT Education - 06/09/21 0635     Education Details See self care discussion with pt/husband regarding HEP chart    Person(s) Educated Patient;Spouse    Methods Explanation;Handout    Comprehension Verbalized understanding              PT Short Term Goals - 05/20/21 1331       PT SHORT TERM GOAL #1   Title ALL STGS = LTGS               PT Long Term Goals - 05/20/21 1342       PT LONG TERM GOAL #1   Title Pt will perform progression of  HEP and walking program, including verbalizing plans for optimal fitness plan upon d/c from PT.  ALL LTGS DUE 06/17/21    Baseline will benefit from review    Time 4    Period Weeks    Status On-going    Target Date 06/17/21      PT LONG TERM GOAL #2   Title Pt will improve  5x sit<>stand in less than or equal to 18 seconds with BUE support for improved transfer efficiency and functional strength.    Baseline 21.50 seconds; 20.31 seconds with BUE support from chair on 05/12/21    Time 4    Period Weeks    Status On-going      PT LONG TERM GOAL #3   Title Pt will improve gait velocity to at least 2.25 ft/sec for improved gait efficiency in the community and safety.    Baseline 1.86 ft/sec with rollator; 15.43 seconds = 2.12 ft/sec on 05/12/21    Time 4    Period Weeks    Status Revised      PT LONG TERM GOAL #4   Title Pt will improve distance with rollator to at least 225' feet for improved gait efficiency and endurance.    Baseline 210' with rollator    Time 4    Period Weeks    Status Revised      PT LONG TERM GOAL #5   Title Pt and pt's husband will verbalize understanding of local PD resources.    Baseline info provided by OT today, 05/14/21, will benefit from review before D/C    Time 4    Period Weeks    Status On-going                   Plan - 06/09/21 0636     Clinical Impression Statement Continued to focus on cues for initiating gait with increased step length.  Upon first standing, pt tends to have festinating episode and then starts gait with shuffling pattern.  PT attempts to have pt self-correct, but PT provides cues each time upon standing and initiation of gait.  Educated husband today in importance of consistent HEP performance at home.    Personal Factors and Comorbidities Comorbidity 3+;Age    Comorbidities PMH:  HTN, DM, hypothyroidism, orthostatic hypotension    Examination-Activity Limitations Locomotion Level;Stand;Transfers;Stairs     Examination-Participation Restrictions Community Activity;Other   Plans for travel in retirement   Stability/Clinical Decision Making Evolving/Moderate complexity    Rehab Potential Good    PT Frequency 2x / week    PT Duration 4 weeks    PT Treatment/Interventions ADLs/Self Care Home Management;DME Instruction;Neuromuscular re-education;Balance training;Therapeutic exercise;Therapeutic activities;Functional mobility training;Gait training;Patient/family education;Manual techniques;Stair training;Orthotic Fit/Training    PT Next Visit Plan Ask again about use of exercise chart to track HEP; ask if she used foot pedaler at home.  Continue to practice stairs. continue functional BLE strength and balance; continue gait training/endurance with rollator. activities for incr foot clearance. NuStep for endurance/strengthening.  Education to husband on cues for initiation of gait to lessen festination and shuffling.  Pt's POC ends next week-look at/discuss plans for discharge    Consulted and Agree with Plan of Care Patient;Family member/caregiver    Family Member Consulted husband, [name]             Patient will benefit from skilled therapeutic intervention in order to improve the following deficits and impairments:  Abnormal gait, Difficulty walking, Decreased balance, Decreased mobility, Decreased strength, Postural dysfunction, Decreased activity tolerance, Decreased endurance, Decreased coordination  Visit Diagnosis: Other abnormalities of gait and mobility  Muscle weakness (generalized)  Unsteadiness on feet  Other symptoms and signs involving the nervous system     Problem List Patient Active Problem List   Diagnosis Date Noted   Pain due to onychomycosis of toenails of both feet 06/17/2020   Parkinson's disease (HCC)  04/23/2020   Leg swelling 11/26/2019   Chronic venous insufficiency 11/26/2019   Vitamin B12 deficiency 02/06/2018   Sepsis secondary to UTI (HCC) 01/29/2018    Microcytic anemia 01/29/2018   Renal insufficiency 01/29/2018   Pure hypercholesterolemia 12/24/2014   Diabetes mellitus type 2, controlled, without complications (HCC) 12/24/2014   Bruit of right carotid artery 12/24/2014   Thyroid disease 07/06/2014   Severe obesity (BMI >= 40) (HCC) 07/06/2014   Essential hypertension 07/06/2014    Daishon Chui W., PT 06/09/2021, 6:41 AM Gean Maidens., PT  [facility] [address] [place], [place], [address] Phone: [phone]   Fax:  [phone]  Name: Anna Bernard MRN: [id number] Date of Birth: 12-14-1950

## 2021-06-10 NOTE — Progress Notes (Signed)
Assessment/Plan:   1.  Parkinsons Disease  -Take carbidopa/levodopa CR, 2 at 7am, 2 at 11 am and 1 at 4pm.    -We will go ahead and schedule a levodopa challenge.  It is very difficult to tell if levodopa is helping and if she is actually having side effects.  She just tells me that she "feels bad" and I am not sure that is all from levodopa.  Hopefully, the levodopa challenge test will offer some clarity around this.  2.  Hypertension  -Monitoring as is on multiple antihypertensives.  3.  Urinary frequency  -on lasix.  Will need to f/u prescribing physician.  She asked today who that was, as she had not yet followed up since last visit.   Subjective:   Anna Bernard was seen today in follow up for Parkinsons disease.  My previous records were reviewed prior to todays visit as well as outside records available to me. Husband accompanies the history.  Changed patient last visit to the immediate release because she stated that she felt "bad."  She had difficulty describing exactly what felt that her how she felt bad.  Nonetheless, we changed her medication to the CR and increased the number of tablets.  She still says that she still doesn't "feel good."  She says she cannot state what that means but husband thinks she is just drowsy.    We also sent in an order for physical therapy.  She has attended those therapy sessions and notes are reviewed.  Pt states that she does "fine" except she may not be motivated to go.  No falls.  Using walker at home.    Current prescribed movement disorder medications: Carbidopa/levodopa 25/100 CR, 2 tablets at 7 AM/2 tablets at 11 AM/1 tablet at 4 PM (increased last visit and changed from the immediate release)  PREVIOUS MEDICATIONS: Sinemet  ALLERGIES:  No Known Allergies  CURRENT MEDICATIONS:  Outpatient Encounter Medications as of 06/14/2021  Medication Sig   aspirin 81 MG tablet Take 81 mg by mouth daily.   Carbidopa-Levodopa ER (SINEMET CR)  25-100 MG tablet controlled release 2 tablets at 7am, 2 tablets at 11 am and 1 at 4pm   docusate sodium (COLACE) 100 MG capsule Take 1 capsule (100 mg total) by mouth 2 (two) times daily as needed for mild constipation.   furosemide (LASIX) 20 MG tablet TAKE 1 TABLET BY MOUTH TWICE A DAY (Patient taking differently: Take 40 mg by mouth 2 (two) times daily.)   KLOR-CON M20 20 MEQ tablet TAKE 1 TABLET BY MOUTH TWICE A DAY (Patient taking differently: Take 20 mEq by mouth daily.)   levothyroxine (SYNTHROID) 150 MCG tablet Take 1 tablet (150 mcg total) by mouth daily.   Multiple Vitamin (MULTIVITAMIN WITH MINERALS) TABS Take 1 tablet by mouth daily.   naproxen sodium (ALEVE) 220 MG tablet Take 220 mg by mouth 2 (two) times daily as needed (pain).   nystatin (MYCOSTATIN/NYSTOP) powder Apply 1 application topically 2 (two) times daily. (Patient taking differently: Apply 1 application topically every other day.)   ofloxacin (OCUFLOX) 0.3 % ophthalmic solution Place 1 drop into the right eye 4 (four) times daily.   olmesartan (BENICAR) 20 MG tablet Take 1 tablet (20 mg total) by mouth daily.   polyethylene glycol powder (GLYCOLAX/MIRALAX) 17 GM/SCOOP powder Take 17 g by mouth 2 (two) times daily as needed for mild constipation or moderate constipation.   prednisoLONE acetate (PRED FORTE) 1 % ophthalmic suspension SMARTSIG:In Eye(s)  rosuvastatin (CRESTOR) 10 MG tablet Take 1 tablet (10 mg total) by mouth daily.   trimethoprim (TRIMPEX) 100 MG tablet Take 100 mg by mouth daily.    No facility-administered encounter medications on file as of 06/14/2021.    Objective:   PHYSICAL EXAMINATION:    VITALS:   Vitals:   06/14/21 0852  BP: 126/79  Pulse: 80  SpO2: 98%  Weight: 172 lb 3.2 oz (78.1 kg)  Height: 5\' 1"  (1.549 m)     GEN:  The patient appears stated age and is in NAD. HEENT:  Normocephalic, atraumatic.  The mucous membranes are moist. The superficial temporal arteries are without ropiness  or tenderness. CV:  RRR Lungs:  CTAB Neck/HEME:  There are no carotid bruits bilaterally.  Neurological examination:  Orientation: The patient is alert and oriented x3. Cranial nerves: There is good facial symmetry with facial hypomimia. The speech is fluent and clear. Soft palate rises symmetrically and there is no tongue deviation. Hearing is intact to conversational tone. Sensation: Sensation is intact to light touch throughout Motor: Strength is at least antigravity x4.  Movement examination: Tone: There is nl tone in the ue/le Abnormal movements: none Coordination:  There is decremation with RAM's, with any form of RAMS, including alternating supination and pronation of the forearm, hand opening and closing, finger taps, heel taps and toe taps, mostly on the right. Gait and Station: The patient has difficulty arising out of a deep-seated chair without the use of the hands.  She is assisted by the examiner.  She does drag the right leg.  She ambulates slow with short steps with a walker.  The patient's stride length is sig decreased and drags the R leg.    I have reviewed and interpreted the following labs independently    Chemistry      Component Value Date/Time   NA 138 09/29/2020 1722   NA 142 03/30/2020 1217   K 4.6 09/29/2020 1722   CL 105 09/29/2020 1722   CO2 23 09/29/2020 1722   BUN 15 09/29/2020 1722   BUN 18 03/30/2020 1217   CREATININE 0.63 09/29/2020 1722   CREATININE 0.52 04/21/2013 1526   GLU 133 08/27/2012 0000      Component Value Date/Time   CALCIUM 8.8 (L) 09/29/2020 1722   ALKPHOS 69 08/15/2020 1000   AST 19 08/15/2020 1000   ALT 6 08/15/2020 1000   BILITOT 0.6 08/15/2020 1000   BILITOT 0.4 03/30/2020 1217       Lab Results  Component Value Date   WBC 5.3 09/29/2020   HGB 9.2 (L) 09/29/2020   HCT 28.8 (L) 09/29/2020   MCV 74.2 (L) 09/29/2020   PLT 315 09/29/2020    Lab Results  Component Value Date   TSH 1.390 09/10/2020     Total  time spent on today's visit was 33 minutes, including both face-to-face time and nonface-to-face time.  Time included that spent on review of records (prior notes available to me/labs/imaging if pertinent), discussing treatment and goals, answering patient's questions and coordinating care.  Cc:  Pcp, No

## 2021-06-11 ENCOUNTER — Other Ambulatory Visit: Payer: Self-pay

## 2021-06-11 ENCOUNTER — Ambulatory Visit: Payer: Medicare Other | Admitting: Occupational Therapy

## 2021-06-11 ENCOUNTER — Encounter: Payer: Self-pay | Admitting: Physical Therapy

## 2021-06-11 ENCOUNTER — Ambulatory Visit: Payer: Medicare Other | Admitting: Physical Therapy

## 2021-06-11 DIAGNOSIS — R29818 Other symptoms and signs involving the nervous system: Secondary | ICD-10-CM

## 2021-06-11 DIAGNOSIS — R2689 Other abnormalities of gait and mobility: Secondary | ICD-10-CM

## 2021-06-11 DIAGNOSIS — M6281 Muscle weakness (generalized): Secondary | ICD-10-CM

## 2021-06-11 DIAGNOSIS — R2681 Unsteadiness on feet: Secondary | ICD-10-CM

## 2021-06-11 DIAGNOSIS — M25611 Stiffness of right shoulder, not elsewhere classified: Secondary | ICD-10-CM

## 2021-06-11 DIAGNOSIS — R278 Other lack of coordination: Secondary | ICD-10-CM

## 2021-06-11 DIAGNOSIS — R293 Abnormal posture: Secondary | ICD-10-CM

## 2021-06-11 DIAGNOSIS — M25621 Stiffness of right elbow, not elsewhere classified: Secondary | ICD-10-CM

## 2021-06-11 DIAGNOSIS — R29898 Other symptoms and signs involving the musculoskeletal system: Secondary | ICD-10-CM

## 2021-06-11 NOTE — Therapy (Signed)
[facility] [address] [place], [place], [address] Phone: [phone]   Fax:  [phone]  Physical Therapy Treatment  Patient Details  Name: CARNELLA FRYMAN MRN: [id number] Date of Birth: October 26, 1950 Referring Provider (PT): Kerin Salen, DO   Encounter Date: 06/11/2021   PT End of Session - 06/11/21 1519     Visit Number 16    Number of Visits 21    Date for PT Re-Evaluation 06/19/21    Authorization Type UHC Medicare    Progress Note Due on Visit 20    PT Start Time 1320    PT Stop Time 1358    PT Time Calculation (min) 38 min    Equipment Utilized During Treatment Gait belt    Activity Tolerance Patient tolerated treatment well;Patient limited by fatigue;Patient limited by pain;No increased pain   initial pain increased in L hip, but improves in session, per report.   Behavior During Therapy WFL for tasks assessed/performed;Flat affect             Past Medical History:  Diagnosis Date   Anemia, unspecified    Hyperlipidemia, unspecified    Hypertension    Hypothyroidism, unspecified    Pre-diabetes    Vitamin B12 deficiency     Past Surgical History:  Procedure Laterality Date   ABDOMINAL HYSTERECTOMY     GIVENS CAPSULE STUDY N/A 04/16/2018   Procedure: GIVENS CAPSULE STUDY;  Surgeon: Jeani Hawking, MD;  Location: 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 OR;  Service: Ophthalmology;  Laterality: Right;   PARS PLANA VITRECTOMY Right 09/29/2020   Procedure: PARS PLANA VITRECTOMY WITH 25 GAUGE - ENDOCAUTRY;  Surgeon: Carmela Rima, MD;  Location: [facility] OR;  Service: Ophthalmology;  Laterality: Right;   PHOTOCOAGULATION WITH LASER Right 08/16/2020   Procedure: PHOTOCOAGULATION WITH LASER;  Surgeon: Carmela Rima, MD;   Location: [facility] OR;  Service: Ophthalmology;  Laterality: Right;   PHOTOCOAGULATION WITH LASER Right 09/29/2020   Procedure: PHOTOCOAGULATION WITH LASER;  Surgeon: Carmela Rima, MD;  Location: [facility] OR;  Service: Ophthalmology;  Laterality: Right;   SHOULDER SURGERY     TONSILLECTOMY     TUBAL LIGATION      There were no vitals filed for this visit.   Subjective Assessment - 06/11/21 1325     Subjective Really hurting more today.  Didn't take my medication yesterday; couldn't find it.    Patient is accompained by: --    Pertinent History Dx with Parkinson's disease (Dr. Arbutus Leas) 04/2020; PMH of HTN, DM, hypothyroidism, orthostatic hypotension    Limitations Walking    Patient Stated Goals wants to get back on track with an exercise program.    Currently in Pain? Yes    Pain Score 5     Pain Location Hip    Pain Orientation Left    Pain Descriptors / Indicators Aching    Pain Type Chronic pain    Pain Onset More than a month ago    Pain Frequency Intermittent    Aggravating Factors  not taking medication    Pain Relieving Factors pain meds                               [facility] Adult PT Treatment/Exercise - 06/11/21 0001  Transfers   Transfers Sit to Stand;Stand to Sit    Sit to Stand 5: Supervision    Stand to Sit 5: Supervision    Number of Reps Other reps (comment)   5 reps throughout session   Comments Upon standing, cues for tall posture, lateral weightshifting prior to initiating gait with BIG step      Ambulation/Gait   Ambulation/Gait Yes    Ambulation/Gait Assistance 5: Supervision    Ambulation Distance (Feet) 175 Feet   100   Assistive device Rollator    Gait Pattern Step-through pattern;Decreased step length - right;Decreased step length - left;Decreased stance time - right;Decreased stance time - left;Decreased dorsiflexion - right;Decreased dorsiflexion - left;Shuffle;Poor foot clearance - left;Poor foot clearance - right    Ambulation Surface  Level;Indoor    Stairs Yes    Stairs Assistance 4: Min guard    Stair Management Technique Two rails;Step to pattern;Forwards;Alternating pattern   Step-to pattern descending, cues to lead with RLE descending.   Number of Stairs 4   x 2   Height of Stairs 6      Therapeutic Activites    Therapeutic Activities Other Therapeutic Activities    Other Therapeutic Activities Scooting edge of mat, forward, with cues for rocking, then rock and scoot one hip at a time.  Performed at low mat in Rm 1, x 2 prior to sit>stand.      Knee/Hip Exercises: Supine   Bridges Strengthening;Both;2 sets;5 reps    Other Supine Knee/Hip Exercises Hooklying trunk rotation rocking 10 reps, then trunk rotation rock/stretch, 15 sec, 5 reps each side.  Hooklying marching x 15 reps each leg.                       PT Short Term Goals - 05/20/21 1331       PT SHORT TERM GOAL #1   Title ALL STGS = LTGS               PT Long Term Goals - 05/20/21 1342       PT LONG TERM GOAL #1   Title Pt will perform progression of HEP and walking program, including verbalizing plans for optimal fitness plan upon d/c from PT.  ALL LTGS DUE 06/17/21    Baseline will benefit from review    Time 4    Period Weeks    Status On-going    Target Date 06/17/21      PT LONG TERM GOAL #2   Title Pt will improve 5x sit<>stand in less than or equal to 18 seconds with BUE support for improved transfer efficiency and functional strength.    Baseline 21.50 seconds; 20.31 seconds with BUE support from chair on 05/12/21    Time 4    Period Weeks    Status On-going      PT LONG TERM GOAL #3   Title Pt will improve gait velocity to at least 2.25 ft/sec for improved gait efficiency in the community and safety.    Baseline 1.86 ft/sec with rollator; 15.43 seconds = 2.12 ft/sec on 05/12/21    Time 4    Period Weeks    Status Revised      PT LONG TERM GOAL #4   Title Pt will improve distance with rollator to at least  225' feet for improved gait efficiency and endurance.    Baseline 210' with rollator    Time 4    Period Weeks    Status  Revised      PT LONG TERM GOAL #5   Title Pt and pt's husband will verbalize understanding of local PD resources.    Baseline info provided by OT today, 05/14/21, will benefit from review before D/C    Time 4    Period Weeks    Status On-going                   Plan - 06/11/21 1520     Clinical Impression Statement Pt has increased pain in L hip today (couldn't find medication yesterday and was unable to take); therefore, pt was limited today with standing and gait activities due to pain.  With gentle lower extremity rocking and hip flexibility in supine, pt able to move better with sit<>stand, scooting, and does not report any increase in pain.  She continues to respond best when consistent cues are given (to initiate standing and with gait for longer steps).  She will continue to benefit from skilled PT to work towards LTGs.    Personal Factors and Comorbidities Comorbidity 3+;Age    Comorbidities PMH:  HTN, DM, hypothyroidism, orthostatic hypotension    Examination-Activity Limitations Locomotion Level;Stand;Transfers;Stairs    Examination-Participation Restrictions Community Activity;Other   Plans for travel in retirement   Stability/Clinical Decision Making Evolving/Moderate complexity    Rehab Potential Good    PT Frequency 2x / week    PT Duration 4 weeks    PT Treatment/Interventions ADLs/Self Care Home Management;DME Instruction;Neuromuscular re-education;Balance training;Therapeutic exercise;Therapeutic activities;Functional mobility training;Gait training;Patient/family education;Manual techniques;Stair training;Orthotic Fit/Training    PT Next Visit Plan Ask again about use of exercise chart to track HEP; ask if she used foot pedaler at home.  Check LTGs by next week's end, as that is the end of current POC.  Likely plan for d/c.  Work on balance,  fucntional strength, imrpoved foot clearance; review HEP.    Consulted and Agree with Plan of Care Patient             Patient will benefit from skilled therapeutic intervention in order to improve the following deficits and impairments:  Abnormal gait, Difficulty walking, Decreased balance, Decreased mobility, Decreased strength, Postural dysfunction, Decreased activity tolerance, Decreased endurance, Decreased coordination  Visit Diagnosis: Abnormal posture  Muscle weakness (generalized)  Unsteadiness on feet  Other abnormalities of gait and mobility     Problem List Patient Active Problem List   Diagnosis Date Noted   Pain due to onychomycosis of toenails of both feet 06/17/2020   Parkinson's disease (HCC) 04/23/2020   Leg swelling 11/26/2019   Chronic venous insufficiency 11/26/2019   Vitamin B12 deficiency 02/06/2018   Sepsis secondary to UTI (HCC) 01/29/2018   Microcytic anemia 01/29/2018   Renal insufficiency 01/29/2018   Pure hypercholesterolemia 12/24/2014   Diabetes mellitus type 2, controlled, without complications (HCC) 12/24/2014   Bruit of right carotid artery 12/24/2014   Thyroid disease 07/06/2014   Severe obesity (BMI >= 40) (HCC) 07/06/2014   Essential hypertension 07/06/2014    Farrah Skoda W., PT 06/11/2021, 3:25 PM  [facility] [address] [place], [place], [address] Phone: [phone]   Fax:  [phone]  Name: MARQUIA COSTELLO MRN: [id number] Date of Birth: [birth year]

## 2021-06-11 NOTE — Therapy (Signed)
[facility] [address] [place], [place], [address] Phone: [phone]   Fax:  [phone]  Occupational Therapy Treatment  Patient Details  Name: Anna Bernard MRN: [id number] Date of Birth: 02-28-1951 Referring Provider (OT): Dr. Arbutus Leas   Encounter Date: 06/11/2021   OT End of Session - 06/11/21 1405     Visit Number 14    Number of Visits 25    Date for OT Re-Evaluation 07/09/21    Authorization Type UHC Medicare    Authorization - Visit Number 14    Authorization - Number of Visits 20    Progress Note Due on Visit 10    OT Start Time 1235    OT Stop Time 1313    OT Time Calculation (min) 38 min    Activity Tolerance Patient tolerated treatment well    Behavior During Therapy WFL for tasks assessed/performed             Past Medical History:  Diagnosis Date   Anemia, unspecified    Hyperlipidemia, unspecified    Hypertension    Hypothyroidism, unspecified    Pre-diabetes    Vitamin B12 deficiency     Past Surgical History:  Procedure Laterality Date   ABDOMINAL HYSTERECTOMY     GIVENS CAPSULE STUDY N/A 04/16/2018   Procedure: GIVENS CAPSULE STUDY;  Surgeon: Jeani Hawking, MD;  Location[facility] 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;  Surgeon: Carmela Rima, MD;  Location: [facility] OR;  Service: Ophthalmology;  Laterality: Right;   PARS PLANA VITRECTOMY Right 09/29/2020   Procedure: PARS PLANA VITRECTOMY WITH 25 GAUGE - ENDOCAUTRY;  Surgeon: Carmela Rima, MD;  Location: [facility] OR;  Service: Ophthalmology;  Laterality: Right;   PHOTOCOAGULATION WITH LASER Right 08/16/2020   Procedure: PHOTOCOAGULATION WITH LASER;  Surgeon: Carmela Rima, MD;  Location: [facility] OR;  Service: Ophthalmology;  Laterality: Right;   PHOTOCOAGULATION WITH LASER Right 09/29/2020   Procedure:  PHOTOCOAGULATION WITH LASER;  Surgeon: Carmela Rima, MD;  Location: [facility] OR;  Service: Ophthalmology;  Laterality: Right;   SHOULDER SURGERY     TONSILLECTOMY     TUBAL LIGATION      There were no vitals filed for this visit.   Subjective Assessment - 06/11/21 1402     Subjective  I'm really hurting today    Pertinent History Pt is a 70 y.o. female with recent Parkinson's disease diagnosis.  Pt with PMH that includes:anemia, hyperlipidemia, orthostatic hypotension, HTN, hypothyroidism, Vitamin B12 deficiency, hx of R RTC surgery, leg swelling, arthritis (per pt), obesity    Patient Stated Goals get better    Currently in Pain? Yes    Pain Score 6     Pain Location Back   shoulder   Pain Orientation Left    Pain Descriptors / Indicators Aching    Pain Type Chronic pain    Pain Onset More than a month ago    Pain Frequency Intermittent    Aggravating Factors  not taking meds    Pain Relieving Factors pain meds                  Treatment: Pt reports that she lost her meds yestersay and she is in pain today. Pt reports getting a medication refill. Hotpack to left shoulder  x 8 mins then to low back for approx 8 mins for pain relief. No adverse  reactions.While pt performed tableslides for gentle stretch, followed by coordination activities, flipping and dealing cards. Increased time required, pt is moving slower today due to pain.                OT Education - 06/11/21 1401     Education Details PWR! up, rock and twist in supine, min v.c, Flipping and dealing cards with big movement, min v.c/ mod difficulty dealing cards today    Person(s) Educated Patient    Methods Explanation;Demonstration;Verbal cues    Comprehension Verbalized understanding;Returned demonstration;Verbal cues required              OT Short Term Goals - 05/18/21 1349       OT SHORT TERM GOAL #1   Title Pt will be independent with PD-specific HEP--check STGs 05/14/21    Time 4     Period Weeks    Status Achieved   PWR! moves supine and coordination activities   Target Date 05/14/21      OT SHORT TERM GOAL #2   Title Pt will verbalize understanding of adapted strategies to maximize safety and I with ADLs/ IADLs .    Time 4    Period Weeks    Status On-going      OT SHORT TERM GOAL #3   Title Pt will improve R hand coordination for ADLs as shown by improving time on 9-hole peg test by at least 3 secs    Baseline 44.34    Time 4    Period Weeks    Status On-going   41.54     OT SHORT TERM GOAL #4   Title Pt will demonstrate improved ease with dressing as evidenced by  performing  PPT#4 donning/ doffing 18 secs or less    Baseline 25 secs    Time 4    Period Weeks    Status On-going   05/18/21:  38.87sec              OT Long Term Goals - 05/14/21 1304       OT LONG TERM GOAL #1   Title Pt will verbalize understanding of ways to prevent future PD related complications and PD community resources.    Time 12    Period Weeks    Status New      OT LONG TERM GOAL #2   Title Pt will verbalize  understanding of ways to keep thinking skills sharp.    Time 12    Period Weeks    Status New      OT LONG TERM GOAL #3   Title Pt will retrieve a lightweight object from overhead shelf with RUE with -10 elbow extension.    Time 12    Period Weeks    Status New      OT LONG TERM GOAL #4   Title Pt will perform LB dressing consistently mod I.    Time 12    Period Weeks    Status New                   Plan - 06/11/21 1403     Clinical Impression Statement Pt progress today was limited by pain. Pt repoerts missing doses of meds yesterday and having lots of pain today.    OT Occupational Profile and History Problem Focused Assessment - Including review of records relating to presenting problem    Occupational performance deficits (Please refer to evaluation for details): ADL's;IADL's;Leisure;Social Participation    Body Structure /  Function /  Physical Skills ADL;UE functional use;Balance;Flexibility;FMC;Gait;ROM;Coordination;GMC;Decreased knowledge of precautions;Decreased knowledge of use of DME;IADL;Dexterity;Strength;Mobility    Rehab Potential Good    OT Frequency 2x / week    OT Duration 12 weeks    OT Treatment/Interventions Self-care/ADL training;Energy conservation;Patient/family education;DME and/or AE instruction;Aquatic Therapy;Balance training;Fluidtherapy;Building services engineer;Therapeutic exercise;Moist Heat;Manual Therapy;Therapeutic activities;Neuromuscular education    Plan continue with large amplitude movements/strategies for ADLs, standing with functional reach, coordination, stretching to assist with LB dressing    Consulted and Agree with Plan of Care Patient             Patient will benefit from skilled therapeutic intervention in order to improve the following deficits and impairments:   Body Structure / Function / Physical Skills: ADL, UE functional use, Balance, Flexibility, FMC, Gait, ROM, Coordination, GMC, Decreased knowledge of precautions, Decreased knowledge of use of DME, IADL, Dexterity, Strength, Mobility       Visit Diagnosis: Other symptoms and signs involving the nervous system  Abnormal posture  Other symptoms and signs involving the musculoskeletal system  Other lack of coordination  Stiffness of right elbow, not elsewhere classified  Other abnormalities of gait and mobility  Unsteadiness on feet  Muscle weakness (generalized)  Stiffness of right shoulder, not elsewhere classified    Problem List Patient Active Problem List   Diagnosis Date Noted   Pain due to onychomycosis of toenails of both feet 06/17/2020   Parkinson's disease (HCC) 04/23/2020   Leg swelling 11/26/2019   Chronic venous insufficiency 11/26/2019   Vitamin B12 deficiency 02/06/2018   Sepsis secondary to UTI (HCC) 01/29/2018   Microcytic anemia 01/29/2018   Renal insufficiency 01/29/2018    Pure hypercholesterolemia 12/24/2014   Diabetes mellitus type 2, controlled, without complications (HCC) 12/24/2014   Bruit of right carotid artery 12/24/2014   Thyroid disease 07/06/2014   Severe obesity (BMI >= 40) (HCC) 07/06/2014   Essential hypertension 07/06/2014    Blayton Huttner, OT/L 06/11/2021, 2:05 PM  [facility] [address] [place], [place], [address] Phone: [phone]   Fax:  [phone]  Name: Anna Bernard MRN: [id number] Date of Birth: [birth year]

## 2021-06-14 ENCOUNTER — Ambulatory Visit: Payer: Medicare Other | Admitting: Neurology

## 2021-06-14 ENCOUNTER — Other Ambulatory Visit: Payer: Self-pay

## 2021-06-14 ENCOUNTER — Encounter: Payer: Self-pay | Admitting: Neurology

## 2021-06-14 VITALS — BP 126/79 | HR 80 | Ht 61.0 in | Wt 172.2 lb

## 2021-06-14 DIAGNOSIS — G20 Parkinson's disease: Secondary | ICD-10-CM

## 2021-06-14 NOTE — Patient Instructions (Signed)
You have been scheduled for a levodopa challenge (also called an on/off test) on 11/4.  Please plan on being here in the office for about 2 hours.  Please stop your medications for your Parkinsons Disease (and only the ones that I prescribe to you for your Parkinsons disease) 24 hours prior to the appointment.  Be prepared to arrive at the appointment early as you likely will be slow and stiff that day without your medication and it may take more time to get in the office.  We will examine you in the office and then give you some medication.  We will wait 30-45 minutes for that medication to work, so feel free to bring something to do during that time (read a book, listen to airpods, etc).  You will then be examined again and we will talk about the results of the test.

## 2021-06-16 ENCOUNTER — Ambulatory Visit: Payer: Medicare Other | Admitting: Occupational Therapy

## 2021-06-16 ENCOUNTER — Ambulatory Visit: Payer: Medicare Other | Admitting: Physical Therapy

## 2021-06-16 ENCOUNTER — Encounter: Payer: Self-pay | Admitting: Physical Therapy

## 2021-06-16 ENCOUNTER — Other Ambulatory Visit: Payer: Self-pay

## 2021-06-16 DIAGNOSIS — R29818 Other symptoms and signs involving the nervous system: Secondary | ICD-10-CM | POA: Diagnosis not present

## 2021-06-16 DIAGNOSIS — R2689 Other abnormalities of gait and mobility: Secondary | ICD-10-CM

## 2021-06-16 DIAGNOSIS — M25621 Stiffness of right elbow, not elsewhere classified: Secondary | ICD-10-CM

## 2021-06-16 DIAGNOSIS — R29898 Other symptoms and signs involving the musculoskeletal system: Secondary | ICD-10-CM

## 2021-06-16 DIAGNOSIS — M6281 Muscle weakness (generalized): Secondary | ICD-10-CM

## 2021-06-16 DIAGNOSIS — R293 Abnormal posture: Secondary | ICD-10-CM

## 2021-06-16 DIAGNOSIS — R2681 Unsteadiness on feet: Secondary | ICD-10-CM

## 2021-06-16 DIAGNOSIS — R278 Other lack of coordination: Secondary | ICD-10-CM

## 2021-06-16 DIAGNOSIS — M25611 Stiffness of right shoulder, not elsewhere classified: Secondary | ICD-10-CM

## 2021-06-16 NOTE — Addendum Note (Signed)
Addended by: Drake Leach on: 06/16/2021 04:57 PM   Modules accepted: Orders

## 2021-06-16 NOTE — Patient Instructions (Addendum)
Seated in a chair, reach down to each ankle and touch with both hands 10 reps   Take a belt under your foot, use the belt to bring your right leg up across your left leg and hold it for 5 secs, repeat with your other leg for a total of 10 reps.  Seated in chair, march in place 10 reps

## 2021-06-16 NOTE — Therapy (Addendum)
[facility] [address] [place], [place], [address] Phone: [phone]   Fax:  [phone]  Physical Therapy Treatment/Re-Cert  Patient Details  Name: Anna Bernard MRN: [id number] Date of Birth: 01-07-1951 Referring Provider (PT): Alonza Bogus, DO   Encounter Date: 06/16/2021   PT End of Session - 06/16/21 1444     Visit Number 17    Number of Visits 21    Date for PT Re-Evaluation 03/19/15   per re-cert on 0/10/93   Authorization Type [employer] Medicare    Progress Note Due on Visit 20    PT Start Time 1403    PT Stop Time 1443    PT Time Calculation (min) 40 min    Equipment Utilized During Treatment Gait belt    Activity Tolerance Patient tolerated treatment well    Behavior During Therapy WFL for tasks assessed/performed;Flat affect             Past Medical History:  Diagnosis Date   Anemia, unspecified    Hyperlipidemia, unspecified    Hypertension    Hypothyroidism, unspecified    Pre-diabetes    Vitamin B12 deficiency     Past Surgical History:  Procedure Laterality Date   ABDOMINAL HYSTERECTOMY     GIVENS CAPSULE STUDY N/A 04/16/2018   Procedure: GIVENS CAPSULE STUDY;  Surgeon: Carol Ada, MD;  Location: [facility];  Service: Endoscopy;  Laterality: N/A;   PARS PLANA VITRECTOMY Right 08/16/2020   Procedure: 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;  Service: Ophthalmology;  Laterality: Right;   PHOTOCOAGULATION WITH LASER Right 08/16/2020   Procedure: PHOTOCOAGULATION WITH LASER;  Surgeon: Jalene Mullet, MD;  Location: [facility];  Service: Ophthalmology;  Laterality: Right;   PHOTOCOAGULATION WITH LASER Right 09/29/2020    Procedure: PHOTOCOAGULATION WITH LASER;  Surgeon: Jalene Mullet, MD;  Location: [facility];  Service: Ophthalmology;  Laterality: Right;   SHOULDER SURGERY     TONSILLECTOMY     TUBAL LIGATION      There were no vitals filed for this visit.   Subjective Assessment - 06/16/21 1406     Subjective Feeling much better, no falls.    Pertinent History Dx with Parkinson's disease (Dr. Carles Collet) 04/2020; PMH of HTN, DM, hypothyroidism, orthostatic hypotension    Limitations Walking    Patient Stated Goals wants to get back on track with an exercise program.    Currently in Pain? Yes    Pain Score 3     Pain Location Hip    Pain Orientation Left    Pain Descriptors / Indicators Aching    Pain Type Chronic pain    Pain Onset More than a month ago    Aggravating Factors  not taking meds    Pain Relieving Factors pain meds                [facility] PT Assessment - 06/16/21 1412       Assessment   Medical Diagnosis Parkinson's Disease    Referring Provider (PT) Alonza Bogus, DO    Onset Date/Surgical Date 03/12/21                           [facility]  Adult PT Treatment/Exercise - 06/16/21 1412       Transfers   Transfers Sit to Stand;Stand to Sit    Sit to Stand 5: Supervision    Five time sit to stand comments  20.94 seconds with BUE support from chair    Stand to Sit 5: Supervision    Comments x7 reps with rollator in front of pt, working on sit <> stands with rocking for incr momentum and pushing off front of chair (as pt reports that the chair she uses at home to perform her seated peddle bike is hard to stand up from), pt with improved ease of transfer, discussed this as a technique for pt to perform when getting out of that chair      Ambulation/Gait   Ambulation/Gait Yes    Ambulation/Gait Assistance 5: Supervision    Ambulation/Gait Assistance Details 2MWT: 230', Cues for pursed lip breathing afterwards    Ambulation Distance (Feet) 230 Feet    Assistive device Rollator     Gait Pattern Step-through pattern;Decreased step length - right;Decreased step length - left;Decreased stance time - right;Decreased stance time - left;Decreased dorsiflexion - right;Decreased dorsiflexion - left;Shuffle;Poor foot clearance - left;Poor foot clearance - right    Ambulation Surface Level;Indoor      Self-Care   Self-Care Other Self-Care Comments    Other Self-Care Comments  Discussed POC going forward - pt with last 2 visits next week and plan for D/C after with plan for return PT/OT evals in 6 months. Discussed importance of continued exercise as pt has the resources/tools provided from therapy, Pt has been trying the foot pedaler, but its hard gettingout of the chair she is in. Has not yet tried using her exercise chart at home, but understands the importance of using it. Has not been walking at home for her walking program, reviewed with pt how to incorporate walking into her day (performing her 2 minutes of walking prior to lunch time or dinner before walking to kitchen) or setting a certain time each day for walking/exercises. Pt's husband present at start of session to review this information      Exercises   Exercises Other Exercises    Other Exercises  with BUE support on chair: x10 reps heel raises, x10 reps bilat alternating hip ABD                     PT Education - 06/16/21 1444     Education Details See self care section. Progress towards goals.    Person(s) Educated Patient;Spouse    Methods Explanation    Comprehension Verbalized understanding              PT Short Term Goals - 05/20/21 1331       PT SHORT TERM GOAL #1   Title ALL STGS = LTGS               PT Long Term Goals - 06/16/21 1425       PT LONG TERM GOAL #1   Title Pt will perform progression of HEP and walking program, including verbalizing plans for optimal fitness plan upon d/c from PT.  ALL LTGS DUE 06/17/21    Baseline will benefit from review    Time 4    Period  Weeks    Status On-going      PT LONG TERM GOAL #2   Title Pt will improve 5x sit<>stand in less than or equal to 18 seconds with BUE support  for improved transfer efficiency and functional strength.    Baseline 21.50 seconds; 20.31 seconds with BUE support from chair on 05/12/21; 20.9 seconds on 06/16/21    Time 4    Period Weeks    Status Not Met      PT LONG TERM GOAL #3   Title Pt will improve gait velocity to at least 2.25 ft/sec for improved gait efficiency in the community and safety.    Baseline 1.86 ft/sec with rollator; 15.43 seconds = 2.12 ft/sec on 05/12/21    Time 4    Period Weeks    Status Revised      PT LONG TERM GOAL #4   Title Pt will improve 2MWT distance with rollator to at least 225' feet for improved gait efficiency and endurance.    Baseline 210' with rollator;  230' with rollator on 06/16/21    Time 4    Period Weeks    Status Achieved      PT LONG TERM GOAL #5   Title Pt and pt's husband will verbalize understanding of local PD resources.    Baseline info provided by OT today, 05/14/21, will benefit from review before D/C    Time 4    Period Weeks    Status On-going            Ongoing LTGs:   PT Long Term Goals - 06/16/21 1655       PT LONG TERM GOAL #1   Title Pt will perform progression of HEP and walking program, including verbalizing plans for optimal fitness plan upon d/c from PT.  ALL LTGS DUE 07/14/21    Baseline will benefit from review    Time 4    Period Weeks    Status On-going    Target Date 07/14/21      PT LONG TERM GOAL #3   Title Pt will improve gait velocity to at least 2.25 ft/sec for improved gait efficiency in the community and safety.    Baseline 1.86 ft/sec with rollator; 15.43 seconds = 2.12 ft/sec on 05/12/21    Time 4    Period Weeks    Status On-going      PT LONG TERM GOAL #5   Title Pt and pt's husband will verbalize understanding of local PD resources.    Baseline info provided by OT today, 05/14/21, will  benefit from review before D/C    Time 4    Period Weeks    Status On-going                   Plan - 06/16/21 1651     Clinical Impression Statement Began to check LTGs today for anticipated D/C at end of next week of visits. Pt met LTG #4 and has improved 2MWT distance to 230' with use of rollator (previously 210'). Pt did not meet LTG #2, performed 5x sit <> stand in 20.9 seconds today with BUE support (previously 20.3 seconds). Continued to educate pt and pt's husband on importance of performing exercises/walking after D/C from therapy. Pt verbalizes understanding of importance, but it is just a matter about doing it/being motivated. Will re-cert to cover last 2 visits next week to check remainder of LTGs and finalize pt's HEP/exercise program. Will plan to schedule pt for return PT evals.    Personal Factors and Comorbidities Comorbidity 3+;Age    Comorbidities PMH:  HTN, DM, hypothyroidism, orthostatic hypotension    Examination-Activity Limitations Locomotion Level;Stand;Transfers;Stairs    Examination-Participation Restrictions Community  Activity;Other   Plans for travel in retirement   Stability/Clinical Decision Making Evolving/Moderate complexity    Rehab Potential Good    PT Frequency 2x / week   anticipate only 2 visits for next weke   PT Duration 4 weeks    PT Treatment/Interventions ADLs/Self Care Home Management;DME Instruction;Neuromuscular re-education;Balance training;Therapeutic exercise;Therapeutic activities;Functional mobility training;Gait training;Patient/family education;Manual techniques;Stair training;Orthotic Fit/Training    PT Next Visit Plan Has she tried her foot peddler? Check remainder of LTGs this week/finalize pt's HEP. schedule for return PT/OT evals and screens in 6 months    Consulted and Agree with Plan of Care Patient             Patient will benefit from skilled therapeutic intervention in order to improve the following deficits and  impairments:  Abnormal gait, Difficulty walking, Decreased balance, Decreased mobility, Decreased strength, Postural dysfunction, Decreased activity tolerance, Decreased endurance, Decreased coordination  Visit Diagnosis: Other symptoms and signs involving the nervous system  Abnormal posture  Other symptoms and signs involving the musculoskeletal system  Other abnormalities of gait and mobility     Problem List Patient Active Problem List   Diagnosis Date Noted   Pain due to onychomycosis of toenails of both feet 06/17/2020   Parkinson's disease ([place]) 04/23/2020   Leg swelling 11/26/2019   Chronic venous insufficiency 11/26/2019   Vitamin B12 deficiency 02/06/2018   Sepsis secondary to UTI ([place]) 01/29/2018   Microcytic anemia 01/29/2018   Renal insufficiency 01/29/2018   Pure hypercholesterolemia 12/24/2014   Diabetes mellitus type 2, controlled, without complications ([place]) 58/05/9832   Bruit of right carotid artery 12/24/2014   Thyroid disease 07/06/2014   Severe obesity (BMI >= 40) ([place]) 07/06/2014   Essential hypertension 07/06/2014    Arliss Journey, PT, DPT  06/16/2021, 4:54 PM  [facility] [address] [place], [place], [address] Phone: [phone]   Fax:  [phone]  Name: Anna Bernard MRN: [id number] Date of Birth: [birth year]

## 2021-06-17 NOTE — Therapy (Signed)
[facility] [address] [place], [place], [address] Phone: [phone]   Fax:  [phone]  Occupational Therapy Treatment  Patient Details  Name: Anna Bernard MRN: [id number] Date of Birth: 1951/05/05 Referring Provider (OT): Dr. Arbutus Leas   Encounter Date: 06/16/2021   OT End of Session - 06/16/21 1542     Visit Number 15    Number of Visits 25    Date for OT Re-Evaluation 07/09/21    Authorization Type UHC Medicare    Authorization - Visit Number 15    Authorization - Number of Visits 20    OT Start Time 1455   pt in BR   OT Stop Time 1530    OT Time Calculation (min) 35 min             Past Medical History:  Diagnosis Date   Anemia, unspecified    Hyperlipidemia, unspecified    Hypertension    Hypothyroidism, unspecified    Pre-diabetes    Vitamin B12 deficiency     Past Surgical History:  Procedure Laterality Date   ABDOMINAL HYSTERECTOMY     GIVENS CAPSULE STUDY N/A 04/16/2018   Procedure: GIVENS CAPSULE STUDY;  Surgeon: Jeani Hawking, MD;  Location: 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;  Surgeon: Carmela Rima, MD;  Location: [facility] OR;  Service: Ophthalmology;  Laterality: Right;   PARS PLANA VITRECTOMY Right 09/29/2020   Procedure: PARS PLANA VITRECTOMY WITH 25 GAUGE - ENDOCAUTRY;  Surgeon: Carmela Rima, MD;  Location: [facility] OR;  Service: Ophthalmology;  Laterality: Right;   PHOTOCOAGULATION WITH LASER Right 08/16/2020   Procedure: PHOTOCOAGULATION WITH LASER;  Surgeon: Carmela Rima, MD;  [facility] OR;  Service: Ophthalmology;  Laterality: Right;   PHOTOCOAGULATION WITH LASER Right 09/29/2020   Procedure: PHOTOCOAGULATION WITH LASER;  Surgeon: Carmela Rima, MD;  Location: [facility] OR;  Service: Ophthalmology;  Laterality: Right;   SHOULDER SURGERY      TONSILLECTOMY     TUBAL LIGATION      There were no vitals filed for this visit.   Subjective Assessment - 06/16/21 1502     Subjective  Left hip pain    Pertinent History Pt is a 70 y.o. female with recent Parkinson's disease diagnosis.  Pt with PMH that includes:anemia, hyperlipidemia, orthostatic hypotension, HTN, hypothyroidism, Vitamin B12 deficiency, hx of R RTC surgery, leg swelling, arthritis (per pt), obesity    Patient Stated Goals get better    Currently in Pain? Yes    Pain Score 3     Pain Location Hip    Pain Orientation Left    Pain Descriptors / Indicators Aching    Pain Onset More than a month ago    Pain Frequency Intermittent    Aggravating Factors  not taking meds    Pain Relieving Factors pain meds                              OT Education - 06/17/21 0843     Education Details Stretches in prep for LB dressing,- see handout,  bag exercise to simulate pulling up socks and donning shirt, min v.c and demonstration. Flipping and dealing cards with big movments, min v.c    Person(s) Educated Patient    Methods Explanation;Demonstration;Verbal cues;Handout    Comprehension Verbalized understanding;Returned demonstration;Verbal cues required  OT Short Term Goals - 05/18/21 1349       OT SHORT TERM GOAL #1   Title Pt will be independent with PD-specific HEP--check STGs 05/14/21    Time 4    Period Weeks    Status Achieved   PWR! moves supine and coordination activities   Target Date 05/14/21      OT SHORT TERM GOAL #2   Title Pt will verbalize understanding of adapted strategies to maximize safety and I with ADLs/ IADLs .    Time 4    Period Weeks    Status On-going      OT SHORT TERM GOAL #3   Title Pt will improve R hand coordination for ADLs as shown by improving time on 9-hole peg test by at least 3 secs    Baseline 44.34    Time 4    Period Weeks    Status On-going   41.54     OT SHORT TERM GOAL #4   Title Pt  will demonstrate improved ease with dressing as evidenced by  performing  PPT#4 donning/ doffing 18 secs or less    Baseline 25 secs    Time 4    Period Weeks    Status On-going   05/18/21:  38.87sec              OT Long Term Goals - 05/14/21 1304       OT LONG TERM GOAL #1   Title Pt will verbalize understanding of ways to prevent future PD related complications and PD community resources.    Time 12    Period Weeks    Status New      OT LONG TERM GOAL #2   Title Pt will verbalize  understanding of ways to keep thinking skills sharp.    Time 12    Period Weeks    Status New      OT LONG TERM GOAL #3   Title Pt will retrieve a lightweight object from overhead shelf with RUE with -10 elbow extension.    Time 12    Period Weeks    Status New      OT LONG TERM GOAL #4   Title Pt will perform LB dressing consistently mod I.    Time 12    Period Weeks    Status New                   Plan - 06/17/21 0844     Clinical Impression Statement Pt is progressing towards goals. She reports feeling better at end of session following stretches.    OT Occupational Profile and History Problem Focused Assessment - Including review of records relating to presenting problem    Occupational performance deficits (Please refer to evaluation for details): ADL's;IADL's;Leisure;Social Participation    Body Structure / Function / Physical Skills ADL;UE functional use;Balance;Flexibility;FMC;Gait;ROM;Coordination;GMC;Decreased knowledge of precautions;Decreased knowledge of use of DME;IADL;Dexterity;Strength;Mobility    Rehab Potential Good    OT Frequency 2x / week    OT Duration 12 weeks    OT Treatment/Interventions Self-care/ADL training;Energy conservation;Patient/family education;DME and/or AE instruction;Aquatic Therapy;Balance training;Fluidtherapy;Building services engineer;Therapeutic exercise;Moist Heat;Manual Therapy;Therapeutic activities;Neuromuscular education    Plan  review LB stretches, start checking goals anticipate d/c next week    Consulted and Agree with Plan of Care Patient             Patient will benefit from skilled therapeutic intervention in order to improve the following deficits and impairments:  Body Structure / Function / Physical Skills: ADL, UE functional use, Balance, Flexibility, FMC, Gait, ROM, Coordination, GMC, Decreased knowledge of precautions, Decreased knowledge of use of DME, IADL, Dexterity, Strength, Mobility       Visit Diagnosis: Other symptoms and signs involving the nervous system  Abnormal posture  Other symptoms and signs involving the musculoskeletal system  Other abnormalities of gait and mobility  Other lack of coordination  Stiffness of right elbow, not elsewhere classified  Unsteadiness on feet  Muscle weakness (generalized)  Stiffness of right shoulder, not elsewhere classified    Problem List Patient Active Problem List   Diagnosis Date Noted   Pain due to onychomycosis of toenails of both feet 06/17/2020   Parkinson's disease ([facility]) 04/23/2020   Leg swelling 11/26/2019   Chronic venous insufficiency 11/26/2019   Vitamin B12 deficiency 02/06/2018   Sepsis secondary to UTI ([facility]) 01/29/2018   Microcytic anemia 01/29/2018   Renal insufficiency 01/29/2018   Pure hypercholesterolemia 12/24/2014   Diabetes mellitus type 2, controlled, without complications ([facility]) 12/24/2014   Bruit of right carotid artery 12/24/2014   Thyroid disease 07/06/2014   Severe obesity (BMI >= 40) ([facility]) 07/06/2014   Essential hypertension 07/06/2014    Devun Anna, OT/L 06/17/2021, 9:58 AM  [facility] [address] [place], [place], [address] Phone: [phone]   Fax:  [phone]  Name: Anna Bernard MRN: [id number] Date of Birth: 03-Feb-1951

## 2021-06-21 ENCOUNTER — Ambulatory Visit: Payer: Medicare Other | Attending: Neurology | Admitting: Occupational Therapy

## 2021-06-21 ENCOUNTER — Encounter: Payer: Self-pay | Admitting: Occupational Therapy

## 2021-06-21 ENCOUNTER — Ambulatory Visit: Payer: Medicare Other | Admitting: Physical Therapy

## 2021-06-21 ENCOUNTER — Other Ambulatory Visit: Payer: Self-pay

## 2021-06-21 VITALS — HR 84

## 2021-06-21 DIAGNOSIS — M25611 Stiffness of right shoulder, not elsewhere classified: Secondary | ICD-10-CM | POA: Diagnosis present

## 2021-06-21 DIAGNOSIS — R278 Other lack of coordination: Secondary | ICD-10-CM | POA: Diagnosis present

## 2021-06-21 DIAGNOSIS — R2681 Unsteadiness on feet: Secondary | ICD-10-CM

## 2021-06-21 DIAGNOSIS — R2689 Other abnormalities of gait and mobility: Secondary | ICD-10-CM

## 2021-06-21 DIAGNOSIS — M25621 Stiffness of right elbow, not elsewhere classified: Secondary | ICD-10-CM | POA: Diagnosis present

## 2021-06-21 DIAGNOSIS — R293 Abnormal posture: Secondary | ICD-10-CM | POA: Diagnosis present

## 2021-06-21 DIAGNOSIS — M6281 Muscle weakness (generalized): Secondary | ICD-10-CM | POA: Insufficient documentation

## 2021-06-21 DIAGNOSIS — R29818 Other symptoms and signs involving the nervous system: Secondary | ICD-10-CM | POA: Insufficient documentation

## 2021-06-21 DIAGNOSIS — R29898 Other symptoms and signs involving the musculoskeletal system: Secondary | ICD-10-CM | POA: Insufficient documentation

## 2021-06-21 NOTE — Therapy (Signed)
[facility] [address] [place], [place], [address] Phone: [phone]   Fax:  [phone]  Occupational Therapy Treatment  Patient Details  Name: Anna Bernard MRN: [id number] Date of Birth: December 25, 1950 Referring Provider (OT): Dr. Carles Collet   Encounter Date: 06/21/2021   OT End of Session - 06/21/21 1322     Visit Number 16    Number of Visits 25    Date for OT Re-Evaluation 07/09/21    Authorization Type [employer] Medicare    Authorization - Visit Number 16    Authorization - Number of Visits 20    OT Start Time 6226    OT Stop Time 1400    OT Time Calculation (min) 38 min    Activity Tolerance Patient tolerated treatment well    Behavior During Therapy WFL for tasks assessed/performed;Flat affect             Past Medical History:  Diagnosis Date   Anemia, unspecified    Hyperlipidemia, unspecified    Hypertension    Hypothyroidism, unspecified    Pre-diabetes    Vitamin B12 deficiency     Past Surgical History:  Procedure Laterality Date   ABDOMINAL HYSTERECTOMY     GIVENS CAPSULE STUDY N/A 04/16/2018   Procedure: GIVENS CAPSULE STUDY;  Surgeon: Carol Ada, MD;  Location: [facility];  Service: Endoscopy;  Laterality: N/A;   PARS PLANA VITRECTOMY Right 08/16/2020   Procedure: 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;  Surgeon: Jalene Mullet, MD;  Location: [facility];  Service: Ophthalmology;  Laterality: Right;   PARS PLANA VITRECTOMY Right 09/29/2020   Procedure: PARS PLANA VITRECTOMY WITH 25 GAUGE - ENDOCAUTRY;  Surgeon: Jalene Mullet, MD;  Location: [facility];  Service: Ophthalmology;  Laterality: Right;   PHOTOCOAGULATION WITH LASER Right 08/16/2020   Procedure: PHOTOCOAGULATION WITH LASER;  Surgeon: Jalene Mullet, MD;  Location: [facility];  Service: Ophthalmology;  Laterality: Right;   PHOTOCOAGULATION WITH LASER Right 09/29/2020   Procedure: PHOTOCOAGULATION WITH LASER;   Surgeon: Jalene Mullet, MD;  Location: [facility];  Service: Ophthalmology;  Laterality: Right;   SHOULDER SURGERY     TONSILLECTOMY     TUBAL LIGATION      There were no vitals filed for this visit.   Subjective Assessment - 06/21/21 1322     Subjective  I got rubbed down good before I came today    Pertinent History Pt is a 70 y.o. female with recent Parkinson's disease diagnosis.  Pt with PMH that includes:anemia, hyperlipidemia, orthostatic hypotension, HTN, hypothyroidism, Vitamin B12 deficiency, hx of R RTC surgery, leg swelling, arthritis (per pt), obesity    Patient Stated Goals get better    Currently in Pain? No/denies    Pain Onset More than a month ago              Placing grooved pegs in pegboard with R hand with min-mod difficulty.  PWR! Up in sitting with PWR! Hands with min-mod cueing for incr movement amplitude.  Sitting, cane ex for shoulder flex with min cueing.    Pulling bag in palm using large amplitude movements (initiated in air, then completed on leg prn) with min cueing.  PWR! Hands (twist and rock) on table with min cueing for incr movement amplitude.  Began checking remaining STGs and LTGs--see goals section below.        OT Education - 06/21/21 1326     Education Details Reviewed stretches issued last session (LE stretch  with belt, marching, reach to touch ankle); Ways to Keep Thinking Skills Sharp--handout provided    Person(s) Educated Patient    Methods Explanation;Demonstration;Verbal cues    Comprehension Verbalized understanding;Returned demonstration              OT Short Term Goals - 06/21/21 1339       OT SHORT TERM GOAL #1   Title Pt will be independent with PD-specific HEP--check STGs 05/14/21    Time 4    Period Weeks    Status Achieved   PWR! moves supine and coordination activities   Target Date 05/14/21      OT SHORT TERM GOAL #2   Title Pt will verbalize understanding of adapted strategies to maximize safety and  I with ADLs/ IADLs .    Time 4    Period Weeks    Status On-going      OT SHORT TERM GOAL #3   Title Pt will improve R hand coordination for ADLs as shown by improving time on 9-hole peg test by at least 3 secs    Baseline 44.34    Time 4    Period Weeks    Status Not Met   41.54.  06/21/21:  50.35sec     OT SHORT TERM GOAL #4   Title Pt will demonstrate improved ease with dressing as evidenced by  performing  PPT#4 donning/ doffing 18 secs or less    Baseline 25 secs    Time 4    Period Weeks    Status On-going   05/18/21:  38.87sec              OT Long Term Goals - 06/21/21 1335       OT LONG TERM GOAL #1   Title Pt will verbalize understanding of ways to prevent future PD related complications and PD community resources.    Time 12    Period Weeks    Status New      OT LONG TERM GOAL #2   Title Pt will verbalize  understanding of ways to keep thinking skills sharp.    Time 12    Period Weeks    Status Achieved   06/21/21:  met     OT LONG TERM GOAL #3   Title Pt will retrieve a lightweight object from overhead shelf with RUE with -10 elbow extension.    Time 12    Period Weeks    Status Not Met   06/21/21:  -20*     OT LONG TERM GOAL #4   Title Pt will perform LB dressing consistently mod I.    Time 12    Period Weeks    Status Partially Met   06/21/21:  pt able to perform with incr time, but husband helps if in a hurry/time constraints     OT LONG TERM GOAL #5   Title --                   Plan - 06/21/21 1323     Clinical Impression Statement Pt has made slow progress towards goals. Pt continues to be limited by rigidity, but improved after stretching. Pt reports performing LB dressing now, but when rushing.    OT Occupational Profile and History Problem Focused Assessment - Including review of records relating to presenting problem    Occupational performance deficits (Please refer to evaluation for details): ADL's;IADL's;Leisure;Social  Participation    Body Structure / Function / Physical Skills ADL;UE functional use;Balance;Flexibility;FMC;Gait;ROM;Coordination;GMC;Decreased  knowledge of precautions;Decreased knowledge of use of DME;IADL;Dexterity;Strength;Mobility    Rehab Potential Good    OT Frequency 2x / week    OT Duration 12 weeks    OT Treatment/Interventions Self-care/ADL training;Energy conservation;Patient/family education;DME and/or AE instruction;Aquatic Therapy;Balance training;Fluidtherapy;Therapist, nutritional;Therapeutic exercise;Moist Heat;Manual Therapy;Therapeutic activities;Neuromuscular education    Plan educate in ways to prevent future PD related complications and PD community resources, review ADL strategies, check remaining goals, anticipate d/c, schedule follow-up evaluations    Consulted and Agree with Plan of Care Patient             Patient will benefit from skilled therapeutic intervention in order to improve the following deficits and impairments:   Body Structure / Function / Physical Skills: ADL, UE functional use, Balance, Flexibility, FMC, Gait, ROM, Coordination, GMC, Decreased knowledge of precautions, Decreased knowledge of use of DME, IADL, Dexterity, Strength, Mobility       Visit Diagnosis: Other symptoms and signs involving the nervous system  Abnormal posture  Other symptoms and signs involving the musculoskeletal system  Other abnormalities of gait and mobility  Other lack of coordination  Stiffness of right elbow, not elsewhere classified  Unsteadiness on feet  Stiffness of right shoulder, not elsewhere classified    Problem List Patient Active Problem List   Diagnosis Date Noted   Pain due to onychomycosis of toenails of both feet 06/17/2020   Parkinson's disease ([facility]) 04/23/2020   Leg swelling 11/26/2019   Chronic venous insufficiency 11/26/2019   Vitamin B12 deficiency 02/06/2018   Sepsis secondary to UTI ([facility]) 01/29/2018   Microcytic anemia  01/29/2018   Renal insufficiency 01/29/2018   Pure hypercholesterolemia 12/24/2014   Diabetes mellitus type 2, controlled, without complications ([facility]) 37/34/2876   Bruit of right carotid artery 12/24/2014   Thyroid disease 07/06/2014   Severe obesity (BMI >= 40) ([facility]) 07/06/2014   Essential hypertension 07/06/2014    Ratasha Fabre, OT/L 06/21/2021, 2:13 PM  [facility] [address] [place], [place], [address] Phone: [phone]   Fax:  [phone]  Name: Anna Bernard MRN: [id number] Date of Birth: 1951-05-27  Vianne Bulls, OTR/L [facility] [address] [place], [place]  [address] [phone] phone [phone] 06/21/21 2:13 PM

## 2021-06-21 NOTE — Patient Instructions (Signed)
Keeping Thinking Skills Sharp: 1. Jigsaw puzzles 2. Card/board games 3. Talking on the phone/social events 4. [email or web address] 5. Online games 6. Word serches/crossword puzzles 7.  Logic puzzles 8. Aerobic exercise (stationary bike) 9. Eating balanced diet (fruits & veggies) 10. Drink water 11. Try something new--new recipe, hobby 12. Crafts 13. Do a variety of activities that are challenging  

## 2021-06-21 NOTE — Patient Instructions (Addendum)
For Floor Bike: -Move floor bike into the dining room where there is an outlet.  Plug it in and set up bike beside dining room table.  -Have dining room chair pulled out, facing bike, one side of the chair is close to the side of the table.    -That way Anna Bernard will have room to sit down on the side of the chair, swing her legs around, use the bike, and then swing back around to the side of the chair and stand up with one hand on the dining room table.

## 2021-06-21 NOTE — Therapy (Signed)
[facility] [address] [place], [place], [address] Phone: [phone]   Fax:  [phone]  Physical Therapy Treatment  Patient Details  Name: Anna Bernard MRN: [id number] Date of Birth: [birth year] Referring Provider (PT): Alonza Bogus, DO   Encounter Date: 06/21/2021   PT End of Session - 06/21/21 1239     Visit Number 18    Number of Visits 21    Date for PT Re-Evaluation 99/37/16   per re-cert on 9/67/89   Authorization Type [employer] Medicare    Progress Note Due on Visit 20    PT Start Time 1237    PT Stop Time 1315    PT Time Calculation (min) 38 min    Equipment Utilized During Treatment --    Activity Tolerance Patient tolerated treatment well    Behavior During Therapy WFL for tasks assessed/performed;Flat affect             Past Medical History:  Diagnosis Date   Anemia, unspecified    Hyperlipidemia, unspecified    Hypertension    Hypothyroidism, unspecified    Pre-diabetes    Vitamin B12 deficiency     Past Surgical History:  Procedure Laterality Date   ABDOMINAL HYSTERECTOMY     GIVENS CAPSULE STUDY N/A 04/16/2018   Procedure: GIVENS CAPSULE STUDY;  Surgeon: Carol Ada, MD;  Location: [facility];  Service: Endoscopy;  Laterality: N/A;   PARS PLANA VITRECTOMY Right 08/16/2020   Procedure: 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;  Surgeon: Jalene Mullet, MD;  Location: [facility];  Service: Ophthalmology;  Laterality: Right;   PARS PLANA VITRECTOMY Right 09/29/2020   Procedure: PARS PLANA VITRECTOMY WITH 25 GAUGE - ENDOCAUTRY;  Surgeon: Jalene Mullet, MD;  Location: [facility];  Service: Ophthalmology;  Laterality: Right;   PHOTOCOAGULATION WITH LASER Right 08/16/2020   Procedure: PHOTOCOAGULATION WITH LASER;  Surgeon: Jalene Mullet, MD;  Location: [facility];  Service: Ophthalmology;  Laterality: Right;   PHOTOCOAGULATION WITH LASER Right 09/29/2020   Procedure:  PHOTOCOAGULATION WITH LASER;  Surgeon: Jalene Mullet, MD;  Location: [facility];  Service: Ophthalmology;  Laterality: Right;   SHOULDER SURGERY     TONSILLECTOMY     TUBAL LIGATION      Vitals:   06/21/21 1243  Pulse: 84  SpO2: 98%     Subjective Assessment - 06/21/21 1239     Subjective Still feeling sleepy, doesn't notice a difference with changes in medication.  Still having to rub her muscles with cream 2x/day due to stiffness.  Tried the rocking method for getting up from the chair and it was still scary, felt like she was going to fall.  Forgot to bring in the picture of the floor pedal bike.    Pertinent History Dx with Parkinson's disease (Dr. Carles Collet) 04/2020; PMH of HTN, DM, hypothyroidism, orthostatic hypotension    Limitations Walking    Patient Stated Goals wants to get back on track with an exercise program.    Currently in Pain? Yes    Pain Onset More than a month ago                [facility] PT Assessment - 06/21/21 1246       Ambulation/Gait   Gait velocity 15 seconds with rollator or 2.18 ft/sec      Standardized Balance Assessment   Standardized Balance Assessment 10 meter walk test  [facility] Adult PT Treatment/Exercise - 06/21/21 1246       Transfers   Transfers Sit to Stand;Stand to Sit    Sit to Stand 5: Supervision    Sit to Stand Details (indicate cue type and reason) from chair without arm rests, turned sideways in chair pushing up from table    Stand to Sit 5: Supervision      Therapeutic Activites    Therapeutic Activities Other Therapeutic Activities    Other Therapeutic Activities Continued to discuss ways for pt to safely set up floor bike and rise from chair without arm rests.  Discussed how pt has bike set up currently and alternative set up.  Suggested moving bike to dining room putting bike beside table, turn chair to face bike beside table to allow pt to continue to sit on the side of the chair and swivel around to use the bike  and then turn sideways and push up from table to stand.  Set up bike and chair at clinic table and simulated transfer.  Pt able to stand with supervision; verbal cues for turning once seated.  Pt believes this set up will work for home.  Provided pt with written set up instructions and drawing.  Pt to show to husband, attempt new set up and will report back to primary PT on Thursday.                     PT Education - 06/21/21 1334     Education Details see TA and instructions for new set up for floor bike    Person(s) Educated Patient    Methods Explanation;Demonstration;Handout    Comprehension Verbalized understanding;Returned demonstration              PT Short Term Goals - 05/20/21 1331       PT SHORT TERM GOAL #1   Title ALL STGS = LTGS               PT Long Term Goals - 06/21/21 1327       PT LONG TERM GOAL #1   Title Pt will perform progression of HEP and walking program, including verbalizing plans for optimal fitness plan upon d/c from PT.  ALL LTGS DUE 07/14/21    Baseline will benefit from review    Time 4    Period Weeks    Status On-going    Target Date 07/14/21      PT LONG TERM GOAL #3   Title Pt will improve gait velocity to at least 2.25 ft/sec for improved gait efficiency in the community and safety.    Baseline 1.86 ft/sec with rollator; 15.43 seconds = 2.12 ft/sec on 05/12/21 > 2.18 ft/sec with rollator on 10/3    Time 4    Period Weeks    Status Not Met      PT LONG TERM GOAL #5   Title Pt and pt's husband will verbalize understanding of local PD resources.    Baseline info provided by OT today, 05/14/21, will benefit from review before D/C    Time 4    Period Weeks    Status On-going                   Plan - 06/21/21 1328     Clinical Impression Statement Continued to assess LTG with re-assessment of gait velocity with rollator.  Gait velocity only slightly improved from previous assessment but not to goal.  Continued  to review   exercise recommendations for home and discussed optimal home set up to ensure pt compliance.  Pt to trial new set up for floor bike before final session this week.    Personal Factors and Comorbidities Comorbidity 3+;Age    Comorbidities PMH:  HTN, DM, hypothyroidism, orthostatic hypotension    Examination-Activity Limitations Locomotion Level;Stand;Transfers;Stairs    Examination-Participation Restrictions Community Activity;Other   Plans for travel in retirement   Stability/Clinical Decision Making Evolving/Moderate complexity    Rehab Potential Good    PT Frequency 2x / week   anticipate only 2 visits for next weke   PT Duration 4 weeks    PT Treatment/Interventions ADLs/Self Care Home Management;DME Instruction;Neuromuscular re-education;Balance training;Therapeutic exercise;Therapeutic activities;Functional mobility training;Gait training;Patient/family education;Manual techniques;Stair training;Orthotic Fit/Training    PT Next Visit Plan Was she able to try the new set up for the floor bike to help with sit >stand?  finalize pt's HEP and D/C. schedule for return PT/OT evals and screens in 6 months    Consulted and Agree with Plan of Care Patient             Patient will benefit from skilled therapeutic intervention in order to improve the following deficits and impairments:  Abnormal gait, Difficulty walking, Decreased balance, Decreased mobility, Decreased strength, Postural dysfunction, Decreased activity tolerance, Decreased endurance, Decreased coordination  Visit Diagnosis: Other symptoms and signs involving the nervous system  Other abnormalities of gait and mobility  Unsteadiness on feet  Muscle weakness (generalized)     Problem List Patient Active Problem List   Diagnosis Date Noted   Pain due to onychomycosis of toenails of both feet 06/17/2020   Parkinson's disease ([facility]) 04/23/2020   Leg swelling 11/26/2019   Chronic venous insufficiency 11/26/2019    Vitamin B12 deficiency 02/06/2018   Sepsis secondary to UTI ([place]) 01/29/2018   Microcytic anemia 01/29/2018   Renal insufficiency 01/29/2018   Pure hypercholesterolemia 12/24/2014   Diabetes mellitus type 2, controlled, without complications ([place]) 69/67/8938   Bruit of right carotid artery 12/24/2014   Thyroid disease 07/06/2014   Severe obesity (BMI >= 40) ([place]) 07/06/2014   Essential hypertension 07/06/2014   Rico Junker, PT, DPT 06/21/21    1:35 PM    [facility] [address] [place], [place], [address] Phone: [phone]   Fax:  [phone]  Name: Anna Bernard MRN: [id number] Date of Birth: 1951/06/22

## 2021-06-23 ENCOUNTER — Ambulatory Visit: Payer: Medicare Other | Admitting: Occupational Therapy

## 2021-06-23 ENCOUNTER — Ambulatory Visit: Payer: Medicare Other | Admitting: Physical Therapy

## 2021-06-23 ENCOUNTER — Encounter: Payer: Self-pay | Admitting: Physical Therapy

## 2021-06-23 ENCOUNTER — Other Ambulatory Visit: Payer: Self-pay

## 2021-06-23 DIAGNOSIS — M25611 Stiffness of right shoulder, not elsewhere classified: Secondary | ICD-10-CM

## 2021-06-23 DIAGNOSIS — R29818 Other symptoms and signs involving the nervous system: Secondary | ICD-10-CM

## 2021-06-23 DIAGNOSIS — R2681 Unsteadiness on feet: Secondary | ICD-10-CM

## 2021-06-23 DIAGNOSIS — M6281 Muscle weakness (generalized): Secondary | ICD-10-CM

## 2021-06-23 DIAGNOSIS — R278 Other lack of coordination: Secondary | ICD-10-CM

## 2021-06-23 DIAGNOSIS — M25621 Stiffness of right elbow, not elsewhere classified: Secondary | ICD-10-CM

## 2021-06-23 DIAGNOSIS — R2689 Other abnormalities of gait and mobility: Secondary | ICD-10-CM

## 2021-06-23 DIAGNOSIS — R29898 Other symptoms and signs involving the musculoskeletal system: Secondary | ICD-10-CM

## 2021-06-23 DIAGNOSIS — R293 Abnormal posture: Secondary | ICD-10-CM

## 2021-06-23 NOTE — Patient Instructions (Addendum)
Ways to prevent future Parkinson's related complications: 1.   Exercise regularly,  2.   Focus on BIGGER movements during daily activities- really reach overhead, straighten elbows and extend fingers 3.   When dressing or reaching for your seatbelt make sure to use your body to assist by twisting while you reach-this can help to minimize stress on the shoulder and reduce the risk of a rotator cuff tear    

## 2021-06-23 NOTE — Patient Instructions (Signed)
Access Code: [email or web address] URL: [email or web address] Date: 05/14/2021 Prepared by: Lonia Blood   Exercises Seated March - 1 x daily - 5 x weekly - 1-2 sets - 10 reps Seated Long Arc Quad - 1 x daily - 7 x weekly - 1-2 sets - 10 reps Seated Heel Toe Raises - 1 x daily - 7 x weekly - 1-2 sets - 10 reps Side to side weightshift - 1 x daily - 5 x weekly - 1 sets - 10 reps Staggered Stance Forward Backward Weight Shift with Counter Support - 1 x daily - 5 x weekly - 1 sets - 10 reps   Added 05/14/21 Sit to Stand with Hands on Knees - 1-2 x daily - 5 x weekly - 1 sets - 5 reps   Seated PWR moves.

## 2021-06-23 NOTE — Therapy (Addendum)
[facility] [address] [place], [place], [address] Phone: [phone]   Fax:  [phone]  Physical Therapy Treatment/Discharge Summary  Patient Details  Name: Anna Bernard MRN: [id number] Date of Birth: Dec 07, 1950 Referring Provider (PT): Alonza Bogus, DO   Encounter Date: 06/23/2021   PT End of Session - 06/23/21 1447     Visit Number 19    Number of Visits 21    Date for PT Re-Evaluation 20/25/42   per re-cert on 03/24/22   Authorization Type UHC Medicare    Progress Note Due on Visit 20    PT Start Time 1400    PT Stop Time 1438    PT Time Calculation (min) 38 min    Equipment Utilized During Treatment Gait belt    Activity Tolerance Patient tolerated treatment well    Behavior During Therapy WFL for tasks assessed/performed;Flat affect             Past Medical History:  Diagnosis Date   Anemia, unspecified    Hyperlipidemia, unspecified    Hypertension    Hypothyroidism, unspecified    Pre-diabetes    Vitamin B12 deficiency     Past Surgical History:  Procedure Laterality Date   ABDOMINAL HYSTERECTOMY     GIVENS CAPSULE STUDY N/A 04/16/2018   Procedure: GIVENS CAPSULE STUDY;  Surgeon: Carol Ada, MD;  Location: [facility];  Service: Endoscopy;  Laterality: N/A;   PARS PLANA VITRECTOMY Right 08/16/2020   Procedure: 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;  Surgeon: Jalene Mullet, MD;  Location: [facility];  Service: Ophthalmology;  Laterality: Right;   PARS PLANA VITRECTOMY Right 09/29/2020   Procedure: PARS PLANA VITRECTOMY WITH 25 GAUGE - ENDOCAUTRY;  Surgeon: Jalene Mullet, MD;  Location: [facility];  Service: Ophthalmology;  Laterality: Right;   PHOTOCOAGULATION WITH LASER Right 08/16/2020   Procedure: PHOTOCOAGULATION WITH LASER;  Surgeon: Jalene Mullet, MD;  Location: [facility];  Service: Ophthalmology;  Laterality: Right;   PHOTOCOAGULATION WITH LASER Right  09/29/2020   Procedure: PHOTOCOAGULATION WITH LASER;  Surgeon: Jalene Mullet, MD;  Location: [facility];  Service: Ophthalmology;  Laterality: Right;   SHOULDER SURGERY     TONSILLECTOMY     TUBAL LIGATION      There were no vitals filed for this visit.   Subjective Assessment - 06/23/21 1403     Subjective Hasn't tried the technique yet to get into the peddle bike. Her husband brought in a picture of the peddle bike.    Pertinent History Dx with Parkinson's disease (Dr. Carles Collet) 04/2020; PMH of HTN, DM, hypothyroidism, orthostatic hypotension    Limitations Walking    Patient Stated Goals wants to get back on track with an exercise program.    Currently in Pain? Yes    Pain Score 3     Pain Location Hip    Pain Orientation Left    Pain Descriptors / Indicators Aching    Pain Type Chronic pain    Pain Onset More than a month ago                               [facility] Adult PT Treatment/Exercise - 06/23/21 0001       Therapeutic Activites    Therapeutic Activities Other Therapeutic Activities    Other Therapeutic Activities Practiced set up with bike set up for home (putting bike beside table, turn chair to face bike  beside table to allow pt to continue to sit on the side of the chair and swivel around to use the bike and then turn sideways and push up from table to stand) as pt has not yet had the chance to practice.  Set up bike and chair at clinic table and simulated transfer with backing up to chair and performing PWR step to bring legs to center.  When coming back up to stand, pt pressing up from mat table and then turning towards rollator. Practiced x2 reps. Pt has written set up and drawing for home (given by PT at last session). Brought [name] (pt's spouse) back to session for review and set up for home. Discussed that this is pt's D/C day and that pt will return in 6 months for a return evaluation, both in agreement with plan. Reviewed importance of continuing with HEP and  walking program upon D/C.            Pt performs PWR! Moves in sitting position (reviewed from HEP)  PWR! Up for improved posture x10 reps   PWR! Rock for improved weighshifting x10 reps reaching across body, cues for elbow extension.   PWR! Twist for improved trunk rotation x10 reps bilat, turning and reaching behind body  PWR! Step for improved step initiation x10 reps single step out and in.   Cues provided for technique and intensity of movement.    Pt trying to button her shirt, performed 2 x 10 reps PWR Hand Flicks bilat before trying, pt able to perform on R side, needing assist on L side          PT Education - 06/23/21 1446     Education Details Reviewed local PD resources handout given from OT this session    Person(s) Educated Patient    Methods Explanation    Comprehension Verbalized understanding             PHYSICAL THERAPY DISCHARGE SUMMARY  Visits from Start of Care: 19  Current functional level related to goals / functional outcomes: See LTGs.   Remaining deficits: Impaired balance, gait abnormalities, decr strength, postural abnormalities, bradykinesia.   Education / Equipment: HEP, PD community resouces.   Patient agrees to discharge. Patient goals were partially met. Patient is being discharged due to meeting the stated rehab goals.    PT Short Term Goals - 05/20/21 1331       PT SHORT TERM GOAL #1   Title ALL STGS = LTGS               PT Long Term Goals - 06/23/21 1409       PT LONG TERM GOAL #1   Title Pt will perform progression of HEP and walking program, including verbalizing plans for optimal fitness plan upon d/c from PT.  ALL LTGS DUE 07/14/21    Baseline pt verbalizing understanding of performing HEP/walking program/seated bike, pt reports it is just a matter of doing them, have previously printed out an exercise chair for pt and pt reports her husband will help with compliance.    Time 4    Period Weeks     Status Achieved      PT LONG TERM GOAL #3   Title Pt will improve gait velocity to at least 2.25 ft/sec for improved gait efficiency in the community and safety.    Baseline 1.86 ft/sec with rollator; 15.43 seconds = 2.12 ft/sec on 05/12/21 > 2.18 ft/sec with rollator on 10/3    Time  4    Period Weeks    Status Not Met      PT LONG TERM GOAL #5   Title Pt and pt's husband will verbalize understanding of local PD resources.    Baseline OT provided recent copy on 06/23/21    Time 4    Period Weeks    Status Achieved                   Plan - 06/23/21 2052     Clinical Impression Statement Assessed remainder of pt's LTGs today. Reviewed local PD resources that was given at OT session (updated for month of October). Reviewed pt's HEP and optimal fitness program after D/C with pt reporting that she understands importance of exercise, but is just a matter of doing them. Pt's husband has been helping with exercises for incr carryover at home. Reviewed optimal home set up for use of seated stepper at home for aerobic activity. Pt will be discharged at this time and plan for return OT/PT evals in 6 months. Pt and pt's spouse in agreement with plan.    Personal Factors and Comorbidities Comorbidity 3+;Age    Comorbidities PMH:  HTN, DM, hypothyroidism, orthostatic hypotension    Examination-Activity Limitations Locomotion Level;Stand;Transfers;Stairs    Examination-Participation Restrictions Community Activity;Other   Plans for travel in retirement   Stability/Clinical Decision Making Evolving/Moderate complexity    Rehab Potential Good    PT Frequency 2x / week   anticipate only 2 visits for next weke   PT Duration 4 weeks    PT Treatment/Interventions ADLs/Self Care Home Management;DME Instruction;Neuromuscular re-education;Balance training;Therapeutic exercise;Therapeutic activities;Functional mobility training;Gait training;Patient/family education;Manual techniques;Stair  training;Orthotic Fit/Training    PT Next Visit Plan D/C    Consulted and Agree with Plan of Care Patient             Patient will benefit from skilled therapeutic intervention in order to improve the following deficits and impairments:  Abnormal gait, Difficulty walking, Decreased balance, Decreased mobility, Decreased strength, Postural dysfunction, Decreased activity tolerance, Decreased endurance, Decreased coordination  Visit Diagnosis: Other symptoms and signs involving the nervous system  Muscle weakness (generalized)  Other abnormalities of gait and mobility     Problem List Patient Active Problem List   Diagnosis Date Noted   Pain due to onychomycosis of toenails of both feet 06/17/2020   Parkinson's disease ([place]) 04/23/2020   Leg swelling 11/26/2019   Chronic venous insufficiency 11/26/2019   Vitamin B12 deficiency 02/06/2018   Sepsis secondary to UTI ([place]) 01/29/2018   Microcytic anemia 01/29/2018   Renal insufficiency 01/29/2018   Pure hypercholesterolemia 12/24/2014   Diabetes mellitus type 2, controlled, without complications ([facility]) 67/59/1638   Bruit of right carotid artery 12/24/2014   Thyroid disease 07/06/2014   Severe obesity (BMI >= 40) ([place]) 07/06/2014   Essential hypertension 07/06/2014    Arliss Journey, PT, DPT  06/23/2021, 8:55 PM  [facility] [address] [place], [place], [address] Phone: [phone]   Fax:  [phone]  Name: Anna Bernard MRN: [id number] Date of Birth: 1951-08-18

## 2021-06-25 ENCOUNTER — Ambulatory Visit (INDEPENDENT_AMBULATORY_CARE_PROVIDER_SITE_OTHER): Payer: Medicare Other | Admitting: Podiatry

## 2021-06-25 ENCOUNTER — Encounter: Payer: Self-pay | Admitting: Podiatry

## 2021-06-25 ENCOUNTER — Other Ambulatory Visit: Payer: Self-pay

## 2021-06-25 DIAGNOSIS — N289 Disorder of kidney and ureter, unspecified: Secondary | ICD-10-CM

## 2021-06-25 DIAGNOSIS — E119 Type 2 diabetes mellitus without complications: Secondary | ICD-10-CM

## 2021-06-25 DIAGNOSIS — B351 Tinea unguium: Secondary | ICD-10-CM

## 2021-06-25 DIAGNOSIS — M79674 Pain in right toe(s): Secondary | ICD-10-CM

## 2021-06-25 DIAGNOSIS — M79675 Pain in left toe(s): Secondary | ICD-10-CM | POA: Diagnosis not present

## 2021-06-25 NOTE — Therapy (Signed)
[facility] [address] [place], [place], [address] Phone: [phone]   Fax:  [phone]  Occupational Therapy Treatment  Patient Details  Name: Anna Bernard MRN: [id number] Date of Birth: 08-18-1951 Referring Provider (OT): Dr. Carles Collet   Encounter Date: 06/23/2021   OT End of Session - 06/25/21 1446     Visit Number 17    Number of Visits 25    Date for OT Re-Evaluation 07/09/21    Authorization Type UHC Medicare    Authorization - Visit Number 23    OT Start Time 1318    OT Stop Time 1358    OT Time Calculation (min) 40 min    Activity Tolerance Patient tolerated treatment well    Behavior During Therapy WFL for tasks assessed/performed;Flat affect             Past Medical History:  Diagnosis Date   Anemia, unspecified    Hyperlipidemia, unspecified    Hypertension    Hypothyroidism, unspecified    Pre-diabetes    Vitamin B12 deficiency     Past Surgical History:  Procedure Laterality Date   ABDOMINAL HYSTERECTOMY     GIVENS CAPSULE STUDY N/A 04/16/2018   Procedure: GIVENS CAPSULE STUDY;  Surgeon: Carol Ada, MD;  Location: [facility];  Service: Endoscopy;  Laterality: N/A;   PARS PLANA VITRECTOMY Right 08/16/2020   Procedure: 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;  Surgeon: Jalene Mullet, MD;  Location: [facility];  Service: Ophthalmology;  Laterality: Right;   PARS PLANA VITRECTOMY Right 09/29/2020   Procedure: PARS PLANA VITRECTOMY WITH 25 GAUGE - ENDOCAUTRY;  Surgeon: Jalene Mullet, MD;  Location: [facility];  Service: Ophthalmology;  Laterality: Right;   PHOTOCOAGULATION WITH LASER Right 08/16/2020   Procedure: PHOTOCOAGULATION WITH LASER;  Surgeon: Jalene Mullet, MD;  Location: [facility];  Service: Ophthalmology;  Laterality: Right;   PHOTOCOAGULATION WITH LASER Right 09/29/2020   Procedure: PHOTOCOAGULATION WITH LASER;  Surgeon: Jalene Mullet, MD;  Location: [facility];  Service: Ophthalmology;  Laterality: Right;   SHOULDER SURGERY     TONSILLECTOMY     TUBAL LIGATION      There were no vitals filed for this visit.   Subjective Assessment - 06/25/21 1446     Subjective  Pt agrees with d/c    Pertinent History Pt is a 70 y.o. female with recent Parkinson's disease diagnosis.  Pt with PMH that includes:anemia, hyperlipidemia, orthostatic hypotension, HTN, hypothyroidism, Vitamin B12 deficiency, hx of R RTC surgery, leg swelling, arthritis (per pt), obesity    Currently in Pain? Yes    Pain Score 3     Pain Location Hip    Pain Orientation Left    Pain Descriptors / Indicators Aching    Pain Type Chronic pain    Pain Onset More than a month ago    Pain Frequency Intermittent    Aggravating Factors  not taking meds    Pain Relieving Factors meds                                  OT Treatment/ Education - 06/25/21 1448     Education Details Preventing future complication  and PD community resources, reviewed bag exercises, for simulated ADLs and LE stretches, checked progress towards goals    Person(s) Educated Patient    Methods Explanation;Verbal cues;Handout    Comprehension Verbalized understanding;Returned demonstration  OT Short Term Goals - 06/23/21 1349       OT SHORT TERM GOAL #1   Title Pt will be independent with PD-specific HEP--check STGs 05/14/21    Time 4    Period Weeks    Status Achieved   PWR! moves supine and coordination activities   Target Date 05/14/21      OT SHORT TERM GOAL #2   Title Pt will verbalize understanding of adapted strategies to maximize safety and I with ADLs/ IADLs .    Time 4    Period Weeks    Status Achieved      OT SHORT TERM GOAL #3   Title Pt will improve R hand coordination for ADLs as shown by improving time on 9-hole peg test by at least 3 secs    Baseline 44.34    Time 4    Period Weeks    Status Not Met   41.54.  06/21/21:  50.35sec     OT  SHORT TERM GOAL #4   Title Pt will demonstrate improved ease with dressing as evidenced by  performing  PPT#4 donning/ doffing 18 secs or less    Baseline 25 secs    Time 4    Period Weeks    Status Not Met   06/23/21-50 secs              OT Long Term Goals - 06/23/21 1342       OT LONG TERM GOAL #1   Title Pt will verbalize understanding of ways to prevent future PD related complications and PD community resources.    Time 12    Period Weeks    Status Achieved      OT LONG TERM GOAL #2   Title Pt will verbalize  understanding of ways to keep thinking skills sharp.    Time 12    Period Weeks    Status Achieved   06/21/21:  met     OT LONG TERM GOAL #3   Title Pt will retrieve a lightweight object from overhead shelf with RUE with -10 elbow extension.    Time 12    Period Weeks    Status Not Met   06/21/21:  -20*     OT LONG TERM GOAL #4   Title Pt will perform LB dressing consistently mod I.    Time 12    Period Weeks    Status Partially Met   06/21/21:  pt able to perform with incr time, but husband helps if in a hurry/time constraints     OT LONG TERM GOAL #5   Title --                   Plan - 06/25/21 1442     Clinical Impression Statement Pt demonstrates progress towards goals. She agrees with plans for d/c    OT Occupational Profile and History Problem Focused Assessment - Including review of records relating to presenting problem    Occupational performance deficits (Please refer to evaluation for details): ADL's;IADL's;Leisure;Social Participation    Body Structure / Function / Physical Skills ADL;UE functional use;Balance;Flexibility;FMC;Gait;ROM;Coordination;GMC;Decreased knowledge of precautions;Decreased knowledge of use of DME;IADL;Dexterity;Strength;Mobility    Rehab Potential Good    OT Frequency 2x / week    OT Duration 12 weeks    OT Treatment/Interventions Self-care/ADL training;Energy conservation;Patient/family education;DME and/or AE  instruction;Aquatic Therapy;Balance training;Fluidtherapy;Therapist, nutritional;Therapeutic exercise;Moist Heat;Manual Therapy;Therapeutic activities;Neuromuscular education    Plan d/c OT    Consulted and Agree  with Plan of Care Patient             Patient will benefit from skilled therapeutic intervention in order to improve the following deficits and impairments:   Body Structure / Function / Physical Skills: ADL, UE functional use, Balance, Flexibility, FMC, Gait, ROM, Coordination, GMC, Decreased knowledge of precautions, Decreased knowledge of use of DME, IADL, Dexterity, Strength, Mobility       Visit Diagnosis: Other symptoms and signs involving the nervous system  Other abnormalities of gait and mobility  Unsteadiness on feet  Muscle weakness (generalized)  Abnormal posture  Other symptoms and signs involving the musculoskeletal system  Other lack of coordination  Stiffness of right elbow, not elsewhere classified  Stiffness of right shoulder, not elsewhere classified  OCCUPATIONAL THERAPY DISCHARGE SUMMARY   Current functional level related to goals / functional outcomes: Pt made good overall progress towards goals, however she did not fully achieve all goals due to severity of deficits.   Remaining deficits: Bradykinesia, decreased coordination, decreased balance, decreased functional mobility, rigidity,    Education / Equipment: Pt was instructed in ADL strategies, community resources and PD specific HEP. Pt demonstrates understanding.   Patient agrees to discharge. Patient goals were partially met. Patient is being discharged due to being pleased with the current functional level..     Problem List Patient Active Problem List   Diagnosis Date Noted   Pain due to onychomycosis of toenails of both feet 06/17/2020   Parkinson's disease ([place]) 04/23/2020   Leg swelling 11/26/2019   Chronic venous insufficiency 11/26/2019   Vitamin B12  deficiency 02/06/2018   Sepsis secondary to UTI ([place]) 01/29/2018   Microcytic anemia 01/29/2018   Renal insufficiency 01/29/2018   Pure hypercholesterolemia 12/24/2014   Diabetes mellitus type 2, controlled, without complications ([place]) 78/46/9629   Bruit of right carotid artery 12/24/2014   Thyroid disease 07/06/2014   Severe obesity (BMI >= 40) ([place]) 07/06/2014   Essential hypertension 07/06/2014    Vondra Aldredge, OT/L 06/25/2021, 2:48 PM  [facility] [address] [place], [place], [address] Phone: [phone]   Fax:  [phone]  Name: Anna Bernard MRN: [id number] Date of Birth: 1951-01-03

## 2021-06-25 NOTE — Progress Notes (Signed)
This patient returns to my office for at risk foot care.  This patient requires this care by a professional since this patient will be at risk due to having diabetes, chronic kidney disease.   This patient is unable to cut nails herself since the patient cannot reach her nails.These nails are painful walking and wearing shoes.  This patient presents for at risk foot care today.  General Appearance  Alert, conversant and in no acute stress.  Vascular  Dorsalis pedis pulses are palpable  bilaterally. Posterior tibial pulses are absent. Capillary return is within normal limits  bilaterally. Cold feet bilaterally. Absent digital hair.  Neurologic  Senn-Weinstein monofilament wire test within normal limits left foot.  LOPS right absent.. Muscle power within normal limits bilaterally.  Nails Thick disfigured discolored nails with subungual debris  from hallux to fifth toes bilaterally. No evidence of bacterial infection or drainage bilaterally.  Orthopedic  No limitations of motion  feet .  No crepitus or effusions noted.  No bony pathology or digital deformities noted. PTTD right foot.  DJD right reardoot medially.  Skin  normotropic skin with no porokeratosis noted bilaterally.  No signs of infections or ulcers noted.     Onychomycosis  Pain in right toes  Pain in left toes  Consent was obtained for treatment procedures.   Mechanical debridement of nails 1-5  bilaterally performed with a nail nipper.  Filed with dremel without incident.    Return office visit  3 months                    Told patient to return for periodic foot care and evaluation due to potential at risk complications.   Laval Cafaro DPM  

## 2021-06-28 NOTE — Progress Notes (Signed)
[place],   SUBJECTIVE:   CHIEF COMPLAINT / HPI:   Anna Bernard is a 70 yo new patient who presents to establish care.  He does not have her pill medications with her.  PMH HTN - many years ago, checks at home, unsure what it usually runs T2DM-she is unsure if she has been diagnosed with diabetes, per chart review she has had some prediabetic A1c's but I do not see any greater then 6.5 Thyroid disease-she believes she is taking Synthroid but is not sure Parkinsons disease-diagnosed in the last few months by Dr. Arbutus Leas, and neurology, has been doing outpatient rehab for her muscle weakness and takes carbidopa levodopa Onchomycosis- follows with podiatry Microcytic anemia iron deficiency- last ferritin 08/2020 was 13, normal EGD and colonoscopy in 03/2018 Diverticulosis B12 deficiency -patient is unsure if she had ever had this diagnosis Obesity Elevated cholesterol Right leg swelling-was seen by vascular surgery in March 2021 with no evidence of DVT but significant deep reflux, conservative measures were recommended including compression stockings  Of note, patient is taking potassium and Lasix but she does not know why.  She does not think she has a history of heart failure.  Echo in 2020 with normal LVEF and grade 1 diastolic dysfunction.  Denies any shortness of breath, chest pain.  Notes some leg swelling, right greater than left that has been present for several years.  She notes she is being seen for this.  She also notes she sees a urologist but does not know why has a follow-up appointment in several months, will bring a copy of records  Surgical Hx Hysterectomy-she believes was for heavy bleeding  Obstetrical history G2 P1-0-1-1-she notes she has a 43 year old daughter who lives in [place]  Family Hx No family history of cardiac disease, stroke or other diseases  Social Hx Smoked 1 year in her 79s, none since.  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  Neuro: Normal gait, moves all four extremities appropriately. Psych: Appropriate mood and affect   ASSESSMENT/PLAN:   Diabetes mellitus type 2, controlled, without complications (HCC) - Again, A1c within normal limits today and no elevated A1c noted in chart  Essential hypertension - Mildly elevated today, patient unsure of her home medications, will bring to follow-up appointment in 2 weeks, checking BMP today  Parkinson's disease (H[facility]) - Following with neurology, on carbidopa levodopa  Chronic venous insufficiency Of right leg, compression stockings recommended per vascular - Unsure if this is why patient has been started on Lasix, she is going to bring medications to next appointment to confirm but we may consider discontinuing if there is not another reason for the Lasix  Microcytic anemia We will check CBC and ferritin today as well as B12 as she has a history of B12 deficiency in her chart  Pure hypercholesterolemia - We will check lipid panel today, she will bring medications next visit  as she believes she is on a statin but she is not sure  Thyroid disease - We will check TSH today and patient will bring medications to next appointment to confirm     Billey Co, MD [facility]

## 2021-06-29 ENCOUNTER — Encounter: Payer: Self-pay | Admitting: Family Medicine

## 2021-06-29 ENCOUNTER — Ambulatory Visit (INDEPENDENT_AMBULATORY_CARE_PROVIDER_SITE_OTHER): Payer: Medicare Other

## 2021-06-29 ENCOUNTER — Other Ambulatory Visit: Payer: Self-pay

## 2021-06-29 ENCOUNTER — Ambulatory Visit (INDEPENDENT_AMBULATORY_CARE_PROVIDER_SITE_OTHER): Payer: Medicare Other | Admitting: Family Medicine

## 2021-06-29 VITALS — BP 147/69 | HR 86 | Ht 61.0 in | Wt 169.0 lb

## 2021-06-29 DIAGNOSIS — E7800 Pure hypercholesterolemia, unspecified: Secondary | ICD-10-CM | POA: Diagnosis not present

## 2021-06-29 DIAGNOSIS — E119 Type 2 diabetes mellitus without complications: Secondary | ICD-10-CM | POA: Diagnosis not present

## 2021-06-29 DIAGNOSIS — G20 Parkinson's disease: Secondary | ICD-10-CM

## 2021-06-29 DIAGNOSIS — D509 Iron deficiency anemia, unspecified: Secondary | ICD-10-CM

## 2021-06-29 DIAGNOSIS — I10 Essential (primary) hypertension: Secondary | ICD-10-CM | POA: Diagnosis not present

## 2021-06-29 DIAGNOSIS — I872 Venous insufficiency (chronic) (peripheral): Secondary | ICD-10-CM

## 2021-06-29 DIAGNOSIS — Z23 Encounter for immunization: Secondary | ICD-10-CM

## 2021-06-29 DIAGNOSIS — E079 Disorder of thyroid, unspecified: Secondary | ICD-10-CM

## 2021-06-29 LAB — POCT GLYCOSYLATED HEMOGLOBIN (HGB A1C): Hemoglobin A1C: 4.9 % (ref 4.0–5.6)

## 2021-06-29 NOTE — Patient Instructions (Signed)
It was wonderful to see you today.  Please bring ALL of your medications with you to every visit.   Today we talked about:  - Lab work today - please bring your medications to your next appt and make an appt in 1-2 weeks   Thank you for choosing [facility].   Please call [phone] with any questions about today's appointment.  Please be sure to schedule follow up at the front  desk before you leave today.   Burley Saver, MD  Family Medicine

## 2021-06-29 NOTE — Assessment & Plan Note (Signed)
-   Mildly elevated today, patient unsure of her home medications, will bring to follow-up appointment in 2 weeks, checking BMP today

## 2021-06-29 NOTE — Assessment & Plan Note (Signed)
-   Again, A1c within normal limits today and no elevated A1c noted in chart

## 2021-06-29 NOTE — Assessment & Plan Note (Signed)
-   We will check TSH today and patient will bring medications to next appointment to confirm

## 2021-06-29 NOTE — Assessment & Plan Note (Signed)
We will check CBC and ferritin today as well as B12 as she has a history of B12 deficiency in her chart

## 2021-06-29 NOTE — Assessment & Plan Note (Signed)
Of right leg, compression stockings recommended per vascular - Unsure if this is why patient has been started on Lasix, she is going to bring medications to next appointment to confirm but we may consider discontinuing if there is not another reason for the Lasix

## 2021-06-29 NOTE — Assessment & Plan Note (Signed)
-   Following with neurology, on carbidopa levodopa

## 2021-06-29 NOTE — Assessment & Plan Note (Signed)
-   We will check lipid panel today, she will bring medications next visit as she believes she is on a statin but she is not sure

## 2021-06-30 LAB — COMPREHENSIVE METABOLIC PANEL
ALT: 5 IU/L (ref 0–32)
AST: 16 IU/L (ref 0–40)
Albumin/Globulin Ratio: 1.4 (ref 1.2–2.2)
Albumin: 4.1 g/dL (ref 3.8–4.8)
Alkaline Phosphatase: 73 IU/L (ref 44–121)
BUN/Creatinine Ratio: 18 (ref 12–28)
BUN: 13 mg/dL (ref 8–27)
Bilirubin Total: 0.3 mg/dL (ref 0.0–1.2)
CO2: 28 mmol/L (ref 20–29)
Calcium: 9.3 mg/dL (ref 8.7–10.3)
Chloride: 104 mmol/L (ref 96–106)
Creatinine, Ser: 0.71 mg/dL (ref 0.57–1.00)
Globulin, Total: 2.9 g/dL (ref 1.5–4.5)
Glucose: 73 mg/dL (ref 70–99)
Potassium: 4.2 mmol/L (ref 3.5–5.2)
Sodium: 144 mmol/L (ref 134–144)
Total Protein: 7 g/dL (ref 6.0–8.5)
eGFR: 93 mL/min/{1.73_m2} (ref >59)

## 2021-06-30 LAB — CBC
Hematocrit: 31.3 % — ABNORMAL LOW (ref 34.0–46.6)
Hemoglobin: 9.7 g/dL — ABNORMAL LOW (ref 11.1–15.9)
MCH: 24.6 pg — ABNORMAL LOW (ref 26.6–33.0)
MCHC: 31 g/dL — ABNORMAL LOW (ref 31.5–35.7)
MCV: 79 fL (ref 79–97)
Platelets: 324 10*3/uL (ref 150–450)
RBC: 3.95 x10E6/uL (ref 3.77–5.28)
RDW: 16.6 % — ABNORMAL HIGH (ref 11.7–15.4)
WBC: 5.3 10*3/uL (ref 3.4–10.8)

## 2021-06-30 LAB — FERRITIN: Ferritin: 20 ng/mL (ref 15–150)

## 2021-06-30 LAB — LIPID PANEL
Chol/HDL Ratio: 2.3 ratio (ref 0.0–4.4)
Cholesterol, Total: 160 mg/dL (ref 100–199)
HDL: 69 mg/dL (ref >39)
LDL Chol Calc (NIH): 79 mg/dL (ref 0–99)
Triglycerides: 57 mg/dL (ref 0–149)
VLDL Cholesterol Cal: 12 mg/dL (ref 5–40)

## 2021-06-30 LAB — TSH: TSH: 1.92 u[IU]/mL (ref 0.450–4.500)

## 2021-06-30 LAB — VITAMIN B12: Vitamin B-12: 793 pg/mL (ref 232–1245)

## 2021-07-01 ENCOUNTER — Telehealth: Payer: Self-pay

## 2021-07-01 ENCOUNTER — Other Ambulatory Visit: Payer: Self-pay | Admitting: Family Medicine

## 2021-07-01 DIAGNOSIS — D509 Iron deficiency anemia, unspecified: Secondary | ICD-10-CM

## 2021-07-01 MED ORDER — FERROUS SULFATE 325 (65 FE) MG PO TABS: 325.0000 mg | ORAL_TABLET | Freq: Every day | ORAL | 3 refills | Status: DC

## 2021-07-01 NOTE — Telephone Encounter (Signed)
Patient and husband return call to nurse line to discuss results. Provided with results per result note. Patient reports that she does not believe she has been taking an iron tablet.   Spoke with Dr. Miquel Dunn who will send over rx ferrous sulfate to pharmacy later this afternoon. Informed patient.   Please send medication to [facility] on [address].   Veronda Prude, RN

## 2021-07-09 ENCOUNTER — Other Ambulatory Visit: Payer: Self-pay

## 2021-07-09 ENCOUNTER — Encounter: Payer: Self-pay | Admitting: Family Medicine

## 2021-07-09 ENCOUNTER — Ambulatory Visit (INDEPENDENT_AMBULATORY_CARE_PROVIDER_SITE_OTHER): Payer: Medicare Other | Admitting: Family Medicine

## 2021-07-09 VITALS — BP 134/63 | HR 86 | Ht 61.0 in | Wt 171.2 lb

## 2021-07-09 DIAGNOSIS — L821 Other seborrheic keratosis: Secondary | ICD-10-CM

## 2021-07-09 DIAGNOSIS — L700 Acne vulgaris: Secondary | ICD-10-CM | POA: Diagnosis not present

## 2021-07-09 DIAGNOSIS — Z23 Encounter for immunization: Secondary | ICD-10-CM

## 2021-07-09 DIAGNOSIS — D509 Iron deficiency anemia, unspecified: Secondary | ICD-10-CM

## 2021-07-09 NOTE — Assessment & Plan Note (Signed)
-   removed today with gentle pressure, tolerated well

## 2021-07-09 NOTE — Assessment & Plan Note (Signed)
-   taking her PO iron, Hgb stable, has had complete GI work up in 2019 and hysterectomy, continue her home iron

## 2021-07-09 NOTE — Progress Notes (Signed)
    SUBJECTIVE:   CHIEF COMPLAINT / HPI:   Iron deficiency anemia- had hysterectomy for heavy bleeding in the past. Had colonoscopy showing diverticulosis in 2019 with Dr Elnoria Howard.  Also had normal EGD, capsule endoscopy showed non-bleeding AVM and noted that could try for ablation if unable to keep up with anemia. She picked up her iron tabs and has been taking them. Denies chest pain, palpitations.  Blackhead- on right clavicle, getting larger, would like it removed.  Spots on back- not itching or hurting. Getting larger, present for the past year, son would like them checked out.   OBJECTIVE:   BP 134/63   Pulse 86   Ht 5\' 1"  (1.549 m)   Wt 171 lb 3.2 oz (77.7 kg)   SpO2 100%   BMI 32.35 kg/m   General: A&O, NAD HEENT: No sign of trauma, EOM grossly intact Cardiac: RRR, no m/r/g Respiratory: CTAB, normal WOB, no w/c/r GI: non-distended  Neuro: walks with walker Psych: Appropriate mood and affect Skin: 0.25 cm raised blackhead on right clavicle, several stuck on dark brown raised non-scaly lesions on back   ASSESSMENT/PLAN:   Microcytic anemia - taking her PO iron, Hgb stable, has had complete GI work up in 2019 and hysterectomy, continue her home iron  Blackhead - removed today with gentle pressure, tolerated well  Seborrheic keratoses - benign, not bothering her, no need for intevention, provided reassurance    Flu shot given today. She states she had the Shingles vaccine in her 61s.  51s, MD [facility]

## 2021-07-09 NOTE — Assessment & Plan Note (Signed)
-   benign, not bothering her, no need for intevention, provided reassurance

## 2021-07-09 NOTE — Patient Instructions (Addendum)
It was wonderful to see you today.  Please bring ALL of your medications with you to every visit.   Today we talked about:  -We gave you your flu shot - Continue taking your iron pills, please bring your medications to your next appointment - If you start having any chest pain, shortness of breath, worsening leg swelling, palpitations please let me know   Thank you for choosing [facility].   Please call [phone] with any questions about today's appointment.  Please be sure to schedule follow up at the front  desk before you leave today.   Burley Saver, MD  Family Medicine

## 2021-07-21 ENCOUNTER — Telehealth: Payer: Self-pay | Admitting: Neurology

## 2021-07-21 NOTE — Telephone Encounter (Signed)
Called patient and reminded her and her husband to hold medication 24 hours prior to her appt on Friday. They both agreed and understood

## 2021-07-21 NOTE — Telephone Encounter (Signed)
-----   Message from Octaviano Batty Zakaria Sedor, DO sent at 06/14/2021  9:07 AM EDT ----- Remind them to hold levodopa 24 hours prior to on/off test on friday

## 2021-07-21 NOTE — Progress Notes (Signed)
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  This did not show efficacy of levodopa.  However, when I took the patient off of levodopa for 24 hours prior to seeing her today, in anticipation for levodopa challenge test, the patient and her husband reported that she did significantly worse off of the medication.  They would like to stay on the medication and potentially increase it for now.  -Increase carbidopa/levodopa CR, 3 at 7am, 2 at 11 am and 2 at 4pm.    -We discussed differences between vascular parkinsonism and idiopathic Parkinson's disease.  She expressed understanding.  -Discussed the importance of exercise and physical therapy with this disorder.  2.  Hypertension  -Monitoring as is on multiple antihypertensives.     Subjective:   Anna Bernard was seen today in follow up for Parkinsons disease.  My previous records were reviewed prior to todays visit as well as outside records available to me. Husband accompanies the history.  Patient here for levodopa challenge test today.  Patient currently off of levodopa.  She and husband both admit that they did not realize how much it was helping.  She could barely get out of the bed today.  Current prescribed movement disorder medications: Carbidopa/levodopa 25/100 CR, 2 tablets at 7 AM/2 tablets at 11 AM/1 tablet at 4 PM (off today)  PREVIOUS MEDICATIONS: Sinemet  ALLERGIES:  No Known Allergies  CURRENT MEDICATIONS:  Outpatient Encounter Medications as of 07/23/2021  Medication Sig   aspirin 81 MG tablet Take 81 mg by mouth daily.   Carbidopa-Levodopa ER (SINEMET CR) 25-100 MG tablet controlled release 2 tablets at 7am, 2 tablets at 11 am and 1 at 4pm   ferrous sulfate 325 (65 FE) MG tablet Take 1 tablet (325 mg total) by mouth daily with breakfast.   furosemide (LASIX) 20 MG tablet TAKE 1 TABLET BY MOUTH TWICE A DAY (Patient taking differently: Take 40 mg by mouth 2 (two)  times daily.)   KLOR-CON M20 20 MEQ tablet TAKE 1 TABLET BY MOUTH TWICE A DAY (Patient taking differently: Take 20 mEq by mouth daily.)   levothyroxine (SYNTHROID) 150 MCG tablet Take 1 tablet (150 mcg total) by mouth daily.   Multiple Vitamin (MULTIVITAMIN WITH MINERALS) TABS Take 1 tablet by mouth daily.   nystatin (MYCOSTATIN/NYSTOP) powder Apply 1 application topically 2 (two) times daily. (Patient taking differently: Apply 1 application topically every other day.)   olmesartan (BENICAR) 20 MG tablet Take 1 tablet (20 mg total) by mouth daily.   polyethylene glycol powder (GLYCOLAX/MIRALAX) 17 GM/SCOOP powder Take 17 g by mouth 2 (two) times daily as needed for mild constipation or moderate constipation.   rosuvastatin (CRESTOR) 10 MG tablet Take 1 tablet (10 mg total) by mouth daily.   No facility-administered encounter medications on file as of 07/23/2021.    Objective:   PHYSICAL EXAMINATION:    VITALS:   There were no vitals filed for this visit.    GEN:  The patient appears stated age and is in NAD. HEENT:  Normocephalic, atraumatic.  The mucous membranes are moist. The superficial temporal arteries are without ropiness or tenderness. CV:  RRR Lungs:  CTAB Neck/HEME:  There are no carotid bruits bilaterally.  Neurological examination:  Orientation: The patient is alert and oriented x3. Cranial nerves: There is good facial symmetry with facial hypomimia. The speech is fluent and clear. Soft palate rises symmetrically and there is no tongue deviation. Hearing  is intact to conversational tone. Sensation: Sensation is intact to light touch throughout Motor: Strength is at least antigravity x4.  Levodopa challenge done today.  UPDRS motor off score was 33.  Pt then given 300mg  of levodopa dissolved in ginger ale and waited to re-examine her.  UPDRS motor on score was 29.  Details of UPDRS motor score documented on separate neurophysiologic worksheet.     Movement  examination: Tone: There is nl tone in the ue/le Abnormal movements: none Coordination:  There is decremation with RAM's, with any form of RAMS, including alternating supination and pronation of the forearm, hand opening and closing, finger taps, heel taps and toe taps, mostly on the right. Gait and Station: The patient has difficulty arising out of a deep-seated chair without the use of the hands.  She is assisted by the examiner.  She does drag the right leg.  She ambulates slow with short steps with a walker.  The patient's stride length is sig decreased and drags the R leg.  This was treated both before and after administration of levodopa.  I have reviewed and interpreted the following labs independently    Chemistry      Component Value Date/Time   NA 144 06/29/2021 1044   K 4.2 06/29/2021 1044   CL 104 06/29/2021 1044   CO2 28 06/29/2021 1044   BUN 13 06/29/2021 1044   CREATININE 0.71 06/29/2021 1044   CREATININE 0.52 04/21/2013 1526   GLU 133 08/27/2012 0000      Component Value Date/Time   CALCIUM 9.3 06/29/2021 1044   ALKPHOS 73 06/29/2021 1044   AST 16 06/29/2021 1044   ALT 5 06/29/2021 1044   BILITOT 0.3 06/29/2021 1044       Lab Results  Component Value Date   WBC 5.3 06/29/2021   HGB 9.7 (L) 06/29/2021   HCT 31.3 (L) 06/29/2021   MCV 79 06/29/2021   PLT 324 06/29/2021    Lab Results  Component Value Date   TSH 1.920 06/29/2021     Total time spent on today's visit was 95 minutes, including both face-to-face time and nonface-to-face time.  Time included that spent on review of records (prior notes available to me/labs/imaging if pertinent), discussing treatment and goals, answering patient's questions and coordinating care.    Cc:  08/29/2021, MD

## 2021-07-22 ENCOUNTER — Telehealth: Payer: Self-pay | Admitting: Neurology

## 2021-07-22 NOTE — Telephone Encounter (Signed)
Patient's husband called with questions about the patient's medication and the ON/OFF test. He needs to clarify the instructions.

## 2021-07-22 NOTE — Telephone Encounter (Signed)
Patients husband wanted to clarify that he is holding just the Levodopa for tomorrows visit.

## 2021-07-23 ENCOUNTER — Encounter: Payer: Self-pay | Admitting: Neurology

## 2021-07-23 ENCOUNTER — Ambulatory Visit: Payer: Medicare Other | Admitting: Neurology

## 2021-07-23 ENCOUNTER — Other Ambulatory Visit: Payer: Self-pay

## 2021-07-23 VITALS — BP 120/84 | HR 83 | Ht 66.0 in | Wt 171.0 lb

## 2021-07-23 DIAGNOSIS — G20 Parkinson's disease: Secondary | ICD-10-CM

## 2021-07-23 MED ORDER — CARBIDOPA-LEVODOPA ER 25-100 MG PO TBCR: EXTENDED_RELEASE_TABLET | ORAL | 1 refills | Status: DC

## 2021-07-23 NOTE — Patient Instructions (Signed)
Take carbidopa/levodopa 25/100 CR, 3 at 7am, 2 at 11 am, 2 at 4pm

## 2021-07-26 ENCOUNTER — Other Ambulatory Visit: Payer: Self-pay

## 2021-07-26 MED ORDER — CARBIDOPA-LEVODOPA ER 25-100 MG PO TBCR: EXTENDED_RELEASE_TABLET | ORAL | 1 refills | Status: DC

## 2021-07-26 NOTE — Telephone Encounter (Signed)
Medication ha been sent in and patient was called to let them know of the new schedule of 3/2/2

## 2021-09-09 ENCOUNTER — Other Ambulatory Visit: Payer: Self-pay | Admitting: Family Medicine

## 2021-09-09 DIAGNOSIS — D509 Iron deficiency anemia, unspecified: Secondary | ICD-10-CM

## 2021-09-26 NOTE — Progress Notes (Signed)
° ° °  SUBJECTIVE:   CHIEF COMPLAINT / HPI:   Bag of medications brought in today to review.  Med list updated.  She is wanting to know if she can discontinue any of these medications.  Dyspnea on exertion-patient notes a several year history of this, but notes "I feel like SH** last couple of weeks."  When asked to clarify, she notes she feels weak and like she has shortness of breath even with walking shorter distances.  Denies any worsening leg swelling, chest pain, palpitations, orthopnea, vaginal bleeding or blood in stool or urine, decreased p.o. intake, nausea, vomiting, fever, chills.  She denies any cough or sputum production, denies rhinorrhea or congestion.  Of note, she has not been taking her olmesartan or Klor-Con because she believes that it is making her pee a lot.  We discussed that Lasix is actually the medications is probably making her urinate more.  She is wondering if she needs to be on it.  She notes that she did see a heart doctor "a while ago, but he is gone now."  Iron deficiency anemia- she has been taking the ferrous sulfate. Last Hgb 9.7 and ferritin 20 in October 2022.  PERTINENT  PMH / PSH: HTN, T2DM, Parkinsons' disease, iron deficiency anemia, diverticulosis  OBJECTIVE:   BP 106/67    Pulse 86    Temp 98.8 F (37.1 C) (Oral)    Ht 5\' 6"  (1.676 m)    Wt 163 lb 6.4 oz (74.1 kg)    SpO2 100%    BMI 26.37 kg/m   General: A&O, NAD HEENT: No sign of trauma, EOM grossly intact, conjunctiva pink without significant pallor Cardiac: RRR, no m/r/g, no JVD Respiratory: CTAB, normal WOB, no w/c/r, speaking in complete sentences while sitting without sensory respiratory muscle use GI: Soft, NTTP, non-distended  Extremities: NTTP, 1+ pitting edema of right lower extremity, with chronic venous changes, (chronic for her), no edema of left lower extremity Neuro: Normal gait, moves all four extremities appropriately. Psych: Appropriate mood and affect   ASSESSMENT/PLAN:    Dyspnea on exertion - Differential includes cardiac versus pulmonary versus related to anemia versus less likely electrolyte abnormality as this is mostly exertional -EKG done by myself in office and added to chart shows normal sinus rhythm without ST elevation or T wave inversion.  Rate 76 bpm, QTC 445 ms, no signs of ischemia - No echo in prior records, suspect as she came to me on Lasix that she may have a component of heart disease, have ordered an echo to assess this - We will also get chest x-ray, low suspicion for pneumonia based on no infectious signs or symptoms, no sputum production, no fevers - We will get lab work to assess for anemia although she reports no signs of bleeding and to check electrolytes as she has not been taking her potassium as she thought it was leading to her having to urinate more - Vitals are stable, but strict return precautions given in the AVS   Hep C screening check for healthcare maintenance. Follow-up in 1 to 2 weeks, will call with echo results.  [name], MD [place]

## 2021-09-27 ENCOUNTER — Encounter: Payer: Self-pay | Admitting: Podiatry

## 2021-09-27 ENCOUNTER — Ambulatory Visit (INDEPENDENT_AMBULATORY_CARE_PROVIDER_SITE_OTHER): Payer: Medicare Other | Admitting: Podiatry

## 2021-09-27 ENCOUNTER — Other Ambulatory Visit: Payer: Self-pay

## 2021-09-27 DIAGNOSIS — M79674 Pain in right toe(s): Secondary | ICD-10-CM

## 2021-09-27 DIAGNOSIS — M79675 Pain in left toe(s): Secondary | ICD-10-CM | POA: Diagnosis not present

## 2021-09-27 DIAGNOSIS — B351 Tinea unguium: Secondary | ICD-10-CM

## 2021-09-27 DIAGNOSIS — N289 Disorder of kidney and ureter, unspecified: Secondary | ICD-10-CM

## 2021-09-27 DIAGNOSIS — I872 Venous insufficiency (chronic) (peripheral): Secondary | ICD-10-CM

## 2021-09-27 DIAGNOSIS — E119 Type 2 diabetes mellitus without complications: Secondary | ICD-10-CM

## 2021-09-27 NOTE — Progress Notes (Addendum)
This patient returns to my office for at risk foot care.  This patient requires this care by a professional since this patient will be at risk due to having diabetes, chronic kidney disease.   This patient is unable to cut nails herself since the patient cannot reach her nails.These nails are painful walking and wearing shoes.  She presents to the office with female caregiver.This patient presents for at risk foot care today.  General Appearance  Alert, conversant and in no acute stress.  Vascular  Dorsalis pedis pulses are palpable  bilaterally. Posterior tibial pulses are absent. Capillary return is within normal limits  bilaterally. Cold feet bilaterally. Absent digital hair.  Neurologic  Senn-Weinstein monofilament wire test diminished  B/L.Marland Kitchen Muscle power within normal limits bilaterally.  Nails Thick disfigured discolored nails with subungual debris  from hallux to fifth toes bilaterally. No evidence of bacterial infection or drainage bilaterally.  Orthopedic  No limitations of motion  feet .  No crepitus or effusions noted.  No bony pathology or digital deformities noted. PTTD right foot.  DJD right reardoot medially.  Mild  HAV  B/L.  Skin  normotropic skin with no porokeratosis noted bilaterally.  No signs of infections or ulcers noted.     Onychomycosis  Pain in right toes  Pain in left toes  Consent was obtained for treatment procedures.   Mechanical debridement of nails 1-5  bilaterally performed with a nail nipper.  Filed with dremel without incident.    Return office visit  4 months                    Told patient to return for periodic foot care and evaluation due to potential at risk complications.   Gardiner Barefoot DPM

## 2021-09-28 ENCOUNTER — Encounter: Payer: Self-pay | Admitting: Family Medicine

## 2021-09-28 ENCOUNTER — Ambulatory Visit (INDEPENDENT_AMBULATORY_CARE_PROVIDER_SITE_OTHER): Payer: Medicare Other | Admitting: Family Medicine

## 2021-09-28 ENCOUNTER — Other Ambulatory Visit: Payer: Self-pay

## 2021-09-28 VITALS — BP 106/67 | HR 86 | Temp 98.8°F | Ht 66.0 in | Wt 163.4 lb

## 2021-09-28 DIAGNOSIS — R0609 Other forms of dyspnea: Secondary | ICD-10-CM | POA: Diagnosis not present

## 2021-09-28 DIAGNOSIS — Z1159 Encounter for screening for other viral diseases: Secondary | ICD-10-CM | POA: Insufficient documentation

## 2021-09-28 NOTE — Assessment & Plan Note (Signed)
-   Differential includes cardiac versus pulmonary versus related to anemia versus less likely electrolyte abnormality as this is mostly exertional -EKG done by myself in office and added to chart shows normal sinus rhythm without ST elevation or T wave inversion.  Rate 76 bpm, QTC 445 ms, no signs of ischemia - No echo in prior records, suspect as she came to me on Lasix that she may have a component of heart disease, have ordered an echo to assess this - We will also get chest x-ray, low suspicion for pneumonia based on no infectious signs or symptoms, no sputum production, no fevers - We will get lab work to assess for anemia although she reports no signs of bleeding and to check electrolytes as she has not been taking her potassium as she thought it was leading to her having to urinate more - Vitals are stable, but strict return precautions given in the AVS

## 2021-09-28 NOTE — Patient Instructions (Addendum)
It was wonderful to see you today.  Please bring ALL of your medications with you to every visit.   Today we talked about:  - Your EKG looked okay today, we will get some lab work - We are getting an ECHO later this week - If you have chest pain, worsening shortness of breath, worsening leg swelling, or feeling your heart race go to the ED immediately   Thank you for choosing [facility].   Please call [phone] with any questions about today's appointment.  Please be sure to schedule follow up at the front  desk before you leave today.   Yehuda Savannah, MD  Family Medicine

## 2021-09-29 LAB — BASIC METABOLIC PANEL
BUN/Creatinine Ratio: 17 (ref 12–28)
BUN: 14 mg/dL (ref 8–27)
CO2: 26 mmol/L (ref 20–29)
Calcium: 9.2 mg/dL (ref 8.7–10.3)
Chloride: 104 mmol/L (ref 96–106)
Creatinine, Ser: 0.81 mg/dL (ref 0.57–1.00)
Glucose: 79 mg/dL (ref 70–99)
Potassium: 4.5 mmol/L (ref 3.5–5.2)
Sodium: 142 mmol/L (ref 134–144)
eGFR: 79 mL/min/{1.73_m2} (ref >59)

## 2021-09-29 LAB — CBC
Hematocrit: 26.6 % — ABNORMAL LOW (ref 34.0–46.6)
Hemoglobin: 8.4 g/dL — ABNORMAL LOW (ref 11.1–15.9)
MCH: 23.4 pg — ABNORMAL LOW (ref 26.6–33.0)
MCHC: 31.6 g/dL (ref 31.5–35.7)
MCV: 74 fL — ABNORMAL LOW (ref 79–97)
Platelets: 384 10*3/uL (ref 150–450)
RBC: 3.59 x10E6/uL — ABNORMAL LOW (ref 3.77–5.28)
RDW: 15.5 % — ABNORMAL HIGH (ref 11.7–15.4)
WBC: 4.8 10*3/uL (ref 3.4–10.8)

## 2021-09-29 LAB — HCV INTERPRETATION

## 2021-09-29 LAB — HCV AB W REFLEX TO QUANT PCR: HCV Ab: <0.1 s/co ratio (ref 0.0–0.9)

## 2021-09-29 LAB — BRAIN NATRIURETIC PEPTIDE: BNP: 20.1 pg/mL (ref 0.0–100.0)

## 2021-09-29 LAB — MAGNESIUM: Magnesium: 2.2 mg/dL (ref 1.6–2.3)

## 2021-09-30 ENCOUNTER — Other Ambulatory Visit: Payer: Self-pay

## 2021-09-30 ENCOUNTER — Ambulatory Visit (HOSPITAL_COMMUNITY)
Admission: RE | Admit: 2021-09-30 | Discharge: 2021-09-30 | Disposition: A | Discharge status: Home / Self Care | Payer: Medicare Other | Source: Ambulatory Visit | Attending: Family Medicine | Admitting: Family Medicine

## 2021-09-30 DIAGNOSIS — I509 Heart failure, unspecified: Secondary | ICD-10-CM | POA: Diagnosis not present

## 2021-09-30 DIAGNOSIS — I3481 Nonrheumatic mitral (valve) annulus calcification: Secondary | ICD-10-CM | POA: Diagnosis not present

## 2021-09-30 DIAGNOSIS — R0609 Other forms of dyspnea: Secondary | ICD-10-CM | POA: Diagnosis present

## 2021-09-30 DIAGNOSIS — Z79899 Other long term (current) drug therapy: Secondary | ICD-10-CM | POA: Diagnosis not present

## 2021-09-30 LAB — ECHOCARDIOGRAM COMPLETE
Area-P 1/2: 6.83 cm2
S' Lateral: 2.4 cm

## 2021-10-13 ENCOUNTER — Ambulatory Visit: Payer: Medicare Other | Admitting: Family Medicine

## 2021-12-13 ENCOUNTER — Telehealth: Payer: Self-pay | Admitting: Physical Therapy

## 2021-12-13 DIAGNOSIS — G20 Parkinson's disease: Secondary | ICD-10-CM

## 2021-12-13 NOTE — Telephone Encounter (Signed)
Dr. Carles Collet, ? ?Brilyn Phon would benefit from an OT and PT evaluation for PD 6 month follow up evals.  ? ?If you agree, please place an order in [facility] workque in [facility] or fax the order to [phone]. ?Thank you, ?Janann August, PT, DPT ?12/13/21 4:42 PM  ? ? ?[address] ?[address] ?[address] ?[place], [place]  [address] ?Phone:  [phone] ?Fax:  [phone] ? ?

## 2021-12-20 ENCOUNTER — Ambulatory Visit: Payer: Medicare Other | Attending: Neurology | Admitting: Physical Therapy

## 2021-12-20 ENCOUNTER — Ambulatory Visit: Payer: Medicare Other | Admitting: Occupational Therapy

## 2021-12-20 ENCOUNTER — Encounter: Payer: Self-pay | Admitting: Occupational Therapy

## 2021-12-20 DIAGNOSIS — G20 Parkinson's disease: Secondary | ICD-10-CM | POA: Insufficient documentation

## 2021-12-20 DIAGNOSIS — M25612 Stiffness of left shoulder, not elsewhere classified: Secondary | ICD-10-CM | POA: Insufficient documentation

## 2021-12-20 DIAGNOSIS — R293 Abnormal posture: Secondary | ICD-10-CM

## 2021-12-20 DIAGNOSIS — R29898 Other symptoms and signs involving the musculoskeletal system: Secondary | ICD-10-CM | POA: Insufficient documentation

## 2021-12-20 DIAGNOSIS — Z9181 History of falling: Secondary | ICD-10-CM

## 2021-12-20 DIAGNOSIS — R29818 Other symptoms and signs involving the nervous system: Secondary | ICD-10-CM

## 2021-12-20 DIAGNOSIS — R2681 Unsteadiness on feet: Secondary | ICD-10-CM | POA: Diagnosis present

## 2021-12-20 DIAGNOSIS — G8929 Other chronic pain: Secondary | ICD-10-CM | POA: Insufficient documentation

## 2021-12-20 DIAGNOSIS — M25611 Stiffness of right shoulder, not elsewhere classified: Secondary | ICD-10-CM

## 2021-12-20 DIAGNOSIS — R278 Other lack of coordination: Secondary | ICD-10-CM

## 2021-12-20 DIAGNOSIS — M6281 Muscle weakness (generalized): Secondary | ICD-10-CM

## 2021-12-20 DIAGNOSIS — M25512 Pain in left shoulder: Secondary | ICD-10-CM | POA: Diagnosis present

## 2021-12-20 DIAGNOSIS — R2689 Other abnormalities of gait and mobility: Secondary | ICD-10-CM | POA: Diagnosis present

## 2021-12-20 NOTE — Therapy (Signed)
?OUTPATIENT PHYSICAL THERAPY NEURO EVALUATION ? ? ?Patient Name: Anna Bernard ?MRN: [id number] ?DOB:Feb 16, 1951, 71 y.o., female ?Today's Date: 12/20/2021 ? ?PCP: [name], MD ?REFERRING PROVIDER: Ludwig Clarks, DO ? ? PT End of Session - 12/20/21 1603   ? ? Visit Number 1   ? Number of Visits 17   ? Date for PT Re-Evaluation 03/20/22   ? Authorization Type UHC Medicare   ? PT Start Time 1408   pt running late from OT eval  ? PT Stop Time 1448   ? PT Time Calculation (min) 40 min   ? Equipment Utilized During Treatment Gait belt   ? Activity Tolerance Patient tolerated treatment well;Patient limited by fatigue   ? Behavior During Therapy WFL for tasks assessed/performed;Flat affect   ? ?  ?  ? ?  ? ? ?Past Medical History:  ?Diagnosis Date  ? Anemia, unspecified   ? Hyperlipidemia, unspecified   ? Hypertension   ? Hypothyroidism, unspecified   ? Pre-diabetes   ? Vitamin B12 deficiency   ? ?Past Surgical History:  ?Procedure Laterality Date  ? ABDOMINAL HYSTERECTOMY    ? GIVENS CAPSULE STUDY N/A 04/16/2018  ? Procedure: GIVENS CAPSULE STUDY;  Surgeon: Carol Ada, MD;  Location: [place];  Service: Endoscopy;  Laterality: N/A;  ? PARS PLANA VITRECTOMY Right 08/16/2020  ? Procedure: 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;  Surgeon: Jalene Mullet, MD;  Location: [place];  Service: Ophthalmology;  Laterality: Right;  ? PARS PLANA VITRECTOMY Right 09/29/2020  ? Procedure: PARS PLANA VITRECTOMY WITH 25 GAUGE - ENDOCAUTRY;  Surgeon: Jalene Mullet, MD;  Location: [place];  Service: Ophthalmology;  Laterality: Right;  ? PHOTOCOAGULATION WITH LASER Right 08/16/2020  ? Procedure: PHOTOCOAGULATION WITH LASER;  Surgeon: Jalene Mullet, MD;  Location: [place];  Service: Ophthalmology;  Laterality: Right;  ? PHOTOCOAGULATION WITH LASER Right 09/29/2020  ? Procedure: PHOTOCOAGULATION WITH LASER;  Surgeon: Jalene Mullet, MD;  Location: [place];  Service: Ophthalmology;  Laterality: Right;  ?  SHOULDER SURGERY    ? TONSILLECTOMY    ? TUBAL LIGATION    ? ?Patient Active Problem List  ? Diagnosis Date Noted  ? Dyspnea on exertion 09/28/2021  ? Need for hepatitis C screening test 09/28/2021  ? [place] 07/09/2021  ? Seborrheic keratoses 07/09/2021  ? Pain due to onychomycosis of toenails of both feet 06/17/2020  ? Parkinson's disease ([place]) 04/23/2020  ? Leg swelling 11/26/2019  ? Chronic venous insufficiency 11/26/2019  ? Vitamin B12 deficiency 02/06/2018  ? Sepsis secondary to UTI ([place]) 01/29/2018  ? Microcytic anemia 01/29/2018  ? Renal insufficiency 01/29/2018  ? Pure hypercholesterolemia 12/24/2014  ? Diabetes mellitus type 2, controlled, without complications ([place]) 123XX123  ? Bruit of right carotid artery 12/24/2014  ? Thyroid disease 07/06/2014  ? Severe obesity (BMI >= 40) ([place]) 07/06/2014  ? Essential hypertension 07/06/2014  ? ? ?ONSET DATE: 12/14/2021 (date of referral) ? ?REFERRING DIAG: G20 (ICD-10-CM) - Parkinson's disease ([place]) ? ?THERAPY DIAG:  ?Muscle weakness (generalized) ? ?Abnormal posture ? ?Other abnormalities of gait and mobility ? ?Unsteadiness on feet ? ?Other symptoms and signs involving the nervous system ? ?History of falling ? ?SUBJECTIVE:  ?                                                                                                                                                                                           ? ?  SUBJECTIVE STATEMENT: ?Pt reports more pain in the shoulders. Not walking as good as she was. Right foot is still hurting (has not recently seen the doctor about this). Not as mobile as she was. Reports Marden Noble has to push her around in the rollator sometimes. When she uses the rollator, legs freeze under her and the rollator goes forwards. Also have recently bought a transport w/c. Has not been doing any of her exercises at home. Feels as if she is getting weaker.  ? ?Pt accompanied by: significant other husband, Marden Noble ? ?PERTINENT HISTORY: Dx with  Parkinson's disease (Dr. Carles Collet) 04/2020; PMH of HTN, DM, hypothyroidism. ? ?Per Dr. 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 ?PAIN:  ?Are you having pain? Yes: NPRS scale: 5.5/10 ?Pain location: L shoulder ?Pain description: Aching ?Aggravating factors: laying on it wrong.  ?Relieving factors: when [name] rubs the pain down  ? ?PRECAUTIONS: Fall ? ?FALLS: Has patient fallen in last 6 months? Yes. Number of falls at least 3  Difficulty going from hard wood to carpet, has freezing episodes.  ? ?LIVING ENVIRONMENT: ?Lives with: lives with their family and lives with their spouse ?Lives in: House/apartment ?Stairs: Yes: External: 5 steps; can reach both ?Has following equipment at home: Gilford Rile - 4 wheeled, shower chair, Grab bars, and transport chair ? ?PLOF: Independent with household mobility with device and Independent with community mobility with device ? ?PATIENT GOALS Wants to get back to herself, stop depending so much on [place] ? ?OBJECTIVE:  ? ? ?COGNITION: ?Overall cognitive status: Within functional limits for tasks assessed ?  ?SENSATION: ?WFL ? ?COORDINATION: ?Heel to shin: incr difficulty with RLE  ? ?MUSCLE TONE: RLE: Rigidity ? ? ? ? ?POSTURE: rounded shoulders and forward head ? ? ? ?MMT:   ? ?MMT Right ?12/20/2021 Left ?12/20/2021  ?Hip flexion 4-/5 4-/5  ?Hip extension    ?Hip abduction    ?Hip adduction    ?Hip internal rotation    ?Hip external rotation    ?Knee flexion 4/5 4+/5  ?Knee extension 4+/5 4+/5  ?Ankle dorsiflexion 4/5 4+/5  ?Ankle plantarflexion    ?Ankle inversion    ?Ankle eversion    ?(Blank rows = not tested) ? ?BED MOBILITY:  ?Pt has an adjustable bed. Has trouble getting out of it. Also has a bed rail. Not able to assess during eval.  ? ?TRANSFERS: ?Assistive device utilized: with rollator locked in front of pt   ?Sit to stand: SBA ?Stand to sit: SBA ?Decr forward lean, more narrow BOS, and feet more anteriorly. Uncontrolled descent with sitting.  ? ?Needs cues for sit <> stands to push  up from chair vs. Rollator and to reach back when sitting down.  ? ? ?GAIT: ?Gait pattern: step to pattern, step through pattern, decreased step length- Right, decreased step length- Left, decreased stance time- Right, decreased stance time- Left, decreased stride length, decreased ankle dorsiflexion- Right, decreased ankle dorsiflexion- Left, Right foot flat, Left foot flat, shuffling, trunk flexed, poor foot clearance- Right, and poor foot clearance- Left ?Distance walked: 2 x 60'  ?Assistive device utilized: Environmental consultant - 4 wheeled Radiation protection practitioner. ?Level of assistance: SBA and CGA ?Comments: 2 episodes of freezing during assessment of gait speed. Needs cues to stop and reset and initiate with rocking. Pt with incr difficulty with turning. More difficulty with R foot clearance. Cues for incr step length and foot clearance while ambulating out of session. ? ?Pt needing a seated rest break after assessment of gait speed due to  fatigue. Adjusted pt's rollator to a more appropriate height (came in with it being way too tall).   ? ?FUNCTIONAL TESTs:  ?5 times sit to stand: 44.5 sec with BUE support ?Timed up and go (TUG): 44.2 sec with rollator  ?Gait speed: 50 sec = .65 ft/sec = household ambulator.  ? ? ?TODAY'S TREATMENT:  ?N/A during eval.  ? ? ?PATIENT EDUCATION: ?Education details: Clinical findings, results of outcome measures compared to when pt was here last year (pt with significantly slowed measures), POC, importance of exercise with PD.  ?Person educated: Patient and pt's spouse. ?Education method: Explanation ?Education comprehension: verbalized understanding ? ? ?HOME EXERCISE PROGRAM: ?Will review previous HEP from bout of therapy and update as appropriate.  ? ? ? ?GOALS: ?Goals reviewed with patient? Yes ? ?SHORT TERM GOALS: Target date: 01/17/2022 ? ? Pt will perform initial HEP with family supervision, for improved balance, stregnth, transfers, and gait.  ?Baseline: ?Goal status: INITIAL ? ?2.  Pt will undergo  further assessment of 2MWT with LTG updated. ?Baseline:  ?Goal status: INITIAL ? ?3.  Pt and pt's husband will verbalize and demonstrate understanding of techniques to assist w/ freezing episodes during gait

## 2021-12-20 NOTE — Therapy (Signed)
?OUTPATIENT OCCUPATIONAL THERAPY PARKINSON'S EVALUATION ? ?Patient Name: Anna Bernard ?MRN: [id number] ?DOB:09-09-1951, 71 y.o., female ?Today's Date: 12/20/2021 ? ?PCP: Billey Co, MD ?REFERRING PROVIDER: Vladimir Faster, DO  ? ? ? OT End of Session - 12/20/21 1412   ? ? Visit Number 1   ? Number of Visits 17   ? Date for OT Re-Evaluation 02/15/22   ? Authorization Type UHC Medicare   ? Progress Note Due on Visit 10   ? OT Start Time 1315   ? OT Stop Time 1405   ? OT Time Calculation (min) 50 min   ? Activity Tolerance Patient tolerated treatment well   ? Behavior During Therapy WFL for tasks assessed/performed;Flat affect   ? ?  ?  ? ?  ?  ? ? ?Past Medical History:  ?Diagnosis Date  ? Anemia, unspecified   ? Hyperlipidemia, unspecified   ? Hypertension   ? Hypothyroidism, unspecified   ? Pre-diabetes   ? Vitamin B12 deficiency   ? ?Past Surgical History:  ?Procedure Laterality Date  ? ABDOMINAL HYSTERECTOMY    ? GIVENS CAPSULE STUDY N/A 04/16/2018  ? Procedure: GIVENS CAPSULE STUDY;  Surgeon: Jeani Hawking, MD;  Location: [facility] ENDOSCOPY;  Service: Endoscopy;  Laterality: N/A;  ? PARS PLANA VITRECTOMY Right 08/16/2020  ? Procedure: 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;  Surgeon: Carmela Rima, MD;  Location: [facility] OR;  Service: Ophthalmology;  Laterality: Right;  ? PARS PLANA VITRECTOMY Right 09/29/2020  ? Procedure: PARS PLANA VITRECTOMY WITH 25 GAUGE - ENDOCAUTRY;  Surgeon: Carmela Rima, MD;  Location: [facility] OR;  Service: Ophthalmology;  Laterality: Right;  ? PHOTOCOAGULATION WITH LASER Right 08/16/2020  ? Procedure: PHOTOCOAGULATION WITH LASER;  Surgeon: Carmela Rima, MD;  Location: [facility] OR;  Service: Ophthalmology;  Laterality: Right;  ? PHOTOCOAGULATION WITH LASER Right 09/29/2020  ? Procedure: PHOTOCOAGULATION WITH LASER;  Surgeon: Carmela Rima, MD;  Location: [facility] OR;  Service: Ophthalmology;  Laterality: Right;  ? SHOULDER SURGERY    ? TONSILLECTOMY    ? TUBAL LIGATION     ? ?Patient Active Problem List  ? Diagnosis Date Noted  ? Dyspnea on exertion 09/28/2021  ? Need for hepatitis C screening test 09/28/2021  ? [place] 07/09/2021  ? Seborrheic keratoses 07/09/2021  ? Pain due to onychomycosis of toenails of both feet 06/17/2020  ? Parkinson's disease ([facility]) 04/23/2020  ? Leg swelling 11/26/2019  ? Chronic venous insufficiency 11/26/2019  ? Vitamin B12 deficiency 02/06/2018  ? Sepsis secondary to UTI ([facility]) 01/29/2018  ? Microcytic anemia 01/29/2018  ? Renal insufficiency 01/29/2018  ? Pure hypercholesterolemia 12/24/2014  ? Diabetes mellitus type 2, controlled, without complications ([facility]) 12/24/2014  ? Bruit of right carotid artery 12/24/2014  ? Thyroid disease 07/06/2014  ? Severe obesity (BMI >= 40) ([facility]) 07/06/2014  ? Essential hypertension 07/06/2014  ? ? ?ONSET DATE: 12/14/2021 referral date ? 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 ?THERAPY DIAG:  ?Other symptoms and signs involving the musculoskeletal system ? ?Other symptoms and signs involving the nervous system ? ?Other lack of coordination ? ?Abnormal posture ? ?Stiffness of right shoulder, not elsewhere classified ? ?Stiffness of left shoulder, not elsewhere classified ? ?Chronic left shoulder pain ? ?Unsteadiness on feet ? ?Muscle weakness (generalized) ? ?SUBJECTIVE:  ? ?SUBJECTIVE STATEMENT: ?My Lt shoulder bothers me now ? ?Pt accompanied by: significant other ? ?PERTINENT HISTORY: anemia, hyperlipidemia, orthostatic hypotension, HTN, hypothyroidism, Vitamin B12 deficiency, hx of  R RTC surgery 20+ years ago, leg swelling, arthritis (per pt), obesity. ? ?PRECAUTIONS: Fall ? ?WEIGHT BEARING RESTRICTIONS No ? ?PAIN:  ?Are you having pain? Yes: NPRS scale: 7/10 ?Pain location: Lt shoulder ?Pain description: achy ?Aggravating factors: movement, cold weather ?Relieving factors: topical creme, OTC meds ? ?FALLS: Has patient fallen in last 6 months? Yes. Number of  falls 3 ? ?LIVING ENVIRONMENT: ?Lives with: lives with their spouse ?Lives in: House/apartment ?Stairs: Yes: External: 7 steps; can reach both (Lives on first floor) ?Has following equipment at home: Wheelchair (manual), shower chair, bed side commode, and rollator ? ?PLOF: Needs assistance with ADLs for time efficiency ? ?PATIENT GOALS get better ? ?OBJECTIVE:  ? ?HAND DOMINANCE: Right ? ?ADLs: ?Transfers/ambulation related to ADLs: ?Eating: Ind, denies difficulty cutting food ?Grooming: dep for styling hair ?UB Dressing: slower, increased time, assist w/ jacket ?LB Dressing: mod assist ?Toileting: rolls to door and husband assist her getting to toilet, assist w/ clothes management sometimes (if time is issue) ?Bathing: Mod I mostly (seated), husband occasionally assist  ?Tub Shower transfers: walk in shower w/ threshhold, shower chair w/ back  ? ? ?IADLs: ?Shopping: husband does mostly, orders online and picks up a lot ?Light housekeeping: dep, daughter/granddaughter helps ?Meal Prep: mod assist for simple meal, or daughter/sister brings meals, or buys items already made ?Community mobility: relies on family ?Medication management: Independent ? ?Financial management: Independent ? ?Handwriting: 75% legible ? ?MOBILITY STATUS: Needs Assist: uses rollator (husband has lately been pushing her around in rollator d/t decline in mobility and rollator getting away from her), Freezing, and difficulty with turns ? ?POSTURE COMMENTS:  ?No Significant postural limitations and rounded shoulders ? ? ? ?FUNCTIONAL OUTCOME MEASURES: ?Fastening/unfastening 3 buttons: 32.69 sec ?Physical performance test: PPT#2 (simulated eating) 17 sec & PPT#4 (donning/doffing jacket): unable d/t bilateral sh limitations ? ?COORDINATION: No tremors ?9 Hole Peg test: Right: 54.32 sec; Left: 56.71 sec ?Box and Blocks:  Right 32 blocks, Left 30 blocks ? ?UE ROM:   bilateral shoulders limited to approx 80*, elbows distally WFL's  w/ cues for full  elbow extension ? ?UE MMT:   Not tested ? ?SENSATION: ?Light touch: WFL ? ? ? ?MUSCLE TONE: none detected ? ?COGNITION: ?Overall cognitive status: Within functional limits for tasks assessed ? ?VISION Pt has glasses, but needs new prescription. Pt reports needs eyes checked ? ?OBSERVATIONS: Bradykinesia, decreased mobility, no tremors noted ? ? ? ?ASSESSMENT: ? ?CLINICAL IMPRESSION: ?Patient is a 71 y.o. 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 Pt familiar to this clinic (since 2021 for O.T.) Pt presents today with bilateral shoulder stiffness and Lt shoulder pain, decreased mobility and decline in activity tolerance since last seen, decreased coordination, balance, bradykinesia, and reports of freezing. Pt would benefit from O.T. to address these deficits, issue PD specific HEP, and increase function, safety, and independence with ADLS.  ? ?PERFORMANCE DEFICITS in functional skills including ADLs, IADLs, coordination, dexterity, tone, ROM, strength, pain, flexibility, FMC, GMC, mobility, balance, body mechanics, endurance, decreased knowledge of use of DME, vision, and UE functional use,  ? ?IMPAIRMENTS are limiting patient from ADLs, IADLs, leisure, and social participation.  ? ?COMORBIDITIES may have co-morbidities  that affects occupational performance. Patient will benefit from skilled OT to address above impairments and improve overall function. ? ?MODIFICATION OR ASSISTANCE TO COMPLETE EVALUATION: Min-Moderate modification of tasks or assist with assess necessary to complete an evaluation. ? ?OT OCCUPATIONAL PROFILE AND HISTORY: Detailed  assessment: Review of records and additional review of physical, cognitive, psychosocial history related to current functional performance. ? ?CLINICAL DECISION MAKING: Moderate - several treatment options, min-mod task modification necessary ? ?REHAB POTENTIAL: Fair   ? ?EVALUATION COMPLEXITY:  Moderate ? ? ? ? ?GOALS: ?Goals reviewed with patient? Yes ? ?SHORT TERM GOALS: Target date: 01/17/2022 ? ?Independent with PD specific HEP  (including PWR! Supine, modified bag exercises, cane HEP, coordinat

## 2021-12-30 ENCOUNTER — Ambulatory Visit: Payer: Medicare Other | Admitting: Occupational Therapy

## 2021-12-30 ENCOUNTER — Ambulatory Visit: Payer: Medicare Other | Admitting: Physical Therapy

## 2022-01-03 ENCOUNTER — Other Ambulatory Visit: Payer: Self-pay

## 2022-01-03 ENCOUNTER — Encounter (HOSPITAL_BASED_OUTPATIENT_CLINIC_OR_DEPARTMENT_OTHER): Payer: Self-pay

## 2022-01-03 DIAGNOSIS — N3000 Acute cystitis without hematuria: Secondary | ICD-10-CM | POA: Insufficient documentation

## 2022-01-03 DIAGNOSIS — Z7982 Long term (current) use of aspirin: Secondary | ICD-10-CM | POA: Insufficient documentation

## 2022-01-03 DIAGNOSIS — W010XXA Fall on same level from slipping, tripping and stumbling without subsequent striking against object, initial encounter: Secondary | ICD-10-CM | POA: Insufficient documentation

## 2022-01-03 DIAGNOSIS — M25552 Pain in left hip: Secondary | ICD-10-CM | POA: Diagnosis not present

## 2022-01-03 DIAGNOSIS — R350 Frequency of micturition: Secondary | ICD-10-CM | POA: Diagnosis present

## 2022-01-03 DIAGNOSIS — Y92009 Unspecified place in unspecified non-institutional (private) residence as the place of occurrence of the external cause: Secondary | ICD-10-CM | POA: Insufficient documentation

## 2022-01-03 DIAGNOSIS — M545 Low back pain, unspecified: Secondary | ICD-10-CM | POA: Diagnosis not present

## 2022-01-03 DIAGNOSIS — G20 Parkinson's disease: Secondary | ICD-10-CM | POA: Insufficient documentation

## 2022-01-03 NOTE — ED Triage Notes (Signed)
Patient here POV from Home with Back Pain. ? ?Patient fell approximately 2-4 days. Patient states she was attempting to use the Bedside Commode at the [facility] when she slipped and fell. ? ?No Neck or Head Injury. Endorses No Pain initially but began to slowly have worsening Pain to Lower Back radiating down. ? ?NAD Noted during Triage. A&Ox4. GCS 15. BIB Wheelchair. Staff assisted originally in removing Patient from Vehicle.  ?

## 2022-01-04 ENCOUNTER — Other Ambulatory Visit: Payer: Self-pay | Admitting: Family Medicine

## 2022-01-04 ENCOUNTER — Emergency Department (HOSPITAL_BASED_OUTPATIENT_CLINIC_OR_DEPARTMENT_OTHER)
Admission: EM | Admit: 2022-01-04 | Discharge: 2022-01-04 | Disposition: A | Discharge status: Home / Self Care | Payer: Medicare Other | Attending: Emergency Medicine | Admitting: Emergency Medicine

## 2022-01-04 ENCOUNTER — Emergency Department (HOSPITAL_BASED_OUTPATIENT_CLINIC_OR_DEPARTMENT_OTHER): Payer: Medicare Other | Admitting: Radiology

## 2022-01-04 DIAGNOSIS — N3000 Acute cystitis without hematuria: Secondary | ICD-10-CM

## 2022-01-04 DIAGNOSIS — D509 Iron deficiency anemia, unspecified: Secondary | ICD-10-CM

## 2022-01-04 DIAGNOSIS — W19XXXA Unspecified fall, initial encounter: Secondary | ICD-10-CM

## 2022-01-04 DIAGNOSIS — M545 Low back pain, unspecified: Secondary | ICD-10-CM

## 2022-01-04 LAB — URINALYSIS, ROUTINE W REFLEX MICROSCOPIC
Bilirubin Urine: NEGATIVE
Glucose, UA: NEGATIVE mg/dL
Hgb urine dipstick: NEGATIVE
Ketones, ur: NEGATIVE mg/dL
Nitrite: POSITIVE — AB
Protein, ur: 30 mg/dL — AB
Specific Gravity, Urine: 1.017 (ref 1.005–1.030)
pH: 7 (ref 5.0–8.0)

## 2022-01-04 MED ORDER — NAPROXEN 500 MG PO TABS: 500.0000 mg | ORAL_TABLET | Freq: Two times a day (BID) | ORAL | 0 refills | Status: DC

## 2022-01-04 MED ORDER — NAPROXEN 250 MG PO TABS
500.0000 mg | ORAL_TABLET | Freq: Once | ORAL | Status: AC
  Administered 2022-01-04: 500 mg via ORAL
  Filled 2022-01-04: qty 2

## 2022-01-04 MED ORDER — CEPHALEXIN 500 MG PO CAPS: 500.0000 mg | ORAL_CAPSULE | Freq: Two times a day (BID) | ORAL | 0 refills | Status: AC

## 2022-01-04 MED ORDER — CEPHALEXIN 250 MG PO CAPS
500.0000 mg | ORAL_CAPSULE | Freq: Once | ORAL | Status: AC
  Administered 2022-01-04: 500 mg via ORAL
  Filled 2022-01-04: qty 2

## 2022-01-04 NOTE — ED Notes (Signed)
Pt verbalizes understanding of discharge instructions. Opportunity for questioning and answers were provided. Pt discharged from ED to home with family.    

## 2022-01-04 NOTE — ED Provider Notes (Signed)
? ?[facility] EMERGENCY DEPT  ?Provider Note ? ?CSN: [id number] ?Arrival date & time: 01/03/22 2046 ? ?History ?Chief Complaint  ?Patient presents with  ? Back Pain  ? ? ?Anna Bernard is a 71 y.o. female with history of parkinson's brought to the ED by family for evaluation after she had a fall trying to use bedside commode two days ago. Has been complaining of lower back and L hip pain since then. Husband also reports she has been complaining of urinary frequency but typically urinates very little. No fever, nausea or vomiting.  ? ? ?Home Medications ?Prior to Admission medications   ?Medication Sig Start Date End Date Taking? Authorizing Provider  ?cephALEXin (KEFLEX) 500 MG capsule Take 1 capsule (500 mg total) by mouth 2 (two) times daily for 7 days. 01/04/22 01/11/22 Yes Pollyann Savoy, MD  ?naproxen (NAPROSYN) 500 MG tablet Take 1 tablet (500 mg total) by mouth 2 (two) times daily. 01/04/22  Yes Pollyann Savoy, MD  ?aspirin 81 MG tablet Take 81 mg by mouth daily.    [provider]  ?Carbidopa-Levodopa ER (SINEMET CR) 25-100 MG tablet controlled release 3 at 7am, 2 at 11 am, 2 at 4pm 07/26/21   Tat, Rebecca S, DO  ?ferrous sulfate 325 (65 FE) MG tablet TAKE 1 TABLET BY MOUTH EVERY DAY WITH BREAKFAST 09/09/21   Billey Co, MD  ?furosemide (LASIX) 20 MG tablet TAKE 1 TABLET BY MOUTH TWICE A DAY ?Patient taking differently: Take 40 mg by mouth 2 (two) times daily. 03/14/20   Noni Saupe, MD  ?KLOR-CON M20 20 MEQ tablet TAKE 1 TABLET BY MOUTH TWICE A DAY ?Patient taking differently: Take 20 mEq by mouth daily. 03/14/20   Noni Saupe, MD  ?levothyroxine (SYNTHROID) 150 MCG tablet Take 1 tablet (150 mcg total) by mouth daily. 12/10/20   Just, Azalee Course, FNP  ?Multiple Vitamin (MULTIVITAMIN WITH MINERALS) TABS Take 1 tablet by mouth daily.    [provider]  ?olmesartan (BENICAR) 20 MG tablet Take 1 tablet (20 mg total) by mouth daily. ?Patient not taking:  Reported on 09/28/2021 12/10/20   Just, Azalee Course, FNP  ?polyethylene glycol powder (GLYCOLAX/MIRALAX) 17 GM/SCOOP powder Take 17 g by mouth 2 (two) times daily as needed for mild constipation or moderate constipation. 09/10/20   Just, Azalee Course, FNP  ?rosuvastatin (CRESTOR) 10 MG tablet Take 1 tablet (10 mg total) by mouth daily. 12/10/20   Just, Azalee Course, FNP  ? ? ? ?Allergies    ?Patient has no known allergies. ? ? ?Review of Systems   ?Review of Systems ?Please see HPI for pertinent positives and negatives ? ?Physical Exam ?BP (!) 162/79   Pulse 81   Temp 98 ?F (36.7 ?C) (Oral)   Resp 17   Ht 5\' 6"  (1.676 m)   Wt 74.1 kg   SpO2 96%   BMI 26.37 kg/m?  ? ?Physical Exam ?Vitals and nursing note reviewed.  ?Constitutional:   ?   Appearance: Normal appearance.  ?HENT:  ?   Head: Normocephalic and atraumatic.  ?   Nose: Nose normal.  ?   Mouth/Throat:  ?   Mouth: Mucous membranes are moist.  ?Eyes:  ?   Extraocular Movements: Extraocular movements intact.  ?   Conjunctiva/sclera: Conjunctivae normal.  ?Cardiovascular:  ?   Rate and Rhythm: Normal rate.  ?Pulmonary:  ?   Effort: Pulmonary effort is normal.  ?   Breath sounds: Normal breath sounds.  ?  Abdominal:  ?   General: Abdomen is flat.  ?   Palpations: Abdomen is soft.  ?   Tenderness: There is no abdominal tenderness.  ?Musculoskeletal:     ?   General: Tenderness (L lower back and L hip, mild) present. No swelling. Normal range of motion.  ?   Cervical back: Neck supple.  ?Skin: ?   General: Skin is warm and dry.  ?Neurological:  ?   General: No focal deficit present.  ?   Mental Status: She is alert.  ?Psychiatric:     ?   Mood and Affect: Mood normal.  ? ? ?ED Results / Procedures / Treatments   ?EKG ?None ? ?Procedures ?Procedures ? ?Medications Ordered in the ED ?Medications  ?cephALEXin (KEFLEX) capsule 500 mg (has no administration in time range)  ?naproxen (NAPROSYN) tablet 500 mg (has no administration in time range)  ? ? ?Initial Impression and Plan ?  Patient with a fall 2 days ago, here with low back pain/L hip pain. Will check xrays. Also having some urinary frequency, will check UA. Otherwise she is at her baseline.  ? ?ED Course  ? ?Clinical Course as of 01/04/22 0334  ?Tue Jan 04, 2022  ?0309 I personally viewed the images from radiology studies and agree with radiologist interpretation: Xrays neg for acute fracture. Able to ROM L leg without significant pain, doubt occult fx.  ? [CS]  ?0329 UA with signs of infection. Will begin Abx, otherwise she is well appearing, no signs of sepsis. Plan discharge home. Discussed results with husband who understands and is in agreement.  [CS]  ?  ?Clinical Course User Index ?[CS] Pollyann Savoy, MD  ? ? ? ?MDM Rules/Calculators/A&P ?Medical Decision Making ?Problems Addressed: ?Acute cystitis without hematuria: acute illness or injury ?Acute left-sided low back pain without sciatica: acute illness or injury ?Fall, initial encounter: acute illness or injury ? ?Amount and/or Complexity of Data Reviewed ?Labs: ordered. Decision-making details documented in ED Course. ?Radiology: ordered and independent interpretation performed. Decision-making details documented in ED Course. ? ?Risk ?Prescription drug management. ? ? ? ?Final Clinical Impression(s) / ED Diagnoses ?Final diagnoses:  ?Fall, initial encounter  ?Acute left-sided low back pain without sciatica  ?Acute cystitis without hematuria  ? ? ?Rx / DC Orders ?ED Discharge Orders   ? ?      Ordered  ?  naproxen (NAPROSYN) 500 MG tablet  2 times daily       ? 01/04/22 0334  ?  cephALEXin (KEFLEX) 500 MG capsule  2 times daily       ? 01/04/22 0334  ? ?  ?  ? ?  ? ?  ?Pollyann Savoy, MD ?01/04/22 [phone] ? ?

## 2022-01-06 ENCOUNTER — Ambulatory Visit: Payer: Medicare Other | Admitting: Physical Therapy

## 2022-01-06 ENCOUNTER — Ambulatory Visit: Payer: Medicare Other | Admitting: Occupational Therapy

## 2022-01-09 ENCOUNTER — Emergency Department (HOSPITAL_COMMUNITY): Payer: Medicare Other

## 2022-01-09 ENCOUNTER — Emergency Department (HOSPITAL_COMMUNITY)
Admission: EM | Admit: 2022-01-09 | Discharge: 2022-01-09 | Disposition: A | Discharge status: Home / Self Care | Payer: Medicare Other | Attending: Emergency Medicine | Admitting: Emergency Medicine

## 2022-01-09 ENCOUNTER — Encounter (HOSPITAL_COMMUNITY): Payer: Self-pay | Admitting: Emergency Medicine

## 2022-01-09 ENCOUNTER — Other Ambulatory Visit: Payer: Self-pay

## 2022-01-09 DIAGNOSIS — Z7982 Long term (current) use of aspirin: Secondary | ICD-10-CM | POA: Insufficient documentation

## 2022-01-09 DIAGNOSIS — M545 Low back pain, unspecified: Secondary | ICD-10-CM | POA: Diagnosis not present

## 2022-01-09 DIAGNOSIS — Z79899 Other long term (current) drug therapy: Secondary | ICD-10-CM | POA: Insufficient documentation

## 2022-01-09 DIAGNOSIS — G20 Parkinson's disease: Secondary | ICD-10-CM | POA: Insufficient documentation

## 2022-01-09 DIAGNOSIS — R52 Pain, unspecified: Secondary | ICD-10-CM

## 2022-01-09 LAB — URINALYSIS, ROUTINE W REFLEX MICROSCOPIC
Bilirubin Urine: NEGATIVE
Glucose, UA: NEGATIVE mg/dL
Hgb urine dipstick: NEGATIVE
Ketones, ur: NEGATIVE mg/dL
Leukocytes,Ua: NEGATIVE
Nitrite: NEGATIVE
Protein, ur: NEGATIVE mg/dL
Specific Gravity, Urine: 1.01 (ref 1.005–1.030)
pH: 7 (ref 5.0–8.0)

## 2022-01-09 MED ORDER — DEXAMETHASONE SODIUM PHOSPHATE 10 MG/ML IJ SOLN
10.0000 mg | Freq: Once | INTRAMUSCULAR | Status: AC
Start: 2022-01-09 — End: 2022-01-09
  Administered 2022-01-09: 10 mg via INTRAMUSCULAR
  Filled 2022-01-09: qty 1

## 2022-01-09 MED ORDER — PREDNISONE 20 MG PO TABS: 40.0000 mg | ORAL_TABLET | Freq: Every day | ORAL | 0 refills | Status: DC

## 2022-01-09 MED ORDER — KETOROLAC TROMETHAMINE 15 MG/ML IJ SOLN
15.0000 mg | Freq: Once | INTRAMUSCULAR | Status: AC
  Administered 2022-01-09: 15 mg via INTRAMUSCULAR
  Filled 2022-01-09: qty 1

## 2022-01-09 NOTE — ED Triage Notes (Signed)
Pt states she was seen recently for fall.  Pain medication is all gone and she is still in a lot of pain.  ?

## 2022-01-09 NOTE — ED Notes (Signed)
Patient transported to X-ray 

## 2022-01-09 NOTE — ED Notes (Signed)
E-signature pad unavailable at time of pt discharge. This RN discussed discharge materials with pt and answered all pt questions. Pt stated understanding of discharge material. ? ?

## 2022-01-09 NOTE — Discharge Instructions (Signed)
As discussed, your evaluation today has been largely reassuring.  But, it is important that you monitor your condition carefully, and do not hesitate to return to the ED if you develop new, or concerning changes in your condition. ? ?Otherwise, please follow-up with your physician for appropriate ongoing care. ? ?

## 2022-01-09 NOTE — ED Provider Notes (Signed)
?[facility] ?Provider Note ? ? ?CSN: [id number] ?Arrival date & time: 01/09/22  1659 ? ?  ? ?History ? ?Chief Complaint  ?Patient presents with  ? Body pain  ? ? ?Anna Bernard is a 71 y.o. female. ? ?HPI ?Patient presents with 2 family members who assist with history.  She had a mechanical fall 4 days ago was seen at our affiliated center, had x-rays, begin taking Naprosyn, notes that since she ran out of this medication overnight she has had worsening pain in her back.  At that point she was also diagnosed with urinary tract infection.  She denies dysuria, fever, abdominal pain, nausea, vomiting currently. ? ?  ? ?Home Medications ?Prior to Admission medications   ?Medication Sig Start Date End Date Taking? Authorizing Provider  ?aspirin 81 MG tablet Take 81 mg by mouth daily.    [provider]  ?Carbidopa-Levodopa ER (SINEMET CR) 25-100 MG tablet controlled release 3 at 7am, 2 at 11 am, 2 at 4pm 07/26/21   Tat, Wells Guiles S, DO  ?cephALEXin (KEFLEX) 500 MG capsule Take 1 capsule (500 mg total) by mouth 2 (two) times daily for 7 days. 01/04/22 01/11/22  Truddie Hidden, MD  ?ferrous sulfate 325 (65 FE) MG tablet TAKE 1 TABLET BY MOUTH EVERY DAY WITH BREAKFAST 01/04/22   [name], MD  ?furosemide (LASIX) 20 MG tablet TAKE 1 TABLET BY MOUTH TWICE A DAY ?Patient taking differently: Take 40 mg by mouth 2 (two) times daily. 03/14/20   Daleen Squibb, MD  ?KLOR-CON M20 20 MEQ tablet TAKE 1 TABLET BY MOUTH TWICE A DAY ?Patient taking differently: Take 20 mEq by mouth daily. 03/14/20   Daleen Squibb, MD  ?levothyroxine (SYNTHROID) 150 MCG tablet Take 1 tablet (150 mcg total) by mouth daily. 12/10/20   Just, Laurita Quint, FNP  ?Multiple Vitamin (MULTIVITAMIN WITH MINERALS) TABS Take 1 tablet by mouth daily.    [provider]  ?naproxen (NAPROSYN) 500 MG tablet Take 1 tablet (500 mg total) by mouth 2 (two) times daily. 01/04/22   Truddie Hidden, MD   ?olmesartan (BENICAR) 20 MG tablet Take 1 tablet (20 mg total) by mouth daily. ?Patient not taking: Reported on 09/28/2021 12/10/20   Just, Laurita Quint, FNP  ?polyethylene glycol powder (GLYCOLAX/MIRALAX) 17 GM/SCOOP powder Take 17 g by mouth 2 (two) times daily as needed for mild constipation or moderate constipation. 09/10/20   Just, Laurita Quint, FNP  ?rosuvastatin (CRESTOR) 10 MG tablet Take 1 tablet (10 mg total) by mouth daily. 12/10/20   Just, Laurita Quint, FNP  ?   ? ?Allergies    ?Patient has no known allergies.   ? ?Review of Systems   ?Review of Systems  ?Constitutional:   ?     Per HPI, otherwise negative  ?HENT:    ?     Per HPI, otherwise negative  ?Respiratory:    ?     Per HPI, otherwise negative  ?Cardiovascular:   ?     Per HPI, otherwise negative  ?Gastrointestinal:  Negative for vomiting.  ?Endocrine:  ?     Negative aside from HPI  ?Genitourinary:   ?     Neg aside from HPI   ?Musculoskeletal:   ?     Per HPI, otherwise negative  ?Skin: Negative.   ?Neurological:  Positive for weakness. Negative for syncope.  ?     Parkinson's disease  ? ?Physical Exam ?Updated Vital  Signs ?BP (!) 161/88 (BP Location: Left Arm)   Pulse 88   Temp 98.2 ?F (36.8 ?C) (Oral)   Resp 15   SpO2 100%  ?Physical Exam ?Vitals and nursing note reviewed.  ?Constitutional:   ?   General: She is not in acute distress. ?   Appearance: She is well-developed. She is ill-appearing. She is not toxic-appearing or diaphoretic.  ?HENT:  ?   Head: Normocephalic and atraumatic.  ?Eyes:  ?   Conjunctiva/sclera: Conjunctivae normal.  ?Cardiovascular:  ?   Rate and Rhythm: Normal rate and regular rhythm.  ?Pulmonary:  ?   Effort: Pulmonary effort is normal. No respiratory distress.  ?   Breath sounds: Normal breath sounds. No stridor.  ?Abdominal:  ?   General: There is no distension.  ?Musculoskeletal:  ?   Comments: Pelvis is stable, patient flexes G each hip independently, without apparent discomfort, no deformity in the lower extremities.   Patient notes that her pain is in the mid lumbar region.  ?Skin: ?   General: Skin is warm and dry.  ?Neurological:  ?   Mental Status: She is alert and oriented to person, place, and time.  ?   Cranial Nerves: No cranial nerve deficit.  ?   Motor: Tremor and atrophy present.  ?Psychiatric:     ?   Mood and Affect: Mood normal.  ? ? ?ED Results / Procedures / Treatments   ?Labs ?(all labs ordered are listed, but only abnormal results are displayed) ?Labs Reviewed  ?URINALYSIS, ROUTINE W REFLEX MICROSCOPIC  ? ? ?EKG ?None ? ?Radiology ?DG Thoracic Spine 2 View ? ?Result Date: 01/09/2022 ?CLINICAL DATA:  Persistent pain post fall. EXAM: THORACIC SPINE 2 VIEWS COMPARISON:  January 04, 2022 FINDINGS: No definite evidence of fracture. Exaggerated kyphosis. Flowing osteophytes of the thoracic spine, usually associated with inflammatory arthropathy. Calcific atherosclerotic disease of the aorta. IMPRESSION: 1. No definite evidence of fracture. 2. Exaggerated kyphosis. 3. Calcific atherosclerotic disease of the aorta. Electronically Signed   By: Fidela Salisbury M.D.   On: 01/09/2022 19:29  ? ?DG Lumbar Spine Complete ? ?Result Date: 01/09/2022 ?CLINICAL DATA:  Pain post fall. EXAM: LUMBAR SPINE - COMPLETE 4+ VIEW COMPARISON:  January 04, 2022 FINDINGS: No evidence of fracture. Grade 1 anterolisthesis at L4-L5. Bilateral flowing osteophytes. Moderate posterior facet arthropathy in the lower lumbosacral spine. Aortic calcifications noted. IMPRESSION: 1. No evidence of fracture. 2. Grade 1 anterolisthesis at L4-L5. 3. Electronically Signed   By: Fidela Salisbury M.D.   On: 01/09/2022 19:28   ? ?Procedures ?Procedures  ? ? ?Medications Ordered in ED ?Medications  ?ketorolac (TORADOL) 15 MG/ML injection 15 mg (15 mg Intramuscular Given 01/09/22 1926)  ?dexamethasone (DECADRON) injection 10 mg (10 mg Intramuscular Given 01/09/22 1925)  ? ? ?ED Course/ Medical Decision Making/ A&P ?This patient with a Hx of Parkinson disease, fall  presents to the ED for concern of pain in her back, this involves an extensive number of treatment options, and is a complaint that carries with it a high risk of complications and morbidity.   ? ?The differential diagnosis includes musculoskeletal injury following her fall, less likely pyelo- ? ? ?Social Determinants of Health: ? ?Age, Parkinson's disease ? ?Additional history obtained: ? ?Additional history and/or information obtained from family, chart review, notable for Raquel Sarna for HPI, chart review for x-rays of her hips without notable findings earlier in the week ? ? ?After the initial evaluation, orders, including: X-ray, meds were initiated. ? ? ?Patient  placed on Cardiac and Pulse-Oximetry Monitors. ?The patient was maintained on a cardiac monitor.  The cardiac monitored showed an rhythm of 90 sinus normal ?The patient was also maintained on pulse oximetry. The readings were typically 100% room air normal ? ? ?On repeat evaluation of the patient improved ? ?Lab Tests: ? ?I personally interpreted labs.  The pertinent results include: No urinary tract infection ? ?Imaging Studies ordered: ? ?I independently visualized and interpreted imaging which showed no fracture ?I agree with the radiologist interpretation ? ? ?Dispostion / Final MDM: ? ?After consideration of the diagnostic results and the patient's response to treatment, 10:07 PM ?Patient markedly improved, calm, no ongoing complaints.  In reviewing her findings, discussing with her, absent evidence of any fracture, with improvement following initiation of steroids, no evidence for urinary tract infection, bacteremia, sepsis he is discharged to follow-up with primary care. ? ?Final Clinical Impression(s) / ED Diagnoses ?Final diagnoses:  ?Pain  ? ? ?Rx / DC Orders ?ED Discharge Orders   ? ?      Ordered  ?  predniSONE (DELTASONE) 20 MG tablet  Daily with breakfast       ? 01/09/22 2208  ? ?  ?  ? ?  ? ? ?  ?Carmin Muskrat, MD ?01/09/22 2208 ? ?

## 2022-01-10 ENCOUNTER — Emergency Department (HOSPITAL_COMMUNITY)
Admission: EM | Admit: 2022-01-10 | Discharge: 2022-01-11 | Disposition: A | Discharge status: Home / Self Care | Payer: Medicare Other | Attending: Emergency Medicine | Admitting: Emergency Medicine

## 2022-01-10 ENCOUNTER — Encounter (HOSPITAL_COMMUNITY): Payer: Self-pay

## 2022-01-10 ENCOUNTER — Other Ambulatory Visit: Payer: Self-pay

## 2022-01-10 DIAGNOSIS — Z7982 Long term (current) use of aspirin: Secondary | ICD-10-CM | POA: Diagnosis not present

## 2022-01-10 DIAGNOSIS — E039 Hypothyroidism, unspecified: Secondary | ICD-10-CM | POA: Diagnosis not present

## 2022-01-10 DIAGNOSIS — R1084 Generalized abdominal pain: Secondary | ICD-10-CM | POA: Insufficient documentation

## 2022-01-10 DIAGNOSIS — Z723 Lack of physical exercise: Secondary | ICD-10-CM | POA: Diagnosis not present

## 2022-01-10 DIAGNOSIS — G20 Parkinson's disease: Secondary | ICD-10-CM | POA: Diagnosis not present

## 2022-01-10 DIAGNOSIS — M545 Low back pain, unspecified: Secondary | ICD-10-CM | POA: Diagnosis present

## 2022-01-10 DIAGNOSIS — R5381 Other malaise: Secondary | ICD-10-CM

## 2022-01-10 DIAGNOSIS — M791 Myalgia, unspecified site: Secondary | ICD-10-CM | POA: Insufficient documentation

## 2022-01-10 DIAGNOSIS — I10 Essential (primary) hypertension: Secondary | ICD-10-CM | POA: Insufficient documentation

## 2022-01-10 DIAGNOSIS — Z79899 Other long term (current) drug therapy: Secondary | ICD-10-CM | POA: Diagnosis not present

## 2022-01-10 LAB — CBC WITH DIFFERENTIAL/PLATELET
Abs Immature Granulocytes: 0.03 10*3/uL (ref 0.00–0.07)
Basophils Absolute: 0 10*3/uL (ref 0.0–0.1)
Basophils Relative: 0 %
Eosinophils Absolute: 0 10*3/uL (ref 0.0–0.5)
Eosinophils Relative: 0 %
HCT: 29.7 % — ABNORMAL LOW (ref 36.0–46.0)
Hemoglobin: 10 g/dL — ABNORMAL LOW (ref 12.0–15.0)
Immature Granulocytes: 0 %
Lymphocytes Relative: 9 %
Lymphs Abs: 0.6 10*3/uL — ABNORMAL LOW (ref 0.7–4.0)
MCH: 25.3 pg — ABNORMAL LOW (ref 26.0–34.0)
MCHC: 33.7 g/dL (ref 30.0–36.0)
MCV: 75 fL — ABNORMAL LOW (ref 80.0–100.0)
Monocytes Absolute: 0.1 10*3/uL (ref 0.1–1.0)
Monocytes Relative: 1 %
Neutro Abs: 6.6 10*3/uL (ref 1.7–7.7)
Neutrophils Relative %: 90 %
Platelets: 329 10*3/uL (ref 150–400)
RBC: 3.96 MIL/uL (ref 3.87–5.11)
RDW: 14.9 % (ref 11.5–15.5)
WBC: 7.3 10*3/uL (ref 4.0–10.5)
nRBC: 0 % (ref 0.0–0.2)

## 2022-01-10 LAB — COMPREHENSIVE METABOLIC PANEL
ALT: 5 U/L (ref 0–44)
AST: 15 U/L (ref 15–41)
Albumin: 3.7 g/dL (ref 3.5–5.0)
Alkaline Phosphatase: 52 U/L (ref 38–126)
Anion gap: 7 (ref 5–15)
BUN: 28 mg/dL — ABNORMAL HIGH (ref 8–23)
CO2: 27 mmol/L (ref 22–32)
Calcium: 9.2 mg/dL (ref 8.9–10.3)
Chloride: 103 mmol/L (ref 98–111)
Creatinine, Ser: 0.93 mg/dL (ref 0.44–1.00)
GFR, Estimated: >60 mL/min (ref >60)
Glucose, Bld: 150 mg/dL — ABNORMAL HIGH (ref 70–99)
Potassium: 4.1 mmol/L (ref 3.5–5.1)
Sodium: 137 mmol/L (ref 135–145)
Total Bilirubin: 0.7 mg/dL (ref 0.3–1.2)
Total Protein: 7.3 g/dL (ref 6.5–8.1)

## 2022-01-10 LAB — LIPASE, BLOOD: Lipase: 42 U/L (ref 11–51)

## 2022-01-10 NOTE — ED Triage Notes (Signed)
Pt was seen recently for abdominal pain. Was discharged with medicine and took it once. Pt states\ pain is worse. Denies N/V/D. ?

## 2022-01-10 NOTE — Progress Notes (Signed)
? ? ?SUBJECTIVE:  ? ?CHIEF COMPLAINT / HPI:  ? ?Patient presents in follow-up of recent ED visit on 01/04/2022 for fall. She reports experiencing unsteadiness when getting up to use the bathroom, resulting in a fall; she denies dizziness at this time. She had one fall prior to this (date unknown), for which she did not present to the ED. At the ED on 4/18, she had imaging done of her lumbar spine and hip which were negative for fracture. She was given naproxen 500 mg BID for pain control. She denies chest pain, dyspnea. ? ?She continued to experience pain in her lower abdominal and lower back region, and was seen at the ED again for this on 4/23 and 01/11/2022. Additional imaging continued to show no sign of fracture. She was prescribed prednisone at her 4/23 ED visit and oxycodone-acetaminophen at her 4/25 ED visit for pain management. She did have imaging of her lumbar spine done on 01/09/2022 which showed Grade 1 anterolisthesis at L4-L5; she'd previously had 5 cm of slippage, which had progressed to 8 cm on this imaging study.  ? ?At her 01/14/2022 OV, patient continues to report pain in her lower abdominal and lumbar regions. She reports pain is worst at night. Her husband reports an occasional "bump on [her] hip" and that her buttocks are occasionally not aligned. She does report her pain is better managed on oxycodone than it was on naproxen. She denies fever, chills, rash in her lumbar spine region, tingling or numbness in her legs, loss of motor control in her legs, numbness or loss of feeling in her genital region, incontinence. ? ?During her 01/04/2022 ED visit, patient reported burning sensation with urination and was positive for UTI based on urinalysis. She was treated with a 7 day course of Keflex. She then had a repeat urinalysis on 01/09/2022 which no longer showed bacteria. At her 01/14/2022 OV, she denies any burning or pain with urination.  ? ?Patient's BP is elevated at her 01/14/2022 OV at 166/88 on  our office cuff and 173/85 on patient's home cuff; BP remained elevated at repeat check. She reports varying home readings, ranging from the 110s-170s systolic. She denies headaches, vision changes, chest pain, dyspnea. ? ?PERTINENT  PMH / PSH: Parkinsons disease, T2DM, HTN ? ?OBJECTIVE:  ? ?BP (!) 166/88   Pulse 89   Ht 5\' 1"  (1.549 m)   Wt 158 lb 3.2 oz (71.8 kg)   SpO2 100%   BMI 29.89 kg/m?   ?General: A&O, NAD ?HEENT: No sign of trauma, EOM grossly intact ?Respiratory: normal WOB ?GI: Soft, NTTP, non-distended  ?Extremities: NTTP, no peripheral edema. ?Neuro: Patient presents in a wheelchair, moves all four extremities appropriately. Strength 5/5 in bilateral lower extremities, patellar DTRs 1+ bilaterally, sensation intact and symmetric lower extremities ?Psych: Appropriate mood and affect ? ? ?ASSESSMENT/PLAN:  ? ?Essential hypertension ?- elevated today, titrated with home cuff, unsure of home medications, will call and let me know what she is taking and we will adjust, although she sometimes runs low at home, and I suspect pain and taking naproxen could be contributing ? ?Chronic bilateral low back pain without sciatica ?- normal neurologic exam, no red flag signs or symptoms, with shared decision making discussed that she will continue PT, stop naproxen but use Voltaren gel, use tylenol for pain, the few oxycodone she has left for breakthrough ?- will refer to orthopedics for consideration of MRI vs injections for pain control ?- discussed ED/return precautions ?- f/u in 2  weeks ?  ?UTI symptoms resolved, no further work up indicated. ? ?[name], MD ?[facility]  ? ?

## 2022-01-10 NOTE — ED Provider Triage Note (Signed)
Emergency Medicine Provider Triage Evaluation Note ? ?Anna Bernard , a 71 y.o. female  was evaluated in triage.  Pt complains of abdominal and back pain.  Pain has been present over the last few days and getting progressively worse.  Patient did suffer a fall on 4/18 was evaluated in the emergency department.  Patient was evaluated in the emergency department yesterday as well.  Was started on prednisone.  Patient has taken prednisone months with no improvement in her symptoms.  Pain is worse with ambulation.  Patient Dors constipation.  No bowel movement in 5 days. ? ?Denies any fever, chills, nausea, vomiting, diarrhea, dysuria, hematuria, urinary urgency, vaginal pain, vaginal bleeding, vaginal discharge. ? ?Review of Systems  ?Positive: Abdominal pain, back pain ?Negative: See above ? ?Physical Exam  ?BP 127/76 (BP Location: Right Arm)   Pulse 84   Temp 98.5 ?F (36.9 ?C) (Oral)   Resp 20   Ht 5\' 1"  (1.549 m)   Wt 74.8 kg   SpO2 98%   BMI 31.18 kg/m?  ?Gen:   Awake, no distress   ?Resp:  Normal effort  ?MSK:   Moves extremities without difficulty  ?Other:  Abdomen soft, nondistended, nontender no guarding or rebound tenderness. ? ?Medical Decision Making  ?Medically screening exam initiated at 8:12 PM.  Appropriate orders placed.  was informed that the remainder of the evaluation will be completed by another provider, this initial triage assessment does not replace that evaluation, and the importance of remaining in the ED until their evaluation is complete. ? ? ?  ?Anna Fireman, PA-C ?01/10/22 2013 ? ?

## 2022-01-11 ENCOUNTER — Emergency Department (HOSPITAL_COMMUNITY): Payer: Medicare Other

## 2022-01-11 LAB — URINALYSIS, ROUTINE W REFLEX MICROSCOPIC
Bacteria, UA: NONE SEEN
Bilirubin Urine: NEGATIVE
Glucose, UA: NEGATIVE mg/dL
Hgb urine dipstick: NEGATIVE
Ketones, ur: NEGATIVE mg/dL
Nitrite: NEGATIVE
Protein, ur: NEGATIVE mg/dL
Specific Gravity, Urine: 1.027 (ref 1.005–1.030)
pH: 6 (ref 5.0–8.0)

## 2022-01-11 MED ORDER — OXYCODONE-ACETAMINOPHEN 5-325 MG PO TABS: 1.0000 | ORAL_TABLET | Freq: Four times a day (QID) | ORAL | 0 refills | Status: DC | PRN

## 2022-01-11 MED ORDER — OXYCODONE-ACETAMINOPHEN 5-325 MG PO TABS
1.0000 | ORAL_TABLET | Freq: Once | ORAL | Status: AC
  Administered 2022-01-11: 1 via ORAL
  Filled 2022-01-11: qty 1

## 2022-01-11 MED ORDER — IOHEXOL 300 MG/ML  SOLN
80.0000 mL | Freq: Once | INTRAMUSCULAR | Status: AC | PRN
  Administered 2022-01-11: 80 mL via INTRAVENOUS

## 2022-01-11 NOTE — ED Notes (Signed)
Patient verbalizes understanding of d/c instructions. Opportunities for questions and answers were provided. Pt d/c from ED and wheeled to lobby with husband. 

## 2022-01-11 NOTE — ED Provider Notes (Signed)
?[facility] ?Provider Note ? ? ?CSN: [id number] ?Arrival date & time: 01/10/22  1819 ? ?  ? ?History ? ?Chief Complaint  ?Patient presents with  ? Abdominal Pain  ? ? ?Anna FiremanMary A Bernard is a 71 y.o. female. ? ?HPI ? ?  ? ?This is a 71 year old female who presents with body pain.  Patient reports ongoing abdominal and back pain.  She relates this to her Parkinson's diagnosis over the last 2 years that has progressively gotten worse.  She did fall on 4/18 and was evaluated in the emergency department.  At that time had plain films which were reassuring.  She was seen and evaluated on 4/23 for worsening pain.  She has trialed naproxen and prednisone with minimal help.  Family reports that she has progressive difficulty with her ADLs and ambulation at home.  She normally uses a walker.  Patient denies weakness.  She reports lower abdominal pain that wraps around the back.  Denies urinary symptoms although recently diagnosed with a UTI. ? ?Imaging reviewed from 4/18 and 4/23.  Positive UTI on 4/18 which was treated and patient had a negative urine 2 days ago. ? ?Home Medications ?Prior to Admission medications   ?Medication Sig Start Date End Date Taking? Authorizing Provider  ?oxyCODONE-acetaminophen (PERCOCET/ROXICET) 5-325 MG tablet Take 1 tablet by mouth every 6 (six) hours as needed for severe pain. 01/11/22  Yes Cabria Micalizzi, Mayer Maskerourtney F, MD  ?aspirin 81 MG tablet Take 81 mg by mouth daily.    [provider]  ?Carbidopa-Levodopa ER (SINEMET CR) 25-100 MG tablet controlled release 3 at 7am, 2 at 11 am, 2 at 4pm 07/26/21   Tat, Rebecca S, DO  ?ferrous sulfate 325 (65 FE) MG tablet TAKE 1 TABLET BY MOUTH EVERY DAY WITH BREAKFAST 01/04/22   Billey CoPray, Margaret E, MD  ?furosemide (LASIX) 20 MG tablet TAKE 1 TABLET BY MOUTH TWICE A DAY ?Patient taking differently: Take 40 mg by mouth 2 (two) times daily. 03/14/20   Noni SaupeSantiago Lago, Irma M, MD  ?KLOR-CON M20 20 MEQ tablet TAKE 1 TABLET BY MOUTH  TWICE A DAY ?Patient taking differently: Take 20 mEq by mouth daily. 03/14/20   Noni SaupeSantiago Lago, Irma M, MD  ?levothyroxine (SYNTHROID) 150 MCG tablet Take 1 tablet (150 mcg total) by mouth daily. 12/10/20   Just, Azalee CourseKelsea J, FNP  ?Multiple Vitamin (MULTIVITAMIN WITH MINERALS) TABS Take 1 tablet by mouth daily.    [provider]  ?naproxen (NAPROSYN) 500 MG tablet Take 1 tablet (500 mg total) by mouth 2 (two) times daily. 01/04/22   Pollyann SavoySheldon, Charles B, MD  ?olmesartan (BENICAR) 20 MG tablet Take 1 tablet (20 mg total) by mouth daily. ?Patient not taking: Reported on 09/28/2021 12/10/20   Just, Azalee CourseKelsea J, FNP  ?polyethylene glycol powder (GLYCOLAX/MIRALAX) 17 GM/SCOOP powder Take 17 g by mouth 2 (two) times daily as needed for mild constipation or moderate constipation. 09/10/20   Just, Azalee CourseKelsea J, FNP  ?predniSONE (DELTASONE) 20 MG tablet Take 2 tablets (40 mg total) by mouth daily with breakfast. For the next four days 01/09/22   Gerhard MunchLockwood, Robert, MD  ?rosuvastatin (CRESTOR) 10 MG tablet Take 1 tablet (10 mg total) by mouth daily. 12/10/20   Just, Azalee CourseKelsea J, FNP  ?   ? ?Allergies    ?Patient has no known allergies.   ? ?Review of Systems   ?Review of Systems  ?Constitutional:  Negative for fever.  ?Respiratory:  Negative for cough, chest tightness and shortness of breath.   ?  Cardiovascular:  Negative for chest pain.  ?Gastrointestinal:  Positive for abdominal pain. Negative for nausea and vomiting.  ?Genitourinary:  Negative for dysuria.  ?Musculoskeletal:  Positive for back pain.  ?Skin:  Negative for wound.  ?Neurological:  Negative for weakness.  ?All other systems reviewed and are negative. ? ?Physical Exam ?Updated Vital Signs ?BP (!) 156/83   Pulse 95   Temp 98.4 ?F (36.9 ?C)   Resp 13   Ht 1.549 m (5\' 1" )   Wt 74.8 kg   SpO2 99%   BMI 31.18 kg/m?  ?Physical Exam ?Vitals and nursing note reviewed.  ?Constitutional:   ?   Appearance: She is well-developed. She is obese. She is not ill-appearing.  ?HENT:  ?    Head: Normocephalic and atraumatic.  ?Eyes:  ?   Pupils: Pupils are equal, round, and reactive to light.  ?Cardiovascular:  ?   Rate and Rhythm: Normal rate and regular rhythm.  ?   Heart sounds: Normal heart sounds.  ?Pulmonary:  ?   Effort: Pulmonary effort is normal. No respiratory distress.  ?   Breath sounds: No wheezing.  ?Abdominal:  ?   General: Bowel sounds are normal.  ?   Palpations: Abdomen is soft.  ?   Tenderness: There is no abdominal tenderness.  ?Musculoskeletal:  ?   Cervical back: Neck supple.  ?Skin: ?   General: Skin is warm and dry.  ?Neurological:  ?   Mental Status: She is alert and oriented to person, place, and time.  ?   Comments: 5 out of 5 strength bilateral lower extremities, slow movement  ?Psychiatric:  ?   Comments: Flat affect  ? ? ?ED Results / Procedures / Treatments   ?Labs ?(all labs ordered are listed, but only abnormal results are displayed) ?Labs Reviewed  ?COMPREHENSIVE METABOLIC PANEL - Abnormal; Notable for the following components:  ?    Result Value  ? Glucose, Bld 150 (*)   ? BUN 28 (*)   ? All other components within normal limits  ?CBC WITH DIFFERENTIAL/PLATELET - Abnormal; Notable for the following components:  ? Hemoglobin 10.0 (*)   ? HCT 29.7 (*)   ? MCV 75.0 (*)   ? MCH 25.3 (*)   ? Lymphs Abs 0.6 (*)   ? All other components within normal limits  ?URINALYSIS, ROUTINE W REFLEX MICROSCOPIC - Abnormal; Notable for the following components:  ? APPearance HAZY (*)   ? Leukocytes,Ua MODERATE (*)   ? All other components within normal limits  ?LIPASE, BLOOD  ? ? ?EKG ?None ? ?Radiology ?CT ABDOMEN PELVIS W CONTRAST ? ?Result Date: 01/11/2022 ?CLINICAL DATA:  Abdominal pain EXAM: CT ABDOMEN AND PELVIS WITH CONTRAST TECHNIQUE: Multidetector CT imaging of the abdomen and pelvis was performed using the standard protocol following bolus administration of intravenous contrast. RADIATION DOSE REDUCTION: This exam was performed according to the departmental dose-optimization  program which includes automated exposure control, adjustment of the mA and/or kV according to patient size and/or use of iterative reconstruction technique. CONTRAST:  21mL OMNIPAQUE IOHEXOL 300 MG/ML  SOLN COMPARISON:  09/19/2018 CT FINDINGS: Lower chest: No pleural effusion or pericardial effusion. No focal pulmonary opacity. Hepatobiliary: No focal liver abnormality is seen. Minimal sludge within the gallbladder but no gallstones, gallbladder wall thickening, or biliary dilatation. Pancreas: Unremarkable. No pancreatic ductal dilatation or surrounding inflammatory changes. Spleen: Normal in size without focal abnormality. Adrenals/Urinary Tract: The adrenal glands are unremarkable. The kidneys enhance symmetrically with no hydronephrosis or nephrolithiasis. Small  hypodense lesions in the kidneys, too small to characterize but likely renal cysts. Bladder is unremarkable. Stomach/Bowel: Small hiatal hernia. Stomach is otherwise within normal limits. Appendix is not definitively visualized. No evidence of bowel wall thickening, distention, or inflammatory changes. Vascular/Lymphatic: Aortic atherosclerosis.  No lymphadenopathy. Reproductive: Status post hysterectomy. No adnexal masses. Other: Small fat containing umbilical hernia. Musculoskeletal: Interval progression of anterolisthesis at L4 on L5, previously 5 mm, now 8 mm. No acute fracture or suspicious osseous lesion. IMPRESSION: No acute process in the abdomen or pelvis. No etiology seen for the patient's abdominal pain. Electronically Signed   By: Wiliam Ke M.D.   On: 01/11/2022 03:09   ? ?Procedures ?Procedures  ? ? ?Medications Ordered in ED ?Medications  ?iohexol (OMNIPAQUE) 300 MG/ML solution 80 mL (80 mLs Intravenous Contrast Given 01/11/22 0238)  ?oxyCODONE-acetaminophen (PERCOCET/ROXICET) 5-325 MG per tablet 1 tablet (1 tablet Oral Given 01/11/22 0637)  ? ? ?ED Course/ Medical Decision Making/ A&P ?  ?                        ?Medical Decision  Making ?Risk ?Prescription drug management. ? ? ?This patient presents to the ED for concern of back and abdominal pain, this involves an extensive number of treatment options, and is a complaint that carries with

## 2022-01-11 NOTE — ED Notes (Signed)
Patient transported to CT 

## 2022-01-11 NOTE — Discharge Instructions (Addendum)
You were seen today for back pain and abdominal pain.  Your work-up remains reassuring.  You do have some CT findings that show some abnormal alignment of the lumbar vertebra which sometimes can cause pain.  You will be sent home pain medication.  Follow-up with your primary physician and neurology.  Additionally, home health referral has been placed. ?

## 2022-01-12 ENCOUNTER — Ambulatory Visit: Payer: Medicare Other | Admitting: Occupational Therapy

## 2022-01-12 ENCOUNTER — Ambulatory Visit: Payer: Medicare Other | Admitting: Physical Therapy

## 2022-01-14 ENCOUNTER — Ambulatory Visit (INDEPENDENT_AMBULATORY_CARE_PROVIDER_SITE_OTHER): Payer: Medicare Other | Admitting: Family Medicine

## 2022-01-14 ENCOUNTER — Encounter: Payer: Self-pay | Admitting: Family Medicine

## 2022-01-14 ENCOUNTER — Telehealth: Payer: Self-pay | Admitting: Family Medicine

## 2022-01-14 VITALS — BP 166/88 | HR 89 | Ht 61.0 in | Wt 158.2 lb

## 2022-01-14 DIAGNOSIS — G8929 Other chronic pain: Secondary | ICD-10-CM | POA: Diagnosis not present

## 2022-01-14 DIAGNOSIS — M545 Low back pain, unspecified: Secondary | ICD-10-CM | POA: Diagnosis not present

## 2022-01-14 DIAGNOSIS — I10 Essential (primary) hypertension: Secondary | ICD-10-CM | POA: Diagnosis not present

## 2022-01-14 MED ORDER — DICLOFENAC SODIUM 1 % EX GEL: 4.0000 g | Freq: Four times a day (QID) | CUTANEOUS | 1 refills | Status: DC

## 2022-01-14 NOTE — Telephone Encounter (Signed)
Husband calls nurse returning call.  ? ?Husband states Nela missed a few PT apts due to illness. He states PT needs a letter to resume PT care.  ? ?I called [facility] to give verbal to resume care or to see what is needed. Representative reported a new referral is fine.  ? ?This has already been placed.  ? ?Will forward to Jazmin.  ?

## 2022-01-14 NOTE — Telephone Encounter (Signed)
Pt needs a letter of referral to continue therapy. Please call patient [phone]     ?

## 2022-01-14 NOTE — Telephone Encounter (Signed)
Called patient back and got VM. Referral for PT placed, if patient returns call can we please clarify if any other orders needed. ? ?Yehuda Savannah MD ?

## 2022-01-14 NOTE — Patient Instructions (Signed)
It was wonderful to see you today. ? ?Please bring ALL of your medications with you to every visit.  ? ?Today we talked about: ? ? -we sent in Voltaren gel to use on your back, you can use it up to four times a day ?- We referred you to orthopedics to see if further imaging or injections is an option ?- continue your physical therapy ? ?If you have incontinence of stool or urine, can't feel when you wipe, have fevers or chills or redness on your back, please go to the ED immediately! ? ?Please make a follow up in 2 weeks! ? ? ?Thank you for choosing [facility].  ? ?Please call [phone] with any questions about today's appointment. ? ?Please be sure to schedule follow up at the front  desk before you leave today.  ? ?Please arrive at least 15 minutes prior to your scheduled appointments. ?  ?If you had blood work today, I will send you a MyChart message or a letter if results are normal. Otherwise, I will give you a call. ?  ?If you had a referral placed, they will call you to set up an appointment. Please give [place] a call if you don't hear back in the next 2 weeks. ?  ?If you need additional refills before your next appointment, please call your pharmacy first.  ? ?Burley Saver, MD  ?Family Medicine   ?

## 2022-01-14 NOTE — Assessment & Plan Note (Signed)
-   normal neurologic exam, no red flag signs or symptoms, with shared decision making discussed that she will continue PT, stop naproxen but use Voltaren gel, use tylenol for pain, the few oxycodone she has left for breakthrough ?- will refer to orthopedics for consideration of MRI vs injections for pain control ?- discussed ED/return precautions ?- f/u in 2 weeks ?

## 2022-01-14 NOTE — Assessment & Plan Note (Signed)
-   elevated today, titrated with home cuff, unsure of home medications, will call and let me know what she is taking and we will adjust, although she sometimes runs low at home, and I suspect pain and taking naproxen could be contributing ?

## 2022-01-16 ENCOUNTER — Inpatient Hospital Stay (HOSPITAL_COMMUNITY)
Admission: EM | Admit: 2022-01-16 | Discharge: 2022-01-19 | DRG: 308 | Disposition: A | Discharge status: Skilled Nursing Facility | Payer: Medicare Other | Attending: Family Medicine | Admitting: Family Medicine

## 2022-01-16 ENCOUNTER — Observation Stay (HOSPITAL_COMMUNITY): Payer: Medicare Other

## 2022-01-16 ENCOUNTER — Emergency Department (HOSPITAL_COMMUNITY): Payer: Medicare Other

## 2022-01-16 ENCOUNTER — Encounter (HOSPITAL_COMMUNITY): Payer: Self-pay | Admitting: Emergency Medicine

## 2022-01-16 ENCOUNTER — Other Ambulatory Visit (HOSPITAL_COMMUNITY): Payer: Medicare Other

## 2022-01-16 DIAGNOSIS — R402252 Coma scale, best verbal response, oriented, at arrival to emergency department: Secondary | ICD-10-CM | POA: Diagnosis present

## 2022-01-16 DIAGNOSIS — B9620 Unspecified Escherichia coli [E. coli] as the cause of diseases classified elsewhere: Secondary | ICD-10-CM | POA: Diagnosis present

## 2022-01-16 DIAGNOSIS — M25512 Pain in left shoulder: Secondary | ICD-10-CM | POA: Diagnosis present

## 2022-01-16 DIAGNOSIS — R402362 Coma scale, best motor response, obeys commands, at arrival to emergency department: Secondary | ICD-10-CM | POA: Diagnosis present

## 2022-01-16 DIAGNOSIS — Z8673 Personal history of transient ischemic attack (TIA), and cerebral infarction without residual deficits: Secondary | ICD-10-CM

## 2022-01-16 DIAGNOSIS — M4316 Spondylolisthesis, lumbar region: Secondary | ICD-10-CM | POA: Diagnosis present

## 2022-01-16 DIAGNOSIS — Y92009 Unspecified place in unspecified non-institutional (private) residence as the place of occurrence of the external cause: Secondary | ICD-10-CM

## 2022-01-16 DIAGNOSIS — M7989 Other specified soft tissue disorders: Secondary | ICD-10-CM | POA: Diagnosis present

## 2022-01-16 DIAGNOSIS — E119 Type 2 diabetes mellitus without complications: Secondary | ICD-10-CM | POA: Diagnosis present

## 2022-01-16 DIAGNOSIS — I480 Paroxysmal atrial fibrillation: Secondary | ICD-10-CM | POA: Diagnosis not present

## 2022-01-16 DIAGNOSIS — I10 Essential (primary) hypertension: Secondary | ICD-10-CM

## 2022-01-16 DIAGNOSIS — I2699 Other pulmonary embolism without acute cor pulmonale: Secondary | ICD-10-CM | POA: Diagnosis present

## 2022-01-16 DIAGNOSIS — Z79899 Other long term (current) drug therapy: Secondary | ICD-10-CM

## 2022-01-16 DIAGNOSIS — M2042 Other hammer toe(s) (acquired), left foot: Secondary | ICD-10-CM | POA: Diagnosis present

## 2022-01-16 DIAGNOSIS — W19XXXA Unspecified fall, initial encounter: Secondary | ICD-10-CM | POA: Diagnosis present

## 2022-01-16 DIAGNOSIS — Z791 Long term (current) use of non-steroidal anti-inflammatories (NSAID): Secondary | ICD-10-CM

## 2022-01-16 DIAGNOSIS — I2510 Atherosclerotic heart disease of native coronary artery without angina pectoris: Secondary | ICD-10-CM | POA: Diagnosis present

## 2022-01-16 DIAGNOSIS — E669 Obesity, unspecified: Secondary | ICD-10-CM | POA: Diagnosis present

## 2022-01-16 DIAGNOSIS — K5909 Other constipation: Secondary | ICD-10-CM | POA: Diagnosis present

## 2022-01-16 DIAGNOSIS — R4182 Altered mental status, unspecified: Secondary | ICD-10-CM

## 2022-01-16 DIAGNOSIS — E039 Hypothyroidism, unspecified: Secondary | ICD-10-CM | POA: Diagnosis present

## 2022-01-16 DIAGNOSIS — Z9181 History of falling: Secondary | ICD-10-CM

## 2022-01-16 DIAGNOSIS — I214 Non-ST elevation (NSTEMI) myocardial infarction: Secondary | ICD-10-CM

## 2022-01-16 DIAGNOSIS — R946 Abnormal results of thyroid function studies: Secondary | ICD-10-CM | POA: Diagnosis present

## 2022-01-16 DIAGNOSIS — Z6830 Body mass index (BMI) 30.0-30.9, adult: Secondary | ICD-10-CM

## 2022-01-16 DIAGNOSIS — R778 Other specified abnormalities of plasma proteins: Secondary | ICD-10-CM

## 2022-01-16 DIAGNOSIS — W01190A Fall on same level from slipping, tripping and stumbling with subsequent striking against furniture, initial encounter: Secondary | ICD-10-CM | POA: Diagnosis present

## 2022-01-16 DIAGNOSIS — M545 Low back pain, unspecified: Secondary | ICD-10-CM | POA: Diagnosis present

## 2022-01-16 DIAGNOSIS — I872 Venous insufficiency (chronic) (peripheral): Secondary | ICD-10-CM | POA: Diagnosis present

## 2022-01-16 DIAGNOSIS — Z79890 Hormone replacement therapy (postmenopausal): Secondary | ICD-10-CM

## 2022-01-16 DIAGNOSIS — I493 Ventricular premature depolarization: Secondary | ICD-10-CM | POA: Diagnosis present

## 2022-01-16 DIAGNOSIS — G20A1 Parkinson's disease without dyskinesia, without mention of fluctuations: Secondary | ICD-10-CM

## 2022-01-16 DIAGNOSIS — E538 Deficiency of other specified B group vitamins: Secondary | ICD-10-CM | POA: Diagnosis present

## 2022-01-16 DIAGNOSIS — I700 Atherosclerosis of aorta: Secondary | ICD-10-CM | POA: Diagnosis present

## 2022-01-16 DIAGNOSIS — Z87891 Personal history of nicotine dependence: Secondary | ICD-10-CM

## 2022-01-16 DIAGNOSIS — N390 Urinary tract infection, site not specified: Secondary | ICD-10-CM | POA: Diagnosis present

## 2022-01-16 DIAGNOSIS — I4891 Unspecified atrial fibrillation: Secondary | ICD-10-CM | POA: Diagnosis not present

## 2022-01-16 DIAGNOSIS — Y9301 Activity, walking, marching and hiking: Secondary | ICD-10-CM | POA: Diagnosis present

## 2022-01-16 DIAGNOSIS — A419 Sepsis, unspecified organism: Secondary | ICD-10-CM

## 2022-01-16 DIAGNOSIS — D509 Iron deficiency anemia, unspecified: Secondary | ICD-10-CM | POA: Diagnosis present

## 2022-01-16 DIAGNOSIS — N179 Acute kidney failure, unspecified: Secondary | ICD-10-CM | POA: Diagnosis present

## 2022-01-16 DIAGNOSIS — S060XAA Concussion with loss of consciousness status unknown, initial encounter: Secondary | ICD-10-CM | POA: Diagnosis present

## 2022-01-16 DIAGNOSIS — S060X1A Concussion with loss of consciousness of 30 minutes or less, initial encounter: Secondary | ICD-10-CM | POA: Insufficient documentation

## 2022-01-16 DIAGNOSIS — E7800 Pure hypercholesterolemia, unspecified: Secondary | ICD-10-CM | POA: Diagnosis present

## 2022-01-16 DIAGNOSIS — G20 Parkinson's disease: Secondary | ICD-10-CM | POA: Diagnosis present

## 2022-01-16 DIAGNOSIS — R402142 Coma scale, eyes open, spontaneous, at arrival to emergency department: Secondary | ICD-10-CM | POA: Diagnosis present

## 2022-01-16 DIAGNOSIS — Z90710 Acquired absence of both cervix and uterus: Secondary | ICD-10-CM

## 2022-01-16 DIAGNOSIS — G8929 Other chronic pain: Secondary | ICD-10-CM | POA: Diagnosis present

## 2022-01-16 DIAGNOSIS — Z20822 Contact with and (suspected) exposure to covid-19: Secondary | ICD-10-CM | POA: Diagnosis present

## 2022-01-16 LAB — URINALYSIS, ROUTINE W REFLEX MICROSCOPIC
Bilirubin Urine: NEGATIVE
Glucose, UA: NEGATIVE mg/dL
Ketones, ur: 5 mg/dL — AB
Nitrite: NEGATIVE
Protein, ur: 30 mg/dL — AB
Specific Gravity, Urine: 1.014 (ref 1.005–1.030)
pH: 5 (ref 5.0–8.0)

## 2022-01-16 LAB — LACTIC ACID, PLASMA
Lactic Acid, Venous: 1.7 mmol/L (ref 0.5–1.9)
Lactic Acid, Venous: 1.9 mmol/L (ref 0.5–1.9)

## 2022-01-16 LAB — COMPREHENSIVE METABOLIC PANEL
ALT: <5 U/L (ref 0–44)
AST: 24 U/L (ref 15–41)
Albumin: 3.3 g/dL — ABNORMAL LOW (ref 3.5–5.0)
Alkaline Phosphatase: 49 U/L (ref 38–126)
Anion gap: 5 (ref 5–15)
BUN: 25 mg/dL — ABNORMAL HIGH (ref 8–23)
CO2: 28 mmol/L (ref 22–32)
Calcium: 8.8 mg/dL — ABNORMAL LOW (ref 8.9–10.3)
Chloride: 108 mmol/L (ref 98–111)
Creatinine, Ser: 1.08 mg/dL — ABNORMAL HIGH (ref 0.44–1.00)
GFR, Estimated: 56 mL/min — ABNORMAL LOW (ref >60)
Glucose, Bld: 122 mg/dL — ABNORMAL HIGH (ref 70–99)
Potassium: 3.9 mmol/L (ref 3.5–5.1)
Sodium: 141 mmol/L (ref 135–145)
Total Bilirubin: 0.6 mg/dL (ref 0.3–1.2)
Total Protein: 6.4 g/dL — ABNORMAL LOW (ref 6.5–8.1)

## 2022-01-16 LAB — GLUCOSE, CAPILLARY: Glucose-Capillary: 100 mg/dL — ABNORMAL HIGH (ref 70–99)

## 2022-01-16 LAB — CBC WITH DIFFERENTIAL/PLATELET
Abs Immature Granulocytes: 0.02 10*3/uL (ref 0.00–0.07)
Basophils Absolute: 0 10*3/uL (ref 0.0–0.1)
Basophils Relative: 1 %
Eosinophils Absolute: 0.2 10*3/uL (ref 0.0–0.5)
Eosinophils Relative: 3 %
HCT: 30.9 % — ABNORMAL LOW (ref 36.0–46.0)
Hemoglobin: 9.9 g/dL — ABNORMAL LOW (ref 12.0–15.0)
Immature Granulocytes: 0 %
Lymphocytes Relative: 29 %
Lymphs Abs: 1.9 10*3/uL (ref 0.7–4.0)
MCH: 24.9 pg — ABNORMAL LOW (ref 26.0–34.0)
MCHC: 32 g/dL (ref 30.0–36.0)
MCV: 77.8 fL — ABNORMAL LOW (ref 80.0–100.0)
Monocytes Absolute: 0.4 10*3/uL (ref 0.1–1.0)
Monocytes Relative: 7 %
Neutro Abs: 4 10*3/uL (ref 1.7–7.7)
Neutrophils Relative %: 60 %
Platelets: 350 10*3/uL (ref 150–400)
RBC: 3.97 MIL/uL (ref 3.87–5.11)
RDW: 15.2 % (ref 11.5–15.5)
WBC: 6.5 10*3/uL (ref 4.0–10.5)
nRBC: 0 % (ref 0.0–0.2)

## 2022-01-16 LAB — APTT: aPTT: 28 seconds (ref 24–36)

## 2022-01-16 LAB — RESP PANEL BY RT-PCR (FLU A&B, COVID) ARPGX2
Influenza A by PCR: NEGATIVE
Influenza B by PCR: NEGATIVE
SARS Coronavirus 2 by RT PCR: NEGATIVE

## 2022-01-16 LAB — PROTIME-INR
INR: 1.1 (ref 0.8–1.2)
Prothrombin Time: 14.1 seconds (ref 11.4–15.2)

## 2022-01-16 LAB — TROPONIN I (HIGH SENSITIVITY)
Troponin I (High Sensitivity): 6 ng/L (ref <18)
Troponin I (High Sensitivity): 52 ng/L — ABNORMAL HIGH (ref <18)
Troponin I (High Sensitivity): 551 ng/L (ref <18)

## 2022-01-16 LAB — HEPARIN LEVEL (UNFRACTIONATED): Heparin Unfractionated: 0.35 IU/mL (ref 0.30–0.70)

## 2022-01-16 MED ORDER — AMIODARONE HCL IN DEXTROSE 360-4.14 MG/200ML-% IV SOLN
60.0000 mg/h | INTRAVENOUS | Status: DC
  Administered 2022-01-16: 60 mg/h via INTRAVENOUS
  Filled 2022-01-16: qty 200

## 2022-01-16 MED ORDER — SODIUM CHLORIDE 0.9 % IV SOLN
2.0000 g | Freq: Once | INTRAVENOUS | Status: AC
  Administered 2022-01-16: 2 g via INTRAVENOUS
  Filled 2022-01-16: qty 12.5

## 2022-01-16 MED ORDER — NALOXONE HCL 0.4 MG/ML IJ SOLN
0.4000 mg | INTRAMUSCULAR | Status: DC | PRN
  Administered 2022-01-16: 1 mg via INTRAVENOUS
  Filled 2022-01-16: qty 1

## 2022-01-16 MED ORDER — APIXABAN 5 MG PO TABS
5.0000 mg | ORAL_TABLET | Freq: Two times a day (BID) | ORAL | Status: DC
Start: 2022-01-16 — End: 2022-01-19
  Administered 2022-01-16 – 2022-01-19 (×6): 5 mg via ORAL
  Filled 2022-01-16 (×6): qty 1

## 2022-01-16 MED ORDER — AMIODARONE LOAD VIA INFUSION
150.0000 mg | Freq: Once | INTRAVENOUS | Status: AC
Start: 2022-01-16 — End: 2022-01-16
  Administered 2022-01-16: 150 mg via INTRAVENOUS
  Filled 2022-01-16: qty 83.34

## 2022-01-16 MED ORDER — LIDOCAINE 5 % EX PTCH
1.0000 | MEDICATED_PATCH | CUTANEOUS | Status: DC
  Administered 2022-01-16 – 2022-01-19 (×4): 1 via TRANSDERMAL
  Filled 2022-01-16 (×4): qty 1

## 2022-01-16 MED ORDER — LACTATED RINGERS IV BOLUS
1000.0000 mL | Freq: Once | INTRAVENOUS | Status: AC
  Administered 2022-01-16: 1000 mL via INTRAVENOUS

## 2022-01-16 MED ORDER — VANCOMYCIN HCL 1500 MG/300ML IV SOLN
1500.0000 mg | Freq: Once | INTRAVENOUS | Status: AC
  Administered 2022-01-16: 1500 mg via INTRAVENOUS
  Filled 2022-01-16: qty 300

## 2022-01-16 MED ORDER — CARBIDOPA-LEVODOPA ER 25-100 MG PO TBCR: 2.0000 | EXTENDED_RELEASE_TABLET | ORAL | Status: DC

## 2022-01-16 MED ORDER — ROSUVASTATIN CALCIUM 5 MG PO TABS
10.0000 mg | ORAL_TABLET | Freq: Every day | ORAL | Status: DC
Start: 2022-01-17 — End: 2022-01-20
  Administered 2022-01-17 – 2022-01-19 (×3): 10 mg via ORAL
  Filled 2022-01-16 (×3): qty 2

## 2022-01-16 MED ORDER — LACTATED RINGERS IV SOLN: INTRAVENOUS | Status: DC

## 2022-01-16 MED ORDER — VANCOMYCIN HCL IN DEXTROSE 1-5 GM/200ML-% IV SOLN
1000.0000 mg | Freq: Once | INTRAVENOUS | Status: DC
  Filled 2022-01-16: qty 200

## 2022-01-16 MED ORDER — METOPROLOL TARTRATE 25 MG PO TABS
25.0000 mg | ORAL_TABLET | Freq: Two times a day (BID) | ORAL | Status: DC
  Administered 2022-01-16 – 2022-01-19 (×6): 25 mg via ORAL
  Filled 2022-01-16 (×6): qty 1

## 2022-01-16 MED ORDER — CARBIDOPA-LEVODOPA ER 25-100 MG PO TBCR
2.0000 | EXTENDED_RELEASE_TABLET | Freq: Three times a day (TID) | ORAL | Status: DC
  Administered 2022-01-16: 2 via ORAL
  Filled 2022-01-16 (×2): qty 3

## 2022-01-16 MED ORDER — HEPARIN (PORCINE) 25000 UT/250ML-% IV SOLN
1000.0000 [IU]/h | INTRAVENOUS | Status: AC
  Administered 2022-01-16: 1000 [IU]/h via INTRAVENOUS
  Filled 2022-01-16: qty 250

## 2022-01-16 MED ORDER — AMIODARONE HCL IN DEXTROSE 360-4.14 MG/200ML-% IV SOLN: 30.0000 mg/h | INTRAVENOUS | Status: DC

## 2022-01-16 MED ORDER — IRBESARTAN 300 MG PO TABS
150.0000 mg | ORAL_TABLET | Freq: Every day | ORAL | Status: DC
  Administered 2022-01-16 – 2022-01-17 (×2): 150 mg via ORAL
  Filled 2022-01-16 (×2): qty 1

## 2022-01-16 MED ORDER — POLYETHYLENE GLYCOL 3350 17 G PO PACK
17.0000 g | PACK | Freq: Every day | ORAL | Status: DC | PRN
  Administered 2022-01-16: 17 g via ORAL
  Filled 2022-01-16: qty 1

## 2022-01-16 MED ORDER — NALOXONE HCL 2 MG/2ML IJ SOSY: 1.0000 mg | PREFILLED_SYRINGE | INTRAMUSCULAR | Status: DC | PRN

## 2022-01-16 MED ORDER — HEPARIN (PORCINE) 25000 UT/250ML-% IV SOLN: 12.0000 [IU]/kg/h | INTRAVENOUS | Status: DC

## 2022-01-16 MED ORDER — CARBIDOPA-LEVODOPA ER 25-100 MG PO TBCR
3.0000 | EXTENDED_RELEASE_TABLET | ORAL | Status: DC
  Administered 2022-01-17 – 2022-01-19 (×3): 3 via ORAL
  Filled 2022-01-16 (×4): qty 3

## 2022-01-16 MED ORDER — CARBIDOPA-LEVODOPA ER 25-100 MG PO TBCR
2.0000 | EXTENDED_RELEASE_TABLET | Freq: Two times a day (BID) | ORAL | Status: DC
  Administered 2022-01-16 – 2022-01-19 (×7): 2 via ORAL
  Filled 2022-01-16 (×7): qty 2

## 2022-01-16 MED ORDER — METRONIDAZOLE 500 MG/100ML IV SOLN
500.0000 mg | Freq: Once | INTRAVENOUS | Status: AC
  Administered 2022-01-16: 500 mg via INTRAVENOUS
  Filled 2022-01-16: qty 100

## 2022-01-16 MED ORDER — LEVOTHYROXINE SODIUM 75 MCG PO TABS
150.0000 ug | ORAL_TABLET | Freq: Every day | ORAL | Status: DC
  Administered 2022-01-17 – 2022-01-19 (×3): 150 ug via ORAL
  Filled 2022-01-16 (×3): qty 2

## 2022-01-16 MED ORDER — KETOROLAC TROMETHAMINE 30 MG/ML IJ SOLN
30.0000 mg | Freq: Once | INTRAMUSCULAR | Status: AC
  Administered 2022-01-16: 30 mg via INTRAVENOUS
  Filled 2022-01-16: qty 1

## 2022-01-16 NOTE — ED Triage Notes (Signed)
Pt bib [facility] from home for fall with possible LOC. Upon arrival to home, EMS noted GCS of 8 initially which has improved to GCS 15 since arrival to ED. Pt recalls missing a step while walking prior to fall. BP 56/30, with intermittent a fib RVR. NS given via 20G IV, pressure improved to 80/60. Capnography 20; RR 20-40; CBG 149.  ? ?Hx Parkinsons, recently seen for UTI with antibiotic prescription.  ?

## 2022-01-16 NOTE — ED Notes (Signed)
PT. York Spaniel she could not stand for orthostatics vs ?

## 2022-01-16 NOTE — Progress Notes (Signed)
Received notification from attending Dr. Leveda Anna regarding critical troponin.  Of note, patient did endorse crushing chest pain this morning after her fall prior to admission, however this had resolved by the time she was in the emergency room.  No recurrence of chest pain since. ? ? ? ?Ordered stat EKG.  ? ?Spoke with cardiologist on-call Dr. Mayford Knife who believes this may very well be demand ischemia based on the new onset A-fib with RVR and sepsis.  Dr. Mayford Knife recommends stat echo, which the cardiology fellow will read and follow-up on. ? ?Greatly appreciate cardiology's evaluation. ? ?[name], MD ? ?

## 2022-01-16 NOTE — Progress Notes (Signed)
Given that patient is now in NSR, will convert from heparin drip to Eliquis. RN to STOP heparin drip at the time of Eliquis administration.  ? ?[name], MD ? ?

## 2022-01-16 NOTE — Progress Notes (Signed)
FPTS Interim Progress Note ? ?When to evaluate patient after critical troponin values resulted. Had chest pain on initial presentation that has since resolved. Trop 6>52>553 possibly due to demand ischemia. Patient remains stable in room and vital signs have been normal, maintaining MAP. Continuing with STAT echo, ekg, trop. Cardiology following, appreciate assistance.  ? ?Now in NSR from Afib this AM.  ? ?Alfredo Martinez, MD ?01/16/2022, 5:01 PM ?PGY-1, [facility] ?Service pager [phone] ? ?

## 2022-01-16 NOTE — ED Notes (Signed)
Patient transported to X-ray 

## 2022-01-16 NOTE — Progress Notes (Addendum)
FPTS Brief Progress Note ? ?S: Seen at bedside along with Dr. Barbaraann Faster.  Patient states that she is feeling "better."  Denies chest pain, shortness of breath.  She states she was having dysuria earlier this month but this was resolved with antibiotic treatment. ? ? ?O: ?BP 128/65 (BP Location: Right Arm)   Pulse 77   Temp 98.8 ?F (37.1 ?C) (Oral)   Resp 15   Ht 5\' 1"  (1.549 m)   Wt 71.8 kg   SpO2 95%   BMI 29.91 kg/m?   ?Resting in bed comfortably, breathing comfortably on 2 L [place] ?Neuro: Alert and oriented x3, conversant and follows commands ? ?A/P: ?Admitted earlier today s/p fall found to have new onset A-fib with RVR, now in NSR. ?Hypotensive, started on broad-spectrum antibiotics due to meeting sepsis criteria.  Now normotensive.  Denies dysuria currently.  May be able to de-escalate antibiotics tomorrow pending clinical improvement. ?Elevated troponin which has peaked, felt to be due to demand ischemia related to A-fib with RVR.  No chest pain currently.  TTE is pending. ?Encephalopathy resolved, currently at baseline. ?CBG monitoring discontinued, glucose has been appropriate and patient has no history of diabetes (last A1c 4.9 six months prior) ?- Orders reviewed. Labs for AM ordered, which was adjusted as needed.  ?- If condition changes, plan includes reach out to on-call cardiologist if any cardiac concerns.  ? ? , MD ?01/16/2022, 10:04 PM ?PGY-2, [place] [facility] Night Resident  ?Please page [phone] with questions.  ?  ?

## 2022-01-16 NOTE — ED Notes (Signed)
Pt believes she needs to have a BM, pt placed on bed pan. Pt had very very small BM, pt cleaned, linens and pads ?

## 2022-01-16 NOTE — Consult Note (Addendum)
?Cardiology Consultation:  ? ?Patient ID: Anna Bernard ?MRN: [id number]; DOB: 11-30-1950 ? ?Admit date: 01/16/2022 ?Date of Consult: 01/16/2022 ? ?Primary Care Provider: [name], MD ?[facility] Cardiologist: None (NEW) ?[facility] Electrophysiologist:  None  ? ? ?Patient Profile:  ? ?Anna Bernard is a 71 y.o. female with a hx of hyperlipidemia, hypertension, prediabetes who is being seen today for the evaluation of atrial fibrillation with RVR and elevated troponin at the request of Madison Hickman, MD. ? ?History of Present Illness:  ? ?Anna Bernard is a 71 year old female with a history of hyperlipidemia, hypertension, prediabetes who was admitted on 01/16/2022 with dizziness and recurrent falls.  She does have a history of Parkinson's disease as well as vitamin B12 deficiency, microcytic anemia and chronic low back pain.  Her fall started a several months ago and mainly occur when she is going to the bathroom up walking around.  This morning she went to go to the bathroom and became dizzy and fell and hit her head on the cabinet and husband reports she passed out. ?The patient apparently admits to noticing her heart racing for the past couple of weeks.  ? ?In ER found to be in atrial fibrillation with RVR.  Pertinent labs included serum creatinine 1.08, potassium 3.9, magnesium 2.2 .  Initial hs Trop was 6 but trended upward to 52>>553.  Hemoglobin 9.9.  Chest x-ray showed no active disease.  Initial twelve-lead EKG showed atrial fibrillation with occasional PVCs, early R wave transition in the precordial leads and nonspecific ST abnormality.  Patient converted spontaneously to sinus rhythm and repeat EKG showed normal sinus rhythm with nonspecific ST abnormality.  Cardiology is now asked to consult. ? ?The patient apparently complained of chest pain on initial presentation to the ER but denies any chest pain now. ? ?Past Medical History:  ?Diagnosis Date  ? Anemia, unspecified   ? Hyperlipidemia,  unspecified   ? Hypertension   ? Hypothyroidism, unspecified   ? Pre-diabetes   ? Vitamin B12 deficiency   ? ? ?Past Surgical History:  ?Procedure Laterality Date  ? ABDOMINAL HYSTERECTOMY    ? GIVENS CAPSULE STUDY N/A 04/16/2018  ? Procedure: GIVENS CAPSULE STUDY;  Surgeon: Carol Ada, MD;  Location: [place];  Service: Endoscopy;  Laterality: N/A;  ? PARS PLANA VITRECTOMY Right 08/16/2020  ? 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 PARS PLANA VITRECTOMY Right 09/29/2020  ? Procedure: PARS PLANA VITRECTOMY WITH 25 GAUGE - ENDOCAUTRY;  Surgeon: Jalene Mullet, MD;  Location: [place];  Service: Ophthalmology;  Laterality: Right;  ? PHOTOCOAGULATION WITH LASER Right 08/16/2020  ? Procedure: PHOTOCOAGULATION WITH LASER;  Surgeon: Jalene Mullet, MD;  Location: [place];  Service: Ophthalmology;  Laterality: Right;  ? PHOTOCOAGULATION WITH LASER Right 09/29/2020  ? Procedure: PHOTOCOAGULATION WITH LASER;  Surgeon: Jalene Mullet, MD;  Location: [place];  Service: Ophthalmology;  Laterality: Right;  ? SHOULDER SURGERY    ? TONSILLECTOMY    ? TUBAL LIGATION    ?  ? ?Home Medications:  ?Prior to Admission medications   ?Medication Sig Start Date End Date Taking? Authorizing Provider  ?Carbidopa-Levodopa ER (SINEMET CR) 25-100 MG tablet controlled release 3 at 7am, 2 at 11 am, 2 at 4pm 07/26/21  Yes Tat, Eustace Quail, DO  ?diclofenac Sodium (VOLTAREN) 1 % GEL Apply 4 g topically 4 (four) times daily. 01/14/22  Yes [name], MD  ?ferrous sulfate 325 (65 FE) MG tablet TAKE 1 TABLET BY MOUTH EVERY DAY WITH BREAKFAST ?Patient taking differently: 325 mg daily with breakfast. 01/04/22  Yes Pray, Norwood Levo, MD  ?levothyroxine (SYNTHROID) 150 MCG tablet Take 1 tablet (150 mcg total) by mouth daily. 12/10/20  Yes Just, Laurita Quint, FNP  ?Multiple Vitamin (MULTIVITAMIN WITH MINERALS) TABS Take 1  tablet by mouth daily.   Yes [provider]  ?naproxen (NAPROSYN) 500 MG tablet Take 1 tablet (500 mg total) by mouth 2 (two) times daily. 01/04/22  Yes Truddie Hidden, MD  ?naproxen sodium (ALEVE) 220 MG tablet Take 220 mg by mouth daily as needed (pain).   Yes [provider]  ?olmesartan (BENICAR) 20 MG tablet Take 1 tablet (20 mg total) by mouth daily. 12/10/20  Yes Just, Laurita Quint, FNP  ?oxyCODONE-acetaminophen (PERCOCET/ROXICET) 5-325 MG tablet Take 1 tablet by mouth every 6 (six) hours as needed for severe pain. 01/11/22  Yes Horton, Barbette Hair, MD  ?polyethylene glycol powder (GLYCOLAX/MIRALAX) 17 GM/SCOOP powder Take 17 g by mouth 2 (two) times daily as needed for mild constipation or moderate constipation. 09/10/20  Yes Just, Laurita Quint, FNP  ?predniSONE (DELTASONE) 20 MG tablet Take 2 tablets (40 mg total) by mouth daily with breakfast. For the next four days 01/09/22  Yes Carmin Muskrat, MD  ?trimethoprim (TRIMPEX) 100 MG tablet Take 100 mg by mouth daily. 12/03/21  Yes [provider]  ?furosemide (LASIX) 20 MG tablet TAKE 1 TABLET BY MOUTH TWICE A DAY ?Patient not taking: Reported on 01/14/2022 03/14/20   Jacelyn Pi, Lilia Argue, MD  ?KLOR-CON M20 20 MEQ tablet TAKE 1 TABLET BY MOUTH TWICE A DAY ?Patient not taking: Reported on 01/14/2022 03/14/20   Jacelyn Pi, Lilia Argue, MD  ?rosuvastatin (CRESTOR) 10 MG tablet Take 1 tablet (10 mg total) by mouth daily. ?Patient not taking: Reported on 01/16/2022 12/10/20   Just, Laurita Quint, FNP  ? ? ?Inpatient Medications: ?Scheduled Meds: ? apixaban  5 mg Oral BID  ? Carbidopa-Levodopa ER  2 tablet Oral BID  ? And  ? [START ON 01/17/2022] Carbidopa-Levodopa ER  3 tablet Oral Q24H  ? [START ON 01/17/2022] levothyroxine  150 mcg Oral Daily  ? lidocaine  1 patch Transdermal Q24H  ? [START ON 01/17/2022] rosuvastatin  10 mg Oral Daily  ? ?Continuous Infusions: ? lactated ringers 150 mL/hr at 01/16/22 1258  ? ?PRN Meds: ?polyethylene glycol ? ?Allergies:   No  Known Allergies ? ?Social History:   ?Social History  ? ?Socioeconomic History  ? Marital status: Married  ?  Spouse name: Not on file  ? Number of children: Not on file  ? Years of education: Not on file  ? Highest education level: Not on file  ?Occupational History  ? Not on file  ?Tobacco Use  ? Smoking status: Former  ? Smokeless tobacco: Never  ? Tobacco comments:  ?  quit 30+ years ago  ?Vaping Use  ? Vaping Use: Never used  ?Substance and Sexual Activity  ? Alcohol use: No  ?  Alcohol/week: 0.0 standard drinks  ? Drug use: No  ? Sexual activity: Not Currently  ?Other Topics Concern  ? Not on file  ?Social History Narrative  ? Right handed  ? Lives with husband in a one story home.  ? ?Social Determinants of Health  ? ?Financial Resource Strain: Not on file  ?Food Insecurity: Not on file  ?Transportation Needs: Not on  file  ?Physical Activity: Not on file  ?Stress: Not on file  ?Social Connections: Not on file  ?Intimate Partner Violence: Not on file  ?  ?Family History:   ? ?Family History  ?Problem Relation Age of Onset  ? Renal Disease Sister   ? Cancer Sister   ?  ? ?ROS:  ?Please see the history of present illness.  ? ?All other ROS reviewed and negative.    ? ?Physical Exam/Data:  ? ?Vitals:  ? 01/16/22 1515 01/16/22 1530 01/16/22 1545 01/16/22 1632  ?BP: (!) 155/69 (!) 152/75 (!) 150/77 (!) 150/76  ?Pulse: 84 79 78   ?Resp: 17 15 14    ?Temp:    98.2 ?F (36.8 ?C)  ?TempSrc:    Oral  ?SpO2: 97% 99% 100%   ?Weight:      ?Height:      ? ? ?Intake/Output Summary (Last 24 hours) at 01/16/2022 1754 ?Last data filed at 01/16/2022 1120 ?Gross per 24 hour  ?Intake 1308.83 ml  ?Output --  ?Net 1308.83 ml  ? ? ?  01/16/2022  ?  5:06 AM 01/14/2022  ?  9:43 AM 01/10/2022  ?  8:05 PM  ?Last 3 Weights  ?Weight (lbs) 158 lb 4.6 oz 158 lb 3.2 oz 165 lb  ?Weight (kg) 71.8 kg 71.759 kg 74.844 kg  ?   ?Body mass index is 29.91 kg/m?.  ?General:  Well nourished, well developed, in no acute distress ?HEENT: normal ?Lymph: no  adenopathy ?Neck: no JVD ?Endocrine:  No thryomegaly ?Vascular: No carotid bruits; FA pulses 2+ bilaterally without bruits  ?Cardiac:  normal S1, S2; RRR; no murmur  ?Lungs:  clear to auscultation bilateral

## 2022-01-16 NOTE — ED Notes (Signed)
Patient transported to CT 

## 2022-01-16 NOTE — Hospital Course (Addendum)
Anna Bernard is a 71 y.o.female with a history of hypothyroidism, HTN, HLD, T2DM, microcytic anemia on oral iron, vit B12 deficiency, venous insufficiency, Parkinson's, chronic low back pain who was admitted to the [facility] Medicine Teaching Service at [facility] for recurrent falls. Her hospital course is detailed below: ? ?Recurrent Falls  ?New Dizziness  ?Mechanical Fall  Possible concussion ?Multiple episodes of falls in the past, dizziness prior to most recent fall complicated by bathrooms that prevent her from using her walker due to lack of space. Patient fell and hit her head, then had syncopal event after fall. Pt had a GCS of 8 initially, and met sepsis criteria (see below). CT head showed no acute hemorrhage and patient was monitored for changes in mental status throughout hospitalization. She quickly returned to baseline. Appears that fall could be secondary to advancing parkinson's disease with possible component of spinal cord impingement.  Cardiology was consulted and obtained updated echo that showed EF 60 to 65%, mild LVH,  L atrium mod dilated, not changed from previously.  PT and OT recommended inpatient rehab but was screened out, patient was amenable from short term SNF.  New onset dizziness that was not present in previous falls could very likely be in the setting of A-fib, patient spontaneously converted to NSR.  ? ?A-fib with RVR ?She does not appear the patient had a history of A-fib prior to admission.  Patient's rate was initially in the 140s in A-fib.  Heparin drip started in the ED, started amiodarone load but then discontinued this shortly after.  Patient converted to normal sinus rhythm prior to echo being done. Echo showed EF 60 to 65%, mild LVH,  L atrium mod dilated, not changed from previously. Coronary CTA ordered and showed nonobstructive CAD and PE discussed below. Eliquis was continued at discharge. Discussed risks and benefits with patient about anticoagulation in depth.   ? ?Elevated Troponin (Demand ischemia) ?52>>553 initially. Likely 2/2 to demand ischemia in setting of rapid Afib. ? ?Nonocclusive R Sided PE ?Visualized on coronary CTA and confirmed via radiology. Vitals remained stable, no tachcardia or hypoxia. Patient did not have any chest pain or shortness of breath.  Does have possible PFO on echocardiogram. Eliquis 10 mg BID for 7 days given. ? ?UTI sepsis ?Patient met sepsis criteria with tachypnea, tachycardia, hypotension and known source of infection.  UA showed many bacteria and leukocytes, started treatment with empiric antibiotics given sepsis cefepime, metronidazole, vancomycin.  De-escalated appropriately and urine culture showed >100,000 colonies of E coli-ESBL. Was given CTX x1 and fosfomycin x 1 after sensitivities returned. ? ? ?Other chronic conditions were medically managed with home medications and formulary alternatives as necessary (hyperlipidemia, hypothyroidism, hypertension, Parkinson's disease, chronic lower back pain, chronic constipation) ? ?

## 2022-01-16 NOTE — Plan of Care (Signed)
Initially paged to the [facility], but patient follows with the family medicine teaching service.  Patient with PMH of Parkinson's and recent UTI treated with antibiotics came in from home after fall.  Question of loss of consciousness.  Initially hypotensive blood pressure 56/30 with EMS and had intermittent A-fib with RVR.  Given IV fluids with improvements in blood pressure.  Blood cultures were obtained.  Patient started on broad-spectrum antibiotics in case of underlying sepsis.  UA appears to be likely source and CXR was otherwise clear.  Heparin drip started per pharmacy by the ED provider although not clear patient should be on anticoagulation given frequent falls.  Signed out to Dr. Zigmund Daniel of [place]. ?

## 2022-01-16 NOTE — ED Notes (Signed)
Pt requesting to be put on bed pan, pt unable to have a BM, c/o constipation. ?

## 2022-01-16 NOTE — Sepsis Progress Note (Signed)
Elink monitoring for Sepsis Protocol 

## 2022-01-16 NOTE — H&P (Signed)
Family Medicine Teaching Service ?Hospital Admission History and Physical ?Service Pager: [phone] ? ?Patient name: Anna Bernard record number: [id number] ?Date of birth: 03/25/1951 Age: 71 y.o. Gender: female ? ?Primary Care Provider: [name], MD ?Consultants: Cardiology ?Code Status: FULL  ?Preferred Emergency Contact:  ?Contact Information   ? ? Name Relation Home Work Mobile  ? Anna Bernard, Anna Bernard Spouse [phone]  [phone]  ? Anna Bernard Daughter   [phone]  ? ?  ? ? ?Chief Complaint: Recurrent falls  ? ?Assessment and Plan: ?Anna Bernard is a 71 y.o. female presenting with . PMH is significant for hypothyroidism, HTN, HLD, T2DM, microcytic anemia on oral iron, vit B12 deficiency, venous insufficiency, Parkinson's, chronic low back pain.  ? ?Recurrent Falls  ?New Dizziness  ?Multiple episodes of falls in the past, new onset Afib with RVR in ED today (see below) in addition to hypotension that could be contributing to new fall and dizziness. Falls could very likely be secondary to advancing Parkinson's vs spinal cord impingement with previous dx of spondylolisthesis although neurologic exam is unremarkable for new focal deficits. Falls do not appear to be secondary to syncopal events per patient report. CT head negative for acute intracranial abnormality, no signs of hemorrhage. Will order orthostatic vitals as well. Consider updated echo, last echo performed 09/2021 with LVEF 65-70% with moderate LVH G1DD.  ?-- Admit to FPTS (progressive), attending Anna Bernard  ?-- VS per protocol  ?-- Updated echo ordered ?- Consider lumbar MRI ?- Fall precautions ?- Every 4 neuro checks ?- Consider updated CT if mental status changes ?- Orthostatic vitals ?- Continuous pulse ox, cardiac monitoring ?- PT/OT evaluate and treat ?- S/p LR 1 L ?- LR at 150 mL/hour x 20 hr  ?- Heart healthy diet ?- Heparin GTT covering VTE PPx ? ?New Afib with RVR ?Patient presented with new onset A-fib to our  knowledge, heart rate in 130-140s in the room.  Patient is also intermittently hypotensive with good response to fluid resuscitation with last echo showing LVEF 65 to 70% with moderate left ventricular hypertrophy and grade 1 diastolic dysfunction.  Started amiodarone drip in addition to heparin gtt. However, discontinued amio gtt due to unknown duration of afib without updated echo for embolus assessment. New echo ordered for evaluation as she has new diagnosis of A-fib and needs fluid resuscitation for hypotension.  Very possible that new A-fib is secondary to infection, patient met septic criteria when she presented to the hospital. Continue with cardiology assistance with new diagnosis. ?- Heparin gtt ?- Consider cardiology consult ?- Fluid resuscitation as above ?- Updated echo ?- Continuous cardiac monitoring ?-Strict ins and outs ?- Daily weights ? ?Encephalopathy, resolved  ?UTI Sepsis  ?On admission, patient met sepsis criteria with tachypnea, tachycardia, hypotension with known source of infection.  Patient had a UTI 01/04/2022 and was treated with a 7-day course of Keflex, repeat UA 01/09/2022 showed no bacteria.  UA showed large leukocytes with many bacteria today. No other obvious sources of infection, CXR unremarkable and abdominal exam benign. Patient was empirically treated with antibiotics while in the ED receiving cefepime, metronidazole, and vancomycin.  Patient did have oxygen on for comfort while in the room with no documentation of hypoxia.  There is a known cause of tachycardia and tachypnea, making PE unlikely (Well's score 1.5 for tachycardia).  She is appropriately responding to fluid resuscitation.  We will need to de-escalate antibiotics, awaiting urine culture and blood culture results.  Initial GCS of 8,  encephalopathy had resolved when evaluating for admission. Hold home Lasix given volume status.  ?- Await urine and blood culture ?- Continue empiric antibiotics >narrow as able ?-  Fluids as above ?- Monitor for fever ? ?Concussion  ?Appears patient would meet criteria for concussion given trauma to the head and syncopal episode with AMS after event. No evidence of hemorrhage. Continue with neuro checks.  ? ?Left Shoulder Pain after Fall  ?Patient with complaint of left shoulder pain after fall.  Difficult to assess strength and range of motion given patient participation.  No obvious deformities, will proceed with x-ray of left shoulder while treating with analgesics as indicated.  ?- Lidocaine patch for pain  ? ?HLD  ?Last lipid panel 06/2021 LDL 79. Continue home Crestor with updated lipid panel ordered.  ? ?Hypothyroid ?Patient takes Synthroid 150 mcg daily, last TSH 10/22 was 1.920.  Thyroid hormone abnormalities could be contributing to new onset A-fib. ?- Continue Synthroid home dose ?- Updated TSH ordered ? ?HTN  ?Olmesartan 20 mg at home. Hypotensive on admission, holding home medicine.  ? ?Parkinson's Disease  ?Patient follows with neurology outpatient.  Continue Sinemet 25-100 mg 3 tabs at 7 AM, 2 tabs at 11 AM, 2 tabs at 4 PM. ? ?Chronic Lower Back Pain  ?Grade 1 anterolisthesis at L4-L5 seen on lumbar x-ray on 01-09-2022.  Will not proceed with MRI as it will not change management.  Patient would be a very poor surgical candidate and does not seem like surgery would be in her best interest if weighing risk versus benefit. Analgesia as needed.  ?- Lidocaine patch for pain  ?  ?Chronic Constipation  ?Patient reports that she gives self enemas at home and has not had a BM for several days.  ?- Miralax daily, may need senna  ?- May need enema  ? ? ? ?FEN/GI: Heart healthy  ?Prophylaxis: Heparin gtt  ? ?Disposition: Progressive  ? ?History of Present Illness:  Anna Bernard is a 71 y.o. female presenting with recurrent falls. PMHx significant for hypothyroidism, HTN, HLD, T2DM, microcytic anemia on oral iron, vit B12 deficiency, venous insufficiency, Parkinson's, chronic low back pain.   ? ?Patient's first fall happened months ago, sometimes they occur when she's going to the bathroom, sometimes when walking around the room.  ? ?Most recent fall happened early this morning, when going to the bathroom while witnessed by husband. She turned around to get to the toilet. Cannot use her walker in the bathroom. When she started to walk without the walker, she fell. She hit her head on the cabinet during the fall and husband reports that she passed out after the fall and before EMS arrived.  ? ?Patient reports that she got dizzy prior to this fall but has not been dizzy or had LOC prior to this fall.  ? ?Right foot swelling and weakness causing a drag in the foot that is a chronic symptom. She feels this contributes to most of her falls.  ? ?Parkinson's disease, treated with Carbidopa-Levodopa sees Anna Bernard with neurology.   ? ?Usually, she takes Lasix 20 mg.  ? ?She is urinating often but denies dysuria.  ? ?Patient did not take any medications today.  ? ?She does note that her heart seems to have been racing over the last two weeks.   ? ? ?Review Of Systems: Per HPI with the following additions:  ? ?Review of Systems  ?Constitutional:  Positive for activity change. Negative for chills and fever.  ?  HENT:  Positive for postnasal drip. Negative for congestion, ear pain and sore throat.   ?Eyes:  Negative for pain and redness.  ?Respiratory:  Positive for cough and shortness of breath (now resolved \).   ?Cardiovascular:  Positive for chest pain (now resolved).  ?Gastrointestinal:  Positive for constipation (chronic). Negative for abdominal distention, nausea and vomiting.  ?Genitourinary:  Positive for frequency. Negative for difficulty urinating and dysuria.  ?Neurological:  Positive for dizziness (resolved), tremors (parkinsons) and syncope (x1 episode after fall). Negative for seizures, facial asymmetry, speech difficulty, light-headedness, numbness and headaches.  ?Psychiatric/Behavioral:  Negative for  confusion.    ? ?Patient Active Problem List  ? Diagnosis Date Noted  ? Fall 01/16/2022  ? Chronic bilateral low back pain without sciatica 01/14/2022  ? Dyspnea on exertion 09/28/2021  ? Seborrheic keratoses 1

## 2022-01-16 NOTE — ED Provider Notes (Signed)
?[facility] ?Provider Note ? ? ?CSN: [id number] ?Arrival date & time: 01/16/22  0501 ? ?  ? ?History ? ?No chief complaint on file. ? ? ?Anna Bernard is a 71 y.o. female. ? ?The history is provided by the EMS personnel. The history is limited by the condition of the patient.  ?Fall ?This is a new problem. The current episode started less than 1 hour ago. The problem occurs constantly. The problem has been resolved. Pertinent negatives include no chest pain, no abdominal pain and no shortness of breath. Nothing aggravates the symptoms. Nothing relieves the symptoms. She has tried nothing for the symptoms. The treatment provided no relief.  ?Fell from step and hit head, found to be in AFIB and hypotension and also was reportedly warm  to touch with recent UTI.   ?  ? ?Home Medications ?Prior to Admission medications   ?Medication Sig Start Date End Date Taking? Authorizing Provider  ?aspirin 81 MG tablet Take 81 mg by mouth daily.    [provider]  ?Carbidopa-Levodopa ER (SINEMET CR) 25-100 MG tablet controlled release 3 at 7am, 2 at 11 am, 2 at 4pm 07/26/21   Tat, Rebecca S, DO  ?diclofenac Sodium (VOLTAREN) 1 % GEL Apply 4 g topically 4 (four) times daily. 01/14/22   [name], MD  ?ferrous sulfate 325 (65 FE) MG tablet TAKE 1 TABLET BY MOUTH EVERY DAY WITH BREAKFAST 01/04/22   [name], MD  ?furosemide (LASIX) 20 MG tablet TAKE 1 TABLET BY MOUTH TWICE A DAY ?Patient not taking: Reported on 01/14/2022 03/14/20   Jacelyn Pi, Lilia Argue, MD  ?KLOR-CON M20 20 MEQ tablet TAKE 1 TABLET BY MOUTH TWICE A DAY ?Patient not taking: Reported on 01/14/2022 03/14/20   Jacelyn Pi, Lilia Argue, MD  ?levothyroxine (SYNTHROID) 150 MCG tablet Take 1 tablet (150 mcg total) by mouth daily. 12/10/20   Just, Laurita Quint, FNP  ?Multiple Vitamin (MULTIVITAMIN WITH MINERALS) TABS Take 1 tablet by mouth daily.    [provider]  ?naproxen (NAPROSYN) 500 MG tablet Take 1 tablet  (500 mg total) by mouth 2 (two) times daily. 01/04/22   Truddie Hidden, MD  ?olmesartan (BENICAR) 20 MG tablet Take 1 tablet (20 mg total) by mouth daily. 12/10/20   Just, Laurita Quint, FNP  ?oxyCODONE-acetaminophen (PERCOCET/ROXICET) 5-325 MG tablet Take 1 tablet by mouth every 6 (six) hours as needed for severe pain. 01/11/22   Horton, Barbette Hair, MD  ?polyethylene glycol powder (GLYCOLAX/MIRALAX) 17 GM/SCOOP powder Take 17 g by mouth 2 (two) times daily as needed for mild constipation or moderate constipation. 09/10/20   Just, Laurita Quint, FNP  ?predniSONE (DELTASONE) 20 MG tablet Take 2 tablets (40 mg total) by mouth daily with breakfast. For the next four days 01/09/22   Carmin Muskrat, MD  ?rosuvastatin (CRESTOR) 10 MG tablet Take 1 tablet (10 mg total) by mouth daily. 12/10/20   Just, Laurita Quint, FNP  ?   ? ?Allergies    ?Patient has no known allergies.   ? ?Review of Systems   ?Review of Systems  ?HENT:  Negative for congestion.   ?Respiratory:  Negative for shortness of breath.   ?Cardiovascular:  Positive for palpitations. Negative for chest pain.  ?Gastrointestinal:  Negative for abdominal pain.  ?Musculoskeletal:  Negative for neck stiffness.  ?Neurological:  Negative for facial asymmetry.  ?All other systems reviewed and are negative. ? ?Physical Exam ?Updated Vital Signs ?BP 90/70   Temp  97.7 ?F (36.5 ?C) (Rectal)   Resp (!) 39   Ht 5\' 1"  (1.549 m)   Wt 71.8 kg   BMI 29.91 kg/m?  ?Physical Exam ?Vitals and nursing note reviewed. Exam conducted with a chaperone present.  ?Constitutional:   ?   Appearance: She is not diaphoretic.  ?HENT:  ?   Head: Normocephalic and atraumatic.  ?   Right Ear: Tympanic membrane normal.  ?   Left Ear: Tympanic membrane normal.  ?   Nose: Nose normal.  ?Eyes:  ?   Comments: Pinpoint pupils on arrival   ?Cardiovascular:  ?   Rate and Rhythm: Tachycardia present. Rhythm irregular.  ?   Pulses: Normal pulses.  ?   Heart sounds: Normal heart sounds.  ?Pulmonary:  ?   Breath  sounds: Rales present.  ?Abdominal:  ?   General: Bowel sounds are normal.  ?   Palpations: Abdomen is soft.  ?   Tenderness: There is no abdominal tenderness. There is no guarding.  ?Musculoskeletal:  ?   Cervical back: Normal range of motion and neck supple.  ?   Right lower leg: No edema.  ?   Left lower leg: No edema.  ?Skin: ?   General: Skin is warm and dry.  ?   Capillary Refill: Capillary refill takes less than 2 seconds.  ?Neurological:  ?   Deep Tendon Reflexes: Reflexes normal.  ? ? ?ED Results / Procedures / Treatments   ?Labs ?(all labs ordered are listed, but only abnormal results are displayed) ?Results for orders placed or performed during the hospital encounter of 01/16/22  ?Lactic acid, plasma  ?Result Value Ref Range  ? Lactic Acid, Venous 1.7 0.5 - 1.9 mmol/L  ?Comprehensive metabolic panel  ?Result Value Ref Range  ? Sodium 141 135 - 145 mmol/L  ? Potassium 3.9 3.5 - 5.1 mmol/L  ? Chloride 108 98 - 111 mmol/L  ? CO2 28 22 - 32 mmol/L  ? Glucose, Bld 122 (H) 70 - 99 mg/dL  ? BUN 25 (H) 8 - 23 mg/dL  ? Creatinine, Ser 1.08 (H) 0.44 - 1.00 mg/dL  ? Calcium 8.8 (L) 8.9 - 10.3 mg/dL  ? Total Protein 6.4 (L) 6.5 - 8.1 g/dL  ? Albumin 3.3 (L) 3.5 - 5.0 g/dL  ? AST 24 15 - 41 U/L  ? ALT <5 0 - 44 U/L  ? Alkaline Phosphatase 49 38 - 126 U/L  ? Total Bilirubin 0.6 0.3 - 1.2 mg/dL  ? GFR, Estimated 56 (L) >60 mL/min  ? Anion gap 5 5 - 15  ?CBC with Differential  ?Result Value Ref Range  ? WBC 6.5 4.0 - 10.5 K/uL  ? RBC 3.97 3.87 - 5.11 MIL/uL  ? Hemoglobin 9.9 (L) 12.0 - 15.0 g/dL  ? HCT 30.9 (L) 36.0 - 46.0 %  ? MCV 77.8 (L) 80.0 - 100.0 fL  ? MCH 24.9 (L) 26.0 - 34.0 pg  ? MCHC 32.0 30.0 - 36.0 g/dL  ? RDW 15.2 11.5 - 15.5 %  ? Platelets 350 150 - 400 K/uL  ? nRBC 0.0 0.0 - 0.2 %  ? Neutrophils Relative % 60 %  ? Neutro Abs 4.0 1.7 - 7.7 K/uL  ? Lymphocytes Relative 29 %  ? Lymphs Abs 1.9 0.7 - 4.0 K/uL  ? Monocytes Relative 7 %  ? Monocytes Absolute 0.4 0.1 - 1.0 K/uL  ? Eosinophils Relative 3 %  ?  Eosinophils Absolute 0.2 0.0 - 0.5 K/uL  ?  Basophils Relative 1 %  ? Basophils Absolute 0.0 0.0 - 0.1 K/uL  ? Immature Granulocytes 0 %  ? Abs Immature Granulocytes 0.02 0.00 - 0.07 K/uL  ?Protime-INR  ?Result Value Ref Range  ? Prothrombin Time 14.1 11.4 - 15.2 seconds  ? INR 1.1 0.8 - 1.2  ?APTT  ?Result Value Ref Range  ? aPTT 28 24 - 36 seconds  ? ?DG Thoracic Spine 2 View ? ?Result Date: 01/09/2022 ?CLINICAL DATA:  Persistent pain post fall. EXAM: THORACIC SPINE 2 VIEWS COMPARISON:  Vern Guerette 18, 2023 FINDINGS: No definite evidence of fracture. Exaggerated kyphosis. Flowing osteophytes of the thoracic spine, usually associated with inflammatory arthropathy. Calcific atherosclerotic disease of the aorta. IMPRESSION: 1. No definite evidence of fracture. 2. Exaggerated kyphosis. 3. Calcific atherosclerotic disease of the aorta. Electronically Signed   By: Fidela Salisbury M.D.   On: 01/09/2022 19:29  ? ?DG Lumbar Spine Complete ? ?Result Date: 01/09/2022 ?CLINICAL DATA:  Pain post fall. EXAM: LUMBAR SPINE - COMPLETE 4+ VIEW COMPARISON:  Dasha Kawabata 18, 2023 FINDINGS: No evidence of fracture. Grade 1 anterolisthesis at L4-L5. Bilateral flowing osteophytes. Moderate posterior facet arthropathy in the lower lumbosacral spine. Aortic calcifications noted. IMPRESSION: 1. No evidence of fracture. 2. Grade 1 anterolisthesis at L4-L5. 3. Electronically Signed   By: Fidela Salisbury M.D.   On: 01/09/2022 19:28  ? ?DG Lumbar Spine Complete ? ?Result Date: 01/04/2022 ?CLINICAL DATA:  Low back pain.  Fall. EXAM: LUMBAR SPINE - COMPLETE 4+ VIEW COMPARISON:  CT abdomen pelvis dated 09/19/2018. FINDINGS: Five lumbar type vertebra. There is no acute fracture or subluxation a full spine. Osteopenia with multilevel degenerative changes and osteophyte. There is grade 1 L4-L5 anterolisthesis. Bilateral sacroiliitis. Lower lumbar facet arthropathy. The visualized posterior elements are intact. The soft tissues are unremarkable. IMPRESSION:  No acute findings. Electronically Signed   By: Anner Crete M.D.   On: 01/04/2022 02:54  ? ?CT ABDOMEN PELVIS W CONTRAST ? ?Result Date: 01/11/2022 ?CLINICAL DATA:  Abdominal pain EXAM: CT ABDOMEN AND PELVIS WITH CO

## 2022-01-16 NOTE — ED Notes (Signed)
Pt had a BM, cleaned up, new pads under pt. ?

## 2022-01-16 NOTE — Progress Notes (Signed)
ANTICOAGULATION CONSULT NOTE - Follow Up Consult ? ?Pharmacy Consult for Heparin ?Indication: atrial fibrillation ? ?No Known Allergies ? ?Patient Measurements: ?Height: 5\' 1"  (154.9 cm) ?Weight: 71.8 kg (158 lb 4.6 oz) ?IBW/kg (Calculated) : 47.8 ? ? ?Vital Signs: ?Temp: 97.7 ?F (36.5 ?C) (04/30 0515) ?Temp Source: Rectal (04/30 0515) ?BP: 116/87 (04/30 0645) ?Pulse Rate: 57 (04/30 0600) ? ?Labs: ?Recent Labs  ?  01/16/22 ?0522  ?HGB 9.9*  ?HCT 30.9*  ?PLT 350  ?APTT 28  ?LABPROT 14.1  ?INR 1.1  ?CREATININE 1.08*  ?TROPONINIHS 6  ? ? ?Estimated Creatinine Clearance: 44.5 mL/min (A) (by C-G formula based on SCr of 1.08 mg/dL (H)). ? ?Assessment: ?71 yo s/p fall, found to have afib and hypotension. Pharmacy consulted to start heparin for afib. No anticoagulation PTA. ? ?Goal of Therapy:  ?Heparin level 0.3-0.7 units/ml ?Monitor platelets by anticoagulation protocol: Yes ?  ?Plan:  ?Start heparin infusion at 1000 units/hr ?Check anti-Xa level in 6 hours and daily while on heparin ?Continue to monitor H&H and platelets ? ?80, PharmD, FCCM ?Clinical Pharmacist ?Please see AMION for all Pharmacists' Contact Phone Numbers ?01/16/2022, 7:09 AM  ? ? ? ?

## 2022-01-17 ENCOUNTER — Other Ambulatory Visit (HOSPITAL_COMMUNITY): Payer: Self-pay

## 2022-01-17 ENCOUNTER — Observation Stay (HOSPITAL_BASED_OUTPATIENT_CLINIC_OR_DEPARTMENT_OTHER): Payer: Medicare Other

## 2022-01-17 DIAGNOSIS — R079 Chest pain, unspecified: Secondary | ICD-10-CM

## 2022-01-17 DIAGNOSIS — I2699 Other pulmonary embolism without acute cor pulmonale: Secondary | ICD-10-CM | POA: Diagnosis not present

## 2022-01-17 DIAGNOSIS — W19XXXD Unspecified fall, subsequent encounter: Secondary | ICD-10-CM | POA: Diagnosis not present

## 2022-01-17 DIAGNOSIS — R778 Other specified abnormalities of plasma proteins: Secondary | ICD-10-CM | POA: Diagnosis not present

## 2022-01-17 DIAGNOSIS — G20 Parkinson's disease: Secondary | ICD-10-CM

## 2022-01-17 DIAGNOSIS — I214 Non-ST elevation (NSTEMI) myocardial infarction: Secondary | ICD-10-CM

## 2022-01-17 DIAGNOSIS — I480 Paroxysmal atrial fibrillation: Secondary | ICD-10-CM | POA: Diagnosis not present

## 2022-01-17 LAB — LIPID PANEL
Cholesterol: 133 mg/dL (ref 0–200)
HDL: 48 mg/dL (ref >40)
LDL Cholesterol: 72 mg/dL (ref 0–99)
Total CHOL/HDL Ratio: 2.8 RATIO
Triglycerides: 65 mg/dL (ref <150)
VLDL: 13 mg/dL (ref 0–40)

## 2022-01-17 LAB — ECHOCARDIOGRAM COMPLETE
Area-P 1/2: 4.68 cm2
Calc EF: 67.8 %
Height: 61 in
S' Lateral: 2.1 cm
Single Plane A2C EF: 69.1 %
Single Plane A4C EF: 69.1 %
Weight: 2608 oz

## 2022-01-17 LAB — CBC
HCT: 26.5 % — ABNORMAL LOW (ref 36.0–46.0)
Hemoglobin: 9 g/dL — ABNORMAL LOW (ref 12.0–15.0)
MCH: 25.3 pg — ABNORMAL LOW (ref 26.0–34.0)
MCHC: 34 g/dL (ref 30.0–36.0)
MCV: 74.4 fL — ABNORMAL LOW (ref 80.0–100.0)
Platelets: 272 10*3/uL (ref 150–400)
RBC: 3.56 MIL/uL — ABNORMAL LOW (ref 3.87–5.11)
RDW: 15 % (ref 11.5–15.5)
WBC: 7.9 10*3/uL (ref 4.0–10.5)
nRBC: 0 % (ref 0.0–0.2)

## 2022-01-17 LAB — BASIC METABOLIC PANEL
Anion gap: 4 — ABNORMAL LOW (ref 5–15)
BUN: 13 mg/dL (ref 8–23)
CO2: 29 mmol/L (ref 22–32)
Calcium: 8 mg/dL — ABNORMAL LOW (ref 8.9–10.3)
Chloride: 108 mmol/L (ref 98–111)
Creatinine, Ser: 0.59 mg/dL (ref 0.44–1.00)
GFR, Estimated: >60 mL/min (ref >60)
Glucose, Bld: 74 mg/dL (ref 70–99)
Potassium: 3.8 mmol/L (ref 3.5–5.1)
Sodium: 141 mmol/L (ref 135–145)

## 2022-01-17 LAB — HEPARIN LEVEL (UNFRACTIONATED): Heparin Unfractionated: >1.1 IU/mL — ABNORMAL HIGH (ref 0.30–0.70)

## 2022-01-17 LAB — TROPONIN I (HIGH SENSITIVITY): Troponin I (High Sensitivity): 553 ng/L (ref <18)

## 2022-01-17 LAB — TSH: TSH: 6.892 u[IU]/mL — ABNORMAL HIGH (ref 0.350–4.500)

## 2022-01-17 MED ORDER — SODIUM CHLORIDE 0.9 % IV SOLN
1.0000 g | INTRAVENOUS | Status: DC
  Administered 2022-01-17 – 2022-01-18 (×2): 1 g via INTRAVENOUS
  Filled 2022-01-17 (×2): qty 10

## 2022-01-17 MED ORDER — ACETAMINOPHEN 325 MG PO TABS
650.0000 mg | ORAL_TABLET | Freq: Four times a day (QID) | ORAL | Status: DC | PRN
  Administered 2022-01-17 – 2022-01-19 (×4): 650 mg via ORAL
  Filled 2022-01-17 (×4): qty 2

## 2022-01-17 NOTE — Evaluation (Signed)
Occupational Therapy Evaluation ?Patient Details ?Name: Anna Bernard ?MRN: [id number] ?DOB: 06-19-1951 ?Today's Date: 01/17/2022 ? ? ?History of Present Illness 71 y.o. female presenting with frequent falls due to known Parkinson's Disease. Fall 4/30 with LOC and concussion; +afib RVR in ED; shoulder pain with negative xray; +UTI, sepsis; elevated troponin with Cardiology consult feels demand ischemia due to afib RVR  ? ?Clinical Impression ?  ?Pt currently needs max assist +2 for sit to stand, LB dressing, toilet transfers and toileting tasks.  Feel she will benefit from acute care OT to address these limitations in order for her to progress to a min assist level and return home with her spouse.  Feel she will benefit from [facility] as well.   ?   ? ?Recommendations for follow up therapy are one component of a multi-disciplinary discharge planning process, led by the attending physician.  Recommendations may be updated based on patient status, additional functional criteria and insurance authorization.  ? ?Follow Up Recommendations ? Home health OT  ?  ?Assistance Recommended at Discharge Frequent or constant Supervision/Assistance  ?Patient can return home with the following A little help with walking and/or transfers;A little help with bathing/dressing/bathroom;Assistance with cooking/housework;Assist for transportation;Direct supervision/assist for medications management ? ?  ?Functional Status Assessment ? Patient has had a recent decline in their functional status and demonstrates the ability to make significant improvements in function in a reasonable and predictable amount of time.  ?Equipment Recommendations ? None recommended by OT  ?  ?   ?Precautions / Restrictions Precautions ?Precautions: Fall ?Precaution Comments: hx of Parkinsons ?Restrictions ?Weight Bearing Restrictions: No  ? ?  ? ?Mobility Bed Mobility ?Overal bed mobility: Needs Assistance ?Bed Mobility: Rolling, Sidelying to Sit ?Rolling:  Supervision ?Sidelying to sit: Mod assist, HOB elevated ?  ?  ?  ?  ?  ? ?Transfers ?  ?Equipment used: Rolling walker (2 wheels) ?Transfers: Sit to/from Stand ?Sit to Stand: Max assist ?  ?  ?  ?  ?  ?  ?  ? ?  ?Balance Overall balance assessment: Needs assistance ?Sitting-balance support: No upper extremity supported, Feet supported ?Sitting balance-Leahy Scale: Fair ?  ?  ?Standing balance support: Bilateral upper extremity supported ?Standing balance-Leahy Scale: Poor ?Standing balance comment: Posterior LOB with initial sit to stand ?  ?  ?  ?  ?  ?  ?  ?  ?  ?  ?  ?   ? ?ADL either performed or assessed with clinical judgement  ? ?ADL Overall ADL's : Needs assistance/impaired ?Eating/Feeding: Set up;Sitting ?  ?Grooming: Wash/dry hands;Wash/dry face;Set up;Sitting ?  ?Upper Body Bathing: Supervision/ safety;Sitting ?  ?  ?  ?  ?  ?Lower Body Dressing: Maximal assistance;Sit to/from stand ?  ?Toilet Transfer: +2 for safety/equipment;Moderate assistance;BSC/3in1;Ambulation;Rolling walker (2 wheels) ?  ?Toileting- Clothing Manipulation and Hygiene: Maximal assistance;Sit to/from stand ?  ?  ?  ?Functional mobility during ADLs: +2 for physical assistance;Moderate assistance;Rolling walker (2 wheels) ?General ADL Comments: Pt with slow short step length with mobility as well as keeping feet close together with LOB posteriorly and to the left with initial standing.  She exhibited decreased ability to reach either foot for donning her gripper socks from the EOB, which she reports is how she does it at home.  ? ? ? ?Vision Baseline Vision/History: 1 Wears glasses ?Ability to See in Adequate Light: 0 Adequate ?Patient Visual Report: No change from baseline ?Vision Assessment?: No apparent visual deficits  ?   ?  Perception Perception ?Perception: Within Functional Limits ?  ?Praxis Praxis ?Praxis: Intact ?  ? ?Pertinent Vitals/Pain Pain Assessment ?Pain Assessment: No/denies pain ?Faces Pain Scale: Hurts little  more ?Pain Location: Rt toes ?Pain Descriptors / Indicators: Discomfort ?Pain Intervention(s): Monitored during session, Repositioned, Limited activity within patient's tolerance  ? ? ? ?Hand Dominance Right ?  ?Extremity/Trunk Assessment Upper Extremity Assessment ?Upper Extremity Assessment: LUE deficits/detail ?LUE Deficits / Details: AROM shoulder flexion 0-110 degrees with full AAROM available.  Shoulder strength 3+/5 with pt stating history of being sore.  All other joints 4/5 throughout. ?  ?Lower Extremity Assessment ?Lower Extremity Assessment: Defer to PT evaluation ?  ?Cervical / Trunk Assessment ?Cervical / Trunk Assessment: Normal ?Cervical / Trunk Exceptions: overweight ?  ?Communication Communication ?Communication: No difficulties ?  ?Cognition Arousal/Alertness: Awake/alert ?Behavior During Therapy: [facility] for tasks assessed/performed ?Overall Cognitive Status: History of cognitive impairments - at baseline ?  ?  ?  ?  ?  ?  ?  ?  ?  ?  ?  ?  ?  ?  ?  ?  ?General Comments: oriented to person, place, month/day, stated year as "22" instead of 23 ?  ?  ?General Comments  Husband present throughout session. ? ?  ?   ?   ? ? ?Home Living Family/patient expects to be discharged to:: Private residence ?Living Arrangements: Spouse/significant other ?Available Help at Discharge: Family ?Type of Home: House ?Home Access: Stairs to enter ?Entrance Stairs-Number of Steps: 5 ?Entrance Stairs-Rails: Can reach both;Right;Left ?Home Layout: Two level;Full bath on main level ?  ?  ?Bathroom Shower/Tub: Walk-in shower ?  ?Bathroom Toilet: Standard ?Bathroom Accessibility: No ?  ?Home Equipment: Rollator (4 wheels);Rolling Walker (2 wheels);BSC/3in1;Shower seat ?  ?  ?  ? ?  ?Prior Functioning/Environment Prior Level of Function : Independent/Modified Independent ?  ?  ?  ?  ?  ?  ?Mobility Comments: used Rollator for mobility in the house ?ADLs Comments: spouse assisted with getting into the shower ?  ? ?  ?  ?OT  Problem List: Decreased strength;Impaired balance (sitting and/or standing);Decreased knowledge of use of DME or AE ?  ?   ?OT Treatment/Interventions: Self-care/ADL training;Patient/family education;Neuromuscular education;Balance training;Therapeutic activities;DME and/or AE instruction  ?  ?OT Goals(Current goals can be found in the care plan section) Acute Rehab OT Goals ?Patient Stated Goal: To get back to where she was before this admission functionally. ?OT Goal Formulation: With patient/family ?Time For Goal Achievement: 01/31/22 ?Potential to Achieve Goals: Good  ?OT Frequency: Min 2X/week ?  ? ?Co-evaluation PT/OT/SLP Co-Evaluation/Treatment: Yes ?Reason for Co-Treatment: To address functional/ADL transfers;For patient/therapist safety ?PT goals addressed during session: Mobility/safety with mobility;Balance;Proper use of DME ?OT goals addressed during session: ADL's and self-care;Proper use of Adaptive equipment and DME ?  ? ?  ?AM-PAC OT "6 Clicks" Daily Activity     ?Outcome Measure Help from another person eating meals?: None ?Help from another person taking care of personal grooming?: A Little ?Help from another person toileting, which includes using toliet, bedpan, or urinal?: A Lot ?Help from another person bathing (including washing, rinsing, drying)?: A Lot ?Help from another person to put on and taking off regular upper body clothing?: None ?Help from another person to put on and taking off regular lower body clothing?: A Lot ?6 Click Score: 17 ?  ?End of Session Equipment Utilized During Treatment: Rolling walker (2 wheels);Gait belt ?Nurse Communication: Mobility status ? ?Activity Tolerance: Patient tolerated treatment well ?Patient left:  in chair;with call bell/phone within reach ? ?OT Visit Diagnosis: Unsteadiness on feet (R26.81);Muscle weakness (generalized) (M62.81);Repeated falls (R29.6)  ?              ?Time: 8563-1497 ?OT Time Calculation (min): 46 min ?Charges:  OT General  Charges ?$OT Visit: 1 Visit ?OT Evaluation ?$OT Eval Moderate Complexity: 1 Mod ? ?Milca Sytsma OTR/L ?01/17/2022, 1:34 PM ?

## 2022-01-17 NOTE — TOC Initial Note (Signed)
Transition of Care (TOC) - Initial/Assessment Note  ? ? ?Patient Details  ?Name: Anna Bernard ?MRN: [id number] ?Date of Birth: 29-Mar-1951 ? ?Transition of Care (TOC) CM/SW Contact:    ?Harriet Masson, RN ?Phone Number: ?01/17/2022, 3:23 PM ? ?Clinical Narrative:                 ?Spoke to patient regarding transition needs. ?Patient states she lives with her spouse who can transport her to apts and home. Orders for Home Health PT/OT.  Patient defers to [facility] to find highly rated HH agency. Cory with [facility] accepted referral.Patient has a walker at home. ? ?Please send prescriptions to [facility] pharmacy if patient is discharged on Eliquis ?Expected Discharge Plan: Home w Home Health Services ?Barriers to Discharge: Continued Medical Work up ? ? ?Patient Goals and CMS Choice ?Patient states their goals for this hospitalization and ongoing recovery are:: return home ?CMS Medicare.gov Compare Post Acute Care list provided to:: Patient ?Choice offered to / list presented to : Patient ? ?Expected Discharge Plan and Services ?Expected Discharge Plan: Home w Home Health Services ?  ?Discharge Planning Services: CM Consult ?Post Acute Care Choice: Home Health ?Living arrangements for the past 2 months: Single Family Home ?                ?  ?  ?  ?  ?  ?HH Arranged: PT, OT ?HH Agency: [facility] ?Date HH Agency Contacted: 01/17/22 ?Time HH Agency Contacted: 1518 ?Representative spoke with at [facility] Agency: Kandee Keen ? ?Prior Living Arrangements/Services ?Living arrangements for the past 2 months: Single Family Home ?Lives with:: Spouse ?Patient language and need for interpreter reviewed:: Yes ?Do you feel safe going back to the place where you live?: Yes      ?Need for Family Participation in Patient Care: Yes (Comment) ?Care giver support system in place?: Yes (comment) ?Current home services: DME (walker) ?Criminal Activity/Legal Involvement Pertinent to Current Situation/Hospitalization: No - Comment as needed ? ?Activities  of Daily Living ?  ?  ? ?Permission Sought/Granted ?  ?  ?   ?   ?   ?   ? ?Emotional Assessment ?Appearance:: Appears stated age ?Attitude/Demeanor/Rapport: Engaged ?Affect (typically observed): Accepting ?Orientation: : Oriented to Self, Oriented to Place, Oriented to  Time, Oriented to Situation ?Alcohol / Substance Use: Not Applicable ?Psych Involvement: No (comment) ? ?Admission diagnosis:  Paroxysmal atrial fibrillation (HCC) [I48.0] ?Fall [W19.XXXA] ?Fall, initial encounter [W19.XXXA] ?Altered mental status, unspecified altered mental status type [R41.82] ?Sepsis, due to unspecified organism, unspecified whether acute organ dysfunction present (HCC) [A41.9] ?Patient Active Problem List  ? Diagnosis Date Noted  ? Non-ST elevation (NSTEMI) myocardial infarction [facility])   ? Fall 01/16/2022  ? Concussion wth loss of consciousness of 30 minutes or less   ? Paroxysmal atrial fibrillation (HCC)   ? Chronic bilateral low back pain without sciatica 01/14/2022  ? Dyspnea on exertion 09/28/2021  ? Seborrheic keratoses 07/09/2021  ? Parkinson disease (HCC) 04/23/2020  ? Leg swelling 11/26/2019  ? Chronic venous insufficiency 11/26/2019  ? Vitamin B12 deficiency 02/06/2018  ? Sepsis (HCC) 01/29/2018  ? Microcytic anemia 01/29/2018  ? Renal insufficiency 01/29/2018  ? Pure hypercholesterolemia 12/24/2014  ? Diabetes mellitus type 2, controlled, without complications (HCC) 12/24/2014  ? Bruit of right carotid artery 12/24/2014  ? Thyroid disease 07/06/2014  ? Severe obesity (BMI >= 40) (HCC) 07/06/2014  ? Essential hypertension 07/06/2014  ? ?PCP:  Billey Co, MD ?Pharmacy:   ?  [id number] [place], [place] - [address] ?[address] ?[place] [address] ?Phone: [phone] Fax: [phone] ? ? ? ? ?Social Determinants of Health (SDOH) Interventions ?  ? ?Readmission Risk Interventions ?   ? View : No data to display.  ?  ?  ?  ? ? ? ?

## 2022-01-17 NOTE — Discharge Instructions (Addendum)
Dear Anna Bernard, ? ?Thank you for letting us participate in your care. You were hospitalized for a fall and found to have an abnormal heart rhythm called atrial fibrillation or A. Fib, along with low blood pressure.  ? ?POST-HOSPITAL & CARE INSTRUCTIONS ?** ?Go to your follow up appointments (listed below) ? ? ?DOCTOR'S APPOINTMENT   ?Future Appointments  ?Date Time Provider [place]  ?01/18/2022  1:15 PM Hans Eden, OT [facility]  ?01/18/2022  2:00 PM Arliss Journey, PT [facility]  ?01/20/2022  1:15 PM Plaster, Cruzita Lederer, PT [facility]  ?01/20/2022  2:00 PM Rine, Selmer Dominion, OT [facility]  ?01/24/2022  9:30 AM ACCESS TO CARE POOL [facility] [place]  ?01/25/2022  1:15 PM Hans Eden, OT [facility]  ?01/25/2022  2:00 PM Arliss Journey, PT [facility]  ?01/26/2022  9:00 AM Gardiner Barefoot, DPM [facility]  ?02/01/2022  1:15 PM Hans Eden, OT [facility]  ?02/01/2022  2:00 PM Plaster, Cruzita Lederer, PT [facility]  ?02/03/2022  1:15 PM Plaster, Cruzita Lederer, PT [facility]  ?02/03/2022  2:00 PM Rine, Selmer Dominion, OT [facility]  ?02/07/2022 10:05 AM Idolina Primer, Nedra Hai, PA-C [facility]  ?02/08/2022  1:15 PM Hans Eden, OT [facility]  ?02/08/2022  2:00 PM Arliss Journey, PT [facility]  ?02/09/2022  9:45 AM Tat, Eustace Quail, DO [facility] None  ?02/10/2022  1:15 PM Delton Prairie, OT [facility]  ?02/10/2022  2:00 PM Plaster, Cruzita Lederer, PT [facility]  ?02/15/2022  9:50 AM Pray, Norwood Levo, MD [facility] [place]  ?02/15/2022  1:15 PM Hans Eden, OT [facility]  ?02/15/2022  2:00 PM Arliss Journey, PT [facility]  ?02/17/2022  1:15 PM Rine, Selmer Dominion, OT [facility]  ?02/17/2022  2:00 PM Plaster, Cruzita Lederer, PT [facility]  ? ? Follow-up Information   ? ? [place]. Go on 01/24/2022.   ?Why: At 10:30am. You have a hospital follow up appointment with your family medicine clinic on Monday, 5/08 at 10:30am. Please arrive by  10:15am. ?Contact information: ?[address] ?[place] ?[phone] ? ?  ?  ? ? Care, [facility] Follow up.   ?Specialty: Home Health Services ?Why: Home Health PT and OT arranged. They will contact you 1 to 2 days after discharge to schedule apt ?Contact information: ?[address] ?[address] ?[place] [address] ?[phone] ? ? ?  ?  ? ? Charlie Pitter, PA-C Follow up.   ?Specialties: Cardiology, Radiology ?Why: [place] location - cardiology follow-up arranged for you on Monday Feb 07, 2022 at 10:05 AM (Arrive by 9:50 AM). Lisbeth Renshaw is one of the cardiology PAs that works with Dr. Radford Pax. ?Contact information: ?[address] ?[address] ?[place] [address] ?[phone] ? ? ?  ?  ? ?  ?  ? ?  ? ? ?Take care and be well! ? ?Family Medicine Teaching Service Inpatient Team ?[place]  ?[facility]  ?[address] [place], [place] [address] ?[phone] ? ?------------------------------------------------------------------------------------------------------------------ ?Information on my medicine - ELIQUIS? (apixaban) ? ?This medication education was reviewed with me or my healthcare representative as part of my discharge preparation. ? ?Why was Eliquis? prescribed for you? ?Eliquis? was prescribed to treat blood clots that may have been found in the veins of your legs (deep vein thrombosis) or in your lungs (pulmonary embolism) and to  reduce the risk of them occurring again. ? ?What do You need to know about Eliquis? ? ?The starting dose is 10 mg (two 5 mg tablets) taken TWICE daily for the FIRST SEVEN (7) DAYS, then on  01/26/2022 (Wednesday)  the dose is reduced to ONE 5 mg tablet taken TWICE daily.  Eliquis? may be taken with or without food.  ? ?Try to take the dose about the same time in the morning and in the evening. If you have difficulty swallowing the tablet whole please discuss with your pharmacist how to take the  medication safely. ? ?Take Eliquis? exactly as prescribed and DO NOT stop taking Eliquis? without talking to the doctor who prescribed the medication.  Stopping may increase your risk of developing a new blood clot.  Refill your prescription before you run out. ? ?After discharge, you should have regular check-up appointments with your healthcare provider that is prescribing your Eliquis?. ?   ?What do you do if you miss a dose? ?If a dose of ELIQUIS? is not taken at the scheduled time, take it as soon as possible on the same day and twice-daily administration should be resumed. The dose should not be doubled to make up for a missed dose. ? ?Important Safety Information ?A possible side effect of Eliquis? is bleeding. You should call your healthcare provider right away if you experience any of the following: ?Bleeding from an injury or your nose that does not stop. ?Unusual colored urine (red or dark brown) or unusual colored stools (red or black). ?Unusual bruising for unknown reasons. ?A serious fall or if you hit your head (even if there is no bleeding). ? ?Some medicines may interact with Eliquis? and might increase your risk of bleeding or clotting while on Eliquis?Marland Kitchen To help avoid this, consult your healthcare provider or pharmacist prior to using any new prescription or non-prescription medications, including herbals, vitamins, non-steroidal anti-inflammatory drugs (NSAIDs) and supplements. ? ?This website has more information on Eliquis? (apixaban): http://www.eliquis.com/eliquis/home ? ?  ?

## 2022-01-17 NOTE — TOC Benefit Eligibility Note (Signed)
Patient Advocate Encounter ?  ?Insurance verification completed.   ?  ?The patient is currently admitted and upon discharge could be taking APIXABAN 5 MG. ?  ?The current 30 day co-pay is, $47.  ? ?The patient is insured through [employer] D. ? ? ?   ?

## 2022-01-17 NOTE — Progress Notes (Signed)
Family Medicine Teaching Service ?Daily Progress Note ?Intern Pager: [phone] ? ?Patient name: [place] record number: [id number] ?Date of birth: 1951/01/16 Age: 71 y.o. Gender: female ? ?Primary Care Provider: [name], MD ?Consultants: Cardiology ?Code Status: Full ? ?Pt Overview and Major Events to Date:  ?4/30 admitted ?5/1 echo EF 60 to 65% ? ?Assessment and Plan: ? ?Anna Bernard is a 71 year old female who presented with a fall and to onset A-fib with RVR that has spontaneously converted to NSR.  Met sepsis criteria in the ED and was started on antibiotics.  Past medical history significant for hypothyroidism, HTN, HLD T2DM, microcytic anemia on oral iron, vitamin B12 deficiency, venous insufficiency, Parkinson's, chronic lower back pain. ? ?Fall, likely mechanical ?No longer in A-fib RVR.  Has history of Parkinson's disease with recurrent falls.  Blood pressure ranges have been 120-150/70s ?-Fall precautions ?-Repeat CT head if mental status worsens ?-PT/OT(recommending home health PT) ? ?New onset A-fib RVR > spontaneously converted to NSR ?CHA2DS2-VASc of 3.  And would be at high risk for bleeds with Eliquis given progressive nature of parkinson's. Discussed with patient and she has no preference one way or the other. ?-Cardiology following, appreciate recommendations ?-Lopressor 25 mg twice daily ?-Can consider d/c eliquis ? ?Elevated troponin in setting of demand ischemia ?Echo showed EF of 60 to 65%, mild LVH and moderate dilated left atrium.  Cardiology ordered coronary CTA.  Not having any chest pain ?-Appreciate cardiology recommendations ? ?UTI ?Initially met sepsis criteria, no longer septic.  Has remained afebrile, urine culture showed E. coli greater than 100,000 colonies, sensitivities back.  Was given 1 dose of Rocephin yesterday. Denies any dysuria or abdominal pain ?-Fosfomycin x 1 ?-Monitor fever curve ? ?Concussion ?On exam today patient is A&O ?-Monitor ?-Repeat CT head  if mental status worsens ? ?Chronic/stable ?Left shoulder pain s/p fall-shoulder x-ray without acute fracture/dislocation, lidocaine patch ?HLD-home Crestor ?Hypothyroidism-TSH 6.892 in acute setting-continue Synthroid 150 mg daily and outpatient follow-up ?Parkinson's disease-neurology outpatient follow-up, continue home Sinemet ?Chronic lower back pain with grade 1 anterolisthesis at L4 and L5-lidocaine patch for pain ?Chronic constipation-has had bowel movements charted-MiraLAX daily ? ?FEN/GI: Heart healthy ?PPx: Eliquis ?Dispo: HHPT/HHOT possibly today after coronary CT ? ?Subjective:  ?Would like to go home today  ? ?Objective: ?Temp:  [97.9 ?F (36.6 ?C)-99.2 ?F (37.3 ?C)] 97.9 ?F (36.6 ?C) (05/01 1243) ?Pulse Rate:  [71-79] 71 (05/01 1243) ?Resp:  [14-20] 20 (05/01 1243) ?BP: (100-155)/(61-74) 100/61 (05/01 1243) ?SpO2:  [95 %-97 %] 97 % (05/01 1243) ?Weight:  [73.9 kg] 73.9 kg (05/01 0400) ?Physical Exam: ?General: NAD, laying in bed comfortably ?Cardiovascular: RRR no m/r/g ?Respiratory: CTAB no w/r/c ?Abdomen: Nontender to palpation, soft ?Extremities: No lower extremity edema ? ?Laboratory: ?Recent Labs  ?Lab 01/10/22 ?2027 01/16/22 ?0522 01/17/22 ?0234  ?WBC 7.3 6.5 7.9  ?HGB 10.0* 9.9* 9.0*  ?HCT 29.7* 30.9* 26.5*  ?PLT 329 350 272  ? ?Recent Labs  ?Lab 01/10/22 ?2027 01/16/22 ?0522 01/17/22 ?0234  ?NA 137 141 141  ?K 4.1 3.9 3.8  ?CL 103 108 108  ?CO2 _0 ?BUN 28* 25* 13  ?CREATININE 0.93 1.08* 0.59  ?CALCIUM 9.2 8.8* 8.0*  ?PROT 7.3 6.4*  --   ?BILITOT 0.7 0.6  --   ?ALKPHOS 52 49  --   ?ALT 5 <5  --   ?AST 15 24  --   ?GLUCOSE 150* 122* 74  ? ? ? ?Imaging/Diagnostic Tests: ? ? ?Jinny Sanders,  Anna Yaklin, MD ?01/17/2022, 4:41 PM ?PGY-1, [place] ?[facility] Intern pager: [phone], text pages welcome  ?

## 2022-01-17 NOTE — Evaluation (Addendum)
Physical Therapy Evaluation ?Patient Details ?Name: Anna Bernard ?MRN: [id number] ?DOB: 12-31-1950 ?Today's Date: 01/17/2022 ? ?History of Present Illness ? 71 y.o. female presenting with frequent falls due to known Parkinson's Disease. Fall 4/30 with LOC and concussion; +afib RVR in ED; shoulder pain with negative xray; +UTI, sepsis; elevated troponin with Cardiology consult feels demand ischemia due to afib RVR  ?Clinical Impression ?  ?Pt admitted secondary to problem above with deficits below. PTA patient was requiring assist with ADLs and walking into bathroom (as walker does not fit into bathroom). Pt currently requires moderate assist for bed mobility and ambulation and max assist for sit to stand. Patient reports she is not far from her baseline. Anticipate she can return home with husband's excellent care. May need medical transport if does not improve enough to climb 5 steps into home.  Anticipate patient will benefit from PT to address problems listed below.Will continue to follow acutely to maximize functional mobility independence and safety.   ?   ?   ? ?Recommendations for follow up therapy are one component of a multi-disciplinary discharge planning process, led by the attending physician.  Recommendations may be updated based on patient status, additional functional criteria and insurance authorization. ? ?Follow Up Recommendations Home health PT ? ?  ?Assistance Recommended at Discharge Frequent or constant Supervision/Assistance  ?Patient can return home with the following ? A lot of help with walking and/or transfers;A lot of help with bathing/dressing/bathroom;Direct supervision/assist for medications management;Direct supervision/assist for financial management;Assist for transportation;Help with stairs or ramp for entrance ? ?  ?Equipment Recommendations None recommended by PT  ?Recommendations for Other Services ?    ?  ?Functional Status Assessment Patient has had a recent decline in their  functional status and demonstrates the ability to make significant improvements in function in a reasonable and predictable amount of time.  ? ?  ?Precautions / Restrictions Precautions ?Precautions: Fall ?Restrictions ?Weight Bearing Restrictions: No  ? ?  ? ?Mobility ? Bed Mobility ?Overal bed mobility: Needs Assistance ?Bed Mobility: Rolling, Sidelying to Sit ?Rolling: Supervision (wtih rail (which she has at home)) ?Sidelying to sit: Mod assist, HOB elevated ?  ?  ?  ?General bed mobility comments: able to raise her torso to sitting without assist with incr time/effort; could not scoot hip to EOB and required mod assist ?  ? ?Transfers ?Overall transfer level: Needs assistance ?Equipment used: Rolling walker (2 wheels) ?Transfers: Sit to/from Stand ?Sit to Stand: Max assist, +2 safety/equipment ?  ?  ?  ?  ?  ?General transfer comment: from EOB with pt pushing off surface ?  ? ?Ambulation/Gait ?Ambulation/Gait assistance: Mod assist, +2 safety/equipment ?Gait Distance (Feet): 15 Feet (toileted; 25) ?Assistive device: Rolling walker (2 wheels) ?Gait Pattern/deviations: Step-to pattern, Decreased step length - right, Decreased step length - left, Decreased dorsiflexion - right, Decreased dorsiflexion - left, Shuffle, Narrow base of support ?Gait velocity: very slow ?Gait velocity interpretation: <1.8 ft/sec, indicate of risk for recurrent falls ?  ?General Gait Details: able to incr step width with cues, but required repeated cues to maintain; difficulty maneuvering RW with need for assist (in fairness, she is used to using a rollator) ? ?Stairs ?  ?  ?  ?  ?  ? ?Wheelchair Mobility ?  ? ?Modified Rankin (Stroke Patients Only) ?  ? ?  ? ?Balance Overall balance assessment: Needs assistance ?Sitting-balance support: No upper extremity supported, Feet supported ?Sitting balance-Leahy Scale: Good ?Sitting balance - Comments: able to  reach down towards rt foot and return to upright ?  ?Standing balance support:  Bilateral upper extremity supported ?Standing balance-Leahy Scale: Poor ?Standing balance comment: initial posterior and rt lean on standing; improved with ambulation ?  ?  ?  ?  ?  ?  ?  ?  ?  ?  ?  ?   ? ? ? ?Pertinent Vitals/Pain Pain Assessment ?Pain Assessment: Faces ?Faces Pain Scale: Hurts little more ?Pain Location: Rt toes ?Pain Descriptors / Indicators: Discomfort, Nagging ?Pain Intervention(s): Limited activity within patient's tolerance, Monitored during session, Other (comment) (husband to bring her shoes in)  ? ? ?Home Living Family/patient expects to be discharged to:: Private residence ?Living Arrangements: Spouse/significant other ?Available Help at Discharge: Family ?Type of Home: House ?Home Access: Stairs to enter ?Entrance Stairs-Rails: Can reach both;Right;Left ?Entrance Stairs-Number of Steps: 5 ?  ?Home Layout: Two level;Full bath on main level ?Home Equipment: Rollator (4 wheels);Rolling Walker (2 wheels);BSC/3in1;Shower seat ?   ?  ?Prior Function Prior Level of Function : Independent/Modified Independent ?  ?  ?  ?  ?  ?  ?Mobility Comments: used Rollator for mobility in the house ?ADLs Comments: spouse assisted with getting into the shower ?  ? ? ?Hand Dominance  ? Dominant Hand: Right ? ?  ?Extremity/Trunk Assessment  ? Upper Extremity Assessment ?Upper Extremity Assessment: Defer to OT evaluation ?  ? ?Lower Extremity Assessment ?Lower Extremity Assessment: Generalized weakness (symmetric weakness; knee extension 4/5; noted hammer toes on rt foot) ?  ? ?Cervical / Trunk Assessment ?Cervical / Trunk Assessment: Other exceptions ?Cervical / Trunk Exceptions: overweight  ?Communication  ? Communication: No difficulties  ?Cognition Arousal/Alertness: Awake/alert ?Behavior During Therapy: [facility] for tasks assessed/performed ?Overall Cognitive Status: History of cognitive impairments - at baseline ?  ?  ?  ?  ?  ?  ?  ?  ?  ?  ?  ?  ?  ?  ?  ?  ?General Comments: oriented to person, place,  month/day (not year) ?  ?  ? ?  ?General Comments General comments (skin integrity, edema, etc.): Husband present throughout session. ? ?  ?Exercises    ? ?Assessment/Plan  ?  ?PT Assessment Patient needs continued PT services  ?PT Problem List Decreased strength;Decreased balance;Decreased mobility;Decreased coordination;Decreased cognition;Decreased knowledge of use of DME;Impaired tone;Obesity;Pain ? ?   ?  ?PT Treatment Interventions DME instruction;Gait training;Stair training;Functional mobility training;Therapeutic activities;Therapeutic exercise;Balance training;Neuromuscular re-education;Cognitive remediation;Patient/family education   ? ?PT Goals (Current goals can be found in the Care Plan section)  ?Acute Rehab PT Goals ?Patient Stated Goal: return home ASAP ?PT Goal Formulation: With patient/family ?Time For Goal Achievement: 01/31/22 ?Potential to Achieve Goals: Good ? ?  ?Frequency Min 3X/week ?  ? ? ?Co-evaluation PT/OT/SLP Co-Evaluation/Treatment: Yes ?Reason for Co-Treatment: Complexity of the patient's impairments (multi-system involvement);For patient/therapist safety;To address functional/ADL transfers ?PT goals addressed during session: Mobility/safety with mobility;Balance;Proper use of DME ?  ?  ? ? ?  ?AM-PAC PT "6 Clicks" Mobility  ?Outcome Measure Help needed turning from your back to your side while in a flat bed without using bedrails?: A Little ?Help needed moving from lying on your back to sitting on the side of a flat bed without using bedrails?: A Lot ?Help needed moving to and from a bed to a chair (including a wheelchair)?: A Lot ?Help needed standing up from a chair using your arms (e.g., wheelchair or bedside chair)?: A Lot ?Help needed to walk in hospital room?:  A Lot ?Help needed climbing 3-5 steps with a railing? : A Lot ?6 Click Score: 13 ? ?  ?End of Session Equipment Utilized During Treatment: Gait belt ?Activity Tolerance: Patient tolerated treatment well ?Patient left: in  chair;with call bell/phone within reach;with chair alarm set;with family/visitor present ?Nurse Communication: Mobility status ?PT Visit Diagnosis: Repeated falls (R29.6);Difficulty in walking, not elsewhere classi

## 2022-01-17 NOTE — Progress Notes (Signed)
Family Medicine Teaching Service ?Daily Progress Note ?Intern Pager: [phone] ? ?Patient name: [name] record number: [id number] ?Date of birth: 1950/10/04 Age: 71 y.o. Gender: female ? ?Primary Care Provider: [name], MD ?Consultants: Cardiology ?Code Status: Full ? ?Pt Overview and Major Events to Date:  ?4/30 admitted ? ?Assessment and Plan: ? ?Anna Bernard is a 71 yo female who presented with a fall and new Afib with RVR that has spontaneously converted to NSR. Met sepsis criteria in the ED with and was started on abx. PMH significant for hypothyroidism, HTN, HLD, T2DM, microcytic anemia on oral iron, Vit B12 deficiency, venous insufficiency, Parkinson's, chronic lower back pain. ? ?Fall, likely mechanical ?No longer in Afib RVR. Has hx of Parkinson's disease with recurrent falls.  Denies any dizziness. Fluids have been stopped, BP has been more appropriate around 128-140s/60s-70s. ?-fall precautions ?-neuro checks q4h ?-Repeat CT Head if mental status worsens ?-orthostatic vitals  ?-PT/OT ? ?New Onset Afib RVR > spontaneously converted to NSR ?CHADS2VASC of 3. TSH mildly elevated at 6.892. Eliquis was started yesterday however has hx of recurrent falls would be high risk for bleeds.  ?-Cardiology following, appreciate recs ?-lopressor 25 mg BID ?-eliquis, will await PT evaluation ?-PT to assess for fall risk ? ?Elevated Troponins ?6>553. Likely demand ischemia ins etting of rapid Afib. No longer has chest pain. ?-Echo  ? ?UTI with sepsis criteria met (no longer septic) ?No longer meets sepsis criteria. Has remained afebrile.  Denies any dysuria or abdominal pain.  Blood and urine culture pending ?-F/u cultures ?-Metronidazole, vanc, cefepime d/c ?-Rocephin 1 g today x 1, consider keflex tomorrow ?-Monitor fever curve ? ?Concussion ?On exam today patient is A&O x 4. ?-Monitor ?-Repeat head CT if mental status woorsens ?-Neuro checks ? ?Left Shoulder Pain s/p Fall ?Shoulder XR showed no  acute fracture/dislocations ?-Lidocaine patch for pain ?-PT ? ?HLD ?-home crestor ? ?Hypothyroidism ?TSH mildly elevated to 6.892 although in an acute setting. ?-synthroid 150 mg daily ?-outpatient f/u ? ?HTN ?BP originally hypotensive, now more appropriate around 128-140s/60s-70s. ?-Monitor Bp ?-Irbesartan 150 mg daily ?-lopressor started per cardiology for afib ? ?Parkinson's Disease ?-Neurology outpatient f/u ?-home sinemet 25-100 mg 3 tabs at 7 am, 2 tabs at 11 am, 2 tabs a 4 pm. ?-Fall precautions ? ?Chronic Lower Back Pain ?Grade 1 anterolisthesis at L4-L5.  ?-Lidocaine patch for pain ? ?Chronic constipation ?Abdomen soft, 1 charted BM ?-Miralax daily ? ?FEN/GI: Heart Healthy ?PPx: Eliquis  ?Dispo:pending PT recs ? ?Subjective:  ?No complaints this morning no chest pain or dysuria ? ?Objective: ?Temp:  [98.2 ?F (36.8 ?C)-99.2 ?F (37.3 ?C)] 99.2 ?F (37.3 ?C) (05/01 0400) ?Pulse Rate:  [31-140] 73 (05/01 0400) ?Resp:  [9-58] 14 (05/01 0400) ?BP: (92-155)/(58-87) 128/65 (05/01 0400) ?SpO2:  [95 %-100 %] 95 % (05/01 0400) ?Weight:  [73.9 kg] 73.9 kg (05/01 0400) ?Physical Exam: ?General: NAD, laying in bed comfortably, alert and responsive ?Cardiovascular: RRR no murmurs rubs or gallops ?Respiratory: Clear to auscultation bilaterally no wheezes rales or crackles ?Abdomen: Nontender to palpation, soft ?Extremities: No lower extremity edema ? ?Laboratory: ?Recent Labs  ?Lab 01/10/22 ?2027 01/16/22 ?0522 01/17/22 ?0234  ?WBC 7.3 6.5 7.9  ?HGB 10.0* 9.9* 9.0*  ?HCT 29.7* 30.9* 26.5*  ?PLT 329 350 272  ? ?Recent Labs  ?Lab 01/10/22 ?2027 01/16/22 ?0522 01/17/22 ?0234  ?NA 137 141 141  ?K 4.1 3.9 3.8  ?CL 103 108 108  ?CO2 27 28 29   ?BUN 28* 25* 13  ?  CREATININE 0.93 1.08* 0.59  ?CALCIUM 9.2 8.8* 8.0*  ?PROT 7.3 6.4*  --   ?BILITOT 0.7 0.6  --   ?ALKPHOS 52 49  --   ?ALT 5 <5  --   ?AST 15 24  --   ?GLUCOSE 150* 122* 74  ? ? ? ?Imaging/Diagnostic Tests: ?DG Humerus Left ? ?Result Date: 01/16/2022 ?CLINICAL DATA:   71 year old female status post fall. EXAM: LEFT HUMERUS - 2+ VIEW COMPARISON:  Chest radiographs 0518 hours today. FINDINGS: Degenerative spurring at the left shoulder. Left humerus appears intact with grossly preserved alignment at the left shoulder and elbow. Degenerative spurring at the elbow. Stable visible left chest, no left rib fracture identified. Negative visible bowel gas. IMPRESSION: No acute fracture or dislocation identified about the left humerus. Electronically Signed   By: Genevie Ann M.D.   On: 01/16/2022 11:04    ? ?Gerrit Heck, MD ?01/17/2022, 5:33 AM ?PGY-1, [place] [facility] ?[facility] Intern pager: [phone], text pages welcome  ?

## 2022-01-17 NOTE — Progress Notes (Signed)
? ?Progress Note ? ?Patient Name: Anna Bernard ?Date of Encounter: 01/17/2022 ? ?[facility] Cardiologist: Fransico Him, MD  ? ?Subjective  ? ?No chest pain or dyspnea.  ? ?Inpatient Medications  ?  ?Scheduled Meds: ? apixaban  5 mg Oral BID  ? Carbidopa-Levodopa ER  2 tablet Oral BID  ? And  ? Carbidopa-Levodopa ER  3 tablet Oral Q24H  ? irbesartan  150 mg Oral Daily  ? levothyroxine  150 mcg Oral Daily  ? lidocaine  1 patch Transdermal Q24H  ? metoprolol tartrate  25 mg Oral BID  ? rosuvastatin  10 mg Oral Daily  ? ?Continuous Infusions: ? ?PRN Meds: ?polyethylene glycol  ? ?Vital Signs  ?  ?Vitals:  ? 01/16/22 1930 01/17/22 0031 01/17/22 0400 01/17/22 [id number]  ?BP: 128/65 (!) 148/71 128/65 (!) 155/74  ?Pulse: 77 74 73 79  ?Resp: 15 17 14 16   ?Temp: 98.8 ?F (37.1 ?C) 98.7 ?F (37.1 ?C) 99.2 ?F (37.3 ?C) 98.5 ?F (36.9 ?C)  ?TempSrc: Oral Oral Oral Oral  ?SpO2: 95% 96% 95% 96%  ?Weight:   73.9 kg   ?Height:      ? ? ?Intake/Output Summary (Last 24 hours) at 01/17/2022 1052 ?Last data filed at 01/17/2022 0700 ?Gross per 24 hour  ?Intake 3669.56 ml  ?Output 1575 ml  ?Net 2094.56 ml  ? ? ?  01/17/2022  ?  4:00 AM 01/16/2022  ?  5:06 AM 01/14/2022  ?  9:43 AM  ?Last 3 Weights  ?Weight (lbs) 163 lb 158 lb 4.6 oz 158 lb 3.2 oz  ?Weight (kg) 73.936 kg 71.8 kg 71.759 kg  ?   ? ?Telemetry  ?  ?Sinus rhythm - Personally Reviewed ? ?ECG  ?  ?N/A ? ?Physical Exam  ? ?GEN: No acute distress.   ?Neck: No JVD ?Cardiac: RRR, no murmurs, rubs, or gallops.  ?Respiratory: Clear to auscultation bilaterally. ?GI: Soft, nontender, non-distended  ?MS: No edema; No deformity. ?Neuro:  Nonfocal  ?Psych: Normal affect  ? ?Labs  ?  ?High Sensitivity Troponin:   ?Recent Labs  ?Lab 01/16/22 ?0522 01/16/22 ?[id number] 01/16/22 ?1448 01/16/22 ?1632  ?TROPONINIHS 6 52* 553* 551*  ?   ?Chemistry ?Recent Labs  ?Lab 01/10/22 ?2027 01/16/22 ?0522 01/17/22 ?0234  ?NA 137 141 141  ?K 4.1 3.9 3.8  ?CL 103 108 108  ?CO2 27 28 29   ?GLUCOSE 150* 122* 74  ?BUN 28* 25* 13   ?CREATININE 0.93 1.08* 0.59  ?CALCIUM 9.2 8.8* 8.0*  ?PROT 7.3 6.4*  --   ?ALBUMIN 3.7 3.3*  --   ?AST 15 24  --   ?ALT 5 <5  --   ?ALKPHOS 52 49  --   ?BILITOT 0.7 0.6  --   ?GFRNONAA >60 56* >60  ?ANIONGAP 7 5 4*  ?  ?Lipids  ?Recent Labs  ?Lab 01/17/22 ?0234  ?CHOL 133  ?TRIG 65  ?HDL 48  ?[place] 72  ?CHOLHDL 2.8  ?  ?Hematology ?Recent Labs  ?Lab 01/10/22 ?2027 01/16/22 ?0522 01/17/22 ?0234  ?WBC 7.3 6.5 7.9  ?RBC 3.96 3.97 3.56*  ?HGB 10.0* 9.9* 9.0*  ?HCT 29.7* 30.9* 26.5*  ?MCV 75.0* 77.8* 74.4*  ?MCH 25.3* 24.9* 25.3*  ?MCHC 33.7 32.0 34.0  ?RDW 14.9 15.2 15.0  ?PLT 329 350 272  ? ?Thyroid  ?Recent Labs  ?Lab 01/17/22 ?0234  ?TSH 6.892*  ?  ?BNPNo results for input(s): BNP, PROBNP in the last 168 hours.  ?DDimer No results for input(s): DDIMER  in the last 168 hours.  ? ?Radiology  ?  ?CT Head Wo Contrast ? ?Result Date: 01/16/2022 ?CLINICAL DATA:  Fall with possible loss of consciousness. EXAM: CT HEAD WITHOUT CONTRAST TECHNIQUE: Contiguous axial images were obtained from the base of the skull through the vertex without intravenous contrast. RADIATION DOSE REDUCTION: This exam was performed according to the departmental dose-optimization program which includes automated exposure control, adjustment of the mA and/or kV according to patient size and/or use of iterative reconstruction technique. COMPARISON:  MRI head 08/15/2020. FINDINGS: Brain: There is no evidence for acute hemorrhage, hydrocephalus, mass lesion, or abnormal extra-axial fluid collection. No definite CT evidence for acute infarction. Diffuse loss of parenchymal volume is consistent with atrophy. Patchy low attenuation in the deep hemispheric and periventricular white matter is nonspecific, but likely reflects chronic microvascular ischemic demyelination. Vascular: No hyperdense vessel or unexpected calcification. Skull: No evidence for fracture. No worrisome lytic or sclerotic lesion. Sinuses/Orbits: The visualized paranasal sinuses and  mastoid air cells are clear. Visualized portions of the globes and intraorbital fat are unremarkable. Other: None. IMPRESSION: 1. No acute intracranial abnormality. 2. Atrophy with chronic small vessel ischemic disease. Electronically Signed   By: Misty Stanley M.D.   On: 01/16/2022 06:45  ? ?DG Chest Port 1 View ? ?Result Date: 01/16/2022 ?CLINICAL DATA:  Sepsis. EXAM: PORTABLE CHEST 1 VIEW COMPARISON:  01/29/2018 FINDINGS: The lungs are clear without focal pneumonia, edema, pneumothorax or pleural effusion. The cardiopericardial silhouette is within normal limits for size. The visualized bony structures of the thorax are unremarkable. Telemetry leads overlie the chest. IMPRESSION: No active disease. Electronically Signed   By: Misty Stanley M.D.   On: 01/16/2022 06:43  ? ?DG Humerus Left ? ?Result Date: 01/16/2022 ?CLINICAL DATA:  71 year old female status post fall. EXAM: LEFT HUMERUS - 2+ VIEW COMPARISON:  Chest radiographs 0518 hours today. FINDINGS: Degenerative spurring at the left shoulder. Left humerus appears intact with grossly preserved alignment at the left shoulder and elbow. Degenerative spurring at the elbow. Stable visible left chest, no left rib fracture identified. Negative visible bowel gas. IMPRESSION: No acute fracture or dislocation identified about the left humerus. Electronically Signed   By: Genevie Ann M.D.   On: 01/16/2022 11:04   ? ?Cardiac Studies  ? ?Pending reading of echo ? ?Patient Profile  ?   ?71 y.o. female with a hx of hyperlipidemia, hypertension, prediabetes, Parkinson's disease, vitamin B12 deficiency, microcytic anemia, chronic low back pain and multiple falls admitted for falls/dizziness (felt less likely syncope) in setting of sepsis 2nd to UTI. Cardiology consulted for new afib RVR.  ? ?Assessment & Plan  ?  ?New onset atrial fibrillation with RVR ?- Unknown duration. Given amiodarone with conversation to sinus rhythm. No recurrence.  ?- TSH is elevated >> on thyroid supplement  at home, adjustment per primary team  ?- Echo done, pending reading ?-CHADS2VASC score of 3 ?- Started on Eliquis 5mg  BID per Family Medicine, DR. Turner recommended PT evaluation for risk assessment (consider inpatient evaluation).  ?- Continue BB ? ?2. Elevated troponin  ?- Hs-troponin 6>>52>>553 ?- Reported chest pressure on admission  ?- CP resolved after conversion to NSR ?- Pending echo ?- Continue BB ? ?3. HTN ?- Continue BB and ARB, titrate further ? ?4. HLD ?- Continue statin  ?- 01/17/2022: Cholesterol 133; HDL 48; LDL Cholesterol 72; Triglycerides 65; VLDL 13  ? ? ?Dr. Oval Linsey to see ? ?For questions or updates, please contact [place] ?Please consult [email or web address] for  contact info under  ? ?  ?   ?Signed, ?Leanor Kail, PA  ?01/17/2022, 10:52 AM    ?

## 2022-01-17 NOTE — Telephone Encounter (Signed)
[facility]  Anna Bernard,CMA ? ? ?

## 2022-01-17 NOTE — Progress Notes (Signed)
?  Echocardiogram ?2D Echocardiogram has been performed. ? ?Janalyn Harder ?01/17/2022, 9:19 AM ?

## 2022-01-17 NOTE — Care Management Obs Status (Signed)
MEDICARE OBSERVATION STATUS NOTIFICATION ? ? ?Patient Details  ?Name: Anna Bernard ?MRN: [id number] ?Date of Birth: 03/06/1951 ? ? ?Medicare Observation Status Notification Given:    ? ? ? ?Harriet Masson, RN ?01/17/2022, 2:04 PM ?

## 2022-01-17 NOTE — Progress Notes (Signed)
?  Transition of Care (TOC) Screening Note ? ? ?Patient Details  ?Name: Anna Bernard ?Date of Birth: 06-16-1951 ? ? ?Transition of Care (TOC) CM/SW Contact:    ?Harriet Masson, RN ?Phone Number: ?01/17/2022, 9:53 AM ? ? ? ?Transition of Care Department [facility]) has reviewed patient and no TOC needs have been identified at this time. We will continue to monitor patient advancement through interdisciplinary progression rounds. If new patient transition needs arise, please place a TOC consult. ? ? ?

## 2022-01-18 ENCOUNTER — Ambulatory Visit: Payer: Medicare Other | Admitting: Occupational Therapy

## 2022-01-18 ENCOUNTER — Observation Stay (HOSPITAL_COMMUNITY): Payer: Medicare Other

## 2022-01-18 ENCOUNTER — Ambulatory Visit: Payer: Medicare Other | Admitting: Physical Therapy

## 2022-01-18 DIAGNOSIS — M545 Low back pain, unspecified: Secondary | ICD-10-CM | POA: Diagnosis not present

## 2022-01-18 DIAGNOSIS — R0609 Other forms of dyspnea: Secondary | ICD-10-CM | POA: Diagnosis not present

## 2022-01-18 DIAGNOSIS — E039 Hypothyroidism, unspecified: Secondary | ICD-10-CM | POA: Diagnosis present

## 2022-01-18 DIAGNOSIS — G20 Parkinson's disease: Secondary | ICD-10-CM | POA: Diagnosis not present

## 2022-01-18 DIAGNOSIS — I2510 Atherosclerotic heart disease of native coronary artery without angina pectoris: Secondary | ICD-10-CM | POA: Diagnosis not present

## 2022-01-18 DIAGNOSIS — I700 Atherosclerosis of aorta: Secondary | ICD-10-CM | POA: Diagnosis present

## 2022-01-18 DIAGNOSIS — R402252 Coma scale, best verbal response, oriented, at arrival to emergency department: Secondary | ICD-10-CM | POA: Diagnosis present

## 2022-01-18 DIAGNOSIS — R402362 Coma scale, best motor response, obeys commands, at arrival to emergency department: Secondary | ICD-10-CM | POA: Diagnosis present

## 2022-01-18 DIAGNOSIS — Z9181 History of falling: Secondary | ICD-10-CM | POA: Diagnosis not present

## 2022-01-18 DIAGNOSIS — I2699 Other pulmonary embolism without acute cor pulmonale: Secondary | ICD-10-CM | POA: Diagnosis not present

## 2022-01-18 DIAGNOSIS — E7800 Pure hypercholesterolemia, unspecified: Secondary | ICD-10-CM | POA: Diagnosis not present

## 2022-01-18 DIAGNOSIS — W01190A Fall on same level from slipping, tripping and stumbling with subsequent striking against furniture, initial encounter: Secondary | ICD-10-CM | POA: Diagnosis present

## 2022-01-18 DIAGNOSIS — I10 Essential (primary) hypertension: Secondary | ICD-10-CM | POA: Diagnosis not present

## 2022-01-18 DIAGNOSIS — N179 Acute kidney failure, unspecified: Secondary | ICD-10-CM | POA: Diagnosis present

## 2022-01-18 DIAGNOSIS — R402142 Coma scale, eyes open, spontaneous, at arrival to emergency department: Secondary | ICD-10-CM | POA: Diagnosis present

## 2022-01-18 DIAGNOSIS — R404 Transient alteration of awareness: Secondary | ICD-10-CM | POA: Diagnosis not present

## 2022-01-18 DIAGNOSIS — B9620 Unspecified Escherichia coli [E. coli] as the cause of diseases classified elsewhere: Secondary | ICD-10-CM | POA: Diagnosis present

## 2022-01-18 DIAGNOSIS — I872 Venous insufficiency (chronic) (peripheral): Secondary | ICD-10-CM | POA: Diagnosis not present

## 2022-01-18 DIAGNOSIS — I493 Ventricular premature depolarization: Secondary | ICD-10-CM | POA: Diagnosis present

## 2022-01-18 DIAGNOSIS — E079 Disorder of thyroid, unspecified: Secondary | ICD-10-CM | POA: Diagnosis not present

## 2022-01-18 DIAGNOSIS — W19XXXD Unspecified fall, subsequent encounter: Secondary | ICD-10-CM | POA: Diagnosis not present

## 2022-01-18 DIAGNOSIS — S060XAA Concussion with loss of consciousness status unknown, initial encounter: Secondary | ICD-10-CM | POA: Diagnosis present

## 2022-01-18 DIAGNOSIS — M2042 Other hammer toe(s) (acquired), left foot: Secondary | ICD-10-CM | POA: Diagnosis present

## 2022-01-18 DIAGNOSIS — Z20822 Contact with and (suspected) exposure to covid-19: Secondary | ICD-10-CM | POA: Diagnosis present

## 2022-01-18 DIAGNOSIS — N390 Urinary tract infection, site not specified: Secondary | ICD-10-CM | POA: Diagnosis present

## 2022-01-18 DIAGNOSIS — R4182 Altered mental status, unspecified: Secondary | ICD-10-CM | POA: Diagnosis not present

## 2022-01-18 DIAGNOSIS — D509 Iron deficiency anemia, unspecified: Secondary | ICD-10-CM | POA: Diagnosis not present

## 2022-01-18 DIAGNOSIS — K5909 Other constipation: Secondary | ICD-10-CM | POA: Diagnosis present

## 2022-01-18 DIAGNOSIS — Z7401 Bed confinement status: Secondary | ICD-10-CM | POA: Diagnosis not present

## 2022-01-18 DIAGNOSIS — I214 Non-ST elevation (NSTEMI) myocardial infarction: Secondary | ICD-10-CM | POA: Diagnosis not present

## 2022-01-18 DIAGNOSIS — Y92009 Unspecified place in unspecified non-institutional (private) residence as the place of occurrence of the external cause: Secondary | ICD-10-CM | POA: Diagnosis not present

## 2022-01-18 DIAGNOSIS — G8929 Other chronic pain: Secondary | ICD-10-CM | POA: Diagnosis present

## 2022-01-18 DIAGNOSIS — Y9301 Activity, walking, marching and hiking: Secondary | ICD-10-CM | POA: Diagnosis present

## 2022-01-18 DIAGNOSIS — I480 Paroxysmal atrial fibrillation: Secondary | ICD-10-CM | POA: Diagnosis not present

## 2022-01-18 DIAGNOSIS — E119 Type 2 diabetes mellitus without complications: Secondary | ICD-10-CM | POA: Diagnosis not present

## 2022-01-18 LAB — URINE CULTURE
Culture: >=100000 — AB
Special Requests: NORMAL

## 2022-01-18 LAB — BASIC METABOLIC PANEL
Anion gap: 7 (ref 5–15)
BUN: 10 mg/dL (ref 8–23)
CO2: 27 mmol/L (ref 22–32)
Calcium: 8.1 mg/dL — ABNORMAL LOW (ref 8.9–10.3)
Chloride: 107 mmol/L (ref 98–111)
Creatinine, Ser: 0.67 mg/dL (ref 0.44–1.00)
GFR, Estimated: >60 mL/min (ref >60)
Glucose, Bld: 89 mg/dL (ref 70–99)
Potassium: 3.6 mmol/L (ref 3.5–5.1)
Sodium: 141 mmol/L (ref 135–145)

## 2022-01-18 LAB — CBC
HCT: 28.2 % — ABNORMAL LOW (ref 36.0–46.0)
Hemoglobin: 9.6 g/dL — ABNORMAL LOW (ref 12.0–15.0)
MCH: 25 pg — ABNORMAL LOW (ref 26.0–34.0)
MCHC: 34 g/dL (ref 30.0–36.0)
MCV: 73.4 fL — ABNORMAL LOW (ref 80.0–100.0)
Platelets: 295 10*3/uL (ref 150–400)
RBC: 3.84 MIL/uL — ABNORMAL LOW (ref 3.87–5.11)
RDW: 14.8 % (ref 11.5–15.5)
WBC: 6.2 10*3/uL (ref 4.0–10.5)
nRBC: 0 % (ref 0.0–0.2)

## 2022-01-18 MED ORDER — IRBESARTAN 300 MG PO TABS
150.0000 mg | ORAL_TABLET | Freq: Every day | ORAL | Status: DC
Start: 2022-01-19 — End: 2022-01-20
  Administered 2022-01-19: 150 mg via ORAL
  Filled 2022-01-18: qty 1

## 2022-01-18 MED ORDER — IOHEXOL 350 MG/ML SOLN
100.0000 mL | Freq: Once | INTRAVENOUS | Status: AC | PRN
  Administered 2022-01-18: 100 mL via INTRAVENOUS

## 2022-01-18 MED ORDER — NITROGLYCERIN 0.4 MG SL SUBL
SUBLINGUAL_TABLET | SUBLINGUAL | Status: AC
Start: 2022-01-18 — End: 2022-01-19
  Filled 2022-01-18: qty 2

## 2022-01-18 MED ORDER — METOPROLOL TARTRATE 5 MG/5ML IV SOLN
INTRAVENOUS | Status: AC
Start: 2022-01-18 — End: 2022-01-19
  Filled 2022-01-18: qty 5

## 2022-01-18 MED ORDER — FOSFOMYCIN TROMETHAMINE 3 G PO PACK
3.0000 g | PACK | Freq: Once | ORAL | Status: AC
  Administered 2022-01-18: 3 g via ORAL
  Filled 2022-01-18: qty 3

## 2022-01-18 NOTE — Progress Notes (Addendum)
Progress Note  Patient Name: Anna Bernard Date of Encounter: 01/18/2022  Primary Cardiologist: Fransico Him, MD  Subjective   No cardiac complaints this AM, just wants to go home. Still feeling somewhat weak. No dizziness, CP, SOB. Last BP 115 systolic.  Inpatient Medications    Scheduled Meds:  apixaban  5 mg Oral BID   Carbidopa-Levodopa ER  2 tablet Oral BID   And   Carbidopa-Levodopa ER  3 tablet Oral Q24H   irbesartan  150 mg Oral Daily   levothyroxine  150 mcg Oral Daily   lidocaine  1 patch Transdermal Q24H   metoprolol tartrate  25 mg Oral BID   rosuvastatin  10 mg Oral Daily   Continuous Infusions:  cefTRIAXone (ROCEPHIN)  IV Stopped (01/17/22 1233)   PRN Meds: acetaminophen, polyethylene glycol   Vital Signs    Vitals:   01/18/22 0351 01/18/22 0400 01/18/22 0500 01/18/22 0748  BP: 124/79 137/70  123/68  Pulse: 74 72  78  Resp: 18 17  (!) 23  Temp:    98 F (36.7 C)  TempSrc:    Oral  SpO2: 99% 97%  98%  Weight:   73.7 kg   Height:        Intake/Output Summary (Last 24 hours) at 01/18/2022 0802 Last data filed at 01/18/2022 0612 Gross per 24 hour  Intake 580 ml  Output 2400 ml  Net -1820 ml      01/18/2022    5:00 AM 01/17/2022    4:00 AM 01/16/2022    5:06 AM  Last 3 Weights  Weight (lbs) 162 lb 7.7 oz 163 lb 158 lb 4.6 oz  Weight (kg) 73.7 kg 73.936 kg 71.8 kg     Telemetry    NSR - Personally Reviewed  Physical Exam   GEN: No acute distress.  HEENT: Normocephalic, atraumatic, sclera non-icteric. Neck: No JVD or bruits. Cardiac: RRR no murmurs, rubs, or gallops.  Respiratory: Clear to auscultation bilaterally. Breathing is unlabored. GI: Soft, nontender, non-distended, BS +x 4. MS: no deformity. Extremities: No clubbing or cyanosis. No edema. Distal pedal pulses are 2+ and equal bilaterally. Neuro:  AAOx3. Follows commands. Psych:  Responds to questions appropriately with a normal affect.  Labs    High Sensitivity Troponin:    Recent Labs  Lab 01/16/22 0522 01/16/22 0826 01/16/22 1448 01/16/22 1632  TROPONINIHS 6 52* 553* 551*      Cardiac EnzymesNo results for input(s): TROPONINI in the last 168 hours. No results for input(s): TROPIPOC in the last 168 hours.   Chemistry Recent Labs  Lab 01/16/22 0522 01/17/22 0234 01/18/22 0113  NA 141 141 141  K 3.9 3.8 3.6  CL 108 108 107  CO2 _0 GLUCOSE 122* 74 89  BUN 25* 13 10  CREATININE 1.08* 0.59 0.67  CALCIUM 8.8* 8.0* 8.1*  PROT 6.4*  --   --   ALBUMIN 3.3*  --   --   AST 24  --   --   ALT <5  --   --   ALKPHOS 49  --   --   BILITOT 0.6  --   --   GFRNONAA 56* >60 >60  ANIONGAP 5 4* 7     Hematology Recent Labs  Lab 01/16/22 0522 01/17/22 0234 01/18/22 0113  WBC 6.5 7.9 6.2  RBC 3.97 3.56* 3.84*  HGB 9.9* 9.0* 9.6*  HCT 30.9* 26.5* 28.2*  MCV 77.8* 74.4* 73.4*  MCH 24.9* 25.3* 25.0*  MCHC 32.0 34.0 34.0  RDW 15.2 15.0 14.8  PLT 350 272 295    BNPNo results for input(s): BNP, PROBNP in the last 168 hours.   DDimer No results for input(s): DDIMER in the last 168 hours.   Radiology    DG Humerus Left  Result Date: 01/16/2022 CLINICAL DATA:  71 year old female status post fall. EXAM: LEFT HUMERUS - 2+ VIEW COMPARISON:  Chest radiographs 0518 hours today. FINDINGS: Degenerative spurring at the left shoulder. Left humerus appears intact with grossly preserved alignment at the left shoulder and elbow. Degenerative spurring at the elbow. Stable visible left chest, no left rib fracture identified. Negative visible bowel gas. IMPRESSION: No acute fracture or dislocation identified about the left humerus. Electronically Signed   By: Genevie Ann M.D.   On: 01/16/2022 11:04   ECHOCARDIOGRAM COMPLETE  Result Date: 01/17/2022    ECHOCARDIOGRAM REPORT   Patient Name:   Anna Bernard [facility] Date of Exam: 01/17/2022 Medical Rec #:  [id number]      Height:       61.0 in Accession #:    [id number]     Weight:       163.0 lb Date of Birth:  05-17-1951        BSA:          1.731 m Patient Age:    52 years       BP:           155/74 mmHg Patient Gender: F              HR:           76 bpm. Exam Location:  Inpatient Procedure: 2D Echo, 3D Echo, Cardiac Doppler and Color Doppler Indications:    R07.9* Chest pain, unspecified. Elevated troponin  History:        Patient has prior history of Echocardiogram examinations, most                 recent 09/30/2021. Abnormal ECG, Arrythmias:Atrial Fibrillation,                 Signs/Symptoms:Chest Pain and Dyspnea; Risk                 Factors:Dyslipidemia, Diabetes and Hypertension.  Sonographer:    Roseanna Rainbow RDCS Referring Phys: Francis Dowse Gwyndolyn Saxon A HENSEL  Sonographer Comments: Suboptimal parasternal window. IMPRESSIONS  1. Left ventricular ejection fraction, by estimation, is 60 to 65%. The left ventricle has normal function. The left ventricle demonstrates regional wall motion abnormalities (see scoring diagram/findings for description). There is mild left ventricular  hypertrophy. Left ventricular diastolic parameters were normal. There is mild hypokinesis of the left ventricular, basal inferolateral wall.  2. Right ventricular systolic function is normal. The right ventricular size is normal.  3. Left atrial size was moderately dilated.  4. The mitral valve is normal in structure. Trivial mitral valve regurgitation. No evidence of mitral stenosis. Moderate mitral annular calcification.  5. The aortic valve is normal in structure. There is mild calcification of the aortic valve. Aortic valve regurgitation is trivial. Aortic valve sclerosis is present, with no evidence of aortic valve stenosis.  6. The inferior vena cava is normal in size with <50% respiratory variability, suggesting right atrial pressure of 8 mmHg.  7. Cannot exclude a small PFO. Comparison(s): No significant change from prior study. Prior images reviewed side by side. Mild hypokinesis of basal inferolateral wall seen on current and prior study, no visual change  between studies. FINDINGS  Left Ventricle: Left ventricular ejection fraction, by estimation, is 60 to 65%. The left ventricle has normal function. The left ventricle demonstrates regional wall motion abnormalities. Mild hypokinesis of the left ventricular, basal inferolateral wall. The left ventricular internal cavity size was normal in size. There is mild left ventricular hypertrophy. Left ventricular diastolic parameters were normal. Right Ventricle: The right ventricular size is normal. Right vetricular wall thickness was not well visualized. Right ventricular systolic function is normal. Left Atrium: Left atrial size was moderately dilated. Right Atrium: Right atrial size was normal in size. Pericardium: There is no evidence of pericardial effusion. Mitral Valve: The mitral valve is normal in structure. Moderate mitral annular calcification. Trivial mitral valve regurgitation. No evidence of mitral valve stenosis. Tricuspid Valve: The tricuspid valve is normal in structure. Tricuspid valve regurgitation is trivial. No evidence of tricuspid stenosis. Aortic Valve: The aortic valve is normal in structure. There is mild calcification of the aortic valve. Aortic valve regurgitation is trivial. Aortic valve sclerosis is present, with no evidence of aortic valve stenosis. Pulmonic Valve: The pulmonic valve was not well visualized. Pulmonic valve regurgitation is not visualized. No evidence of pulmonic stenosis. Aorta: The aortic root, ascending aorta, aortic arch and descending aorta are all structurally normal, with no evidence of dilitation or obstruction. Venous: The inferior vena cava is normal in size with less than 50% respiratory variability, suggesting right atrial pressure of 8 mmHg. IAS/Shunts: Cannot exclude a small PFO.  LEFT VENTRICLE PLAX 2D LVIDd:         3.00 cm     Diastology LVIDs:         2.10 cm     LV e' medial:    9.79 cm/s LV PW:         1.40 cm     LV E/e' medial:  5.8 LV IVS:        1.60 cm      LV e' lateral:   8.92 cm/s LVOT diam:     2.10 cm     LV E/e' lateral: 6.3 LV SV:         92 LV SV Index:   53 LVOT Area:     3.46 cm                             3D Volume EF: LV Volumes (MOD)           3D EF:        60 % LV vol d, MOD A2C: 87.7 ml LV EDV:       98 ml LV vol d, MOD A4C: 81.3 ml LV ESV:       39 ml LV vol s, MOD A2C: 27.1 ml LV SV:        59 ml LV vol s, MOD A4C: 25.1 ml LV SV MOD A2C:     60.6 ml LV SV MOD A4C:     81.3 ml LV SV MOD BP:      57.2 ml RIGHT VENTRICLE             IVC RV S prime:     10.70 cm/s  IVC diam: 2.00 cm TAPSE (M-mode): 2.0 cm LEFT ATRIUM             Index        RIGHT ATRIUM           Index LA diam:  3.60 cm 2.08 cm/m   RA Area:     14.60 cm LA Vol (A2C):   57.4 ml 33.15 ml/m  RA Volume:   35.30 ml  20.39 ml/m LA Vol (A4C):   95.0 ml 54.87 ml/m LA Biplane Vol: 75.3 ml 43.49 ml/m  AORTIC VALVE LVOT Vmax:   127.00 cm/s LVOT Vmean:  88.000 cm/s LVOT VTI:    0.265 m  AORTA Ao Root diam: 3.20 cm Ao Asc diam:  2.80 cm MITRAL VALVE MV Area (PHT): 4.68 cm     SHUNTS MV Decel Time: 162 msec     Systemic VTI:  0.26 m MV E velocity: 56.30 cm/s   Systemic Diam: 2.10 cm MV A velocity: 111.00 cm/s MV E/A ratio:  0.51 Buford Dresser MD Electronically signed by Buford Dresser MD Signature Date/Time: 01/17/2022/1:04:43 PM    Final     Cardiac Studies   2D echo 01/17/22   1. Left ventricular ejection fraction, by estimation, is 60 to 65%. The  left ventricle has normal function. The left ventricle demonstrates  regional wall motion abnormalities (see scoring diagram/findings for  description). There is mild left ventricular   hypertrophy. Left ventricular diastolic parameters were normal. There is  mild hypokinesis of the left ventricular, basal inferolateral wall.   2. Right ventricular systolic function is normal. The right ventricular  size is normal.   3. Left atrial size was moderately dilated.   4. The mitral valve is normal in structure. Trivial  mitral valve  regurgitation. No evidence of mitral stenosis. Moderate mitral annular  calcification.   5. The aortic valve is normal in structure. There is mild calcification  of the aortic valve. Aortic valve regurgitation is trivial. Aortic valve  sclerosis is present, with no evidence of aortic valve stenosis.   6. The inferior vena cava is normal in size with <50% respiratory  variability, suggesting right atrial pressure of 8 mmHg.   7. Cannot exclude a small PFO.   Comparison(s): No significant change from prior study. Prior images  reviewed side by side. Mild hypokinesis of basal inferolateral wall seen  on current and prior study, no visual change between studies.   Patient Profile     71 y.o. female with a hx of hyperlipidemia, hypertension, prediabetes, Parkinson's disease, vitamin B12 deficiency, microcytic anemia, chronic low back pain and multiple falls admitted for falls/dizziness (felt less likely syncope) and AKI in setting of sepsis 2nd to UTI. Cardiology consulted for new afib RVR.   Assessment & Plan    1. New onset atrial fibrillation - admitted with AF RVR of unknown duration, spontaneously converted to NSR after being given IV amiodarone briefly - initiated on Eliquis 31m BID cautiously, acknowledging fall risk, but risk-benefit analysis felt to point towards anticoagulation -> can continue to reassess at each outpatient visit - continue metoprolol 258mBID  2. Elevated troponin - peak 553, felt possibly r/t demand ischemia - 2D echo 01/17/22 EF 60-65%, mild HK of left ventricular, basal inferolateral wall, moderate MAC, aortic sclerosis without stenosis, cannot exclude PFO; no visual change from prior - not on ASA due to concomitant Eliquis - plan cor CTA today, reviewed BB plan with RN navigator and confirmed going to proceed today  3. Essential HTN - hold ARB dose today to allow for pre-CT meds, SBP 10756ystolic - otherwise follow on BB  4. Hyperlipidemia -  maintained on home rosuvastatin - 01/17/2022: Cholesterol 133; HDL 48; LDL Cholesterol 72; Triglycerides 65; VLDL 13  5. Sepsis due to UTI, mild AKI on labs as well - per primary team - creatinine normalized (peak 1.08)  6. Elevated TSH - per medicine team  7. Microcytic anemia - Hgb appears in 9-10 range c/w baseline - continue outpatient medical f/u  Tentatively arranged f/u 02/07/22 and placed info on AVS. Dr. Oval Linsey and I are both here half day, so if cor CT not resulted by noon will plan to s/o to on-call APP Cecilie Kicks NP to review upon completion.  For questions or updates, please contact [place] Please consult [email or web address] for contact info under Cardiology/STEMI.  Signed, Charlie Pitter, PA-C 01/18/2022, 8:02 AM

## 2022-01-18 NOTE — Progress Notes (Signed)
Spoke with radiologist Dr Aletta Edouard regarding coronary CT read. He explained that pt definitely has a PE. The limitations of this view are that we cannot see the entire heart but can see most it. His recommendation is that if she becomes unstable we can do a CTAngio however do not need to right now as the suspicion of PE is definite. ? ?Lattie Haw MD  ?PGY-3 ?

## 2022-01-18 NOTE — Progress Notes (Signed)
? ?  Pt has non obstructive CAD per cardiac CTA.  Further recommendations tomorrow per Dr. Duke Salvia.  It was also noted that pt has Nonocclusive right-sided pulmonary embolism. This is potentially not ?completely visualized but appears to be nonocclusive at the level of ?the distal right pulmonary artery and extending into lower lobe ?segmental branches. Consider full CTA of the chest with more optimal ?pulmonary artery opacification. ? ?I sent secure chat to [facility] MD and RN notified them as well. ?I paged on call MD but no response as of yet. ?Cardiology will defer to primary team for further treatment of non occlusive Rt sided PE.  ?

## 2022-01-18 NOTE — Progress Notes (Signed)
RE: Anna Bernard ?Date of Birth: 07/28/1951 ?Date: 01/18/22 ? ?Please be advised that the above-named patient will require a short-term nursing home stay - anticipated 30 days or less for rehabilitation and strengthening.  The plan is for return home.  ?

## 2022-01-18 NOTE — TOC Progression Note (Addendum)
Transition of Care (TOC) - Progression Note  ? ? ?Patient Details  ?Name: Anna Bernard ?MRN: [id number] ?Date of Birth: 1950/12/30 ? ?Transition of Care (TOC) CM/SW Contact  ?Mearl Latin, LCSW ?Phone Number: ?01/18/2022, 1:07 PM ? ?Clinical Narrative:    ?1pm-CSW received consult for possible SNF placement at time of discharge. CSW spoke with patient and spouse at bedside who are both very pleasant. Patient reported that patient's spouse is currently unable to care for patient at their home given patient?s current physical needs and fall risk. Patient expressed understanding of PT recommendation and is agreeable to SNF placement at time of discharge since [facility] was unable to accept patient. Patient reports [facility]. CSW discussed insurance authorization process and provided Medicare SNF ratings list. Patient has received COVID vaccines. CSW will send out referrals for review. Patient expressed being hopeful for rehab and to feel better soon. Her spouse was hoping to get her home but patient wants to decrease his care burden. Pasrr pending.  ? ? ?3pm-CSW provided SNF bed offers to patient and spouse. ? ? ?Skilled Nursing Rehab Facilities-   [email or web address]   Ratings out of 5 possible   ?Name Address  Phone # Quality Care Staffing Health Inspection Overall  ?[facility] [address], [place] [phone] 4 5 2 3   ?[facility]  [address], [place] [phone] 3 2 5 5   ?[facility] [address] [place], [address] [place] 3 1 1 1   ?[facility] [address] 3 2 4 4   ?[facility] [address], [phone] 1 1 2 1   ?[facility] [address], [place] [phone] 2 1 4 3   ?[facility] [address], [address] [place] 5 2 3 4   ?[facility] [address] [facility] [place] 5 2 2 3   ?[place] ([place]) [address]  [facility] 5 1 2 2   ?[facility] [address], [address] [phone] 4 1 2 1   ?[facility] [address], [place] [phone] 4 1 2 1   ?[facility] ([place]) [address] , [place]in[place] Otto [phone] 4 1 1 1   ? [address] [phone] 3 2 4 4   ?        ?[facility] [address] [facility] [place]      ?[facility] [address], [phone] 4 2 3 3   ?[facility]  [address] [phone] 4 1 5 4   ?[facility] [place] [facility], [address] [place] 2 1 1 1   ?[facility] [address], [facility] [phone] 2 1 3 2   ?        ?[address] (no [facility]) [address], [place] Ditto [phone] 4 5 5 5   ?[facility] (No [employer]) [address], [place] 975 3 1 4 3   ?[facility] (No [employer]) [address] [place] [phone] 5 5 5 5   ?[facility] [address], [place] [phone] 3 2 4 4   ?[facility] [address] [place] [address] [facility] 1 1 2 1   ?[facility] [address] Suan Halter [phone] 2 1 1 1   ?[facility] [address] [place] 5 2 4 5   ?[facility] [address] [place] 3 1 1 1   ?[facility] [address] [place], [place] 2 1 2 1   ?        ?  [facility] [facility] [address] [place] [phone] 1 1 1 1   ? [address]  [phone] 2 4 2 2   ?[facility] [place] [address], [phone] 5 2 3 4   ?[facility] [place] [address], [facility] 2 1 1 1   ?[facility] (No [employer]) [address], [place] [phone] 2 1 3 2   ?[facility] [address], [place] [phone] 3 1 1 1   ?        ?[facility] [address] [place], Rosalita Levan [phone] 5 4 5 5   ?[facility])  [address], [place] 2 2 3 3    ?[facility]) [address], [facility] [address] 3 2 4 4   ?[facility] [address], [facility] 4 3 4 4   ?[address] [place], [phone] 3 3 1 1   ?[facility] Rehab [facility]) [address] [place] [phone] 2 2 4 4   ? ? ? ? ?Expected Discharge Plan: Skilled Nursing Facility ?Barriers to Discharge: SNF Pending bed offer, Awaiting State Approval (PASRR), Insurance Authorization ? ?Expected Discharge Plan and Services ?Expected Discharge Plan: Skilled Nursing Facility ?In-house Referral: Clinical Social Work ?Discharge Planning Services: CM Consult ?Post Acute Care Choice: Skilled Nursing Facility ?Living arrangements for the past 2 months: Single Family Home ?                ?  ?  ?  ?  ?  ?HH Arranged: PT, OT ?HH Agency: [facility] ?Date HH Agency Contacted: 01/17/22 ?Time HH Agency Contacted: 1518 ?Representative spoke with at [facility] Agency: [place] ? ? ?Social Determinants of Health (SDOH) Interventions ?  ? ?Readmission Risk Interventions ?   ? View : No data to display.  ?  ?  ?  ? ? ?

## 2022-01-18 NOTE — [place] FL2 (Signed)
?[place] MEDICAID FL2 LEVEL OF CARE SCREENING TOOL  ?  ? ?IDENTIFICATION  ?Patient Name: ?Anna Bernard Birthdate: 07/28/1951 Sex: female Admission Date (Current Location): ?01/16/2022  ?[place] and [place] Number: ? [place] ?  Facility and Address:  ?[facility] [place], [place] [address], [place], [place] [address] ?     Provider Number: ?[id number]  ?Attending Physician Name and Address:  ?Zenia Resides, MD ? Relative Name and Phone Number:  ?  ?   ?Current Level of Care: ?SNF Recommended Level of Care: ?[place] Prior Approval Number: ?  ? ?Date Approved/Denied: ?  PASRR Number: ? Pending ? ?Discharge Plan: ?SNF ?  ? ?Current Diagnoses: ?Patient Active Problem List  ? Diagnosis Date Noted  ? Non-ST elevation (NSTEMI) myocardial infarction [facility])   ? Fall 01/16/2022  ? Concussion wth loss of consciousness of 30 minutes or less   ? Paroxysmal atrial fibrillation (HCC)   ? Chronic bilateral low back pain without sciatica 01/14/2022  ? Dyspnea on exertion 09/28/2021  ? Seborrheic keratoses 07/09/2021  ? Parkinson disease ([place]) 04/23/2020  ? Leg swelling 11/26/2019  ? Chronic venous insufficiency 11/26/2019  ? Vitamin B12 deficiency 02/06/2018  ? Sepsis ([place]) 01/29/2018  ? Microcytic anemia 01/29/2018  ? Renal insufficiency 01/29/2018  ? Pure hypercholesterolemia 12/24/2014  ? Diabetes mellitus type 2, controlled, without complications ([place]) [id number]  ? Bruit of right carotid artery 12/24/2014  ? Thyroid disease 07/06/2014  ? Severe obesity (BMI >= 40) ([place]) 07/06/2014  ? Essential hypertension 07/06/2014  ? ? ?Orientation RESPIRATION BLADDER Height & Weight   ?  ?Self, Place ? Normal Incontinent, External catheter Weight: 162 lb 7.7 oz (73.7 kg) ?Height:  5\' 1"  (154.9 cm)  ?BEHAVIORAL SYMPTOMS/MOOD NEUROLOGICAL BOWEL NUTRITION STATUS  ?    Incontinent Diet (See dc summary)  ?AMBULATORY STATUS COMMUNICATION OF NEEDS Skin   ?Extensive Assist Verbally Normal ?  ?  ?  ?    ?     ?      ? ? ?Personal Care Assistance Level of Assistance  ?Bathing, Feeding, Dressing Bathing Assistance: Limited assistance ?Feeding assistance: Independent ?Dressing Assistance: Limited assistance ?   ? ?Functional Limitations Info  ?Sight Sight Info: Impaired ?  ?   ? ? ?SPECIAL CARE FACTORS FREQUENCY  ?PT (By licensed PT), OT (By licensed OT)   ?  ?PT Frequency: 5x/week ?OT Frequency: 5x/week ?  ?  ?  ?   ? ? ?Contractures Contractures Info: Not present  ? ? ?Additional Factors Info  ?Code Status, Allergies, Isolation Precautions Code Status Info: Full ?Allergies Info: NKA ?  ?  ?Isolation Precautions Info: ESBL ?   ? ?Current Medications (01/18/2022):  This is the current hospital active medication list ?Current Facility-Administered Medications  ?Medication Dose Route Frequency Provider Last Rate Last Admin  ? acetaminophen (TYLENOL) tablet 650 mg  650 mg Oral Q6H PRN Holley Bouche, MD   650 mg at 01/17/22 2225  ? apixaban (ELIQUIS) tablet 5 mg  5 mg Oral BID Ezequiel Essex, MD   5 mg at 01/18/22 [id number]  ? Carbidopa-Levodopa ER (SINEMET CR) 25-100 MG tablet controlled release 2 tablet  2 tablet Oral BID Hammons, Theone Murdoch, RPH   2 tablet at 01/18/22 1124  ? And  ? Carbidopa-Levodopa ER (SINEMET CR) 25-100 MG tablet controlled release 3 tablet  3 tablet Oral Q24H Hammons, Theone Murdoch, RPH   3 tablet at 01/18/22 [id number]  ? fosfomycin (MONUROL) packet 3 g  3 g Oral Once Orvis Brill, DO      ? [START ON 01/19/2022] irbesartan (AVAPRO) tablet 150 mg  150 mg Oral Daily Dunn, Dayna N, PA-C      ? levothyroxine (SYNTHROID) tablet 150 mcg  150 mcg Oral Daily Erskine Emery, MD   150 mcg at 01/18/22 [id number]  ? lidocaine (LIDODERM) 5 % 1 patch  1 patch Transdermal Q24H Ezequiel Essex, MD   1 patch at 01/18/22 0919  ? metoprolol tartrate (LOPRESSOR) tablet 25 mg  25 mg Oral BID Erskine Emery, MD   25 mg at 01/18/22 [id number]  ? polyethylene glycol (MIRALAX / GLYCOLAX) packet 17 g  17 g Oral Daily PRN Erskine Emery, MD   17 g at  01/16/22 1445  ? rosuvastatin (CRESTOR) tablet 10 mg  10 mg Oral Daily Erskine Emery, MD   10 mg at 01/18/22 [id number]  ? ? ? ?Discharge Medications: ?Please see discharge summary for a list of discharge medications. ? ?Relevant Imaging Results: ? ?Relevant Lab Results: ? ? ?Additional Information ?SSN: [id number]. Pfizer COVID-19 Vaccine 07/20/2020 , 01/07/2020 , 12/14/2019  Pfizer Covid-19 Vaccine Bivalent Booster 06/29/2021 ? ?Lissa Morales Nyx Keady, LCSW ? ? ? ? ?

## 2022-01-18 NOTE — Progress Notes (Signed)
Received call from CT imaging due to pts cardiac CTA results. Notified family medicine practice under MD Hensel. No new orders were given.  ?

## 2022-01-18 NOTE — Discharge Summary (Shared)
Family Medicine Teaching Service ?Hospital Discharge Summary ? ?Patient name: Anna Bernard record number: [id number] ?Date of birth: September 09, 1951 Age: 71 y.o. Gender: female ?Date of Admission: 01/16/2022  Date of Discharge: *** ?Admitting Physician: Erskine Emery, MD ? ?Primary Care Provider: [name], MD ?Consultants: Cardiology ? ?Indication for Hospitalization: Fall ? ?Discharge Diagnoses/Problem List:  ?Principal Problem: ?  Fall ?Active Problems: ?  Sepsis ([place]) ?  Parkinson disease ([place]) ?  Non-ST elevation (NSTEMI) myocardial infarction [facility]) ? ? ?Disposition: SNF*** ? ?Discharge Condition: Stable ? ?Discharge Exam:  ?General: NAD, laying in bed comfortably, A&Ox4 ?Cardiovascular: RRR no m/r/g ?Respiratory: CTAB no w/r/c ?Abdomen: Nontender to palpation, soft ?Extremities: No lower extremity edema*** ? ?Brief Hospital Course:  ?Anna Bernard is a 71 y.o.female with a history of hypothyroidism, HTN, HLD, T2DM, microcytic anemia on oral iron, vit B12 deficiency, venous insufficiency, Parkinson's, chronic low back pain who was admitted to the [facility] Medicine Teaching Service at [facility] for recurrent falls. Her hospital course is detailed below: ? ?Recurrent Falls  ?New Dizziness  ?Mechanical Fall  Possible concussion ?Multiple episodes of falls in the past, dizziness prior to most recent fall complicated by bathrooms that prevent her from using her walker due to lack of space. Patient fell and hit her head, then had syncopal event after fall. Pt had a GCS of 8 initially, and met sepsis criteria (see below). CT head showed no acute hemorrhage and patient was monitored for changes in mental status throughout hospitalization. She quickly returned to baseline. Appears that fall could be secondary to advancing parkinson's disease with possible component of spinal cord impingement.  Cardiology was consulted and obtained updated echo that showed EF 60 to 65%, mild LVH,  L atrium mod dilated, not changed  from previously.  PT and OT recommended inpatient rehab but was screened out, patient was amenable from short term SNF.  New onset dizziness that was not present in previous falls could very likely be in the setting of A-fib, patient spontaneously converted to NSR.  ? ?A-fib with RVR ?She does not appear the patient had a history of A-fib prior to admission.  Patient's rate was initially in the 140s in A-fib.  Heparin drip started in the ED, started amiodarone load but then discontinued this shortly after.  Patient converted to normal sinus rhythm prior to echo being done. Echo showed EF 60 to 65%, mild LVH,  L atrium mod dilated, not changed from previously. Coronary CTA ordered and showed ***. Eliquis was discontinued at discharge due to progressive nature of parkinson's disease with likelihood of recurrent falls worsening. .Also had CHA2DS2-VASc of 3. Discussed risks and benefits with patient about anticoagulation in depth. ? ?UTI sepsis ?Patient met sepsis criteria with tachypnea, tachycardia, hypotension and known source of infection.  UA showed many bacteria and leukocytes, started treatment with empiric antibiotics given sepsis cefepime, metronidazole, vancomycin.  De-escalated appropriately and urine culture showed >100,000 colonies of E coli. Was given CTX x1 and fosfomycin x 1 after sensitivities returned. ? ? ?Other chronic conditions were medically managed with home medications and formulary alternatives as necessary (hyperlipidemia, hypothyroidism, hypertension, Parkinson's disease, chronic lower back pain, chronic constipation) ? ?PCP Follow-up Recommendations: ?- TSH elevated to 6.892, PCP to recheck and adjust levothyroxine ? ? ?Issues for Follow Up:  ?TSH elevated to 6.892, PCP to recheck and adjust levothyroxine ?Eliquis D/c'd due to recurrent falls and progressive nature of Parkinson's disease with high risk of bleeding from falls ? ?Significant Procedures: None ? ?  Significant Labs and Imaging:   ?Recent Labs  ?Lab 01/16/22 ?0522 01/17/22 ?0234 01/18/22 ?0113  ?WBC 6.5 7.9 6.2  ?HGB 9.9* 9.0* 9.6*  ?HCT 30.9* 26.5* 28.2*  ?PLT 350 272 295  ? ?Recent Labs  ?Lab 01/16/22 ?0522 01/17/22 ?0234 01/18/22 ?0113  ?NA 141 141 141  ?K 3.9 3.8 3.6  ?CL 108 108 107  ?CO2 28 29 27   ?GLUCOSE 122* 74 89  ?BUN 25* 13 10  ?CREATININE 1.08* 0.59 0.67  ?CALCIUM 8.8* 8.0* 8.1*  ?ALKPHOS 49  --   --   ?AST 24  --   --   ?ALT <5  --   --   ?ALBUMIN 3.3*  --   --   ? ? ?*** ? ?Results/Tests Pending at Time of Discharge:  ? ?Discharge Medications:  ?Allergies as of 01/18/2022   ?No Known Allergies ?  ?Med Rec must be completed prior to using this [place]*** ? ? ? ? ? ? ?Discharge Instructions: Please refer to Patient Instructions section of EMR for full details.  Patient was counseled important signs and symptoms that should prompt return to medical care, changes in medications, dietary instructions, activity restrictions, and follow up appointments.  ? ?Follow-Up Appointments: ? Follow-up Information   ? ? [facility]. Go on 01/24/2022.   ?Why: At 10:30am. You have a hospital follow up appointment with your family medicine clinic on Monday, 5/08 at 10:30am. Please arrive by 10:15am. ?Contact information: ?[address] ?[place] ?[phone] ? ?  ?  ? ? Care, [facility] Follow up.   ?Specialty: Home Health Services ?Why: Home Health PT and OT arranged. They will contact you 1 to 2 days after discharge to schedule apt ?Contact information: ?[address] ?[address] ?[place] [address] ?[phone] ? ? ?  ?  ? ? Charlie Pitter, PA-C Follow up.   ?Specialties: Cardiology, Radiology ?Why: [place] location - cardiology follow-up arranged for you on Monday Feb 07, 2022 at 10:05 AM (Arrive by 9:50 AM). Lisbeth Renshaw is one of the cardiology PAs that works with Dr. Radford Pax. ?Contact information: ?[address] ?[address] ?[place]  [address] ?[phone] ? ? ?  ?  ? ?  ?  ? ?  ? ? ?Gerrit Heck, MD ?01/18/2022, 1:13 PM ?PGY-1, [place]  ?

## 2022-01-18 NOTE — Progress Notes (Addendum)
FPTS Brief Progress Note ? ?S: patient sleeping ? ? ?O: ?BP (!) 154/57 (BP Location: Right Arm)   Pulse 71   Temp 98.5 ?F (36.9 ?C) (Axillary)   Resp 17   Ht 5\' 1"  (1.549 m)   Wt 73.9 kg   SpO2 95%   BMI 30.80 kg/m?   ? ? ?A/P: ?- Orders reviewed. Labs for AM ordered, which was adjusted as needed.  ?- See daily progress note for additional details ? ?Gladys Damme, MD ?01/18/2022, 2:46 AM ?PGY-3, [place] Medicine Night Resident  ?Please page [phone] with questions.  ? ? ?

## 2022-01-18 NOTE — Progress Notes (Signed)
Inpatient Rehab Admissions Coordinator:   Per therapy recommendations, patient was screened for CIR candidacy by Jestin Burbach, MS, CCC-SLP. At this time, Pt. does not appear to demonstrate medical necessity to justify in hospital rehabilitation/CIR. will not pursue a rehab consult for this Pt.   Recommend other rehab venues to be pursued.  Please contact me with any questions.  Earma Nicolaou, MS, CCC-SLP Rehab Admissions Coordinator  [phone] (celll) [phone] (office)   

## 2022-01-18 NOTE — Progress Notes (Addendum)
Physical Therapy Treatment ?Patient Details ?Name: Anna Bernard ?MRN: [id number] ?DOB: 10-13-1950 ?Today's Date: 01/18/2022 ? ? ?History of Present Illness 71 y.o. female presenting with frequent falls due to known Parkinson's Disease. Fall 4/30 with LOC and concussion; +afib RVR in ED; shoulder pain with negative xray; +UTI, sepsis; elevated troponin with Cardiology consult feels demand ischemia due to afib RVR ? ?  ?PT Comments  ? ? Patient with significantly worse performance today with inability to ambulate with rollator and 1 person assist. Patient with normal sitting balance, however as comes to stand has increased rt lateral lean. She had mild rt lateral lean 5/1, however was able to work on wt-shifting and come to midline and progress to ambulation. Despite 3 standing transfers today, could not maintain midline and had to return to bed. Discussed need for incr time with rehab prior to going home where she has 5 steps to enter home and husband can provide min assist. Patient in agreement that she cannot go home at current functional level. Discharge plan recommendation updated to AIR. RN made aware.  ?   ?Recommendations for follow up therapy are one component of a multi-disciplinary discharge planning process, led by the attending physician.  Recommendations may be updated based on patient status, additional functional criteria and insurance authorization. ? ?Follow Up Recommendations ? Acute inpatient rehab (3hours/day) ?  ?  ?Assistance Recommended at Discharge Frequent or constant Supervision/Assistance  ?Patient can return home with the following Direct supervision/assist for medications management;Direct supervision/assist for financial management;Assist for transportation;Help with stairs or ramp for entrance;Two people to help with walking and/or transfers;Two people to help with bathing/dressing/bathroom;Assistance with cooking/housework ?  ?Equipment Recommendations ? None recommended by PT  ?   ?Recommendations for Other Services Rehab consult ? ? ?  ?Precautions / Restrictions Precautions ?Precautions: Fall ?Precaution Comments: hx of Parkinsons ?Restrictions ?Weight Bearing Restrictions: No  ?  ? ?Mobility ? Bed Mobility ?Overal bed mobility: Needs Assistance ?Bed Mobility: Rolling, Sidelying to Sit, Sit to Supine ?Rolling: Supervision ?Sidelying to sit: Mod assist, HOB elevated ?  ?Sit to supine: Min assist ?  ?General bed mobility comments: uses rail to roll (has rail at home); able to move LEs over EOB without assist, but assist to raise torso and scoot out to EOB; return to supine with assist to scoot pelvis toward center of bed once supine ?  ? ?Transfers ?Overall transfer level: Needs assistance ?Equipment used: Rolling walker (2 wheels) ?Transfers: Sit to/from Stand ?Sit to Stand: Max assist ?  ?  ?  ?  ?  ?General transfer comment: from EOB with pt pushing off surface x 3 reps with significant rt lean ?  ? ?Ambulation/Gait ?Ambulation/Gait assistance: Mod assist ?  ?  ?  ?  ?  ?Pre-gait activities: side-steps to her right with rollator with mod assist for balance and max cues for sequencing ?General Gait Details: due to rt lean and flexed posture unsafe to progress to ambulation; ? ? ?Stairs ?  ?  ?  ?  ?  ? ? ?Wheelchair Mobility ?  ? ?Modified Rankin (Stroke Patients Only) ?  ? ? ?  ?Balance Overall balance assessment: Needs assistance ?Sitting-balance support: No upper extremity supported, Feet supported ?Sitting balance-Leahy Scale: Fair ?  ?  ?Standing balance support: Bilateral upper extremity supported ?Standing balance-Leahy Scale: Poor ?Standing balance comment: strong Rt lateral lean in coming to and in standing; stood for up to 3 minutes at a time working on wt-shifting towards midline/left ?  ?  ?  ?  ?  ?  ?  ?  ?  ?  ?  ?  ? ?  ?  Cognition Arousal/Alertness: Awake/alert ?Behavior During Therapy: [facility] for tasks assessed/performed ?Overall Cognitive Status: History of cognitive  impairments - at baseline ?  ?  ?  ?  ?  ?  ?  ?  ?  ?  ?  ?  ?  ?  ?  ?  ?General Comments: following commands ?  ?  ? ?  ?Exercises   ? ?  ?General Comments General comments (skin integrity, edema, etc.): Discussed inability to go home with current functional status. ?  ?  ? ?Pertinent Vitals/Pain Pain Assessment ?Pain Assessment: No/denies pain  ? ? ?Home Living Family/patient expects to be discharged to:: Private residence ?Living Arrangements: Spouse/significant other ?Available Help at Discharge: Family ?Type of Home: House ?Home Access: Stairs to enter ?Entrance Stairs-Rails: Can reach both;Right;Left ?Entrance Stairs-Number of Steps: 5 ?  ?Home Layout: Two level;Full bath on main level ?Home Equipment: Rollator (4 wheels);Rolling Walker (2 wheels);BSC/3in1;Shower seat ?   ?  ?Prior Function    ?  ?  ?   ? ?PT Goals (current goals can now be found in the care plan section) Acute Rehab PT Goals ?Patient Stated Goal: return home ASAP ?Time For Goal Achievement: 01/31/22 ?Potential to Achieve Goals: Good ?Progress towards PT goals: Not progressing toward goals - comment (worse today) ? ?  ?Frequency ? ? ? Min 3X/week ? ? ? ?  ?PT Plan Discharge plan needs to be updated  ? ? ?Co-evaluation   ?  ?  ?  ?  ? ?  ?AM-PAC PT "6 Clicks" Mobility   ?Outcome Measure ? Help needed turning from your back to your side while in a flat bed without using bedrails?: A Little ?Help needed moving from lying on your back to sitting on the side of a flat bed without using bedrails?: A Lot ?Help needed moving to and from a bed to a chair (including a wheelchair)?: Total ?Help needed standing up from a chair using your arms (e.g., wheelchair or bedside chair)?: A Lot ?Help needed to walk in hospital room?: Total ?Help needed climbing 3-5 steps with a railing? : Total ?6 Click Score: 10 ? ?  ?End of Session Equipment Utilized During Treatment: Gait belt ?Activity Tolerance: Patient limited by fatigue ?Patient left: with call bell/phone  within reach;in bed;with bed alarm set ?Nurse Communication: Mobility status ?PT Visit Diagnosis: Repeated falls (R29.6);Difficulty in walking, not elsewhere classified (R26.2) ?  ? ? ?Time: [id number] ?PT Time Calculation (min) (ACUTE ONLY): 51 min ? ?Charges:  $Gait Training: 23-37 mins ?$Therapeutic Activity: 8-22 mins          ?          ? ? ?Arby Barrette, PT ?Acute Rehabilitation Services  ?Pager [phone] ?Office [phone] ? ? ? ?Jeanie Cooks Zanaria Morell ?01/18/2022, 10:45 AM ? ?

## 2022-01-19 DIAGNOSIS — D649 Anemia, unspecified: Secondary | ICD-10-CM | POA: Diagnosis not present

## 2022-01-19 DIAGNOSIS — G20 Parkinson's disease: Secondary | ICD-10-CM | POA: Diagnosis not present

## 2022-01-19 DIAGNOSIS — R4182 Altered mental status, unspecified: Secondary | ICD-10-CM | POA: Diagnosis not present

## 2022-01-19 DIAGNOSIS — F5101 Primary insomnia: Secondary | ICD-10-CM | POA: Diagnosis not present

## 2022-01-19 DIAGNOSIS — R600 Localized edema: Secondary | ICD-10-CM | POA: Diagnosis not present

## 2022-01-19 DIAGNOSIS — W19XXXD Unspecified fall, subsequent encounter: Secondary | ICD-10-CM | POA: Diagnosis not present

## 2022-01-19 DIAGNOSIS — M25552 Pain in left hip: Secondary | ICD-10-CM | POA: Diagnosis not present

## 2022-01-19 DIAGNOSIS — I10 Essential (primary) hypertension: Secondary | ICD-10-CM | POA: Diagnosis not present

## 2022-01-19 DIAGNOSIS — M545 Low back pain, unspecified: Secondary | ICD-10-CM | POA: Diagnosis not present

## 2022-01-19 DIAGNOSIS — I214 Non-ST elevation (NSTEMI) myocardial infarction: Secondary | ICD-10-CM | POA: Diagnosis not present

## 2022-01-19 DIAGNOSIS — R0989 Other specified symptoms and signs involving the circulatory and respiratory systems: Secondary | ICD-10-CM | POA: Diagnosis not present

## 2022-01-19 DIAGNOSIS — E039 Hypothyroidism, unspecified: Secondary | ICD-10-CM | POA: Diagnosis not present

## 2022-01-19 DIAGNOSIS — Z86711 Personal history of pulmonary embolism: Secondary | ICD-10-CM | POA: Diagnosis not present

## 2022-01-19 DIAGNOSIS — R404 Transient alteration of awareness: Secondary | ICD-10-CM | POA: Diagnosis not present

## 2022-01-19 DIAGNOSIS — I480 Paroxysmal atrial fibrillation: Secondary | ICD-10-CM | POA: Diagnosis not present

## 2022-01-19 DIAGNOSIS — W19XXXA Unspecified fall, initial encounter: Secondary | ICD-10-CM | POA: Diagnosis not present

## 2022-01-19 DIAGNOSIS — R2689 Other abnormalities of gait and mobility: Secondary | ICD-10-CM | POA: Diagnosis not present

## 2022-01-19 DIAGNOSIS — Z9181 History of falling: Secondary | ICD-10-CM | POA: Diagnosis not present

## 2022-01-19 DIAGNOSIS — E119 Type 2 diabetes mellitus without complications: Secondary | ICD-10-CM | POA: Diagnosis not present

## 2022-01-19 DIAGNOSIS — M6281 Muscle weakness (generalized): Secondary | ICD-10-CM | POA: Diagnosis not present

## 2022-01-19 DIAGNOSIS — I2699 Other pulmonary embolism without acute cor pulmonale: Secondary | ICD-10-CM

## 2022-01-19 DIAGNOSIS — I872 Venous insufficiency (chronic) (peripheral): Secondary | ICD-10-CM | POA: Diagnosis not present

## 2022-01-19 DIAGNOSIS — I2510 Atherosclerotic heart disease of native coronary artery without angina pectoris: Secondary | ICD-10-CM | POA: Diagnosis not present

## 2022-01-19 DIAGNOSIS — Z7689 Persons encountering health services in other specified circumstances: Secondary | ICD-10-CM | POA: Diagnosis not present

## 2022-01-19 DIAGNOSIS — K5900 Constipation, unspecified: Secondary | ICD-10-CM | POA: Diagnosis not present

## 2022-01-19 DIAGNOSIS — G4700 Insomnia, unspecified: Secondary | ICD-10-CM | POA: Diagnosis not present

## 2022-01-19 DIAGNOSIS — R300 Dysuria: Secondary | ICD-10-CM | POA: Diagnosis not present

## 2022-01-19 DIAGNOSIS — Q2112 Patent foramen ovale: Secondary | ICD-10-CM | POA: Diagnosis not present

## 2022-01-19 DIAGNOSIS — R0609 Other forms of dyspnea: Secondary | ICD-10-CM | POA: Diagnosis not present

## 2022-01-19 DIAGNOSIS — E7800 Pure hypercholesterolemia, unspecified: Secondary | ICD-10-CM | POA: Diagnosis not present

## 2022-01-19 DIAGNOSIS — G214 Vascular parkinsonism: Secondary | ICD-10-CM | POA: Diagnosis not present

## 2022-01-19 DIAGNOSIS — D509 Iron deficiency anemia, unspecified: Secondary | ICD-10-CM | POA: Diagnosis not present

## 2022-01-19 DIAGNOSIS — Z7401 Bed confinement status: Secondary | ICD-10-CM | POA: Diagnosis not present

## 2022-01-19 DIAGNOSIS — E079 Disorder of thyroid, unspecified: Secondary | ICD-10-CM | POA: Diagnosis not present

## 2022-01-19 LAB — BASIC METABOLIC PANEL
Anion gap: 7 (ref 5–15)
BUN: 12 mg/dL (ref 8–23)
CO2: 27 mmol/L (ref 22–32)
Calcium: 8.2 mg/dL — ABNORMAL LOW (ref 8.9–10.3)
Chloride: 107 mmol/L (ref 98–111)
Creatinine, Ser: 0.73 mg/dL (ref 0.44–1.00)
GFR, Estimated: >60 mL/min (ref >60)
Glucose, Bld: 96 mg/dL (ref 70–99)
Potassium: 3.8 mmol/L (ref 3.5–5.1)
Sodium: 141 mmol/L (ref 135–145)

## 2022-01-19 LAB — CBC
HCT: 28.6 % — ABNORMAL LOW (ref 36.0–46.0)
Hemoglobin: 9.5 g/dL — ABNORMAL LOW (ref 12.0–15.0)
MCH: 24.6 pg — ABNORMAL LOW (ref 26.0–34.0)
MCHC: 33.2 g/dL (ref 30.0–36.0)
MCV: 74.1 fL — ABNORMAL LOW (ref 80.0–100.0)
Platelets: 285 10*3/uL (ref 150–400)
RBC: 3.86 MIL/uL — ABNORMAL LOW (ref 3.87–5.11)
RDW: 14.9 % (ref 11.5–15.5)
WBC: 4.9 10*3/uL (ref 4.0–10.5)
nRBC: 0 % (ref 0.0–0.2)

## 2022-01-19 MED ORDER — APIXABAN 5 MG PO TABS: 10.0000 mg | ORAL_TABLET | Freq: Two times a day (BID) | ORAL | Status: DC

## 2022-01-19 MED ORDER — APIXABAN 5 MG PO TABS: 5.0000 mg | ORAL_TABLET | Freq: Two times a day (BID) | ORAL | Status: DC

## 2022-01-19 MED ORDER — ACETAMINOPHEN 325 MG PO TABS: 650.0000 mg | ORAL_TABLET | Freq: Four times a day (QID) | ORAL | Status: DC | PRN

## 2022-01-19 MED ORDER — METOPROLOL TARTRATE 25 MG PO TABS: 25.0000 mg | ORAL_TABLET | Freq: Two times a day (BID) | ORAL | 0 refills | Status: DC

## 2022-01-19 MED ORDER — LIDOCAINE 5 % EX PTCH: 1.0000 | MEDICATED_PATCH | CUTANEOUS | 0 refills | Status: DC

## 2022-01-19 MED ORDER — APIXABAN 5 MG PO TABS
5.0000 mg | ORAL_TABLET | Freq: Once | ORAL | Status: AC
  Administered 2022-01-19: 5 mg via ORAL
  Filled 2022-01-19: qty 1

## 2022-01-19 NOTE — Progress Notes (Signed)
Transferred  to SNF via PTAR  ?

## 2022-01-19 NOTE — TOC Progression Note (Addendum)
Transition of Care (TOC) - Progression Note  ? ? ?Patient Details  ?Name: Anna Bernard ?MRN: [id number] ?Date of Birth: 1950-12-03 ? ?Transition of Care (TOC) CM/SW Contact  ?Mearl Latin, LCSW ?Phone Number: ?01/19/2022, 10:21 AM ? ?Clinical Narrative:    ?10:21am-CSW received vm from patient's nephew requesting call back. CSW left return vm. ? ?CSW spoke with patient's spouse regarding SNF choice. He requested his daughter contact CSW. ? ?CSW spoke with patient's daughter. She is hoping for [facility] and will discuss with her dad; CSW checking with [facility] to ensure bed availability. Insurance process started pending facility choice, Ref# [id number].  ? ?[place] received and placed on FL2.  ? ?12pm-Family has confirmed [facility]. CSW informed insurance, auth pending. [facility] has a private bed for patient. ? ?3:15pm-Insurance approval received, Ref# [id number], effective 01/19/2022-01/23/2022. ? ? ?Expected Discharge Plan: Skilled Nursing Facility ?Barriers to Discharge: SNF Pending bed offer, Awaiting State Approval (PASRR), Insurance Authorization ? ?Expected Discharge Plan and Services ?Expected Discharge Plan: Skilled Nursing Facility ?In-house Referral: Clinical Social Work ?Discharge Planning Services: CM Consult ?Post Acute Care Choice: Skilled Nursing Facility ?Living arrangements for the past 2 months: Single Family Home ?                ?  ?  ?  ?  ?  ?HH Arranged: PT, OT ?HH Agency: [facility] ?Date HH Agency Contacted: 01/17/22 ?Time HH Agency Contacted: 1518 ?Representative spoke with at [facility] Agency: Kandee Keen ? ? ?Social Determinants of Health (SDOH) Interventions ?  ? ?Readmission Risk Interventions ?   ? View : No data to display.  ?  ?  ?  ? ? ?

## 2022-01-19 NOTE — Progress Notes (Signed)
Discharged to [facility] via [facility]. Via stretcher  ?

## 2022-01-19 NOTE — [place] FL2 (Signed)
?[place] MEDICAID FL2 LEVEL OF CARE SCREENING TOOL  ?  ? ?IDENTIFICATION  ?Patient Name: ?Anna Bernard Birthdate: [birth year] Sex: female Admission Date (Current Location): ?01/16/2022  ?[place] Number: ? [place] ?  Facility and Address:  ?[facility]. [facility], [address] [place], [place] [address] ?     Provider Number: ?[id number]  ?Attending Physician Name and Address:  ?Moses Manners, MD ? Relative Name and Phone Number:  ?  ?   ?Current Level of Care: ?SNF Recommended Level of Care: ?Skilled Nursing Facility Prior Approval Number: ?  ? ?Date Approved/Denied: ?  PASRR Number: ?[id number] A ? ?Discharge Plan: ?SNF ?  ? ?Current Diagnoses: ?Patient Active Problem List  ? Diagnosis Date Noted  ? Pulmonary embolus (HCC)   ? Non-ST elevation (NSTEMI) myocardial infarction [facility])   ? Fall 01/16/2022  ? Concussion wth loss of consciousness of 30 minutes or less   ? Paroxysmal atrial fibrillation (HCC)   ? Chronic bilateral low back pain without sciatica 01/14/2022  ? Dyspnea on exertion 09/28/2021  ? Seborrheic keratoses 07/09/2021  ? Parkinson disease (HCC) 04/23/2020  ? Leg swelling 11/26/2019  ? Chronic venous insufficiency 11/26/2019  ? Vitamin B12 deficiency 02/06/2018  ? Sepsis (HCC) 01/29/2018  ? Microcytic anemia 01/29/2018  ? Renal insufficiency 01/29/2018  ? Pure hypercholesterolemia 12/24/2014  ? Diabetes mellitus type 2, controlled, without complications (HCC) 12/24/2014  ? Bruit of right carotid artery 12/24/2014  ? Thyroid disease 07/06/2014  ? Severe obesity (BMI >= 40) (HCC) 07/06/2014  ? Essential hypertension 07/06/2014  ? ? ?Orientation RESPIRATION BLADDER Height & Weight   ?  ?Self, Place ? Normal Incontinent, External catheter Weight: 162 lb 14.7 oz (73.9 kg) ?Height:  5\' 1"  (154.9 cm)  ?BEHAVIORAL SYMPTOMS/MOOD NEUROLOGICAL BOWEL NUTRITION STATUS  ?    Incontinent Diet (See dc summary)  ?AMBULATORY STATUS COMMUNICATION OF NEEDS Skin   ?Extensive Assist Verbally  Normal ?  ?  ?  ?    ?     ?     ? ? ?Personal Care Assistance Level of Assistance  ?Bathing, Feeding, Dressing Bathing Assistance: Limited assistance ?Feeding assistance: Independent ?Dressing Assistance: Limited assistance ?   ? ?Functional Limitations Info  ?Sight Sight Info: Impaired ?  ?   ? ? ?SPECIAL CARE FACTORS FREQUENCY  ?PT (By licensed PT), OT (By licensed OT)   ?  ?PT Frequency: 5x/week ?OT Frequency: 5x/week ?  ?  ?  ?   ? ? ?Contractures Contractures Info: Not present  ? ? ?Additional Factors Info  ?Code Status, Allergies, Isolation Precautions Code Status Info: Full ?Allergies Info: NKA ?  ?  ?Isolation Precautions Info: ESBL ?   ? ?Current Medications (01/19/2022):  This is the current hospital active medication list ?Current Facility-Administered Medications  ?Medication Dose Route Frequency Provider Last Rate Last Admin  ? acetaminophen (TYLENOL) tablet 650 mg  650 mg Oral Q6H PRN 03/21/2022, MD   650 mg at 01/18/22 2133  ? apixaban (ELIQUIS) tablet 10 mg  10 mg Oral BID 2134, MD      ? Chilton Si ON 01/26/2022] apixaban (ELIQUIS) tablet 5 mg  5 mg Oral BID 03/28/2022, MD      ? apixaban Chilton Si) tablet 5 mg  5 mg Oral Once Reome, Earle J, RPH      ? Carbidopa-Levodopa ER (SINEMET CR) 25-100 MG tablet controlled release 2 tablet  2 tablet Oral BID Hammons, Everlene Balls, RPH   2  tablet at 01/18/22 1743  ? And  ? Carbidopa-Levodopa ER (SINEMET CR) 25-100 MG tablet controlled release 3 tablet  3 tablet Oral Q24H Hammons, Gerhard Munch, RPH   3 tablet at 01/19/22 0551  ? irbesartan (AVAPRO) tablet 150 mg  150 mg Oral Daily Dunn, Dayna N, PA-C   150 mg at 01/19/22 4315  ? levothyroxine (SYNTHROID) tablet 150 mcg  150 mcg Oral Daily Alfredo Martinez, MD   150 mcg at 01/19/22 0551  ? lidocaine (LIDODERM) 5 % 1 patch  1 patch Transdermal Q24H [name], MD   1 patch at 01/19/22 [phone]  ? metoprolol tartrate (LOPRESSOR) tablet 25 mg  25 mg Oral BID Alfredo Martinez, MD   25 mg at 01/19/22  6761  ? polyethylene glycol (MIRALAX / GLYCOLAX) packet 17 g  17 g Oral Daily PRN Alfredo Martinez, MD   17 g at 01/16/22 1445  ? rosuvastatin (CRESTOR) tablet 10 mg  10 mg Oral Daily Alfredo Martinez, MD   10 mg at 01/19/22 9509  ? ? ? ?Discharge Medications: ?Please see discharge summary for a list of discharge medications. ? ?Relevant Imaging Results: ? ?Relevant Lab Results: ? ? ?Additional Information ?SSN: [id number]. Pfizer COVID-19 Vaccine 07/20/2020 , 01/07/2020 , 12/14/2019  Pfizer Covid-19 Vaccine Bivalent Booster 06/29/2021 ? ?Renne Crigler Nakia Koble, LCSW ? ? ? ? ?

## 2022-01-19 NOTE — Discharge Summary (Signed)
Family Medicine Teaching Service ?Hospital Discharge Summary ? ?Patient name: Anna Bernard record number: [id number] ?Date of birth: 16-Feb-1951 Age: 71 y.o. Gender: female ?Date of Admission: 01/16/2022  Date of Discharge: 01/19/2022 ?Admitting Physician: Erskine Emery, MD ? ?Primary Care Provider: [name], MD ?Consultants: Cardiology ? ?Indication for Hospitalization: Fall ? ?Discharge Diagnoses/Problem List:  ?Principal Problem: ?  Fall ?Active Problems: ?  Sepsis ([place]) ?  Parkinson disease ([place]) ?  Non-ST elevation (NSTEMI) myocardial infarction [facility]) ?  Pulmonary embolus ([place]) ?  Altered mental status ?  ? ?Disposition: SNF short term ? ?Discharge Condition: Stable ? ?Discharge Exam:  ?General: NAD, laying in bed comfortably, Alert and oriented ?Cardiovascular: RRR no murmurs rubs or gallops ?Respiratory: Clear to auscultation bilaterally no wheezes rales or crackles, no iWOB ?Abdomen: Nontender to palpation, soft ?Extremities: No lower extremity edema ?  ?Brief Hospital Course:  ?Anna Bernard is a 71 y.o.female with a history of hypothyroidism, HTN, HLD, T2DM, microcytic anemia on oral iron, vit B12 deficiency, venous insufficiency, Parkinson's, chronic low back pain who was admitted to the [facility] Medicine Teaching Service at [facility] for recurrent falls. Her hospital course is detailed below: ? ?Recurrent Falls  ?New Dizziness  ?Mechanical Fall  Possible concussion ?Multiple episodes of falls in the past, dizziness prior to most recent fall complicated by bathrooms that prevent her from using her walker due to lack of space. Patient fell and hit her head, then had syncopal event after fall. Pt had a GCS of 8 initially, and met sepsis criteria (see below). CT head showed no acute hemorrhage and patient was monitored for changes in mental status throughout hospitalization. She quickly returned to baseline. Appears that fall could be secondary to advancing parkinson's disease with possible component  of spinal cord impingement.  Cardiology was consulted and obtained updated echo that showed EF 60 to 65%, mild LVH,  L atrium mod dilated, not changed from previously.  PT and OT recommended inpatient rehab but was screened out, patient was amenable from short term SNF.  New onset dizziness that was not present in previous falls could very likely be in the setting of A-fib, patient spontaneously converted to NSR.  ? ?A-fib with RVR ?She does not appear the patient had a history of A-fib prior to admission.  Patient's rate was initially in the 140s in A-fib.  Heparin drip started in the ED, started amiodarone load but then discontinued this shortly after.  Patient converted to normal sinus rhythm prior to echo being done. Echo showed EF 60 to 65%, mild LVH,  L atrium mod dilated, not changed from previously. Coronary CTA ordered and showed nonobstructive CAD and PE discussed below. Eliquis was continued at discharge. Discussed risks and benefits with patient about anticoagulation in depth.  ? ?Elevated Troponin (Demand ischemia) ?52>>553 initially. Likely 2/2 to demand ischemia in setting of rapid Afib. ? ?Nonocclusive R Sided PE ?Visualized on coronary CTA and confirmed via radiology. Vitals remained stable, no tachcardia or hypoxia. Patient did not have any chest pain or shortness of breath.  Does have possible PFO on echocardiogram. Eliquis 10 mg BID for 7 days given. ? ?UTI sepsis ?Patient met sepsis criteria with tachypnea, tachycardia, hypotension and known source of infection.  UA showed many bacteria and leukocytes, started treatment with empiric antibiotics given sepsis cefepime, metronidazole, vancomycin.  De-escalated appropriately and urine culture showed >100,000 colonies of E coli-ESBL. Was given CTX x1 and fosfomycin x 1 after sensitivities returned. ? ? ?Other chronic  conditions were medically managed with home medications and formulary alternatives as necessary (hyperlipidemia, hypothyroidism,  hypertension, Parkinson's disease, chronic lower back pain, chronic constipation) ? ? ?Issues for Follow Up:  ?TSH elevated to 6.892, PCP to recheck and adjust levothyroxine ?Ensure completion of treatment dose of Eliquis in 7 days. Then back to 5 mg BID. Monitor with hx of recurrent falls/parkinson's ?Cardiology follow up ? ?Significant Procedures:  ? ?Significant Labs and Imaging:  ?Recent Labs  ?Lab 01/17/22 ?0234 01/18/22 ?0113 01/19/22 ?0252  ?WBC 7.9 6.2 4.9  ?HGB 9.0* 9.6* 9.5*  ?HCT 26.5* 28.2* 28.6*  ?PLT 272 295 285  ? ?Recent Labs  ?Lab 01/16/22 ?0522 01/17/22 ?0234 01/18/22 ?0113 01/19/22 ?8185  ?NA 141 141 141 141  ?K 3.9 3.8 3.6 3.8  ?CL 108 108 107 107  ?CO2 _0 ?GLUCOSE 122* 74 89 96  ?BUN 25* _1 ?CREATININE 1.08* 0.59 0.67 0.73  ?CALCIUM 8.8* 8.0* 8.1* 8.2*  ?ALKPHOS 49  --   --   --   ?AST 24  --   --   --   ?ALT <5  --   --   --   ?ALBUMIN 3.3*  --   --   --   ? ? ? ? ?Results/Tests Pending at Time of Discharge:  ? ?Discharge Medications:  ?Allergies as of 01/19/2022   ?No Known Allergies ?  ? ?  ?Medication List  ?  ? ?STOP taking these medications   ? ?furosemide 20 MG tablet ?Commonly known as: LASIX ?  ?Klor-Con M20 20 MEQ tablet ?Generic drug: potassium chloride SA ?  ?naproxen 500 MG tablet ?Commonly known as: NAPROSYN ?  ?naproxen sodium 220 MG tablet ?Commonly known as: ALEVE ?  ?oxyCODONE-acetaminophen 5-325 MG tablet ?Commonly known as: PERCOCET/ROXICET ?  ?predniSONE 20 MG tablet ?Commonly known as: DELTASONE ?  ?trimethoprim 100 MG tablet ?Commonly known as: TRIMPEX ?  ? ?  ? ?TAKE these medications   ? ?acetaminophen 325 MG tablet ?Commonly known as: TYLENOL ?Take 2 tablets (650 mg total) by mouth every 6 (six) hours as needed for mild pain or moderate pain. ?  ?apixaban 5 MG Tabs tablet ?Commonly known as: ELIQUIS ?Take 2 tablets (10 mg total) by mouth 2 (two) times daily for 7 days. Take 10 mg BID for 6 additional days for treatment dose. Then switch to 5 mg BID after  completing treatment course ?  ?apixaban 5 MG Tabs tablet ?Commonly known as: ELIQUIS ?Take 1 tablet (5 mg total) by mouth 2 (two) times daily. Start after treatment course of eliquis completed (10 mg BID eliquis course for 6 additional days) ?  ?Carbidopa-Levodopa ER 25-100 MG tablet controlled release ?Commonly known as: SINEMET CR ?3 at 7am, 2 at 11 am, 2 at 4pm ?  ?diclofenac Sodium 1 % Gel ?Commonly known as: VOLTAREN ?Apply 4 g topically 4 (four) times daily. ?  ?ferrous sulfate 325 (65 Anna) MG tablet ?TAKE 1 TABLET BY MOUTH EVERY DAY WITH BREAKFAST ?What changed: See the new instructions. ?  ?levothyroxine 150 MCG tablet ?Commonly known as: SYNTHROID ?Take 1 tablet (150 mcg total) by mouth daily. ?  ?lidocaine 5 % ?Commonly known as: LIDODERM ?Place 1 patch onto the skin daily. Remove & Discard patch within 12 hours or as directed by MD ?  ?metoprolol tartrate 25 MG tablet ?Commonly known as: LOPRESSOR ?Take 1 tablet (25 mg total) by mouth 2 (two) times daily. ?  ?multivitamin with minerals Tabs  tablet ?Take 1 tablet by mouth daily. ?  ?olmesartan 20 MG tablet ?Commonly known as: BENICAR ?Take 1 tablet (20 mg total) by mouth daily. ?  ?polyethylene glycol powder 17 GM/SCOOP powder ?Commonly known as: GLYCOLAX/MIRALAX ?Take 17 g by mouth 2 (two) times daily as needed for mild constipation or moderate constipation. ?  ?rosuvastatin 10 MG tablet ?Commonly known as: CRESTOR ?Take 1 tablet (10 mg total) by mouth daily. ?  ? ?  ? ? ?Discharge Instructions: Please refer to Patient Instructions section of EMR for full details.  Patient was counseled important signs and symptoms that should prompt return to medical care, changes in medications, dietary instructions, activity restrictions, and follow up appointments.  ? ?Follow-Up Appointments: ? Follow-up Information   ? ? [facility]. Go on 01/24/2022.   ?Why: At 10:30am. You have a hospital follow up appointment with your family medicine clinic  on Monday, 5/08 at 10:30am. Please arrive by 10:15am. ?Contact information: ?[address] ?[place] ?[phone] ? ?  ?  ? ? Care, [facility] Follow up.   ?Specialty: Home H

## 2022-01-19 NOTE — TOC Transition Note (Signed)
Transition of Care (TOC) - CM/SW Discharge Note ? ? ?Patient Details  ?Name: Anna Bernard ?MRN: [id number] ?Date of Birth: Aug 18, 1951 ? ?Transition of Care (TOC) CM/SW Contact:  ?Mearl Latin, LCSW ?Phone Number: ?01/19/2022, 3:42 PM ? ? ?Clinical Narrative:    ?Patient will DC to: [facility] ?Anticipated DC date: 01/19/22 ?Family notified: Daughter and spouse ?Transport by: Sharin Mons  ? ? ?Per MD patient ready for DC to [facility]. RN to call report prior to discharge [phone]). RN, patient, patient's family, and facility notified of DC. Discharge Summary and FL2 sent to facility. DC packet on chart. Ambulance transport requested for patient.  ? ?CSW will sign off for now as social work intervention is no longer needed. Please consult [place] again if new needs arise. ? ? ? ? ?Final next level of care: Skilled Nursing Facility ?Barriers to Discharge: Barriers Resolved ? ? ?Patient Goals and CMS Choice ?Patient states their goals for this hospitalization and ongoing recovery are:: return home ?CMS Medicare.gov Compare Post Acute Care list provided to:: Patient ?Choice offered to / list presented to : Patient, Adult Children, Spouse ? ?Discharge Placement ?PASRR number recieved: 01/19/22 ?           ?Patient chooses bed at: [facility] ?Patient to be transferred to facility by: [facility] ?Name of family member notified: Spouse and daughter ?Patient and family notified of of transfer: 01/19/22 ? ?Discharge Plan and Services ?In-house Referral: Clinical Social Work ?Discharge Planning Services: CM Consult ?Post Acute Care Choice: Skilled Nursing Facility          ?  ?  ?  ?  ?  ?HH Arranged: PT, OT ?HH Agency: [facility] ?Date HH Agency Contacted: 01/17/22 ?Time HH Agency Contacted: 1518 ?Representative spoke with at [facility] Agency: Kandee Keen ? ?Social Determinants of Health (SDOH) Interventions ?  ? ? ?Readmission Risk Interventions ?   ? View : No data to display.  ?  ?  ?  ? ? ? ? ? ?

## 2022-01-19 NOTE — Progress Notes (Addendum)
Family Medicine Teaching Service ?Daily Progress Note ?Intern Pager: [phone] ? ?Patient name: Anna Bernard record number: [id number] ?Date of birth: [birth year] Age: 71 y.o. Gender: female ? ?Primary Care Provider: [name], MD ?Consultants: Cardiology ?Code Status: Full ? ?Pt Overview and Major Events to Date:  ?4/30 admitted ?5/1 EF 60-65% ? ?Assessment and Plan: ? ?Anna Bernard is a 71 year old female who presented with a fall and to onset A-fib with RVR that has spontaneously converted to NSR.  Met sepsis criteria in the ED and was started on antibiotics.  Past medical history significant for hypothyroidism, HTN, HLD T2DM, microcytic anemia on oral iron, vitamin B12 deficiency, venous insufficiency, Parkinson's, chronic lower back pain. Stable to D/C to SNF ? ?Fall Hx, likely mechanical ?PD with recurrent falls. BP ranges of 100-143/57-75. Alert and oriented. ?-repeat CT head if mental status worsens ?-PT recommends CIR (denied), pursuign short term rehab ? ?Nonocclusive R Sided PE ?Visualized on coronary CTA yesterday and confirmed via radiology. Vitals have been stable, no tachcardia or hypoxia. Patient denies any chest pain or shortness of breath.  Does have possible PFO on echocardiogram. ?-Elqiuis 10 mg BID for 7 days ?-CTA chest if vital signs change ? 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 No ulcers/joint warmth/swelling ?-Outpatient ? ?Elevated troponin when admitted ?Echo unchanged from previously. Coronary CTA showed non-obstructive CAD but did have PE as described above. Denies any CP. ?-Appreciate cardiology recs ? ?New onset Afib RVR > NSR ?CHADS2-VASc of 3. Risk-benefit of falls/bleeds discussed with patient, likely needs to be on anticoagulation due to PE and PFO possibility on Echo. ?-Elqiuis  ?-metoprolol ? ?UTI ?Initially met sepsis criteria, no longer septic. Has remained afebrile. Ucx grew Ecoli. Was given fosfomycin yeseterday. Denies any dysuria/abdominal pain. ?-monitor for  symptoms/fever curve ? ?Concussion ?On exam A&O ?-monitor ?-repeat head CT if mental status worsens ? ?Chronic/stable ?L shoulder pain-CR without acute issues, lidociaine patch ?HLD-home crestor ?Hypothyroidism-TSH 6.892-continue synthroid 150 mg, outpatient follow up ?PD-neurology f/u, home sinemet ?Lower back pain chronic-lidocaine patch ?Chronic constipation-controlled on miralax, no abdominal pain ? ?FEN/GI: heart healthy ?PPx: eliquis ?Dispo:SNF short-term, stable for D/C ? ?Subjective:  ?Patient denies any shortness of breath or chest pain, has not had any hemoptysis. ? ?Objective: ?Temp:  [97.7 ?F (36.5 ?C)-98.4 ?F (36.9 ?C)] 98.4 ?F (36.9 ?C) (05/03 0400) ?Pulse Rate:  [64-78] 69 (05/03 0400) ?Resp:  [15-23] 17 (05/03 0400) ?BP: (100-143)/(57-75) 143/71 (05/03 0400) ?SpO2:  [95 %-100 %] 99 % (05/03 0400) ?Weight:  [73.9 kg] 73.9 kg (05/03 0327) ?Physical Exam: ?General: NAD, laying in bed comfortably ?Cardiovascular: RRR no murmurs rubs or gallops ?Respiratory: Clear to auscultation bilaterally no wheezes rales or crackles ?Abdomen: Nontender to palpation, soft ?Extremities: No lower extremity edema ? ?Laboratory: ?Recent Labs  ?Lab 01/17/22 ?0234 01/18/22 ?0113 01/19/22 ?0252  ?WBC 7.9 6.2 4.9  ?HGB 9.0* 9.6* 9.5*  ?HCT 26.5* 28.2* 28.6*  ?PLT 272 295 285  ? ?Recent Labs  ?Lab 01/16/22 ?0522 01/17/22 ?0234 01/18/22 ?0113 01/19/22 ?0998  ?NA 141 141 141 141  ?K 3.9 3.8 3.6 3.8  ?CL 108 108 107 107  ?CO2 _0 ?BUN 25* _1 ?CREATININE 1.08* 0.59 0.67 0.73  ?CALCIUM 8.8* 8.0* 8.1* 8.2*  ?PROT 6.4*  --   --   --   ?BILITOT 0.6  --   --   --   ?ALKPHOS 49  --   --   --   ?ALT <5  --   --   --   ?  AST 24  --   --   --   ?GLUCOSE 122* 74 89 96  ? ? ? ? ?Imaging/Diagnostic Tests: ? ? ?Gerrit Heck, MD ?01/19/2022, 5:57 AM ?PGY-1, [place] [facility] ?[place] Intern pager: [phone], text pages welcome  ?

## 2022-01-19 NOTE — Progress Notes (Signed)
? ?Progress Note ? ?Patient Name: Anna Bernard ?Date of Encounter: 01/19/2022 ? ?[facility] Cardiologist: Fransico Him, MD  ? ?Subjective  ? ?Feeling well.  She did not sleep well last night.   ? ?Inpatient Medications  ?  ?Scheduled Meds: ? apixaban  5 mg Oral BID  ? Carbidopa-Levodopa ER  2 tablet Oral BID  ? And  ? Carbidopa-Levodopa ER  3 tablet Oral Q24H  ? irbesartan  150 mg Oral Daily  ? levothyroxine  150 mcg Oral Daily  ? lidocaine  1 patch Transdermal Q24H  ? metoprolol tartrate  25 mg Oral BID  ? rosuvastatin  10 mg Oral Daily  ? ?Continuous Infusions: ? ?PRN Meds: ?acetaminophen, polyethylene glycol  ? ?Vital Signs  ?  ?Vitals:  ? 01/19/22 0300 01/19/22 0327 01/19/22 0400 01/19/22 0849  ?BP:   (!) 143/71 (!) 143/63  ?Pulse: 71 64 69 66  ?Resp: 19 20 17 14   ?Temp:   98.4 ?F (36.9 ?C) 98.3 ?F (36.8 ?C)  ?TempSrc:   Oral Oral  ?SpO2: 98% 98% 99% 100%  ?Weight:  73.9 kg    ?Height:      ? ? ?Intake/Output Summary (Last 24 hours) at 01/19/2022 0855 ?Last data filed at 01/19/2022 [id number] ?Gross per 24 hour  ?Intake 240 ml  ?Output 1300 ml  ?Net -1060 ml  ? ? ?  01/19/2022  ?  3:27 AM 01/18/2022  ?  5:00 AM 01/17/2022  ?  4:00 AM  ?Last 3 Weights  ?Weight (lbs) 162 lb 14.7 oz 162 lb 7.7 oz 163 lb  ?Weight (kg) 73.9 kg 73.7 kg 73.936 kg  ?   ? ?Telemetry  ?  ?Sinus rhythm.  No events - Personally Reviewed ? ?ECG  ?  ?N/a - Personally Reviewed ? ?Physical Exam  ? ?VS:  BP (!) 143/63 (BP Location: Right Arm)   Pulse 66   Temp 98.3 ?F (36.8 ?C) (Oral)   Resp 14   Ht 5\' 1"  (1.549 m)   Wt 73.9 kg   SpO2 100%   BMI 30.78 kg/m?  , BMI Body mass index is 30.78 kg/m?. ?GENERAL:  Well appearing ?HEENT: Pupils equal round and reactive, fundi not visualized, oral mucosa unremarkable ?NECK:  No jugular venous distention, waveform within normal limits, carotid upstroke brisk and symmetric, no bruits, no thyromegaly ?LUNGS:  Clear to auscultation bilaterally ?HEART:  RRR.  PMI not displaced or sustained,S1 and S2 within normal  limits, no S3, no S4, no clicks, no rubs, no murmurs ?ABD:  Flat, positive bowel sounds normal in frequency in pitch, no bruits, no rebound, no guarding, no midline pulsatile mass, no hepatomegaly, no splenomegaly ?EXT:  2 plus pulses throughout, no edema, no cyanosis no clubbing ?SKIN:  No rashes no nodules ?NEURO:  Cranial nerves II through XII grossly intact, motor grossly intact throughout ?PSYCH:  Cognitively intact, oriented to person place and time ? ?Labs  ?  ?High Sensitivity Troponin:   ?Recent Labs  ?Lab 01/16/22 ?0522 01/16/22 ?[id number] 01/16/22 ?1448 01/16/22 ?1632  ?TROPONINIHS 6 52* 553* 551*  ?   ?Chemistry ?Recent Labs  ?Lab 01/16/22 ?0522 01/17/22 ?0234 01/18/22 ?0113 01/19/22 ?[id number]  ?NA 141 141 141 141  ?K 3.9 3.8 3.6 3.8  ?CL 108 108 107 107  ?CO2 28 29 27 27   ?GLUCOSE 122* 74 89 96  ?BUN 25* 13 10 12   ?CREATININE 1.08* 0.59 0.67 0.73  ?CALCIUM 8.8* 8.0* 8.1* 8.2*  ?PROT 6.4*  --   --   --   ?  ALBUMIN 3.3*  --   --   --   ?AST 24  --   --   --   ?ALT <5  --   --   --   ?ALKPHOS 49  --   --   --   ?BILITOT 0.6  --   --   --   ?GFRNONAA 56* >60 >60 >60  ?ANIONGAP 5 4* 7 7  ?  ?Lipids  ?Recent Labs  ?Lab 01/17/22 ?0234  ?CHOL 133  ?TRIG 65  ?HDL 48  ?[place] 72  ?CHOLHDL 2.8  ?  ?Hematology ?Recent Labs  ?Lab 01/17/22 ?0234 01/18/22 ?0113 01/19/22 ?0252  ?WBC 7.9 6.2 4.9  ?RBC 3.56* 3.84* 3.86*  ?HGB 9.0* 9.6* 9.5*  ?HCT 26.5* 28.2* 28.6*  ?MCV 74.4* 73.4* 74.1*  ?MCH 25.3* 25.0* 24.6*  ?MCHC 34.0 34.0 33.2  ?RDW 15.0 14.8 14.9  ?PLT 272 295 285  ? ?Thyroid  ?Recent Labs  ?Lab 01/17/22 ?0234  ?TSH 6.892*  ?  ?BNPNo results for input(s): BNP, PROBNP in the last 168 hours.  ?DDimer No results for input(s): DDIMER in the last 168 hours.  ? ?Radiology  ?  ?CT CORONARY MORPH W/CTA COR W/SCORE W/CA W/CM &/OR WO/CM ? ?Addendum Date: 01/18/2022   ?ADDENDUM REPORT: 01/18/2022 15:07 CLINICAL DATA:  This is a 71 year old female with chest pain. EXAM: Cardiac/Coronary  CTA TECHNIQUE: The patient was scanned on a  [employer]. FINDINGS: A 100 kV prospective scan was triggered in the descending thoracic aorta at 111 HU's. Axial non-contrast 3 mm slices were carried out through the heart. The data set was analyzed on a dedicated work station and scored using the [place]. Gantry rotation speed was 250 msecs and collimation was .6 mm. No beta blockade and 0.8 mg of sl NTG was given. The 3D data set was reconstructed in 5% intervals of the 67-82 % of the R-R cycle. Diastolic phases were analyzed on a dedicated work station using MPR, MIP and VRT modes. The patient received 80 cc of contrast. Aorta: Normal size.  Aortic root calcifications.  No dissection. Aortic Valve:  Trileaflet.  Mild calcifications. Coronary Arteries:  Normal coronary origin.  Right dominance. RCA is a large dominant artery that gives rise to PDA and PLA. There is no plaque. Left main is a large artery that gives rise to LAD and LCX arteries. LAD is a large vessel that has no plaque. LCX is a non-dominant artery that gives rise to one large OM1 branch. There is mild (25-49%) calcified plaque in the proximal LCX. The mid and distal LCX with no plaques. Coronary Calcium Score: Left main: 0 Left anterior descending artery: 0 Left circumflex artery: 212 Right coronary artery: 0 Total: 212 Percentile: 84 Other findings: Normal pulmonary vein drainage into the left atrium. Normal left atrial appendage without a thrombus. Normal size of the pulmonary artery. Moderate mitral annular calcification. IMPRESSION: 1. Coronary calcium score of 212. This was 30 percentile for age and sex matched control. 2. Normal coronary origin with right dominance. 3. CAD-RADS 2. Mild non-obstructive CAD (25-49%). Consider non-atherosclerotic causes of chest pain. Consider preventive therapy and risk factor modification. 4. Moderate mitral annular calcification. The noncardiac portion of this study will be interpreted in separate report by the radiologist. Electronically  Signed   By: Berniece Salines D.O.   On: 01/18/2022 15:07  ? ?Result Date: 01/18/2022 ?EXAM: OVER-READ INTERPRETATION  CT CHEST The following report is an over-read performed by radiologist Dr.  Aletta Edouard of [facility], [place] on 01/18/2022. This over-read does not include interpretation of cardiac or coronary anatomy or pathology. The coronary CTA interpretation by the cardiologist is attached. COMPARISON:  None Available. FINDINGS: Vascular: Positive for right-sided pulmonary embolism with thrombus in the distal right pulmonary artery extending into right lower lobe segmental branches. Thrombus is nonocclusive. The entire pulmonary artery tree is not visualized. Mediastinum/Nodes: Visualized mediastinum and hilar regions demonstrate no lymphadenopathy or masses. Lungs/Pleura: Visualized lungs show no evidence of pulmonary edema, consolidation, pneumothorax or nodule. Trace bilateral pleural fluid. Upper Abdomen: No acute abnormality. Musculoskeletal: No chest wall mass or suspicious bone lesions identified. IMPRESSION: Nonocclusive right-sided pulmonary embolism. This is potentially not completely visualized but appears to be nonocclusive at the level of the distal right pulmonary artery and extending into lower lobe segmental branches. Consider full CTA of the chest with more optimal pulmonary artery opacification. Electronically Signed: By: Aletta Edouard M.D. On: 01/18/2022 14:33  ? ?ECHOCARDIOGRAM COMPLETE ? ?Result Date: 01/17/2022 ?   ECHOCARDIOGRAM REPORT   Patient Name:   KAYMAN RUBOTTOM Date of Exam: 01/17/2022 Medical Rec #:  [id number]      Height:       61.0 in Accession #:    [id number]     Weight:       163.0 lb Date of Birth:  [birth year]       BSA:          1.731 m? Patient Age:    34 years       BP:           155/74 mmHg Patient Gender: F              HR:           76 bpm. Exam Location:  Inpatient Procedure: 2D Echo, 3D Echo, Cardiac Doppler and Color Doppler Indications:    R07.9* Chest pain,  unspecified. Elevated troponin  History:        Patient has prior history of Echocardiogram examinations, most                 recent 09/30/2021. Abnormal ECG, Arrythmias:Atrial Fibrillation,                 Signs/Sympto

## 2022-01-19 NOTE — Progress Notes (Signed)
FPTS Brief Progress Note ? ?S: resting comfortably in bed. ? ? ?O: ?BP 111/60 (BP Location: Right Arm)   Pulse 73   Temp 98.4 ?F (36.9 ?C) (Oral)   Resp 17   Ht 5\' 1"  (1.549 m)   Wt 73.7 kg   SpO2 99%   BMI 30.70 kg/m?   ? ? ?A/P: ?- Orders reviewed. Labs for AM ordered, which was adjusted as needed.  ?- If condition changes, plan includes to obtain CTA chest to fully work up PE found on CTA coronary. So far patient has been stable, on eliquis since 4/30, VSS.  ?- See daily progress note for full details of plan ? ?5/30, MD ?01/19/2022, 1:34 AM ?PGY-3, [facility] Night Resident  ?Please page [phone] with questions.  ? ? ?

## 2022-01-19 NOTE — Progress Notes (Signed)
Occupational Therapy Treatment ?Patient Details ?Name: Anna Bernard ?MRN: [id number] ?DOB: 06/03/1951 ?Today's Date: 01/19/2022 ? ? ?History of present illness 71 y.o. female presenting with frequent falls due to known Parkinson's Disease. Fall 4/30 with LOC and concussion; +afib RVR in ED; shoulder pain with negative xray; +UTI, sepsis; elevated troponin with Cardiology consult feels demand ischemia due to afib RVR ?  ?OT comments ? Pt highly motivated to mobilize OOB as she wants to return home soon. Min assist for bed mobility, min assist to stand from elevated surfaces. Completed seated grooming with supervision. Needed assist for pericare in standing as she is heavily dependent on UEs for standing balance. Pt encouraged by today's session, stated she struggled in PT yesterday. Agreeable to ST rehab in SNF, updated d/c recommendation.   ? ?Recommendations for follow up therapy are one component of a multi-disciplinary discharge planning process, led by the attending physician.  Recommendations may be updated based on patient status, additional functional criteria and insurance authorization. ?   ?Follow Up Recommendations ? Skilled nursing-short term rehab (<3 hours/day)  ?  ?Assistance Recommended at Discharge Frequent or constant Supervision/Assistance  ?Patient can return home with the following ? A little help with walking and/or transfers;Assistance with cooking/housework;Assist for transportation;Direct supervision/assist for medications management;A lot of help with bathing/dressing/bathroom ?  ?Equipment Recommendations ? None recommended by OT  ?  ?Recommendations for Other Services   ? ?  ?Precautions / Restrictions Precautions ?Precautions: Fall ?Precaution Comments: hx of Parkinsons ?Restrictions ?Weight Bearing Restrictions: No  ? ? ?  ? ?Mobility Bed Mobility ?Overal bed mobility: Needs Assistance ?Bed Mobility: Supine to Sit ?  ?  ?Supine to sit: Min assist ?Sit to supine: Min assist ?  ?  ?General  bed mobility comments: assist for L UE to reach rail and for hips to EOB and assist for LEs back into bed, increased time ?  ? ?Transfers ?Overall transfer level: Needs assistance ?Equipment used: Rolling walker (2 wheels) ?Transfers: Sit to/from Stand ?Sit to Stand: Mod assist, Min assist, From elevated surface ?  ?  ?  ?  ?  ?General transfer comment: performed x 3, assist to rise ?  ?  ?Balance Overall balance assessment: Needs assistance ?  ?Sitting balance-Leahy Scale: Fair ?  ?  ?Standing balance support: Bilateral upper extremity supported ?Standing balance-Leahy Scale: Poor ?  ?  ?  ?  ?  ?  ?  ?  ?  ?  ?  ?  ?   ? ?ADL either performed or assessed with clinical judgement  ? ?ADL Overall ADL's : Needs assistance/impaired ?  ?  ?Grooming: Wash/dry hands;Wash/dry face;Oral care;Sitting;Supervision/safety ?  ?  ?  ?  ?  ?  ?  ?  ?  ?Toilet Transfer: Minimal assistance;Stand-pivot;BSC/3in1;Rolling walker (2 wheels) ?  ?Toileting- Clothing Manipulation and Hygiene: Total assistance;Sit to/from stand ?  ?  ?  ?  ?General ADL Comments: pt heavily dependent on UEs to maintain standing, assisted for pericare ?  ? ?Extremity/Trunk Assessment   ?  ?  ?  ?  ?  ? ?Vision   ?  ?  ?Perception   ?  ?Praxis   ?  ? ?Cognition Arousal/Alertness: Awake/alert ?Behavior During Therapy: [facility] for tasks assessed/performed ?Overall Cognitive Status: History of cognitive impairments - at baseline ?  ?  ?  ?  ?  ?  ?  ?  ?  ?  ?  ?  ?  ?  ?  ?  ?  General Comments: slow processing ?  ?  ?   ?Exercises   ? ?  ?Shoulder Instructions   ? ? ?  ?General Comments    ? ? ?Pertinent Vitals/ Pain       Pain Assessment ?Pain Assessment: No/denies pain ? ?Home Living   ?  ?  ?  ?  ?  ?  ?  ?  ?  ?  ?  ?  ?  ?  ?  ?  ?  ?  ? ?  ?Prior Functioning/Environment    ?  ?  ?  ?   ? ?Frequency ? Min 2X/week  ? ? ? ? ?  ?Progress Toward Goals ? ?OT Goals(current goals can now be found in the care plan section) ? Progress towards OT goals: Progressing toward  goals ? ?Acute Rehab OT Goals ?OT Goal Formulation: With patient/family ?Time For Goal Achievement: 01/31/22 ?Potential to Achieve Goals: Good  ?Plan Discharge plan needs to be updated   ? ?Co-evaluation ? ? ?   ?  ?  ?  ?  ? ?  ?AM-PAC OT "6 Clicks" Daily Activity     ?Outcome Measure ? ? Help from another person eating meals?: None ?Help from another person taking care of personal grooming?: A Little ?Help from another person toileting, which includes using toliet, bedpan, or urinal?: A Lot ?Help from another person bathing (including washing, rinsing, drying)?: A Lot ?Help from another person to put on and taking off regular upper body clothing?: A Little ?Help from another person to put on and taking off regular lower body clothing?: Total ?6 Click Score: 15 ? ?  ?End of Session Equipment Utilized During Treatment: Gait belt;Rolling walker (2 wheels) ? ?OT Visit Diagnosis: Unsteadiness on feet (R26.81);Muscle weakness (generalized) (M62.81);Repeated falls (R29.6) ?  ?Activity Tolerance Patient tolerated treatment well ?  ?Patient Left in bed;with call bell/phone within reach ?  ?Nurse Communication   ?  ? ?   ? ?Time: 7209-4709 ?OT Time Calculation (min): 31 min ? ?Charges: OT General Charges ?$OT Visit: 1 Visit ?OT Treatments ?$Self Care/Home Management : 23-37 mins ? ?Martie Round, OTR/L ?Acute Rehabilitation Services ?Pager: [phone] ?Office: [phone]  ? ?[name] ?01/19/2022, 2:24 PM ?

## 2022-01-19 NOTE — Progress Notes (Signed)
Report called to [place] at [place]. All questions answered.  ?

## 2022-01-20 ENCOUNTER — Telehealth: Payer: Self-pay

## 2022-01-20 ENCOUNTER — Ambulatory Visit: Payer: Medicare Other | Admitting: Occupational Therapy

## 2022-01-20 ENCOUNTER — Ambulatory Visit: Payer: Medicare Other | Admitting: Physical Therapy

## 2022-01-20 NOTE — Telephone Encounter (Signed)
Left pt message informing her that the patches prescribed are available OTC ?

## 2022-01-21 DIAGNOSIS — M6281 Muscle weakness (generalized): Secondary | ICD-10-CM | POA: Diagnosis not present

## 2022-01-21 DIAGNOSIS — G4700 Insomnia, unspecified: Secondary | ICD-10-CM | POA: Diagnosis not present

## 2022-01-21 DIAGNOSIS — M25552 Pain in left hip: Secondary | ICD-10-CM | POA: Diagnosis not present

## 2022-01-21 DIAGNOSIS — R2689 Other abnormalities of gait and mobility: Secondary | ICD-10-CM | POA: Diagnosis not present

## 2022-01-21 DIAGNOSIS — Z9181 History of falling: Secondary | ICD-10-CM | POA: Diagnosis not present

## 2022-01-21 LAB — CULTURE, BLOOD (ROUTINE X 2)
Culture: NO GROWTH
Culture: NO GROWTH
Special Requests: ADEQUATE
Special Requests: ADEQUATE

## 2022-01-24 ENCOUNTER — Ambulatory Visit: Payer: Medicare Other

## 2022-01-24 DIAGNOSIS — F5101 Primary insomnia: Secondary | ICD-10-CM | POA: Diagnosis not present

## 2022-01-24 NOTE — Progress Notes (Deleted)
   SUBJECTIVE:   CHIEF COMPLAINT / HPI:   No chief complaint on file.    Anna Bernard is a 71 y.o. female here for ***   Pt reports ***    PERTINENT  PMH / PSH: reviewed and updated as appropriate   OBJECTIVE:   There were no vitals taken for this visit.  ***  ASSESSMENT/PLAN:   No problem-specific Assessment & Plan notes found for this encounter.     Katha Cabal, DO PGY-3, [facility] 01/24/2022      {    This will disappear when note is signed, click to select method of visit    :1}

## 2022-01-24 NOTE — Telephone Encounter (Signed)
NA

## 2022-01-25 ENCOUNTER — Encounter: Payer: Medicare Other | Admitting: Occupational Therapy

## 2022-01-25 ENCOUNTER — Ambulatory Visit: Payer: Medicare Other | Admitting: Physical Therapy

## 2022-01-26 ENCOUNTER — Ambulatory Visit: Payer: Medicare Other | Admitting: Podiatry

## 2022-01-26 DIAGNOSIS — M6281 Muscle weakness (generalized): Secondary | ICD-10-CM | POA: Diagnosis not present

## 2022-01-26 DIAGNOSIS — K5900 Constipation, unspecified: Secondary | ICD-10-CM | POA: Diagnosis not present

## 2022-01-26 DIAGNOSIS — Z9181 History of falling: Secondary | ICD-10-CM | POA: Diagnosis not present

## 2022-01-26 DIAGNOSIS — W19XXXA Unspecified fall, initial encounter: Secondary | ICD-10-CM | POA: Diagnosis not present

## 2022-01-26 DIAGNOSIS — R2689 Other abnormalities of gait and mobility: Secondary | ICD-10-CM | POA: Diagnosis not present

## 2022-01-27 DIAGNOSIS — K5900 Constipation, unspecified: Secondary | ICD-10-CM | POA: Diagnosis not present

## 2022-01-27 DIAGNOSIS — G4700 Insomnia, unspecified: Secondary | ICD-10-CM | POA: Diagnosis not present

## 2022-02-01 ENCOUNTER — Ambulatory Visit: Payer: Medicare Other | Admitting: Physical Therapy

## 2022-02-01 ENCOUNTER — Encounter: Payer: Medicare Other | Admitting: Occupational Therapy

## 2022-02-01 DIAGNOSIS — M545 Low back pain, unspecified: Secondary | ICD-10-CM | POA: Diagnosis not present

## 2022-02-01 DIAGNOSIS — K5900 Constipation, unspecified: Secondary | ICD-10-CM | POA: Diagnosis not present

## 2022-02-02 DIAGNOSIS — Z7689 Persons encountering health services in other specified circumstances: Secondary | ICD-10-CM | POA: Diagnosis not present

## 2022-02-02 DIAGNOSIS — K5900 Constipation, unspecified: Secondary | ICD-10-CM | POA: Diagnosis not present

## 2022-02-03 ENCOUNTER — Ambulatory Visit: Payer: Medicare Other | Admitting: Physical Therapy

## 2022-02-03 ENCOUNTER — Encounter: Payer: Medicare Other | Admitting: Occupational Therapy

## 2022-02-06 ENCOUNTER — Encounter: Payer: Self-pay | Admitting: Physician Assistant

## 2022-02-06 NOTE — Progress Notes (Unsigned)
Cardiology Office Note    Date:  02/08/2022   ID:  Anna Bernard, DOB [birth year], MRN [id number]  PCP:  [name], MD  Cardiologist:  Fransico Him, MD  Electrophysiologist:  None   Chief Complaint: f/u afib  History of Present Illness:   Anna Bernard is a 71 y.o. female with history of hyperlipidemia (followed by PCP), hypertension, prediabetes, Parkinson's disease, vitamin B12 deficiency, microcytic anemia, chronic low back pain and multiple falls recently admitted for falls/dizziness found to have AKI in setting of sepsis 2nd to UTI, new onset AF RVR, elevated troponin, and PE. From afib standpoint, she spontaneously converted to NSR on her own. Troponin peaked at 553 prompting coronary CTA that showed only mild CAD but did incidentally pick up nonocclusive right sided PE. 2D echo 01/17/22 EF 60-65%, mild HK of left ventricular, basal inferolateral wall, moderate MAC, aortic sclerosis without stenosis, cannot exclude PFO; no visual change from prior. She was treated with VTE dosing Eliquis at discharge. She required discharge to [facility] for rehab.  She is seen for post-hospital follow-up today accompanied by her husband. She denies any recent cardiac symptoms. She continues to have some generalized weakness. She does not report having started any rehab exercises at her facility yet. Husband reports longstanding history of dependent lower extremity edema (R>L) that has historically been responsive to compression hose. Husband states as long as she doesn't have compression on, it quickly comes back. She saw [facility] for this issue many years ago. Deep reflux with possible component of lymphedema was suspected at that time per notes and conservative measures were advised.  Labwork independently reviewed: 12/2021 K 3.9, CR 1.08, albumin 3.3, Hgb 9.5, plt 285, LDL 72, trig 65, TSH 6.892   Cardiology Studies:   Studies reviewed are outlined and summarized above. Reports included below  if pertinent.   2D echo 01/17/22   1. Left ventricular ejection fraction, by estimation, is 60 to 65%. The  left ventricle has normal function. The left ventricle demonstrates  regional wall motion abnormalities (see scoring diagram/findings for  description). There is mild left ventricular   hypertrophy. Left ventricular diastolic parameters were normal. There is  mild hypokinesis of the left ventricular, basal inferolateral wall.   2. Right ventricular systolic function is normal. The right ventricular  size is normal.   3. Left atrial size was moderately dilated.   4. The mitral valve is normal in structure. Trivial mitral valve  regurgitation. No evidence of mitral stenosis. Moderate mitral annular  calcification.   5. The aortic valve is normal in structure. There is mild calcification  of the aortic valve. Aortic valve regurgitation is trivial. Aortic valve  sclerosis is present, with no evidence of aortic valve stenosis.   6. The inferior vena cava is normal in size with <50% respiratory  variability, suggesting right atrial pressure of 8 mmHg.   7. Cannot exclude a small PFO.   Comparison(s): No significant change from prior study. Prior images  reviewed side by side. Mild hypokinesis of basal inferolateral wall seen  on current and prior study, no visual change between studies.   Cor CT - see EMR for report    Past Medical History:  Diagnosis Date   Anemia, unspecified    Hyperlipidemia, unspecified    Hypertension    Hypothyroidism, unspecified    Mild CAD    PAF (paroxysmal atrial fibrillation) (HCC)    Pre-diabetes    Pulmonary emboli (HCC)    Vitamin  B12 deficiency     Past Surgical History:  Procedure Laterality Date   ABDOMINAL HYSTERECTOMY     GIVENS CAPSULE STUDY N/A 04/16/2018   Procedure: GIVENS CAPSULE STUDY;  Surgeon: Carol Ada, MD;  Location: [place];  Service: Endoscopy;  Laterality: N/A;   PARS PLANA VITRECTOMY Right 08/16/2020    Procedure: 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;  Surgeon: Jalene Mullet, MD;  Location: [place];  Service: Ophthalmology;  Laterality: Right;   PARS PLANA VITRECTOMY Right 09/29/2020   Procedure: PARS PLANA VITRECTOMY WITH 25 GAUGE - ENDOCAUTRY;  Surgeon: Jalene Mullet, MD;  Location: [place];  Service: Ophthalmology;  Laterality: Right;   PHOTOCOAGULATION WITH LASER Right 08/16/2020   Procedure: PHOTOCOAGULATION WITH LASER;  Surgeon: Jalene Mullet, MD;  Location: [place];  Service: Ophthalmology;  Laterality: Right;   PHOTOCOAGULATION WITH LASER Right 09/29/2020   Procedure: PHOTOCOAGULATION WITH LASER;  Surgeon: Jalene Mullet, MD;  Location: [place];  Service: Ophthalmology;  Laterality: Right;   SHOULDER SURGERY     TONSILLECTOMY     TUBAL LIGATION      Current Medications: Current Meds  Medication Sig   acetaminophen (TYLENOL) 325 MG tablet Take 2 tablets (650 mg total) by mouth every 6 (six) hours as needed for mild pain or moderate pain.   apixaban (ELIQUIS) 5 MG TABS tablet Take 1 tablet (5 mg total) by mouth 2 (two) times daily. Start after treatment course of eliquis completed (10 mg BID eliquis course for 6 additional days)   Carbidopa-Levodopa ER (SINEMET CR) 25-100 MG tablet controlled release 3 at 7am, 2 at 11 am, 2 at 4pm (Patient taking differently: Taking 2 tablets by mouth daily)   diclofenac Sodium (VOLTAREN) 1 % GEL Apply 4 g topically 4 (four) times daily.   ferrous sulfate 325 (65 FE) MG tablet TAKE 1 TABLET BY MOUTH EVERY DAY WITH BREAKFAST (Patient taking differently: 325 mg daily with breakfast.)   levothyroxine (SYNTHROID) 150 MCG tablet Take 1 tablet (150 mcg total) by mouth daily.   lidocaine (LIDODERM) 5 % Place 1 patch onto the skin daily. Remove & Discard patch within 12 hours or as directed by MD   melatonin 3 MG TABS tablet Take 3 mg by mouth at bedtime.   metoprolol tartrate (LOPRESSOR) 25 MG tablet Take 1 tablet (25 mg total)  by mouth 2 (two) times daily.   Multiple Vitamin (MULTIVITAMIN WITH MINERALS) TABS Take 1 tablet by mouth daily.   olmesartan (BENICAR) 20 MG tablet Take 1 tablet (20 mg total) by mouth daily.   polyethylene glycol powder (GLYCOLAX/MIRALAX) 17 GM/SCOOP powder Take 17 g by mouth 2 (two) times daily as needed for mild constipation or moderate constipation.   rosuvastatin (CRESTOR) 10 MG tablet Take 1 tablet (10 mg total) by mouth daily.   senna (SENOKOT) 8.6 MG TABS tablet Take 2 tablets by mouth daily.      Allergies:   Patient has no known allergies.   Social History   Socioeconomic History   Marital status: Married    Spouse name: Not on file   Number of children: Not on file   Years of education: Not on file   Highest education level: Not on file  Occupational History   Not on file  Tobacco Use   Smoking status: Former   Smokeless tobacco: Never   Tobacco comments:    quit 30+ years ago  Vaping Use   Vaping Use: Never used  Substance and Sexual Activity  Alcohol use: No    Alcohol/week: 0.0 standard drinks   Drug use: No   Sexual activity: Not Currently  Other Topics Concern   Not on file  Social History Narrative   Right handed   Lives with husband in a one story home.   Social Determinants of Health   Financial Resource Strain: Not on file  Food Insecurity: Not on file  Transportation Needs: Not on file  Physical Activity: Not on file  Stress: Not on file  Social Connections: Not on file     Family History:  The patient's family history includes Cancer in her sister; Renal Disease in her sister.  ROS:   Please see the history of present illness. Otherwise, review of systems is positive for chronic hip pain, takes Tylenol for this. No claudication type sx.All other systems are reviewed and otherwise negative.    EKG(s)/Additional Labs   EKG:  EKG is ordered today, personally reviewed, demonstrating NSR 70bpm, no acute STT changes. Inferior Q waves appear  similar to 2021.  Recent Labs: 09/28/2021: BNP 20.1; Magnesium 2.2 01/16/2022: ALT <5 01/17/2022: TSH 6.892 01/19/2022: BUN 12; Creatinine, Ser 0.73; Hemoglobin 9.5; Platelets 285; Potassium 3.8; Sodium 141  Recent Lipid Panel    Component Value Date/Time   CHOL 133 01/17/2022 0234   CHOL 160 06/29/2021 1044   TRIG 65 01/17/2022 0234   HDL 48 01/17/2022 0234   HDL 69 06/29/2021 1044   CHOLHDL 2.8 01/17/2022 0234   VLDL 13 01/17/2022 0234   LDLCALC 72 01/17/2022 0234   LDLCALC 79 06/29/2021 1044    PHYSICAL EXAM:    VS:  BP 110/80   Pulse 70   Ht 5' 1.5" (1.562 m)   Wt 152 lb (68.9 kg) Comment: patient unable to stand wheelchair  SpO2 96%   BMI 28.26 kg/m   BMI: Body mass index is 28.26 kg/m.  GEN: Well nourished, well developed female in no acute distress HEENT: normocephalic, atraumatic Neck: no JVD, or masses. Faint right carotid bruit Cardiac: RRR; no murmurs, rubs, or gallops, nonpitting mild sockline edema  Respiratory:  clear to auscultation bilaterally, normal work of breathing GI: soft, nontender, nondistended, + BS MS: no deformity or atrophy Skin: warm and dry, no rash Neuro:  Alert and Oriented x 3, Strength and sensation are intact, follows commands Psych: euthymic mood, flat affect  Wt Readings from Last 3 Encounters:  02/08/22 152 lb (68.9 kg)  01/19/22 162 lb 14.7 oz (73.9 kg)  01/14/22 158 lb 3.2 oz (71.8 kg)     ASSESSMENT & PLAN:   1. Paroxysmal atrial fibrillation - diagnosed in the hospital recently when acutely ill. Risks vs benefits were reviewed at that time and Dr. Oval Linsey recommended anticoagulation especially in light of her concomitantly diagnosed PE. Therefore she remains on apixaban 14m BID at this time. She is maintaining NSR on metoprolol. No changes made today. Recheck CBC, BMET to ensure stable. I would also suggest she see the nursing home primary care provider to follow up the abnormal thyroid function she had in the hospital.  2.  Mild CAD - not on ASA due to concomitant Eliquis. Continue BB, statin. Lipids managed by PCP.  3. Recent PE - would recommend she see primary care (either at SNF or as outpatient) to determine their recommendations for minimum duration of dosing from PE standpoint, though she will remain on anticoagulation indefinitely for now as tolerated given her history of paroxysmal atrial fibrillation. However, in the event that anticoagulation is  not felt to be safe in the future (I.e. worsening fall risk), it would be helpful to know from PE standpoint when she will have completed the "minimum" duration of adequate treatment.  4. Anemia - recheck CBC today.   5. Possible PFO - this was felt to be an additional risk factor for stroke therefore she is treated with anticoagulation. No indication for closure referral at this time.  6. Lower extremity edema - husband reports this is chronic and has historically responded to compression stockings. Would recommend use of compression stockings when awake at her facility and trying to elevate legs as much as possible. I worry loop diuretic could contribute to increased dizziness/falls so would prefer to manage conservatively if we can for the time being since this has been well controlled with these measures in the past.  7. Right carotid bruit - obtain carotid duplex.    Disposition: F/u with me or Dr. Radford Pax in 3 months.    Medication Adjustments/Labs and Tests Ordered: Current medicines are reviewed at length with the patient today.  Concerns regarding medicines are outlined above. Medication changes, Labs and Tests ordered today are summarized above and listed in the Patient Instructions accessible in Encounters.   Signed, Charlie Pitter, PA-C  02/08/2022 2:20 PM    [place] Phone: [phone]; Fax: [phone]

## 2022-02-07 ENCOUNTER — Ambulatory Visit: Payer: Medicare Other | Admitting: Physician Assistant

## 2022-02-07 NOTE — Progress Notes (Unsigned)
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  This did not show efficacy of levodopa.  However, when I took the patient off of levodopa for 24 hours prior to seeing her today, in anticipation for levodopa challenge test, the patient and her husband reported that she did significantly worse off of the medication.  They would like to stay on the medication.  Daughter present today and we discussed these things in detail  -continue carbidopa/levodopa CR, 3 at 7am, 2 at 11 am and 2 at 4pm.    -We discussed differences between vascular parkinsonism and idiopathic Parkinson's disease.  She expressed understanding.  -Discussed the importance of exercise and physical therapy with this disorder.  [facility].  Told her she needs to continue her therapies at home.  -Letter written to her HOA.  Her husband needs to install a ramp at the house and he has a company that will do it, but the HOA will not approve the ramp.  Letter written.  There should be no issue with this.  Patient is handicapped, needs a wheelchair and needs a way of safely getting in her home.  This is in line with Americans with disabilities act.  2.  Atrial fibrillation  -Patient spontaneously converted back to sinus rhythm, but had syncopal episode associated with dizziness when she had RVR.  -Patient now on Eliquis    Subjective:   Anna Bernard was seen today in follow up for Parkinsons disease.  My previous records were reviewed prior to todays visit as well as outside records available to me. Husband and daughter accompany the pt and supplement the history.  Patient had levodopa challenge test last visit, and it did not show efficacy to levodopa.  However, they thought that she did worse off of levodopa and it was restarted and even slightly increased.  She has been to the hospital twice in April with falls, April 18 and April 30.  With the  fall on April 30, she had hit her head.  It sounds like there may have been a syncopal episode.  She was found to be in atrial fibrillation.  Cardiology felt that her new onset dizziness could have been due to the atrial fibrillation.  Patient had spontaneously converted to sinus rhythm in the hospital.  Eliquis was continued at discharge.  Patient found to have a nonocclusive right sided PE as well as sepsis from urinary tract infection.  Current prescribed movement disorder medications: Carbidopa/levodopa 25/100 CR, 3 tablets at 7 AM/2 tablets at 11 AM/2 at 4pm  PREVIOUS MEDICATIONS: Sinemet  ALLERGIES:  No Known Allergies  CURRENT MEDICATIONS:  Outpatient Encounter Medications as of 02/09/2022  Medication Sig   acetaminophen (TYLENOL) 325 MG tablet Take 2 tablets (650 mg total) by mouth every 6 (six) hours as needed for mild pain or moderate pain.   apixaban (ELIQUIS) 5 MG TABS tablet Take 1 tablet (5 mg total) by mouth 2 (two) times daily. Start after treatment course of eliquis completed (10 mg BID eliquis course for 6 additional days)   Carbidopa-Levodopa ER (SINEMET CR) 25-100 MG tablet controlled release 3 at 7am, 2 at 11 am, 2 at 4pm (Patient taking differently: Taking 2 tablets by mouth daily)   diclofenac Sodium (VOLTAREN) 1 % GEL Apply 4 g topically 4 (four) times daily.   ferrous sulfate 325 (65 FE) MG tablet TAKE 1 TABLET BY MOUTH EVERY DAY WITH BREAKFAST (  Patient taking differently: 325 mg daily with breakfast.)   levothyroxine (SYNTHROID) 150 MCG tablet Take 1 tablet (150 mcg total) by mouth daily.   lidocaine (LIDODERM) 5 % Place 1 patch onto the skin daily. Remove & Discard patch within 12 hours or as directed by MD   melatonin 3 MG TABS tablet Take 3 mg by mouth at bedtime.   metoprolol tartrate (LOPRESSOR) 25 MG tablet Take 1 tablet (25 mg total) by mouth 2 (two) times daily.   Multiple Vitamin (MULTIVITAMIN WITH MINERALS) TABS Take 1 tablet by mouth daily.   olmesartan  (BENICAR) 20 MG tablet Take 1 tablet (20 mg total) by mouth daily.   polyethylene glycol powder (GLYCOLAX/MIRALAX) 17 GM/SCOOP powder Take 17 g by mouth 2 (two) times daily as needed for mild constipation or moderate constipation.   rosuvastatin (CRESTOR) 10 MG tablet Take 1 tablet (10 mg total) by mouth daily.   senna (SENOKOT) 8.6 MG TABS tablet Take 2 tablets by mouth daily.   [DISCONTINUED] apixaban (ELIQUIS) 5 MG TABS tablet Take 2 tablets (10 mg total) by mouth 2 (two) times daily for 7 days. Take 10 mg BID for 6 additional days for treatment dose. Then switch to 5 mg BID after completing treatment course   No facility-administered encounter medications on file as of 02/09/2022.    Objective:   PHYSICAL EXAMINATION:    VITALS:   Vitals:   02/09/22 0953  BP: 122/74  Pulse: 74  SpO2: 98%  Height: 5\' 2"  (1.575 m)      GEN:  The patient appears stated age and is in NAD. HEENT:  Normocephalic, atraumatic.  The mucous membranes are moist. The superficial temporal arteries are without ropiness or tenderness. CV:  RRR Lungs:  CTAB Neck/HEME:  There are no carotid bruits bilaterally.  Neurological examination:  Orientation: The patient is alert and oriented x3. Cranial nerves: There is good facial symmetry with facial hypomimia.  She would intermittently close 1 eye, but stated that she had no double vision.  She stated that she was purposefully closing it because she was sleepy.  The speech is fluent and clear. Soft palate rises symmetrically and there is no tongue deviation. Hearing is intact to conversational tone. Sensation: Sensation is intact to light touch throughout Motor: Strength is at least antigravity x4.   Movement examination: Tone: There is nl tone in the ue/le Abnormal movements: none Coordination:  There is decremation with RAM's, with any form of RAMS, including alternating supination and pronation of the forearm, hand opening and closing, finger taps, heel taps  and toe taps, mostly on the right.  This is the same as last visit. Gait and Station: The patient did not ambulate today.  She was in a wheelchair.  Did not think she could ambulate well today.  I have reviewed and interpreted the following labs independently    Chemistry      Component Value Date/Time   NA 142 02/08/2022 1517   K 4.4 02/08/2022 1517   CL 104 02/08/2022 1517   CO2 25 02/08/2022 1517   BUN 21 02/08/2022 1517   CREATININE 0.83 02/08/2022 1517   CREATININE 0.52 04/21/2013 1526   GLU 133 08/27/2012 0000      Component Value Date/Time   CALCIUM 8.7 02/08/2022 1517   ALKPHOS 49 01/16/2022 0522   AST 24 01/16/2022 0522   ALT <5 01/16/2022 0522   BILITOT 0.6 01/16/2022 0522   BILITOT 0.3 06/29/2021 1044  Lab Results  Component Value Date   WBC 5.4 02/08/2022   HGB 9.3 (L) 02/08/2022   HCT 28.1 (L) 02/08/2022   MCV 78 (L) 02/08/2022   PLT 331 02/08/2022    Lab Results  Component Value Date   TSH 6.892 (H) 01/17/2022    Total time spent on today's visit was 31 minutes, including both face-to-face time and nonface-to-face time.  Time included that spent on review of records (prior notes available to me/labs/imaging if pertinent), discussing treatment and goals, answering patient's questions and coordinating care.    Cc:  Billey Co, MD

## 2022-02-08 ENCOUNTER — Encounter: Payer: Medicare Other | Admitting: Occupational Therapy

## 2022-02-08 ENCOUNTER — Encounter: Payer: Self-pay | Admitting: Physician Assistant

## 2022-02-08 ENCOUNTER — Ambulatory Visit: Payer: Medicare Other | Admitting: Physical Therapy

## 2022-02-08 ENCOUNTER — Ambulatory Visit: Payer: Medicare Other | Admitting: Physician Assistant

## 2022-02-08 VITALS — BP 110/80 | HR 70 | Ht 61.5 in | Wt 152.0 lb

## 2022-02-08 DIAGNOSIS — R0989 Other specified symptoms and signs involving the circulatory and respiratory systems: Secondary | ICD-10-CM | POA: Diagnosis not present

## 2022-02-08 DIAGNOSIS — I480 Paroxysmal atrial fibrillation: Secondary | ICD-10-CM

## 2022-02-08 DIAGNOSIS — Q2112 Patent foramen ovale: Secondary | ICD-10-CM | POA: Diagnosis not present

## 2022-02-08 DIAGNOSIS — I2510 Atherosclerotic heart disease of native coronary artery without angina pectoris: Secondary | ICD-10-CM | POA: Diagnosis not present

## 2022-02-08 DIAGNOSIS — Z86711 Personal history of pulmonary embolism: Secondary | ICD-10-CM

## 2022-02-08 DIAGNOSIS — E039 Hypothyroidism, unspecified: Secondary | ICD-10-CM | POA: Diagnosis not present

## 2022-02-08 DIAGNOSIS — D649 Anemia, unspecified: Secondary | ICD-10-CM

## 2022-02-08 DIAGNOSIS — R600 Localized edema: Secondary | ICD-10-CM | POA: Diagnosis not present

## 2022-02-08 DIAGNOSIS — K5900 Constipation, unspecified: Secondary | ICD-10-CM | POA: Diagnosis not present

## 2022-02-08 DIAGNOSIS — R300 Dysuria: Secondary | ICD-10-CM | POA: Diagnosis not present

## 2022-02-08 NOTE — Patient Instructions (Addendum)
Medication Instructions:  Your physician recommends that you continue on your current medications as directed. Please refer to the Current Medication list given to you today. *If you need a refill on your cardiac medications before your next appointment, please call your pharmacy*   Lab Work: TODAY-BMET & CBC If you have labs (blood work) drawn today and your tests are completely normal, you will receive your results only by: MyChart Message (if you have MyChart) OR A paper copy in the mail If you have any lab test that is abnormal or we need to change your treatment, we will call you to review the results.   Testing/Procedures: Your physician has requested that you have a carotid duplex. This test is an ultrasound of the carotid arteries in your neck. It looks at blood flow through these arteries that supply the brain with blood. Allow one hour for this exam. There are no restrictions or special instructions. THIS TEST WILL BE COMPLETED AT [address]   Follow-Up: At [facility], you and your health needs are our priority.  As part of our continuing mission to provide you with exceptional heart care, we have created designated Provider Care Teams.  These Care Teams include your primary Cardiologist (physician) and Advanced Practice Providers (APPs -  Physician Assistants and Nurse Practitioners) who all work together to provide you with the care you need, when you need it.  We recommend signing up for the patient portal called "MyChart".  Sign up information is provided on this After Visit Summary.  MyChart is used to connect with patients for Virtual Visits (Telemedicine).  Patients are able to view lab/test results, encounter notes, upcoming appointments, etc.  Non-urgent messages can be sent to your provider as well.   To learn more about what you can do with MyChart, go to [email or web address].    Your next appointment:   3 month(s)  The format for your next  appointment:   In Person  Provider:   Armanda Magic, MD or Ronie Spies, PA    Other Instructions   Important Information About Sugar

## 2022-02-09 ENCOUNTER — Encounter: Payer: Self-pay | Admitting: Neurology

## 2022-02-09 ENCOUNTER — Other Ambulatory Visit: Payer: Self-pay | Admitting: *Deleted

## 2022-02-09 ENCOUNTER — Ambulatory Visit: Payer: Medicare Other | Admitting: Neurology

## 2022-02-09 VITALS — BP 122/74 | HR 74 | Ht 62.0 in

## 2022-02-09 DIAGNOSIS — I10 Essential (primary) hypertension: Secondary | ICD-10-CM

## 2022-02-09 DIAGNOSIS — G214 Vascular parkinsonism: Secondary | ICD-10-CM | POA: Diagnosis not present

## 2022-02-09 LAB — CBC
Hematocrit: 28.1 % — ABNORMAL LOW (ref 34.0–46.6)
Hemoglobin: 9.3 g/dL — ABNORMAL LOW (ref 11.1–15.9)
MCH: 25.8 pg — ABNORMAL LOW (ref 26.6–33.0)
MCHC: 33.1 g/dL (ref 31.5–35.7)
MCV: 78 fL — ABNORMAL LOW (ref 79–97)
Platelets: 331 10*3/uL (ref 150–450)
RBC: 3.6 x10E6/uL — ABNORMAL LOW (ref 3.77–5.28)
RDW: 16 % — ABNORMAL HIGH (ref 11.7–15.4)
WBC: 5.4 10*3/uL (ref 3.4–10.8)

## 2022-02-09 LAB — BASIC METABOLIC PANEL
BUN/Creatinine Ratio: 25 (ref 12–28)
BUN: 21 mg/dL (ref 8–27)
CO2: 25 mmol/L (ref 20–29)
Calcium: 8.7 mg/dL (ref 8.7–10.3)
Chloride: 104 mmol/L (ref 96–106)
Creatinine, Ser: 0.83 mg/dL (ref 0.57–1.00)
Glucose: 105 mg/dL — ABNORMAL HIGH (ref 70–99)
Potassium: 4.4 mmol/L (ref 3.5–5.2)
Sodium: 142 mmol/L (ref 134–144)
eGFR: 76 mL/min/{1.73_m2} (ref >59)

## 2022-02-10 ENCOUNTER — Encounter: Payer: Medicare Other | Admitting: Occupational Therapy

## 2022-02-10 ENCOUNTER — Ambulatory Visit: Payer: Medicare Other | Admitting: Physical Therapy

## 2022-02-10 DIAGNOSIS — E119 Type 2 diabetes mellitus without complications: Secondary | ICD-10-CM | POA: Diagnosis not present

## 2022-02-11 DIAGNOSIS — Z9181 History of falling: Secondary | ICD-10-CM | POA: Diagnosis not present

## 2022-02-11 DIAGNOSIS — R2689 Other abnormalities of gait and mobility: Secondary | ICD-10-CM | POA: Diagnosis not present

## 2022-02-11 DIAGNOSIS — M6281 Muscle weakness (generalized): Secondary | ICD-10-CM | POA: Diagnosis not present

## 2022-02-15 ENCOUNTER — Ambulatory Visit: Payer: Medicare Other | Admitting: Family Medicine

## 2022-02-15 ENCOUNTER — Encounter: Payer: Medicare Other | Admitting: Occupational Therapy

## 2022-02-15 ENCOUNTER — Ambulatory Visit: Payer: Medicare Other | Admitting: Physical Therapy

## 2022-02-16 ENCOUNTER — Telehealth: Payer: Self-pay

## 2022-02-16 DIAGNOSIS — Z9181 History of falling: Secondary | ICD-10-CM | POA: Diagnosis not present

## 2022-02-16 DIAGNOSIS — E119 Type 2 diabetes mellitus without complications: Secondary | ICD-10-CM | POA: Diagnosis not present

## 2022-02-16 DIAGNOSIS — Z7901 Long term (current) use of anticoagulants: Secondary | ICD-10-CM | POA: Diagnosis not present

## 2022-02-16 DIAGNOSIS — Z1612 Extended spectrum beta lactamase (ESBL) resistance: Secondary | ICD-10-CM | POA: Diagnosis not present

## 2022-02-16 DIAGNOSIS — I10 Essential (primary) hypertension: Secondary | ICD-10-CM | POA: Diagnosis not present

## 2022-02-16 DIAGNOSIS — E785 Hyperlipidemia, unspecified: Secondary | ICD-10-CM | POA: Diagnosis not present

## 2022-02-16 DIAGNOSIS — M545 Low back pain, unspecified: Secondary | ICD-10-CM | POA: Diagnosis not present

## 2022-02-16 DIAGNOSIS — G20 Parkinson's disease: Secondary | ICD-10-CM | POA: Diagnosis not present

## 2022-02-16 DIAGNOSIS — I872 Venous insufficiency (chronic) (peripheral): Secondary | ICD-10-CM | POA: Diagnosis not present

## 2022-02-16 DIAGNOSIS — I2699 Other pulmonary embolism without acute cor pulmonale: Secondary | ICD-10-CM | POA: Diagnosis not present

## 2022-02-16 DIAGNOSIS — N390 Urinary tract infection, site not specified: Secondary | ICD-10-CM | POA: Diagnosis not present

## 2022-02-16 DIAGNOSIS — I214 Non-ST elevation (NSTEMI) myocardial infarction: Secondary | ICD-10-CM | POA: Diagnosis not present

## 2022-02-16 DIAGNOSIS — G8929 Other chronic pain: Secondary | ICD-10-CM | POA: Diagnosis not present

## 2022-02-16 DIAGNOSIS — I4891 Unspecified atrial fibrillation: Secondary | ICD-10-CM | POA: Diagnosis not present

## 2022-02-16 DIAGNOSIS — D509 Iron deficiency anemia, unspecified: Secondary | ICD-10-CM | POA: Diagnosis not present

## 2022-02-16 DIAGNOSIS — E039 Hypothyroidism, unspecified: Secondary | ICD-10-CM | POA: Diagnosis not present

## 2022-02-16 NOTE — Telephone Encounter (Signed)
[name] calls nurse line requesting VO for Anna Bernard PT as follows.   1x a week for 1 week  2x a week for 8 weeks   Verbal order given.

## 2022-02-17 ENCOUNTER — Encounter: Payer: Medicare Other | Admitting: Occupational Therapy

## 2022-02-17 ENCOUNTER — Ambulatory Visit: Payer: Medicare Other | Admitting: Physical Therapy

## 2022-02-22 ENCOUNTER — Encounter: Payer: Self-pay | Admitting: *Deleted

## 2022-02-23 DIAGNOSIS — E039 Hypothyroidism, unspecified: Secondary | ICD-10-CM | POA: Diagnosis not present

## 2022-02-23 DIAGNOSIS — G8929 Other chronic pain: Secondary | ICD-10-CM | POA: Diagnosis not present

## 2022-02-23 DIAGNOSIS — I2699 Other pulmonary embolism without acute cor pulmonale: Secondary | ICD-10-CM | POA: Diagnosis not present

## 2022-02-23 DIAGNOSIS — D509 Iron deficiency anemia, unspecified: Secondary | ICD-10-CM | POA: Diagnosis not present

## 2022-02-23 DIAGNOSIS — Z1612 Extended spectrum beta lactamase (ESBL) resistance: Secondary | ICD-10-CM | POA: Diagnosis not present

## 2022-02-23 DIAGNOSIS — N390 Urinary tract infection, site not specified: Secondary | ICD-10-CM | POA: Diagnosis not present

## 2022-02-23 DIAGNOSIS — I214 Non-ST elevation (NSTEMI) myocardial infarction: Secondary | ICD-10-CM | POA: Diagnosis not present

## 2022-02-23 DIAGNOSIS — Z9181 History of falling: Secondary | ICD-10-CM | POA: Diagnosis not present

## 2022-02-23 DIAGNOSIS — I872 Venous insufficiency (chronic) (peripheral): Secondary | ICD-10-CM | POA: Diagnosis not present

## 2022-02-23 DIAGNOSIS — E119 Type 2 diabetes mellitus without complications: Secondary | ICD-10-CM | POA: Diagnosis not present

## 2022-02-23 DIAGNOSIS — G20 Parkinson's disease: Secondary | ICD-10-CM | POA: Diagnosis not present

## 2022-02-23 DIAGNOSIS — Z7901 Long term (current) use of anticoagulants: Secondary | ICD-10-CM | POA: Diagnosis not present

## 2022-02-23 DIAGNOSIS — E785 Hyperlipidemia, unspecified: Secondary | ICD-10-CM | POA: Diagnosis not present

## 2022-02-23 DIAGNOSIS — I10 Essential (primary) hypertension: Secondary | ICD-10-CM | POA: Diagnosis not present

## 2022-02-23 DIAGNOSIS — M545 Low back pain, unspecified: Secondary | ICD-10-CM | POA: Diagnosis not present

## 2022-02-23 DIAGNOSIS — I4891 Unspecified atrial fibrillation: Secondary | ICD-10-CM | POA: Diagnosis not present

## 2022-02-25 ENCOUNTER — Telehealth: Payer: Self-pay

## 2022-02-25 DIAGNOSIS — M545 Low back pain, unspecified: Secondary | ICD-10-CM | POA: Diagnosis not present

## 2022-02-25 DIAGNOSIS — Z1612 Extended spectrum beta lactamase (ESBL) resistance: Secondary | ICD-10-CM | POA: Diagnosis not present

## 2022-02-25 DIAGNOSIS — I10 Essential (primary) hypertension: Secondary | ICD-10-CM | POA: Diagnosis not present

## 2022-02-25 DIAGNOSIS — Z9181 History of falling: Secondary | ICD-10-CM | POA: Diagnosis not present

## 2022-02-25 DIAGNOSIS — I2699 Other pulmonary embolism without acute cor pulmonale: Secondary | ICD-10-CM | POA: Diagnosis not present

## 2022-02-25 DIAGNOSIS — E785 Hyperlipidemia, unspecified: Secondary | ICD-10-CM | POA: Diagnosis not present

## 2022-02-25 DIAGNOSIS — G8929 Other chronic pain: Secondary | ICD-10-CM | POA: Diagnosis not present

## 2022-02-25 DIAGNOSIS — E039 Hypothyroidism, unspecified: Secondary | ICD-10-CM | POA: Diagnosis not present

## 2022-02-25 DIAGNOSIS — N390 Urinary tract infection, site not specified: Secondary | ICD-10-CM | POA: Diagnosis not present

## 2022-02-25 DIAGNOSIS — I872 Venous insufficiency (chronic) (peripheral): Secondary | ICD-10-CM | POA: Diagnosis not present

## 2022-02-25 DIAGNOSIS — Z7901 Long term (current) use of anticoagulants: Secondary | ICD-10-CM | POA: Diagnosis not present

## 2022-02-25 DIAGNOSIS — I4891 Unspecified atrial fibrillation: Secondary | ICD-10-CM | POA: Diagnosis not present

## 2022-02-25 DIAGNOSIS — D509 Iron deficiency anemia, unspecified: Secondary | ICD-10-CM | POA: Diagnosis not present

## 2022-02-25 DIAGNOSIS — I214 Non-ST elevation (NSTEMI) myocardial infarction: Secondary | ICD-10-CM | POA: Diagnosis not present

## 2022-02-25 DIAGNOSIS — E119 Type 2 diabetes mellitus without complications: Secondary | ICD-10-CM | POA: Diagnosis not present

## 2022-02-25 DIAGNOSIS — G20 Parkinson's disease: Secondary | ICD-10-CM | POA: Diagnosis not present

## 2022-02-25 NOTE — Telephone Encounter (Signed)
Anna [facility] PTA calls nurse line reporting abnormal respirations.   Anna Bernard reports before exercise patients respiratory rate was 24. However, after walking and bed transfers respiratory rate went to 30.  Anna Bernard reports all other vitals WNL. HR 80 and O2 99%. Anna Bernard denies any wheezing or SOB. Patient is speaking in full sentences.   Anna Bernard reports respiratory rate is improving slowly.   Anna Bernard denies acute distress.   Anna Bernard plans to monitor and ED precautions given.

## 2022-02-28 ENCOUNTER — Ambulatory Visit (HOSPITAL_COMMUNITY)
Admission: RE | Admit: 2022-02-28 | Discharge: 2022-02-28 | Disposition: A | Discharge status: Home / Self Care | Payer: Medicare Other | Source: Ambulatory Visit | Attending: Physician Assistant | Admitting: Physician Assistant

## 2022-02-28 DIAGNOSIS — I480 Paroxysmal atrial fibrillation: Secondary | ICD-10-CM | POA: Insufficient documentation

## 2022-02-28 DIAGNOSIS — Q2112 Patent foramen ovale: Secondary | ICD-10-CM | POA: Insufficient documentation

## 2022-02-28 DIAGNOSIS — Z86711 Personal history of pulmonary embolism: Secondary | ICD-10-CM | POA: Insufficient documentation

## 2022-02-28 DIAGNOSIS — I2510 Atherosclerotic heart disease of native coronary artery without angina pectoris: Secondary | ICD-10-CM | POA: Insufficient documentation

## 2022-02-28 DIAGNOSIS — D649 Anemia, unspecified: Secondary | ICD-10-CM | POA: Insufficient documentation

## 2022-02-28 DIAGNOSIS — R600 Localized edema: Secondary | ICD-10-CM | POA: Insufficient documentation

## 2022-02-28 DIAGNOSIS — R0989 Other specified symptoms and signs involving the circulatory and respiratory systems: Secondary | ICD-10-CM | POA: Diagnosis not present

## 2022-03-01 DIAGNOSIS — Z7901 Long term (current) use of anticoagulants: Secondary | ICD-10-CM | POA: Diagnosis not present

## 2022-03-01 DIAGNOSIS — N390 Urinary tract infection, site not specified: Secondary | ICD-10-CM | POA: Diagnosis not present

## 2022-03-01 DIAGNOSIS — G8929 Other chronic pain: Secondary | ICD-10-CM | POA: Diagnosis not present

## 2022-03-01 DIAGNOSIS — I214 Non-ST elevation (NSTEMI) myocardial infarction: Secondary | ICD-10-CM | POA: Diagnosis not present

## 2022-03-01 DIAGNOSIS — D509 Iron deficiency anemia, unspecified: Secondary | ICD-10-CM | POA: Diagnosis not present

## 2022-03-01 DIAGNOSIS — M545 Low back pain, unspecified: Secondary | ICD-10-CM | POA: Diagnosis not present

## 2022-03-01 DIAGNOSIS — Z9181 History of falling: Secondary | ICD-10-CM | POA: Diagnosis not present

## 2022-03-01 DIAGNOSIS — E119 Type 2 diabetes mellitus without complications: Secondary | ICD-10-CM | POA: Diagnosis not present

## 2022-03-01 DIAGNOSIS — I2699 Other pulmonary embolism without acute cor pulmonale: Secondary | ICD-10-CM | POA: Diagnosis not present

## 2022-03-01 DIAGNOSIS — Z1612 Extended spectrum beta lactamase (ESBL) resistance: Secondary | ICD-10-CM | POA: Diagnosis not present

## 2022-03-01 DIAGNOSIS — G20 Parkinson's disease: Secondary | ICD-10-CM | POA: Diagnosis not present

## 2022-03-01 DIAGNOSIS — E039 Hypothyroidism, unspecified: Secondary | ICD-10-CM | POA: Diagnosis not present

## 2022-03-01 DIAGNOSIS — I4891 Unspecified atrial fibrillation: Secondary | ICD-10-CM | POA: Diagnosis not present

## 2022-03-01 DIAGNOSIS — E785 Hyperlipidemia, unspecified: Secondary | ICD-10-CM | POA: Diagnosis not present

## 2022-03-01 DIAGNOSIS — I872 Venous insufficiency (chronic) (peripheral): Secondary | ICD-10-CM | POA: Diagnosis not present

## 2022-03-01 DIAGNOSIS — I10 Essential (primary) hypertension: Secondary | ICD-10-CM | POA: Diagnosis not present

## 2022-03-01 NOTE — Progress Notes (Signed)
    SUBJECTIVE:   CHIEF COMPLAINT / HPI:   Paroxysmal Atrial fibrillation, possible PFO- new diagnosis in April 2023, now on metoprolol tartrate 25 mg BID and Eliquis 5 mg BID. Had elevated TSH in hospital that needs repeated. Following with cardiology. Denies melena or hematochezia, does note that her stools are black and have been for a while but was wondering if that was her iron she was taking. Not black sticky or coffee grounds. No dizziness or palpitations.  Nonocclusive PE- diagnosed in April 2023, started on Eliquis. Denies chest pain, hemoptysis, or shortness of breath  Parkinson's disease- since home from SNF, she has fallen several times. Husband trying to make house safer. Has not hit her head.   Microcytic iron deficiency anemia- recent Hgb 9.3 at cardiology office. Had colonoscopy showing diverticulosis in 2019 with Dr Benson Norway.  Also had normal EGD, capsule endoscopy showed non-bleeding AVM and noted that could try for ablation if unable to keep up with anemia.   PERTINENT  PMH / PSH: as above  OBJECTIVE:   BP 118/70   Pulse 69   Wt 159 lb (72.1 kg)   SpO2 96%   BMI 29.08 kg/m   General: A&O, NAD HEENT: No sign of trauma, EOM grossly intact Cardiac: RRR, no m/r/g Respiratory: CTAB, normal WOB, no w/c/r GI: Soft, NTTP, non-distended  Extremities: NTTP, no peripheral edema. Neuro: in wheelchair, moves all four extremities appropriately. Psych: Appropriate mood and affect   ASSESSMENT/PLAN:   Pulmonary embolus (HCC) - continue Eliquis, rechecking CBC today, no chest pain or shortness of breath  Paroxysmal atrial fibrillation (HCC) - continue metoprolol and Eliquis, follows with cardiology  Parkinson disease ([place]) - continue home meds, follows with neurology, husband working on house adaptations to decrease risk of falling, has PT and OT coming out to the house  Iron deficiency anemia due to chronic blood loss - CBC checked today, down to 8.1, taking home iron,  will call to have her have it checked later this week for stability, discussed ED/return precautions including bright red blood or melena, may need to hold Eliquis or consider IV iron infusions or consider ablation of known AVMs  Hypothyroidism TSH low, overtreated, decrease synthroid to 125 mcg daily and repeat TSH in 4-6 weeks   Update: called patient and discussed needs to come back for CBC today or tomorrow. She will call her husband. Future order for H/H placed. Also called her husband, he will bring her in tomorrow AM. Discussed ED/return precuations including melena, hematochezia, dizziness, chest pain, palpitations. Also discussed discontinuing synthroid 150 mcg and picking up 125 mcg to start. Answered all questions and concerns.  [name], MD [place]

## 2022-03-02 ENCOUNTER — Encounter: Payer: Self-pay | Admitting: Family Medicine

## 2022-03-02 ENCOUNTER — Ambulatory Visit (INDEPENDENT_AMBULATORY_CARE_PROVIDER_SITE_OTHER): Payer: Medicare Other | Admitting: Family Medicine

## 2022-03-02 VITALS — BP 118/70 | HR 69 | Wt 159.0 lb

## 2022-03-02 DIAGNOSIS — I480 Paroxysmal atrial fibrillation: Secondary | ICD-10-CM | POA: Diagnosis not present

## 2022-03-02 DIAGNOSIS — E039 Hypothyroidism, unspecified: Secondary | ICD-10-CM | POA: Diagnosis not present

## 2022-03-02 DIAGNOSIS — I2699 Other pulmonary embolism without acute cor pulmonale: Secondary | ICD-10-CM | POA: Diagnosis not present

## 2022-03-02 DIAGNOSIS — G20 Parkinson's disease: Secondary | ICD-10-CM

## 2022-03-02 DIAGNOSIS — I10 Essential (primary) hypertension: Secondary | ICD-10-CM | POA: Diagnosis not present

## 2022-03-02 DIAGNOSIS — D500 Iron deficiency anemia secondary to blood loss (chronic): Secondary | ICD-10-CM | POA: Diagnosis not present

## 2022-03-02 DIAGNOSIS — E119 Type 2 diabetes mellitus without complications: Secondary | ICD-10-CM

## 2022-03-02 NOTE — Patient Instructions (Signed)
It was wonderful to see you today.  Please bring ALL of your medications with you to every visit.   Today we talked about:  - We will recheck your thyroid and your diabetes number - Your Hgb at the cardiologist remained stable, continue your iron tabs and if you have dark black stools or blood in stools,    Thank you for choosing [facility].   Please call [phone] with any questions about today's appointment.  Please be sure to schedule follow up at the front  desk before you leave today.   Please arrive at least 15 minutes prior to your scheduled appointments.   If you had blood work today, I will send you a MyChart message or a letter if results are normal. Otherwise, I will give you a call.   If you had a referral placed, they will call you to set up an appointment. Please give [place] a call if you don't hear back in the next 2 weeks.   If you need additional refills before your next appointment, please call your pharmacy first.   Anna Savannah, MD  Family Medicine

## 2022-03-03 DIAGNOSIS — I4891 Unspecified atrial fibrillation: Secondary | ICD-10-CM | POA: Diagnosis not present

## 2022-03-03 DIAGNOSIS — G8929 Other chronic pain: Secondary | ICD-10-CM | POA: Diagnosis not present

## 2022-03-03 DIAGNOSIS — I214 Non-ST elevation (NSTEMI) myocardial infarction: Secondary | ICD-10-CM | POA: Diagnosis not present

## 2022-03-03 DIAGNOSIS — E785 Hyperlipidemia, unspecified: Secondary | ICD-10-CM | POA: Diagnosis not present

## 2022-03-03 DIAGNOSIS — Z7901 Long term (current) use of anticoagulants: Secondary | ICD-10-CM | POA: Diagnosis not present

## 2022-03-03 DIAGNOSIS — E039 Hypothyroidism, unspecified: Secondary | ICD-10-CM | POA: Insufficient documentation

## 2022-03-03 DIAGNOSIS — I2699 Other pulmonary embolism without acute cor pulmonale: Secondary | ICD-10-CM | POA: Diagnosis not present

## 2022-03-03 DIAGNOSIS — N390 Urinary tract infection, site not specified: Secondary | ICD-10-CM | POA: Diagnosis not present

## 2022-03-03 DIAGNOSIS — D509 Iron deficiency anemia, unspecified: Secondary | ICD-10-CM | POA: Diagnosis not present

## 2022-03-03 DIAGNOSIS — I872 Venous insufficiency (chronic) (peripheral): Secondary | ICD-10-CM | POA: Diagnosis not present

## 2022-03-03 DIAGNOSIS — I10 Essential (primary) hypertension: Secondary | ICD-10-CM | POA: Diagnosis not present

## 2022-03-03 DIAGNOSIS — Z9181 History of falling: Secondary | ICD-10-CM | POA: Diagnosis not present

## 2022-03-03 DIAGNOSIS — M545 Low back pain, unspecified: Secondary | ICD-10-CM | POA: Diagnosis not present

## 2022-03-03 DIAGNOSIS — E119 Type 2 diabetes mellitus without complications: Secondary | ICD-10-CM | POA: Diagnosis not present

## 2022-03-03 DIAGNOSIS — Z1612 Extended spectrum beta lactamase (ESBL) resistance: Secondary | ICD-10-CM | POA: Diagnosis not present

## 2022-03-03 DIAGNOSIS — G20 Parkinson's disease: Secondary | ICD-10-CM | POA: Diagnosis not present

## 2022-03-03 LAB — HEMOGLOBIN A1C
Est. average glucose Bld gHb Est-mCnc: 100 mg/dL
Hgb A1c MFr Bld: 5.1 % (ref 4.8–5.6)

## 2022-03-03 LAB — T4F: T4,Free (Direct): 1.85 ng/dL — ABNORMAL HIGH (ref 0.82–1.77)

## 2022-03-03 LAB — TSH RFX ON ABNORMAL TO FREE T4: TSH: 0.157 u[IU]/mL — ABNORMAL LOW (ref 0.450–4.500)

## 2022-03-03 LAB — CBC
Hematocrit: 25.9 % — ABNORMAL LOW (ref 34.0–46.6)
Hemoglobin: 8.1 g/dL — ABNORMAL LOW (ref 11.1–15.9)
MCH: 25.4 pg — ABNORMAL LOW (ref 26.6–33.0)
MCHC: 31.3 g/dL — ABNORMAL LOW (ref 31.5–35.7)
MCV: 81 fL (ref 79–97)
Platelets: 313 10*3/uL (ref 150–450)
RBC: 3.19 x10E6/uL — ABNORMAL LOW (ref 3.77–5.28)
RDW: 16.9 % — ABNORMAL HIGH (ref 11.7–15.4)
WBC: 5.3 10*3/uL (ref 3.4–10.8)

## 2022-03-03 MED ORDER — LEVOTHYROXINE SODIUM 125 MCG PO TABS
125.0000 ug | ORAL_TABLET | ORAL | 1 refills | Status: DC
Start: 2022-03-03 — End: 2022-04-11

## 2022-03-03 NOTE — Assessment & Plan Note (Signed)
-   continue metoprolol and Eliquis, follows with cardiology

## 2022-03-03 NOTE — Assessment & Plan Note (Signed)
-   continue Eliquis, rechecking CBC today, no chest pain or shortness of breath

## 2022-03-03 NOTE — Assessment & Plan Note (Signed)
TSH low, overtreated, decrease synthroid to 125 mcg daily and repeat TSH in 4-6 weeks

## 2022-03-03 NOTE — Assessment & Plan Note (Signed)
-   continue home meds, follows with neurology, husband working on house adaptations to decrease risk of falling, has PT and OT coming out to the house

## 2022-03-03 NOTE — Assessment & Plan Note (Signed)
-   CBC checked today, down to 8.1, taking home iron, will call to have her have it checked later this week for stability, discussed ED/return precautions including bright red blood or melena, may need to hold Eliquis or consider IV iron infusions or consider ablation of known AVMs

## 2022-03-04 ENCOUNTER — Other Ambulatory Visit: Payer: Medicare Other

## 2022-03-04 ENCOUNTER — Other Ambulatory Visit: Payer: Self-pay | Admitting: *Deleted

## 2022-03-04 DIAGNOSIS — D500 Iron deficiency anemia secondary to blood loss (chronic): Secondary | ICD-10-CM

## 2022-03-05 LAB — HEMOGLOBIN AND HEMATOCRIT, BLOOD
Hematocrit: 26.6 % — ABNORMAL LOW (ref 34.0–46.6)
Hemoglobin: 8.5 g/dL — ABNORMAL LOW (ref 11.1–15.9)

## 2022-03-07 ENCOUNTER — Other Ambulatory Visit: Payer: Self-pay | Admitting: Neurology

## 2022-03-07 DIAGNOSIS — Z9181 History of falling: Secondary | ICD-10-CM | POA: Diagnosis not present

## 2022-03-07 DIAGNOSIS — H34831 Tributary (branch) retinal vein occlusion, right eye, with macular edema: Secondary | ICD-10-CM | POA: Diagnosis not present

## 2022-03-07 DIAGNOSIS — G8929 Other chronic pain: Secondary | ICD-10-CM | POA: Diagnosis not present

## 2022-03-07 DIAGNOSIS — I10 Essential (primary) hypertension: Secondary | ICD-10-CM | POA: Diagnosis not present

## 2022-03-07 DIAGNOSIS — I872 Venous insufficiency (chronic) (peripheral): Secondary | ICD-10-CM | POA: Diagnosis not present

## 2022-03-07 DIAGNOSIS — E039 Hypothyroidism, unspecified: Secondary | ICD-10-CM | POA: Diagnosis not present

## 2022-03-07 DIAGNOSIS — M545 Low back pain, unspecified: Secondary | ICD-10-CM | POA: Diagnosis not present

## 2022-03-07 DIAGNOSIS — E785 Hyperlipidemia, unspecified: Secondary | ICD-10-CM | POA: Diagnosis not present

## 2022-03-07 DIAGNOSIS — Z1612 Extended spectrum beta lactamase (ESBL) resistance: Secondary | ICD-10-CM | POA: Diagnosis not present

## 2022-03-07 DIAGNOSIS — I4891 Unspecified atrial fibrillation: Secondary | ICD-10-CM | POA: Diagnosis not present

## 2022-03-07 DIAGNOSIS — I214 Non-ST elevation (NSTEMI) myocardial infarction: Secondary | ICD-10-CM | POA: Diagnosis not present

## 2022-03-07 DIAGNOSIS — G20 Parkinson's disease: Secondary | ICD-10-CM | POA: Diagnosis not present

## 2022-03-07 DIAGNOSIS — N390 Urinary tract infection, site not specified: Secondary | ICD-10-CM | POA: Diagnosis not present

## 2022-03-07 DIAGNOSIS — E119 Type 2 diabetes mellitus without complications: Secondary | ICD-10-CM | POA: Diagnosis not present

## 2022-03-07 DIAGNOSIS — D509 Iron deficiency anemia, unspecified: Secondary | ICD-10-CM | POA: Diagnosis not present

## 2022-03-07 DIAGNOSIS — Z7901 Long term (current) use of anticoagulants: Secondary | ICD-10-CM | POA: Diagnosis not present

## 2022-03-07 DIAGNOSIS — I2699 Other pulmonary embolism without acute cor pulmonale: Secondary | ICD-10-CM | POA: Diagnosis not present

## 2022-03-09 ENCOUNTER — Other Ambulatory Visit: Payer: Self-pay | Admitting: Family Medicine

## 2022-03-09 DIAGNOSIS — M545 Other chronic pain: Secondary | ICD-10-CM

## 2022-03-10 DIAGNOSIS — Z1612 Extended spectrum beta lactamase (ESBL) resistance: Secondary | ICD-10-CM | POA: Diagnosis not present

## 2022-03-10 DIAGNOSIS — E119 Type 2 diabetes mellitus without complications: Secondary | ICD-10-CM | POA: Diagnosis not present

## 2022-03-10 DIAGNOSIS — Z7901 Long term (current) use of anticoagulants: Secondary | ICD-10-CM | POA: Diagnosis not present

## 2022-03-10 DIAGNOSIS — E785 Hyperlipidemia, unspecified: Secondary | ICD-10-CM | POA: Diagnosis not present

## 2022-03-10 DIAGNOSIS — G20 Parkinson's disease: Secondary | ICD-10-CM | POA: Diagnosis not present

## 2022-03-10 DIAGNOSIS — I872 Venous insufficiency (chronic) (peripheral): Secondary | ICD-10-CM | POA: Diagnosis not present

## 2022-03-10 DIAGNOSIS — M545 Low back pain, unspecified: Secondary | ICD-10-CM | POA: Diagnosis not present

## 2022-03-10 DIAGNOSIS — G8929 Other chronic pain: Secondary | ICD-10-CM | POA: Diagnosis not present

## 2022-03-10 DIAGNOSIS — I4891 Unspecified atrial fibrillation: Secondary | ICD-10-CM | POA: Diagnosis not present

## 2022-03-10 DIAGNOSIS — D509 Iron deficiency anemia, unspecified: Secondary | ICD-10-CM | POA: Diagnosis not present

## 2022-03-10 DIAGNOSIS — Z9181 History of falling: Secondary | ICD-10-CM | POA: Diagnosis not present

## 2022-03-10 DIAGNOSIS — I2699 Other pulmonary embolism without acute cor pulmonale: Secondary | ICD-10-CM | POA: Diagnosis not present

## 2022-03-10 DIAGNOSIS — I214 Non-ST elevation (NSTEMI) myocardial infarction: Secondary | ICD-10-CM | POA: Diagnosis not present

## 2022-03-10 DIAGNOSIS — N390 Urinary tract infection, site not specified: Secondary | ICD-10-CM | POA: Diagnosis not present

## 2022-03-10 DIAGNOSIS — E039 Hypothyroidism, unspecified: Secondary | ICD-10-CM | POA: Diagnosis not present

## 2022-03-10 DIAGNOSIS — I10 Essential (primary) hypertension: Secondary | ICD-10-CM | POA: Diagnosis not present

## 2022-03-11 DIAGNOSIS — Z1612 Extended spectrum beta lactamase (ESBL) resistance: Secondary | ICD-10-CM | POA: Diagnosis not present

## 2022-03-11 DIAGNOSIS — M545 Low back pain, unspecified: Secondary | ICD-10-CM | POA: Diagnosis not present

## 2022-03-11 DIAGNOSIS — I214 Non-ST elevation (NSTEMI) myocardial infarction: Secondary | ICD-10-CM | POA: Diagnosis not present

## 2022-03-11 DIAGNOSIS — E119 Type 2 diabetes mellitus without complications: Secondary | ICD-10-CM | POA: Diagnosis not present

## 2022-03-11 DIAGNOSIS — N390 Urinary tract infection, site not specified: Secondary | ICD-10-CM | POA: Diagnosis not present

## 2022-03-11 DIAGNOSIS — D509 Iron deficiency anemia, unspecified: Secondary | ICD-10-CM | POA: Diagnosis not present

## 2022-03-11 DIAGNOSIS — E039 Hypothyroidism, unspecified: Secondary | ICD-10-CM | POA: Diagnosis not present

## 2022-03-11 DIAGNOSIS — I872 Venous insufficiency (chronic) (peripheral): Secondary | ICD-10-CM | POA: Diagnosis not present

## 2022-03-11 DIAGNOSIS — G20 Parkinson's disease: Secondary | ICD-10-CM | POA: Diagnosis not present

## 2022-03-11 DIAGNOSIS — I10 Essential (primary) hypertension: Secondary | ICD-10-CM | POA: Diagnosis not present

## 2022-03-11 DIAGNOSIS — I4891 Unspecified atrial fibrillation: Secondary | ICD-10-CM | POA: Diagnosis not present

## 2022-03-11 DIAGNOSIS — Z9181 History of falling: Secondary | ICD-10-CM | POA: Diagnosis not present

## 2022-03-11 DIAGNOSIS — I2699 Other pulmonary embolism without acute cor pulmonale: Secondary | ICD-10-CM | POA: Diagnosis not present

## 2022-03-11 DIAGNOSIS — G8929 Other chronic pain: Secondary | ICD-10-CM | POA: Diagnosis not present

## 2022-03-11 DIAGNOSIS — E785 Hyperlipidemia, unspecified: Secondary | ICD-10-CM | POA: Diagnosis not present

## 2022-03-11 DIAGNOSIS — Z7901 Long term (current) use of anticoagulants: Secondary | ICD-10-CM | POA: Diagnosis not present

## 2022-03-14 DIAGNOSIS — I2699 Other pulmonary embolism without acute cor pulmonale: Secondary | ICD-10-CM | POA: Diagnosis not present

## 2022-03-14 DIAGNOSIS — Z9181 History of falling: Secondary | ICD-10-CM | POA: Diagnosis not present

## 2022-03-14 DIAGNOSIS — G8929 Other chronic pain: Secondary | ICD-10-CM | POA: Diagnosis not present

## 2022-03-14 DIAGNOSIS — E039 Hypothyroidism, unspecified: Secondary | ICD-10-CM | POA: Diagnosis not present

## 2022-03-14 DIAGNOSIS — G20 Parkinson's disease: Secondary | ICD-10-CM | POA: Diagnosis not present

## 2022-03-14 DIAGNOSIS — I10 Essential (primary) hypertension: Secondary | ICD-10-CM | POA: Diagnosis not present

## 2022-03-14 DIAGNOSIS — I4891 Unspecified atrial fibrillation: Secondary | ICD-10-CM | POA: Diagnosis not present

## 2022-03-14 DIAGNOSIS — Z1612 Extended spectrum beta lactamase (ESBL) resistance: Secondary | ICD-10-CM | POA: Diagnosis not present

## 2022-03-14 DIAGNOSIS — N390 Urinary tract infection, site not specified: Secondary | ICD-10-CM | POA: Diagnosis not present

## 2022-03-14 DIAGNOSIS — I872 Venous insufficiency (chronic) (peripheral): Secondary | ICD-10-CM | POA: Diagnosis not present

## 2022-03-14 DIAGNOSIS — Z7901 Long term (current) use of anticoagulants: Secondary | ICD-10-CM | POA: Diagnosis not present

## 2022-03-14 DIAGNOSIS — M545 Low back pain, unspecified: Secondary | ICD-10-CM | POA: Diagnosis not present

## 2022-03-14 DIAGNOSIS — E119 Type 2 diabetes mellitus without complications: Secondary | ICD-10-CM | POA: Diagnosis not present

## 2022-03-14 DIAGNOSIS — D509 Iron deficiency anemia, unspecified: Secondary | ICD-10-CM | POA: Diagnosis not present

## 2022-03-14 DIAGNOSIS — I214 Non-ST elevation (NSTEMI) myocardial infarction: Secondary | ICD-10-CM | POA: Diagnosis not present

## 2022-03-14 DIAGNOSIS — E785 Hyperlipidemia, unspecified: Secondary | ICD-10-CM | POA: Diagnosis not present

## 2022-03-17 DIAGNOSIS — E119 Type 2 diabetes mellitus without complications: Secondary | ICD-10-CM | POA: Diagnosis not present

## 2022-03-17 DIAGNOSIS — E785 Hyperlipidemia, unspecified: Secondary | ICD-10-CM | POA: Diagnosis not present

## 2022-03-17 DIAGNOSIS — I872 Venous insufficiency (chronic) (peripheral): Secondary | ICD-10-CM | POA: Diagnosis not present

## 2022-03-17 DIAGNOSIS — M545 Low back pain, unspecified: Secondary | ICD-10-CM | POA: Diagnosis not present

## 2022-03-17 DIAGNOSIS — Z1612 Extended spectrum beta lactamase (ESBL) resistance: Secondary | ICD-10-CM | POA: Diagnosis not present

## 2022-03-17 DIAGNOSIS — I2699 Other pulmonary embolism without acute cor pulmonale: Secondary | ICD-10-CM | POA: Diagnosis not present

## 2022-03-17 DIAGNOSIS — E039 Hypothyroidism, unspecified: Secondary | ICD-10-CM | POA: Diagnosis not present

## 2022-03-17 DIAGNOSIS — G20 Parkinson's disease: Secondary | ICD-10-CM | POA: Diagnosis not present

## 2022-03-17 DIAGNOSIS — N390 Urinary tract infection, site not specified: Secondary | ICD-10-CM | POA: Diagnosis not present

## 2022-03-17 DIAGNOSIS — I4891 Unspecified atrial fibrillation: Secondary | ICD-10-CM | POA: Diagnosis not present

## 2022-03-17 DIAGNOSIS — D509 Iron deficiency anemia, unspecified: Secondary | ICD-10-CM | POA: Diagnosis not present

## 2022-03-17 DIAGNOSIS — I10 Essential (primary) hypertension: Secondary | ICD-10-CM | POA: Diagnosis not present

## 2022-03-17 DIAGNOSIS — I214 Non-ST elevation (NSTEMI) myocardial infarction: Secondary | ICD-10-CM | POA: Diagnosis not present

## 2022-03-17 DIAGNOSIS — Z7901 Long term (current) use of anticoagulants: Secondary | ICD-10-CM | POA: Diagnosis not present

## 2022-03-17 DIAGNOSIS — Z9181 History of falling: Secondary | ICD-10-CM | POA: Diagnosis not present

## 2022-03-17 DIAGNOSIS — G8929 Other chronic pain: Secondary | ICD-10-CM | POA: Diagnosis not present

## 2022-03-18 DIAGNOSIS — I10 Essential (primary) hypertension: Secondary | ICD-10-CM | POA: Diagnosis not present

## 2022-03-18 DIAGNOSIS — I4891 Unspecified atrial fibrillation: Secondary | ICD-10-CM | POA: Diagnosis not present

## 2022-03-18 DIAGNOSIS — E039 Hypothyroidism, unspecified: Secondary | ICD-10-CM | POA: Diagnosis not present

## 2022-03-18 DIAGNOSIS — I214 Non-ST elevation (NSTEMI) myocardial infarction: Secondary | ICD-10-CM | POA: Diagnosis not present

## 2022-03-18 DIAGNOSIS — M545 Low back pain, unspecified: Secondary | ICD-10-CM | POA: Diagnosis not present

## 2022-03-18 DIAGNOSIS — Z7901 Long term (current) use of anticoagulants: Secondary | ICD-10-CM | POA: Diagnosis not present

## 2022-03-18 DIAGNOSIS — I872 Venous insufficiency (chronic) (peripheral): Secondary | ICD-10-CM | POA: Diagnosis not present

## 2022-03-18 DIAGNOSIS — N390 Urinary tract infection, site not specified: Secondary | ICD-10-CM | POA: Diagnosis not present

## 2022-03-18 DIAGNOSIS — E785 Hyperlipidemia, unspecified: Secondary | ICD-10-CM | POA: Diagnosis not present

## 2022-03-18 DIAGNOSIS — G20 Parkinson's disease: Secondary | ICD-10-CM | POA: Diagnosis not present

## 2022-03-18 DIAGNOSIS — I2699 Other pulmonary embolism without acute cor pulmonale: Secondary | ICD-10-CM | POA: Diagnosis not present

## 2022-03-18 DIAGNOSIS — Z1612 Extended spectrum beta lactamase (ESBL) resistance: Secondary | ICD-10-CM | POA: Diagnosis not present

## 2022-03-18 DIAGNOSIS — G8929 Other chronic pain: Secondary | ICD-10-CM | POA: Diagnosis not present

## 2022-03-18 DIAGNOSIS — E119 Type 2 diabetes mellitus without complications: Secondary | ICD-10-CM | POA: Diagnosis not present

## 2022-03-18 DIAGNOSIS — Z9181 History of falling: Secondary | ICD-10-CM | POA: Diagnosis not present

## 2022-03-18 DIAGNOSIS — D509 Iron deficiency anemia, unspecified: Secondary | ICD-10-CM | POA: Diagnosis not present

## 2022-03-21 DIAGNOSIS — Z7901 Long term (current) use of anticoagulants: Secondary | ICD-10-CM | POA: Diagnosis not present

## 2022-03-21 DIAGNOSIS — I4891 Unspecified atrial fibrillation: Secondary | ICD-10-CM | POA: Diagnosis not present

## 2022-03-21 DIAGNOSIS — I214 Non-ST elevation (NSTEMI) myocardial infarction: Secondary | ICD-10-CM | POA: Diagnosis not present

## 2022-03-21 DIAGNOSIS — M545 Low back pain, unspecified: Secondary | ICD-10-CM | POA: Diagnosis not present

## 2022-03-21 DIAGNOSIS — Z9181 History of falling: Secondary | ICD-10-CM | POA: Diagnosis not present

## 2022-03-21 DIAGNOSIS — Z1612 Extended spectrum beta lactamase (ESBL) resistance: Secondary | ICD-10-CM | POA: Diagnosis not present

## 2022-03-21 DIAGNOSIS — I2699 Other pulmonary embolism without acute cor pulmonale: Secondary | ICD-10-CM | POA: Diagnosis not present

## 2022-03-21 DIAGNOSIS — E785 Hyperlipidemia, unspecified: Secondary | ICD-10-CM | POA: Diagnosis not present

## 2022-03-21 DIAGNOSIS — E119 Type 2 diabetes mellitus without complications: Secondary | ICD-10-CM | POA: Diagnosis not present

## 2022-03-21 DIAGNOSIS — N390 Urinary tract infection, site not specified: Secondary | ICD-10-CM | POA: Diagnosis not present

## 2022-03-21 DIAGNOSIS — D509 Iron deficiency anemia, unspecified: Secondary | ICD-10-CM | POA: Diagnosis not present

## 2022-03-21 DIAGNOSIS — G20 Parkinson's disease: Secondary | ICD-10-CM | POA: Diagnosis not present

## 2022-03-21 DIAGNOSIS — I872 Venous insufficiency (chronic) (peripheral): Secondary | ICD-10-CM | POA: Diagnosis not present

## 2022-03-21 DIAGNOSIS — I10 Essential (primary) hypertension: Secondary | ICD-10-CM | POA: Diagnosis not present

## 2022-03-21 DIAGNOSIS — E039 Hypothyroidism, unspecified: Secondary | ICD-10-CM | POA: Diagnosis not present

## 2022-03-21 DIAGNOSIS — G8929 Other chronic pain: Secondary | ICD-10-CM | POA: Diagnosis not present

## 2022-03-24 DIAGNOSIS — Z7901 Long term (current) use of anticoagulants: Secondary | ICD-10-CM | POA: Diagnosis not present

## 2022-03-24 DIAGNOSIS — Z9181 History of falling: Secondary | ICD-10-CM | POA: Diagnosis not present

## 2022-03-24 DIAGNOSIS — I4891 Unspecified atrial fibrillation: Secondary | ICD-10-CM | POA: Diagnosis not present

## 2022-03-24 DIAGNOSIS — I872 Venous insufficiency (chronic) (peripheral): Secondary | ICD-10-CM | POA: Diagnosis not present

## 2022-03-24 DIAGNOSIS — M545 Low back pain, unspecified: Secondary | ICD-10-CM | POA: Diagnosis not present

## 2022-03-24 DIAGNOSIS — G8929 Other chronic pain: Secondary | ICD-10-CM | POA: Diagnosis not present

## 2022-03-24 DIAGNOSIS — I2699 Other pulmonary embolism without acute cor pulmonale: Secondary | ICD-10-CM | POA: Diagnosis not present

## 2022-03-24 DIAGNOSIS — I10 Essential (primary) hypertension: Secondary | ICD-10-CM | POA: Diagnosis not present

## 2022-03-24 DIAGNOSIS — D509 Iron deficiency anemia, unspecified: Secondary | ICD-10-CM | POA: Diagnosis not present

## 2022-03-24 DIAGNOSIS — G20 Parkinson's disease: Secondary | ICD-10-CM | POA: Diagnosis not present

## 2022-03-24 DIAGNOSIS — E119 Type 2 diabetes mellitus without complications: Secondary | ICD-10-CM | POA: Diagnosis not present

## 2022-03-24 DIAGNOSIS — E785 Hyperlipidemia, unspecified: Secondary | ICD-10-CM | POA: Diagnosis not present

## 2022-03-24 DIAGNOSIS — E039 Hypothyroidism, unspecified: Secondary | ICD-10-CM | POA: Diagnosis not present

## 2022-03-24 DIAGNOSIS — I214 Non-ST elevation (NSTEMI) myocardial infarction: Secondary | ICD-10-CM | POA: Diagnosis not present

## 2022-03-24 DIAGNOSIS — Z1612 Extended spectrum beta lactamase (ESBL) resistance: Secondary | ICD-10-CM | POA: Diagnosis not present

## 2022-03-24 DIAGNOSIS — N390 Urinary tract infection, site not specified: Secondary | ICD-10-CM | POA: Diagnosis not present

## 2022-03-28 DIAGNOSIS — G8929 Other chronic pain: Secondary | ICD-10-CM | POA: Diagnosis not present

## 2022-03-28 DIAGNOSIS — I10 Essential (primary) hypertension: Secondary | ICD-10-CM | POA: Diagnosis not present

## 2022-03-28 DIAGNOSIS — D509 Iron deficiency anemia, unspecified: Secondary | ICD-10-CM | POA: Diagnosis not present

## 2022-03-28 DIAGNOSIS — I2699 Other pulmonary embolism without acute cor pulmonale: Secondary | ICD-10-CM | POA: Diagnosis not present

## 2022-03-28 DIAGNOSIS — E785 Hyperlipidemia, unspecified: Secondary | ICD-10-CM | POA: Diagnosis not present

## 2022-03-28 DIAGNOSIS — I4891 Unspecified atrial fibrillation: Secondary | ICD-10-CM | POA: Diagnosis not present

## 2022-03-28 DIAGNOSIS — Z7901 Long term (current) use of anticoagulants: Secondary | ICD-10-CM | POA: Diagnosis not present

## 2022-03-28 DIAGNOSIS — M545 Low back pain, unspecified: Secondary | ICD-10-CM | POA: Diagnosis not present

## 2022-03-28 DIAGNOSIS — I872 Venous insufficiency (chronic) (peripheral): Secondary | ICD-10-CM | POA: Diagnosis not present

## 2022-03-28 DIAGNOSIS — G20 Parkinson's disease: Secondary | ICD-10-CM | POA: Diagnosis not present

## 2022-03-28 DIAGNOSIS — Z1612 Extended spectrum beta lactamase (ESBL) resistance: Secondary | ICD-10-CM | POA: Diagnosis not present

## 2022-03-28 DIAGNOSIS — I214 Non-ST elevation (NSTEMI) myocardial infarction: Secondary | ICD-10-CM | POA: Diagnosis not present

## 2022-03-28 DIAGNOSIS — Z9181 History of falling: Secondary | ICD-10-CM | POA: Diagnosis not present

## 2022-03-28 DIAGNOSIS — E039 Hypothyroidism, unspecified: Secondary | ICD-10-CM | POA: Diagnosis not present

## 2022-03-28 DIAGNOSIS — N390 Urinary tract infection, site not specified: Secondary | ICD-10-CM | POA: Diagnosis not present

## 2022-03-28 DIAGNOSIS — E119 Type 2 diabetes mellitus without complications: Secondary | ICD-10-CM | POA: Diagnosis not present

## 2022-03-28 NOTE — Progress Notes (Deleted)
    SUBJECTIVE:   CHIEF COMPLAINT / HPI:   Iron def anemia- offer iron infusion  PERTINENT  PMH / PSH: PE, paroxsymal Afib, HTN, T2DM, hypothyroidism,   OBJECTIVE:   There were no vitals taken for this visit.  ***  ASSESSMENT/PLAN:   No problem-specific Assessment & Plan notes found for this encounter.     Billey Co, MD [facility]

## 2022-03-29 ENCOUNTER — Ambulatory Visit: Payer: Medicare Other | Admitting: Family Medicine

## 2022-03-31 DIAGNOSIS — G8929 Other chronic pain: Secondary | ICD-10-CM | POA: Diagnosis not present

## 2022-03-31 DIAGNOSIS — I872 Venous insufficiency (chronic) (peripheral): Secondary | ICD-10-CM | POA: Diagnosis not present

## 2022-03-31 DIAGNOSIS — Z9181 History of falling: Secondary | ICD-10-CM | POA: Diagnosis not present

## 2022-03-31 DIAGNOSIS — Z1612 Extended spectrum beta lactamase (ESBL) resistance: Secondary | ICD-10-CM | POA: Diagnosis not present

## 2022-03-31 DIAGNOSIS — M545 Low back pain, unspecified: Secondary | ICD-10-CM | POA: Diagnosis not present

## 2022-03-31 DIAGNOSIS — Z7901 Long term (current) use of anticoagulants: Secondary | ICD-10-CM | POA: Diagnosis not present

## 2022-03-31 DIAGNOSIS — G20 Parkinson's disease: Secondary | ICD-10-CM | POA: Diagnosis not present

## 2022-03-31 DIAGNOSIS — I214 Non-ST elevation (NSTEMI) myocardial infarction: Secondary | ICD-10-CM | POA: Diagnosis not present

## 2022-03-31 DIAGNOSIS — E119 Type 2 diabetes mellitus without complications: Secondary | ICD-10-CM | POA: Diagnosis not present

## 2022-03-31 DIAGNOSIS — I4891 Unspecified atrial fibrillation: Secondary | ICD-10-CM | POA: Diagnosis not present

## 2022-03-31 DIAGNOSIS — I10 Essential (primary) hypertension: Secondary | ICD-10-CM | POA: Diagnosis not present

## 2022-03-31 DIAGNOSIS — E785 Hyperlipidemia, unspecified: Secondary | ICD-10-CM | POA: Diagnosis not present

## 2022-03-31 DIAGNOSIS — E039 Hypothyroidism, unspecified: Secondary | ICD-10-CM | POA: Diagnosis not present

## 2022-03-31 DIAGNOSIS — I2699 Other pulmonary embolism without acute cor pulmonale: Secondary | ICD-10-CM | POA: Diagnosis not present

## 2022-03-31 DIAGNOSIS — N390 Urinary tract infection, site not specified: Secondary | ICD-10-CM | POA: Diagnosis not present

## 2022-03-31 DIAGNOSIS — D509 Iron deficiency anemia, unspecified: Secondary | ICD-10-CM | POA: Diagnosis not present

## 2022-04-04 DIAGNOSIS — I2699 Other pulmonary embolism without acute cor pulmonale: Secondary | ICD-10-CM | POA: Diagnosis not present

## 2022-04-04 DIAGNOSIS — M545 Low back pain, unspecified: Secondary | ICD-10-CM | POA: Diagnosis not present

## 2022-04-04 DIAGNOSIS — Z7901 Long term (current) use of anticoagulants: Secondary | ICD-10-CM | POA: Diagnosis not present

## 2022-04-04 DIAGNOSIS — I872 Venous insufficiency (chronic) (peripheral): Secondary | ICD-10-CM | POA: Diagnosis not present

## 2022-04-04 DIAGNOSIS — Z1612 Extended spectrum beta lactamase (ESBL) resistance: Secondary | ICD-10-CM | POA: Diagnosis not present

## 2022-04-04 DIAGNOSIS — E119 Type 2 diabetes mellitus without complications: Secondary | ICD-10-CM | POA: Diagnosis not present

## 2022-04-04 DIAGNOSIS — E039 Hypothyroidism, unspecified: Secondary | ICD-10-CM | POA: Diagnosis not present

## 2022-04-04 DIAGNOSIS — I214 Non-ST elevation (NSTEMI) myocardial infarction: Secondary | ICD-10-CM | POA: Diagnosis not present

## 2022-04-04 DIAGNOSIS — I4891 Unspecified atrial fibrillation: Secondary | ICD-10-CM | POA: Diagnosis not present

## 2022-04-04 DIAGNOSIS — Z9181 History of falling: Secondary | ICD-10-CM | POA: Diagnosis not present

## 2022-04-04 DIAGNOSIS — E785 Hyperlipidemia, unspecified: Secondary | ICD-10-CM | POA: Diagnosis not present

## 2022-04-04 DIAGNOSIS — G20 Parkinson's disease: Secondary | ICD-10-CM | POA: Diagnosis not present

## 2022-04-04 DIAGNOSIS — N390 Urinary tract infection, site not specified: Secondary | ICD-10-CM | POA: Diagnosis not present

## 2022-04-04 DIAGNOSIS — I10 Essential (primary) hypertension: Secondary | ICD-10-CM | POA: Diagnosis not present

## 2022-04-04 DIAGNOSIS — D509 Iron deficiency anemia, unspecified: Secondary | ICD-10-CM | POA: Diagnosis not present

## 2022-04-04 DIAGNOSIS — G8929 Other chronic pain: Secondary | ICD-10-CM | POA: Diagnosis not present

## 2022-04-08 DIAGNOSIS — I872 Venous insufficiency (chronic) (peripheral): Secondary | ICD-10-CM | POA: Diagnosis not present

## 2022-04-08 DIAGNOSIS — G8929 Other chronic pain: Secondary | ICD-10-CM | POA: Diagnosis not present

## 2022-04-08 DIAGNOSIS — E039 Hypothyroidism, unspecified: Secondary | ICD-10-CM | POA: Diagnosis not present

## 2022-04-08 DIAGNOSIS — I214 Non-ST elevation (NSTEMI) myocardial infarction: Secondary | ICD-10-CM | POA: Diagnosis not present

## 2022-04-08 DIAGNOSIS — Z1612 Extended spectrum beta lactamase (ESBL) resistance: Secondary | ICD-10-CM | POA: Diagnosis not present

## 2022-04-08 DIAGNOSIS — I4891 Unspecified atrial fibrillation: Secondary | ICD-10-CM | POA: Diagnosis not present

## 2022-04-08 DIAGNOSIS — I2699 Other pulmonary embolism without acute cor pulmonale: Secondary | ICD-10-CM | POA: Diagnosis not present

## 2022-04-08 DIAGNOSIS — Z7901 Long term (current) use of anticoagulants: Secondary | ICD-10-CM | POA: Diagnosis not present

## 2022-04-08 DIAGNOSIS — N390 Urinary tract infection, site not specified: Secondary | ICD-10-CM | POA: Diagnosis not present

## 2022-04-08 DIAGNOSIS — Z9181 History of falling: Secondary | ICD-10-CM | POA: Diagnosis not present

## 2022-04-08 DIAGNOSIS — G20 Parkinson's disease: Secondary | ICD-10-CM | POA: Diagnosis not present

## 2022-04-08 DIAGNOSIS — D509 Iron deficiency anemia, unspecified: Secondary | ICD-10-CM | POA: Diagnosis not present

## 2022-04-08 DIAGNOSIS — E785 Hyperlipidemia, unspecified: Secondary | ICD-10-CM | POA: Diagnosis not present

## 2022-04-08 DIAGNOSIS — M545 Low back pain, unspecified: Secondary | ICD-10-CM | POA: Diagnosis not present

## 2022-04-08 DIAGNOSIS — I10 Essential (primary) hypertension: Secondary | ICD-10-CM | POA: Diagnosis not present

## 2022-04-08 DIAGNOSIS — E119 Type 2 diabetes mellitus without complications: Secondary | ICD-10-CM | POA: Diagnosis not present

## 2022-04-11 ENCOUNTER — Other Ambulatory Visit: Payer: Self-pay | Admitting: Family Medicine

## 2022-04-11 DIAGNOSIS — E039 Hypothyroidism, unspecified: Secondary | ICD-10-CM

## 2022-04-13 DIAGNOSIS — G8929 Other chronic pain: Secondary | ICD-10-CM | POA: Diagnosis not present

## 2022-04-13 DIAGNOSIS — I4891 Unspecified atrial fibrillation: Secondary | ICD-10-CM | POA: Diagnosis not present

## 2022-04-13 DIAGNOSIS — Z7901 Long term (current) use of anticoagulants: Secondary | ICD-10-CM | POA: Diagnosis not present

## 2022-04-13 DIAGNOSIS — M545 Low back pain, unspecified: Secondary | ICD-10-CM | POA: Diagnosis not present

## 2022-04-13 DIAGNOSIS — I214 Non-ST elevation (NSTEMI) myocardial infarction: Secondary | ICD-10-CM | POA: Diagnosis not present

## 2022-04-13 DIAGNOSIS — G20 Parkinson's disease: Secondary | ICD-10-CM | POA: Diagnosis not present

## 2022-04-13 DIAGNOSIS — E039 Hypothyroidism, unspecified: Secondary | ICD-10-CM | POA: Diagnosis not present

## 2022-04-13 DIAGNOSIS — N390 Urinary tract infection, site not specified: Secondary | ICD-10-CM | POA: Diagnosis not present

## 2022-04-13 DIAGNOSIS — E785 Hyperlipidemia, unspecified: Secondary | ICD-10-CM | POA: Diagnosis not present

## 2022-04-13 DIAGNOSIS — D509 Iron deficiency anemia, unspecified: Secondary | ICD-10-CM | POA: Diagnosis not present

## 2022-04-13 DIAGNOSIS — I10 Essential (primary) hypertension: Secondary | ICD-10-CM | POA: Diagnosis not present

## 2022-04-13 DIAGNOSIS — E119 Type 2 diabetes mellitus without complications: Secondary | ICD-10-CM | POA: Diagnosis not present

## 2022-04-13 DIAGNOSIS — Z1612 Extended spectrum beta lactamase (ESBL) resistance: Secondary | ICD-10-CM | POA: Diagnosis not present

## 2022-04-13 DIAGNOSIS — Z9181 History of falling: Secondary | ICD-10-CM | POA: Diagnosis not present

## 2022-04-13 DIAGNOSIS — I2699 Other pulmonary embolism without acute cor pulmonale: Secondary | ICD-10-CM | POA: Diagnosis not present

## 2022-04-13 DIAGNOSIS — I872 Venous insufficiency (chronic) (peripheral): Secondary | ICD-10-CM | POA: Diagnosis not present

## 2022-04-14 DIAGNOSIS — N390 Urinary tract infection, site not specified: Secondary | ICD-10-CM | POA: Diagnosis not present

## 2022-04-14 DIAGNOSIS — I10 Essential (primary) hypertension: Secondary | ICD-10-CM | POA: Diagnosis not present

## 2022-04-14 DIAGNOSIS — D509 Iron deficiency anemia, unspecified: Secondary | ICD-10-CM | POA: Diagnosis not present

## 2022-04-14 DIAGNOSIS — G20 Parkinson's disease: Secondary | ICD-10-CM | POA: Diagnosis not present

## 2022-04-14 DIAGNOSIS — Z7901 Long term (current) use of anticoagulants: Secondary | ICD-10-CM | POA: Diagnosis not present

## 2022-04-14 DIAGNOSIS — Z9181 History of falling: Secondary | ICD-10-CM | POA: Diagnosis not present

## 2022-04-14 DIAGNOSIS — I2699 Other pulmonary embolism without acute cor pulmonale: Secondary | ICD-10-CM | POA: Diagnosis not present

## 2022-04-14 DIAGNOSIS — E039 Hypothyroidism, unspecified: Secondary | ICD-10-CM | POA: Diagnosis not present

## 2022-04-14 DIAGNOSIS — E119 Type 2 diabetes mellitus without complications: Secondary | ICD-10-CM | POA: Diagnosis not present

## 2022-04-14 DIAGNOSIS — M545 Low back pain, unspecified: Secondary | ICD-10-CM | POA: Diagnosis not present

## 2022-04-14 DIAGNOSIS — I214 Non-ST elevation (NSTEMI) myocardial infarction: Secondary | ICD-10-CM | POA: Diagnosis not present

## 2022-04-14 DIAGNOSIS — I872 Venous insufficiency (chronic) (peripheral): Secondary | ICD-10-CM | POA: Diagnosis not present

## 2022-04-14 DIAGNOSIS — E785 Hyperlipidemia, unspecified: Secondary | ICD-10-CM | POA: Diagnosis not present

## 2022-04-14 DIAGNOSIS — G8929 Other chronic pain: Secondary | ICD-10-CM | POA: Diagnosis not present

## 2022-04-14 DIAGNOSIS — Z1612 Extended spectrum beta lactamase (ESBL) resistance: Secondary | ICD-10-CM | POA: Diagnosis not present

## 2022-04-14 DIAGNOSIS — I4891 Unspecified atrial fibrillation: Secondary | ICD-10-CM | POA: Diagnosis not present

## 2022-04-15 DIAGNOSIS — E119 Type 2 diabetes mellitus without complications: Secondary | ICD-10-CM | POA: Diagnosis not present

## 2022-04-15 DIAGNOSIS — I214 Non-ST elevation (NSTEMI) myocardial infarction: Secondary | ICD-10-CM | POA: Diagnosis not present

## 2022-04-15 DIAGNOSIS — G20 Parkinson's disease: Secondary | ICD-10-CM | POA: Diagnosis not present

## 2022-04-15 DIAGNOSIS — E785 Hyperlipidemia, unspecified: Secondary | ICD-10-CM | POA: Diagnosis not present

## 2022-04-15 DIAGNOSIS — Z7901 Long term (current) use of anticoagulants: Secondary | ICD-10-CM | POA: Diagnosis not present

## 2022-04-15 DIAGNOSIS — I872 Venous insufficiency (chronic) (peripheral): Secondary | ICD-10-CM | POA: Diagnosis not present

## 2022-04-15 DIAGNOSIS — D509 Iron deficiency anemia, unspecified: Secondary | ICD-10-CM | POA: Diagnosis not present

## 2022-04-15 DIAGNOSIS — Z9181 History of falling: Secondary | ICD-10-CM | POA: Diagnosis not present

## 2022-04-15 DIAGNOSIS — N390 Urinary tract infection, site not specified: Secondary | ICD-10-CM | POA: Diagnosis not present

## 2022-04-15 DIAGNOSIS — G8929 Other chronic pain: Secondary | ICD-10-CM | POA: Diagnosis not present

## 2022-04-15 DIAGNOSIS — I2699 Other pulmonary embolism without acute cor pulmonale: Secondary | ICD-10-CM | POA: Diagnosis not present

## 2022-04-15 DIAGNOSIS — I10 Essential (primary) hypertension: Secondary | ICD-10-CM | POA: Diagnosis not present

## 2022-04-15 DIAGNOSIS — I4891 Unspecified atrial fibrillation: Secondary | ICD-10-CM | POA: Diagnosis not present

## 2022-04-15 DIAGNOSIS — Z1612 Extended spectrum beta lactamase (ESBL) resistance: Secondary | ICD-10-CM | POA: Diagnosis not present

## 2022-04-15 DIAGNOSIS — E039 Hypothyroidism, unspecified: Secondary | ICD-10-CM | POA: Diagnosis not present

## 2022-04-15 DIAGNOSIS — M545 Low back pain, unspecified: Secondary | ICD-10-CM | POA: Diagnosis not present

## 2022-04-20 DIAGNOSIS — Z0279 Encounter for issue of other medical certificate: Secondary | ICD-10-CM

## 2022-04-21 DIAGNOSIS — G8929 Other chronic pain: Secondary | ICD-10-CM | POA: Diagnosis not present

## 2022-04-21 DIAGNOSIS — I4891 Unspecified atrial fibrillation: Secondary | ICD-10-CM | POA: Diagnosis not present

## 2022-04-21 DIAGNOSIS — I10 Essential (primary) hypertension: Secondary | ICD-10-CM | POA: Diagnosis not present

## 2022-04-21 DIAGNOSIS — I214 Non-ST elevation (NSTEMI) myocardial infarction: Secondary | ICD-10-CM | POA: Diagnosis not present

## 2022-04-21 DIAGNOSIS — I2699 Other pulmonary embolism without acute cor pulmonale: Secondary | ICD-10-CM | POA: Diagnosis not present

## 2022-04-21 DIAGNOSIS — M545 Low back pain, unspecified: Secondary | ICD-10-CM | POA: Diagnosis not present

## 2022-04-21 DIAGNOSIS — I872 Venous insufficiency (chronic) (peripheral): Secondary | ICD-10-CM | POA: Diagnosis not present

## 2022-04-21 DIAGNOSIS — G20 Parkinson's disease: Secondary | ICD-10-CM | POA: Diagnosis not present

## 2022-04-21 DIAGNOSIS — D509 Iron deficiency anemia, unspecified: Secondary | ICD-10-CM | POA: Diagnosis not present

## 2022-04-21 DIAGNOSIS — Z9181 History of falling: Secondary | ICD-10-CM | POA: Diagnosis not present

## 2022-04-21 DIAGNOSIS — Z7901 Long term (current) use of anticoagulants: Secondary | ICD-10-CM | POA: Diagnosis not present

## 2022-04-21 DIAGNOSIS — N390 Urinary tract infection, site not specified: Secondary | ICD-10-CM | POA: Diagnosis not present

## 2022-04-21 DIAGNOSIS — E119 Type 2 diabetes mellitus without complications: Secondary | ICD-10-CM | POA: Diagnosis not present

## 2022-04-21 DIAGNOSIS — E039 Hypothyroidism, unspecified: Secondary | ICD-10-CM | POA: Diagnosis not present

## 2022-04-21 DIAGNOSIS — Z1612 Extended spectrum beta lactamase (ESBL) resistance: Secondary | ICD-10-CM | POA: Diagnosis not present

## 2022-04-21 DIAGNOSIS — E785 Hyperlipidemia, unspecified: Secondary | ICD-10-CM | POA: Diagnosis not present

## 2022-04-22 ENCOUNTER — Encounter: Payer: Self-pay | Admitting: Neurology

## 2022-04-22 ENCOUNTER — Telehealth (INDEPENDENT_AMBULATORY_CARE_PROVIDER_SITE_OTHER): Payer: Medicare Other | Admitting: Neurology

## 2022-04-22 VITALS — Ht 61.0 in | Wt 150.0 lb

## 2022-04-22 DIAGNOSIS — E039 Hypothyroidism, unspecified: Secondary | ICD-10-CM | POA: Diagnosis not present

## 2022-04-22 DIAGNOSIS — I872 Venous insufficiency (chronic) (peripheral): Secondary | ICD-10-CM | POA: Diagnosis not present

## 2022-04-22 DIAGNOSIS — Z1612 Extended spectrum beta lactamase (ESBL) resistance: Secondary | ICD-10-CM | POA: Diagnosis not present

## 2022-04-22 DIAGNOSIS — D509 Iron deficiency anemia, unspecified: Secondary | ICD-10-CM | POA: Diagnosis not present

## 2022-04-22 DIAGNOSIS — G8929 Other chronic pain: Secondary | ICD-10-CM | POA: Diagnosis not present

## 2022-04-22 DIAGNOSIS — E785 Hyperlipidemia, unspecified: Secondary | ICD-10-CM | POA: Diagnosis not present

## 2022-04-22 DIAGNOSIS — G214 Vascular parkinsonism: Secondary | ICD-10-CM

## 2022-04-22 DIAGNOSIS — I214 Non-ST elevation (NSTEMI) myocardial infarction: Secondary | ICD-10-CM | POA: Diagnosis not present

## 2022-04-22 DIAGNOSIS — N390 Urinary tract infection, site not specified: Secondary | ICD-10-CM | POA: Diagnosis not present

## 2022-04-22 DIAGNOSIS — I4891 Unspecified atrial fibrillation: Secondary | ICD-10-CM | POA: Diagnosis not present

## 2022-04-22 DIAGNOSIS — I2699 Other pulmonary embolism without acute cor pulmonale: Secondary | ICD-10-CM | POA: Diagnosis not present

## 2022-04-22 DIAGNOSIS — Z9181 History of falling: Secondary | ICD-10-CM | POA: Diagnosis not present

## 2022-04-22 DIAGNOSIS — E119 Type 2 diabetes mellitus without complications: Secondary | ICD-10-CM | POA: Diagnosis not present

## 2022-04-22 DIAGNOSIS — M545 Low back pain, unspecified: Secondary | ICD-10-CM | POA: Diagnosis not present

## 2022-04-22 DIAGNOSIS — G20 Parkinson's disease: Secondary | ICD-10-CM | POA: Diagnosis not present

## 2022-04-22 DIAGNOSIS — Z7901 Long term (current) use of anticoagulants: Secondary | ICD-10-CM | POA: Diagnosis not present

## 2022-04-22 DIAGNOSIS — I10 Essential (primary) hypertension: Secondary | ICD-10-CM | POA: Diagnosis not present

## 2022-04-22 NOTE — Progress Notes (Signed)
Virtual Visit Via Video       Consent was obtained for video visit:  Yes.   Answered questions that patient had about telehealth interaction:  Yes.   I discussed the limitations, risks, security and privacy concerns of performing an evaluation and management service by telemedicine. I also discussed with the patient that there may be a patient responsible charge related to this service. The patient expressed understanding and agreed to proceed.  Pt location: Home Physician Location: office Name of referring provider:  Billey Co, MD I connected with Anna Bernard at patients initiation/request on 04/22/2022 at  9:45 AM EDT by video enabled telemedicine application and verified that I am speaking with the correct person using two identifiers. Pt MRN:  [id number] Pt DOB:  01/10/1951 Video Participants:  Anna Bernard;    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  This did not show efficacy of levodopa.  However, when I took the patient off of levodopa for 24 hours prior to seeing her today, in anticipation for levodopa challenge test, the patient and her husband reported that she did significantly worse off of the medication.  They would like to stay on the medication.  Daughter present today and we discussed these things in detail  -continue carbidopa/levodopa CR, 3 at 7am, 2 at 11 am and 2 at 4pm.    -We discussed differences between vascular parkinsonism and idiopathic Parkinson's disease.  She expressed understanding.  -FMLA forms filled out.  I did tell patient/daughter/husband that they really will need some documentation from the oncologist as well as the intermittent leave is partly due to husband's chemotherapy schedule.  They stated that they are working on getting that as well.  2.  Atrial fibrillation  -Patient spontaneously converted back to sinus rhythm, but had syncopal episode associated with dizziness when she  had RVR.  -Patient now on Eliquis    Subjective:   Anna Bernard was seen today in follow up for Parkinsons disease.  My previous records were reviewed prior to todays visit as well as outside records available to me. Husband and daughter accompany the pt and supplement the history.  They are here to discuss FMLA forms.  Daughter had to stop working July 17 to help to caregive for the patient.  Previously, her husband was caregiving.  Unfortunately, he was diagnosed with pancreatic cancer.  He has been undergoing a lot of doctors appointments and has started chemotherapy.  The daughter has been full-time caregiving.  She is planning on going back to work August 19.  After that, she will caregive only intermittently, when the patient has chemotherapy.  She thinks that that will still be 2 times every 14 days.  Current prescribed movement disorder medications: Carbidopa/levodopa 25/100 CR, 3 tablets at 7 AM/2 tablets at 11 AM/2 at 4pm  PREVIOUS MEDICATIONS: Sinemet  ALLERGIES:  No Known Allergies  CURRENT MEDICATIONS:  Outpatient Encounter Medications as of 04/22/2022  Medication Sig   acetaminophen (TYLENOL) 325 MG tablet Take 2 tablets (650 mg total) by mouth every 6 (six) hours as needed for mild pain or moderate pain.   Carbidopa-Levodopa ER (SINEMET CR) 25-100 MG tablet controlled release TAKE 3 TABS AT 7 AM, 2 TABS AT 11AM, 2 TABS AT 4PM   diclofenac Sodium (VOLTAREN) 1 % GEL APPLY 4 G TOPICALLY 4 TIMES DAILY   ferrous sulfate 325 (65 FE) MG tablet TAKE 1 TABLET BY MOUTH EVERY DAY  WITH BREAKFAST (Patient taking differently: 325 mg daily with breakfast.)   levothyroxine (SYNTHROID) 125 MCG tablet TAKE 1 TABLET (125 MCG TOTAL) BY MOUTH EVERY MORNING. 30 MINUTES BEFORE FOOD   lidocaine (LIDODERM) 5 % Place 1 patch onto the skin daily. Remove & Discard patch within 12 hours or as directed by MD   metoprolol tartrate (LOPRESSOR) 25 MG tablet Take 1 tablet (25 mg total) by mouth 2 (two) times  daily.   Multiple Vitamin (MULTIVITAMIN WITH MINERALS) TABS Take 1 tablet by mouth daily.   olmesartan (BENICAR) 20 MG tablet Take 1 tablet (20 mg total) by mouth daily.   polyethylene glycol powder (GLYCOLAX/MIRALAX) 17 GM/SCOOP powder Take 17 g by mouth 2 (two) times daily as needed for mild constipation or moderate constipation.   rosuvastatin (CRESTOR) 10 MG tablet Take 1 tablet (10 mg total) by mouth daily.   senna (SENOKOT) 8.6 MG TABS tablet Take 2 tablets by mouth daily.   apixaban (ELIQUIS) 5 MG TABS tablet Take 1 tablet (5 mg total) by mouth 2 (two) times daily. Start after treatment course of eliquis completed (10 mg BID eliquis course for 6 additional days) (Patient not taking: Reported on 04/22/2022)   melatonin 3 MG TABS tablet Take 3 mg by mouth at bedtime. (Patient not taking: Reported on 04/22/2022)   No facility-administered encounter medications on file as of 04/22/2022.    Objective:   PHYSICAL EXAMINATION:    VITALS:   Vitals:   04/22/22 0938  Weight: 150 lb (68 kg)  Height: 5\' 1"  (1.549 m)      GEN:  The patient appears stated age and is in NAD. HEENT:  Normocephalic, atraumatic.    Neurological examination:  Orientation: The patient is alert and oriented x3. Cranial nerves: There is good facial symmetry with facial hypomimia.  She would intermittently close 1 eye, but stated that she had no double vision.  She stated that she was purposefully closing it because she was sleepy.  The speech is fluent and clear, but she spoke very little today stating that she wanted her husband to provide his own history and the daughter spoke mostly today.  I have reviewed and interpreted the following labs independently    Chemistry      Component Value Date/Time   NA 142 02/08/2022 1517   K 4.4 02/08/2022 1517   CL 104 02/08/2022 1517   CO2 25 02/08/2022 1517   BUN 21 02/08/2022 1517   CREATININE 0.83 02/08/2022 1517   CREATININE 0.52 04/21/2013 1526   GLU 133 08/27/2012  0000      Component Value Date/Time   CALCIUM 8.7 02/08/2022 1517   ALKPHOS 49 01/16/2022 0522   AST 24 01/16/2022 0522   ALT <5 01/16/2022 0522   BILITOT 0.6 01/16/2022 0522   BILITOT 0.3 06/29/2021 1044       Lab Results  Component Value Date   WBC 5.3 03/02/2022   HGB 8.5 (L) 03/04/2022   HCT 26.6 (L) 03/04/2022   MCV 81 03/02/2022   PLT 313 03/02/2022    Lab Results  Component Value Date   TSH 0.157 (L) 03/02/2022   Follow up Instructions      -I discussed the assessment and treatment plan with the patient. The patient was provided an opportunity to ask questions and all were answered. The patient agreed with the plan and demonstrated an understanding of the instructions.   The patient was advised to call back or seek an in-person evaluation if the  symptoms worsen or if the condition fails to improve as anticipated.     Kerin Salen, DO   Cc:  Billey Co, MD

## 2022-04-25 DIAGNOSIS — H34831 Tributary (branch) retinal vein occlusion, right eye, with macular edema: Secondary | ICD-10-CM | POA: Diagnosis not present

## 2022-04-26 DIAGNOSIS — E119 Type 2 diabetes mellitus without complications: Secondary | ICD-10-CM | POA: Diagnosis not present

## 2022-04-26 DIAGNOSIS — I10 Essential (primary) hypertension: Secondary | ICD-10-CM | POA: Diagnosis not present

## 2022-04-26 DIAGNOSIS — M545 Low back pain, unspecified: Secondary | ICD-10-CM | POA: Diagnosis not present

## 2022-04-26 DIAGNOSIS — Z7901 Long term (current) use of anticoagulants: Secondary | ICD-10-CM | POA: Diagnosis not present

## 2022-04-26 DIAGNOSIS — Z1612 Extended spectrum beta lactamase (ESBL) resistance: Secondary | ICD-10-CM | POA: Diagnosis not present

## 2022-04-26 DIAGNOSIS — N390 Urinary tract infection, site not specified: Secondary | ICD-10-CM | POA: Diagnosis not present

## 2022-04-26 DIAGNOSIS — Z9181 History of falling: Secondary | ICD-10-CM | POA: Diagnosis not present

## 2022-04-26 DIAGNOSIS — G8929 Other chronic pain: Secondary | ICD-10-CM | POA: Diagnosis not present

## 2022-04-26 DIAGNOSIS — I2699 Other pulmonary embolism without acute cor pulmonale: Secondary | ICD-10-CM | POA: Diagnosis not present

## 2022-04-26 DIAGNOSIS — D509 Iron deficiency anemia, unspecified: Secondary | ICD-10-CM | POA: Diagnosis not present

## 2022-04-26 DIAGNOSIS — G20 Parkinson's disease: Secondary | ICD-10-CM | POA: Diagnosis not present

## 2022-04-26 DIAGNOSIS — I4891 Unspecified atrial fibrillation: Secondary | ICD-10-CM | POA: Diagnosis not present

## 2022-04-26 DIAGNOSIS — E785 Hyperlipidemia, unspecified: Secondary | ICD-10-CM | POA: Diagnosis not present

## 2022-04-26 DIAGNOSIS — I214 Non-ST elevation (NSTEMI) myocardial infarction: Secondary | ICD-10-CM | POA: Diagnosis not present

## 2022-04-26 DIAGNOSIS — I872 Venous insufficiency (chronic) (peripheral): Secondary | ICD-10-CM | POA: Diagnosis not present

## 2022-04-26 DIAGNOSIS — E039 Hypothyroidism, unspecified: Secondary | ICD-10-CM | POA: Diagnosis not present

## 2022-04-28 DIAGNOSIS — Z7901 Long term (current) use of anticoagulants: Secondary | ICD-10-CM | POA: Diagnosis not present

## 2022-04-28 DIAGNOSIS — D509 Iron deficiency anemia, unspecified: Secondary | ICD-10-CM | POA: Diagnosis not present

## 2022-04-28 DIAGNOSIS — I872 Venous insufficiency (chronic) (peripheral): Secondary | ICD-10-CM | POA: Diagnosis not present

## 2022-04-28 DIAGNOSIS — M545 Low back pain, unspecified: Secondary | ICD-10-CM | POA: Diagnosis not present

## 2022-04-28 DIAGNOSIS — I2699 Other pulmonary embolism without acute cor pulmonale: Secondary | ICD-10-CM | POA: Diagnosis not present

## 2022-04-28 DIAGNOSIS — E785 Hyperlipidemia, unspecified: Secondary | ICD-10-CM | POA: Diagnosis not present

## 2022-04-28 DIAGNOSIS — G8929 Other chronic pain: Secondary | ICD-10-CM | POA: Diagnosis not present

## 2022-04-28 DIAGNOSIS — I4891 Unspecified atrial fibrillation: Secondary | ICD-10-CM | POA: Diagnosis not present

## 2022-04-28 DIAGNOSIS — G20 Parkinson's disease: Secondary | ICD-10-CM | POA: Diagnosis not present

## 2022-04-28 DIAGNOSIS — Z9181 History of falling: Secondary | ICD-10-CM | POA: Diagnosis not present

## 2022-04-28 DIAGNOSIS — Z1612 Extended spectrum beta lactamase (ESBL) resistance: Secondary | ICD-10-CM | POA: Diagnosis not present

## 2022-04-28 DIAGNOSIS — N390 Urinary tract infection, site not specified: Secondary | ICD-10-CM | POA: Diagnosis not present

## 2022-04-28 DIAGNOSIS — I214 Non-ST elevation (NSTEMI) myocardial infarction: Secondary | ICD-10-CM | POA: Diagnosis not present

## 2022-04-28 DIAGNOSIS — E039 Hypothyroidism, unspecified: Secondary | ICD-10-CM | POA: Diagnosis not present

## 2022-04-28 DIAGNOSIS — E119 Type 2 diabetes mellitus without complications: Secondary | ICD-10-CM | POA: Diagnosis not present

## 2022-04-28 DIAGNOSIS — I10 Essential (primary) hypertension: Secondary | ICD-10-CM | POA: Diagnosis not present

## 2022-04-28 NOTE — Progress Notes (Signed)
    SUBJECTIVE:   CHIEF COMPLAINT / HPI:   Hypothyroidism- decreased levothyroxine to 125 mcg daily on 03/03/22, has not noticed a difference.  IDA- Hgb stable 8.5 last time, tried to call about IV iron and left VM. They never got voicemail, has been taking PO ferrous sulfate. Hgb 11.0 today. NO blood in stool or urine.  Vascular parkisonism- following with neurology. Seen at beginning at August. Unable to ambulate, needs family for transfer. On Sinemet.  PE- refilled Eliquis today. Hasn't been taking for a couple of months. Wasn't sure why she didn't get a refill. NO shortness of breath or chest pain or pleuritic chest pain.  Nocturia- 4-5 times per night. For past several months. Believe she is taking an antibiotic but unsure what it is. Only able to sleep 4 hours at night. No dysuria. Doesn't feel like shes drinking much. No caffeine. Trying to sit twice and do it. No fevers, chills, or flank pain. H/o ESBL UTI in the hospital treated with fosfomycin.   Wanting a home health aide. Needs help with transferring, medication assistance, and husband currently undergoing chemo and unable to provide care as he was before.  PERTINENT  PMH / PSH: h/o paroxsymal Afib, h/o PE, hypothyroidism, vascular parkinsonism, IDA with known chronic AVMs.  OBJECTIVE:   BP (!) 150/80   Pulse 84   Ht 5\' 1"  (1.549 m)   Wt 158 lb (71.7 kg)   SpO2 97%   BMI 29.85 kg/m   General: alert & oriented, no apparent distress, well groomed HEENT: normocephalic, atraumatic, EOM grossly intact, oral mucosa moist, neck supple,  Respiratory: normal respiratory effort, lungs cTAB Cardiac: RRR. No murmurs GI: non-distended, no CVA tenderness, no suprapubic tenderness, no guarding or rebound Skin: no rashes, no jaundice Psych: appropriate mood and affect   ASSESSMENT/PLAN:   Nocturia - POC urinalysis showing nitrites and leuks concerning for infection - ddx includes infection (has h/o ESBl UTI) as well as urge  incontinence/bladder spasm Not on any diuretics macrobid 100mg  bID x 7 days If sx persist after successful treatment of infection, consider Mybetriq vs Vesicare for bladder spasm  Iron deficiency anemia due to chronic blood loss POC Hgb today 11.0, continue PO ferrrous sulfate Taking Eliquis for recent PE although out for past month at least, refilled today, discussed signs/sx of bleeding to watch for  Pulmonary embolus (HCC) Refilled Eliquis  Hypothyroidism rechcking TSH today on new dose levothyroxine  History of ESBL E. coli infection Nocturia likely 2/2 infection, also considered urge incontinence Will treat with macrobid 100 mg BID x 7d based on previous culture results, discussed signs/sx of pyelonephritis but none today, sent urine culture, if not resolved may need IV antibiotics Consider if nocturia persists that urge incontinence playing a role- may trial Mybetriq vs Vesicare  Need for home health care Face to face completed for home health aide today     , MD [facility]

## 2022-04-29 ENCOUNTER — Encounter: Payer: Self-pay | Admitting: Family Medicine

## 2022-04-29 ENCOUNTER — Ambulatory Visit: Payer: Medicare Other | Admitting: Family Medicine

## 2022-04-29 ENCOUNTER — Other Ambulatory Visit: Payer: Self-pay | Admitting: Family Medicine

## 2022-04-29 VITALS — BP 150/80 | HR 84 | Ht 61.0 in | Wt 158.0 lb

## 2022-04-29 DIAGNOSIS — E039 Hypothyroidism, unspecified: Secondary | ICD-10-CM | POA: Diagnosis not present

## 2022-04-29 DIAGNOSIS — G20A1 Parkinson's disease without dyskinesia, without mention of fluctuations: Secondary | ICD-10-CM

## 2022-04-29 DIAGNOSIS — R351 Nocturia: Secondary | ICD-10-CM | POA: Diagnosis not present

## 2022-04-29 DIAGNOSIS — I2699 Other pulmonary embolism without acute cor pulmonale: Secondary | ICD-10-CM | POA: Diagnosis not present

## 2022-04-29 DIAGNOSIS — G20 Parkinson's disease: Secondary | ICD-10-CM

## 2022-04-29 DIAGNOSIS — D500 Iron deficiency anemia secondary to blood loss (chronic): Secondary | ICD-10-CM

## 2022-04-29 DIAGNOSIS — Z8619 Personal history of other infectious and parasitic diseases: Secondary | ICD-10-CM

## 2022-04-29 DIAGNOSIS — R350 Frequency of micturition: Secondary | ICD-10-CM

## 2022-04-29 DIAGNOSIS — Z742 Need for assistance at home and no other household member able to render care: Secondary | ICD-10-CM

## 2022-04-29 DIAGNOSIS — E079 Disorder of thyroid, unspecified: Secondary | ICD-10-CM

## 2022-04-29 DIAGNOSIS — M545 Low back pain, unspecified: Secondary | ICD-10-CM

## 2022-04-29 LAB — POCT URINALYSIS DIP (MANUAL ENTRY)
Bilirubin, UA: NEGATIVE
Blood, UA: NEGATIVE
Glucose, UA: NEGATIVE mg/dL
Ketones, POC UA: NEGATIVE mg/dL
Nitrite, UA: POSITIVE — AB
Protein Ur, POC: 30 mg/dL — AB
Spec Grav, UA: 1.015 (ref 1.010–1.025)
Urobilinogen, UA: 0.2 E.U./dL
pH, UA: 6.5 (ref 5.0–8.0)

## 2022-04-29 LAB — POCT UA - MICROSCOPIC ONLY: RBC, Urine, Miroscopic: NONE SEEN (ref 0–2)

## 2022-04-29 LAB — POCT HEMOGLOBIN: Hemoglobin: 11 g/dL (ref 11–14.6)

## 2022-04-29 MED ORDER — APIXABAN 5 MG PO TABS: 5.0000 mg | ORAL_TABLET | Freq: Two times a day (BID) | ORAL | 6 refills | Status: DC

## 2022-04-29 MED ORDER — NITROFURANTOIN MONOHYD MACRO 100 MG PO CAPS: 100.0000 mg | ORAL_CAPSULE | Freq: Two times a day (BID) | ORAL | 0 refills | Status: AC

## 2022-04-29 NOTE — Assessment & Plan Note (Signed)
Face to face completed for home health aide today

## 2022-04-29 NOTE — Assessment & Plan Note (Signed)
rechcking TSH today on new dose levothyroxine

## 2022-04-29 NOTE — Assessment & Plan Note (Signed)
Refilled Eliquis.

## 2022-04-29 NOTE — Assessment & Plan Note (Signed)
Nocturia likely 2/2 infection, also considered urge incontinence Will treat with macrobid 100 mg BID x 7d based on previous culture results, discussed signs/sx of pyelonephritis but none today, sent urine culture, if not resolved may need IV antibiotics Consider if nocturia persists that urge incontinence playing a role- may trial Mybetriq vs Vesicare

## 2022-04-29 NOTE — Assessment & Plan Note (Addendum)
POC Hgb today 11.0, continue PO ferrrous sulfate Taking Eliquis for recent PE although out for past month at least, refilled today, discussed signs/sx of bleeding to watch for

## 2022-04-29 NOTE — Patient Instructions (Addendum)
It was wonderful to see you today.  Please bring ALL of your medications with you to every visit.   Today we talked about:  We rechecked your thyroid today and kidney function We also checked your blood Hgb levels, they were 11.0 which is normal!  I refilled your Eliquis (blood thinner) today.  You look like you have a urinary tract infection. You have a history of growing bacteria in your urine, so if you start having fever, back or flank pain, or chills you need to go to the ED.  Thank you for choosing [facility].   Please call [phone] with any questions about today's appointment.  Please be sure to schedule follow up at the front  desk before you leave today.   Please arrive at least 15 minutes prior to your scheduled appointments.   If you had blood work today, I will send you a MyChart message or a letter if results are normal. Otherwise, I will give you a call.   If you had a referral placed, they will call you to set up an appointment. Please give [place] a call if you don't hear back in the next 2 weeks.   If you need additional refills before your next appointment, please call your pharmacy first.   Burley Saver, MD  Family Medicine

## 2022-04-29 NOTE — Assessment & Plan Note (Addendum)
-   POC urinalysis showing nitrites and leuks concerning for infection - ddx includes infection (has h/o ESBl UTI) as well as urge incontinence/bladder spasm Not on any diuretics macrobid 100mg  bID x 7 days If sx persist after successful treatment of infection, consider Mybetriq vs Vesicare for bladder spasm

## 2022-04-30 LAB — COMPREHENSIVE METABOLIC PANEL
ALT: 7 IU/L (ref 0–32)
AST: 21 IU/L (ref 0–40)
Albumin/Globulin Ratio: 1.6 (ref 1.2–2.2)
Albumin: 4.6 g/dL (ref 3.9–4.9)
Alkaline Phosphatase: 80 IU/L (ref 44–121)
BUN/Creatinine Ratio: 24 (ref 12–28)
BUN: 15 mg/dL (ref 8–27)
Bilirubin Total: 0.3 mg/dL (ref 0.0–1.2)
CO2: 27 mmol/L (ref 20–29)
Calcium: 9.4 mg/dL (ref 8.7–10.3)
Chloride: 101 mmol/L (ref 96–106)
Creatinine, Ser: 0.62 mg/dL (ref 0.57–1.00)
Globulin, Total: 2.8 g/dL (ref 1.5–4.5)
Glucose: 74 mg/dL (ref 70–99)
Potassium: 4.5 mmol/L (ref 3.5–5.2)
Sodium: 142 mmol/L (ref 134–144)
Total Protein: 7.4 g/dL (ref 6.0–8.5)
eGFR: 96 mL/min/{1.73_m2} (ref >59)

## 2022-04-30 LAB — TSH: TSH: 0.131 u[IU]/mL — ABNORMAL LOW (ref 0.450–4.500)

## 2022-05-02 ENCOUNTER — Telehealth: Payer: Self-pay | Admitting: Family Medicine

## 2022-05-02 DIAGNOSIS — E039 Hypothyroidism, unspecified: Secondary | ICD-10-CM

## 2022-05-02 LAB — URINE CULTURE

## 2022-05-02 MED ORDER — LEVOTHYROXINE SODIUM 100 MCG PO TABS: 100.0000 ug | ORAL_TABLET | ORAL | 3 refills | Status: DC

## 2022-05-02 NOTE — Telephone Encounter (Signed)
Called and verified speaking with Ms Vo. No fevers, chills or back/flank pain. No hematuria. No falls. Still urinating more.   Discussed her thyroid levels still suggesting overtreatment, they checked her home medications - she has been taking levothyroxine 125 mcg for past several months. Will decrease to daily, told him to place that medication aside and use the 100 mcg from now on, will repeat in 4-6 weeks.  Continue antibiotic until completed, growing GNRs and discussed I would call them when culture sensitivities return. Has close f/u with me in 2 weeks. No signs/sx of pyelonephritis at clinic visit and none per patient report today. Given ED/return precautions.  Burley Saver MD

## 2022-05-03 DIAGNOSIS — E785 Hyperlipidemia, unspecified: Secondary | ICD-10-CM | POA: Diagnosis not present

## 2022-05-03 DIAGNOSIS — Z9181 History of falling: Secondary | ICD-10-CM | POA: Diagnosis not present

## 2022-05-03 DIAGNOSIS — I872 Venous insufficiency (chronic) (peripheral): Secondary | ICD-10-CM | POA: Diagnosis not present

## 2022-05-03 DIAGNOSIS — Z7901 Long term (current) use of anticoagulants: Secondary | ICD-10-CM | POA: Diagnosis not present

## 2022-05-03 DIAGNOSIS — Z1612 Extended spectrum beta lactamase (ESBL) resistance: Secondary | ICD-10-CM | POA: Diagnosis not present

## 2022-05-03 DIAGNOSIS — I2699 Other pulmonary embolism without acute cor pulmonale: Secondary | ICD-10-CM | POA: Diagnosis not present

## 2022-05-03 DIAGNOSIS — I10 Essential (primary) hypertension: Secondary | ICD-10-CM | POA: Diagnosis not present

## 2022-05-03 DIAGNOSIS — D509 Iron deficiency anemia, unspecified: Secondary | ICD-10-CM | POA: Diagnosis not present

## 2022-05-03 DIAGNOSIS — E119 Type 2 diabetes mellitus without complications: Secondary | ICD-10-CM | POA: Diagnosis not present

## 2022-05-03 DIAGNOSIS — I214 Non-ST elevation (NSTEMI) myocardial infarction: Secondary | ICD-10-CM | POA: Diagnosis not present

## 2022-05-03 DIAGNOSIS — G8929 Other chronic pain: Secondary | ICD-10-CM | POA: Diagnosis not present

## 2022-05-03 DIAGNOSIS — E039 Hypothyroidism, unspecified: Secondary | ICD-10-CM | POA: Diagnosis not present

## 2022-05-03 DIAGNOSIS — M545 Low back pain, unspecified: Secondary | ICD-10-CM | POA: Diagnosis not present

## 2022-05-03 DIAGNOSIS — G20 Parkinson's disease: Secondary | ICD-10-CM | POA: Diagnosis not present

## 2022-05-03 DIAGNOSIS — N390 Urinary tract infection, site not specified: Secondary | ICD-10-CM | POA: Diagnosis not present

## 2022-05-03 DIAGNOSIS — I4891 Unspecified atrial fibrillation: Secondary | ICD-10-CM | POA: Diagnosis not present

## 2022-05-05 NOTE — Progress Notes (Signed)
    SUBJECTIVE:   CHIEF COMPLAINT / HPI:   Foot pain- recently referred to podiatry by her cardiologist. Right second toe so curled her nail has fallen off and when she tries to walk on it it hurts.  ESBL UTI- s/p Macrobid, sensitive per cultures Ecoli to [employer]. Still having nocturia. Sometimes has urge to urinate but no incontinence. No stress incontinence. No fevers, chills, flank pain. No hematuria, no blood in stool, no signs of bleeding.  Epigastric pain - After she eats, epigastric pain. A month or two. Only after eating, not worse with exercise. Takes a Tagamot (cimetidine) and then it goes away. Wondering what else she can take. Some early satiety, only eats two small meals a day. Has had weight loss in the past year. No night sweats.    OBJECTIVE:   BP (!) 156/82   Pulse 80   Ht 5\' 1"  (1.549 m)   SpO2 99%   BMI 29.48 kg/m   General: A&O, NAD HEENT: No sign of trauma, EOM grossly intact Cardiac: RRR, no m/r/g Respiratory: CTAB, normal WOB, no w/c/r GI: Soft, NTTP, non-distended , no guarding or rebound Extremities:mild R 1+ edema, no erythema or warmth, R second toe curled under with loss of toe nail, no skin breakdown Neuro: in wheelchair, moves all four extremities appropriately. Psych: Appropriate mood and affect   ASSESSMENT/PLAN:   Encounter for screening mammogram for breast cancer Shared decisino making as she is > 69 yo, she elects for screening mammogram, sheet given to call to schedule  History of ESBL E. coli infection Still with increased frequency and nocturia but no other signs of infection, will get urine TOC today  Nocturia Referral to urology as she has had frequent infection, [facility] in 2019 but no cystography performed  Gastroesophageal reflux disease Trial of PRN pepcid, however since worse in the last few months with some associated weight loss, discussed referral to GI for potential EGD to rule out malignancy, EGD in 2019 was without  stomach abnormality or lesion, referral placed today     2020, MD [facility]

## 2022-05-06 DIAGNOSIS — I214 Non-ST elevation (NSTEMI) myocardial infarction: Secondary | ICD-10-CM | POA: Diagnosis not present

## 2022-05-06 DIAGNOSIS — D509 Iron deficiency anemia, unspecified: Secondary | ICD-10-CM | POA: Diagnosis not present

## 2022-05-06 DIAGNOSIS — I10 Essential (primary) hypertension: Secondary | ICD-10-CM | POA: Diagnosis not present

## 2022-05-06 DIAGNOSIS — E039 Hypothyroidism, unspecified: Secondary | ICD-10-CM | POA: Diagnosis not present

## 2022-05-06 DIAGNOSIS — E119 Type 2 diabetes mellitus without complications: Secondary | ICD-10-CM | POA: Diagnosis not present

## 2022-05-06 DIAGNOSIS — G8929 Other chronic pain: Secondary | ICD-10-CM | POA: Diagnosis not present

## 2022-05-06 DIAGNOSIS — Z1612 Extended spectrum beta lactamase (ESBL) resistance: Secondary | ICD-10-CM | POA: Diagnosis not present

## 2022-05-06 DIAGNOSIS — N390 Urinary tract infection, site not specified: Secondary | ICD-10-CM | POA: Diagnosis not present

## 2022-05-06 DIAGNOSIS — I2699 Other pulmonary embolism without acute cor pulmonale: Secondary | ICD-10-CM | POA: Diagnosis not present

## 2022-05-06 DIAGNOSIS — I872 Venous insufficiency (chronic) (peripheral): Secondary | ICD-10-CM | POA: Diagnosis not present

## 2022-05-06 DIAGNOSIS — E785 Hyperlipidemia, unspecified: Secondary | ICD-10-CM | POA: Diagnosis not present

## 2022-05-06 DIAGNOSIS — G20 Parkinson's disease: Secondary | ICD-10-CM | POA: Diagnosis not present

## 2022-05-06 DIAGNOSIS — I4891 Unspecified atrial fibrillation: Secondary | ICD-10-CM | POA: Diagnosis not present

## 2022-05-06 DIAGNOSIS — Z7901 Long term (current) use of anticoagulants: Secondary | ICD-10-CM | POA: Diagnosis not present

## 2022-05-06 DIAGNOSIS — Z9181 History of falling: Secondary | ICD-10-CM | POA: Diagnosis not present

## 2022-05-06 DIAGNOSIS — M545 Low back pain, unspecified: Secondary | ICD-10-CM | POA: Diagnosis not present

## 2022-05-09 DIAGNOSIS — I10 Essential (primary) hypertension: Secondary | ICD-10-CM | POA: Diagnosis not present

## 2022-05-09 DIAGNOSIS — N390 Urinary tract infection, site not specified: Secondary | ICD-10-CM | POA: Diagnosis not present

## 2022-05-09 DIAGNOSIS — E039 Hypothyroidism, unspecified: Secondary | ICD-10-CM | POA: Diagnosis not present

## 2022-05-09 DIAGNOSIS — E785 Hyperlipidemia, unspecified: Secondary | ICD-10-CM | POA: Diagnosis not present

## 2022-05-09 DIAGNOSIS — E119 Type 2 diabetes mellitus without complications: Secondary | ICD-10-CM | POA: Diagnosis not present

## 2022-05-09 DIAGNOSIS — I872 Venous insufficiency (chronic) (peripheral): Secondary | ICD-10-CM | POA: Diagnosis not present

## 2022-05-09 DIAGNOSIS — D509 Iron deficiency anemia, unspecified: Secondary | ICD-10-CM | POA: Diagnosis not present

## 2022-05-09 DIAGNOSIS — M545 Low back pain, unspecified: Secondary | ICD-10-CM | POA: Diagnosis not present

## 2022-05-09 DIAGNOSIS — Z7901 Long term (current) use of anticoagulants: Secondary | ICD-10-CM | POA: Diagnosis not present

## 2022-05-09 DIAGNOSIS — I214 Non-ST elevation (NSTEMI) myocardial infarction: Secondary | ICD-10-CM | POA: Diagnosis not present

## 2022-05-09 DIAGNOSIS — G20 Parkinson's disease: Secondary | ICD-10-CM | POA: Diagnosis not present

## 2022-05-09 DIAGNOSIS — I2699 Other pulmonary embolism without acute cor pulmonale: Secondary | ICD-10-CM | POA: Diagnosis not present

## 2022-05-09 DIAGNOSIS — Z1612 Extended spectrum beta lactamase (ESBL) resistance: Secondary | ICD-10-CM | POA: Diagnosis not present

## 2022-05-09 DIAGNOSIS — G8929 Other chronic pain: Secondary | ICD-10-CM | POA: Diagnosis not present

## 2022-05-09 DIAGNOSIS — Z9181 History of falling: Secondary | ICD-10-CM | POA: Diagnosis not present

## 2022-05-09 DIAGNOSIS — I4891 Unspecified atrial fibrillation: Secondary | ICD-10-CM | POA: Diagnosis not present

## 2022-05-12 ENCOUNTER — Ambulatory Visit: Payer: Medicare Other | Admitting: Cardiology

## 2022-05-16 ENCOUNTER — Encounter: Payer: Self-pay | Admitting: Physician Assistant

## 2022-05-16 DIAGNOSIS — Z1612 Extended spectrum beta lactamase (ESBL) resistance: Secondary | ICD-10-CM | POA: Diagnosis not present

## 2022-05-16 DIAGNOSIS — I872 Venous insufficiency (chronic) (peripheral): Secondary | ICD-10-CM | POA: Diagnosis not present

## 2022-05-16 DIAGNOSIS — I10 Essential (primary) hypertension: Secondary | ICD-10-CM | POA: Diagnosis not present

## 2022-05-16 DIAGNOSIS — I214 Non-ST elevation (NSTEMI) myocardial infarction: Secondary | ICD-10-CM | POA: Diagnosis not present

## 2022-05-16 DIAGNOSIS — N390 Urinary tract infection, site not specified: Secondary | ICD-10-CM | POA: Diagnosis not present

## 2022-05-16 DIAGNOSIS — E039 Hypothyroidism, unspecified: Secondary | ICD-10-CM | POA: Diagnosis not present

## 2022-05-16 DIAGNOSIS — I2699 Other pulmonary embolism without acute cor pulmonale: Secondary | ICD-10-CM | POA: Diagnosis not present

## 2022-05-16 DIAGNOSIS — M545 Low back pain, unspecified: Secondary | ICD-10-CM | POA: Diagnosis not present

## 2022-05-16 DIAGNOSIS — E119 Type 2 diabetes mellitus without complications: Secondary | ICD-10-CM | POA: Diagnosis not present

## 2022-05-16 DIAGNOSIS — E785 Hyperlipidemia, unspecified: Secondary | ICD-10-CM | POA: Diagnosis not present

## 2022-05-16 DIAGNOSIS — Z7901 Long term (current) use of anticoagulants: Secondary | ICD-10-CM | POA: Diagnosis not present

## 2022-05-16 DIAGNOSIS — G20 Parkinson's disease: Secondary | ICD-10-CM | POA: Diagnosis not present

## 2022-05-16 DIAGNOSIS — I4891 Unspecified atrial fibrillation: Secondary | ICD-10-CM | POA: Diagnosis not present

## 2022-05-16 DIAGNOSIS — Z9181 History of falling: Secondary | ICD-10-CM | POA: Diagnosis not present

## 2022-05-16 DIAGNOSIS — G8929 Other chronic pain: Secondary | ICD-10-CM | POA: Diagnosis not present

## 2022-05-16 DIAGNOSIS — D509 Iron deficiency anemia, unspecified: Secondary | ICD-10-CM | POA: Diagnosis not present

## 2022-05-16 NOTE — Progress Notes (Deleted)
Cardiology Office Note    Date:  05/16/2022   ID:  Anna Bernard, DOB [birth year], MRN [id number]  PCP:  [name], MD  Cardiologist:  Fransico Him, MD  Electrophysiologist:  None   Chief Complaint: ***  History of Present Illness:   Anna Bernard is a 71 y.o. female with history of hyperlipidemia (followed by PCP), hypertension, prediabetes, Parkinson's disease, vitamin B12 deficiency, microcytic anemia, chronic low back pain, falls, atrial fibrillation, PE, mild carotid artery disease, thyroid disease, and possible right subclavian artery stenosis who is seen for follow-up.  She was admitted 12/2021 with falls/dizziness found to have AKI in setting of sepsis 2nd to UTI, new onset AF RVR, elevated troponin, and PE. From afib standpoint, she spontaneously converted to NSR on her own. Troponin peaked at 553 prompting coronary CTA that showed only mild CAD but did incidentally pick up nonocclusive right sided PE. 2D echo 01/17/22 EF 60-65%, mild HK of left ventricular, basal inferolateral wall, moderate MAC, aortic sclerosis without stenosis, cannot exclude PFO; no visual change from prior. She was treated with VTE dosing Eliquis at discharge. She required discharge to [facility] for rehab. She was also to f/u PCP for abnormal TSH. She has longstanding hsitroy of R>L lower extremity edema. She saw [facility] for this issue many years ago. Deep reflux with possible component of lymphedema was suspected at that time per notes and conservative measures were advised. Carotid duplex 0/2774 1-28% RICA + LICA, disturbed R subclavian flow with normal L and antegrade vertebral flow  Bilat BP Thyroiddz followed by pcp - they were unable to reach her  Paroxysmal atrial fibrillation Mild CAD Pulmonary embolism Possible PFO Lower extremity edema Mild carotid artery disease, possible right subclavian artery stenosis   Labwork independently reviewed: 04/2022 CMET OK Cr 0.62, K 4.5, TSH 0.131 (followed  by primary care), Hgb 11.0 02/2022 Hgb 8.5, free T4 1.85, TSH 0.157 12/2021 K 3.9, CR 1.08, albumin 3.3, Hgb 9.5, plt 285, LDL 72, trig 65, TSH 6.892     Cardiology Studies:   Studies reviewed are outlined and summarized above. Reports included below if pertinent.   Carotid study 02/28/22   Summary:  Right Carotid: Velocities in the right ICA are consistent with a 1-39%  stenosis.   Left Carotid: Velocities in the left ICA are consistent with a 1-39%  stenosis.   Vertebrals:  Bilateral vertebral arteries demonstrate antegrade flow.  Subclavians: Right subclavian artery flow was disturbed. Normal flow               hemodynamics were seen in the left subclavian artery.   *See table(s) above for measurements and observations.   Cor CT 01/18/22 FINDINGS: A 100 kV prospective scan was triggered in the descending thoracic aorta at 111 HU's. Axial non-contrast 3 mm slices were carried out through the heart. The data set was analyzed on a dedicated work station and scored using the [place]. Gantry rotation speed was 250 msecs and collimation was .6 mm. No beta blockade and 0.8 mg of sl NTG was given. The 3D data set was reconstructed in 5% intervals of the 67-82 % of the R-R cycle. Diastolic phases were analyzed on a dedicated work station using MPR, MIP and VRT modes. The patient received 80 cc of contrast.   Aorta: Normal size.  Aortic root calcifications.  No dissection.   Aortic Valve:  Trileaflet.  Mild calcifications.   Coronary Arteries:  Normal coronary origin.  Right dominance.  RCA is a large dominant artery that gives rise to PDA and PLA. There is no plaque.   Left main is a large artery that gives rise to LAD and LCX arteries.   LAD is a large vessel that has no plaque.   LCX is a non-dominant artery that gives rise to one large OM1 branch. There is mild (25-49%) calcified plaque in the proximal LCX. The mid and distal LCX with no plaques.   Coronary  Calcium Score:   Left main: 0   Left anterior descending artery: 0   Left circumflex artery: 212   Right coronary artery: 0   Total: 212   Percentile: 84   Other findings:   Normal pulmonary vein drainage into the left atrium.   Normal left atrial appendage without a thrombus.   Normal size of the pulmonary artery.   Moderate mitral annular calcification.   IMPRESSION: 1. Coronary calcium score of 212. This was 34 percentile for age and sex matched control.   2. Normal coronary origin with right dominance.   3. CAD-RADS 2. Mild non-obstructive CAD (25-49%). Consider non-atherosclerotic causes of chest pain. Consider preventive therapy and risk factor modification.   4. Moderate mitral annular calcification.   The noncardiac portion of this study will be interpreted in separate report by the radiologist.     Electronically Signed   By: Berniece Salines D.O.   On: 01/18/2022 15:07      Echo 01/17/22    1. Left ventricular ejection fraction, by estimation, is 60 to 65%. The  left ventricle has normal function. The left ventricle demonstrates  regional wall motion abnormalities (see scoring diagram/findings for  description). There is mild left ventricular   hypertrophy. Left ventricular diastolic parameters were normal. There is  mild hypokinesis of the left ventricular, basal inferolateral wall.   2. Right ventricular systolic function is normal. The right ventricular  size is normal.   3. Left atrial size was moderately dilated.   4. The mitral valve is normal in structure. Trivial mitral valve  regurgitation. No evidence of mitral stenosis. Moderate mitral annular  calcification.   5. The aortic valve is normal in structure. There is mild calcification  of the aortic valve. Aortic valve regurgitation is trivial. Aortic valve  sclerosis is present, with no evidence of aortic valve stenosis.   6. The inferior vena cava is normal in size with <50% respiratory   variability, suggesting right atrial pressure of 8 mmHg.   7. Cannot exclude a small PFO.   Comparison(s): No significant change from prior study. Prior images  reviewed side by side. Mild hypokinesis of basal inferolateral wall seen  on current and prior study, no visual change between studies.        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;  Surgeon: Jalene Mullet, MD;  Location: [place];  Service: Ophthalmology;  Laterality: Right;   PARS PLANA VITRECTOMY Right 09/29/2020   Procedure: PARS PLANA VITRECTOMY WITH 25 GAUGE - ENDOCAUTRY;  Surgeon: Jalene Mullet, MD;  Location: [place];  Service: Ophthalmology;  Laterality: Right;   PHOTOCOAGULATION WITH LASER Right 08/16/2020   Procedure: PHOTOCOAGULATION WITH LASER;  Surgeon: Jalene Mullet, MD;  Location: [place];  Service: Ophthalmology;  Laterality: Right;   PHOTOCOAGULATION WITH LASER Right 09/29/2020   Procedure: PHOTOCOAGULATION WITH LASER;  Surgeon: Jalene Mullet, MD;  Location: [place];  Service: Ophthalmology;  Laterality: Right;   SHOULDER SURGERY     TONSILLECTOMY     TUBAL LIGATION      Current Medications: No outpatient medications have been marked as taking for the 05/17/22 encounter (Appointment) with Charlie Pitter, PA-C.   ***   Allergies:   Patient has no known allergies.   Social History   Socioeconomic History    Marital status: Married    Spouse name: Not on file   Number of children: Not on file   Years of education: Not on file   Highest education level: Not on file  Occupational History   Not on file  Tobacco Use   Smoking status: Former    Passive exposure: Past   Smokeless tobacco: Never   Tobacco comments:    quit 30+ years ago  Vaping Use   Vaping Use: Never used  Substance and Sexual Activity   Alcohol use: No    Alcohol/week: 0.0 standard drinks of alcohol   Drug use: No   Sexual activity: Not Currently  Other Topics Concern   Not on file  Social History Narrative   Right handed   Lives with husband in a one story home.   Caffeine once in a while   Social Determinants of Health   Financial Resource Strain: Not on file  Food Insecurity: Not on file  Transportation Needs: Not on file  Physical Activity: Not on file  Stress: Not on file  Social Connections: Not on file     Family History:  The patient's ***family history includes Cancer in her sister; Renal Disease in her sister.  ROS:   Please see the history of present illness. Otherwise, review of systems is positive for ***.  All other systems are reviewed and otherwise negative.    EKG(s)/Additional Labs   EKG:  EKG is ordered today, personally reviewed, demonstrating ***  Recent Labs: 09/28/2021: BNP 20.1; Magnesium 2.2 03/02/2022: Platelets 313 04/29/2022: ALT 7; BUN 15; Creatinine, Ser 0.62; Hemoglobin 11.0; Potassium 4.5; Sodium 142; TSH 0.131  Recent Lipid Panel    Component Value Date/Time   CHOL 133 01/17/2022 0234   CHOL 160 06/29/2021 1044   TRIG 65 01/17/2022 0234   HDL 48 01/17/2022 0234   HDL 69 06/29/2021 1044   CHOLHDL 2.8 01/17/2022 0234   VLDL 13 01/17/2022 0234   LDLCALC 72 01/17/2022 0234   LDLCALC 79 06/29/2021 1044    PHYSICAL EXAM:    VS:  There were no vitals taken for this visit.  BMI: There is no height or weight on file to calculate BMI.  GEN: Well nourished, well  developed female in no acute distress HEENT: normocephalic, atraumatic Neck: no JVD, carotid bruits, or masses Cardiac: ***RRR; no murmurs, rubs, or gallops, no edema  Respiratory:  clear to auscultation bilaterally, normal work of breathing GI: soft, nontender, nondistended, + BS MS: no deformity or atrophy Skin: warm and dry, no rash Neuro:  Alert and Oriented x  3, Strength and sensation are intact, follows commands Psych: euthymic mood, full affect  Wt Readings from Last 3 Encounters:  04/29/22 158 lb (71.7 kg)  04/22/22 150 lb (68 kg)  03/02/22 159 lb (72.1 kg)     ASSESSMENT & PLAN:   ***     Disposition: F/u with ***   Medication Adjustments/Labs and Tests Ordered: Current medicines are reviewed at length with the patient today.  Concerns regarding medicines are outlined above. Medication changes, Labs and Tests ordered today are summarized above and listed in the Patient Instructions accessible in Encounters.   Signed, Charlie Pitter, PA-C  05/16/2022 10:31 AM    [place] Phone: [phone]; Fax: [phone]

## 2022-05-17 ENCOUNTER — Encounter: Payer: Self-pay | Admitting: Physician Assistant

## 2022-05-17 ENCOUNTER — Ambulatory Visit: Payer: Medicare Other | Attending: Cardiology | Admitting: Physician Assistant

## 2022-05-17 ENCOUNTER — Ambulatory Visit: Payer: Medicare Other | Admitting: Physician Assistant

## 2022-05-17 VITALS — BP 108/68 | HR 71 | Ht 61.0 in | Wt 156.0 lb

## 2022-05-17 DIAGNOSIS — I2510 Atherosclerotic heart disease of native coronary artery without angina pectoris: Secondary | ICD-10-CM

## 2022-05-17 DIAGNOSIS — D649 Anemia, unspecified: Secondary | ICD-10-CM

## 2022-05-17 DIAGNOSIS — I480 Paroxysmal atrial fibrillation: Secondary | ICD-10-CM

## 2022-05-17 DIAGNOSIS — R0989 Other specified symptoms and signs involving the circulatory and respiratory systems: Secondary | ICD-10-CM

## 2022-05-17 DIAGNOSIS — Q2112 Patent foramen ovale: Secondary | ICD-10-CM

## 2022-05-17 DIAGNOSIS — R600 Localized edema: Secondary | ICD-10-CM

## 2022-05-17 DIAGNOSIS — I10 Essential (primary) hypertension: Secondary | ICD-10-CM

## 2022-05-17 DIAGNOSIS — I779 Disorder of arteries and arterioles, unspecified: Secondary | ICD-10-CM

## 2022-05-17 DIAGNOSIS — I2699 Other pulmonary embolism without acute cor pulmonale: Secondary | ICD-10-CM

## 2022-05-17 DIAGNOSIS — I771 Stricture of artery: Secondary | ICD-10-CM

## 2022-05-17 DIAGNOSIS — M2040 Other hammer toe(s) (acquired), unspecified foot: Secondary | ICD-10-CM | POA: Diagnosis not present

## 2022-05-17 NOTE — Patient Instructions (Signed)
Medication Instructions:  Your physician recommends that you continue on your current medications as directed. Please refer to the Current Medication list given to you today.  *If you need a refill on your cardiac medications before your next appointment, please call your pharmacy*   Lab Work: None If you have labs (blood work) drawn today and your tests are completely normal, you will receive your results only by: MyChart Message (if you have MyChart) OR A paper copy in the mail If you have any lab test that is abnormal or we need to change your treatment, we will call you to review the results.  Follow-Up: At [facility], you and your health needs are our priority.  As part of our continuing mission to provide you with exceptional heart care, we have created designated Provider Care Teams.  These Care Teams include your primary Cardiologist (physician) and Advanced Practice Providers (APPs -  Physician Assistants and Nurse Practitioners) who all work together to provide you with the care you need, when you need it.  Your next appointment:   6 month(s)  The format for your next appointment:   In Person  Provider:   Armanda Magic, MD  or Jari Favre, PA-C or Ronie Spies, PA-C       Other Instructions You have been referred to see podiatry.    Important Information About Sugar

## 2022-05-17 NOTE — Progress Notes (Signed)
Office Visit    Patient Name: Anna Bernard Date of Encounter: 05/17/2022  PCP:  [name], [place]  Cardiologist:  Fransico Him, MD  Advanced Practice Provider:  No care team member to display Electrophysiologist:  None   HPI    Anna Bernard is a 71 y.o. female with past medical history significant for hyperlipidemia (followed by PCP, hypertension, prediabetes, Parkinson's disease, vitamin B12 deficiency, microcytic anemia, chronic low back pain, and multiple falls, recently admitted for fall/dizziness and found to have AKI in the setting of sepsis secondary to UTI, new onset atrial fibrillation with RVR, elevated troponin, and PE presents today for follow-up appointment.  From a atrial fibrillation standpoint, she spontaneously converted to normal sinus rhythm on her own.  Troponin peaked at 553 prompting a coronary CTA that showed only mild CAD but did incidentally pick up nonocclusive right-sided PE.  2D echocardiogram 01/17/2022 with a EF 60 to 65%, mild HK of the left ventricle, basal inferolateral wall, moderate MAC, aortic sclerosis without stenosis, cannot exclude PFO, no visual changes from prior.  Treated with VTE dosing Eliquis at discharge.  Discharged [facility] for rehab.  She was seen for posthospital follow-up by Melina Copa, PA on 02/08/2022.  She denied any recent cardiac symptoms.  She did have some generalized weakness.  She had not started any rehab exercises at the facility yet.  Husband reported longstanding history of dependent lower extremity edema right greater than left.  Historically has been responsive to compression hose.  She saw [facility] many years ago and was diagnosed with a deep reflux with possible component of lymphedema and conservative measures were advised.  Today, she presents for 90-monthfollow-up visit.  Today, she tells me that she is panting when she eats and when she is walking around.  We reviewed her CT scan,  echocardiogram, and carotid ultrasound from May all of which were unremarkable.  She was found to have some mild nonobstructive CAD on CT scan, mild carotid disease on [place]  She does struggle with right lower leg edema which is chronic for her.  She did have a recent PE which could be contributing to her shortness of breath.  She is on anticoagulation with Eliquis with no bleeding issues.  Reports no chest pain, pressure, or tightness. No edema, orthopnea, PND. Reports no palpitations.    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 by duplex 2023   Vitamin B12 deficiency    Past Surgical History:  Procedure Laterality Date   ABDOMINAL HYSTERECTOMY     GIVENS CAPSULE STUDY N/A 04/16/2018   Procedure: GIVENS CAPSULE STUDY;  Surgeon: HCarol Ada MD;  [place]  Service: Endoscopy;  Laterality: N/A;   PARS PLANA VITRECTOMY Right 08/16/2020   Procedure: 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;  Surgeon: PJalene Mullet MD;  [place]  Service: Ophthalmology;  Laterality: Right;   PARS PLANA VITRECTOMY Right 09/29/2020   Procedure: PARS PLANA VITRECTOMY WITH 25 GAUGE - ENDOCAUTRY;  Surgeon: PJalene Mullet MD;  [place]  Service:  Ophthalmology;  Laterality: Right;   PHOTOCOAGULATION WITH LASER Right 08/16/2020   Procedure: PHOTOCOAGULATION WITH LASER;  Surgeon: Jalene Mullet, MD;  Location: [place];  Service: Ophthalmology;  Laterality: Right;   PHOTOCOAGULATION WITH LASER Right 09/29/2020   Procedure: PHOTOCOAGULATION WITH LASER;  Surgeon: Jalene Mullet, MD;  Location: [place];  Service: Ophthalmology;  Laterality: Right;    SHOULDER SURGERY     TONSILLECTOMY     TUBAL LIGATION      Allergies  No Known Allergies  EKGs/Labs/Other Studies Reviewed:   The following studies were reviewed today:   2D echo 01/17/22   1. Left ventricular ejection fraction, by estimation, is 60 to 65%. The  left ventricle has normal function. The left ventricle demonstrates  regional wall motion abnormalities (see scoring diagram/findings for  description). There is mild left ventricular   hypertrophy. Left ventricular diastolic parameters were normal. There is  mild hypokinesis of the left ventricular, basal inferolateral wall.   2. Right ventricular systolic function is normal. The right ventricular  size is normal.   3. Left atrial size was moderately dilated.   4. The mitral valve is normal in structure. Trivial mitral valve  regurgitation. No evidence of mitral stenosis. Moderate mitral annular  calcification.   5. The aortic valve is normal in structure. There is mild calcification  of the aortic valve. Aortic valve regurgitation is trivial. Aortic valve  sclerosis is present, with no evidence of aortic valve stenosis.   6. The inferior vena cava is normal in size with <50% respiratory  variability, suggesting right atrial pressure of 8 mmHg.   7. Cannot exclude a small PFO.   Comparison(s): No significant change from prior study. Prior images  reviewed side by side. Mild hypokinesis of basal inferolateral wall seen  on current and prior study, no visual change between studies.    Cor CT - see EMR for report    EKG:  EKG is not ordered today.    Recent Labs: 09/28/2021: BNP 20.1; Magnesium 2.2 03/02/2022: Platelets 313 04/29/2022: ALT 7; BUN 15; Creatinine, Ser 0.62; Hemoglobin 11.0; Potassium 4.5; Sodium 142; TSH 0.131  Recent Lipid Panel    Component Value Date/Time   CHOL 133 01/17/2022 0234   CHOL 160 06/29/2021 1044   TRIG 65 01/17/2022 0234   HDL 48 01/17/2022 0234   HDL 69 06/29/2021 1044   CHOLHDL  2.8 01/17/2022 0234   VLDL 13 01/17/2022 0234   LDLCALC 72 01/17/2022 0234   LDLCALC 79 06/29/2021 1044    Risk Assessment/Calculations:   CHA2DS2-VASc Score = 3   This indicates a 3.2% annual risk of stroke. The patient's score is based upon: CHF History: 0 HTN History: 1 Diabetes History: 0 Stroke History: 0 Vascular Disease History: 0 Age Score: 1 Gender Score: 1     Review of Systems      All other systems reviewed and are otherwise negative except as noted above.  Physical Exam    VS:  BP 108/68   Pulse 71   Ht _0  (1.549 m)   Wt 156 lb (70.8 kg)   SpO2 97%   BMI 29.48 kg/m  , BMI Body mass index is 29.48 kg/m.  Wt Readings from Last 3 Encounters:  05/17/22 156 lb (70.8 kg)  04/29/22 158 lb (71.7 kg)  04/22/22 150 lb (68 kg)     GEN: Well nourished, well developed, in no acute distress. HEENT: normal. Neck: Supple, no JVD, + right carotid bruit, or masses. Cardiac:  RRR, no murmurs, rubs, or gallops. No clubbing, cyanosis, edema.  Radials/PT 2+ and equal bilaterally.  Respiratory:  Respirations regular and unlabored, clear to auscultation bilaterally. GI: Soft, nontender, nondistended. MS: No deformity or atrophy. Skin: Warm and dry, no rash. Neuro:  Strength and sensation are intact. Psych: Normal affect.  Assessment & Plan    Paroxysmal atrial fibrillation -Continue medical treatment with Eliquis 5 mg twice daily and metoprolol tartrate 25 mg twice daily -Asymptomatic at this time  Mild CAD -Continue GDMT with Eliquis 5 mg twice daily, metoprolol tartrate 25 mg twice daily, Crestor 10 mg daily -no chest pain but does endorse "panting"  Recent PE -continue anticoagulation  Anemia -she is on iron supplementation -will leave to PCP to continue surveillance  Possible PFO -treated with anticoagulation, no closure needed at this time  Lower extremity edema -Right-sided lower extremity edema is chronic for her  Right carotid bruit -Recent  carotid ultrasound 02/2022 showed mild carotid plaque with some disturbed flow in the right subclavian artery which is a likely culprit of her bruit.         Disposition: Follow up 6 months with Fransico Him, MD or APP.  Signed, Elgie Collard, PA-C 05/17/2022, 4:47 PM [facility]

## 2022-05-18 ENCOUNTER — Telehealth: Payer: Self-pay

## 2022-05-18 DIAGNOSIS — I4891 Unspecified atrial fibrillation: Secondary | ICD-10-CM | POA: Diagnosis not present

## 2022-05-18 DIAGNOSIS — Z9181 History of falling: Secondary | ICD-10-CM | POA: Diagnosis not present

## 2022-05-18 DIAGNOSIS — E785 Hyperlipidemia, unspecified: Secondary | ICD-10-CM | POA: Diagnosis not present

## 2022-05-18 DIAGNOSIS — M545 Low back pain, unspecified: Secondary | ICD-10-CM | POA: Diagnosis not present

## 2022-05-18 DIAGNOSIS — E039 Hypothyroidism, unspecified: Secondary | ICD-10-CM | POA: Diagnosis not present

## 2022-05-18 DIAGNOSIS — I2699 Other pulmonary embolism without acute cor pulmonale: Secondary | ICD-10-CM | POA: Diagnosis not present

## 2022-05-18 DIAGNOSIS — G20 Parkinson's disease: Secondary | ICD-10-CM | POA: Diagnosis not present

## 2022-05-18 DIAGNOSIS — I214 Non-ST elevation (NSTEMI) myocardial infarction: Secondary | ICD-10-CM | POA: Diagnosis not present

## 2022-05-18 DIAGNOSIS — Z1612 Extended spectrum beta lactamase (ESBL) resistance: Secondary | ICD-10-CM | POA: Diagnosis not present

## 2022-05-18 DIAGNOSIS — I872 Venous insufficiency (chronic) (peripheral): Secondary | ICD-10-CM | POA: Diagnosis not present

## 2022-05-18 DIAGNOSIS — D509 Iron deficiency anemia, unspecified: Secondary | ICD-10-CM | POA: Diagnosis not present

## 2022-05-18 DIAGNOSIS — I10 Essential (primary) hypertension: Secondary | ICD-10-CM | POA: Diagnosis not present

## 2022-05-18 DIAGNOSIS — E119 Type 2 diabetes mellitus without complications: Secondary | ICD-10-CM | POA: Diagnosis not present

## 2022-05-18 DIAGNOSIS — Z7901 Long term (current) use of anticoagulants: Secondary | ICD-10-CM | POA: Diagnosis not present

## 2022-05-18 DIAGNOSIS — N390 Urinary tract infection, site not specified: Secondary | ICD-10-CM | POA: Diagnosis not present

## 2022-05-18 DIAGNOSIS — G8929 Other chronic pain: Secondary | ICD-10-CM | POA: Diagnosis not present

## 2022-05-18 NOTE — Telephone Encounter (Signed)
Received phone call from Jae Dire, PT with [facility] to report patient fall.   Fall occurred yesterday prior to PT visit. Patient fell on knees. She had full range of motion after fall and is able to bear weight appropriately. No head injury or LOC.   Patient will call our office if she needs further follow up for this concern.   FYI to PCP.   Veronda Prude, RN

## 2022-05-19 ENCOUNTER — Encounter: Payer: Self-pay | Admitting: Family Medicine

## 2022-05-19 ENCOUNTER — Ambulatory Visit: Payer: Medicare Other | Admitting: Family Medicine

## 2022-05-19 VITALS — BP 156/82 | HR 80 | Ht 61.0 in

## 2022-05-19 DIAGNOSIS — K219 Gastro-esophageal reflux disease without esophagitis: Secondary | ICD-10-CM

## 2022-05-19 DIAGNOSIS — R351 Nocturia: Secondary | ICD-10-CM | POA: Diagnosis not present

## 2022-05-19 DIAGNOSIS — Z8619 Personal history of other infectious and parasitic diseases: Secondary | ICD-10-CM

## 2022-05-19 DIAGNOSIS — Z1231 Encounter for screening mammogram for malignant neoplasm of breast: Secondary | ICD-10-CM

## 2022-05-19 MED ORDER — FAMOTIDINE 20 MG PO TABS: 20.0000 mg | ORAL_TABLET | Freq: Every day | ORAL | 2 refills | Status: DC

## 2022-05-19 NOTE — Assessment & Plan Note (Signed)
Still with increased frequency and nocturia but no other signs of infection, will get urine TOC today

## 2022-05-19 NOTE — Assessment & Plan Note (Addendum)
Trial of PRN pepcid, however since worse in the last few months with some associated weight loss, discussed referral to GI for potential EGD to rule out malignancy, EGD in 2019 was without stomach abnormality or lesion, referral placed today

## 2022-05-19 NOTE — Patient Instructions (Addendum)
It was wonderful to see you today.  Please bring ALL of your medications with you to every visit.   Today we talked about:  We will retest your urine today and we have sent a referral to urology  You can take pepcid one to two times per day for your heartburn.  Gave you a form to call today for mammogram  Please make a follow up in 2 months.  Thank you for choosing [facility].   Please call [phone] with any questions about today's appointment.  Please be sure to schedule follow up at the front  desk before you leave today.   Please arrive at least 15 minutes prior to your scheduled appointments.   If you had blood work today, I will send you a MyChart message or a letter if results are normal. Otherwise, I will give you a call.   If you had a referral placed, they will call you to set up an appointment. Please give [place] a call if you don't hear back in the next 2 weeks.   If you need additional refills before your next appointment, please call your pharmacy first.   Burley Saver, MD  Family Medicine

## 2022-05-19 NOTE — Assessment & Plan Note (Signed)
Shared decisino making as she is > 71 yo, she elects for screening mammogram, sheet given to call to schedule

## 2022-05-19 NOTE — Assessment & Plan Note (Signed)
Referral to urology as she has had frequent infection, [facility] in 2019 but no cystography performed

## 2022-05-21 LAB — URINE CULTURE

## 2022-05-22 ENCOUNTER — Telehealth: Payer: Self-pay | Admitting: Family Medicine

## 2022-05-22 NOTE — Telephone Encounter (Signed)
Called patient to discuss + ESBL urine culture in the setting of her continued urinary frequency.  Has had course of fosfomycin in hospital and macrobid outpatient. Discussed with pharmacy who agreed Augmentin unlikely to be sensitive for ESBL and need for IV antibiotics. As it is holiday weekend  Patient did not pick up , left VM explaining need to go to hospital for IV antibiotics, instructed patient with clinic number to call after hours line for details on direct admission vs going to ED.  If patient calls back, would discuss need for IV antibotics for ESBL UTI and options of going to [facility] ED versus direct admission. Also left on VM that if she develops fevers, chills, flank pain, malaise needs to go to hospital as she may be developing sepsis.  Will try again later. Burley Saver MD

## 2022-05-24 ENCOUNTER — Encounter (HOSPITAL_COMMUNITY): Payer: Self-pay

## 2022-05-24 ENCOUNTER — Telehealth: Payer: Self-pay | Admitting: Family Medicine

## 2022-05-24 ENCOUNTER — Inpatient Hospital Stay (HOSPITAL_COMMUNITY)
Admission: AD | Admit: 2022-05-24 | Discharge: 2022-05-27 | DRG: 690 | Disposition: A | Discharge status: Home Health | Payer: Medicare Other | Attending: Internal Medicine | Admitting: Internal Medicine

## 2022-05-24 DIAGNOSIS — I480 Paroxysmal atrial fibrillation: Secondary | ICD-10-CM | POA: Diagnosis present

## 2022-05-24 DIAGNOSIS — K5900 Constipation, unspecified: Secondary | ICD-10-CM | POA: Diagnosis not present

## 2022-05-24 DIAGNOSIS — M2041 Other hammer toe(s) (acquired), right foot: Secondary | ICD-10-CM | POA: Diagnosis not present

## 2022-05-24 DIAGNOSIS — Z79890 Hormone replacement therapy (postmenopausal): Secondary | ICD-10-CM

## 2022-05-24 DIAGNOSIS — I10 Essential (primary) hypertension: Secondary | ICD-10-CM | POA: Diagnosis present

## 2022-05-24 DIAGNOSIS — E039 Hypothyroidism, unspecified: Secondary | ICD-10-CM | POA: Diagnosis not present

## 2022-05-24 DIAGNOSIS — B9629 Other Escherichia coli [E. coli] as the cause of diseases classified elsewhere: Secondary | ICD-10-CM

## 2022-05-24 DIAGNOSIS — N390 Urinary tract infection, site not specified: Principal | ICD-10-CM | POA: Diagnosis present

## 2022-05-24 DIAGNOSIS — R351 Nocturia: Secondary | ICD-10-CM | POA: Diagnosis present

## 2022-05-24 DIAGNOSIS — E119 Type 2 diabetes mellitus without complications: Secondary | ICD-10-CM | POA: Diagnosis present

## 2022-05-24 DIAGNOSIS — M545 Low back pain, unspecified: Secondary | ICD-10-CM

## 2022-05-24 DIAGNOSIS — Z7901 Long term (current) use of anticoagulants: Secondary | ICD-10-CM

## 2022-05-24 DIAGNOSIS — R011 Cardiac murmur, unspecified: Secondary | ICD-10-CM | POA: Diagnosis present

## 2022-05-24 DIAGNOSIS — E7800 Pure hypercholesterolemia, unspecified: Secondary | ICD-10-CM | POA: Diagnosis not present

## 2022-05-24 DIAGNOSIS — G20 Parkinson's disease: Secondary | ICD-10-CM | POA: Diagnosis present

## 2022-05-24 DIAGNOSIS — A499 Bacterial infection, unspecified: Secondary | ICD-10-CM

## 2022-05-24 DIAGNOSIS — Z1612 Extended spectrum beta lactamase (ESBL) resistance: Secondary | ICD-10-CM | POA: Diagnosis not present

## 2022-05-24 DIAGNOSIS — Z79899 Other long term (current) drug therapy: Secondary | ICD-10-CM

## 2022-05-24 DIAGNOSIS — B9620 Unspecified Escherichia coli [E. coli] as the cause of diseases classified elsewhere: Secondary | ICD-10-CM | POA: Diagnosis present

## 2022-05-24 LAB — COMPREHENSIVE METABOLIC PANEL
ALT: 5 U/L (ref 0–44)
AST: 18 U/L (ref 15–41)
Albumin: 3.3 g/dL — ABNORMAL LOW (ref 3.5–5.0)
Alkaline Phosphatase: 54 U/L (ref 38–126)
Anion gap: 8 (ref 5–15)
BUN: 17 mg/dL (ref 8–23)
CO2: 28 mmol/L (ref 22–32)
Calcium: 8.9 mg/dL (ref 8.9–10.3)
Chloride: 104 mmol/L (ref 98–111)
Creatinine, Ser: 0.66 mg/dL (ref 0.44–1.00)
GFR, Estimated: >60 mL/min (ref >60)
Glucose, Bld: 77 mg/dL (ref 70–99)
Potassium: 3.7 mmol/L (ref 3.5–5.1)
Sodium: 140 mmol/L (ref 135–145)
Total Bilirubin: 0.7 mg/dL (ref 0.3–1.2)
Total Protein: 6.5 g/dL (ref 6.5–8.1)

## 2022-05-24 LAB — CBC WITH DIFFERENTIAL/PLATELET
Abs Immature Granulocytes: 0.02 10*3/uL (ref 0.00–0.07)
Basophils Absolute: 0 10*3/uL (ref 0.0–0.1)
Basophils Relative: 1 %
Eosinophils Absolute: 0.1 10*3/uL (ref 0.0–0.5)
Eosinophils Relative: 2 %
HCT: 28.8 % — ABNORMAL LOW (ref 36.0–46.0)
Hemoglobin: 9.8 g/dL — ABNORMAL LOW (ref 12.0–15.0)
Immature Granulocytes: 0 %
Lymphocytes Relative: 31 %
Lymphs Abs: 1.9 10*3/uL (ref 0.7–4.0)
MCH: 25.3 pg — ABNORMAL LOW (ref 26.0–34.0)
MCHC: 34 g/dL (ref 30.0–36.0)
MCV: 74.2 fL — ABNORMAL LOW (ref 80.0–100.0)
Monocytes Absolute: 0.4 10*3/uL (ref 0.1–1.0)
Monocytes Relative: 7 %
Neutro Abs: 3.6 10*3/uL (ref 1.7–7.7)
Neutrophils Relative %: 59 %
Platelets: 295 10*3/uL (ref 150–400)
RBC: 3.88 MIL/uL (ref 3.87–5.11)
RDW: 14.1 % (ref 11.5–15.5)
WBC: 6 10*3/uL (ref 4.0–10.5)
nRBC: 0 % (ref 0.0–0.2)

## 2022-05-24 LAB — HIV ANTIBODY (ROUTINE TESTING W REFLEX): HIV Screen 4th Generation wRfx: NONREACTIVE

## 2022-05-24 MED ORDER — METOPROLOL TARTRATE 25 MG PO TABS
25.0000 mg | ORAL_TABLET | Freq: Two times a day (BID) | ORAL | Status: DC
Start: 2022-05-25 — End: 2022-05-25
  Filled 2022-05-24: qty 1

## 2022-05-24 MED ORDER — CARBIDOPA-LEVODOPA ER 25-100 MG PO TBCR
2.0000 | EXTENDED_RELEASE_TABLET | Freq: Two times a day (BID) | ORAL | Status: DC
  Administered 2022-05-25 – 2022-05-27 (×6): 2 via ORAL
  Filled 2022-05-24 (×6): qty 2

## 2022-05-24 MED ORDER — CARBIDOPA-LEVODOPA ER 25-100 MG PO TBCR
3.0000 | EXTENDED_RELEASE_TABLET | Freq: Every day | ORAL | Status: DC
  Administered 2022-05-25 – 2022-05-27 (×3): 3 via ORAL
  Filled 2022-05-24 (×6): qty 3

## 2022-05-24 MED ORDER — PHENOL 1.4 % MT LIQD: 1.0000 | OROMUCOSAL | Status: DC | PRN

## 2022-05-24 MED ORDER — APIXABAN 5 MG PO TABS
5.0000 mg | ORAL_TABLET | Freq: Two times a day (BID) | ORAL | Status: DC
  Administered 2022-05-24 – 2022-05-27 (×6): 5 mg via ORAL
  Filled 2022-05-24 (×6): qty 1

## 2022-05-24 MED ORDER — SODIUM CHLORIDE 0.9 % IV SOLN
1.0000 g | Freq: Three times a day (TID) | INTRAVENOUS | Status: DC
  Administered 2022-05-24 – 2022-05-27 (×9): 1 g via INTRAVENOUS
  Filled 2022-05-24 (×10): qty 20

## 2022-05-24 MED ORDER — MELATONIN 3 MG PO TABS
3.0000 mg | ORAL_TABLET | Freq: Every day | ORAL | Status: DC
  Administered 2022-05-24 – 2022-05-26 (×3): 3 mg via ORAL
  Filled 2022-05-24 (×3): qty 1

## 2022-05-24 MED ORDER — FAMOTIDINE 20 MG PO TABS
20.0000 mg | ORAL_TABLET | Freq: Every day | ORAL | Status: DC
  Administered 2022-05-25 – 2022-05-27 (×3): 20 mg via ORAL
  Filled 2022-05-24 (×3): qty 1

## 2022-05-24 MED ORDER — POLYETHYLENE GLYCOL 3350 17 G PO PACK
17.0000 g | PACK | Freq: Once | ORAL | Status: AC
Start: 2022-05-24 — End: 2022-05-24
  Administered 2022-05-24: 17 g via ORAL
  Filled 2022-05-24: qty 1

## 2022-05-24 MED ORDER — LEVOTHYROXINE SODIUM 100 MCG PO TABS
100.0000 ug | ORAL_TABLET | ORAL | Status: DC
  Administered 2022-05-25 – 2022-05-27 (×3): 100 ug via ORAL
  Filled 2022-05-24 (×3): qty 1

## 2022-05-24 MED ORDER — CARBIDOPA-LEVODOPA ER 25-100 MG PO TBCR
1.0000 | EXTENDED_RELEASE_TABLET | Freq: Two times a day (BID) | ORAL | Status: DC
Start: 2022-05-25 — End: 2022-05-24

## 2022-05-24 MED ORDER — IRBESARTAN 75 MG PO TABS
150.0000 mg | ORAL_TABLET | Freq: Every day | ORAL | Status: DC
  Administered 2022-05-25 – 2022-05-27 (×3): 150 mg via ORAL
  Filled 2022-05-24 (×3): qty 2

## 2022-05-24 MED ORDER — ROSUVASTATIN CALCIUM 5 MG PO TABS
10.0000 mg | ORAL_TABLET | Freq: Every day | ORAL | Status: DC
  Administered 2022-05-25 – 2022-05-27 (×3): 10 mg via ORAL
  Filled 2022-05-24 (×3): qty 2

## 2022-05-24 MED ORDER — LIP MEDEX EX OINT: 1.0000 | TOPICAL_OINTMENT | CUTANEOUS | Status: DC | PRN

## 2022-05-24 MED ORDER — SALINE SPRAY 0.65 % NA SOLN: 1.0000 | NASAL | Status: DC | PRN

## 2022-05-24 MED ORDER — HYDROCORTISONE (PERIANAL) 2.5 % EX CREA: 1.0000 | TOPICAL_CREAM | Freq: Four times a day (QID) | CUTANEOUS | Status: DC | PRN

## 2022-05-24 NOTE — Assessment & Plan Note (Deleted)
-   Continue home medication Sinemet 

## 2022-05-24 NOTE — Hospital Course (Addendum)
Anna Bernard is a 71 yo female presents for culture confirmed ESBL UTI.  ESBL UTI: Patient directly admitted by PCP due to need for IV antibiotic therapy to manage her recurrent ESBL UTI. Started on meropenem and monitored for pyelo sx. Patient did not have any fevers or any further symptoms. She was able to transition to Fosfomycin after 3 days of treatment with Meropenem. Patient to finish antibiotic course with a repeat dose of Fosfomycin on 9/11.  HTN: Blood pressure is mildly elevated on admission.  Patient's home olmesartan is nonformulary was substituted with equivalent irbesartan dosing.  Blood pressures largely well controlled during hospitalization.  Medication assistance Patient and family appear to have some unclear understanding of what medications that she was being treated with and what they were for.  Patient also appreciated the schedule that the RN was dosing her medications, a schedule was given that resembled the administration of her medications in the hospital.  Issues for follow-up Ensure completed Fosfomycin dose on 9/11 Discuss with family any concerns regarding medications and consider using pill schedule or pillbox if family has not already using them. Needs appt with [facility]  Needs assistance with hammertoe deformity as the main limitation to her mobility. Has appt with podiatry on 06/02/22 with Dr. Annamary Rummage at [facility] in [place]. Also discharged with home health PT.

## 2022-05-24 NOTE — Assessment & Plan Note (Deleted)
Patient in normal rhythm. Start on home med - Eliquis for anticoagulation.

## 2022-05-24 NOTE — Progress Notes (Signed)
FMTS Brief Progress Note  S: Patient denies acute pain. Reports feeling better after getting nursing help with urination. Reports constipation and no BM for the past 3 days.   O: BP (!) 109/51 (BP Location: Left Arm)   Pulse 82   Temp 98.6 F (37 C) (Oral)   Resp 18   Ht 5\' 2"  (1.575 m)   Wt 70.1 kg   SpO2 98%   BMI 28.27 kg/m   GEN: well-appearing African American female wearing glasses in no acute distress. Sitting up in bed.  CARDIO: RRR. No m/r/g.  RESP: CTAB.  ABD: Soft, non-tender to palpation in all 4 quadrants. Non-distended.  NEURO: alert and oriented to self and place.  PSYCH: flat affect  A/P: UTI due to EBSL E. Coli  -continue on IV meropenem   Constipation  -miralax x1    , MD 05/24/2022, 9:43 PM PGY-1, [facility] Night Resident  Please page [phone] with questions.

## 2022-05-24 NOTE — Progress Notes (Signed)
Pharmacy Antibiotic Note  Anna Bernard is a 71 y.o. female admitted on 05/24/2022 with UTI.  Pharmacy has been consulted for meropenem dosing.  Pt with ESBL symptomatic UTI who was admitted today for treatment. We will start Merrem for now.   Scr pending  Plan: Merrem 1g IV q8  Height: 5\' 2"  (157.5 cm) Weight: 70.1 kg (154 lb 8.7 oz) IBW/kg (Calculated) : 50.1  Temp (24hrs), Avg:98.2 F (36.8 C), Min:98.2 F (36.8 C), Max:98.2 F (36.8 C)  No results for input(s): "WBC", "CREATININE", "LATICACIDVEN", "VANCOTROUGH", "VANCOPEAK", "VANCORANDOM", "GENTTROUGH", "GENTPEAK", "GENTRANDOM", "TOBRATROUGH", "TOBRAPEAK", "TOBRARND", "AMIKACINPEAK", "AMIKACINTROU", "AMIKACIN" in the last 168 hours.  CrCl cannot be calculated (Patient's most recent lab result is older than the maximum 21 days allowed.).    No Known Allergies  Antimicrobials this admission: 9/5 meropenem>>  Dose adjustments this admission:   Microbiology results: 8/11 urine>>ESBL e.coli (S- Augmentin, erta, merrem, tetracycline, nitrofurantoin) 8/31 urine>>ESBL e.coli (S-augmentin, erta, merrem, nitrofurantoin, zosyn)  9/31, PharmD, [place], AAHIVP, CPP Infectious Disease Pharmacist 05/24/2022 5:19 PM

## 2022-05-24 NOTE — Assessment & Plan Note (Addendum)
Patient with urinary frequency, nocturia and foul urine odor. WBC 6.0. Urine culture confirmed ESBL E. coli UTI, admitted for IV abx.  - Monitor closely for pyelo sx - Completed Meropenem IV (Day until lunchtime today, send her out with fosfomycin PO dose and then another in 3 days (8/11)

## 2022-05-24 NOTE — Assessment & Plan Note (Addendum)
BP 158/77 on admission.Currently stable. - Monitor with routine vitals - Irbesartan 150mg  daily

## 2022-05-24 NOTE — Plan of Care (Signed)

## 2022-05-24 NOTE — Progress Notes (Signed)
I have evaluated this patient and agreed with admission for IV A/B for ESBL UTI. I will cosign resident's H&P. Patient signed off to the night attending - Dr. Tivis Ringer.

## 2022-05-24 NOTE — Assessment & Plan Note (Deleted)
Continue on home med - rosuvastatin 10mg .

## 2022-05-24 NOTE — Telephone Encounter (Signed)
Called and spoke with Anna Bernard and her husband. She continues to have urinary frequency, denies dysuria, hematuria, flank pain, fevers or chills. I discussed her urine is growing ESBL and I had discussed with a pharmacist inpatient who agrees there are not other oral options to treat it and it likely needs IV antibiotics. They are agreeable to hospital admission. As she is not showing signs of sepsis or pyelonephritis, I discussed direct admission and waiting at home to be called versus presenting to the ED. They are electing for direct admission. Discussed strict return precautions, including fevers, chills, flank pain, malaise, vomiting - if she has these she is to go straight to ED.  Called bed placement at [facility]- plan to admit to observation to FMTS. Admitting dx: ESBL UTI. Needs labwork, IV antibiotics, possible ID consult to arrange for outpatient IV antibiotics depending on duration of treatment.  Alerted FMTS attending and resident team.  Burley Saver MD

## 2022-05-24 NOTE — Assessment & Plan Note (Deleted)
Patient with documented hypothyroidism. Continue home med - Synthroid .

## 2022-05-24 NOTE — H&P (Addendum)
Hospital Admission History and Physical Service Pager: [phone]  Patient name: Anna Bernard Medical record number: [id number] Date of Birth: 1950-12-10 Age: 71 y.o. Gender: female  Primary Care Provider: Billey Co, MD Consultants: None Code Status: Full code Preferred Emergency Contact:  Contact Information     Name Relation Home Work [place] [phone]  [phone]   Rico Junker Daughter   [phone]       Chief Complaint: Urinary frequency  Assessment and Plan: Anna Bernard is a 71 y.o. female presenting with culture confirmed ESBL UTI.   * UTI due to extended-spectrum beta lactamase (ESBL) producing Escherichia coli Patient with urinary frequency, nocturia and foul urine odor. Culture confirmed ESBL UTI, patient out of options for oral antibiotics and admitted for IV abx. - Admitted to FMTS service, Dr. Lum Babe attending - Meropenem per pharmacy - Monitor closely for pyelo sx - Vitals per routine - CBC and CMP tonight - AM labs ordered - Consider ID consult, pending additional Abx needs on discharge  Essential hypertension BP 158/77 on admission. Home olmesartan not on formulary and will substitute with Irbesartan. - Monitor with routine vitals - Irbesartan 150mg  daily   Chronic problem with home medications: Paroxysmal A-fib: Eliquis 5mg  BID Hypothyroidism: Synthroid Parkinson disease: Sinemet 25-100mg  Hypercholesterolemia: Rosuvastatin 10mg    FEN/GI: Normal diet VTE Prophylaxis: Eliquis  Disposition: admitted to FMTS for IV Abx and obs  History of Present Illness:  Anna Bernard is a 71 y.o. female presenting with urinary frequency, nocturia and foul urine odor x months. Patient has hx of ESBL UTI in the past requiring hospitalization for fosfomycin treatment. Admits to some urgency but wears adult briefs and indicates "it can be hard to tell". Denies dysuria, urinary incontinence, hematuria, flank  pain, fevers or chills.  Patient admitted directly to the hospital by PCP.  Review Of Systems: as above  Pertinent Past Medical History: Hx of ESBL infection Paroxysmal A-fib Hypothyroidism HTN Anemia Parkinson's disease Remainder reviewed in history tab.   Pertinent Past Surgical History: Abdominal hysterectomy Remainder reviewed in history tab.   Pertinent Social History: Tobacco use: Former, quit 30+ years ago Alcohol use: None Other Substance use: None Lives with husband  Pertinent Family History: None Remainder reviewed in history tab.   Important Outpatient Medications: Eliquis 5 BID Sinemet 25-158mcg (3 tabs at 7am, 2 tabs at 11am, 2 tabs at 4pm) Synthroid Anna Bernard daily Metoprolol 25mg  BID Olmesartan 20mg  daily Rosuvastatin 10mg  daily Remainder reviewed in medication history.   Objective: BP (!) 158/77 (BP Location: Left Arm)   Pulse 75   Temp 98.2 F (36.8 C)   Resp 16   Ht 5\' 2"  (1.575 m)   Wt 70.1 kg   SpO2 100%   BMI 28.27 kg/m  Exam: General: well appearing, resting comfortably Eyes: PERRLA. Anicteric ENTM: No rhinorrhea Neck: Supple, non-tender Cardiovascular: RRR without murmur, rub or gallop Respiratory: CTAB, normal work of breathing on room air Gastrointestinal: soft, non-tender, non-distended MSK: no peripheral edema Derm: warm, dry Neuro: Motor and sensation intact globally. Psych: pleasant, cooperative  Labs:  Urine culture showing:  Escherichia coli, identified by an automated biochemical system.  Multi-Drug Resistant Organism  Susceptibility profile is consistent with a probable ESBL.  Greater than 100,000 colony forming units per mL    80, MD 05/24/2022, 5:43 PM PGY-1, [facility] Family Medicine  FPTS Intern pager: [phone], text pages welcome Secure chat group [facility] Teaching Service  FPTS Upper-Level Resident Addendum   I have independently interviewed and examined the  patient. I have discussed the above with the original author and agree with their documentation. My edits for correction/addition/clarification are in within the document. Please see also any attending notes.   Evelena Leyden, DO  PGY-3, [facility] 05/24/2022 5:55 PM  FPTS Service pager: [phone] (text pages welcome through [facility])

## 2022-05-25 DIAGNOSIS — Z1612 Extended spectrum beta lactamase (ESBL) resistance: Secondary | ICD-10-CM | POA: Diagnosis not present

## 2022-05-25 DIAGNOSIS — E7800 Pure hypercholesterolemia, unspecified: Secondary | ICD-10-CM | POA: Diagnosis present

## 2022-05-25 DIAGNOSIS — I480 Paroxysmal atrial fibrillation: Secondary | ICD-10-CM | POA: Diagnosis present

## 2022-05-25 DIAGNOSIS — E119 Type 2 diabetes mellitus without complications: Secondary | ICD-10-CM | POA: Diagnosis present

## 2022-05-25 DIAGNOSIS — Z79899 Other long term (current) drug therapy: Secondary | ICD-10-CM | POA: Diagnosis not present

## 2022-05-25 DIAGNOSIS — R011 Cardiac murmur, unspecified: Secondary | ICD-10-CM

## 2022-05-25 DIAGNOSIS — G20 Parkinson's disease: Secondary | ICD-10-CM | POA: Diagnosis present

## 2022-05-25 DIAGNOSIS — E039 Hypothyroidism, unspecified: Secondary | ICD-10-CM | POA: Diagnosis present

## 2022-05-25 DIAGNOSIS — B9629 Other Escherichia coli [E. coli] as the cause of diseases classified elsewhere: Secondary | ICD-10-CM | POA: Diagnosis not present

## 2022-05-25 DIAGNOSIS — I10 Essential (primary) hypertension: Secondary | ICD-10-CM | POA: Diagnosis present

## 2022-05-25 DIAGNOSIS — R351 Nocturia: Secondary | ICD-10-CM | POA: Diagnosis present

## 2022-05-25 DIAGNOSIS — Z7901 Long term (current) use of anticoagulants: Secondary | ICD-10-CM | POA: Diagnosis not present

## 2022-05-25 DIAGNOSIS — B9620 Unspecified Escherichia coli [E. coli] as the cause of diseases classified elsewhere: Secondary | ICD-10-CM | POA: Diagnosis present

## 2022-05-25 DIAGNOSIS — K5900 Constipation, unspecified: Secondary | ICD-10-CM | POA: Diagnosis present

## 2022-05-25 DIAGNOSIS — N390 Urinary tract infection, site not specified: Secondary | ICD-10-CM | POA: Diagnosis present

## 2022-05-25 DIAGNOSIS — M2041 Other hammer toe(s) (acquired), right foot: Secondary | ICD-10-CM | POA: Diagnosis present

## 2022-05-25 DIAGNOSIS — Z79890 Hormone replacement therapy (postmenopausal): Secondary | ICD-10-CM | POA: Diagnosis not present

## 2022-05-25 LAB — CBC
HCT: 28.8 % — ABNORMAL LOW (ref 36.0–46.0)
Hemoglobin: 10 g/dL — ABNORMAL LOW (ref 12.0–15.0)
MCH: 25.8 pg — ABNORMAL LOW (ref 26.0–34.0)
MCHC: 34.7 g/dL (ref 30.0–36.0)
MCV: 74.2 fL — ABNORMAL LOW (ref 80.0–100.0)
Platelets: 296 10*3/uL (ref 150–400)
RBC: 3.88 MIL/uL (ref 3.87–5.11)
RDW: 14.2 % (ref 11.5–15.5)
WBC: 7.5 10*3/uL (ref 4.0–10.5)
nRBC: 0 % (ref 0.0–0.2)

## 2022-05-25 LAB — BASIC METABOLIC PANEL
Anion gap: 7 (ref 5–15)
BUN: 16 mg/dL (ref 8–23)
CO2: 28 mmol/L (ref 22–32)
Calcium: 8.7 mg/dL — ABNORMAL LOW (ref 8.9–10.3)
Chloride: 106 mmol/L (ref 98–111)
Creatinine, Ser: 0.65 mg/dL (ref 0.44–1.00)
GFR, Estimated: >60 mL/min (ref >60)
Glucose, Bld: 121 mg/dL — ABNORMAL HIGH (ref 70–99)
Potassium: 3.8 mmol/L (ref 3.5–5.1)
Sodium: 141 mmol/L (ref 135–145)

## 2022-05-25 MED ORDER — METOPROLOL TARTRATE 25 MG PO TABS
25.0000 mg | ORAL_TABLET | Freq: Two times a day (BID) | ORAL | Status: DC
  Administered 2022-05-25 – 2022-05-26 (×3): 25 mg via ORAL
  Filled 2022-05-25 (×2): qty 1

## 2022-05-25 MED ORDER — ACETAMINOPHEN 325 MG PO TABS
650.0000 mg | ORAL_TABLET | Freq: Four times a day (QID) | ORAL | Status: DC | PRN
Start: 2022-05-25 — End: 2022-05-27
  Administered 2022-05-26 (×2): 650 mg via ORAL
  Filled 2022-05-25 (×2): qty 2

## 2022-05-25 NOTE — TOC Initial Note (Signed)
Transition of Care [facility]) - Initial/Assessment Note    Patient Details  Name: Anna Bernard MRN: [id number] Date of Birth: February 17, 1951  Transition of Care [facility]) CM/SW Contact:    Anna Bernard, Anna Coria, RN Phone Number: 05/25/2022, 12:47 PM  Clinical Narrative:                  CM spoke with patient and husband, Anna Bernard at bedside about needs for post hospital transition.  Patient is admitted for Symptomatic recurrent UTI 2/2 ESBL. On IV abx. Has significant hx of Paroxysmal A-fib, on Eliquis, Hypothyroidism, Parkinson disease, DM type 2, Hypercholesterolemia, Anemia.  Has one supportive daughter. Has cane, walker, shower chair and grab bars at home.  PCP is Pray, Milus Mallick, MD and [facility] [address]. Patient and husband requesting PT to eval for a chronic Rt leg pain with swelling. MD notified. Patient states she has a scheduled appointment on 09/14 with Anna [facility] at [facility] [place]. CM will continue to follow as patient progresses with care.    Barriers to Discharge: Continued Medical Work up   Patient Goals and CMS Choice Patient states their goals for this hospitalization and ongoing recovery are:: To return home CMS Medicare.gov Compare Post Acute Care list provided to:: Patient Choice offered to / list presented to : Patient, Spouse  Expected Discharge Plan and Services     Discharge Planning Services: CM Consult   Living arrangements for the past 2 months: Single Family Home                                      Prior Living Arrangements/Services Living arrangements for the past 2 months: Single Family Home Lives with:: Spouse Patient language and need for interpreter reviewed:: Yes Do you feel safe going back to the place where you live?: Yes      Need for Family Participation in Patient Care: Yes (Comment) Care giver support system in place?: Yes (comment) Current home services: DME (Cane, walker,  shower chari and grab bars.) Criminal Activity/Legal Involvement Pertinent to Current Situation/Hospitalization: No - Comment as needed  Activities of Daily Living Home Assistive Devices/Equipment: Eyeglasses, Wheelchair, Environmental consultant (specify type) (roller walker) ADL Screening (condition at time of admission) Patient's cognitive ability adequate to safely complete daily activities?: Yes Is the patient deaf or have difficulty hearing?: No Does the patient have difficulty seeing, even when wearing glasses/contacts?: No Does the patient have difficulty concentrating, remembering, or making decisions?: Yes Patient able to express need for assistance with ADLs?: Yes Does the patient have difficulty dressing or bathing?: No Independently performs ADLs?: Yes (appropriate for developmental age) Does the patient have difficulty walking or climbing stairs?: Yes Weakness of Legs: Both Weakness of Arms/Hands: None  Permission Sought/Granted Permission sought to share information with : Case Manager, Magazine features editor, Family Supports Permission granted to share information with : Yes, Verbal Permission Granted              Emotional Assessment Appearance:: Appears stated age Attitude/Demeanor/Rapport: Engaged, Gracious Affect (typically observed): Accepting, Appropriate, Calm, Hopeful Orientation: : Oriented to Self, Oriented to Place, Oriented to  Time, Oriented to Situation Alcohol / Substance Use: Not Applicable Psych Involvement: No (comment)  Admission diagnosis:  UTI due to extended-spectrum beta lactamase (ESBL) producing Escherichia coli [N39.0, B96.29, Z16.12] Patient Active Problem List   Diagnosis Date Noted   Murmur,  cardiac 05/25/2022   UTI due to extended-spectrum beta lactamase (ESBL) producing Escherichia coli    Encounter for screening mammogram for breast cancer 05/19/2022   Gastroesophageal reflux disease 05/19/2022   Nocturia 04/29/2022   History of ESBL E.  coli infection 04/29/2022   Need for home health care 04/29/2022   Hypothyroidism 03/03/2022   Pulmonary embolus (HCC)    Non-ST elevation (NSTEMI) myocardial infarction [facility])    Concussion wth loss of consciousness of 30 minutes or less    Paroxysmal atrial fibrillation (HCC)    Chronic bilateral low back pain without sciatica 01/14/2022   Seborrheic keratoses 07/09/2021   Parkinson disease (HCC) 04/23/2020   Chronic venous insufficiency 11/26/2019   Vitamin B12 deficiency 02/06/2018   Iron deficiency anemia due to chronic blood loss 01/29/2018   Renal insufficiency 01/29/2018   Pure hypercholesterolemia 12/24/2014   Diabetes mellitus type 2, controlled, without complications (HCC) 12/24/2014   Bruit of right carotid artery 12/24/2014   Thyroid disease 07/06/2014   Severe obesity (BMI >= 40) (HCC) 07/06/2014   Essential hypertension 07/06/2014   PCP:  Billey Co, MD Pharmacy:   [phone] [place], [place] - [address] [place] [address] Phone: [phone] Fax: [phone]     Social Determinants of Health (SDOH) Interventions    Readmission Risk Interventions     No data to display

## 2022-05-25 NOTE — Plan of Care (Signed)
  Problem: Health Behavior/Discharge Planning: Goal: Ability to manage health-related needs will improve Outcome: Progressing   Problem: Clinical Measurements: Goal: Will remain free from infection Outcome: Progressing   Problem: Clinical Measurements: Goal: Diagnostic test results will improve Outcome: Progressing   Problem: Activity: Goal: Risk for activity intolerance will decrease Outcome: Progressing   Problem: Elimination: Goal: Will not experience complications related to bowel motility Outcome: Progressing   Problem: Pain Managment: Goal: General experience of comfort will improve Outcome: Progressing

## 2022-05-25 NOTE — Evaluation (Signed)
Occupational Therapy Evaluation Patient Details Name: Anna Bernard MRN: [id number] DOB: [birth year] Today's Date: 05/25/2022   History of Present Illness Pt is a 71 y/o female admitted secondary to ESBL UTI. PMH includes HTN, DM, a fib, and parkinsons.   Clinical Impression   Patient poor historian and switching stories to state prior level of function. Currently, Patient presenting with decreased activity tolerance, R lateral lean, and poor cognition (patient stating it was November 18, 1949). Pt requiring mod A +2 to stand and take side steps and mod to max A with ADLs. Given patients current level, SNF level therapies recommended unless family is able to provide increased support at home. If increased support (24/7 supervision) max HH services can be entertained. OT will continue to follow acutely.      Recommendations for follow up therapy are one component of a multi-disciplinary discharge planning process, led by the attending physician.  Recommendations may be updated based on patient status, additional functional criteria and insurance authorization.   Follow Up Recommendations  Skilled nursing-short term rehab (<3 hours/day) (Unless family can provide increased support)    Assistance Recommended at Discharge Frequent or constant Supervision/Assistance  Patient can return home with the following Two people to help with walking and/or transfers;A lot of help with bathing/dressing/bathroom;Assistance with cooking/housework;Direct supervision/assist for medications management;Direct supervision/assist for financial management;Assist for transportation;Help with stairs or ramp for entrance    Functional Status Assessment  Patient has had a recent decline in their functional status and demonstrates the ability to make significant improvements in function in a reasonable and predictable amount of time.  Equipment Recommendations  Other (comment) (Defer to next venue)    Recommendations for  Other Services       Precautions / Restrictions Precautions Precautions: Fall Restrictions Weight Bearing Restrictions: No      Mobility Bed Mobility Overal bed mobility: Needs Assistance Bed Mobility: Supine to Sit, Sit to Supine     Supine to sit: Min assist Sit to supine: Min assist   General bed mobility comments: Assist to pull up into sitting. Assist for RLE for return to bed.    Transfers Overall transfer level: Needs assistance Equipment used: Rolling walker (2 wheels) Transfers: Sit to/from Stand Sit to Stand: Mod assist, +2 physical assistance           General transfer comment: mod A +2 to stand. R lateral lean in standing and requiring assist to correct. Able to take some side steps towards head of bed, but unable to perform further mobility.      Balance Overall balance assessment: Needs assistance Sitting-balance support: No upper extremity supported, Feet supported Sitting balance-Leahy Scale: Fair     Standing balance support: Bilateral upper extremity supported Standing balance-Leahy Scale: Poor Standing balance comment: R lateral lean. Reliant on UE and external support.                           ADL either performed or assessed with clinical judgement   ADL Overall ADL's : Needs assistance/impaired Eating/Feeding: Minimal assistance;Sitting   Grooming: Minimal assistance;Sitting   Upper Body Bathing: Moderate assistance;Sitting   Lower Body Bathing: Maximal assistance;Total assistance;Sitting/lateral leans;Sit to/from stand   Upper Body Dressing : Minimal assistance;Sitting   Lower Body Dressing: Maximal assistance;Total assistance;Sitting/lateral leans   Toilet Transfer: Moderate assistance;+2 for physical assistance;+2 for safety/equipment;Stand-pivot Toilet Transfer Details (indicate cue type and reason): simluated with sit<>stand at EOB, significant R lean noted in standing Toileting-  Clothing Manipulation and Hygiene:  Maximal assistance;Total assistance;Bed level       Functional mobility during ADLs: Maximal assistance;+2 for physical assistance;+2 for safety/equipment;Cueing for safety;Cueing for sequencing;Rolling walker (2 wheels) General ADL Comments: Patient presenting with decreased activity tolerance, R lateral lean, and poor cognition     Vision Baseline Vision/History: 1 Wears glasses Ability to See in Adequate Light: 0 Adequate Patient Visual Report: No change from baseline       Perception     Praxis      Pertinent Vitals/Pain Pain Assessment Pain Assessment: Faces Faces Pain Scale: Hurts even more Pain Location: R toes Pain Descriptors / Indicators: Grimacing, Guarding Pain Intervention(s): Limited activity within patient's tolerance, Monitored during session, Repositioned     Hand Dominance     Extremity/Trunk Assessment Upper Extremity Assessment Upper Extremity Assessment: Generalized weakness   Lower Extremity Assessment Lower Extremity Assessment: Defer to PT evaluation   Cervical / Trunk Assessment Cervical / Trunk Assessment: Kyphotic   Communication Communication Communication: No difficulties   Cognition Arousal/Alertness: Awake/alert Behavior During Therapy: WFL for tasks assessed/performed Overall Cognitive Status: No family/caregiver present to determine baseline cognitive functioning                                 General Comments: Poor memory noted. Pt also had difficulty recalling the date. Stated it was November 18, 1949     General Comments       Exercises     Shoulder Instructions      Home Living Family/patient expects to be discharged to:: Private residence Living Arrangements: Spouse/significant other Available Help at Discharge: Family Type of Home: House Home Access: Ramped entrance     Home Layout: Two level;Full bath on main level     Bathroom Shower/Tub: Producer, television/film/video: Standard     Home  Equipment: Rollator (4 wheels);Rolling Walker (2 wheels);BSC/3in1;Shower seat;Wheelchair - manual          Prior Functioning/Environment Prior Level of Function : Needs assist             Mobility Comments: Pt initially reports she Occasionally requires assist with ambulation using rollator. However, at end of session, reports she uses WC. Unsure of accuracy ADLs Comments: Husband assist with shower transfers        OT Problem List: Decreased strength;Decreased range of motion;Decreased activity tolerance;Impaired balance (sitting and/or standing);Decreased coordination;Decreased cognition;Decreased safety awareness;Decreased knowledge of use of DME or AE;Decreased knowledge of precautions;Pain      OT Treatment/Interventions: Self-care/ADL training;Therapeutic exercise;Energy conservation;DME and/or AE instruction;Manual therapy;Cognitive remediation/compensation;Patient/family education;Balance training;Therapeutic activities    OT Goals(Current goals can be found in the care plan section) Acute Rehab OT Goals Patient Stated Goal: to feel better OT Goal Formulation: With patient Time For Goal Achievement: 06/08/22 Potential to Achieve Goals: Fair ADL Goals Pt Will Perform Lower Body Bathing: with min assist;sit to/from stand;sitting/lateral leans Pt Will Perform Lower Body Dressing: with min assist;sitting/lateral leans;sit to/from stand Pt Will Transfer to Toilet: with min assist;stand pivot transfer;bedside commode Pt Will Perform Toileting - Clothing Manipulation and hygiene: with min guard assist;sitting/lateral leans;sit to/from stand Additional ADL Goal #1: Patient will demonstrate increased activity tolerance to engage in ADL or IADL task sitting EOB for 5-7 minutes without need for external support. Additional ADL Goal #2: Patient will be able to follow 1-2 step commands consistently to promote safety and independence at home.  OT Frequency: Min 2X/week  Co-evaluation PT/OT/SLP Co-Evaluation/Treatment: Yes Reason for Co-Treatment: Complexity of the patient's impairments (multi-system involvement);Necessary to address cognition/behavior during functional activity;For patient/therapist safety;To address functional/ADL transfers PT goals addressed during session: Mobility/safety with mobility;Balance OT goals addressed during session: Proper use of Adaptive equipment and DME;ADL's and self-care;Strengthening/ROM      AM-PAC OT "6 Clicks" Daily Activity     Outcome Measure Help from another person eating meals?: A Little Help from another person taking care of personal grooming?: A Little Help from another person toileting, which includes using toliet, bedpan, or urinal?: A Lot Help from another person bathing (including washing, rinsing, drying)?: A Lot Help from another person to put on and taking off regular upper body clothing?: A Little Help from another person to put on and taking off regular lower body clothing?: A Lot 6 Click Score: 15   End of Session Equipment Utilized During Treatment: Gait belt;Rolling walker (2 wheels) Nurse Communication: Mobility status;Other (comment) (new pure wick)  Activity Tolerance: Patient limited by fatigue Patient left: in bed;with call bell/phone within reach;with bed alarm set  OT Visit Diagnosis: Unsteadiness on feet (R26.81);Other abnormalities of gait and mobility (R26.89);Muscle weakness (generalized) (M62.81);Other symptoms and signs involving cognitive function;Pain Pain - Right/Left: Right Pain - part of body: Ankle and joints of foot (Toes)                Time: 5284-1324 OT Time Calculation (min): 18 min Charges:  OT General Charges $OT Visit: 1 Visit OT Evaluation $OT Eval Moderate Complexity: 1 Mod  Pollyann Glen E. Clemmie Buelna, OTR/L Acute Rehabilitation Services [phone]   Windi Toro 05/25/2022, 3:38 PM

## 2022-05-25 NOTE — Progress Notes (Signed)
FMTS Interim Progress Note  S:Called by nursing to assess patient for R leg swelling. Patient states the swelling has occurred for at least the past 3 years. States she has difficulty using her R leg resulting in limited mobility. Additionally states she cannot feel her right foot.  O: BP 102/63 (BP Location: Left Arm)   Pulse 80   Temp 98.4 F (36.9 C) (Oral)   Resp 18   Ht 5\' 2"  (1.575 m)   Wt 70.1 kg   SpO2 97%   BMI 28.27 kg/m    Exam: MSK:  R leg: Loss of sensation over right foot and ankle. Limited ROM of R knee, ankles and toes. 1/5 dorsiflexion/plantarflexion. 3/5 knee extension. 5/5 hip flexion. No erythema or swelling noted. L leg: Motor and sensation intact globally.  A/P: Diabetic neuropathy: -Lack of sensation likely a result of long-term T2DM -Advised patient she is on therapy here and to follow up outpatient  Parkinson disease: -Loss of motor function likely d/t PD diagnoses due to issue chronicity -Receiving OT/PT management to improve mobility -Advised patient to follow up with outpatient neurology for further recommendations  , MD 05/25/2022, 1:15 PM PGY-1, [facility] Family Medicine Service pager [phone]

## 2022-05-25 NOTE — Progress Notes (Addendum)
Mobility Specialist Progress Note:   05/25/22 1335  Mobility  Activity Moved into chair position in bed (repositioned in bed)  Level of Assistance Maximum assist, patient does 25-49%  Assistive Device None  Activity Response Tolerated well  $Mobility charge 1 Mobility   Pt requesting assistance with bed mobility. Husband states pt can not get OOB at this time d/t weakness. Pt able to perform bed mobility with maxA required for BLE. Left with all needs met.   Nelta Numbers Acute Rehab Secure Chat or Office Phone: [phone]

## 2022-05-25 NOTE — Evaluation (Signed)
Physical Therapy Evaluation Patient Details Name: Anna Bernard MRN: [id number] DOB: [birth year] Today's Date: 05/25/2022  History of Present Illness  Pt is a 71 y/o female admitted secondary to ESBL UTI. PMH includes HTN, DM, a fib, and parkinsons.  Clinical Impression  Pt admitted secondary to problem above with deficits below. Pt requiring mod A +2 to stand and take side steps. R lateral lean and required assist to correct. Pt with poor cognition and reporting conflicting information throughout. Given deficits, recommending SNF level therapies. However, if pt family prefers to take pt home and provide necessary assist, will need max HH Services. Will continue to follow acutely.        Recommendations for follow up therapy are one component of a multi-disciplinary discharge planning process, led by the attending physician.  Recommendations may be updated based on patient status, additional functional criteria and insurance authorization.  Follow Up Recommendations Skilled nursing-short term rehab (<3 hours/day) (unless family able to provide necessary support, then will need max HH) Can patient physically be transported by private vehicle: No    Assistance Recommended at Discharge Frequent or constant Supervision/Assistance  Patient can return home with the following  Two people to help with walking and/or transfers;Two people to help with bathing/dressing/bathroom;Assistance with cooking/housework;Help with stairs or ramp for entrance;Assist for transportation    Equipment Recommendations Wheelchair cushion (measurements PT);Wheelchair (measurements PT)  Recommendations for Other Services       Functional Status Assessment Patient has had a recent decline in their functional status and demonstrates the ability to make significant improvements in function in a reasonable and predictable amount of time.     Precautions / Restrictions Precautions Precautions: Fall Restrictions Weight  Bearing Restrictions: No      Mobility  Bed Mobility Overal bed mobility: Needs Assistance Bed Mobility: Supine to Sit, Sit to Supine     Supine to sit: Min assist Sit to supine: Min assist   General bed mobility comments: Assist to pull up into sitting. Assist for RLE for return to bed.    Transfers Overall transfer level: Needs assistance Equipment used: Rolling walker (2 wheels) Transfers: Sit to/from Stand Sit to Stand: Mod assist, +2 physical assistance           General transfer comment: mod A +2 to stand. R lateral lean in standing and requiring assist to correct. Able to take some side steps towards head of bed, but unable to perform further mobility.    Ambulation/Gait                  Stairs            Wheelchair Mobility    Modified Rankin (Stroke Patients Only)       Balance Overall balance assessment: Needs assistance Sitting-balance support: No upper extremity supported, Feet supported Sitting balance-Leahy Scale: Fair     Standing balance support: Bilateral upper extremity supported Standing balance-Leahy Scale: Poor Standing balance comment: R lateral lean. Reliant on UE and external support.                             Pertinent Vitals/Pain Pain Assessment Pain Assessment: Faces Faces Pain Scale: Hurts even more Pain Location: R toes Pain Descriptors / Indicators: Grimacing, Guarding Pain Intervention(s): Monitored during session, Limited activity within patient's tolerance, Repositioned    Home Living Family/patient expects to be discharged to:: Private residence Living Arrangements: Spouse/significant other Available Help at Discharge: Family Type  of Home: House Home Access: Ramped entrance       Home Layout: Two level;Full bath on main level Home Equipment: Rollator (4 wheels);Rolling Walker (2 wheels);BSC/3in1;Shower seat;Wheelchair - manual      Prior Function Prior Level of Function : Needs  assist             Mobility Comments: Pt initially reports she Occasionally requires assist with ambulation using rollator. However, at end of session, reports she uses WC. Unsure of accuracy ADLs Comments: Husband assist with shower transfers     Hand Dominance        Extremity/Trunk Assessment   Upper Extremity Assessment Upper Extremity Assessment: Defer to OT evaluation    Lower Extremity Assessment Lower Extremity Assessment: Generalized weakness    Cervical / Trunk Assessment Cervical / Trunk Assessment: Kyphotic  Communication   Communication: No difficulties  Cognition Arousal/Alertness: Awake/alert Behavior During Therapy: WFL for tasks assessed/performed Overall Cognitive Status: No family/caregiver present to determine baseline cognitive functioning                                 General Comments: Poor memory noted. Pt also had difficulty recalling the date.        General Comments      Exercises     Assessment/Plan    PT Assessment Patient needs continued PT services  PT Problem List Decreased strength;Decreased activity tolerance;Decreased balance;Decreased mobility;Decreased knowledge of use of DME;Decreased knowledge of precautions       PT Treatment Interventions DME instruction;Gait training;Functional mobility training;Stair training;Therapeutic activities;Therapeutic exercise;Patient/family education;Balance training;Neuromuscular re-education;Cognitive remediation    PT Goals (Current goals can be found in the Care Plan section)  Acute Rehab PT Goals Patient Stated Goal: to go home PT Goal Formulation: With patient Time For Goal Achievement: 06/08/22 Potential to Achieve Goals: Fair    Frequency Min 3X/week     Co-evaluation PT/OT/SLP Co-Evaluation/Treatment: Yes Reason for Co-Treatment: For patient/therapist safety;To address functional/ADL transfers PT goals addressed during session: Mobility/safety with  mobility;Balance         AM-PAC PT "6 Clicks" Mobility  Outcome Measure Help needed turning from your back to your side while in a flat bed without using bedrails?: A Little Help needed moving from lying on your back to sitting on the side of a flat bed without using bedrails?: A Little Help needed moving to and from a bed to a chair (including a wheelchair)?: Total Help needed standing up from a chair using your arms (e.g., wheelchair or bedside chair)?: Total Help needed to walk in hospital room?: Total Help needed climbing 3-5 steps with a railing? : Total 6 Click Score: 10    End of Session Equipment Utilized During Treatment: Gait belt Activity Tolerance: Patient tolerated treatment well Patient left: in bed;with call bell/phone within reach;with bed alarm set Nurse Communication: Mobility status PT Visit Diagnosis: Unsteadiness on feet (R26.81);Muscle weakness (generalized) (M62.81);Difficulty in walking, not elsewhere classified (R26.2)    Time: 1224-8250 PT Time Calculation (min) (ACUTE ONLY): 18 min   Charges:   PT Evaluation $PT Eval Moderate Complexity: 1 Mod          Farley Ly, PT, DPT  Acute Rehabilitation Services  Office: [phone]   Lehman Prom 05/25/2022, 2:59 PM

## 2022-05-25 NOTE — Progress Notes (Addendum)
Daily Progress Note Intern Pager: [phone]  Patient name: Anna Bernard Medical record number: [id number] Date of birth: [birth year] Age: 71 y.o. Gender: female  Primary Care Provider: Billey Co, MD Consultants: None Code Status: FULL   Pt Overview and Major Events to Date:  9/6 - admitted   Assessment and Plan: Anna Bernard is a 71 yo female with a PMH of HTN, HLD, DM2, anemia, paroxysmal atrial fibrillation, hypothyroidism, Parkinson's disease admitted for symptomatic recurrent ESBL UTI treatment.   * UTI due to extended-spectrum beta lactamase (ESBL) producing Escherichia coli Patient with urinary frequency, nocturia and foul urine odor. WBC 6.0. Urine culture confirmed ESBL E. coli UTI, admitted for IV abx. Blood cultures not obtained as abx have been started. - Consider blood cultures if patient develops fever/chills/WBC count, monitor urine cultures  - Monitor closely for pyelo sx - Meropenem IV per pharmacy - F/u post-void residual  - Consider ID consult, pending additional Abx needs on discharge  Essential hypertension BP 158/77 on admission.Currently stable. - Monitor with routine vitals - Irbesartan 150mg  daily  Murmur, cardiac 4/6 systolic continuous murmur present. Most recent Echo on 01/17/22 could not exclude small PFO, but noted no aortic stenosis or mitral regurgitation.  - Asymptomatic, consider repeat Echo if develops chest pain, SOB, presyncope, etc.    Chronic problem with home medications: Paroxysmal A-fib: Eliquis 5mg  BID Hypothyroidism: Synthroid 03/19/22 Parkinson disease: Sinemet 25-100mg  Hypercholesterolemia: Rosuvastatin 10mg    FEN/GI: Normal diet  PPx: Eliquis  Dispo:Home pending clinical improvement . Barriers include ESBL UTI treatment.   Subjective:  Patient was given Miralax x 1 overnight for constipation. Patient was seen at bedside resting comfortably this morning. Endorses eating her breakfast. Denies dysuria, abdominal pain,  fevers, chills, urinary urgency, chest pain, SOB.   Objective: Temp:  [98.2 F (36.8 C)-98.6 F (37 C)] 98.6 F (37 C) (09/06 0508) Pulse Rate:  [75-82] 75 (09/06 0508) Resp:  [16-18] 18 (09/06 0508) BP: (109-158)/(51-77) 124/63 (09/06 0508) SpO2:  [98 %-100 %] 100 % (09/06 0508) Weight:  [70.1 kg] 70.1 kg (09/05 1607) Physical Exam: General: alert and oriented, in no apparent distress. Cardiovascular: 4/6 systolic continuous murmur present at aortic and pulmonary ascultation points Respiratory: CTAB Abdomen: Soft, no tenderness to palpation  Extremities: No swelling or edema.   Laboratory: Most recent CBC Lab Results  Component Value Date   WBC 6.0 05/24/2022   HGB 9.8 (L) 05/24/2022   HCT 28.8 (L) 05/24/2022   MCV 74.2 (L) 05/24/2022   PLT 295 05/24/2022   Most recent BMP    Latest Ref Rng & Units 05/24/2022    5:53 PM  BMP  Glucose 70 - 99 mg/dL 77   BUN 8 - 23 mg/dL 17   Creatinine 07/24/2022 - 1.00 mg/dL 07/24/2022   Sodium 07/24/2022 - 5.78 mmol/L 140   Potassium 3.5 - 5.1 mmol/L 3.7   Chloride 98 - 111 mmol/L 104   CO2 22 - 32 mmol/L 28   Calcium 8.9 - 10.3 mg/dL 8.9     4.69, Medical Student 05/25/2022, 8:59 AM [facility] FPTS Intern pager: [phone], text pages welcome Secure chat group [facility] Teaching Service      Upper Level Addendum:  I have seen and evaluated this patient along with Student Dr. [phone] and reviewed the above note, making necessary revisions as appropriate.  I agree with the medical decision making and physical exam as noted above.  Unable to obtain  blood cx prior to abx, no need for these now. Follow murmur via examination. Mons-pubis lesion? Not visualized on my exam, will monitor.    Alfredo Martinez, MD PGY-3 [facility] Family Medicine Residency

## 2022-05-25 NOTE — Progress Notes (Signed)
   05/25/22 1230  Clinical Encounter Type  Visited With Patient and family together  Visit Type Initial  Referral From Nurse  Consult/Referral To Chaplain   Chaplain provided education regarding the HCPOA for patient and husband. Chaplain left 2 copies of document and asked patient to call the Chaplain if she wanted to have documents executed.    Jon Gills, Resident Chaplain [phone]

## 2022-05-25 NOTE — Assessment & Plan Note (Signed)
4/6 systolic continuous murmur present. Most recent Echo on 01/17/22 could not exclude small PFO, but noted no aortic stenosis or mitral regurgitation.  - Asymptomatic, consider repeat Echo if develops chest pain, SOB, presyncope, etc.

## 2022-05-25 NOTE — Care Management Obs Status (Signed)
MEDICARE OBSERVATION STATUS NOTIFICATION   Patient Details  Name: Anna Bernard MRN: [id number] Date of Birth: 07/20/1951   Medicare Observation Status Notification Given:  Yes    Tom-Johnson, Hershal Coria, RN 05/25/2022, 1:35 PM

## 2022-05-26 ENCOUNTER — Telehealth: Payer: Self-pay | Admitting: Cardiology

## 2022-05-26 ENCOUNTER — Telehealth (HOSPITAL_COMMUNITY): Payer: Self-pay | Admitting: Pharmacy Technician

## 2022-05-26 ENCOUNTER — Other Ambulatory Visit (HOSPITAL_COMMUNITY): Payer: Self-pay

## 2022-05-26 DIAGNOSIS — G20 Parkinson's disease: Secondary | ICD-10-CM | POA: Diagnosis not present

## 2022-05-26 DIAGNOSIS — I10 Essential (primary) hypertension: Secondary | ICD-10-CM | POA: Diagnosis not present

## 2022-05-26 DIAGNOSIS — I480 Paroxysmal atrial fibrillation: Secondary | ICD-10-CM | POA: Diagnosis not present

## 2022-05-26 DIAGNOSIS — N390 Urinary tract infection, site not specified: Secondary | ICD-10-CM | POA: Diagnosis not present

## 2022-05-26 MED ORDER — METOPROLOL TARTRATE 12.5 MG HALF TABLET
12.5000 mg | ORAL_TABLET | Freq: Two times a day (BID) | ORAL | Status: DC
  Administered 2022-05-26 – 2022-05-27 (×2): 12.5 mg via ORAL
  Filled 2022-05-26 (×2): qty 1

## 2022-05-26 MED ORDER — POLYETHYLENE GLYCOL 3350 17 G PO PACK
17.0000 g | PACK | Freq: Every day | ORAL | Status: DC
  Administered 2022-05-26 – 2022-05-27 (×2): 17 g via ORAL
  Filled 2022-05-26 (×2): qty 1

## 2022-05-26 NOTE — Assessment & Plan Note (Addendum)
Has led to loss of strength/motor function in right lower leg.  - On carbidopa/levodopa  - PT/OT consult, recommended skilled nursing-short term rehab, patient and family declined and would like home health PT

## 2022-05-26 NOTE — Progress Notes (Signed)
FMTS Interim Progress Note  S: Saw patient at bedside, I readjusted her pillow for her as she was saying her back was hurting. I updated that insurance has approved for her to get fosfomycin PO, so she can likely be discharged tomorrow with 2 fosfomycin doses. In addition, we discussed PT recommendations for short term rehab at [facility] She declined that option, and prefers to go home. She is currently working with home health PT but does not feel they are helping her because her main mobility limitation is her hammertoes on her left foot. She also has diabetic neuropathy and Parkinson's disease (causing left lower extremity weakness). We discussed that she has an appointment with podiatry next week and they should be able to help with her toe deformity. She is happy about the opportunity to go home.   O: BP (!) 167/76 (BP Location: Left Arm)   Pulse 67   Temp 98.1 F (36.7 C)   Resp 18   Ht 5\' 2"  (1.575 m)   Wt 70.1 kg   SpO2 100%   BMI 28.27 kg/m     A/P: - Continue meropenem IV overnight  - Plan to d/c home with home health PT tomorrow with two fosfomycin doses  , Medical Student 05/26/2022, 4:40 PM [facility] Health Family Medicine Service pager [phone]

## 2022-05-26 NOTE — Progress Notes (Addendum)
FMTS Interim Progress Note  S: I saw patient at bedside after seeing RN notes about daughter call to hospital with concerns about A Fib and difficulty breathing. I asked Anna Bernard if she has had any shortness of breath today, and she said earlier when she was aggravated. I asked why she was aggravated and she said she didn't want to talk about it. She then said she sometimes gets aggravated when she eats too much, so I clarified with her that she has had indigestion and sometimes that makes her feel breathlessness. I explained that she does have chronic A fib but that it is well controlled on metoprolol and Eliquis which she is taking here. She denied any SOB and I did not appreciate any increased WOB currently. I told her to let someone know if she has any chest pain or SOB. Patient verbalized agreement and understanding. She said that her daughter is an only child and gets worried about her a lot.   O: BP (!) 167/76 (BP Location: Left Arm)   Pulse 67   Temp 98.1 F (36.7 C)   Resp 18   Ht 5\' 2"  (1.575 m)   Wt 70.1 kg   SpO2 100%   BMI 28.27 kg/m   Heart: RRR S1 S2 present. No rubs, murmurs, gallops.  Lung: CTAB. No increased WOB.  Extremities: Radialis pulse 2+ left arm   A/P: - continue current management (Metop, Eliquis)   , Medical Student 05/26/2022, 5:38 PM [facility] Health Family Medicine Service pager [phone]

## 2022-05-26 NOTE — Assessment & Plan Note (Addendum)
Chronic - On Eliquis 5 mg BID  - On Lopressor 12.5 mg

## 2022-05-26 NOTE — Telephone Encounter (Signed)
Patient Advocate Encounter   Received notification that prior authorization for Fosfomycin Tromethamine 3GM packets is required.   PA submitted on [id number] Key [id number] Status is pending       Anna Bernard, CPhT Pharmacy Patient Advocate Specialist [facility] Pharmacy Patient Advocate Team Direct Number: [phone]  Fax: [phone]

## 2022-05-26 NOTE — Telephone Encounter (Signed)
Patient Advocate Encounter  Prior Authorization for Fosfomycin Tromethamine 3GM packets has been approved.    PA# [id number] Effective dates: 05/26/2022 through 09/18/2022  Patients co-pay is $69.95.     Roland Earl, CPhT Pharmacy Patient Advocate Specialist [facility] Health Pharmacy Patient Advocate Team Direct Number: [phone]  Fax: [phone]

## 2022-05-26 NOTE — Progress Notes (Signed)
Daily Progress Note Intern Pager: [phone]  Patient name: Anna Bernard Medical record number: [id number] Date of birth: 03/29/1951 Age: 71 y.o. Gender: female  Primary Care Provider: Billey Co, MD Consultants: None  Code Status: FULL  Pt Overview and Major Events to Date:  9/6 - admitted   Assessment and Plan: Anna Bernard is a 71 yo female with a PMH of HTN, HLD, DM2, anemia, paroxysmal atrial fibrillation, hypothyroidism, Parkinson's disease admitted for symptomatic recurrent ESBL UTI treatment.   * UTI due to extended-spectrum beta lactamase (ESBL) producing Escherichia coli Patient with urinary frequency, nocturia and foul urine odor. WBC 6.0. Urine culture confirmed ESBL E. coli UTI, admitted for IV abx. Blood cultures not obtained as abx have been started. - Consider blood cultures if patient develops fever/chills/WBC count, monitor urine cultures  - Monitor closely for pyelo sx - Continue Meropenem IV until lunchtime tomorrow, send her out with fosfomycin PO dose and then another in 3 days  - F/u post-void residual  - Consider ID consult, pending additional Abx needs on discharge  Essential hypertension BP 158/77 on admission.Currently stable. - Monitor with routine vitals - Irbesartan 150mg  daily  Parkinson disease (HCC) Has led to loss of strength/motor function in right lower leg. Patient has a scheduled appointment on 09/14 with Dr. 10/14 at [facility] in [place]. - On carbidopa/levodopa  - PT/OT consult, recommended skilled nursing-short term rehab   Murmur, cardiac 4/6 systolic continuous murmur present. Most recent Echo on 01/17/22 could not exclude small PFO, but noted no aortic stenosis or mitral regurgitation.  - Asymptomatic, consider repeat Echo if develops chest pain, SOB, presyncope, etc.     FEN/GI: Normal diet  PPx: Eliquis  Dispo:Home tomorrow. Barriers include ESBL UTI treatment IV until tomorrow.   Subjective:   Denies dysuria, abdominal pain, fevers, chills. Endorses urinary frequency/urgency, and has PureWick placed.   Objective: Temp:  [98.4 F (36.9 C)-98.6 F (37 C)] 98.6 F (37 C) (09/07 0521) Pulse Rate:  [70-80] 70 (09/07 0521) Resp:  [18] 18 (09/07 0521) BP: (102-138)/(52-80) 135/80 (09/07 0521) SpO2:  [97 %-100 %] 100 % (09/07 0521) Physical Exam: General: alert and oriented, in no apparent distress. Cardiovascular: 4/6 systolic continuous murmur present at aortic and pulmonary ascultation points Respiratory: CTAB Abdomen: Soft, no tenderness to palpation  Extremities: No swelling in right or left lower extremities.  Neuro:  R leg: Sensation intact in R foot and ankle bilaterally. 4/5 hip flexion, 2/5 dorsiflexion/plantarflexion. No erythema or swelling noted bilaterally. Deformity of 2nd and 3rd MTP joints noted.  L leg: Motor and sensation intact globally.   Laboratory: Most recent CBC Lab Results  Component Value Date   WBC 7.5 05/25/2022   HGB 10.0 (L) 05/25/2022   HCT 28.8 (L) 05/25/2022   MCV 74.2 (L) 05/25/2022   PLT 296 05/25/2022   Most recent BMP    Latest Ref Rng & Units 05/25/2022    8:13 AM  BMP  Glucose 70 - 99 mg/dL 07/25/2022   BUN 8 - 23 mg/dL 16   Creatinine 102 - 1.00 mg/dL 7.25   Sodium 3.66 - 440 mmol/L 141   Potassium 3.5 - 5.1 mmol/L 3.8   Chloride 98 - 111 mmol/L 106   CO2 22 - 32 mmol/L 28   Calcium 8.9 - 10.3 mg/dL 8.7    None new   347, Medical Student 05/26/2022, 9:43 AM [facility] FPTS Intern pager: [phone], text  pages welcome Secure chat group [facility] Teaching Service     Resident attestation: I agree with the documentation of Student Dr. Nicholas Lose above. I have made adjustments to her note as appropriate. I have seen the patient and performed physical exam on the patient consistent with her documented physical exam above.  Alfredo Martinez, MD

## 2022-05-26 NOTE — Telephone Encounter (Signed)
Patient's daughter called to say that her mother is in the hospital.  She said she is having trouble breathing and they are not doing anything in the hospital.  However, when she first found out she had A-Fib she started out she had trouble breathing.  Daughter states she has be having trouble breathing all day.

## 2022-05-26 NOTE — Plan of Care (Signed)
  Problem: Activity: Goal: Risk for activity intolerance will decrease Outcome: Progressing   

## 2022-05-26 NOTE — TOC Progression Note (Signed)
Transition of Care [facility]) - Initial/Assessment Note    Patient Details  Name: Anna Bernard MRN: [id number] Date of Birth: Jun 16, 1951  Transition of Care [facility]) CM/SW Contact:    Milinda Antis, [place] Phone Number: 05/26/2022, 12:01 PM  Clinical Narrative:                 CSW received SNF referral and met with the patient at bedside.  PT was present during encounter and the spouse and patient's daughter were present via phone.  The family declined SNF and requested that the patient goes home with home health.  The patient's spouse, daughter, and granddaughter are aware of the level of assistance that the patient needs and is willing to provide the care.  Patient was active with [facility] home [facility] prior to hospitalization.  The patient has a wheelchair, walker, and bedside commode at the home.  RNCM notified.    Barriers to Discharge: Continued Medical Work up   Patient Goals and CMS Choice Patient states their goals for this hospitalization and ongoing recovery are:: To return home CMS Medicare.gov Compare Post Acute Care list provided to:: Patient Choice offered to / list presented to : Patient, Spouse  Expected Discharge Plan and Services     Discharge Planning Services: CM Consult   Living arrangements for the past 2 months: Single Family Home                                      Prior Living Arrangements/Services Living arrangements for the past 2 months: Single Family Home Lives with:: Spouse Patient language and need for interpreter reviewed:: Yes Do you feel safe going back to the place where you live?: Yes      Need for Family Participation in Patient Care: Yes (Comment) Care giver support system in place?: Yes (comment) Current home services: DME (Cane, walker, shower chari and grab bars.) Criminal Activity/Legal Involvement Pertinent to Current Situation/Hospitalization: No - Comment as needed  Activities of Daily Living Home Assistive Devices/Equipment:  Eyeglasses, Wheelchair, Environmental consultant (specify type) (roller walker) ADL Screening (condition at time of admission) Patient's cognitive ability adequate to safely complete daily activities?: Yes Is the patient deaf or have difficulty hearing?: No Does the patient have difficulty seeing, even when wearing glasses/contacts?: No Does the patient have difficulty concentrating, remembering, or making decisions?: Yes Patient able to express need for assistance with ADLs?: Yes Does the patient have difficulty dressing or bathing?: No Independently performs ADLs?: Yes (appropriate for developmental age) Does the patient have difficulty walking or climbing stairs?: Yes Weakness of Legs: Both Weakness of Arms/Hands: None  Permission Sought/Granted Permission sought to share information with : Case Manager, Customer service manager, Family Supports Permission granted to share information with : Yes, Verbal Permission Granted              Emotional Assessment Appearance:: Appears stated age Attitude/Demeanor/Rapport: Engaged, Gracious Affect (typically observed): Accepting, Appropriate, Calm, Hopeful Orientation: : Oriented to Self, Oriented to Place, Oriented to  Time, Oriented to Situation Alcohol / Substance Use: Not Applicable Psych Involvement: No (comment)  Admission diagnosis:  UTI due to extended-spectrum beta lactamase (ESBL) producing Escherichia coli [N39.0, B96.29, Z16.12] Patient Active Problem List   Diagnosis Date Noted   Murmur, cardiac 05/25/2022   UTI due to extended-spectrum beta lactamase (ESBL) producing Escherichia coli    Encounter for screening mammogram for breast cancer 05/19/2022   Gastroesophageal  reflux disease 05/19/2022   Nocturia 04/29/2022   History of ESBL E. coli infection 04/29/2022   Need for home health care 04/29/2022   Hypothyroidism 03/03/2022   Pulmonary embolus (HCC)    Non-ST elevation (NSTEMI) myocardial infarction [facility])    Concussion wth  loss of consciousness of 30 minutes or less    Paroxysmal atrial fibrillation (HCC)    Chronic bilateral low back pain without sciatica 01/14/2022   Seborrheic keratoses 07/09/2021   Parkinson disease ([place]) 04/23/2020   Chronic venous insufficiency 11/26/2019   Vitamin B12 deficiency 02/06/2018   Iron deficiency anemia due to chronic blood loss 01/29/2018   Renal insufficiency 01/29/2018   Pure hypercholesterolemia 12/24/2014   Diabetes mellitus type 2, controlled, without complications ([place]) 20/23/3435   Bruit of right carotid artery 12/24/2014   Thyroid disease 07/06/2014   Severe obesity (BMI >= 40) ([place]) 07/06/2014   Essential hypertension 07/06/2014   PCP:  [name], MD Pharmacy:   [id number]: [phone]: [phone]    Social Determinants of Health (SDOH) Interventions    Readmission Risk Interventions     No data to display

## 2022-05-26 NOTE — Telephone Encounter (Signed)
Spoke with the patient's daughter who is concerned because the patient is currently in the hospital and is having difficulty breathing. She states that they keep telling her that her O2 is at 100% so there is no concern. The daughter is worried because the patient had similar symptoms when she was in A Fib. I have advised her to speak with the nurse there and let her know, they can get her connected to a heart monitor to monitor her rhythm. She verbalized understanding.

## 2022-05-26 NOTE — Progress Notes (Signed)
Physical Therapy Treatment Patient Details Name: Anna Bernard MRN: [id number] DOB: 07/28/1951 Today's Date: 05/26/2022   History of Present Illness Pt is a 71 y/o female admitted secondary to ESBL UTI. PMH includes HTN, DM, a fib, and parkinsons.    PT Comments    Pt is making progress toward her goals, however continues to require mod-maxA for bed mobility and stepping transfer to recliner. PT continues to recommend SNF for strengthening prior to going home, and was able to speak with patient and family  over speaker phone to discuss pt need. Family would like home with HHPT. CSW in room during conversation.    Recommendations for follow up therapy are one component of a multi-disciplinary discharge planning process, led by the attending physician.  Recommendations may be updated based on patient status, additional functional criteria and insurance authorization.  Follow Up Recommendations  Skilled nursing-short term rehab (<3 hours/day) Can patient physically be transported by private vehicle: No   Assistance Recommended at Discharge Frequent or constant Supervision/Assistance  Patient can return home with the following Two people to help with walking and/or transfers;Two people to help with bathing/dressing/bathroom;Assistance with cooking/housework;Help with stairs or ramp for entrance;Assist for transportation   Equipment Recommendations  Wheelchair cushion (measurements PT);Wheelchair (measurements PT)       Precautions / Restrictions Precautions Precautions: Fall Restrictions Weight Bearing Restrictions: No     Mobility  Bed Mobility Overal bed mobility: Needs Assistance Bed Mobility: Supine to Sit     Supine to sit: Mod assist     General bed mobility comments: modA for flexion of knees and pad assist to lift hipsx 5 to bring to EoB, pt able to reach across tobed rail to assist in moving LE to EoB, mod A for bringing trunk to upright    Transfers Overall transfer  level: Needs assistance Equipment used: Rolling walker (2 wheels) Transfers: Sit to/from Stand, Bed to chair/wheelchair/BSC Sit to Stand: Max assist   Step pivot transfers: Mod assist       General transfer comment: maxA for coming to standing and achieving balance over BoS due to increased posterior right lean, mod physical A and max verbal cuing for sequencing stepping to recliner           Balance Overall balance assessment: Needs assistance Sitting-balance support: No upper extremity supported, Feet supported Sitting balance-Leahy Scale: Fair     Standing balance support: Bilateral upper extremity supported Standing balance-Leahy Scale: Poor Standing balance comment: R lateral lean. Reliant on UE and external support.                            Cognition Arousal/Alertness: Awake/alert Behavior During Therapy: WFL for tasks assessed/performed Overall Cognitive Status: No family/caregiver present to determine baseline cognitive functioning                                 General Comments: unable to recall SNF facility she was at most recently        Exercises General Exercises - Lower Extremity Ankle Circles/Pumps: AROM, Both, 10 reps, Supine Heel Slides: AAROM, Both, 10 reps, Supine Hip ABduction/ADduction: AAROM, Both, 10 reps, Supine Straight Leg Raises: AAROM, Both, 10 reps, Supine    General Comments General comments (skin integrity, edema, etc.): VSS on RA      Pertinent Vitals/Pain Pain Assessment Pain Assessment: Faces Faces Pain Scale: Hurts even more Pain  Location: R toes Pain Descriptors / Indicators: Grimacing, Guarding Pain Intervention(s): Limited activity within patient's tolerance, Monitored during session, Repositioned     PT Goals (current goals can now be found in the care plan section) Acute Rehab PT Goals Patient Stated Goal: to go home PT Goal Formulation: With patient Time For Goal Achievement:  06/08/22 Potential to Achieve Goals: Fair Progress towards PT goals: Progressing toward goals    Frequency    Min 3X/week      PT Plan Current plan remains appropriate (however family refusing SNF at this time, educated on increased amount of physical assist required for mobility)       AM-PAC PT "6 Clicks" Mobility   Outcome Measure  Help needed turning from your back to your side while in a flat bed without using bedrails?: A Little Help needed moving from lying on your back to sitting on the side of a flat bed without using bedrails?: A Lot Help needed moving to and from a bed to a chair (including a wheelchair)?: Total Help needed standing up from a chair using your arms (e.g., wheelchair or bedside chair)?: Total Help needed to walk in hospital room?: Total Help needed climbing 3-5 steps with a railing? : Total 6 Click Score: 9    End of Session Equipment Utilized During Treatment: Gait belt Activity Tolerance: Patient tolerated treatment well Patient left: in bed;with call bell/phone within reach;with bed alarm set Nurse Communication: Mobility status PT Visit Diagnosis: Unsteadiness on feet (R26.81);Muscle weakness (generalized) (M62.81);Difficulty in walking, not elsewhere classified (R26.2)     Time: 9024-0973 PT Time Calculation (min) (ACUTE ONLY): 38 min  Charges:  $Therapeutic Exercise: 8-22 mins $Therapeutic Activity: 8-22 mins                     Mace Weinberg B. Beverely Risen PT, DPT Acute Rehabilitation Services Please use secure chat or  Call Office [phone]    Elon Alas [facility] 05/26/2022, 12:47 PM

## 2022-05-27 ENCOUNTER — Other Ambulatory Visit (HOSPITAL_COMMUNITY): Payer: Self-pay

## 2022-05-27 DIAGNOSIS — M2041 Other hammer toe(s) (acquired), right foot: Principal | ICD-10-CM

## 2022-05-27 DIAGNOSIS — I480 Paroxysmal atrial fibrillation: Secondary | ICD-10-CM | POA: Diagnosis not present

## 2022-05-27 DIAGNOSIS — N390 Urinary tract infection, site not specified: Secondary | ICD-10-CM | POA: Diagnosis not present

## 2022-05-27 DIAGNOSIS — G20 Parkinson's disease: Secondary | ICD-10-CM | POA: Diagnosis not present

## 2022-05-27 DIAGNOSIS — B9629 Other Escherichia coli [E. coli] as the cause of diseases classified elsewhere: Secondary | ICD-10-CM | POA: Diagnosis not present

## 2022-05-27 LAB — BASIC METABOLIC PANEL
Anion gap: 6 (ref 5–15)
BUN: 14 mg/dL (ref 8–23)
CO2: 28 mmol/L (ref 22–32)
Calcium: 8.8 mg/dL — ABNORMAL LOW (ref 8.9–10.3)
Chloride: 107 mmol/L (ref 98–111)
Creatinine, Ser: 0.65 mg/dL (ref 0.44–1.00)
GFR, Estimated: >60 mL/min (ref >60)
Glucose, Bld: 108 mg/dL — ABNORMAL HIGH (ref 70–99)
Potassium: 3.9 mmol/L (ref 3.5–5.1)
Sodium: 141 mmol/L (ref 135–145)

## 2022-05-27 LAB — CBC
HCT: 31 % — ABNORMAL LOW (ref 36.0–46.0)
Hemoglobin: 10.4 g/dL — ABNORMAL LOW (ref 12.0–15.0)
MCH: 25.3 pg — ABNORMAL LOW (ref 26.0–34.0)
MCHC: 33.5 g/dL (ref 30.0–36.0)
MCV: 75.4 fL — ABNORMAL LOW (ref 80.0–100.0)
Platelets: 296 10*3/uL (ref 150–400)
RBC: 4.11 MIL/uL (ref 3.87–5.11)
RDW: 14.3 % (ref 11.5–15.5)
WBC: 4.6 10*3/uL (ref 4.0–10.5)
nRBC: 0 % (ref 0.0–0.2)

## 2022-05-27 MED ORDER — FOSFOMYCIN TROMETHAMINE 3 G PO PACK
3.0000 g | PACK | Freq: Once | ORAL | 0 refills | Status: AC
  Filled 2022-05-27: qty 3, 1d supply, fill #0

## 2022-05-27 MED ORDER — METOPROLOL TARTRATE 25 MG PO TABS
12.5000 mg | ORAL_TABLET | Freq: Two times a day (BID) | ORAL | 0 refills | Status: DC
  Filled 2022-05-27: qty 30, 30d supply, fill #0

## 2022-05-27 MED ORDER — FOSFOMYCIN TROMETHAMINE 3 G PO PACK
3.0000 g | PACK | Freq: Once | ORAL | Status: AC
Start: 2022-05-27 — End: 2022-05-27
  Administered 2022-05-27: 3 g via ORAL
  Filled 2022-05-27: qty 3

## 2022-05-27 MED ORDER — FOSFOMYCIN TROMETHAMINE 3 G PO PACK: 3.0000 g | PACK | Freq: Once | ORAL | Status: DC

## 2022-05-27 MED ORDER — SENNA 8.6 MG PO TABS
1.0000 | ORAL_TABLET | Freq: Every day | ORAL | Status: DC
  Administered 2022-05-27: 8.6 mg via ORAL
  Filled 2022-05-27: qty 1

## 2022-05-27 NOTE — TOC Progression Note (Signed)
Transition of Care [facility]) - Progression Note    Patient Details  Name: Anna Bernard MRN: [id number] Date of Birth: 07/19/1951  [facility]) CM/SW Contact  Tom-Johnson, Hershal Coria, RN Phone Number: 05/27/2022, 11:15 AM  Clinical Narrative:     CM notified by CSW that patient declined SNF and requesting Home health.  CM spoke with patient and she states she is active with [facility]. CM spoke with Tresa Endo and resumption of care accepted, info on AVS. CM will continue to follow as patient progresses with care.     Barriers to Discharge: Continued Medical Work up  Expected Discharge Plan and Services     Discharge Planning Services: CM Consult   Living arrangements for the past 2 months: Single Family Home                                       Social Determinants of Health (SDOH) Interventions    Readmission Risk Interventions     No data to display

## 2022-05-27 NOTE — Plan of Care (Signed)
  Problem: Safety: Goal: Ability to remain free from injury will improve Outcome: Progressing   

## 2022-05-27 NOTE — Assessment & Plan Note (Addendum)
Patient describes this as the main limitation to her mobility.  - Patient has a scheduled appointment on 06/02/22 with Dr. Annamary Rummage at [facility] in [place].

## 2022-05-27 NOTE — TOC Transition Note (Signed)
Transition of Care [facility]) - CM/SW Discharge Note   Patient Details  Name: Anna Bernard MRN: [id number] Date of Birth: 09/09/1951  [facility] of [facility]) CM/SW Contact:  Tom-Johnson, Hershal Coria, RN Phone Number: 05/27/2022, 4:04 PM   Clinical Narrative:     Patient is scheduled for discharge today. Home health info on AVS. Husband to transport at discharge. No further TOC needs noted.    Final next level of care: Home w Home Health Services Barriers to Discharge: Continued Medical Work up   Patient Goals and CMS Choice Patient states their goals for this hospitalization and ongoing recovery are:: To return home CMS Medicare.gov Compare Post Acute Care list provided to:: Patient Choice offered to / list presented to : Patient, Spouse  Discharge Placement                Patient to be transferred to facility by: Husband      Discharge Plan and Services   Discharge Planning Services: CM Consult                      HH Arranged: PT, OT HH Agency: [facility] Date [facility] Agency Contacted: 05/26/22 Time HH Agency Contacted: [phone] Representative spoke with at [facility] Agency: Tresa Endo  Social Determinants of Health (SDOH) Interventions     Readmission Risk Interventions     No data to display

## 2022-05-27 NOTE — Progress Notes (Signed)
DISCHARGE NOTE HOME [name] to be discharged Home per MD order. Discussed prescriptions and follow up appointments with the patient. Prescriptions given to patient; medication list explained in detail. Patient verbalized understanding.  Skin clean, dry and intact without evidence of skin break down, no evidence of skin tears noted. IV catheter discontinued intact. Site without signs and symptoms of complications. Dressing and pressure applied. Pt denies pain at the site currently. No complaints noted.  Patient free of lines, drains, and wounds.   An After Visit Summary (AVS) was printed and given to the patient. Patient escorted via wheelchair, and discharged home via private auto.  Lorine Bears, RN

## 2022-05-27 NOTE — Discharge Summary (Signed)
Family Medicine Teaching [facility] Discharge Summary  Patient name: Anna Bernard Medical record number: [id number] Date of birth: [birth year] Age: 71 y.o. Gender: female Date of Admission: 05/24/2022  Date of Discharge: 05/27/22 Admitting Physician: Doreene Eland, MD  Primary Care Provider: Billey Co, MD Consultants: None  Indication for Hospitalization: ESBL UTI   Brief Hospital Course:  Anna Bernard is a 71 yo female presents for culture confirmed ESBL UTI.  ESBL UTI: Patient directly admitted by PCP due to need for IV antibiotic therapy to manage her recurrent ESBL UTI. Started on meropenem and monitored for pyelo sx. Patient did not have any fevers or any further symptoms. She was able to transition to Fosfomycin after 3 days of treatment with Meropenem. Patient to finish antibiotic course with a repeat dose of Fosfomycin on 9/11.  HTN: Blood pressure is mildly elevated on admission.  Patient's home olmesartan is nonformulary was substituted with equivalent irbesartan dosing.  Blood pressures largely well controlled during hospitalization.  Medication assistance Patient and family appear to have some unclear understanding of what medications that she was being treated with and what they were for.  Patient also appreciated the schedule that the RN was dosing her medications, a schedule was given that resembled the administration of her medications in the hospital.  Issues for follow-up Ensure completed Fosfomycin dose on 9/11 Discuss with family any concerns regarding medications and consider using pill schedule or pillbox if family has not already using them. Needs appt with [facility]  Needs assistance with hammertoe deformity as the main limitation to her mobility. Has appt with podiatry on 06/02/22 with Dr. Annamary Rummage at [facility] in [place]. Also discharged with home health PT.  Discharge Diagnoses/Problem List:  Principal Problem for  Admission: ESBL UTI  Other Problems addressed during stay:  HTN, Leg weakness, Hammertoe deformity   Disposition: Home with home health  Discharge Condition: Stable   Discharge Exam:  Vitals:   05/27/22 0622 05/27/22 1008  BP: 136/72 129/62  Pulse: 69 75  Resp: 18 17  Temp: 98 F (36.7 C) 97.9 F (36.6 C)  SpO2: 100% 98%   Physical Exam: General: alert and oriented, in no apparent distress. Pleasant and conversational.  Cardiovascular: 4/6 systolic continuous murmur present at aortic and pulmonary ascultation points, RRR, S1 and S2 present. No rubs or gallops.  Respiratory: CTAB, no increased WOB.  Abdomen: Soft, no hepatosplenomegaly, no tenderness to palpation  Extremities: No swelling in right or left lower extremities. Hammar toe deformity of 2nd and 3rd MTP joints, in addition to deformity of right ankle joint.  Psych: Flat affect   Significant Procedures: None   Significant Labs and Imaging:  Recent Labs  Lab 05/27/22 0855  WBC 4.6  HGB 10.4*  HCT 31.0*  PLT 296   Recent Labs  Lab 05/27/22 0855  NA 141  K 3.9  CL 107  CO2 28  GLUCOSE 108*  BUN 14  CREATININE 0.65  CALCIUM 8.8*    No imaging.  Results/Tests Pending at Time of Discharge: None  Discharge Medications:  Allergies as of 05/27/2022   No Known Allergies      Medication List     TAKE these medications    acetaminophen 325 MG tablet Commonly known as: TYLENOL Take 2 tablets (650 mg total) by mouth every 6 (six) hours as needed for mild pain or moderate pain.   apixaban 5 MG Tabs tablet Commonly known as: ELIQUIS Take 1 tablet (  5 mg total) by mouth 2 (two) times daily. Start after treatment course of eliquis completed (10 mg BID eliquis course for 6 additional days) What changed: additional instructions   Carbidopa-Levodopa ER 25-100 MG tablet controlled release Commonly known as: SINEMET CR TAKE 3 TABS AT 7 AM, 2 TABS AT 11AM, 2 TABS AT 4PM What changed:  how much to take how  to take this when to take this additional instructions   diclofenac Sodium 1 % Gel Commonly known as: VOLTAREN APPLY 4 G TOPICALLY 4 TIMES A DAY What changed: See the new instructions.   famotidine 20 MG tablet Commonly known as: Pepcid Take 1 tablet (20 mg total) by mouth daily.   ferrous sulfate 325 (65 FE) MG tablet TAKE 1 TABLET BY MOUTH EVERY DAY WITH BREAKFAST What changed: See the new instructions.   fosfomycin 3 g Pack Commonly known as: MONUROL Take 3 g by mouth once for 1 dose. To be taken on 05/30/2022 Start taking on: May 30, 2022   levothyroxine 100 MCG tablet Commonly known as: SYNTHROID Take 1 tablet (100 mcg total) by mouth every morning. 30 minutes before food What changed:  when to take this additional instructions   lidocaine 5 % Commonly known as: LIDODERM Place 1 patch onto the skin daily. Remove & Discard patch within 12 hours or as directed by MD What changed:  when to take this reasons to take this additional instructions   melatonin 3 MG Tabs tablet Take 3 mg by mouth at bedtime as needed (For sleep).   metoprolol tartrate 25 MG tablet Commonly known as: LOPRESSOR Take 0.5 tablets (12.5 mg total) by mouth 2 (two) times daily. What changed: how much to take   multivitamin with minerals Tabs tablet Take 1 tablet by mouth daily.   olmesartan 20 MG tablet Commonly known as: BENICAR Take 1 tablet (20 mg total) by mouth daily.   polyethylene glycol powder 17 GM/SCOOP powder Commonly known as: GLYCOLAX/MIRALAX Take 17 g by mouth 2 (two) times daily as needed for mild constipation or moderate constipation.   rosuvastatin 10 MG tablet Commonly known as: CRESTOR Take 1 tablet (10 mg total) by mouth daily.   senna 8.6 MG Tabs tablet Commonly known as: SENOKOT Take 2 tablets by mouth daily as needed for mild constipation.        Discharge Instructions: Please refer to Patient Instructions section of EMR for full details.  Patient  was counseled important signs and symptoms that should prompt return to medical care, changes in medications, dietary instructions, activity restrictions, and follow up appointments.   Follow-Up Appointments:  Follow-up Information     Pray, Milus Mallick, MD. Go on 06/03/2022.   Specialty: Family Medicine Why: @1130  Contact information: [address] [place] [phone]         [phone], MD .   Specialty: Cardiology Contact information: [address] [place] [phone]         [facility] Follow up.   Specialty: Home Health Services Why: [phone] will call you to resume care. Contact information: [address] [place] [phone]                 [phone], DO 05/27/2022, 3:52 PM [facility]

## 2022-05-27 NOTE — Progress Notes (Addendum)
Daily Progress Note Intern Pager: [phone]  Patient name: Anna Bernard Medical record number: [id number] Date of birth: [birth year] Age: 71 y.o. Gender: female  Primary Care Provider: Billey Co, MD Consultants: None  Code Status: FULL  Pt Overview and Major Events to Date:  9/6 - admitted   Assessment and Plan: Mrs. Anna Bernard is a 71 yo female with a PMH of HTN, HLD, DM2, anemia, paroxysmal atrial fibrillation, hypothyroidism, Parkinson's disease admitted for symptomatic recurrent ESBL UTI treatment.   * UTI due to extended-spectrum beta lactamase (ESBL) producing Escherichia coli Patient with urinary frequency, nocturia and foul urine odor. WBC 6.0. Urine culture confirmed ESBL E. coli UTI, admitted for IV abx.  - Monitor closely for pyelo sx - Completed Meropenem IV (Day until lunchtime today, send her out with fosfomycin PO dose and then another in 3 days (8/11)  Essential hypertension BP 158/77 on admission.Currently stable. - Monitor with routine vitals - Irbesartan 150mg  daily  Paroxysmal atrial fibrillation (HCC) Chronic - On Eliquis 5 mg BID  - On Lopressor 12.5 mg   Parkinson disease (HCC) Has led to loss of strength/motor function in right lower leg.  - On carbidopa/levodopa  - PT/OT consult, recommended skilled nursing-short term rehab, patient and family declined and would like home health PT    Hammertoe of second toe of right foot Patient describes this as the main limitation to her mobility.  - Patient has a scheduled appointment on 06/02/22 with Dr. 06/04/22 at [facility] in [place].  Murmur, cardiac 4/6 systolic continuous murmur present. Most recent Echo on 01/17/22 could not exclude small PFO, but noted no aortic stenosis or mitral regurgitation.  - Asymptomatic, consider repeat Echo if develops chest pain, SOB, presyncope, etc.    FEN/GI: Normal diet  PPx: Eliquis  Dispo:Home today.   Subjective:  Patient was seen  at bedside, her husband was sitting beside her. They are happy about her going home today, but expressed confusion about a few things. I explained the fosfomycin copay being $70 which they said would be fine, but they asked if it was for her foot and I clarified that it's for her UTI. They also asked if they could have a medication agenda written out for her because being here and seeing the medication regimen helped her realize she has not been taking them on the right schedule. In addition, although they declined SNF, they would like home health for her foot rehab. They are meeting with podiatry on the 14th and we discussed the questions she should ask them. I also explained that she should have close follow up with Dr. 15. She denies chest pain, palpitations, SOB, abdominal pain.   Objective: Temp:  [97.9 F (36.6 C)-98.1 F (36.7 C)] 97.9 F (36.6 C) (09/08 1008) Pulse Rate:  [67-75] 75 (09/08 1008) Resp:  [17-18] 17 (09/08 1008) BP: (129-167)/(62-76) 129/62 (09/08 1008) SpO2:  [98 %-100 %] 98 % (09/08 1008) Physical Exam: General: alert and oriented, in no apparent distress. Pleasant and conversational.  Cardiovascular: 4/6 systolic continuous murmur present at aortic and pulmonary ascultation points, RRR, S1 and S2 present. No rubs or gallops.  Respiratory: CTAB, no increased WOB.  Abdomen: Soft, no hepatosplenomegaly, no tenderness to palpation  Extremities: No swelling in right or left lower extremities. Hammar toe deformity of 2nd and 3rd MTP joints, in addition to deformity of right ankle joint.  Psych: Flat affect   Laboratory: Most recent CBC Lab Results  Component Value Date   WBC 4.6 05/27/2022   HGB 10.4 (L) 05/27/2022   HCT 31.0 (L) 05/27/2022   MCV 75.4 (L) 05/27/2022   PLT 296 05/27/2022   Most recent BMP    Latest Ref Rng & Units 05/27/2022    8:55 AM  BMP  Glucose 70 - 99 mg/dL 829   BUN 8 - 23 mg/dL 14   Creatinine 5.62 - 1.00 mg/dL 1.30   Sodium 865 - 784  mmol/L 141   Potassium 3.5 - 5.1 mmol/L 3.9   Chloride 98 - 111 mmol/L 107   CO2 22 - 32 mmol/L 28   Calcium 8.9 - 10.3 mg/dL 8.8    None new   Ellison Hughs, Medical Student 05/27/2022, 12:15 PM [facility] FPTS Intern pager: [phone], text pages welcome Secure chat group [facility] Teaching Service    Resident attestation: I agree with the documentation of Student Dr. Nicholas Lose above. I have made adjustments to her note as appropriate. I have seen the patient and performed physical exam on the patient consistent with her documented physical exam above.  Alfredo Martinez, MD

## 2022-05-27 NOTE — Discharge Instructions (Addendum)
Dear Anna Bernard,   Thank you so much for allowing [place] to be part of your care!  You were admitted to [facility] for urinary tract infection that we treated with IV antibiotics.   We will give you a dose of fosfomycin today 9/8 before you leave and you need to take a dose of fosfomycin on 9/11 again at home. I have sent that medication to your pharmacy    POST-HOSPITAL & CARE INSTRUCTIONS Follow up with Dr. Miquel Dunn on Sept 15 @ 1130  Please let PCP/Specialists know of any changes that were made.  Please see medications section of this packet for any medication changes.   DOCTOR'S APPOINTMENT & FOLLOW UP CARE INSTRUCTIONS  Future Appointments  Date Time Provider Department Center  06/02/2022  1:30 PM Pilar Plate, DPM [facility]  06/03/2022 11:30 AM Pray, Milus Mallick, MD [facility]  08/16/2022 10:45 AM Tat, Octaviano Batty, DO [facility] None  11/16/2022  1:00 PM Quintella Reichert, MD [facility]    RETURN PRECAUTIONS:  Return for worsening urinary symptoms, chills, fever, changes in mental status   Take care and be well!  Family Medicine Teaching Service  [place]  [facility]  [address] [place], [place] [address] [phone]  Medication List and Times  6:00am   Levothyroxine (Synthroid)   7:00am   Carbidopa-Levodopa (Sinemet) (3 tablets)   10:00am   Apixaban (Eliquis)   Famotidine (Pepcid)   Olmesartan (Benicar)   Metoprolol (Lopressor)   Rosuvastatin (Crestor)   Senna (Senokot) (over the counter)   11:00am   Carbidopa-Levodopa (Sinemet) (2 tablets)   4:00pm   Carbidopa-Levodopa (Sinemet) (2 tablets)   10:00pm   Apixaban (Eliquis)   Metoprolol (Lopressor)   Melatonin (over the counter)

## 2022-05-27 NOTE — Progress Notes (Signed)
Occupational Therapy Treatment Patient Details Name: Anna Bernard MRN: [id number] DOB: 07-Aug-1951 Today's Date: 05/27/2022   History of present illness Pt is a 71 y/o female admitted secondary to ESBL UTI. PMH includes HTN, DM, a fib, and parkinsons.   OT comments  Pt. Seen for skilled OT treatment session.  Husband Anna Bernard present and very supportive and active in pts. Session.  Bed mobility with mod a.  Attempts at scooting but pt. Unable secondary to reported fatigue.  Reviewed log roll tech. For in/out of be for pt. Comfort and safety vs. Her current reported method at home.  Cont. To progress adls next session as pt. Able.     Recommendations for follow up therapy are one component of a multi-disciplinary discharge planning process, led by the attending physician.  Recommendations may be updated based on patient status, additional functional criteria and insurance authorization.    Follow Up Recommendations  Skilled nursing-short term rehab (<3 hours/day)    Assistance Recommended at Discharge Frequent or constant Supervision/Assistance  Patient can return home with the following  Two people to help with walking and/or transfers;A lot of help with bathing/dressing/bathroom;Assistance with cooking/housework;Direct supervision/assist for medications management;Direct supervision/assist for financial management;Assist for transportation;Help with stairs or ramp for entrance   Equipment Recommendations       Recommendations for Other Services      Precautions / Restrictions Precautions Precautions: Fall       Mobility Bed Mobility Overal bed mobility: Needs Assistance Bed Mobility: Rolling, Sit to Sidelying, Sidelying to Sit Rolling: Min guard Sidelying to sit: Mod assist     Sit to sidelying: Mod assist General bed mobility comments: pt. able to reach for bed rails and initiate roll to side of bed and bring b les off of bed.  mod a to guide trunk upright.  seated un supported  eob. attempted scoots towards hob pt. states "im too tired" requests back to bed.  mod a for les back to bed and to guide trunk down.  educated on benefits of log roll in/out of bed. husband present and states pt. uses a small stool and gets into bed forward. reviewed safety risks he agreed and said "honey i think were gonna try the log thing"    Transfers                         Balance                                           ADL either performed or assessed with clinical judgement   ADL                                         General ADL Comments: bed mobility in preparation for increasing strength for transfers and adls    Extremity/Trunk Assessment              Vision       Perception     Praxis      Cognition Arousal/Alertness: Lethargic Behavior During Therapy: Flat affect Overall Cognitive Status: Within Functional Limits for tasks assessed  Exercises      Shoulder Instructions       General Comments      Pertinent Vitals/ Pain       Pain Assessment Pain Assessment: No/denies pain  Home Living                                          Prior Functioning/Environment              Frequency  Min 2X/week        Progress Toward Goals  OT Goals(current goals can now be found in the care plan section)  Progress towards OT goals: Progressing toward goals     Plan      Co-evaluation                 AM-PAC OT "6 Clicks" Daily Activity     Outcome Measure   Help from another person eating meals?: A Little Help from another person taking care of personal grooming?: A Little Help from another person toileting, which includes using toliet, bedpan, or urinal?: A Lot Help from another person bathing (including washing, rinsing, drying)?: A Lot Help from another person to put on and taking off regular upper body  clothing?: A Little Help from another person to put on and taking off regular lower body clothing?: A Lot 6 Click Score: 15    End of Session    OT Visit Diagnosis: Unsteadiness on feet (R26.81);Other abnormalities of gait and mobility (R26.89);Muscle weakness (generalized) (M62.81);Other symptoms and signs involving cognitive function;Pain Pain - Right/Left: Right Pain - part of body: Ankle and joints of foot   Activity Tolerance Patient limited by fatigue   Patient Left in bed;with call bell/phone within reach;with bed alarm set;with family/visitor present   Nurse Communication          Time: 3254-9826 OT Time Calculation (min): 17 min  Charges: OT General Charges $OT Visit: 1 Visit OT Treatments $Self Care/Home Management : 8-22 mins  Anna Bernard, COTA/L Acute Rehabilitation [phone]   Anna Bernard-COTA/L 05/27/2022, 12:35 PM

## 2022-05-27 NOTE — Progress Notes (Signed)
Physical Therapy Treatment Patient Details Name: Anna Bernard MRN: [id number] DOB: Aug 27, 1951 Today's Date: 05/27/2022   History of Present Illness Pt is a 71 y/o female admitted secondary to ESBL UTI. PMH includes HTN, DM, a fib, and parkinsons.    PT Comments    Pt reluctant agreeable to mobility. Question of if pt could transfer into car for dc home. Worked on bed to chair to bed to somewhat simulate transfer. Pt able to complete with 2 people min assist and likely one person could assist in her normal/familiar setting. Husband present at end of session and confirms he assist her into car all the time. Pt and family did not want SNF and are going to continue to assist her at home. Expect she will do better with mobility in her familiar environment.    Recommendations for follow up therapy are one component of a multi-disciplinary discharge planning process, led by the attending physician.  Recommendations may be updated based on patient status, additional functional criteria and insurance authorization.  Follow Up Recommendations  Home health PT     Assistance Recommended at Discharge Frequent or constant Supervision/Assistance  Patient can return home with the following A lot of help with walking and/or transfers;Help with stairs or ramp for entrance;A lot of help with bathing/dressing/bathroom   Equipment Recommendations  None recommended by PT    Recommendations for Other Services       Precautions / Restrictions Precautions Precautions: Fall     Mobility  Bed Mobility Overal bed mobility: Needs Assistance Bed Mobility: Supine to Sit, Sit to Supine     Supine to sit: Min assist Sit to supine: +2 for physical assistance, Mod assist   General bed mobility comments: Assist for pt to pull trunk up into sitting and to bring hips to EOB. More assist returning to supine due to pt too short to sit far back on bed.    Transfers Overall transfer level: Needs  assistance Equipment used: 2 person hand held assist Transfers: Sit to/from Stand, Bed to chair/wheelchair/BSC Sit to Stand: +2 physical assistance, Min assist   Step pivot transfers: +2 physical assistance, Min assist       General transfer comment: Pt used UE's of therapist to stabilize to stand and take pivotal steps to chair and the repeated back from chair to bed.    Ambulation/Gait                   Stairs             Wheelchair Mobility    Modified Rankin (Stroke Patients Only)       Balance Overall balance assessment: Needs assistance Sitting-balance support: No upper extremity supported, Feet supported Sitting balance-Leahy Scale: Fair     Standing balance support: Bilateral upper extremity supported Standing balance-Leahy Scale: Poor Standing balance comment: UE support and min assist                            Cognition Arousal/Alertness: Awake/alert Behavior During Therapy:  (irritated) Overall Cognitive Status: Impaired/Different from baseline Area of Impairment: Memory                     Memory: Decreased short-term memory         General Comments: Pt asking why no one has been working with her until the day she goes home. She was seen both of the prior days by therapy. Unsure if  true memory issues or if more selective memory.        Exercises      General Comments        Pertinent Vitals/Pain Pain Assessment Pain Assessment: No/denies pain    Home Living                          Prior Function            PT Goals (current goals can now be found in the care plan section) Acute Rehab PT Goals Patient Stated Goal: to go home Progress towards PT goals: Progressing toward goals    Frequency    Min 3X/week      PT Plan Discharge plan needs to be updated    Co-evaluation              AM-PAC PT "6 Clicks" Mobility   Outcome Measure  Help needed turning from your back to  your side while in a flat bed without using bedrails?: A Little Help needed moving from lying on your back to sitting on the side of a flat bed without using bedrails?: A Little Help needed moving to and from a bed to a chair (including a wheelchair)?: A Lot Help needed standing up from a chair using your arms (e.g., wheelchair or bedside chair)?: A Lot Help needed to walk in hospital room?: Total Help needed climbing 3-5 steps with a railing? : Total 6 Click Score: 12    End of Session   Activity Tolerance: Patient tolerated treatment well Patient left: in bed;with call bell/phone within reach;with family/visitor present Nurse Communication: Mobility status;Other (comment) (pt requesting bath) PT Visit Diagnosis: Unsteadiness on feet (R26.81);Muscle weakness (generalized) (M62.81);Difficulty in walking, not elsewhere classified (R26.2)     Time: 3149-7026 PT Time Calculation (min) (ACUTE ONLY): 15 min  Charges:  $Therapeutic Activity: 8-22 mins                     [facility] PT Acute Rehabilitation Services Office [phone]    Angelina Ok [facility] 05/27/2022, 4:20 PM

## 2022-05-30 NOTE — Progress Notes (Signed)
    SUBJECTIVE:   CHIEF COMPLAINT / HPI:   Hospital f/u - she notes feeling well land more like herself since leaving the hospital. Took the last fosphomycin dose on 9/11 and no dysuria, fevers, or chills.   Nocturia- still present regularly. Has not made urology follow up appt yet, hasn't seen in years.  Hammer toe deformity- saw podiatry on 06/02/22 with Dr. Annamary Rummage at [facility] in [place]. Making a specialized shoe for her. Is a little disappointed that she can't have surgery.   Leg swelling/venous insufficiency- open to compression stockings.   OBJECTIVE:   BP 127/69   Pulse 74   Ht 5\' 2"  (1.575 m)   SpO2 100%   BMI 28.27 kg/m   General: alert & oriented, no apparent distress, well groomed HEENT: normocephalic, atraumatic, EOM grossly intact, oral mucosa moist, neck supple Respiratory: normal respiratory effort GI: non-distended Skin: no rashes, no jaundice Psych: appropriate mood and affect   ASSESSMENT/PLAN:   Chronic venous insufficiency Discussed compression stockings, she will try these  Hammertoe of second toe of right foot Specialized shoe being made with podiatry, encouraged her to try this as it would be great if she could avoid surgery  Nocturia Still present, urology referral placed  UTI due to extended-spectrum beta lactamase (ESBL) producing Escherichia coli S/p hospitalization and antibiotics treatment Urology referral as above Feeling well, no signs/symptoms of infection     , MD [facility]

## 2022-06-02 ENCOUNTER — Encounter: Payer: Self-pay | Admitting: Podiatry

## 2022-06-02 ENCOUNTER — Ambulatory Visit (INDEPENDENT_AMBULATORY_CARE_PROVIDER_SITE_OTHER): Payer: Medicare Other

## 2022-06-02 ENCOUNTER — Ambulatory Visit: Payer: Medicare Other | Admitting: Podiatry

## 2022-06-02 DIAGNOSIS — E119 Type 2 diabetes mellitus without complications: Secondary | ICD-10-CM

## 2022-06-02 DIAGNOSIS — M205X1 Other deformities of toe(s) (acquired), right foot: Secondary | ICD-10-CM

## 2022-06-02 DIAGNOSIS — M2041 Other hammer toe(s) (acquired), right foot: Secondary | ICD-10-CM

## 2022-06-02 DIAGNOSIS — R600 Localized edema: Secondary | ICD-10-CM | POA: Diagnosis not present

## 2022-06-02 DIAGNOSIS — M205X2 Other deformities of toe(s) (acquired), left foot: Secondary | ICD-10-CM

## 2022-06-02 NOTE — Progress Notes (Signed)
Subjective:  Patient ID: Anna Bernard, female    DOB: 09-20-1950,  MRN: [id number]  Chief Complaint  Patient presents with   Anna Bernard    Patient is here for hammertoe on right foot.    71 y.o. female presents with painful deformed toes on the right foot.  She is on the right much history and presents with a family member at this visit.  Per his report he she is unable to ambulate at this time and is in a wheelchair related to her right foot.  She has significant swelling which has been present for weeks.  She also has deformity of her toes unclear when or how that started.  These toes are causing her significant pain and she is not able to weight-bear on the right foot related to the deformity.  She does have a history of diabetes as well as Parkinson's disease.  She has previously seen Dr. Stacie Acres in the past for routine foot care  Past Medical History:  Diagnosis Date   Anemia, unspecified    Carotid artery disease (HCC)    Hyperlipidemia, unspecified    Hypertension    Hypothyroidism, unspecified    Mild CAD    PAF (paroxysmal atrial fibrillation) (HCC)    Pre-diabetes    Pulmonary emboli (HCC)    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 by duplex 2023   Vitamin B12 deficiency     No Known Allergies  ROS: Negative except as per HPI above  Objective:  General: AAO x3, NAD  Dermatological: With inspection and palpation of the right and left lower extremities there are no open sores, no preulcerative lesions, no rash or signs of infection present. Nails are of normal length, onychomycosis is present  Vascular:  Dorsalis Pedis artery and Posterior Tibial artery pedal pulses are 2/4 bilateral.  Capillary fill time brisk < 3 sec. significant right lower extremity pitting edema present  Neruologic: Grossly intact via light touch bilateral. Protective threshold intact to all sites bilateral. Patellar and Achilles deep tendon reflexes 2+ bilateral. Negative Babinski  reflex.   Musculoskeletal: attention to the lesser toes of the right foot there are significant claw toe deformity present in digits 2 through 5.  The digits are tender with palpation.   there is contracture both at the proximal and distal interphalangeal joints of toes 2 through 5.  Semirigid deformity.  Gait: Unassisted, Nonantalgic.   No images are attached to the encounter.  Radiographs:  Date: 06/02/2022 XR the right foot Weightbearing AP/Lateral/Oblique   Findings: moderate to severe digital contractures of the lesser digits of the right foot.  Assessment:   1. Hammer toe of right foot   2. Acquired claw toe of right foot   3. Claw toe, left   4. Bilateral lower extremity edema   5. Controlled type 2 diabetes mellitus without complication, without long-term current use of insulin (HCC)      Plan:  Patient was evaluated and treated and all questions answered.  #Claw toe of lesser digits of right foot -I discussed with the patient and her family member present that her pain is related to digital contracture deformity of the lesser toes 2 through 5 on the right foot.  Explained that conservatively what can be done would include extra-depth shoes offloading custom inserts gels spacers and pads. -Surgically there would be consideration for arthroplasty or arthrodesis of the interphalangeal joints of the lesser toes however I feel this would be a large surgery  for her and not well-tolerated in addition to the fact that she is not a good surgical surgical candidate overall.  She has multiple risk factors and comorbidities that would make surgery unacceptably risky for her. -At this point time I would like to proceed with a pair of diabetic shoes and custom liners for the patient.  She is agreeable with this plan and would like to proceed with diabetic shoes and liners at this time.  We will cast the patient for these today and they will be dispensed when available. -Additionally I think  would be of benefit for the patient to follow-up with the vein and vascular surgery specialists for evaluation of her chronic right lower extremity edema.  She would likely benefit from compression therapy to the right lower extremity.  Patient will follow-up in 3 months for recheck after having been able to use the diabetic shoes and liners for an appropriate amount of time.         Corinna Gab, DPM [facility] / [facility]

## 2022-06-03 ENCOUNTER — Encounter: Payer: Self-pay | Admitting: Family Medicine

## 2022-06-03 ENCOUNTER — Ambulatory Visit: Payer: Medicare Other | Admitting: Family Medicine

## 2022-06-03 ENCOUNTER — Telehealth: Payer: Self-pay | Admitting: Family Medicine

## 2022-06-03 VITALS — BP 127/69 | HR 74 | Ht 62.0 in

## 2022-06-03 DIAGNOSIS — R351 Nocturia: Secondary | ICD-10-CM

## 2022-06-03 DIAGNOSIS — E119 Type 2 diabetes mellitus without complications: Secondary | ICD-10-CM

## 2022-06-03 DIAGNOSIS — Z1612 Extended spectrum beta lactamase (ESBL) resistance: Secondary | ICD-10-CM | POA: Diagnosis not present

## 2022-06-03 DIAGNOSIS — M2041 Other hammer toe(s) (acquired), right foot: Secondary | ICD-10-CM

## 2022-06-03 DIAGNOSIS — I872 Venous insufficiency (chronic) (peripheral): Secondary | ICD-10-CM | POA: Diagnosis not present

## 2022-06-03 DIAGNOSIS — N390 Urinary tract infection, site not specified: Secondary | ICD-10-CM | POA: Diagnosis not present

## 2022-06-03 DIAGNOSIS — B9629 Other Escherichia coli [E. coli] as the cause of diseases classified elsewhere: Secondary | ICD-10-CM

## 2022-06-03 LAB — POCT GLYCOSYLATED HEMOGLOBIN (HGB A1C): HbA1c, POC (controlled diabetic range): 5.2 % (ref 0.0–7.0)

## 2022-06-03 NOTE — Assessment & Plan Note (Signed)
Specialized shoe being made with podiatry, encouraged her to try this as it would be great if she could avoid surgery

## 2022-06-03 NOTE — Assessment & Plan Note (Signed)
S/p hospitalization and antibiotics treatment Urology referral as above Feeling well, no signs/symptoms of infection

## 2022-06-03 NOTE — Telephone Encounter (Signed)
Clinical info completed on FMLA form.  Placed form in Dr Berneta Levins box for completion.    When form is completed, please route note to "RN Team" and place in wall pocket in front office.   Sunday Spillers, CMA

## 2022-06-03 NOTE — Telephone Encounter (Signed)
Patient's daughter dropped off FMLA paperwork to be completed. Last DOS was 06/03/22. Placed in [employer].

## 2022-06-03 NOTE — Assessment & Plan Note (Signed)
Discussed compression stockings, she will try these

## 2022-06-03 NOTE — Patient Instructions (Signed)
It was wonderful to see you today.  Please bring ALL of your medications with you to every visit.   Today we talked about:  - I have placed a referral to the urologist- they should call you in two weeks.  You can get compression stockings for your legs.  Follow up in 2 months.    Thank you for choosing [facility].   Please call [phone] with any questions about today's appointment.  Please be sure to schedule follow up at the front  desk before you leave today.   Please arrive at least 15 minutes prior to your scheduled appointments.   If you had blood work today, I will send you a MyChart message or a letter if results are normal. Otherwise, I will give you a call.   If you had a referral placed, they will call you to set up an appointment. Please give [place] a call if you don't hear back in the next 2 weeks.   If you need additional refills before your next appointment, please call your pharmacy first.   Burley Saver, MD  Family Medicine

## 2022-06-03 NOTE — Assessment & Plan Note (Signed)
Still present, urology referral placed

## 2022-06-09 NOTE — Telephone Encounter (Signed)
Form placed up front for pick up.   Copy made for batch scanning.   Patient aware.  

## 2022-06-24 DIAGNOSIS — H34831 Tributary (branch) retinal vein occlusion, right eye, with macular edema: Secondary | ICD-10-CM | POA: Diagnosis not present

## 2022-06-25 ENCOUNTER — Other Ambulatory Visit: Payer: Self-pay | Admitting: Family Medicine

## 2022-06-25 DIAGNOSIS — E039 Hypothyroidism, unspecified: Secondary | ICD-10-CM

## 2022-06-27 ENCOUNTER — Other Ambulatory Visit: Payer: Self-pay | Admitting: *Deleted

## 2022-06-27 DIAGNOSIS — I872 Venous insufficiency (chronic) (peripheral): Secondary | ICD-10-CM

## 2022-07-01 ENCOUNTER — Other Ambulatory Visit: Payer: Self-pay

## 2022-07-01 ENCOUNTER — Other Ambulatory Visit (HOSPITAL_COMMUNITY): Payer: Self-pay

## 2022-07-02 ENCOUNTER — Other Ambulatory Visit (HOSPITAL_COMMUNITY): Payer: Self-pay

## 2022-07-05 ENCOUNTER — Ambulatory Visit (HOSPITAL_COMMUNITY)
Admission: RE | Admit: 2022-07-05 | Discharge: 2022-07-05 | Disposition: A | Discharge status: Home / Self Care | Payer: Medicare Other | Source: Ambulatory Visit | Attending: Vascular Surgery | Admitting: Vascular Surgery

## 2022-07-05 ENCOUNTER — Ambulatory Visit: Payer: Medicare Other | Admitting: Physician Assistant

## 2022-07-05 VITALS — BP 165/89 | HR 84 | Temp 98.6°F | Resp 20 | Ht 62.0 in

## 2022-07-05 DIAGNOSIS — I872 Venous insufficiency (chronic) (peripheral): Secondary | ICD-10-CM

## 2022-07-05 NOTE — Progress Notes (Signed)
Office Note     CC:  follow up Requesting Provider:  Yevonne Pax*  HPI: Anna Bernard is a 71 y.o. ([birth year]) female who presents for evaluation of right leg edema.  She was referred by her podiatrist.  Patient states she is not bothered by the edema of her right leg.  She has tried compression in the past however states the compression socks are too painful.  She does not make an effort to elevate her legs during the day.  Her husband is present during today's visit and states she is only minimally mobile at home.  She can walk limited distance with a walker however is prone to falls.  Patient states she is bothered by the pain caused by her hammertoes.  She denies claudication, rest pain, or wounds of her right leg.  She also denies history of DVT, venous ulcerations, trauma, or prior vascular interventions.  Past Medical History:  Diagnosis Date   Anemia, unspecified    Carotid artery disease ([place])    Hyperlipidemia, unspecified    Hypertension    Hypothyroidism, unspecified    Mild CAD    PAF (paroxysmal atrial fibrillation) (HCC)    Pre-diabetes    Pulmonary emboli (HCC)    Stenosis of right subclavian artery ([place])    possible by duplex 2023   Vitamin B12 deficiency     Past Surgical History:  Procedure Laterality Date   ABDOMINAL HYSTERECTOMY     GIVENS CAPSULE STUDY N/A 04/16/2018   Procedure: GIVENS CAPSULE STUDY;  Surgeon: Carol Ada, MD;  Location: [place];  Service: Endoscopy;  Laterality: N/A;   PARS PLANA VITRECTOMY Right 08/16/2020   Procedure: 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;  Surgeon: Jalene Mullet, MD;  Location: [place];  Service: Ophthalmology;  Laterality: Right;   PARS PLANA VITRECTOMY Right 09/29/2020   Procedure: PARS PLANA VITRECTOMY WITH 25 GAUGE - ENDOCAUTRY;  Surgeon: Jalene Mullet, MD;  Location: [employer];  Service: Ophthalmology;  Laterality: Right;   PHOTOCOAGULATION WITH LASER Right  08/16/2020   Procedure: PHOTOCOAGULATION WITH LASER;  Surgeon: Jalene Mullet, MD;  Location: [place];  Service: Ophthalmology;  Laterality: Right;   PHOTOCOAGULATION WITH LASER Right 09/29/2020   Procedure: PHOTOCOAGULATION WITH LASER;  Surgeon: Jalene Mullet, MD;  Location: [place];  Service: Ophthalmology;  Laterality: Right;   SHOULDER SURGERY     TONSILLECTOMY     TUBAL LIGATION      Social History   Socioeconomic History   Marital status: Married    Spouse name: Not on file   Number of children: Not on file   Years of education: Not on file   Highest education level: Not on file  Occupational History   Not on file  Tobacco Use   Smoking status: Former    Passive exposure: Past   Smokeless tobacco: Never   Tobacco comments:    quit 30+ years ago  Vaping Use   Vaping Use: Never used  Substance and Sexual Activity   Alcohol use: No    Alcohol/week: 0.0 standard drinks of alcohol   Drug use: No   Sexual activity: Not Currently  Other Topics Concern   Not on file  Social History Narrative   Right handed   Lives with husband in a one story home.   Caffeine once in a while   Social Determinants of Health   Financial Resource Strain: Not on file  Food Insecurity: Not on file  Transportation Needs: Not  on file  Physical Activity: Not on file  Stress: Not on file  Social Connections: Not on file  Intimate Partner Violence: Not on file    Family History  Problem Relation Age of Onset   Renal Disease Sister    Cancer Sister     Current Outpatient Medications  Medication Sig Dispense Refill   acetaminophen (TYLENOL) 325 MG tablet Take 2 tablets (650 mg total) by mouth every 6 (six) hours as needed for mild pain or moderate pain.     apixaban (ELIQUIS) 5 MG TABS tablet Take 1 tablet (5 mg total) by mouth 2 (two) times daily. Start after treatment course of eliquis completed (10 mg BID eliquis course for 6 additional days) (Patient taking differently: Take 5 mg by  mouth 2 (two) times daily.) 60 tablet 6   Carbidopa-Levodopa ER (SINEMET CR) 25-100 MG tablet controlled release TAKE 3 TABS AT 7 AM, 2 TABS AT 11AM, 2 TABS AT 4PM (Patient taking differently: Take 2-3 tablets by mouth See admin instructions. Take 3 tablets by mouth at 7 AM, 2 tablets by mouth at 11AM, 2 tablets by mouth at 4PM, per daughter) 630 tablet 1   diclofenac Sodium (VOLTAREN) 1 % GEL APPLY 4 G TOPICALLY 4 TIMES A DAY (Patient taking differently: Apply 2 g topically 4 (four) times daily.) 300 g 1   famotidine (PEPCID) 20 MG tablet Take 1 tablet (20 mg total) by mouth daily. 60 tablet 2   ferrous sulfate 325 (65 FE) MG tablet TAKE 1 TABLET BY MOUTH EVERY DAY WITH BREAKFAST (Patient taking differently: Take 325 mg by mouth daily with breakfast.) 90 tablet 1   levothyroxine (SYNTHROID) 100 MCG tablet TAKE 1 TABLET BY MOUTH EVERY MORNING 30 MINUTES BEFORE FOOD 90 tablet 1   lidocaine (LIDODERM) 5 % Place 1 patch onto the skin daily. Remove & Discard patch within 12 hours or as directed by MD (Patient taking differently: Place 1 patch onto the skin daily as needed (For pain).) 5 patch 0   melatonin 3 MG TABS tablet Take 3 mg by mouth at bedtime as needed (For sleep).     metoprolol tartrate (LOPRESSOR) 25 MG tablet Take 0.5 tablets (12.5 mg total) by mouth 2 (two) times daily. 30 tablet 0   Multiple Vitamin (MULTIVITAMIN WITH MINERALS) TABS Take 1 tablet by mouth daily.     olmesartan (BENICAR) 20 MG tablet Take 1 tablet (20 mg total) by mouth daily. 90 tablet 3   polyethylene glycol powder (GLYCOLAX/MIRALAX) 17 GM/SCOOP powder Take 17 g by mouth 2 (two) times daily as needed for mild constipation or moderate constipation. 3350 g 3   rosuvastatin (CRESTOR) 10 MG tablet Take 1 tablet (10 mg total) by mouth daily. 90 tablet 1   senna (SENOKOT) 8.6 MG TABS tablet Take 2 tablets by mouth daily as needed for mild constipation.     No current facility-administered medications for this visit.    No  Known Allergies   REVIEW OF SYSTEMS:   [X]  denotes positive finding, [ ]  denotes negative finding Cardiac  Comments:  Chest pain or chest pressure:    Shortness of breath upon exertion:    Short of breath when lying flat:    Irregular heart rhythm:        Vascular    Pain in calf, thigh, or hip brought on by ambulation:    Pain in feet at night that wakes you up from your sleep:     Blood clot in your  veins:    Leg swelling:         Pulmonary    Oxygen at home:    Productive cough:     Wheezing:         Neurologic    Sudden weakness in arms or legs:     Sudden numbness in arms or legs:     Sudden onset of difficulty speaking or slurred speech:    Temporary loss of vision in one eye:     Problems with dizziness:         Gastrointestinal    Blood in stool:     Vomited blood:         Genitourinary    Burning when urinating:     Blood in urine:        Psychiatric    Major depression:         Hematologic    Bleeding problems:    Problems with blood clotting too easily:        Skin    Rashes or ulcers:        Constitutional    Fever or chills:      PHYSICAL EXAMINATION:  Vitals:   07/05/22 0921  BP: (!) 165/89  Pulse: 84  Resp: 20  Temp: 98.6 F (37 C)  TempSrc: Temporal  SpO2: 99%  Height: 5\' 2"  (1.575 m)    General:  WDWN in NAD; vital signs documented above Gait: Not observed HENT: WNL, normocephalic Pulmonary: normal non-labored breathing , without Rales, rhonchi,  wheezing Cardiac: regular HR Abdomen: soft, NT, no masses Skin: without rashes Vascular Exam/Pulses:  Right Left  Radial 2+ (normal) 2+ (normal)  DP 2+ (normal) 2+ (normal)  PT 2+ (normal) absent   Extremities: Edematous right lower leg to the midshin; pigmentation changes; no varicosities or venous ulcerations Musculoskeletal: no muscle wasting or atrophy  Neurologic: A&O X 3;  No focal weakness or paresthesias are detected Psychiatric:  The pt has Normal  affect.   Non-Invasive Vascular Imaging:   Right lower extremity venous reflux study negative for DVT Incompetent common femoral vein Incompetent GSV at the saphenofemoral junction only    ASSESSMENT/PLAN:: 71 y.o. female here for evaluation of right leg edema  -Right leg well-perfused with palpable DP and PT pulses -Right lower extremity venous reflux study negative for DVT.  Only mild deep and superficial reflux noted on duplex.  Edema is likely a result of immobility.  Recommended knee-high compression to be worn regularly during the day.  Also recommended elevation of the leg above the level of the heart when resting.  She also needs to avoid prolonged sitting to encourage venous return. -Nothing further to add from a vascular surgery standpoint.  She will be referred back to podiatry for further discussions about conservative versus surgical management of the hammertoes.   Dagoberto Ligas, PA-C [facility] [phone]  Clinic MD:   Carlis Abbott

## 2022-07-20 DIAGNOSIS — H31091 Other chorioretinal scars, right eye: Secondary | ICD-10-CM | POA: Diagnosis not present

## 2022-07-20 DIAGNOSIS — H34831 Tributary (branch) retinal vein occlusion, right eye, with macular edema: Secondary | ICD-10-CM | POA: Diagnosis not present

## 2022-07-20 DIAGNOSIS — H3582 Retinal ischemia: Secondary | ICD-10-CM | POA: Diagnosis not present

## 2022-07-20 DIAGNOSIS — H25811 Combined forms of age-related cataract, right eye: Secondary | ICD-10-CM | POA: Diagnosis not present

## 2022-07-20 DIAGNOSIS — H35371 Puckering of macula, right eye: Secondary | ICD-10-CM | POA: Diagnosis not present

## 2022-07-22 ENCOUNTER — Other Ambulatory Visit: Payer: Self-pay | Admitting: Student

## 2022-07-25 ENCOUNTER — Other Ambulatory Visit: Payer: Self-pay

## 2022-07-25 MED ORDER — METOPROLOL TARTRATE 25 MG PO TABS
12.5000 mg | ORAL_TABLET | Freq: Two times a day (BID) | ORAL | 0 refills | Status: DC
  Filled 2022-07-25: qty 30, 30d supply, fill #0

## 2022-08-03 DIAGNOSIS — R350 Frequency of micturition: Secondary | ICD-10-CM | POA: Diagnosis not present

## 2022-08-03 DIAGNOSIS — N390 Urinary tract infection, site not specified: Secondary | ICD-10-CM | POA: Diagnosis not present

## 2022-08-09 NOTE — Progress Notes (Signed)
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  This did not show efficacy of levodopa.  However, when I took the patient off of levodopa for 24 hours prior to seeing her , in anticipation for levodopa challenge test, the patient and her husband reported that she did significantly worse off of the medication.  They therefore have opted to stay on the medication.             -continue carbidopa/levodopa CR, 3 at 7am, 2 at 11 am and 2 at 4pm.  her 11am dosage was given in the office, at husband request             -We discussed differences between vascular parkinsonism and idiopathic Parkinson's disease.  She expressed understanding.  -pt not doing as well since urosepsis.  They had PT, but husband states that they never came consistently, which was difficult to schedule with his chemotherapy if they were not going to, on a consistent basis at a consistent time.  Therefore, they had to stop them from coming.  He asks me for patient literature and information on exercises he can do with the patient and those were provided today.     2.  Atrial fibrillation             -Patient spontaneously converted back to sinus rhythm, but had syncopal episode associated with dizziness when she had RVR.             -Patient now on Eliquis    Subjective:   Anna Bernard was seen today in follow up for Parkinsons disease.  My previous records were reviewed prior to todays visit as well as outside records available to me.  Patient accompanied by husband who supplements the history.  Patient was admitted in September for antibiotics for UTI.  Husband states that she just took her last abx today as she has had persistent UTI and sx's.  No hallucinations.  She has not had falls.  Husband asks if we can give her some levodopa while in the office as they forgot it at home and would like to get it before she gets home.    Current prescribed movement  disorder medications: Carbidopa/levodopa 25/100 CR, 3 tablets at 7 AM/2 tablets at 11 AM/2 at 4pm  PREVIOUS MEDICATIONS: Sinemet  ALLERGIES:  No Known Allergies  CURRENT MEDICATIONS:  Outpatient Encounter Medications as of 08/16/2022  Medication Sig   acetaminophen (TYLENOL) 325 MG tablet Take 2 tablets (650 mg total) by mouth every 6 (six) hours as needed for mild pain or moderate pain.   apixaban (ELIQUIS) 5 MG TABS tablet Take 1 tablet (5 mg total) by mouth 2 (two) times daily. Start after treatment course of eliquis completed (10 mg BID eliquis course for 6 additional days) (Patient taking differently: Take 5 mg by mouth 2 (two) times daily.)   Carbidopa-Levodopa ER (SINEMET CR) 25-100 MG tablet controlled release TAKE 3 TABS AT 7 AM, 2 TABS AT 11AM, 2 TABS AT 4PM (Patient taking differently: Take 2-3 tablets by mouth See admin instructions. Take 3 tablets by mouth at 7 AM, 2 tablets by mouth at 11AM, 2 tablets by mouth at 4PM, per daughter)   diclofenac Sodium (VOLTAREN) 1 % GEL APPLY 4 G TOPICALLY 4 TIMES A DAY (Patient taking differently: Apply 2 g topically 4 (four) times daily.)   famotidine (PEPCID) 20  MG tablet Take 1 tablet (20 mg total) by mouth daily.   ferrous sulfate 325 (65 FE) MG tablet TAKE 1 TABLET BY MOUTH EVERY DAY WITH BREAKFAST (Patient taking differently: Take 325 mg by mouth daily with breakfast.)   levothyroxine (SYNTHROID) 100 MCG tablet TAKE 1 TABLET BY MOUTH EVERY MORNING 30 MINUTES BEFORE FOOD   lidocaine (LIDODERM) 5 % Place 1 patch onto the skin daily. Remove & Discard patch within 12 hours or as directed by MD (Patient taking differently: Place 1 patch onto the skin daily as needed (For pain).)   melatonin 3 MG TABS tablet Take 3 mg by mouth at bedtime as needed (For sleep).   metoprolol tartrate (LOPRESSOR) 25 MG tablet Take 0.5 tablets (12.5 mg total) by mouth 2 (two) times daily.   Multiple Vitamin (MULTIVITAMIN WITH MINERALS) TABS Take 1 tablet by mouth  daily.   olmesartan (BENICAR) 20 MG tablet Take 1 tablet (20 mg total) by mouth daily.   polyethylene glycol powder (GLYCOLAX/MIRALAX) 17 GM/SCOOP powder Take 17 g by mouth 2 (two) times daily as needed for mild constipation or moderate constipation.   rosuvastatin (CRESTOR) 10 MG tablet Take 1 tablet (10 mg total) by mouth daily.   senna (SENOKOT) 8.6 MG TABS tablet Take 2 tablets by mouth daily as needed for mild constipation.   No facility-administered encounter medications on file as of 08/16/2022.    Objective:   PHYSICAL EXAMINATION:    VITALS:   Vitals:   08/16/22 1044  BP: 124/84  Pulse: 86  Weight: 169 lb (76.7 kg)  Height: 5\' 2"  (1.575 m)       GEN:  The patient appears stated age and is in NAD. HEENT:  Normocephalic, atraumatic.  The mucous membranes are moist. The superficial temporal arteries are without ropiness or tenderness. CV:  RRR Lungs:  CTAB Neck/HEME:  There are no carotid bruits bilaterally.  Neurological examination:  Orientation: The patient is alert and oriented x3. Cranial nerves: There is good facial symmetry with facial hypomimia.  She would intermittently close 1 eye, but stated that she had no double vision.  She stated that she was purposefully closing it because she was sleepy.  This is the same as what she has done in the past.  The speech is fluent and clear. Soft palate rises symmetrically and there is no tongue deviation. Hearing is intact to conversational tone. Sensation: Sensation is intact to light touch throughout Motor: Strength is at least antigravity x4.   Movement examination: Tone: There is nl tone in the ue/le Abnormal movements: none Coordination:  There is decremation with RAM's, with any form of RAMS, including alternating supination and pronation of the forearm, hand opening and closing, finger taps, heel taps and toe taps, mostly on the right.  This is the same as last visit. Gait and Station: She is assisted out of the  transport chair.  She is given a walker.  She is slow and shuffles and only takes a few steps.  I have reviewed and interpreted the following labs independently    Chemistry      Component Value Date/Time   NA 141 05/27/2022 0855   NA 142 04/29/2022 1438   K 3.9 05/27/2022 0855   CL 107 05/27/2022 0855   CO2 28 05/27/2022 0855   BUN 14 05/27/2022 0855   BUN 15 04/29/2022 1438   CREATININE 0.65 05/27/2022 0855   CREATININE 0.52 04/21/2013 1526   GLU 133 08/27/2012 0000  Component Value Date/Time   CALCIUM 8.8 (L) 05/27/2022 0855   ALKPHOS 54 05/24/2022 1753   AST 18 05/24/2022 1753   ALT 5 05/24/2022 1753   BILITOT 0.7 05/24/2022 1753   BILITOT 0.3 04/29/2022 1438       Lab Results  Component Value Date   WBC 4.6 05/27/2022   HGB 10.4 (L) 05/27/2022   HCT 31.0 (L) 05/27/2022   MCV 75.4 (L) 05/27/2022   PLT 296 05/27/2022    Lab Results  Component Value Date   TSH 0.131 (L) 04/29/2022    Total time spent on today's visit was 31 minutes, including both face-to-face time and nonface-to-face time.  Time included that spent on review of records (prior notes available to me/labs/imaging if pertinent), discussing treatment and goals, answering patient's questions and coordinating care.    Cc:  [name], MD

## 2022-08-12 ENCOUNTER — Other Ambulatory Visit: Payer: Self-pay

## 2022-08-16 ENCOUNTER — Ambulatory Visit: Payer: Medicare Other | Admitting: Neurology

## 2022-08-16 ENCOUNTER — Encounter: Payer: Self-pay | Admitting: Neurology

## 2022-08-16 VITALS — BP 124/84 | HR 86 | Ht 62.0 in | Wt 169.0 lb

## 2022-08-16 DIAGNOSIS — G214 Vascular parkinsonism: Secondary | ICD-10-CM

## 2022-08-16 MED ORDER — CARBIDOPA-LEVODOPA 25-100 MG PO TABS: ORAL_TABLET | ORAL | 0 refills | Status: DC

## 2022-08-16 MED ORDER — CARBIDOPA-LEVODOPA ER 25-100 MG PO TBCR: EXTENDED_RELEASE_TABLET | ORAL | 1 refills | Status: DC

## 2022-08-24 DIAGNOSIS — N390 Urinary tract infection, site not specified: Secondary | ICD-10-CM | POA: Diagnosis not present

## 2022-08-24 DIAGNOSIS — R350 Frequency of micturition: Secondary | ICD-10-CM | POA: Diagnosis not present

## 2022-08-24 DIAGNOSIS — R8271 Bacteriuria: Secondary | ICD-10-CM | POA: Diagnosis not present

## 2022-08-27 ENCOUNTER — Other Ambulatory Visit: Payer: Self-pay | Admitting: Family Medicine

## 2022-08-27 DIAGNOSIS — K219 Gastro-esophageal reflux disease without esophagitis: Secondary | ICD-10-CM

## 2022-08-29 DIAGNOSIS — H34831 Tributary (branch) retinal vein occlusion, right eye, with macular edema: Secondary | ICD-10-CM | POA: Diagnosis not present

## 2022-09-03 ENCOUNTER — Encounter: Payer: Self-pay | Admitting: Family Medicine

## 2022-09-16 ENCOUNTER — Encounter: Payer: Self-pay | Admitting: Family Medicine

## 2022-09-16 ENCOUNTER — Ambulatory Visit (INDEPENDENT_AMBULATORY_CARE_PROVIDER_SITE_OTHER): Payer: Medicare Other | Admitting: Family Medicine

## 2022-09-16 VITALS — BP 131/70 | HR 82

## 2022-09-16 DIAGNOSIS — R399 Unspecified symptoms and signs involving the genitourinary system: Secondary | ICD-10-CM

## 2022-09-16 DIAGNOSIS — R350 Frequency of micturition: Secondary | ICD-10-CM | POA: Diagnosis not present

## 2022-09-16 DIAGNOSIS — Z742 Need for assistance at home and no other household member able to render care: Secondary | ICD-10-CM | POA: Diagnosis not present

## 2022-09-16 DIAGNOSIS — E119 Type 2 diabetes mellitus without complications: Secondary | ICD-10-CM | POA: Diagnosis not present

## 2022-09-16 DIAGNOSIS — I10 Essential (primary) hypertension: Secondary | ICD-10-CM

## 2022-09-16 LAB — POCT UA - MICROSCOPIC ONLY: WBC, Ur, HPF, POC: >20 (ref 0–5)

## 2022-09-16 LAB — POCT URINALYSIS DIP (CLINITEK)
Bilirubin, UA: NEGATIVE
Glucose, UA: NEGATIVE mg/dL
Ketones, POC UA: NEGATIVE mg/dL
Nitrite, UA: NEGATIVE
POC PROTEIN,UA: 30 — AB
Spec Grav, UA: 1.02 (ref 1.010–1.025)
Urobilinogen, UA: 0.2 E.U./dL
pH, UA: 7 (ref 5.0–8.0)

## 2022-09-16 MED ORDER — METOPROLOL TARTRATE 25 MG PO TABS: 12.5000 mg | ORAL_TABLET | Freq: Two times a day (BID) | ORAL | 0 refills | Status: DC

## 2022-09-16 NOTE — Progress Notes (Unsigned)
    SUBJECTIVE:   CHIEF COMPLAINT / HPI:   Patient presents for possible UTI, BP check   Has been seen by urology in November for recurrent bladder infections and was not convinced that she is getting breakthrough infections as she is currently on trimethoprim  Daughter says urine has been dark brown and with a foul odor and wonders about an infection. Denies burning or pain with urination. Does endorse urinary frequency . Feels that she is able to empty her bladder. Denies abdominal pain or blood in the urine.   PERTINENT  PMH / PSH: ***  OBJECTIVE:   BP 131/70   Pulse 82   SpO2 98%    Physical exam General: well appearing, NAD Cardiovascular: RRR, no murmurs Lungs: CTAB. Normal WOB Abdomen: soft, non-distended, non-tender Skin: warm, dry. No edema  ASSESSMENT/PLAN:   No problem-specific Assessment & Plan notes found for this encounter.   HTN  Currently taking olmesartan 20 mg daily, metoprolol tartrate 12.5 mg twice daily  Currently followed by urology for recurrent bladder infections.  Feels that she has bowel odor to her urine.  Will continue the trimethoprim and check urine culture.  Cora Collum, DO [facility]

## 2022-09-16 NOTE — Patient Instructions (Signed)
It was great seeing you today!  We checked your urine for infection and will treat if needed. I will call you if anything is abnormal.   I will also place the home health orders   Feel free to call with any questions or concerns at any time, at [phone].   Take care,  Dr. Cora Collum [facility]

## 2022-09-18 DIAGNOSIS — R350 Frequency of micturition: Secondary | ICD-10-CM | POA: Insufficient documentation

## 2022-09-18 NOTE — Assessment & Plan Note (Signed)
BP 131/70. Currently taking olmesartan 20 mg daily, metoprolol tartrate 12.5 mg twice daily. Will continue current regimen at this time

## 2022-09-18 NOTE — Assessment & Plan Note (Addendum)
Currently followed by urology for recurrent bladder infections and has been stable on Trimethoprim without breakthrough infections. Does have history of ESBL UTI. Will continue the trimethoprim and check UA and urine culture to ensure we do not need to adjust antibiotics.

## 2022-09-21 DIAGNOSIS — N390 Urinary tract infection, site not specified: Secondary | ICD-10-CM | POA: Diagnosis not present

## 2022-09-23 DIAGNOSIS — H5203 Hypermetropia, bilateral: Secondary | ICD-10-CM | POA: Diagnosis not present

## 2022-09-23 DIAGNOSIS — H2513 Age-related nuclear cataract, bilateral: Secondary | ICD-10-CM | POA: Diagnosis not present

## 2022-09-23 DIAGNOSIS — H25013 Cortical age-related cataract, bilateral: Secondary | ICD-10-CM | POA: Diagnosis not present

## 2022-09-23 DIAGNOSIS — H40033 Anatomical narrow angle, bilateral: Secondary | ICD-10-CM | POA: Diagnosis not present

## 2022-09-23 DIAGNOSIS — H524 Presbyopia: Secondary | ICD-10-CM | POA: Diagnosis not present

## 2022-09-23 DIAGNOSIS — H348112 Central retinal vein occlusion, right eye, stable: Secondary | ICD-10-CM | POA: Diagnosis not present

## 2022-09-28 DIAGNOSIS — M2141 Flat foot [pes planus] (acquired), right foot: Secondary | ICD-10-CM | POA: Diagnosis not present

## 2022-09-28 DIAGNOSIS — M792 Neuralgia and neuritis, unspecified: Secondary | ICD-10-CM | POA: Diagnosis not present

## 2022-09-28 DIAGNOSIS — M205X1 Other deformities of toe(s) (acquired), right foot: Secondary | ICD-10-CM | POA: Diagnosis not present

## 2022-10-04 ENCOUNTER — Other Ambulatory Visit: Payer: Self-pay | Admitting: Family Medicine

## 2022-10-14 ENCOUNTER — Ambulatory Visit: Payer: Medicare Other | Admitting: Family Medicine

## 2022-10-17 ENCOUNTER — Telehealth: Payer: Self-pay

## 2022-10-17 DIAGNOSIS — H34831 Tributary (branch) retinal vein occlusion, right eye, with macular edema: Secondary | ICD-10-CM | POA: Diagnosis not present

## 2022-10-17 NOTE — Telephone Encounter (Signed)
Left message for patient to call back to schedule Medicare Annual Wellness Visit   Last AWV  01/23/20  Please schedule at anytime with [place] if patient calls the office back.     Any questions, please call me at [phone]

## 2022-10-19 DIAGNOSIS — M792 Neuralgia and neuritis, unspecified: Secondary | ICD-10-CM | POA: Diagnosis not present

## 2022-10-19 DIAGNOSIS — M205X1 Other deformities of toe(s) (acquired), right foot: Secondary | ICD-10-CM | POA: Diagnosis not present

## 2022-10-20 DIAGNOSIS — H25011 Cortical age-related cataract, right eye: Secondary | ICD-10-CM | POA: Diagnosis not present

## 2022-10-20 DIAGNOSIS — H269 Unspecified cataract: Secondary | ICD-10-CM | POA: Diagnosis not present

## 2022-10-20 DIAGNOSIS — H2511 Age-related nuclear cataract, right eye: Secondary | ICD-10-CM | POA: Diagnosis not present

## 2022-10-20 DIAGNOSIS — H25811 Combined forms of age-related cataract, right eye: Secondary | ICD-10-CM | POA: Diagnosis not present

## 2022-10-28 ENCOUNTER — Other Ambulatory Visit: Payer: Self-pay | Admitting: Family Medicine

## 2022-11-14 ENCOUNTER — Other Ambulatory Visit: Payer: Self-pay

## 2022-11-14 ENCOUNTER — Encounter: Payer: Self-pay | Admitting: Family Medicine

## 2022-11-14 ENCOUNTER — Ambulatory Visit (INDEPENDENT_AMBULATORY_CARE_PROVIDER_SITE_OTHER): Payer: Medicare Other | Admitting: Family Medicine

## 2022-11-14 VITALS — BP 101/60 | HR 73 | Ht 62.0 in

## 2022-11-14 DIAGNOSIS — R5383 Other fatigue: Secondary | ICD-10-CM | POA: Diagnosis not present

## 2022-11-14 DIAGNOSIS — E119 Type 2 diabetes mellitus without complications: Secondary | ICD-10-CM | POA: Diagnosis not present

## 2022-11-14 LAB — POCT GLYCOSYLATED HEMOGLOBIN (HGB A1C): Hemoglobin A1C: 5.2 % (ref 4.0–5.6)

## 2022-11-14 NOTE — Patient Instructions (Addendum)
Good to see you today - Thank you for coming in  Things we discussed today:  We will check labs.  I will call you if something is wrong  If she starts to have urine symptoms bring in a sample   Stop taking her Olmesartan - I am concerned her blood pressure is too low  If she develops fever or passes out completely or has severe chest pain or shortness of breath she should go to the ER   See Dr Thompson Grayer for a visit next week - Bring in all her pill bottles

## 2022-11-14 NOTE — Progress Notes (Unsigned)
Office Visit    Patient Name: Anna Bernard Date of Encounter: 11/15/2022  PCP:  [name], MD   [place]  Cardiologist:  Fransico Him, MD  Advanced Practice Provider:  No care team member to display Electrophysiologist:  None   HPI    Anna Bernard is a 72 y.o. female with past medical history significant for hyperlipidemia (followed by PCP, hypertension, prediabetes, Parkinson's disease, vitamin B12 deficiency, microcytic anemia, chronic low back pain, and multiple falls.  She has a history of paroxysmal atrial fibrillation occurring in the setting of sepsis with UTI and pulmonary embolism.  She converted to sinus on her own.  She had an elevated troponin at that hospitalization with a peak troponin of 553 with coronary CTA showing only mild nonobstructive CAD and nonocclusive right PE.  2D echo showed  recently admitted for fall/dizziness and found to have AKI in the setting of sepsis secondary to UTI, new onset atrial fibrillation with RVR, elevated troponin, and Pe.  2D echo showed EF 60 to 65%, mild HK of the left ventricle, basal inferolateral wall, moderate MAC, aortic sclerosis without stenosis, cannot exclude PFO, no visual changes from prior.  Treated with VTE dosing Eliquis at discharge.  She is here today for followup and is doing well.  She has chronic DOE that is stable. She denies any chest pain or pressure, PND, orthopnea, LE edema or palpitations .  She apparently had a syncopal episode while in the bathroom.  Last week She was sitting in a chair and told her husband she felt sleepy and her eyes rolled back in her head and was unresponsive for about 5 min and then came to.  He did not call EMS. It then occurred a few days ago after taking a hot shower.  She was sitting in the shower and did fine and after her husband and daughter got her out of the shower and she was sitting getting dryed off and then passed out 1-2 min. She had no seizure  activity.  She has no confusion after the event.  It She is compliant with her meds and is tolerating meds with no SE.    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[place])    possible by duplex 2023   Vitamin B12 deficiency    Past Surgical History:  Procedure Laterality Date   ABDOMINAL HYSTERECTOMY     GIVENS CAPSULE STUDY N/A 04/16/2018   Procedure: GIVENS CAPSULE STUDY;  Surgeon: Carol Ada, MD;  Location: [facility];  Service: Endoscopy;  Laterality: N/A;   PARS PLANA VITRECTOMY Right 08/16/2020   Procedure: 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;  Surgeon: Jalene Mullet, MD;  Location: [facility];  Service: Ophthalmology;  Laterality: Right;   PARS PLANA VITRECTOMY Right 09/29/2020   Procedure: PARS PLANA VITRECTOMY WITH 25 GAUGE - ENDOCAUTRY;  Surgeon: Jalene Mullet, MD;  Location: [facility];  Service: Ophthalmology;  Laterality: Right;   PHOTOCOAGULATION WITH LASER Right 08/16/2020   Procedure: PHOTOCOAGULATION WITH LASER;  Surgeon: Jalene Mullet, MD;  Location: [facility];  Service: Ophthalmology;  Laterality: Right;  PHOTOCOAGULATION WITH LASER Right 09/29/2020   Procedure: PHOTOCOAGULATION WITH LASER;  Surgeon: Jalene Mullet, MD;  Location: [facility];  Service: Ophthalmology;  Laterality: Right;   SHOULDER SURGERY     TONSILLECTOMY     TUBAL LIGATION      Allergies  No Known Allergies  EKGs/Labs/Other Studies Reviewed:   The following studies were reviewed today:   2D echo 01/17/22   1. Left ventricular ejection fraction, by estimation, is 60 to 65%. The  left ventricle has normal function. The left ventricle demonstrates  regional wall motion  abnormalities (see scoring diagram/findings for  description). There is mild left ventricular   hypertrophy. Left ventricular diastolic parameters were normal. There is  mild hypokinesis of the left ventricular, basal inferolateral wall.   2. Right ventricular systolic function is normal. The right ventricular  size is normal.   3. Left atrial size was moderately dilated.   4. The mitral valve is normal in structure. Trivial mitral valve  regurgitation. No evidence of mitral stenosis. Moderate mitral annular  calcification.   5. The aortic valve is normal in structure. There is mild calcification  of the aortic valve. Aortic valve regurgitation is trivial. Aortic valve  sclerosis is present, with no evidence of aortic valve stenosis.   6. The inferior vena cava is normal in size with <50% respiratory  variability, suggesting right atrial pressure of 8 mmHg.   7. Cannot exclude a small PFO.   Comparison(s): No significant change from prior study. Prior images  reviewed side by side. Mild hypokinesis of basal inferolateral wall seen  on current and prior study, no visual change between studies.    Cor CT - see EMR for report    EKG:  EKG is not ordered today.    Recent Labs: 11/14/2022: ALT 5; BUN 14; Creatinine, Ser 0.80; Hemoglobin 7.5; Platelets 449; Potassium 4.4; Sodium 139; TSH 0.619  Recent Lipid Panel    Component Value Date/Time   CHOL 133 01/17/2022 0234   CHOL 160 06/29/2021 1044   TRIG 65 01/17/2022 0234   HDL 48 01/17/2022 0234   HDL 69 06/29/2021 1044   CHOLHDL 2.8 01/17/2022 0234   VLDL 13 01/17/2022 0234   LDLCALC 72 01/17/2022 0234   LDLCALC 79 06/29/2021 1044    Risk Assessment/Calculations:   CHA2DS2-VASc Score = 3   This indicates a 3.2% annual risk of stroke. The patient's score is based upon: CHF History: 0 HTN History: 1 Diabetes History: 0 Stroke History: 0 Vascular Disease History: 0 Age Score: 1 Gender Score: 1     Review of Systems       All other systems reviewed and are otherwise negative except as noted above.  Physical Exam    VS:  BP 122/88   Pulse 69   Ht '5\' 1"'$  (1.549 m)   Wt 156 lb (70.8 kg) Comment: per daughter, pt unable to stand  SpO2 98%   BMI 29.48 kg/m  , BMI Body mass index is 29.48 kg/m.  Wt Readings from Last 3 Encounters:  11/15/22 156 lb (70.8 kg)  08/16/22 169 lb (76.7 kg)  05/24/22 154 lb 8.7 oz (70.1 kg)    GEN: Well nourished, well developed in no acute distress HEENT: Normal NECK: No JVD; No carotid bruits LYMPHATICS: No lymphadenopathy CARDIAC:RRR, no rubs, gallop 2/6 SM at RUSB RESPIRATORY:  Clear to auscultation without rales, wheezing or rhonchi  ABDOMEN: Soft, non-tender, non-distended MUSCULOSKELETAL:  No edema; No deformity  SKIN: Warm and dry NEUROLOGIC:  Alert and oriented x 3 PSYCHIATRIC:  Normal affect  Assessment & Plan    Paroxysmal atrial fibrillation -She is maintaining normal sinus rhythm on exam today  -Continue prescription drug management with Eliquis 5 mg twice daily and Lopressor 12.5 mg twice daily with as needed refills -She denies any bleeding problems on DOAC -I have personally reviewed and interpreted outside labs performed by patient's PCP which showed serum creatinine 0.65 and hemoglobin 10.4 on 05/27/2022  Mild CAD -She denies any anginal chest pain  -Continue GDMT with Lopressor 12.5 mg twice daily, Crestor 10 mg daily  -No ASA due to DOAC   Recent PE -on chronic anticoagulation with Eliquis 5 mg twice daily  Possible PFO -treated with anticoagulation, no closure needed at this time  Lower extremity edema/chronic venous insufficiency -Right-sided lower extremity edema is chronic for her and apparently was diagnosed with lymphedema in the past  Right carotid bruit -Recent carotid ultrasound 02/2022 showed mild carotid plaque with some disturbed flow in the right subclavian artery which is a likely culprit of her bruit. -Continue statin  therapy -No aspirin due to DOAC  7 .  HLD -LDL goal less than 70 -I have personally reviewed and interpreted outside labs performed by patient's PCP which showed LDL 72 and HDL 48 on 01/17/2022 -Continue prescription drug management with Crestor 10 mg daily with as needed refills  8.  Syncope -likely orthostatic -1 event occurred after being in a hot shower and the other time occurred after she had been in the bathroom and was getting into bed -I will check an event monitor to assess for arrhythmias -repeat 2D echo  -repeat carotid dopplers  Followup in 3 weeks with extender         Disposition: Follow up 3 weeks  with  APP.  Signed, Fransico Him, MD 11/15/2022, 11:23 AM [place]

## 2022-11-14 NOTE — Assessment & Plan Note (Addendum)
Vague complaints from relatives that she is sometimes less energetic than usual.  May have episodes of less responsive after shower ? Hypotension? Does not seem to have infection symptoms.  Her blood pressure is slightly lower than has been in the past.    Given history will check TSH and cbc since on eliquis and CMET with prior mild calcium abnormality.  Will ask to hold her olmesartan.  She has visit with cardiologist tomorrow.  Ask her to come in next week with all her medications   Unable to urinate today.   Does not sound like typical UTI symptoms.  Asked them to bring in UA if having persistent symptoms,

## 2022-11-14 NOTE — Progress Notes (Signed)
    SUBJECTIVE:   CHIEF COMPLAINT / HPI:   Dark Urine On and off for weeks.  Usually no pain.  No fever.  Sometimes urinates a lot and sometimes very little  Are taking some pill form her urologist - don't know the name.  Not on the list they have   Fatigue Her son in law who she stays with relates sometimes seems more sleepy than usual. When takes a shower will some time seem to be less responsive of a short while especially if took Parkinsons medications. No complaints of chest pain or shortness of breath or any shaking movements or incontinence with episodes She does not walk but can stand for a short time   They did not bring in her pill bottles but just the box.  Know some of her medications. Unsure if she is taking thyroid medication   PERTINENT  PMH / PSH: Hypertension, PAF, Parkinson, DIABETES ESBL,  Eliquis, Levodopa OBJECTIVE:   BP 101/60   Pulse 73   Ht 5' 2"$  (1.575 m)   SpO2 100%   BMI 30.91 kg/m   Alert sitting in WC Knows her relatives in the room Denies any urinary symptoms  Heart - Regular rate and rhythm.  No murmurs, gallops or rubs.    Lungs:  Normal respiratory effort, chest expands symmetrically. Lungs are clear to auscultation, no crackles or wheezes. No edema MM - moist   ASSESSMENT/PLAN:   Controlled type 2 diabetes mellitus without complication, without long-term current use of insulin (HCC) -     POCT glycosylated hemoglobin (Hb A1C)  Other fatigue Assessment & Plan: Vague complaints from relatives that she is sometimes less energetic than usual.  May have episodes of less responsive after shower ? Hypotension? Does not seem to have infection symptoms.  Her blood pressure is slightly lower than has been in the past.    Given history will check TSH and cbc since on eliquis and CMET with prior mild calcium abnormality.  Will ask to hold her olmesartan.  She has visit with cardiologist tomorrow.  Ask her to come in next week with all her medications    Unable to urinate today.   Does not sound like typical UTI symptoms.  Asked them to bring in UA if having persistent symptoms,     Orders: -     TSH -     CBC -     [id number]+EGFR     Patient Instructions  Good to see you today - Thank you for coming in  Things we discussed today:  We will check labs.  I will call you if something is wrong  If she starts to have urine symptoms bring in a sample   Stop taking her Olmesartan - I am concerned her blood pressure is too low  If she develops fever or passes out completely or has severe chest pain or shortness of breath she should go to the ER   See Dr Thompson Grayer for a visit next week - Bring in all her pill bottles   [name], MD [place]

## 2022-11-15 ENCOUNTER — Encounter: Payer: Self-pay | Admitting: Cardiology

## 2022-11-15 ENCOUNTER — Telehealth: Payer: Self-pay | Admitting: Family Medicine

## 2022-11-15 ENCOUNTER — Ambulatory Visit: Payer: Medicare Other | Attending: Cardiology | Admitting: Cardiology

## 2022-11-15 ENCOUNTER — Ambulatory Visit: Payer: Medicare Other | Attending: Cardiology

## 2022-11-15 VITALS — BP 122/88 | HR 69 | Ht 61.0 in | Wt 156.0 lb

## 2022-11-15 DIAGNOSIS — Q2112 Patent foramen ovale: Secondary | ICD-10-CM

## 2022-11-15 DIAGNOSIS — R600 Localized edema: Secondary | ICD-10-CM

## 2022-11-15 DIAGNOSIS — Z86711 Personal history of pulmonary embolism: Secondary | ICD-10-CM

## 2022-11-15 DIAGNOSIS — I6521 Occlusion and stenosis of right carotid artery: Secondary | ICD-10-CM

## 2022-11-15 DIAGNOSIS — R55 Syncope and collapse: Secondary | ICD-10-CM | POA: Diagnosis not present

## 2022-11-15 DIAGNOSIS — I872 Venous insufficiency (chronic) (peripheral): Secondary | ICD-10-CM | POA: Diagnosis not present

## 2022-11-15 DIAGNOSIS — I2510 Atherosclerotic heart disease of native coronary artery without angina pectoris: Secondary | ICD-10-CM

## 2022-11-15 DIAGNOSIS — I480 Paroxysmal atrial fibrillation: Secondary | ICD-10-CM | POA: Diagnosis not present

## 2022-11-15 LAB — CMP14+EGFR
ALT: 5 IU/L (ref 0–32)
AST: 13 IU/L (ref 0–40)
Albumin/Globulin Ratio: 1.3 (ref 1.2–2.2)
Albumin: 3.8 g/dL — ABNORMAL LOW (ref 3.9–4.9)
Alkaline Phosphatase: 73 IU/L (ref 44–121)
BUN/Creatinine Ratio: 18 (ref 12–28)
BUN: 14 mg/dL (ref 8–27)
Bilirubin Total: 0.2 mg/dL (ref 0.0–1.2)
CO2: 23 mmol/L (ref 20–29)
Calcium: 8.9 mg/dL (ref 8.7–10.3)
Chloride: 106 mmol/L (ref 96–106)
Creatinine, Ser: 0.8 mg/dL (ref 0.57–1.00)
Globulin, Total: 3 g/dL (ref 1.5–4.5)
Glucose: 85 mg/dL (ref 70–99)
Potassium: 4.4 mmol/L (ref 3.5–5.2)
Sodium: 139 mmol/L (ref 134–144)
Total Protein: 6.8 g/dL (ref 6.0–8.5)
eGFR: 80 mL/min/{1.73_m2} (ref >59)

## 2022-11-15 LAB — CBC
Hematocrit: 24.5 % — ABNORMAL LOW (ref 34.0–46.6)
Hemoglobin: 7.5 g/dL — ABNORMAL LOW (ref 11.1–15.9)
MCH: 23 pg — ABNORMAL LOW (ref 26.6–33.0)
MCHC: 30.6 g/dL — ABNORMAL LOW (ref 31.5–35.7)
MCV: 75 fL — ABNORMAL LOW (ref 79–97)
Platelets: 449 10*3/uL (ref 150–450)
RBC: 3.26 x10E6/uL — ABNORMAL LOW (ref 3.77–5.28)
RDW: 15.5 % — ABNORMAL HIGH (ref 11.7–15.4)
WBC: 5.1 10*3/uL (ref 3.4–10.8)

## 2022-11-15 LAB — TSH: TSH: 0.619 u[IU]/mL (ref 0.450–4.500)

## 2022-11-15 MED ORDER — FERROUS SULFATE 324 (65 FE) MG PO TBEC: DELAYED_RELEASE_TABLET | ORAL | 1 refills | Status: DC

## 2022-11-15 NOTE — Telephone Encounter (Signed)
Spoke with husband.  That she had anemia likely from her blood thinner  Told him I felt risk of bleeding was  higher than her risk of blood clot.   Decided to stop eliquis and start iron on tab per day  If any chest pain or shortness of breath or feeling a lot worse should go to the ER  Recommend he follow up with her PCP in the next week or so.  He agrees

## 2022-11-15 NOTE — Progress Notes (Unsigned)
[employer] [id number] 30 day cardiac event monitor from office inventory applied to patient.

## 2022-11-15 NOTE — Patient Instructions (Signed)
Medication Instructions:  Your physician recommends that you continue on your current medications as directed. Please refer to the Current Medication list given to you today.  *If you need a refill on your cardiac medications before your next appointment, please call your pharmacy*   Testing/Procedures: Your physician has recommended that you wear an event monitor. Event monitors are medical devices that record the heart's electrical activity. Doctors most often [place] these monitors to diagnose arrhythmias. Arrhythmias are problems with the speed or rhythm of the heartbeat. The monitor is a small, portable device. You can wear one while you do your normal daily activities. This is usually used to diagnose what is causing palpitations/syncope (passing out).   Your physician has requested that you have a carotid duplex. This test is an ultrasound of the carotid arteries in your neck. It looks at blood flow through these arteries that supply the brain with blood. Allow one hour for this exam. There are no restrictions or special instructions.   Your physician has requested that you have an echocardiogram. Echocardiography is a painless test that uses sound waves to create images of your heart. It provides your doctor with information about the size and shape of your heart and how well your heart's chambers and valves are working. This procedure takes approximately one hour. There are no restrictions for this procedure. Please do NOT wear cologne, perfume, aftershave, or lotions (deodorant is allowed). Please arrive 15 minutes prior to your appointment time.   Follow-Up: At [facility], you and your health needs are our priority.  As part of our continuing mission to provide you with exceptional heart care, we have created designated Provider Care Teams.  These Care Teams include your primary Cardiologist (physician) and Advanced Practice Providers (APPs -  Physician Assistants and Nurse  Practitioners) who all work together to provide you with the care you need, when you need it.  Your next appointment:   3 week(s)  Provider:   APP  Other Instructions Support [facility] [place]  [address] [place] [address] [phone]  [email or web address]

## 2022-11-16 ENCOUNTER — Ambulatory Visit: Payer: Medicare Other | Admitting: Cardiology

## 2022-11-17 ENCOUNTER — Observation Stay (HOSPITAL_COMMUNITY)
Admission: EM | Admit: 2022-11-17 | Discharge: 2022-11-20 | Disposition: A | Discharge status: Home Health | Payer: Medicare Other | Attending: Internal Medicine | Admitting: Internal Medicine

## 2022-11-17 ENCOUNTER — Inpatient Hospital Stay (HOSPITAL_COMMUNITY): Payer: Medicare Other

## 2022-11-17 ENCOUNTER — Telehealth: Payer: Self-pay

## 2022-11-17 ENCOUNTER — Encounter (HOSPITAL_COMMUNITY): Payer: Self-pay

## 2022-11-17 ENCOUNTER — Emergency Department (HOSPITAL_COMMUNITY): Payer: Medicare Other

## 2022-11-17 ENCOUNTER — Other Ambulatory Visit: Payer: Self-pay

## 2022-11-17 DIAGNOSIS — Z7901 Long term (current) use of anticoagulants: Secondary | ICD-10-CM | POA: Insufficient documentation

## 2022-11-17 DIAGNOSIS — G20A1 Parkinson's disease without dyskinesia, without mention of fluctuations: Secondary | ICD-10-CM | POA: Diagnosis present

## 2022-11-17 DIAGNOSIS — I480 Paroxysmal atrial fibrillation: Secondary | ICD-10-CM | POA: Diagnosis present

## 2022-11-17 DIAGNOSIS — K3189 Other diseases of stomach and duodenum: Secondary | ICD-10-CM | POA: Diagnosis not present

## 2022-11-17 DIAGNOSIS — Z86711 Personal history of pulmonary embolism: Secondary | ICD-10-CM | POA: Diagnosis not present

## 2022-11-17 DIAGNOSIS — Z79899 Other long term (current) drug therapy: Secondary | ICD-10-CM | POA: Insufficient documentation

## 2022-11-17 DIAGNOSIS — E039 Hypothyroidism, unspecified: Secondary | ICD-10-CM | POA: Diagnosis not present

## 2022-11-17 DIAGNOSIS — D509 Iron deficiency anemia, unspecified: Secondary | ICD-10-CM | POA: Insufficient documentation

## 2022-11-17 DIAGNOSIS — I10 Essential (primary) hypertension: Secondary | ICD-10-CM | POA: Diagnosis not present

## 2022-11-17 DIAGNOSIS — G20C Parkinsonism, unspecified: Secondary | ICD-10-CM | POA: Diagnosis not present

## 2022-11-17 DIAGNOSIS — I2510 Atherosclerotic heart disease of native coronary artery without angina pectoris: Secondary | ICD-10-CM | POA: Diagnosis not present

## 2022-11-17 DIAGNOSIS — D500 Iron deficiency anemia secondary to blood loss (chronic): Secondary | ICD-10-CM | POA: Diagnosis present

## 2022-11-17 DIAGNOSIS — D649 Anemia, unspecified: Secondary | ICD-10-CM | POA: Diagnosis not present

## 2022-11-17 DIAGNOSIS — R55 Syncope and collapse: Secondary | ICD-10-CM | POA: Diagnosis present

## 2022-11-17 DIAGNOSIS — E119 Type 2 diabetes mellitus without complications: Secondary | ICD-10-CM | POA: Insufficient documentation

## 2022-11-17 DIAGNOSIS — I951 Orthostatic hypotension: Secondary | ICD-10-CM

## 2022-11-17 DIAGNOSIS — I2699 Other pulmonary embolism without acute cor pulmonale: Secondary | ICD-10-CM | POA: Diagnosis present

## 2022-11-17 DIAGNOSIS — R404 Transient alteration of awareness: Secondary | ICD-10-CM | POA: Diagnosis not present

## 2022-11-17 DIAGNOSIS — Z87891 Personal history of nicotine dependence: Secondary | ICD-10-CM | POA: Insufficient documentation

## 2022-11-17 DIAGNOSIS — Z743 Need for continuous supervision: Secondary | ICD-10-CM | POA: Diagnosis not present

## 2022-11-17 DIAGNOSIS — M25552 Pain in left hip: Secondary | ICD-10-CM | POA: Diagnosis not present

## 2022-11-17 DIAGNOSIS — R42 Dizziness and giddiness: Secondary | ICD-10-CM | POA: Diagnosis not present

## 2022-11-17 LAB — D-DIMER, QUANTITATIVE: D-Dimer, Quant: 0.52 ug/mL-FEU — ABNORMAL HIGH (ref 0.00–0.50)

## 2022-11-17 LAB — CBC WITH DIFFERENTIAL/PLATELET
Abs Immature Granulocytes: 0.02 10*3/uL (ref 0.00–0.07)
Basophils Absolute: 0.1 10*3/uL (ref 0.0–0.1)
Basophils Relative: 1 %
Eosinophils Absolute: 0.1 10*3/uL (ref 0.0–0.5)
Eosinophils Relative: 1 %
HCT: 23.7 % — ABNORMAL LOW (ref 36.0–46.0)
Hemoglobin: 7.5 g/dL — ABNORMAL LOW (ref 12.0–15.0)
Immature Granulocytes: 0 %
Lymphocytes Relative: 16 %
Lymphs Abs: 1.1 10*3/uL (ref 0.7–4.0)
MCH: 22.9 pg — ABNORMAL LOW (ref 26.0–34.0)
MCHC: 31.6 g/dL (ref 30.0–36.0)
MCV: 72.5 fL — ABNORMAL LOW (ref 80.0–100.0)
Monocytes Absolute: 0.4 10*3/uL (ref 0.1–1.0)
Monocytes Relative: 7 %
Neutro Abs: 5 10*3/uL (ref 1.7–7.7)
Neutrophils Relative %: 75 %
Platelets: 437 10*3/uL — ABNORMAL HIGH (ref 150–400)
RBC: 3.27 MIL/uL — ABNORMAL LOW (ref 3.87–5.11)
RDW: 15.9 % — ABNORMAL HIGH (ref 11.5–15.5)
WBC: 6.6 10*3/uL (ref 4.0–10.5)
nRBC: 0 % (ref 0.0–0.2)

## 2022-11-17 LAB — BASIC METABOLIC PANEL
Anion gap: 6 (ref 5–15)
BUN: 13 mg/dL (ref 8–23)
CO2: 25 mmol/L (ref 22–32)
Calcium: 8.4 mg/dL — ABNORMAL LOW (ref 8.9–10.3)
Chloride: 108 mmol/L (ref 98–111)
Creatinine, Ser: 0.74 mg/dL (ref 0.44–1.00)
GFR, Estimated: >60 mL/min (ref >60)
Glucose, Bld: 101 mg/dL — ABNORMAL HIGH (ref 70–99)
Potassium: 4.2 mmol/L (ref 3.5–5.1)
Sodium: 139 mmol/L (ref 135–145)

## 2022-11-17 LAB — URINALYSIS, ROUTINE W REFLEX MICROSCOPIC
Bilirubin Urine: NEGATIVE
Glucose, UA: NEGATIVE mg/dL
Ketones, ur: 5 mg/dL — AB
Nitrite: POSITIVE — AB
Protein, ur: NEGATIVE mg/dL
Specific Gravity, Urine: 1.005 (ref 1.005–1.030)
pH: 6 (ref 5.0–8.0)

## 2022-11-17 LAB — VITAMIN B12: Vitamin B-12: 305 pg/mL (ref 180–914)

## 2022-11-17 LAB — TROPONIN I (HIGH SENSITIVITY)
Troponin I (High Sensitivity): 4 ng/L (ref <18)
Troponin I (High Sensitivity): 4 ng/L (ref <18)
Troponin I (High Sensitivity): 5 ng/L (ref <18)
Troponin I (High Sensitivity): 4 ng/L (ref <18)

## 2022-11-17 LAB — RETICULOCYTES
Immature Retic Fract: 16.4 % — ABNORMAL HIGH (ref 2.3–15.9)
RBC.: 3.69 MIL/uL — ABNORMAL LOW (ref 3.87–5.11)
Retic Count, Absolute: 49.1 10*3/uL (ref 19.0–186.0)
Retic Ct Pct: 1.3 % (ref 0.4–3.1)

## 2022-11-17 LAB — IRON AND TIBC
Iron: 25 ug/dL — ABNORMAL LOW (ref 28–170)
Saturation Ratios: 5 % — ABNORMAL LOW (ref 10.4–31.8)
TIBC: 469 ug/dL — ABNORMAL HIGH (ref 250–450)
UIBC: 444 ug/dL

## 2022-11-17 LAB — FERRITIN: Ferritin: 6 ng/mL — ABNORMAL LOW (ref 11–307)

## 2022-11-17 LAB — PREPARE RBC (CROSSMATCH)

## 2022-11-17 LAB — CBG MONITORING, ED: Glucose-Capillary: 83 mg/dL (ref 70–99)

## 2022-11-17 LAB — FOLATE: Folate: 20.6 ng/mL (ref >5.9)

## 2022-11-17 MED ORDER — ACETAMINOPHEN 10 MG/ML IV SOLN: 1000.0000 mg | Freq: Once | INTRAVENOUS | Status: DC

## 2022-11-17 MED ORDER — ONDANSETRON HCL 4 MG PO TABS: 4.0000 mg | ORAL_TABLET | Freq: Four times a day (QID) | ORAL | Status: DC | PRN

## 2022-11-17 MED ORDER — ONDANSETRON HCL 4 MG/2ML IJ SOLN: 4.0000 mg | Freq: Four times a day (QID) | INTRAMUSCULAR | Status: DC | PRN

## 2022-11-17 MED ORDER — LIDOCAINE 5 % EX PTCH
1.0000 | MEDICATED_PATCH | CUTANEOUS | Status: DC
  Administered 2022-11-17 – 2022-11-19 (×2): 1 via TRANSDERMAL
  Filled 2022-11-17 (×2): qty 1

## 2022-11-17 MED ORDER — ACETAMINOPHEN 325 MG PO TABS
650.0000 mg | ORAL_TABLET | Freq: Four times a day (QID) | ORAL | Status: DC | PRN
  Administered 2022-11-17: 650 mg via ORAL
  Filled 2022-11-17: qty 2

## 2022-11-17 MED ORDER — SODIUM CHLORIDE 0.9 % IV BOLUS
500.0000 mL | Freq: Once | INTRAVENOUS | Status: AC
  Administered 2022-11-17: 500 mL via INTRAVENOUS

## 2022-11-17 MED ORDER — SODIUM CHLORIDE 0.9% IV SOLUTION: Freq: Once | INTRAVENOUS | Status: DC

## 2022-11-17 MED ORDER — ACETAMINOPHEN 325 MG PO TABS
650.0000 mg | ORAL_TABLET | Freq: Once | ORAL | Status: AC
  Administered 2022-11-17: 650 mg via ORAL
  Filled 2022-11-17: qty 2

## 2022-11-17 MED ORDER — SODIUM CHLORIDE 0.9% FLUSH
3.0000 mL | Freq: Two times a day (BID) | INTRAVENOUS | Status: DC
  Administered 2022-11-17 – 2022-11-20 (×5): 3 mL via INTRAVENOUS

## 2022-11-17 MED ORDER — ACETAMINOPHEN 650 MG RE SUPP: 650.0000 mg | Freq: Four times a day (QID) | RECTAL | Status: DC | PRN

## 2022-11-17 MED ORDER — HYDROCODONE-ACETAMINOPHEN 5-325 MG PO TABS
1.0000 | ORAL_TABLET | Freq: Four times a day (QID) | ORAL | Status: DC | PRN
  Administered 2022-11-18 – 2022-11-19 (×2): 1 via ORAL
  Filled 2022-11-17 (×2): qty 1

## 2022-11-17 NOTE — ED Provider Notes (Signed)
[facility] Provider Note   CSN: [id number] Arrival date & time: 11/17/22  1255     History  Chief Complaint  Patient presents with   Loss of Consciousness    Anna Bernard is a 72 y.o. female.   Loss of Consciousness    Patient presented to the ED for evaluation of syncope.  Patient has history of PE, hypertension, diabetes, syncope, renal insufficiency, Parkinson's disease, non-ST elevation MI.  Patient has had a few episodes of syncope recently.  She saw her cardiologist in the office 2 days ago.  Patient started having episodes last week with 1 episode occurring in the chair the other when she was taking a hot shower.  Plan was for outpatient cardiac monitor as well as repeat echo and repeat carotid Dopplers.  Patient was in the shower today when she started to feel very hot.  Family was able to assist her to the ground.  Patient states she is not sure exactly how long she had loss of consciousness.  She is not having any problems of chest pain or shortness of breath.  She has not noticed any blood in her stool.  Patient not paid taking her Eliquis as she was instructed  Home Medications Prior to Admission medications   Medication Sig Start Date End Date Taking? Authorizing Provider  acetaminophen (TYLENOL) 325 MG tablet Take 2 tablets (650 mg total) by mouth every 6 (six) hours as needed for mild pain or moderate pain. 01/19/22   Gerrit Heck, MD  apixaban (ELIQUIS) 5 MG TABS tablet Take 1 tablet (5 mg total) by mouth 2 (two) times daily. Start after treatment course of eliquis completed (10 mg BID eliquis course for 6 additional days) Patient taking differently: Take 5 mg by mouth 2 (two) times daily. 04/29/22   [name], MD  Carbidopa-Levodopa ER (SINEMET CR) 25-100 MG tablet controlled release TAKE 3 TABS AT 7 AM, 2 TABS AT 11AM, 2 TABS AT 4PM 08/16/22   Tat, Rebecca S, DO  diclofenac Sodium (VOLTAREN) 1 % GEL APPLY 4 G  TOPICALLY 4 TIMES A DAY Patient taking differently: Apply 2 g topically as needed. 04/29/22   [name], MD  famotidine (PEPCID) 20 MG tablet TAKE 1 TABLET BY MOUTH EVERY DAY 08/29/22   [name], MD  ferrous sulfate 324 (65 Fe) MG TBEC 1 daily 11/15/22   [name], MD  ferrous sulfate 325 (65 FE) MG tablet TAKE 1 TABLET BY MOUTH EVERY DAY WITH BREAKFAST Patient taking differently: Take 325 mg by mouth daily with breakfast. 01/04/22   [name], MD  levothyroxine (SYNTHROID) 100 MCG tablet TAKE 1 TABLET BY MOUTH EVERY MORNING 30 MINUTES BEFORE FOOD 06/27/22   [name], MD  lidocaine (LIDODERM) 5 % Place 1 patch onto the skin daily. Remove & Discard patch within 12 hours or as directed by MD Patient taking differently: Place 1 patch onto the skin daily as needed (For pain). 01/19/22   Gerrit Heck, MD  melatonin 3 MG TABS tablet Take 3 mg by mouth at bedtime as needed (For sleep). 01/24/22   [provider]  metoprolol tartrate (LOPRESSOR) 25 MG tablet TAKE 1/2 TABLET TWICE A DAY BY MOUTH 10/28/22   [name], MD  Multiple Vitamin (MULTIVITAMIN WITH MINERALS) TABS Take 1 tablet by mouth daily.    [provider]  rosuvastatin (CRESTOR) 10 MG tablet Take 1 tablet (10 mg total) by  mouth daily. 12/10/20   Just, Laurita Quint, FNP      Allergies    Patient has no known allergies.    Review of Systems   Review of Systems  Cardiovascular:  Positive for syncope.    Physical Exam Updated Vital Signs BP (!) 163/84   Pulse 72   Temp 97.6 F (36.4 C)   Resp 17   SpO2 100%  Physical Exam Vitals and nursing note reviewed.  Constitutional:      Appearance: She is well-developed. She is not diaphoretic.  HENT:     Head: Normocephalic and atraumatic.     Right Ear: External ear normal.     Left Ear: External ear normal.  Eyes:     General: No scleral icterus.       Right eye: No discharge.        Left eye: No discharge.      Conjunctiva/sclera: Conjunctivae normal.  Neck:     Trachea: No tracheal deviation.  Cardiovascular:     Rate and Rhythm: Normal rate and regular rhythm.  Pulmonary:     Effort: Pulmonary effort is normal. No respiratory distress.     Breath sounds: Normal breath sounds. No stridor. No wheezing or rales.  Abdominal:     General: Bowel sounds are normal. There is no distension.     Palpations: Abdomen is soft.     Tenderness: There is no abdominal tenderness. There is no guarding or rebound.  Musculoskeletal:        General: No tenderness or deformity.     Cervical back: Neck supple.  Skin:    General: Skin is warm and dry.     Findings: No rash.  Neurological:     General: No focal deficit present.     Mental Status: She is alert.     Cranial Nerves: No cranial nerve deficit, dysarthria or facial asymmetry.     Sensory: No sensory deficit.     Motor: No abnormal muscle tone or seizure activity.     Coordination: Coordination normal.     Comments: Able to lift both arms and legs  Psychiatric:        Mood and Affect: Mood normal.     ED Results / Procedures / Treatments   Labs (all labs ordered are listed, but only abnormal results are displayed) Labs Reviewed  BASIC METABOLIC PANEL - Abnormal; Notable for the following components:      Result Value   Glucose, Bld 101 (*)    Calcium 8.4 (*)    All other components within normal limits  CBC WITH DIFFERENTIAL/PLATELET - Abnormal; Notable for the following components:   RBC 3.27 (*)    Hemoglobin 7.5 (*)    HCT 23.7 (*)    MCV 72.5 (*)    MCH 22.9 (*)    RDW 15.9 (*)    Platelets 437 (*)    All other components within normal limits  CBG MONITORING, ED  TYPE AND SCREEN  PREPARE RBC (CROSSMATCH)  TROPONIN I (HIGH SENSITIVITY)  TROPONIN I (HIGH SENSITIVITY)    EKG EKG Interpretation  Date/Time:  Thursday November 17 2022 13:08:08 EST Ventricular Rate:  72 PR Interval:  210 QRS Duration: 89 QT Interval:  387 QTC  Calculation: 424 R Axis:   64 Text Interpretation: Sinus rhythm Prominent P waves, nondiagnostic Abnormal R-wave progression, early transition No significant change since last tracing Confirmed by Dorie Rank [phone]) on 11/17/2022 1:35:20 PM  Radiology DG Hip Kaylyn Layer Viona Gilmore  or Wo Pelvis 2-3 Views Left  Result Date: 11/17/2022 CLINICAL DATA:  Syncope.  Pain. EXAM: DG HIP (WITH OR WITHOUT PELVIS) 2-3V LEFT COMPARISON:  01/04/2022 FINDINGS: There is no evidence of hip fracture or dislocation. There is no evidence of arthropathy or other focal bone abnormality. IMPRESSION: Negative. Electronically Signed   By: Rolm Baptise M.D.   On: 11/17/2022 14:45    Procedures Procedures    Medications Ordered in ED Medications  0.9 %  sodium chloride infusion (Manually program via Guardrails IV Fluids) (has no administration in time range)  sodium chloride 0.9 % bolus 500 mL (0 mLs Intravenous Stopped 11/17/22 1417)  acetaminophen (TYLENOL) tablet 650 mg (650 mg Oral Given 11/17/22 1417)    ED Course/ Medical Decision Making/ A&P Clinical Course as of 11/17/22 1628  Thu Nov 17, 2022  1346 Discussed case with Dr Harrington Challenger. [JK]  1455 X-rays without acute finding [JK]  1505 Reviewed case again with Dr. Harrington Challenger.  Cardiology viewed her monitor.  No dysrhythmia or bradycardia.  Sinus rhythm noted only [JK]    Clinical Course User Index [JK] Dorie Rank, MD                             Medical Decision Making Problems Addressed: Anemia, unspecified type: acute illness or injury that poses a threat to life or bodily functions Syncope, unspecified syncope type: acute illness or injury that poses a threat to life or bodily functions  Amount and/or Complexity of Data Reviewed Labs: ordered. Decision-making details documented in ED Course. Radiology: ordered and independent interpretation performed. ECG/medicine tests: ordered.  Risk OTC drugs. Prescription drug management. Decision regarding  hospitalization.    Patient presented to the ED for recurrent syncope.  Patient has had recurrent episodes over the last week or 2.  Patient was evaluated by her cardiologist and recommendation was for outpatient cardiac monitoring as well as repeat cardiac echo and carotid Dopplers.  Patient has this scheduled but has not been able to have that performed.  We did review her cardiac monitoring and there were no events noted.  Patient hemoglobin remains low.  She denies any active bleeding and it is stable but it is possible that could be contributing to her symptoms.  With her cardiac history I do think she would benefit from a unit of blood.  Discussed options of continued outpatient follow-up versus admission to the hospital to expedite her echo and carotid Dopplers considering the recurrent nature of her symptoms.  I will consult the medical service to discuss admission for further evaluation of her recurrent syncope.  Case discussed with Dr Marcello Moores regarding admission       Final Clinical Impression(s) / ED Diagnoses Final diagnoses:  Syncope, unspecified syncope type  Anemia, unspecified type    Rx / DC Orders ED Discharge Orders     None         Dorie Rank, MD 11/18/22 [phone]

## 2022-11-17 NOTE — Telephone Encounter (Signed)
Patient admitted to ED with fall. Was wearing heart monitor. Advised that monitor techs checked with preventice, patient in NSR at time of fall. These findings were reported to Dr. Harrington Challenger.

## 2022-11-17 NOTE — ED Notes (Signed)
MRI will get patient in 20 mins

## 2022-11-17 NOTE — H&P (Addendum)
History and Physical    Anna Bernard [id number] DOB: 1951-07-22 DOA: 11/17/2022  PCP: [name], MD  Patient coming from: home  I have personally briefly reviewed patient's old medical records in [place]  Chief Complaint: recurrent syncope   HPI: Anna Bernard is a 72 y.o. female with medical history significant of  hyperlipidemia , thyroid disease,hypertension, diabetes, Parkinson's disease, vitamin B12 deficiency, microcytic anemia, chronic low back pain, and multiple falls, paroxysmal atrial fibrillation , Pulmonary Emboli on Eliquis, hx of recurrent UTI ESBL who most recently has had multiple near syncopal episodes and home and fall.  Due to these episodes patient followed up with her cardiologist and was place on event monitor with plans for repeat echo. Patient however today hand another episode of syncope and presented to ED.  Patient noted no injuries. She states prior to episode she feels her heart racing and becomes short of breath. She notes no associated chest pain. Patient of note was wearing her event monitor when episode occurred , however per EDP - review with cardiologist noted no arrhythmia.  Patient currently , noted fatigue , but notes no chest pain , or sob currently no fever/chills/  ED Course:  In ED  Vitals: afeb, bp165/70, hr 73 , rr 19 sat 99%  EKG: sinus rhythm  Labs: wbc 6.6 , hgb 7.5 down from 10.3 ( 2023),mcv 72.5 Cr 0.74,  Ce 4,4 Tx : ns 500 cc x 1, prbc x 1 UA: + many bacteria wbc 0-5 Cxr:nad Iron 25, tibc 469, irons sat 5 , ferritin 6 MRI /CT head: NAD Review of Systems: As per HPI otherwise 10 point review of systems negative.   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[place])    possible by duplex 2023   Vitamin B12 deficiency     Past Surgical History:  Procedure Laterality Date   ABDOMINAL HYSTERECTOMY     GIVENS CAPSULE STUDY N/A 04/16/2018   Procedure: GIVENS CAPSULE STUDY;  Surgeon: Carol Ada, MD;  Location: [facility];  Service: Endoscopy;  Laterality: N/A;   PARS PLANA VITRECTOMY Right 08/16/2020   Procedure: 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;  Surgeon: Jalene Mullet, MD;  Location: [facility];  Service: Ophthalmology;  Laterality: Right;   PARS PLANA VITRECTOMY Right 09/29/2020   Procedure: PARS PLANA VITRECTOMY WITH 25 GAUGE - ENDOCAUTRY;  Surgeon: Jalene Mullet, MD;  Location: [facility];  Service: Ophthalmology;  Laterality: Right;   PHOTOCOAGULATION WITH LASER Right 08/16/2020   Procedure: PHOTOCOAGULATION WITH LASER;  Surgeon: Jalene Mullet, MD;  Location: [facility];  Service: Ophthalmology;  Laterality: Right;   PHOTOCOAGULATION WITH LASER Right 09/29/2020   Procedure: PHOTOCOAGULATION WITH LASER;  Surgeon: Jalene Mullet, MD;  Location: [facility];  Service: Ophthalmology;  Laterality: Right;   SHOULDER SURGERY     TONSILLECTOMY     TUBAL LIGATION       reports that she has quit smoking. She has been exposed to tobacco smoke. She has never used smokeless tobacco. She reports that she does not drink alcohol and does not use drugs.  No Known Allergies  Family History  Problem Relation Age  of Onset   Renal Disease Sister    Cancer Sister     Prior to Admission medications   Medication Sig Start Date End Date Taking? Authorizing Provider  Carbidopa-Levodopa ER (SINEMET CR) 25-100 MG tablet controlled release TAKE 3 TABS AT 7 AM, 2 TABS AT 11AM, 2 TABS AT 4PM Patient taking differently: 2-3 tablets See admin instructions. Take 3 tablets by mouth at 7 AM, 2 tablets at 11 AM, and 2 tablets at 4 PM 08/16/22  Yes Tat, Wells Guiles S, DO  diclofenac Sodium (VOLTAREN) 1 % GEL APPLY 4 G TOPICALLY 4  TIMES A DAY Patient taking differently: Apply 2 g topically 4 (four) times daily as needed (for pain- affected sites). 04/29/22  Yes Pray, Norwood Levo, MD  famotidine (PEPCID) 20 MG tablet TAKE 1 TABLET BY MOUTH EVERY DAY Patient taking differently: Take 20 mg by mouth daily as needed for heartburn or indigestion. 08/29/22  Yes Pray, Norwood Levo, MD  ferrous sulfate 325 (65 FE) MG tablet TAKE 1 TABLET BY MOUTH EVERY DAY WITH BREAKFAST Patient taking differently: Take 325 mg by mouth daily with breakfast. 01/04/22  Yes Pray, Norwood Levo, MD  levothyroxine (SYNTHROID) 100 MCG tablet TAKE 1 TABLET BY MOUTH EVERY MORNING 30 MINUTES BEFORE FOOD Patient taking differently: Take 100 mcg by mouth daily before breakfast. 06/27/22  Yes Pray, Norwood Levo, MD  lidocaine (LIDODERM) 5 % Place 1 patch onto the skin daily. Remove & Discard patch within 12 hours or as directed by MD Patient taking differently: Place 1 patch onto the skin daily as needed (for pain- Remove & Discard patch within 12 hours or as directed by MD). 01/19/22  Yes Gerrit Heck, MD  melatonin 3 MG TABS tablet Take 3 mg by mouth at bedtime as needed (For sleep). 01/24/22  Yes [provider]  metoprolol tartrate (LOPRESSOR) 25 MG tablet TAKE 1/2 TABLET TWICE A DAY BY MOUTH Patient taking differently: Take 12.5 mg by mouth 2 (two) times daily. 10/28/22  Yes Pray, Norwood Levo, MD  Multiple Vitamin (MULTIVITAMIN WITH MINERALS) TABS Take 1 tablet by mouth daily with breakfast.   Yes [provider]  TYLENOL 8 HOUR ARTHRITIS PAIN 650 MG CR tablet Take 1,300 mg by mouth every 8 (eight) hours as needed for pain.   Yes [provider]  acetaminophen (TYLENOL) 325 MG tablet Take 2 tablets (650 mg total) by mouth every 6 (six) hours as needed for mild pain or moderate pain. Patient not taking: Reported on 11/17/2022 01/19/22   Gerrit Heck, MD  apixaban (ELIQUIS) 5 MG TABS tablet Take 1 tablet (5 mg total) by mouth 2 (two) times daily.  Start after treatment course of eliquis completed (10 mg BID eliquis course for 6 additional days) Patient not taking: Reported on 11/17/2022 04/29/22   [name], MD  ferrous sulfate 324 (65 Fe) MG TBEC 1 daily Patient not taking: Reported on 11/17/2022 11/15/22   [name], MD  rosuvastatin (CRESTOR) 10 MG tablet Take 1 tablet (10 mg total) by mouth daily. Patient not taking: Reported on 11/17/2022 12/10/20   JustLaurita Quint, FNP    Physical Exam: Vitals:   11/17/22 1308 11/17/22 1400  BP: (!) 165/70 (!) 163/84  Pulse: 73 72  Resp: 19 17  Temp: 97.6 F (36.4 C) 98 F (36.7 C)  SpO2: 99% 100%    Constitutional: NAD, calm, comfortable Vitals:   11/17/22 1308 11/17/22 1400  BP: (!) 165/70 (!) 163/84  Pulse: 73 72  Resp: 19 17  Temp: 97.6 F (36.4 C) 98 F (36.7 C)  SpO2: 99% 100%   Eyes: PERRL, lids and conjunctivae normal ENMT: Mucous membranes are moist. Posterior pharynx clear of any exudate or lesions.Normal dentition.  Neck: normal, supple, no masses, no thyromegaly Respiratory: clear to auscultation bilaterally, no wheezing, no crackles. Normal respiratory effort. No accessory muscle use.  Cardiovascular: Regular rate and rhythm, no murmurs / rubs / gallops. No extremity edema. 2+ pedal pulses. No carotid bruits.  Abdomen: no tenderness, no masses palpated. No hepatosplenomegaly. Bowel sounds positive.  Musculoskeletal: no clubbing / cyanosis. No joint deformity upper and lower extremities. Good ROM, no contractures. Normal muscle tone.  Skin: no rashes, lesions, ulcers. No induration Neurologic: CN 2-12 grossly intact. Sensation intact, DTR normal. Strength 5/5 in all 4.  Psychiatric: Normal judgment and insight. Alert and oriented x 3. Normal mood.    Labs on Admission: I have personally reviewed following labs and imaging studies  CBC: Recent Labs  Lab 11/14/22 1411 11/17/22 1332  WBC 5.1 6.6  NEUTROABS  --  5.0  HGB 7.5* 7.5*  HCT 24.5* 23.7*   MCV 75* 72.5*  PLT 449 [id number]*   Basic Metabolic Panel: Recent Labs  Lab 11/14/22 1411 11/17/22 1332  NA 139 139  K 4.4 4.2  CL 106 108  CO2 23 25  GLUCOSE 85 101*  BUN 14 13  CREATININE 0.80 0.74  CALCIUM 8.9 8.4*   GFR: Estimated Creatinine Clearance: 59.7 mL/min (by C-G formula based on SCr of 0.74 mg/dL). Liver Function Tests: Recent Labs  Lab 11/14/22 1411  AST 13  ALT 5  ALKPHOS 73  BILITOT 0.2  PROT 6.8  ALBUMIN 3.8*   No results for input(s): "LIPASE", "AMYLASE" in the last 168 hours. No results for input(s): "AMMONIA" in the last 168 hours. Coagulation Profile: No results for input(s): "INR", "PROTIME" in the last 168 hours. Cardiac Enzymes: No results for input(s): "CKTOTAL", "CKMB", "CKMBINDEX", "TROPONINI" in the last 168 hours. BNP (last 3 results) No results for input(s): "PROBNP" in the last 8760 hours. HbA1C: No results for input(s): "HGBA1C" in the last 72 hours. CBG: Recent Labs  Lab 11/17/22 1329  GLUCAP 83   Lipid Profile: No results for input(s): "CHOL", "HDL", "LDLCALC", "TRIG", "CHOLHDL", "LDLDIRECT" in the last 72 hours. Thyroid Function Tests: No results for input(s): "TSH", "T4TOTAL", "FREET4", "T3FREE", "THYROIDAB" in the last 72 hours. Anemia Panel: No results for input(s): "VITAMINB12", "FOLATE", "FERRITIN", "TIBC", "IRON", "RETICCTPCT" in the last 72 hours. Urine analysis:    Component Value Date/Time   COLORURINE YELLOW 01/16/2022 0658   APPEARANCEUR HAZY (A) 01/16/2022 0658   APPEARANCEUR Cloudy (A) 09/02/2019 1043   LABSPEC 1.014 01/16/2022 0658   PHURINE 5.0 01/16/2022 0658   GLUCOSEU NEGATIVE 01/16/2022 0658   HGBUR SMALL (A) 01/16/2022 0658   BILIRUBINUR negative 09/16/2022 1635   BILIRUBINUR Negative 09/02/2019 1043   KETONESUR negative 09/16/2022 1635   KETONESUR 5 (A) 01/16/2022 0658   PROTEINUR =30 (A) 04/29/2022 1150   PROTEINUR 30 (A) 01/16/2022 0658   UROBILINOGEN 0.2 09/16/2022 1635   NITRITE Negative  09/16/2022 1635   NITRITE NEGATIVE 01/16/2022 0658   LEUKOCYTESUR Moderate (2+) (A) 09/16/2022 1635   LEUKOCYTESUR LARGE (A) 01/16/2022 0658    Radiological Exams on Admission: DG Hip Unilat W or Wo Pelvis 2-3 Views Left  Result Date: 11/17/2022 CLINICAL DATA:  Syncope.  Pain. EXAM: DG HIP (WITH OR WITHOUT PELVIS) 2-3V LEFT COMPARISON:  01/04/2022 FINDINGS: There is no  evidence of hip fracture or dislocation. There is no evidence of arthropathy or other focal bone abnormality. IMPRESSION: Negative. Electronically Signed   By: Rolm Baptise M.D.   On: 11/17/2022 14:45    EKG: Independently reviewed. See above  Assessment/Plan   Recurrent syncope/near syncope -per patient has sob and palpitations prior to episode -currently possible due to orthostasis related to volume depletion in setting of symptomatic anemia/ as well as hx of parkinson's -CTH/MRI negative  - echo/carotids pending to be complete    Symptomatic  Microcytic Anemia r/o Gi losses - patient also with noted drop in h/h and severe iron def with concern for occult blood loss -concern or possibly slow gi bleed  - labs note IDA ferritin  6  -s/p 1 PRBC in ED  -monitor h/h  -iron transfusion to be ordered once urine if urine culture negative -ppi bid  -gi consult   Hx of AfiB -on Eliquis hold due to anemia   Hx of PE On Eliquis  -non-occlusive PE right sided  DX 5/23 -hold Eliquis for now -resume AC base on gi recs   Hx of recurrent ESBL UTI -current UA + nitrate , no wbc -will send urine culture to be complete  Parkinson's disease, -resume home regimen sinemet  Hypertension -hold olmesartan, metoprolol due to syncopal episode  -check orthostatic in am , if stable can consider restarting home regimen  -ANTI- htn  medications prn   Hyperlipidemia  -continue statin    Hypothyroidism  -resume synthroid  -check tsh   Diet Controlled DMII  -poc glucose tid ac  DVT prophylaxis: scd Code Status: full/ as  discussed per patient wishes in event of cardiac arrest  Family Communication: none at bedside  Disposition Plan: patient  expected to be admitted greater than 2 midnights  Consults called:  GI  Admission status: progressive   Clance Boll MD [facility]  If 7PM-7AM, please contact night-coverage [email or web address] Password [facility]  11/17/2022, 5:02 PM

## 2022-11-17 NOTE — ED Triage Notes (Signed)
BIBA from home for syncopal episode in shower.  Assisted to ground by family. Denies hitting head.  Denis sob, cp, dizziness. Denies rectal bleeidng Stopped eliquis on Tuesday.  Hemoblogin on 11/14/22 was 7.5

## 2022-11-17 NOTE — H&P (Incomplete)
History and Physical    Anna Bernard [id number] DOB: 1951/02/22 DOA: 11/17/2022  PCP: [name], MD  Patient coming from: home  I have personally briefly reviewed patient's old medical records in [place]  Chief Complaint: recurrent syncope   HPI: Anna Bernard is a 72 y.o. female with medical history significant of  hyperlipidemia , thyroid disease,hypertension, diabetes, Parkinson's disease, vitamin B12 deficiency, microcytic anemia, chronic low back pain, and multiple falls, paroxysmal atrial fibrillation , Pulmonary Emboli on Eliquis, hx of recurrent UTI ESBL who most recently has had multiple near syncopal episodes and home and fall.  Due to these episodes patient followed up with her cardiologist and was place on event monitor with plans for repeat echo. Patient however today hand another episode of syncope and presented to ED.  Patient noted no injuries. She states prior to episode she feels her heart racing and becomes short of breath. She notes no associated chest pain. Patient of note was wearing her event monitor when episode occurred , however per EDP - review with cardiologist noted no arrhythmia.  Patient currently , noted fatigue , but notes no chest pain , or sob currently no fever/chills/  ED Course:  In ED  Vitals: afeb, bp165/70, hr 73 , rr 19 sat 99%  EKG: sinus rhythm  Labs: wbc 6.6 , hgb 7.5 down from 10.3 ( 2023),mcv 72.5 Cr 0.74,  Ce 4,4 Tx : ns 500 cc x 1, prbc x 1 UA: + many bacteria wbc 0-5 Cxr:nad Iron 25, tibc 469, irons sat 5 , ferritin 6 MRI /CT head: NAD Review of Systems: As per HPI otherwise 10 point review of systems negative.   Past Medical History:  Diagnosis Date  . Anemia, unspecified   . Carotid artery disease ([place])   . Hyperlipidemia, unspecified   . Hypertension   . Hypothyroidism, unspecified   . Mild CAD   . PAF (paroxysmal atrial fibrillation) ([place])   . Pre-diabetes   . Pulmonary emboli ([place])   . 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 Vitamin B12 deficiency     Past Surgical History:  Procedure Laterality Date  . ABDOMINAL HYSTERECTOMY    . GIVENS CAPSULE STUDY N/A 04/16/2018   Procedure: GIVENS CAPSULE STUDY;  Surgeon: Carol Ada, MD;  Location: [facility];  Service: Endoscopy;  Laterality: N/A;  . PARS PLANA VITRECTOMY Right 08/16/2020   Procedure: 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;  Surgeon: Jalene Mullet, MD;  Location: [facility];  Service: Ophthalmology;  Laterality: Right;  . PARS PLANA VITRECTOMY Right 09/29/2020   Procedure: PARS PLANA VITRECTOMY WITH 25 GAUGE - ENDOCAUTRY;  Surgeon: Jalene Mullet, MD;  Location: [facility];  Service: Ophthalmology;  Laterality: Right;  . PHOTOCOAGULATION WITH LASER Right 08/16/2020   Procedure: PHOTOCOAGULATION WITH LASER;  Surgeon: Jalene Mullet, MD;  Location: [facility];  Service: Ophthalmology;  Laterality: Right;  . PHOTOCOAGULATION WITH LASER Right 09/29/2020   Procedure: PHOTOCOAGULATION WITH LASER;  Surgeon: Jalene Mullet, MD;  Location: [facility];  Service: Ophthalmology;  Laterality: Right;  . SHOULDER SURGERY    . TONSILLECTOMY    . TUBAL LIGATION       reports that she has quit smoking. She has been exposed to tobacco smoke. She has never used smokeless tobacco. She reports that she does not drink alcohol and does not use drugs.  No Known Allergies  Family History  Problem Relation Age  of Onset  . Renal Disease Sister   . Cancer Sister     Prior to Admission medications   Medication Sig Start Date End Date Taking? Authorizing Provider  Carbidopa-Levodopa ER (SINEMET CR) 25-100 MG tablet controlled release TAKE 3 TABS AT 7 AM, 2 TABS AT 11AM, 2 TABS AT 4PM Patient taking differently: 2-3 tablets See admin instructions. Take 3 tablets by mouth at 7 AM, 2 tablets at 11 AM, and 2 tablets at 4 PM 08/16/22  Yes Tat, Wells Guiles S, DO  diclofenac Sodium (VOLTAREN) 1 % GEL APPLY 4 G  TOPICALLY 4 TIMES A DAY Patient taking differently: Apply 2 g topically 4 (four) times daily as needed (for pain- affected sites). 04/29/22  Yes Pray, Norwood Levo, MD  famotidine (PEPCID) 20 MG tablet TAKE 1 TABLET BY MOUTH EVERY DAY Patient taking differently: Take 20 mg by mouth daily as needed for heartburn or indigestion. 08/29/22  Yes Pray, Norwood Levo, MD  ferrous sulfate 325 (65 FE) MG tablet TAKE 1 TABLET BY MOUTH EVERY DAY WITH BREAKFAST Patient taking differently: Take 325 mg by mouth daily with breakfast. 01/04/22  Yes Pray, Norwood Levo, MD  levothyroxine (SYNTHROID) 100 MCG tablet TAKE 1 TABLET BY MOUTH EVERY MORNING 30 MINUTES BEFORE FOOD Patient taking differently: Take 100 mcg by mouth daily before breakfast. 06/27/22  Yes Pray, Norwood Levo, MD  lidocaine (LIDODERM) 5 % Place 1 patch onto the skin daily. Remove & Discard patch within 12 hours or as directed by MD Patient taking differently: Place 1 patch onto the skin daily as needed (for pain- Remove & Discard patch within 12 hours or as directed by MD). 01/19/22  Yes Gerrit Heck, MD  melatonin 3 MG TABS tablet Take 3 mg by mouth at bedtime as needed (For sleep). 01/24/22  Yes [provider]  metoprolol tartrate (LOPRESSOR) 25 MG tablet TAKE 1/2 TABLET TWICE A DAY BY MOUTH Patient taking differently: Take 12.5 mg by mouth 2 (two) times daily. 10/28/22  Yes Pray, Norwood Levo, MD  Multiple Vitamin (MULTIVITAMIN WITH MINERALS) TABS Take 1 tablet by mouth daily with breakfast.   Yes [provider]  TYLENOL 8 HOUR ARTHRITIS PAIN 650 MG CR tablet Take 1,300 mg by mouth every 8 (eight) hours as needed for pain.   Yes [provider]  acetaminophen (TYLENOL) 325 MG tablet Take 2 tablets (650 mg total) by mouth every 6 (six) hours as needed for mild pain or moderate pain. Patient not taking: Reported on 11/17/2022 01/19/22   Gerrit Heck, MD  apixaban (ELIQUIS) 5 MG TABS tablet Take 1 tablet (5 mg total) by mouth 2 (two)  times daily. Start after treatment course of eliquis completed (10 mg BID eliquis course for 6 additional days) Patient not taking: Reported on 11/17/2022 04/29/22   [name], MD  ferrous sulfate 324 (65 Fe) MG TBEC 1 daily Patient not taking: Reported on 11/17/2022 11/15/22   [name], MD  rosuvastatin (CRESTOR) 10 MG tablet Take 1 tablet (10 mg total) by mouth daily. Patient not taking: Reported on 11/17/2022 12/10/20   JustLaurita Quint, FNP    Physical Exam: Vitals:   11/17/22 1308 11/17/22 1400  BP: (!) 165/70 (!) 163/84  Pulse: 73 72  Resp: 19 17  Temp: 97.6 F (36.4 C) 98 F (36.7 C)  SpO2: 99% 100%    Constitutional: NAD, calm, comfortable Vitals:   11/17/22 1308 11/17/22 1400  BP: (!) 165/70 (!) 163/84  Pulse: 73 72  Resp: 19 17  Temp: 97.6 F (36.4 C) 98 F (36.7 C)  SpO2: 99% 100%   Eyes: PERRL, lids and conjunctivae normal ENMT: Mucous membranes are moist. Posterior pharynx clear of any exudate or lesions.Normal dentition.  Neck: normal, supple, no masses, no thyromegaly Respiratory: clear to auscultation bilaterally, no wheezing, no crackles. Normal respiratory effort. No accessory muscle use.  Cardiovascular: Regular rate and rhythm, no murmurs / rubs / gallops. No extremity edema. 2+ pedal pulses. No carotid bruits.  Abdomen: no tenderness, no masses palpated. No hepatosplenomegaly. Bowel sounds positive.  Musculoskeletal: no clubbing / cyanosis. No joint deformity upper and lower extremities. Good ROM, no contractures. Normal muscle tone.  Skin: no rashes, lesions, ulcers. No induration Neurologic: CN 2-12 grossly intact. Sensation intact, DTR normal. Strength 5/5 in all 4.  Psychiatric: Normal judgment and insight. Alert and oriented x 3. Normal mood.    Labs on Admission: I have personally reviewed following labs and imaging studies  CBC: Recent Labs  Lab 11/14/22 1411 11/17/22 1332  WBC 5.1 6.6  NEUTROABS  --  5.0  HGB 7.5* 7.5*  HCT  24.5* 23.7*  MCV 75* 72.5*  PLT 449 [id number]*   Basic Metabolic Panel: Recent Labs  Lab 11/14/22 1411 11/17/22 1332  NA 139 139  K 4.4 4.2  CL 106 108  CO2 23 25  GLUCOSE 85 101*  BUN 14 13  CREATININE 0.80 0.74  CALCIUM 8.9 8.4*   GFR: Estimated Creatinine Clearance: 59.7 mL/min (by C-G formula based on SCr of 0.74 mg/dL). Liver Function Tests: Recent Labs  Lab 11/14/22 1411  AST 13  ALT 5  ALKPHOS 73  BILITOT 0.2  PROT 6.8  ALBUMIN 3.8*   No results for input(s): "LIPASE", "AMYLASE" in the last 168 hours. No results for input(s): "AMMONIA" in the last 168 hours. Coagulation Profile: No results for input(s): "INR", "PROTIME" in the last 168 hours. Cardiac Enzymes: No results for input(s): "CKTOTAL", "CKMB", "CKMBINDEX", "TROPONINI" in the last 168 hours. BNP (last 3 results) No results for input(s): "PROBNP" in the last 8760 hours. HbA1C: No results for input(s): "HGBA1C" in the last 72 hours. CBG: Recent Labs  Lab 11/17/22 1329  GLUCAP 83   Lipid Profile: No results for input(s): "CHOL", "HDL", "LDLCALC", "TRIG", "CHOLHDL", "LDLDIRECT" in the last 72 hours. Thyroid Function Tests: No results for input(s): "TSH", "T4TOTAL", "FREET4", "T3FREE", "THYROIDAB" in the last 72 hours. Anemia Panel: No results for input(s): "VITAMINB12", "FOLATE", "FERRITIN", "TIBC", "IRON", "RETICCTPCT" in the last 72 hours. Urine analysis:    Component Value Date/Time   COLORURINE YELLOW 01/16/2022 0658   APPEARANCEUR HAZY (A) 01/16/2022 0658   APPEARANCEUR Cloudy (A) 09/02/2019 1043   LABSPEC 1.014 01/16/2022 0658   PHURINE 5.0 01/16/2022 0658   GLUCOSEU NEGATIVE 01/16/2022 0658   HGBUR SMALL (A) 01/16/2022 0658   BILIRUBINUR negative 09/16/2022 1635   BILIRUBINUR Negative 09/02/2019 1043   KETONESUR negative 09/16/2022 1635   KETONESUR 5 (A) 01/16/2022 0658   PROTEINUR =30 (A) 04/29/2022 1150   PROTEINUR 30 (A) 01/16/2022 0658   UROBILINOGEN 0.2 09/16/2022 1635   NITRITE  Negative 09/16/2022 1635   NITRITE NEGATIVE 01/16/2022 0658   LEUKOCYTESUR Moderate (2+) (A) 09/16/2022 1635   LEUKOCYTESUR LARGE (A) 01/16/2022 0658    Radiological Exams on Admission: DG Hip Unilat W or Wo Pelvis 2-3 Views Left  Result Date: 11/17/2022 CLINICAL DATA:  Syncope.  Pain. EXAM: DG HIP (WITH OR WITHOUT PELVIS) 2-3V LEFT COMPARISON:  01/04/2022 FINDINGS: There is no  evidence of hip fracture or dislocation. There is no evidence of arthropathy or other focal bone abnormality. IMPRESSION: Negative. Electronically Signed   By: Rolm Baptise M.D.   On: 11/17/2022 14:45    EKG: Independently reviewed. See above  Assessment/Plan  Recurrent syncope/near syncope -in setting of  DVT prophylaxis: *** (Lovenox/Heparin/SCD's/anticoagulated/None (if comfort care) Code Status: *** (Full/Partial (specify details) Family Communication: *** (Specify name, relationship. Do not write "discussed with patient". Specify tel # if discussed over the phone) Disposition Plan: *** (specify when and where you expect patient to be discharged) Consults called: *** (with names) Admission status: *** (inpatient / obs / tele / medical floor / SDU)   Clance Boll MD [facility] Pager [phone]  If 7PM-7AM, please contact night-coverage [email or web address] Password [facility]  11/17/2022, 5:02 PM

## 2022-11-17 NOTE — ED Notes (Signed)
Blood consent signed in room in chart

## 2022-11-17 NOTE — Telephone Encounter (Signed)
Pt in the ED per Dr Harrington Challenger after having a syncopal episode in the shower... pt is Day 2 of her Event Monitor.. I spoke with Valetta Fuller and she is checking with [employer] to see what the monitor showed during the pts triggered event... no abnormal alert calls from [employer] today.

## 2022-11-17 NOTE — ED Notes (Signed)
Called 4W for purple man for hand off. Pt requesting lidocaine patch for hip pain MD aware

## 2022-11-17 NOTE — ED Notes (Signed)
C/o left hip pain md aware

## 2022-11-18 ENCOUNTER — Inpatient Hospital Stay (HOSPITAL_COMMUNITY): Payer: Medicare Other | Admitting: Anesthesiology

## 2022-11-18 ENCOUNTER — Inpatient Hospital Stay (HOSPITAL_BASED_OUTPATIENT_CLINIC_OR_DEPARTMENT_OTHER): Payer: Medicare Other

## 2022-11-18 ENCOUNTER — Encounter (HOSPITAL_COMMUNITY)
Admission: EM | Disposition: A | Discharge status: Home Health | Payer: Self-pay | Source: Home / Self Care | Attending: Emergency Medicine

## 2022-11-18 ENCOUNTER — Encounter (HOSPITAL_COMMUNITY): Payer: Self-pay | Admitting: Internal Medicine

## 2022-11-18 ENCOUNTER — Inpatient Hospital Stay (HOSPITAL_COMMUNITY): Payer: Medicare Other

## 2022-11-18 DIAGNOSIS — K3189 Other diseases of stomach and duodenum: Secondary | ICD-10-CM | POA: Diagnosis not present

## 2022-11-18 DIAGNOSIS — D500 Iron deficiency anemia secondary to blood loss (chronic): Secondary | ICD-10-CM

## 2022-11-18 DIAGNOSIS — Z87891 Personal history of nicotine dependence: Secondary | ICD-10-CM | POA: Diagnosis not present

## 2022-11-18 DIAGNOSIS — G20A1 Parkinson's disease without dyskinesia, without mention of fluctuations: Secondary | ICD-10-CM

## 2022-11-18 DIAGNOSIS — D509 Iron deficiency anemia, unspecified: Secondary | ICD-10-CM

## 2022-11-18 DIAGNOSIS — D649 Anemia, unspecified: Secondary | ICD-10-CM | POA: Diagnosis not present

## 2022-11-18 DIAGNOSIS — K31811 Angiodysplasia of stomach and duodenum with bleeding: Secondary | ICD-10-CM | POA: Diagnosis not present

## 2022-11-18 DIAGNOSIS — R627 Adult failure to thrive: Secondary | ICD-10-CM

## 2022-11-18 DIAGNOSIS — I10 Essential (primary) hypertension: Secondary | ICD-10-CM

## 2022-11-18 DIAGNOSIS — R55 Syncope and collapse: Secondary | ICD-10-CM

## 2022-11-18 DIAGNOSIS — I252 Old myocardial infarction: Secondary | ICD-10-CM | POA: Diagnosis not present

## 2022-11-18 DIAGNOSIS — I2510 Atherosclerotic heart disease of native coronary artery without angina pectoris: Secondary | ICD-10-CM

## 2022-11-18 DIAGNOSIS — E119 Type 2 diabetes mellitus without complications: Secondary | ICD-10-CM | POA: Diagnosis not present

## 2022-11-18 HISTORY — PX: ENTEROSCOPY: SHX5533

## 2022-11-18 HISTORY — PX: HOT HEMOSTASIS: SHX5433

## 2022-11-18 LAB — ECHOCARDIOGRAM COMPLETE
AR max vel: 1.81 cm2
AV Area VTI: 1.56 cm2
AV Area mean vel: 1.59 cm2
AV Mean grad: 14 mmHg
AV Peak grad: 23.2 mmHg
Ao pk vel: 2.41 m/s
Area-P 1/2: 4.21 cm2
Height: 61 in
MV M vel: 3.64 m/s
MV Peak grad: 52.9 mmHg
S' Lateral: 1.4 cm

## 2022-11-18 LAB — BRAIN NATRIURETIC PEPTIDE: B Natriuretic Peptide: 82.2 pg/mL (ref 0.0–100.0)

## 2022-11-18 LAB — TYPE AND SCREEN
ABO/RH(D): O POS
Antibody Screen: NEGATIVE
Unit division: 0

## 2022-11-18 LAB — HEMOGLOBIN AND HEMATOCRIT, BLOOD
HCT: 25.7 % — ABNORMAL LOW (ref 36.0–46.0)
HCT: 25.3 % — ABNORMAL LOW (ref 36.0–46.0)
Hemoglobin: 8.4 g/dL — ABNORMAL LOW (ref 12.0–15.0)
Hemoglobin: 8.3 g/dL — ABNORMAL LOW (ref 12.0–15.0)

## 2022-11-18 LAB — BPAM RBC
Blood Product Expiration Date: 202403312359
ISSUE DATE / TIME: 202402292149
Unit Type and Rh: 5100

## 2022-11-18 LAB — TSH: TSH: 0.62 u[IU]/mL (ref 0.350–4.500)

## 2022-11-18 LAB — GLUCOSE, CAPILLARY: Glucose-Capillary: 79 mg/dL (ref 70–99)

## 2022-11-18 SURGERY — ENTEROSCOPY
Anesthesia: Monitor Anesthesia Care

## 2022-11-18 MED ORDER — PROPOFOL 10 MG/ML IV BOLUS
INTRAVENOUS | Status: AC
  Filled 2022-11-18: qty 20

## 2022-11-18 MED ORDER — PANTOPRAZOLE SODIUM 40 MG IV SOLR
40.0000 mg | Freq: Two times a day (BID) | INTRAVENOUS | Status: DC
  Administered 2022-11-18 – 2022-11-19 (×4): 40 mg via INTRAVENOUS
  Filled 2022-11-18 (×4): qty 10

## 2022-11-18 MED ORDER — PROPOFOL 500 MG/50ML IV EMUL
INTRAVENOUS | Status: AC
  Filled 2022-11-18: qty 50

## 2022-11-18 MED ORDER — FOSFOMYCIN TROMETHAMINE 3 G PO PACK
3.0000 g | PACK | Freq: Once | ORAL | Status: AC
  Administered 2022-11-18: 3 g via ORAL
  Filled 2022-11-18: qty 3

## 2022-11-18 MED ORDER — CARBIDOPA-LEVODOPA ER 25-100 MG PO TBCR
3.0000 | EXTENDED_RELEASE_TABLET | Freq: Every day | ORAL | Status: DC
  Administered 2022-11-18 – 2022-11-20 (×3): 3 via ORAL
  Filled 2022-11-18 (×3): qty 3

## 2022-11-18 MED ORDER — SODIUM CHLORIDE 0.9 % IV SOLN
250.0000 mg | Freq: Every day | INTRAVENOUS | Status: AC
  Administered 2022-11-18 – 2022-11-19 (×2): 250 mg via INTRAVENOUS
  Filled 2022-11-18 (×2): qty 20

## 2022-11-18 MED ORDER — PHENYLEPHRINE 80 MCG/ML (10ML) SYRINGE FOR IV PUSH (FOR BLOOD PRESSURE SUPPORT)
PREFILLED_SYRINGE | INTRAVENOUS | Status: DC | PRN
  Administered 2022-11-18: 240 ug via INTRAVENOUS
  Administered 2022-11-18: 160 ug via INTRAVENOUS

## 2022-11-18 MED ORDER — CARBIDOPA-LEVODOPA ER 25-100 MG PO TBCR
2.0000 | EXTENDED_RELEASE_TABLET | ORAL | Status: DC
  Administered 2022-11-18 – 2022-11-20 (×4): 2 via ORAL
  Filled 2022-11-18 (×4): qty 2

## 2022-11-18 MED ORDER — SODIUM CHLORIDE 0.9 % IV SOLN: INTRAVENOUS | Status: AC

## 2022-11-18 MED ORDER — CARBIDOPA-LEVODOPA ER 25-100 MG PO TBCR: 2.0000 | EXTENDED_RELEASE_TABLET | Freq: Three times a day (TID) | ORAL | Status: DC

## 2022-11-18 MED ORDER — FERROUS SULFATE 325 (65 FE) MG PO TABS
325.0000 mg | ORAL_TABLET | Freq: Every day | ORAL | Status: DC
  Administered 2022-11-18 – 2022-11-20 (×3): 325 mg via ORAL
  Filled 2022-11-18 (×4): qty 1

## 2022-11-18 MED ORDER — PROPOFOL 10 MG/ML IV BOLUS
INTRAVENOUS | Status: DC | PRN
  Administered 2022-11-18: 10 mg via INTRAVENOUS

## 2022-11-18 MED ORDER — APIXABAN 5 MG PO TABS
5.0000 mg | ORAL_TABLET | Freq: Two times a day (BID) | ORAL | Status: DC
  Administered 2022-11-19 – 2022-11-20 (×3): 5 mg via ORAL
  Filled 2022-11-18 (×3): qty 1

## 2022-11-18 MED ORDER — PROPOFOL 500 MG/50ML IV EMUL
INTRAVENOUS | Status: DC | PRN
  Administered 2022-11-18: 125 ug/kg/min via INTRAVENOUS

## 2022-11-18 MED ORDER — SODIUM CHLORIDE 0.9 % IV SOLN: INTRAVENOUS | Status: DC

## 2022-11-18 MED ORDER — SENNOSIDES-DOCUSATE SODIUM 8.6-50 MG PO TABS
1.0000 | ORAL_TABLET | Freq: Two times a day (BID) | ORAL | Status: DC
  Administered 2022-11-18 – 2022-11-20 (×4): 1 via ORAL
  Filled 2022-11-18 (×4): qty 1

## 2022-11-18 MED ORDER — LEVOTHYROXINE SODIUM 100 MCG PO TABS
100.0000 ug | ORAL_TABLET | Freq: Every day | ORAL | Status: DC
  Administered 2022-11-18 – 2022-11-20 (×3): 100 ug via ORAL
  Filled 2022-11-18 (×3): qty 1

## 2022-11-18 MED ORDER — METOPROLOL TARTRATE 25 MG PO TABS
12.5000 mg | ORAL_TABLET | Freq: Two times a day (BID) | ORAL | Status: DC
  Administered 2022-11-18 – 2022-11-20 (×5): 12.5 mg via ORAL
  Filled 2022-11-18 (×5): qty 1

## 2022-11-18 MED ORDER — ADULT MULTIVITAMIN W/MINERALS CH
1.0000 | ORAL_TABLET | Freq: Every day | ORAL | Status: DC
  Administered 2022-11-18 – 2022-11-20 (×3): 1 via ORAL
  Filled 2022-11-18 (×3): qty 1

## 2022-11-18 MED ORDER — LACTATED RINGERS IV SOLN
INTRAVENOUS | Status: AC | PRN
  Administered 2022-11-18: 1000 mL via INTRAVENOUS

## 2022-11-18 NOTE — Progress Notes (Cosign Needed Addendum)
  Transition of Care [facility]) Screening Note   Patient Details  Name: MARETA MERVIS Date of Birth: 1951/04/09   [facility] CM/SW Contact:    Henrietta Dine, RN Phone Number: 11/18/2022, 9:38 AM  Transition of Care Department [facility]) has reviewed patient and no TOC needs have been identified at this time. We will continue to monitor patient advancement through interdisciplinary progression rounds. If new patient transition needs arise, please place a TOC consult.

## 2022-11-18 NOTE — Telephone Encounter (Signed)
Contacted  Dr. Harrington Challenger to advise that [employer] monitor was checked, no events noted at time of fall or ED admission.

## 2022-11-18 NOTE — Anesthesia Procedure Notes (Signed)
Procedure Name: MAC Date/Time: 11/18/2022 11:03 AM  Performed by: Niel Hummer, CRNAPre-anesthesia Checklist: Patient identified, Emergency Drugs available, Suction available and Patient being monitored Oxygen Delivery Method: Simple face mask

## 2022-11-18 NOTE — Progress Notes (Signed)
  Echocardiogram 2D Echocardiogram has been performed.  Wynelle Link 11/18/2022, 4:25 PM

## 2022-11-18 NOTE — Telephone Encounter (Signed)
See other telephone note from 2/29

## 2022-11-18 NOTE — Anesthesia Postprocedure Evaluation (Signed)
Anesthesia Post Note  Patient: Anna Bernard  Procedure(s) Performed: ENTEROSCOPY HOT HEMOSTASIS (ARGON PLASMA COAGULATION/BICAP)     Patient location during evaluation: Endoscopy Anesthesia Type: MAC Level of consciousness: awake and alert Pain management: pain level controlled Vital Signs Assessment: post-procedure vital signs reviewed and stable Respiratory status: spontaneous breathing and respiratory function stable Cardiovascular status: stable Postop Assessment: no apparent nausea or vomiting Anesthetic complications: no   No notable events documented.  Last Vitals:  Vitals:   11/18/22 1145 11/18/22 1155  BP: (!) 95/59 109/79  Pulse: 85 84  Resp: 17 16  Temp:    SpO2: 98% 99%    Last Pain:  Vitals:   11/18/22 1155  TempSrc:   PainSc: 0-No pain                 Evelyn Moch DANIEL

## 2022-11-18 NOTE — Care Management Obs Status (Signed)
[place] NOTIFICATION   Patient Details  Name: Anna Bernard MRN: [id number] Date of Birth: 02/04/1951   Medicare Observation Status Notification Given:  Yes    Henrietta Dine, RN 11/18/2022, 4:06 PM

## 2022-11-18 NOTE — Anesthesia Preprocedure Evaluation (Addendum)
Anesthesia Evaluation  Patient identified by MRN, date of birth, ID band Patient awake    Reviewed: Allergy & Precautions, NPO status , Patient's Chart, lab work & pertinent test results  History of Anesthesia Complications Negative for: history of anesthetic complications  Airway Mallampati: III  TM Distance: >3 FB Neck ROM: Full    Dental no notable dental hx. (+) Dental Advisory Given   Pulmonary former smoker   Pulmonary exam normal        Cardiovascular hypertension, Pt. on medications and Pt. on home beta blockers + CAD and + Past MI  Normal cardiovascular exam+ dysrhythmias Atrial Fibrillation   IMPRESSIONS     1. Left ventricular ejection fraction, by estimation, is 60 to 65%. The  left ventricle has normal function. The left ventricle demonstrates  regional wall motion abnormalities (see scoring diagram/findings for  description). There is mild left ventricular   hypertrophy. Left ventricular diastolic parameters were normal. There is  mild hypokinesis of the left ventricular, basal inferolateral wall.   2. Right ventricular systolic function is normal. The right ventricular  size is normal.   3. Left atrial size was moderately dilated.   4. The mitral valve is normal in structure. Trivial mitral valve  regurgitation. No evidence of mitral stenosis. Moderate mitral annular  calcification.   5. The aortic valve is normal in structure. There is mild calcification  of the aortic valve. Aortic valve regurgitation is trivial. Aortic valve  sclerosis is present, with no evidence of aortic valve stenosis.   6. The inferior vena cava is normal in size with <50% respiratory  variability, suggesting right atrial pressure of 8 mmHg.   7. Cannot exclude a small PFO.   Comparison(s): No significant change from prior study. Prior images  reviewed side by side. Mild hypokinesis of basal inferolateral wall seen  on current and  prior study, no visual change between studies.      Neuro/Psych negative neurological ROS     GI/Hepatic Neg liver ROS,GERD  ,,  Endo/Other  diabetes, Type 2Hypothyroidism    Renal/GU      Musculoskeletal   Abdominal   Peds  Hematology  (+) Blood dyscrasia, anemia   Anesthesia Other Findings   Reproductive/Obstetrics                             Anesthesia Physical Anesthesia Plan  ASA: 3  Anesthesia Plan: MAC   Post-op Pain Management: Minimal or no pain anticipated   Induction:   PONV Risk Score and Plan: 2 and Propofol infusion, Ondansetron and Treatment may vary due to age or medical condition  Airway Management Planned: Natural Airway and Simple Face Mask  Additional Equipment:   Intra-op Plan:   Post-operative Plan:   Informed Consent: I have reviewed the patients History and Physical, chart, labs and discussed the procedure including the risks, benefits and alternatives for the proposed anesthesia with the patient or authorized representative who has indicated his/her understanding and acceptance.     Dental advisory given  Plan Discussed with: Anesthesiologist and CRNA  Anesthesia Plan Comments:         Anesthesia Quick Evaluation

## 2022-11-18 NOTE — Consult Note (Signed)
Anna Bernard HPI: This is a 72 year old female with a PMH IDA, small bowel AVMs, Parkinson's disease, PE on Eliquis, hyperlipidemia, HTN, and paroxysmal afib admitted for falls.  The patient was reported to have multiple falls secondary to near syncope.  In the hospital she was noted to have a drop in her HGB down to 7.5 g/dL, but her baseline ranges from 8-10 g/dL.  She does not report any issues with melena, hematochezia, or hematemesis.  On 03/21/2018 the EGD and colonoscopy did not show any overtly bleeding sites, but there was the possibility of an atypical AVM in the descending colon.  A VCE was performed on 04/20/2018 and it was positive for several nonbleeding AVMs in the proximal small bowel.  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[place])    possible by duplex 2023   Vitamin B12 deficiency     Past Surgical History:  Procedure Laterality Date   ABDOMINAL HYSTERECTOMY     GIVENS CAPSULE STUDY N/A 04/16/2018   Procedure: GIVENS CAPSULE STUDY;  Surgeon: Carol Ada, MD;  Location: [facility];  Service: Endoscopy;  Laterality: N/A;   PARS PLANA VITRECTOMY Right 08/16/2020   Procedure: 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;  Surgeon: Jalene Mullet, MD;  Location: [facility];  Service: Ophthalmology;  Laterality: Right;   PARS PLANA VITRECTOMY Right 09/29/2020   Procedure: PARS PLANA VITRECTOMY WITH 25 GAUGE - ENDOCAUTRY;  Surgeon: Jalene Mullet, MD;  Location: [facility];  Service: Ophthalmology;  Laterality: Right;   PHOTOCOAGULATION WITH LASER Right 08/16/2020   Procedure: PHOTOCOAGULATION WITH LASER;  Surgeon: Jalene Mullet, MD;  Location: [facility];  Service: Ophthalmology;  Laterality:  Right;   PHOTOCOAGULATION WITH LASER Right 09/29/2020   Procedure: PHOTOCOAGULATION WITH LASER;  Surgeon: Jalene Mullet, MD;  Location: [facility];  Service: Ophthalmology;  Laterality: Right;   SHOULDER SURGERY     TONSILLECTOMY     TUBAL LIGATION      Family History  Problem Relation Age of Onset   Renal Disease Sister    Cancer Sister     Social History:  reports that she has quit smoking. She has been exposed to tobacco smoke. She has never used smokeless tobacco. She reports that she does not drink alcohol and does not use drugs.  Allergies: Not on File  Medications: Scheduled:  [MAR Hold] sodium chloride   Intravenous Once   [MAR Hold] Carbidopa-Levodopa ER  3 tablet Oral Q0600   And   [MAR Hold] Carbidopa-Levodopa ER  2 tablet Oral 2 times per day   [MAR Hold] ferrous sulfate  325 mg Oral Q breakfast   [MAR Hold] levothyroxine  100 mcg Oral Q0600   [MAR Hold] lidocaine  1 patch Transdermal Q24H   [MAR Hold] multivitamin with minerals  1 tablet Oral Q breakfast   [MAR Hold] pantoprazole (PROTONIX) IV  40 mg Intravenous Q12H   [MAR Hold] senna-docusate  1 tablet Oral BID   [MAR Hold] sodium chloride flush  3 mL Intravenous Q12H   Continuous:  sodium chloride      Results for orders placed or performed during the hospital encounter of 11/17/22 (from the  past 24 hour(s))  CBG monitoring, ED     Status: None   Collection Time: 11/17/22  1:29 PM  Result Value Ref Range   Glucose-Capillary 83 70 - 99 mg/dL  Basic metabolic panel     Status: Abnormal   Collection Time: 11/17/22  1:32 PM  Result Value Ref Range   Sodium 139 135 - 145 mmol/L   Potassium 4.2 3.5 - 5.1 mmol/L   Chloride 108 98 - 111 mmol/L   CO2 25 22 - 32 mmol/L   Glucose, Bld 101 (H) 70 - 99 mg/dL   BUN 13 8 - 23 mg/dL   Creatinine, Ser 0.74 0.44 - 1.00 mg/dL   Calcium 8.4 (L) 8.9 - 10.3 mg/dL   GFR, Estimated >60 >60 mL/min   Anion gap 6 5 - 15  CBC WITH DIFFERENTIAL     Status: Abnormal   Collection  Time: 11/17/22  1:32 PM  Result Value Ref Range   WBC 6.6 4.0 - 10.5 K/uL   RBC 3.27 (L) 3.87 - 5.11 MIL/uL   Hemoglobin 7.5 (L) 12.0 - 15.0 g/dL   HCT 23.7 (L) 36.0 - 46.0 %   MCV 72.5 (L) 80.0 - 100.0 fL   MCH 22.9 (L) 26.0 - 34.0 pg   MCHC 31.6 30.0 - 36.0 g/dL   RDW 15.9 (H) 11.5 - 15.5 %   Platelets 437 (H) 150 - 400 K/uL   nRBC 0.0 0.0 - 0.2 %   Neutrophils Relative % 75 %   Neutro Abs 5.0 1.7 - 7.7 K/uL   Lymphocytes Relative 16 %   Lymphs Abs 1.1 0.7 - 4.0 K/uL   Monocytes Relative 7 %   Monocytes Absolute 0.4 0.1 - 1.0 K/uL   Eosinophils Relative 1 %   Eosinophils Absolute 0.1 0.0 - 0.5 K/uL   Basophils Relative 1 %   Basophils Absolute 0.1 0.0 - 0.1 K/uL   Immature Granulocytes 0 %   Abs Immature Granulocytes 0.02 0.00 - 0.07 K/uL  Troponin I (High Sensitivity)     Status: None   Collection Time: 11/17/22  1:32 PM  Result Value Ref Range   Troponin I (High Sensitivity) 4 <18 ng/L  Troponin I (High Sensitivity)     Status: None   Collection Time: 11/17/22  3:52 PM  Result Value Ref Range   Troponin I (High Sensitivity) 4 <18 ng/L  Type and screen [place]     Status: None   Collection Time: 11/17/22  3:52 PM  Result Value Ref Range   ABO/RH(D) O POS    Antibody Screen NEG    Sample Expiration 11/20/2022,2359    Unit Number [id number]    Blood Component Type RED CELLS,LR    Unit division 00    Status of Unit ISSUED,FINAL    Transfusion Status OK TO TRANSFUSE    Crossmatch Result      Compatible Performed at [facility], [place] [address] [place], [place] [address]   Prepare RBC (crossmatch)     Status: None   Collection Time: 11/17/22  3:52 PM  Result Value Ref Range   Order Confirmation      ORDER PROCESSED BY BLOOD BANK Performed at [facility] [address] [place], [place] [address]   Urinalysis, Routine w reflex microscopic -Urine, Clean Catch     Status: Abnormal   Collection Time:  11/17/22  4:37 PM  Result Value Ref Range   Color, Urine YELLOW YELLOW  APPearance CLEAR CLEAR   Specific Gravity, Urine 1.005 1.005 - 1.030   pH 6.0 5.0 - 8.0   Glucose, UA NEGATIVE NEGATIVE mg/dL   Hgb urine dipstick SMALL (A) NEGATIVE   Bilirubin Urine NEGATIVE NEGATIVE   Ketones, ur 5 (A) NEGATIVE mg/dL   Protein, ur NEGATIVE NEGATIVE mg/dL   Nitrite POSITIVE (A) NEGATIVE   Leukocytes,Ua TRACE (A) NEGATIVE   RBC / HPF 0-5 0 - 5 RBC/hpf   WBC, UA 0-5 0 - 5 WBC/hpf   Bacteria, UA MANY (A) NONE SEEN   Squamous Epithelial / HPF 0-5 0 - 5 /HPF  Vitamin B12     Status: None   Collection Time: 11/17/22  5:39 PM  Result Value Ref Range   Vitamin B-12 305 180 - 914 pg/mL  Iron and TIBC     Status: Abnormal   Collection Time: 11/17/22  5:39 PM  Result Value Ref Range   Iron 25 (L) 28 - 170 ug/dL   TIBC 469 (H) 250 - 450 ug/dL   Saturation Ratios 5 (L) 10.4 - 31.8 %   UIBC 444 ug/dL  Ferritin     Status: Abnormal   Collection Time: 11/17/22  5:39 PM  Result Value Ref Range   Ferritin 6 (L) 11 - 307 ng/mL  Folate     Status: None   Collection Time: 11/17/22  5:40 PM  Result Value Ref Range   Folate 20.6 >5.9 ng/mL  D-dimer, quantitative     Status: Abnormal   Collection Time: 11/17/22  5:41 PM  Result Value Ref Range   D-Dimer, Quant 0.52 (H) 0.00 - 0.50 ug/mL-FEU  Reticulocytes     Status: Abnormal   Collection Time: 11/17/22  5:41 PM  Result Value Ref Range   Retic Ct Pct 1.3 0.4 - 3.1 %   RBC. 3.69 (L) 3.87 - 5.11 MIL/uL   Retic Count, Absolute 49.1 19.0 - 186.0 K/uL   Immature Retic Fract 16.4 (H) 2.3 - 15.9 %  Troponin I (High Sensitivity)     Status: None   Collection Time: 11/17/22  5:41 PM  Result Value Ref Range   Troponin I (High Sensitivity) 5 <18 ng/L  Troponin I (High Sensitivity)     Status: None   Collection Time: 11/17/22  7:34 PM  Result Value Ref Range   Troponin I (High Sensitivity) 4 <18 ng/L  Hemoglobin and hematocrit, blood     Status: Abnormal    Collection Time: 11/18/22  3:30 AM  Result Value Ref Range   Hemoglobin 8.4 (L) 12.0 - 15.0 g/dL   HCT 25.7 (L) 36.0 - 46.0 %  TSH     Status: None   Collection Time: 11/18/22  3:30 AM  Result Value Ref Range   TSH 0.620 0.350 - 4.500 uIU/mL  Brain natriuretic peptide     Status: None   Collection Time: 11/18/22  3:30 AM  Result Value Ref Range   B Natriuretic Peptide 82.2 0.0 - 100.0 pg/mL  Hemoglobin and hematocrit, blood     Status: Abnormal   Collection Time: 11/18/22  7:02 AM  Result Value Ref Range   Hemoglobin 8.3 (L) 12.0 - 15.0 g/dL   HCT 25.3 (L) 36.0 - 46.0 %  Glucose, capillary     Status: None   Collection Time: 11/18/22  7:04 AM  Result Value Ref Range   Glucose-Capillary 79 70 - 99 mg/dL     VAS US CAROTID  Result Date: 11/18/2022 Carotid Arterial Duplex Study  Patient Name:  Anna Bernard  Date of Exam:   11/18/2022 Medical Rec #: [id number]       Accession #:    [id number] Date of Birth: April 09, 1951        Patient Gender: F Patient Age:   40 years Exam Location:  [facility] Procedure:      VAS US CAROTID Referring Phys: Myles Rosenthal --------------------------------------------------------------------------------  Indications:       Syncope. Risk Factors:      Hypertension, hyperlipidemia, Diabetes. Comparison Study:  02/28/2022 - Right Carotid: Velocities in the right ICA are                    consistent with a 1-39% stenosis.                     Left Carotid: Velocities in the left ICA are consistent with                    a 1-39%                    stenosis.                     Vertebrals: Bilateral vertebral arteries demonstrate                    antegrade flow.                    Subclavians: Right subclavian artery flow was disturbed.                    Normal flow hemodynamics were seen in the left subclavian                    artery. Performing Technologist: Oliver Hum RVT  Examination Guidelines: A complete evaluation includes B-mode imaging, spectral  Doppler, color Doppler, and power Doppler as needed of all accessible portions of each vessel. Bilateral testing is considered an integral part of a complete examination. Limited examinations for reoccurring indications may be performed as noted.  Right Carotid Findings: +----------+--------+--------+--------+-----------------------+--------+           PSV cm/sEDV cm/sStenosisPlaque Description     Comments +----------+--------+--------+--------+-----------------------+--------+ CCA Prox  91      16                                              +----------+--------+--------+--------+-----------------------+--------+ CCA Distal99      22              smooth and heterogenous         +----------+--------+--------+--------+-----------------------+--------+ ICA Prox  108     23              smooth and heterogenous         +----------+--------+--------+--------+-----------------------+--------+ ICA Distal61      25                                     tortuous +----------+--------+--------+--------+-----------------------+--------+ ECA       145     16                                              +----------+--------+--------+--------+-----------------------+--------+ +----------+--------+-------+--------+-------------------+  PSV cm/sEDV cmsDescribeArm Pressure (mmHG) +----------+--------+-------+--------+-------------------+ Subclavian69                                         +----------+--------+-------+--------+-------------------+ +---------+--------+--+--------+--+---------+ VertebralPSV cm/s48EDV cm/s11Antegrade +---------+--------+--+--------+--+---------+  Left Carotid Findings: +----------+--------+--------+--------+-----------------------+--------+           PSV cm/sEDV cm/sStenosisPlaque Description     Comments +----------+--------+--------+--------+-----------------------+--------+ CCA Prox  135     21              smooth and  heterogenous         +----------+--------+--------+--------+-----------------------+--------+ CCA Distal116     27              smooth and heterogenous         +----------+--------+--------+--------+-----------------------+--------+ ICA Prox  98      23              smooth and heterogenous         +----------+--------+--------+--------+-----------------------+--------+ ICA Distal74      25                                     tortuous +----------+--------+--------+--------+-----------------------+--------+ ECA       148     25                                              +----------+--------+--------+--------+-----------------------+--------+ +----------+--------+--------+--------+-------------------+           PSV cm/sEDV cm/sDescribeArm Pressure (mmHG) +----------+--------+--------+--------+-------------------+ Subclavian200                                         +----------+--------+--------+--------+-------------------+ +---------+--------+--+--------+--+---------+ VertebralPSV cm/s60EDV cm/s16Antegrade +---------+--------+--+--------+--+---------+   Summary: Right Carotid: Velocities in the right ICA are consistent with a 1-39% stenosis. Left Carotid: Velocities in the left ICA are consistent with a 1-39% stenosis. Vertebrals: Bilateral vertebral arteries demonstrate antegrade flow. *See table(s) above for measurements and observations.     Preliminary    CT HEAD WO CONTRAST (5MM)  Result Date: 11/17/2022 CLINICAL DATA:  Initial evaluation for syncope/presyncope EXAM: CT HEAD WITHOUT CONTRAST TECHNIQUE: Contiguous axial images were obtained from the base of the skull through the vertex without intravenous contrast. RADIATION DOSE REDUCTION: This exam was performed according to the departmental dose-optimization program which includes automated exposure control, adjustment of the mA and/or kV according to patient size and/or use of iterative reconstruction  technique. COMPARISON:  Prior MRI from earlier the same day. FINDINGS: Brain: Mild age-related cerebral atrophy with moderate chronic microvascular ischemic disease. No acute intracranial hemorrhage. No acute large vessel territory infarct. No mass lesion or midline shift. No hydrocephalus or extra-axial fluid collection. Vascular: No abnormal hyperdense vessel. Scattered vascular calcifications noted within the carotid siphons. Skull: Scalp soft tissues and calvarium demonstrate no acute finding. Sinuses/Orbits: Globes orbital soft tissues demonstrate no acute finding. Paranasal sinuses and mastoid air cells are clear. Other: None. IMPRESSION: 1. No acute intracranial abnormality. 2. Mild age-related cerebral atrophy with moderate chronic microvascular ischemic disease. Electronically Signed   By: Jeannine Boga M.D.   On: 11/17/2022 20:00   MR BRAIN WO CONTRAST  Result Date: 11/17/2022  CLINICAL DATA:  Initial evaluation for syncope/presyncope, stroke suspected. EXAM: MRI HEAD WITHOUT CONTRAST TECHNIQUE: Multiplanar, multiecho pulse sequences of the brain and surrounding structures were obtained without intravenous contrast. COMPARISON:  Prior study from 01/16/2022. FINDINGS: Brain: Mild age-related cerebral atrophy. Patchy and confluent T2/FLAIR hyperintensity involving the periventricular deep white matter both cerebral hemispheres, consistent with chronic small vessel ischemic disease, moderate in nature. No evidence for acute or subacute ischemia. Gray-white matter differentiation maintained. No areas of chronic cortical infarction. No acute or chronic intracranial blood products. No mass lesion, midline shift or mass effect. No hydrocephalus or extra-axial fluid collection. Pituitary gland and suprasellar region within normal limits. Vascular: Major intracranial vascular flow voids are maintained. Skull and upper cervical spine: Craniocervical junction normal. Bone marrow signal intensity within  normal limits. No scalp soft tissue abnormality. Sinuses/Orbits: Prior ocular lens replacement on the right. Right gaze preference noted. Paranasal sinuses are clear. No mastoid effusion. Other: None. IMPRESSION: 1. No acute intracranial abnormality. 2. Mild age-related cerebral atrophy with moderate chronic microvascular ischemic disease. Electronically Signed   By: Jeannine Boga M.D.   On: 11/17/2022 19:58   DG CHEST PORT 1 VIEW  Result Date: 11/17/2022 CLINICAL DATA:  Syncope EXAM: PORTABLE CHEST 1 VIEW COMPARISON:  01/16/2022 FINDINGS: Cardiac size is within normal limits. Lung fields are clear of any infiltrates or pulmonary edema. There is no pleural effusion or pneumothorax. There is an electronic device superimposed over the superior mediastinum. Evaluation of this finding is limited in this single AP portable view. Degenerative changes are noted in both shoulders and both AC joints. Left hemidiaphragm is elevated with no interval change. IMPRESSION: There are no new infiltrates or signs of pulmonary edema. Electronically Signed   By: Elmer Picker M.D.   On: 11/17/2022 17:04   DG Hip Unilat W or Wo Pelvis 2-3 Views Left  Result Date: 11/17/2022 CLINICAL DATA:  Syncope.  Pain. EXAM: DG HIP (WITH OR WITHOUT PELVIS) 2-3V LEFT COMPARISON:  01/04/2022 FINDINGS: There is no evidence of hip fracture or dislocation. There is no evidence of arthropathy or other focal bone abnormality. IMPRESSION: Negative. Electronically Signed   By: Rolm Baptise M.D.   On: 11/17/2022 14:45    ROS:  As stated above in the HPI otherwise negative.  Blood pressure (!) 145/64, pulse 82, temperature 98.1 F (36.7 C), temperature source Temporal, resp. rate 20, height '5\' 1"'$  (1.549 m), SpO2 97 %.    PE: Gen: NAD, Oriented, masked facies HEENT:  [place]/AT, EOMI Lungs: CTA Bilaterally CV: RRR without M/G/R ABD: Soft, NTND, +BS Ext: No C/C/E  Assessment/Plan: 1) Anemia. 2) Small bowel AVMs. 3) PE on  Eliquis. 4) Multiple medical problems.   With her history of proximal small bowel AVMs, worsened anemia, and Eliquis for her history of PE, an enteroscopy with possible APC will be performed.  Kattaleya Alia D 11/18/2022, 10:50 AM

## 2022-11-18 NOTE — Op Note (Signed)
[facility] Patient Name: Anna Bernard Procedure Date: 11/18/2022 MRN: [id number] Attending MD: Carol Ada , MD, [id number] Date of Birth: 1951-04-03 CSN: [id number] Age: 72 Admit Type: Inpatient Procedure:                Small bowel enteroscopy Indications:              Iron deficiency anemia Providers:                Carol Ada, MD, Jamison Neighbor RN, RN, Brien Mates, Technician Referring MD:              Medicines:                Propofol per Anesthesia Complications:            No immediate complications. Estimated Blood Loss:     Estimated blood loss: none. Procedure:                Pre-Anesthesia Assessment:                           - Prior to the procedure, a History and Physical                            was performed, and patient medications and                            allergies were reviewed. The patient's tolerance of                            previous anesthesia was also reviewed. The risks                            and benefits of the procedure and the sedation                            options and risks were discussed with the patient.                            All questions were answered, and informed consent                            was obtained. Prior Anticoagulants: The patient has                            taken Eliquis (apixaban), last dose was 4 days                            prior to procedure. ASA Grade Assessment: III - A                            patient with severe systemic disease. After  reviewing the risks and benefits, the patient was                            deemed in satisfactory condition to undergo the                            procedure.                           - Sedation was administered by an anesthesia                            professional. Deep sedation was attained.                           After obtaining informed consent, the endoscope was                             passed under direct vision. Throughout the                            procedure, the patient's blood pressure, pulse, and                            oxygen saturations were monitored continuously. The                            [id number] Olympus colonoscope was                            introduced through the mouth and advanced to the                            proximal jejunum. The small bowel enteroscopy was                            accomplished without difficulty. The patient                            tolerated the procedure well. Scope In: Scope Out: Findings:      The esophagus was normal.      Patchy mildly erythematous mucosa with stigmata of recent bleeding was       found in the gastric fundus. Coagulation for tissue destruction using       monopolar probe was successful.      The examined duodenum was normal.      There was no evidence of significant pathology in the entire examined       portion of jejunum.      Two deep passes were made into the jejunum and slow careful examinations       of the mucosa were performed. There was no evidence of any AVMs and no       evidence of any bleeding, however, with reinspection of the gastric       lumen there was the possibility of AVMs. In the retroflexed position       there  was some evidence of hematin and several erythematous patches were       noted. These were ablated with APC. Impression:               - Normal esophagus.                           - Erythematous mucosa in the gastric fundus.                            Treated with a monopolar probe.                           - Normal examined duodenum.                           - The examined portion of the jejunum was normal.                           - No specimens collected. Recommendation:           - Return patient to hospital ward for ongoing care.                           - Regular diet.                           - Restart Eliquis  tomorrow.                           - Follow HGB and transfuse as necessary. Procedure Code(s):        --- Professional ---                           [id number], Small intestinal endoscopy, enteroscopy                            beyond second portion of duodenum, not including                            ileum; with control of bleeding (eg, injection,                            bipolar cautery, unipolar cautery, laser, heater                            probe, stapler, plasma coagulator) Diagnosis Code(s):        --- Professional ---                           K31.89, Other diseases of stomach and duodenum                           D50.9, Iron deficiency anemia, unspecified CPT copyright 2022 [employer]. All rights reserved. The codes documented in this report are preliminary and upon coder review may  be revised to meet current compliance requirements. Carol Ada, MD Carol Ada, MD  11/18/2022 11:33:46 AM This report has been signed electronically. Number of Addenda: 0

## 2022-11-18 NOTE — Transfer of Care (Signed)
Immediate Anesthesia Transfer of Care Note  Patient: Anna Bernard  Procedure(s) Performed: ENTEROSCOPY HOT HEMOSTASIS (ARGON PLASMA COAGULATION/BICAP)  Patient Location: Endoscopy Unit  Anesthesia Type:MAC  Level of Consciousness: oriented and patient cooperative  Airway & Oxygen Therapy: Patient Spontanous Breathing and Patient connected to face mask oxygen  Post-op Assessment: Report given to RN and Post -op Vital signs reviewed and stable  Post vital signs: Reviewed  Last Vitals:  Vitals Value Taken Time  BP    Temp    Pulse    Resp    SpO2      Last Pain:  Vitals:   11/18/22 1044  TempSrc: Temporal  PainSc: 0-No pain         Complications: No notable events documented.

## 2022-11-18 NOTE — Care Management CC44 (Signed)
Condition Code 44 Documentation Completed  Patient Details  Name: SEMONE SCHERER MRN: [id number] Date of Birth: [birth year]   Condition Code 44 given:  Yes Patient signature on Condition Code 44 notice:  Yes Documentation of 2 MD's agreement:  Yes Code 44 added to claim:  Yes    Henrietta Dine, RN 11/18/2022, 4:06 PM

## 2022-11-18 NOTE — Progress Notes (Signed)
OT Cancellation Note  Patient Details Name: Anna Bernard MRN: [id number] DOB: 1951-07-06   Cancelled Treatment:    Reason Eval/Treat Not Completed: Fatigue/lethargy limiting ability to participate;Patient's level of consciousness: Pt lethargic and seen by PT earlier today as well as Endoscopy. Given busy day, pt asking to sleep. Daughter in room and agreeable to allowing pt rest and OT checking back next service date as time allows.   Julien Girt 11/18/2022, 2:04 PM

## 2022-11-18 NOTE — Progress Notes (Signed)
PROGRESS NOTE    Anna Bernard  [id number] DOB: 1951-06-26 DOA: 11/17/2022 PCP: [name], MD     Brief Narrative:  H/o hypothyroidism, HTN, HLD, diet controlled DM2, b12 deficiency, Parkinson's disease, chronic low back pain, and multiple falls, paroxysmal atrial fibrillation , Pulmonary Emboli on Eliquis, hx of recurrent UTI ESBL who most recently has had multiple near syncopal episodes at home and fall.  Due to these episodes patient followed up with her cardiologist and was place on event monitor with plans for repeat echo. Patient however today hand another episode of syncope and presented to ED.  Patient noted no injuries. She states prior to episode she feels her heart racing and becomes short of breath. She notes no associated chest pain. Patient of note was wearing her event monitor when episode occurred , however per EDP - review with cardiologist noted no arrhythmia.  Patient currently , noted fatigue , but notes no chest pain , or sob currently no fever/chills/  EKG: sinus rhythm  Labs: wbc 6.6 , hgb 7.5 down from 10.3 ( 2023),mcv 72.5, Iron 25, tibc 469, irons sat 5 , ferritin 6 Cr 0.74,  UA: + many bacteria wbc 0-5 Cxr:nad MRI /CT head: NAD  Subjective:  She is seen after returned from EGD Feeling weak, no fever, reports dysuria, dark urine,  C/o left hip pain Husband at bedside   Assessment & Plan:  Principal Problem:   Syncope    Assessment and Plan:  Recurrent syncope/near syncope episodes -CTH/MRI negative  - echo/carotids pending to be complete  --per patient has sob and palpitations prior to episode -check orthostatic  -currently possible due to orthostasis related to volume depletion in setting of symptomatic anemia/ as well as hx of parkinson's/dysautonomia She is followed by cardiology and currently having event monitor on   Symptomatic, macrocytic anemia, severe iron deficiency,  S/p EGD /small bowel enteroscopy showed ".Patchy mildly  erythematous mucosa with stigmata of recent bleeding was found in the gastric fundus, Treated with a monopolar probe. Appreciate GI Dr Benson Norway input, Dr Benson Norway recommend resume eliquis from 3/2  History of A-fib and PE (non-occlusive PE right sided DX 5/23 )  held on admission, resume on 3/2 per gi recommendation  Left hip pain Reports  syncopal episode in shower on 2/29, Assisted to ground by family. Denies hitting head.  Left hip x  ray no acute findings,  Pain control,     Recurrent UTI/ESBL colonization  -UA positive nitrite, many bacteria -Reports dysuria, urinary frequency , urine appear cloudy -Urine culture pending -Case discussed with ID Dr. Graylon Good who recommended fosfomycin x 1 dose  Hypertension Blood pressure started to trend up, resume metoprolol    Hyperlipidemia  -continue statin     Hypothyroidism  -resume synthroid  -check tsh    Diet Controlled DMII  -poc glucose tid ac  Parkinson's disease, -resume home regimen sinemet  FTT, will get PT, likely will need snf  Nutritional Assessment: The patient's BMI is: Body mass index is 29.48 kg/m.Marland Kitchen Seen by dietician.  I agree with the assessment and plan as outlined below: Nutrition Status:        .  I have Reviewed nursing notes, Vitals, pain scores, I/o's, Lab results and  imaging results since pt's last encounter, details please see discussion above  I ordered the following labs:  Unresulted Labs (From admission, onward)     Start     Ordered   11/19/22 0500  CBC  Tomorrow morning,  R        11/18/22 1250   11/19/22 [id number]  Basic metabolic panel  Tomorrow morning,   R        11/18/22 1250   11/19/22 0500  Magnesium  Tomorrow morning,   R        11/18/22 1250   11/19/22 0500  Phosphorus  Tomorrow morning,   R        11/18/22 1250   11/18/22 0024  Culture, OB Urine  Once,   R        11/18/22 0023             DVT prophylaxis: SCDs Start: 11/17/22 1647 apixaban (ELIQUIS) tablet 5 mg   Code  Status:   Code Status: Full Code  Family Communication: husband at bedside  Disposition:   Dispo: The patient is from: home              Anticipated d/c is to: [facility] vs SNF, pending PT eval              Anticipated d/c date is: 24-48hrs  Antimicrobials:   Anti-infectives (From admission, onward)    Start     Dose/Rate Route Frequency Ordered Stop   11/18/22 1515  fosfomycin (MONUROL) packet 3 g        3 g Oral  Once 11/18/22 1421             Objective: Vitals:   11/18/22 1135 11/18/22 1145 11/18/22 1155 11/18/22 1318  BP: (!) 139/51 (!) 95/59 109/79 (!) 162/75  Pulse: 85 85 84 81  Resp: '16 17 16 '$ (!) 22  Temp: (!) 97.5 F (36.4 C)   98.2 F (36.8 C)  TempSrc: Temporal   Oral  SpO2: 98% 98% 99% 98%  Height:        Intake/Output Summary (Last 24 hours) at 11/18/2022 1428 Last data filed at 11/18/2022 1330 Gross per 24 hour  Intake 685.29 ml  Output 600 ml  Net 85.29 ml   There were no vitals filed for this visit.  Examination:  General exam: weak, but oriented  Respiratory system: Clear to auscultation. Respiratory effort normal. Cardiovascular system:  RRR.  Gastrointestinal system: Abdomen is nondistended, soft and nontender.  Normal bowel sounds heard. Central nervous system: Alert and oriented. No focal neurological deficits. Extremities:  no edema, c/o left hip pain,  Skin: No rashes, lesions or ulcers Psychiatry: Judgement and insight appear normal. Mood & affect appropriate.     Data Reviewed: I have personally reviewed  labs and visualized  imaging studies since the last encounter and formulate the plan        Scheduled Meds:  sodium chloride   Intravenous Once   [START ON 11/19/2022] apixaban  5 mg Oral BID   Carbidopa-Levodopa ER  3 tablet Oral Q0600   And   Carbidopa-Levodopa ER  2 tablet Oral 2 times per day   ferrous sulfate  325 mg Oral Q breakfast   fosfomycin  3 g Oral Once   levothyroxine  100 mcg Oral Q0600   lidocaine  1 patch  Transdermal Q24H   metoprolol tartrate  12.5 mg Oral BID   multivitamin with minerals  1 tablet Oral Q breakfast   pantoprazole (PROTONIX) IV  40 mg Intravenous Q12H   senna-docusate  1 tablet Oral BID   sodium chloride flush  3 mL Intravenous Q12H   Continuous Infusions:  ferric gluconate (FERRLECIT) IVPB       LOS: 1 day  Time spent:  24mns  FFlorencia Reasons MD PhD FACP [facility]  Available via Epic secure chat 7am-7pm for nonurgent issues Please page for urgent issues To page the attending provider between 7A-7P or the covering provider during after hours 7P-7A, please log into the web site [email or web address] and access using universal [place] password for that web site. If you do not have the password, please call the hospital operator.    11/18/2022, 2:28 PM

## 2022-11-18 NOTE — Evaluation (Signed)
Physical Therapy Evaluation Patient Details Name: Anna Bernard MRN: [id number] DOB: June 15, 1951 Today's Date: 11/18/2022  History of Present Illness  72 y.o. female with medical history significant of   hyperlipidemia , thyroid disease,hypertension, diabetes, Parkinson's disease, vitamin B12 deficiency, microcytic anemia, chronic low back pain, and multiple falls, paroxysmal atrial fibrillation , Pulmonary Emboli on Eliquis, hx of recurrent UTI ESBL who most recently has had multiple near syncopal episodes and home and fall.  Due to these episodes patient followed up with her cardiologist and was place on event monitor with plans for repeat echo. Patient however had another episode of syncope and presented to ED. Pt also with Symptomatic Microcytic Anemia and Gastroenterology following.  Clinical Impression  Pt admitted with above diagnosis.  Pt currently with functional limitations due to the deficits listed below (see PT Problem List). Pt will benefit from skilled PT to increase their independence and safety with mobility to allow discharge to the venue listed below.   Spouse present for session and reports he assists pt at home with transfers.  Pt typically not ambulating.  Pt provided inaccurate history.  Pt requiring increased assist for bed mobility and standing.  Pt not able to perform transfers today.  Endo arrived (impromptu) so spouse left with pt for explanation of procedure.  If spouse cannot provide current level of assist, pt may need SNF.  If pt returns home, would benefit from increased home care if possible.        Recommendations for follow up therapy are one component of a multi-disciplinary discharge planning process, led by the attending physician.  Recommendations may be updated based on patient status, additional functional criteria and insurance authorization.  Follow Up Recommendations Skilled nursing-short term rehab (<3 hours/day) Can patient physically be transported by  private vehicle: No    Assistance Recommended at Discharge Frequent or constant Supervision/Assistance  Patient can return home with the following  Two people to help with walking and/or transfers    Equipment Recommendations Hospital bed  Recommendations for Other Services       Functional Status Assessment Patient has had a recent decline in their functional status and demonstrates the ability to make significant improvements in function in a reasonable and predictable amount of time.     Precautions / Restrictions Precautions Precautions: Fall      Mobility  Bed Mobility Overal bed mobility: Needs Assistance Bed Mobility: Supine to Sit, Sit to Supine     Supine to sit: Max assist Sit to supine: Max assist, +2 for physical assistance   General bed mobility comments: pt initiated however unable to perform without significant assist, stiff movement, left hip pain    Transfers Overall transfer level: Needs assistance Equipment used: Rolling walker (2 wheels) Transfers: Sit to/from Stand Sit to Stand: Max assist, +2 physical assistance           General transfer comment: requiring increased assist to rise, not able to separate feet or lift LEs or put weight through UEs on RW so returned to sitting (endo arrived with transport chair however pt not able to transfer)    Ambulation/Gait                  Stairs            Wheelchair Mobility    Modified Rankin (Stroke Patients Only)       Balance Overall balance assessment: Needs assistance Sitting-balance support: Feet supported, Single extremity supported Sitting balance-Leahy Scale: Poor  Standing balance-Leahy Scale: Zero                               Pertinent Vitals/Pain Pain Assessment Pain Assessment: Faces Faces Pain Scale: Hurts little more Pain Location: left hip Pain Descriptors / Indicators: Sore, Guarding, Grimacing Pain Intervention(s): Repositioned,  Monitored during session    Home Living Family/patient expects to be discharged to:: Private residence Living Arrangements: Spouse/significant other   Type of Home: House Home Access: Ramped entrance       Home Layout: Two level;Full bath on main level Home Equipment: Rollator (4 wheels);Rolling Walker (2 wheels);BSC/3in1;Shower seat;Wheelchair - manual      Prior Function Prior Level of Function : Needs assist             Mobility Comments: spouse reports pt typically requires assist for bed mobility and transfers, limited with ambulation; pt provides inaccurate history ADLs Comments: Husband assist with shower transfers     Hand Dominance        Extremity/Trunk Assessment        Lower Extremity Assessment Lower Extremity Assessment: Generalized weakness;LLE deficits/detail LLE Deficits / Details: requiring more assist due to pain       Communication   Communication: No difficulties  Cognition Arousal/Alertness: Awake/alert Behavior During Therapy: Flat affect Overall Cognitive Status: History of cognitive impairments - at baseline                                 General Comments: hx Parkinsons, no documented dementia however pt reports inaccurate history as spouse corrected her        General Comments      Exercises     Assessment/Plan    PT Assessment Patient needs continued PT services  PT Problem List Decreased balance;Decreased mobility;Decreased strength;Decreased activity tolerance;Decreased coordination;Decreased knowledge of precautions;Decreased knowledge of use of DME;Pain       PT Treatment Interventions Gait training;DME instruction;Balance training;Therapeutic exercise;Functional mobility training;Therapeutic activities;Patient/family education;Wheelchair mobility training    PT Goals (Current goals can be found in the Care Plan section)  Acute Rehab PT Goals PT Goal Formulation: With patient/family Time For Goal  Achievement: 12/02/22 Potential to Achieve Goals: Fair    Frequency Min 2X/week     Co-evaluation               AM-PAC PT "6 Clicks" Mobility  Outcome Measure Help needed turning from your back to your side while in a flat bed without using bedrails?: A Lot Help needed moving from lying on your back to sitting on the side of a flat bed without using bedrails?: A Lot Help needed moving to and from a bed to a chair (including a wheelchair)?: Total Help needed standing up from a chair using your arms (e.g., wheelchair or bedside chair)?: Total Help needed to walk in hospital room?: Total Help needed climbing 3-5 steps with a railing? : Total 6 Click Score: 8    End of Session Equipment Utilized During Treatment: Gait belt Activity Tolerance: Patient limited by fatigue;Patient limited by pain Patient left: in bed;with call bell/phone within reach;with nursing/sitter in room Nurse Communication: Mobility status PT Visit Diagnosis: Muscle weakness (generalized) (M62.81);Other abnormalities of gait and mobility (R26.89)    Time: [id number] PT Time Calculation (min) (ACUTE ONLY): 20 min   Charges:   PT Evaluation $PT Eval Moderate Complexity: 1 Mod  Arlyce Dice, DPT Physical Therapist Acute Rehabilitation Services Preferred contact method: Secure Chat Weekend Pager Only: [phone] Office: [place] 11/18/2022, 11:54 AM

## 2022-11-18 NOTE — Progress Notes (Signed)
Carotid artery duplex has been completed. Preliminary results can be found in CV Proc through chart review.   11/18/22 9:24 AM Anna Bernard RVT

## 2022-11-19 DIAGNOSIS — D500 Iron deficiency anemia secondary to blood loss (chronic): Secondary | ICD-10-CM | POA: Diagnosis not present

## 2022-11-19 DIAGNOSIS — G20A1 Parkinson's disease without dyskinesia, without mention of fluctuations: Secondary | ICD-10-CM | POA: Diagnosis not present

## 2022-11-19 DIAGNOSIS — R627 Adult failure to thrive: Secondary | ICD-10-CM | POA: Diagnosis not present

## 2022-11-19 DIAGNOSIS — D649 Anemia, unspecified: Secondary | ICD-10-CM | POA: Diagnosis not present

## 2022-11-19 LAB — CBC
HCT: 25.6 % — ABNORMAL LOW (ref 36.0–46.0)
Hemoglobin: 8.2 g/dL — ABNORMAL LOW (ref 12.0–15.0)
MCH: 24 pg — ABNORMAL LOW (ref 26.0–34.0)
MCHC: 32 g/dL (ref 30.0–36.0)
MCV: 75.1 fL — ABNORMAL LOW (ref 80.0–100.0)
Platelets: 375 10*3/uL (ref 150–400)
RBC: 3.41 MIL/uL — ABNORMAL LOW (ref 3.87–5.11)
RDW: 16.8 % — ABNORMAL HIGH (ref 11.5–15.5)
WBC: 9.5 10*3/uL (ref 4.0–10.5)
nRBC: 0 % (ref 0.0–0.2)

## 2022-11-19 LAB — GLUCOSE, CAPILLARY
Glucose-Capillary: 65 mg/dL — ABNORMAL LOW (ref 70–99)
Glucose-Capillary: 123 mg/dL — ABNORMAL HIGH (ref 70–99)
Glucose-Capillary: 134 mg/dL — ABNORMAL HIGH (ref 70–99)
Glucose-Capillary: 112 mg/dL — ABNORMAL HIGH (ref 70–99)
Glucose-Capillary: 100 mg/dL — ABNORMAL HIGH (ref 70–99)

## 2022-11-19 LAB — BASIC METABOLIC PANEL
Anion gap: 8 (ref 5–15)
BUN: 13 mg/dL (ref 8–23)
CO2: 25 mmol/L (ref 22–32)
Calcium: 8.5 mg/dL — ABNORMAL LOW (ref 8.9–10.3)
Chloride: 106 mmol/L (ref 98–111)
Creatinine, Ser: 0.67 mg/dL (ref 0.44–1.00)
GFR, Estimated: >60 mL/min (ref >60)
Glucose, Bld: 77 mg/dL (ref 70–99)
Potassium: 3.6 mmol/L (ref 3.5–5.1)
Sodium: 139 mmol/L (ref 135–145)

## 2022-11-19 LAB — PHOSPHORUS: Phosphorus: 4 mg/dL (ref 2.5–4.6)

## 2022-11-19 LAB — MAGNESIUM: Magnesium: 2.2 mg/dL (ref 1.7–2.4)

## 2022-11-19 MED ORDER — PANTOPRAZOLE SODIUM 40 MG PO TBEC
40.0000 mg | DELAYED_RELEASE_TABLET | Freq: Two times a day (BID) | ORAL | Status: DC
  Administered 2022-11-19 – 2022-11-20 (×2): 40 mg via ORAL
  Filled 2022-11-19 (×2): qty 1

## 2022-11-19 MED ORDER — POLYETHYLENE GLYCOL 3350 17 G PO PACK
17.0000 g | PACK | Freq: Two times a day (BID) | ORAL | Status: DC
  Administered 2022-11-19 – 2022-11-20 (×2): 17 g via ORAL
  Filled 2022-11-19 (×2): qty 1

## 2022-11-19 MED ORDER — ENSURE ENLIVE PO LIQD
237.0000 mL | Freq: Two times a day (BID) | ORAL | Status: DC
  Administered 2022-11-19 – 2022-11-20 (×2): 237 mL via ORAL

## 2022-11-19 NOTE — TOC Initial Note (Signed)
Transition of Care [facility]) - Initial/Assessment Note    Patient Details  Name: Anna Bernard MRN: [id number] Date of Birth: 1950/10/02  Transition of Care [facility]) CM/SW Contact:    Rodney Booze, LCSW Phone Number: 11/19/2022, 12:12 PM  Clinical Narrative:                 CSW spoke to the patient's daughter, the daughter does not want her mom to go to a SNF. Daughter wants the mom to have home health. Georgina Snell from [place] will staff the family however it will be a delay in care. CSW will let the family know as the patient is not up for DC at this time. CSW also gave them always best care and [facility].   Expected Discharge Plan: [place] Barriers to Discharge: No Barriers Identified   Patient Goals and CMS Choice Patient states their goals for this hospitalization and ongoing recovery are:: Wants to go home with [facility] CMS Medicare.gov Compare Post Acute Care list provided to:: Other (Comment Required) (Daughter) Choice offered to / list presented to : Adult [place] ownership interest in [facility].provided to:: Adult Children    Expected Discharge Plan and Services In-house Referral: Clinical Social Work   Post Acute Care Choice: Home Health Living arrangements for the past 2 months: [place]                                      Prior Living Arrangements/Services Living arrangements for the past 2 months: Single Family Home Lives with:: Adult Children Patient language and need for interpreter reviewed:: No Do you feel safe going back to the place where you live?: Yes      Need for Family Participation in Patient Care: No (Comment) Care giver support system in place?: Yes (comment)   Criminal Activity/Legal Involvement Pertinent to Current Situation/Hospitalization: No - Comment as needed  Activities of Daily Living Home Assistive Devices/Equipment: Walker (specify type) ADL Screening (condition at time of  admission) Patient's cognitive ability adequate to safely complete daily activities?: Yes Is the patient deaf or have difficulty hearing?: No Does the patient have difficulty seeing, even when wearing glasses/contacts?: No Does the patient have difficulty concentrating, remembering, or making decisions?: No Patient able to express need for assistance with ADLs?: Yes Does the patient have difficulty dressing or bathing?: No (mostly independent, but sometimes husband help her) Independently performs ADLs?: Yes (appropriate for developmental age) Does the patient have difficulty walking or climbing stairs?: Yes Weakness of Legs: Both Weakness of Arms/Hands: None  Permission Sought/Granted                  Emotional Assessment Appearance:: Appears stated age Attitude/Demeanor/Rapport: Gracious Affect (typically observed): Appropriate, Accepting Orientation: : Oriented to Self, Oriented to Place, Oriented to  Time, Oriented to Situation Alcohol / Substance Use: Not Applicable Psych Involvement: No (comment)  Admission diagnosis:  Syncope [R55] Syncope, unspecified syncope type [R55] Anemia, unspecified type [D64.9] Symptomatic anemia [D64.9] Patient Active Problem List   Diagnosis Date Noted   Symptomatic anemia 11/18/2022   Syncope 11/17/2022   Fatigue 11/14/2022   Urinary frequency 09/18/2022   Hammertoe of second toe of right foot 05/27/2022   Murmur, cardiac 05/25/2022   UTI due to extended-spectrum beta lactamase (ESBL) producing Escherichia coli    Encounter for screening mammogram for breast cancer 05/19/2022   Gastroesophageal  reflux disease 05/19/2022   Nocturia 04/29/2022   History of ESBL E. coli infection 04/29/2022   Need for home health care 04/29/2022   Hypothyroidism 03/03/2022   Pulmonary embolus (HCC)    Non-ST elevation (NSTEMI) myocardial infarction [facility])    Concussion wth loss of consciousness of 30 minutes or less    Paroxysmal atrial fibrillation  (HCC)    Chronic bilateral low back pain without sciatica 01/14/2022   Seborrheic keratoses 07/09/2021   Parkinson disease 04/23/2020   Chronic venous insufficiency 11/26/2019   Vitamin B12 deficiency 02/06/2018   Iron deficiency anemia due to chronic blood loss 01/29/2018   Renal insufficiency 01/29/2018   Pure hypercholesterolemia 12/24/2014   Diabetes mellitus type 2, controlled, without complications ([place]) [id number]   Bruit of right carotid artery 12/24/2014   Thyroid disease 07/06/2014   Severe obesity (BMI >= 40) ([place]) 07/06/2014   Essential hypertension 07/06/2014   PCP:  [name], MD Pharmacy:   [id number]: [phone]: [phone]    Social Determinants of Health (SDOH) Social History: SDOH Screenings   Food Insecurity: No Food Insecurity (11/17/2022)  Housing: Low Risk  (11/17/2022)  Transportation Needs: No Transportation Needs (11/17/2022)  Utilities: Not At Risk (11/17/2022)  Alcohol Screen: Low Risk  (01/23/2020)  Depression (PHQ2-9): Low Risk  (11/14/2022)  Tobacco Use: Medium Risk (11/18/2022)   SDOH Interventions:     Readmission Risk Interventions     No data to display

## 2022-11-19 NOTE — Progress Notes (Signed)
PROGRESS NOTE    TRACI PEERS  [id number] DOB: [birth year] DOA: 11/17/2022 PCP: [name], MD     Brief Narrative:  H/o hypothyroidism, HTN, HLD, diet controlled DM2, b12 deficiency, Parkinson's disease, chronic low back pain, and multiple falls, paroxysmal atrial fibrillation , Pulmonary Emboli on Eliquis, hx of recurrent UTI ESBL who most recently has had multiple near syncopal episodes at home and fall.  Due to these episodes patient followed up with her cardiologist and was place on event monitor with plans for repeat echo. Patient however today hand another episode of syncope and presented to ED.  Patient noted no injuries. She states prior to episode she feels her heart racing and becomes short of breath. She notes no associated chest pain. Patient of note was wearing her event monitor when episode occurred , however per EDP - review with cardiologist noted no arrhythmia.  Patient currently , noted fatigue , but notes no chest pain , or sob currently no fever/chills/  EKG: sinus rhythm  Labs: wbc 6.6 , hgb 7.5 down from 10.3 ( 2023),mcv 72.5, Iron 25, tibc 469, irons sat 5 , ferritin 6 Cr 0.74,  UA: + many bacteria wbc 0-5 Cxr:nad MRI /CT head: NAD  Subjective:  Cbg 65. Gave her orange juice and she asked for ensure    Feeling weak, no fever, reports dysuria, dark urine,  C/o left hip pain Husband at bedside   Assessment & Plan:  Principal Problem:   Syncope Active Problems:   Paroxysmal atrial fibrillation (HCC)   Parkinson disease   Iron deficiency anemia due to chronic blood loss   Pulmonary embolus (HCC)   Symptomatic anemia    Assessment and Plan:  Recurrent syncope/near syncope episodes -CTH/MRI negative  - echo/carotids pending to be complete  --per patient has sob and palpitations prior to episode -check orthostatic  -currently possible due to orthostasis related to volume depletion in setting of symptomatic anemia/ as well as hx of  parkinson's/dysautonomia She is followed by cardiology and currently having event monitor on   Symptomatic, macrocytic anemia, severe iron deficiency,  S/p EGD  on 3/1 by Dr Benson Norway: small bowel enteroscopy showed ".Patchy mildly erythematous mucosa with stigmata of recent bleeding was found in the gastric fundus, Treated with a monopolar probe. Appreciate GI Dr Benson Norway input, Dr Benson Norway recommend resume eliquis from 3/2  History of A-fib and PE (non-occlusive PE right sided DX 5/23 )  held on admission, resume on 3/2 per gi recommendation  Left hip pain Reports  syncopal episode in shower on 2/29, Assisted to ground by family. Denies hitting head.  Left hip x  ray no acute findings,  Pain control,     Recurrent UTI/ESBL colonization  -UA positive nitrite, many bacteria -Reports dysuria, urinary frequency , urine appear cloudy -Urine culture pending -Case discussed with ID Dr. Graylon Good who recommended fosfomycin x 1 dose  Hypertension Blood pressure started to trend up, resume metoprolol    Hyperlipidemia  -continue statin     Hypothyroidism  -resume synthroid  - tsh 0.6   Diet Controlled DMII ? A1c only 5.2 though A1c not reliable due to anemia Am fasting blood glucose is 77 , had hypoglycemic event down to 65 this am, got orange juice -poc glucose tid ac Blood sugar got better after intentional weight loss, used to weigh 240 , now 156,   Parkinson's disease, -resume home regimen sinemet  FTT, will get PT, likely will need snf  Nutritional Assessment: The patient's BMI  is: Body mass index is 29.37 kg/m.Marland Kitchen Seen by dietician.  I agree with the assessment and plan as outlined below: Nutrition Status:        .  I have Reviewed nursing notes, Vitals, pain scores, I/o's, Lab results and  imaging results since pt's last encounter, details please see discussion above  I ordered the following labs:  Unresulted Labs (From admission, onward)     Start     Ordered   11/18/22 0113   Urine Culture  Once,   R        11/18/22 0113   11/18/22 0024  Culture, OB Urine  Once,   R        11/18/22 0023             DVT prophylaxis: SCDs Start: 11/17/22 1647 apixaban (ELIQUIS) tablet 5 mg   Code Status:   Code Status: Full Code  Family Communication: daughter at bedside  Disposition:   Dispo: The patient is from: home              Anticipated d/c is to: [facility] vs SNF              Anticipated d/c date is: likely on 3/3   Antimicrobials:   Anti-infectives (From admission, onward)    Start     Dose/Rate Route Frequency Ordered Stop   11/18/22 1515  fosfomycin (MONUROL) packet 3 g        3 g Oral  Once 11/18/22 1421 11/18/22 1623           Objective: Vitals:   11/18/22 1318 11/18/22 2130 11/19/22 0354 11/19/22 0447  BP: (!) 162/75 138/66 (!) 141/73 (!) 141/73  Pulse: 81 71 73 73  Resp: (!) '22 18 18 18  '$ Temp: 98.2 F (36.8 C) 98.5 F (36.9 C) 98.9 F (37.2 C) 98.1 F (36.7 C)  TempSrc: Oral Oral Oral Oral  SpO2: 98% 96% 96%   Weight:    70.5 kg  Height:    '5\' 1"'$  (1.549 m)    Intake/Output Summary (Last 24 hours) at 11/19/2022 0813 Last data filed at 11/19/2022 0300 Gross per 24 hour  Intake 451.6 ml  Output 2150 ml  Net -1698.4 ml   Filed Weights   11/19/22 0447  Weight: 70.5 kg    Examination:  General exam: weak, but oriented  Respiratory system: Clear to auscultation. Respiratory effort normal. Cardiovascular system:  RRR.  Gastrointestinal system: Abdomen is nondistended, soft and nontender.  Normal bowel sounds heard. Central nervous system: Alert and oriented. No focal neurological deficits. Extremities:  no edema, c/o left hip pain, toes in right foot are cured downward, not able to walk on right foot, can put weight on right heal only , not able to walk on hard floor Skin: No rashes, lesions or ulcers Psychiatry: Judgement and insight appear normal. Mood & affect appropriate.     Data Reviewed: I have personally reviewed  labs and  visualized  imaging studies since the last encounter and formulate the plan        Scheduled Meds:  sodium chloride   Intravenous Once   apixaban  5 mg Oral BID   Carbidopa-Levodopa ER  3 tablet Oral Q0600   And   Carbidopa-Levodopa ER  2 tablet Oral 2 times per day   ferrous sulfate  325 mg Oral Q breakfast   levothyroxine  100 mcg Oral Q0600   lidocaine  1 patch Transdermal Q24H   metoprolol tartrate  12.5  mg Oral BID   multivitamin with minerals  1 tablet Oral Q breakfast   pantoprazole (PROTONIX) IV  40 mg Intravenous Q12H   senna-docusate  1 tablet Oral BID   sodium chloride flush  3 mL Intravenous Q12H   Continuous Infusions:  ferric gluconate (FERRLECIT) IVPB 250 mg (11/18/22 1624)     LOS: 1 day   Time spent:  48mns  FFlorencia Reasons MD PhD FACP [facility]  Available via Epic secure chat 7am-7pm for nonurgent issues Please page for urgent issues To page the attending provider between 7A-7P or the covering provider during after hours 7P-7A, please log into the web site [email or web address] and access using universal [place] password for that web site. If you do not have the password, please call the hospital operator.    11/19/2022, 8:13 AM

## 2022-11-19 NOTE — Discharge Instructions (Signed)
[facility] REQUEST A CONSULTATION [phone] [facility]  [facility]   Address: [address] [place], [place], [place] [address] Hours:  Open ? Closes 6?PM Phone: [phone]    [facility]   [phone]

## 2022-11-19 NOTE — Progress Notes (Signed)
Subjective: Patient had an enteroscopy for IDA. She seems to be doing well today; she is currently receiving an iron infusion. She denies having abdominal pain, nausea or c  vomiting. She has not had a BM in 5 days. She suffers from chronic constipation and takes Colace 100 mg per day. Hemoglobin stable at 8.2 gms/dl today  Objective: Vital signs in last 24 hours: Temp:  [97.5 F (36.4 C)-98.9 F (37.2 C)] 98.1 F (36.7 C) (03/02 0447) Pulse Rate:  [71-85] 73 (03/02 0447) Resp:  [16-22] 18 (03/02 0447) BP: (95-162)/(51-79) 141/73 (03/02 0447) SpO2:  [96 %-99 %] 96 % (03/02 0354) Weight:  [70.5 kg] 70.5 kg (03/02 0447) Last BM Date : 11/15/22  Intake/Output from previous day: 03/01 0701 - 03/02 0700 In: 451.6 [P.O.:120; I.V.:200; IV Piggyback:131.6] Out: 2150 [Urine:2150] Intake/Output this shift: No intake/output data recorded.  General appearance: alert, cooperative, appears stated age, fatigued, and no distress Resp: clear to auscultation bilaterally Cardio: regular rate and rhythm, S1, S2 normal, no murmur, click, rub or gallop GI: soft, non-tender; bowel sounds normal; no masses,  no organomegaly  Lab Results: Recent Labs    11/17/22 1332 11/18/22 0330 11/18/22 0702 11/19/22 0410  WBC 6.6  --   --  9.5  HGB 7.5* 8.4* 8.3* 8.2*  HCT 23.7* 25.7* 25.3* 25.6*  PLT 437*  --   --  375   BMET Recent Labs    11/17/22 1332 11/19/22 0410  NA 139 139  K 4.2 3.6  CL 108 106  CO2 25 25  GLUCOSE 101* 77  BUN 13 13  CREATININE 0.74 0.67  CALCIUM 8.4* 8.5*   Studies/Results: ECHOCARDIOGRAM COMPLETE  Result Date: 11/18/2022    ECHOCARDIOGRAM REPORT   Patient Name:   MAIS MARBAN [place] Date of Exam: 11/18/2022 Medical Rec #:  [id number]      Height:       61.0 in Accession #:    [id number]     Weight:       156.0 lb Date of Birth:  [birth year]       BSA:          1.699 m Patient Age:    26 years       BP:           162/75 mmHg Patient Gender: F              HR:           90 bpm.  Exam Location:  Inpatient Procedure: 2D Echo, Color Doppler and Cardiac Doppler Indications:    Syncope R55  History:        Patient has prior history of Echocardiogram examinations, most                 recent 01/17/2022. CAD, Carotid Disease, Arrythmias:Atrial                 Fibrillation; Risk Factors:Former Smoker, Hypertension and                 Dyslipidemia.  Sonographer:    Greer Pickerel Referring Phys: [id number] SARA-MAIZ A THOMAS  Sonographer Comments: Suboptimal subcostal window. Image acquisition challenging due to patient body habitus and Image acquisition challenging due to respiratory motion. IMPRESSIONS  1. There is an intracavitary gradient likely related to hyperdynamic function. Left ventricular ejection fraction, by estimation, is 70 to 75%. The left ventricle has hyperdynamic function. Left ventricular endocardial border not optimally defined to evaluate regional wall motion. Indeterminate diastolic filling  due to E-A fusion.  2. Right ventricular systolic function is hyperdynamic. The right ventricular size is normal.  3. Left atrial size was moderately dilated.  4. There is an echodensity in the right atrium seen on subcostal views.  5. The mitral valve is abnormal. No evidence of mitral valve regurgitation. Moderate mitral annular calcification.  6. Aortic valve gradients may be due to hyperdynamic function. The aortic valve was not well visualized. Aortic valve regurgitation is not visualized. Comparison(s): Prior images reviewed side by side. LVEF has increased. In correlated to prior cardiac imaging, echodensity may be most consistent with a prominent crista terminalis. FINDINGS  Left Ventricle: There is an intracavitary gradient likely related to hyperdynamic function. Left ventricular ejection fraction, by estimation, is 70 to 75%. The left ventricle has hyperdynamic function. Left ventricular endocardial border not optimally defined to evaluate regional wall motion. The left ventricular  internal cavity size was small. There is no left ventricular hypertrophy. Indeterminate diastolic filling due to E-A fusion. Right Ventricle: The right ventricular size is normal. No increase in right ventricular wall thickness. Right ventricular systolic function is hyperdynamic. Left Atrium: Left atrial size was moderately dilated. Right Atrium: There is an echodensity in the right atrium seen on subcostal views. Right atrial size was normal in size. Pericardium: Trivial pericardial effusion is present. Mitral Valve: The mitral valve is abnormal. Moderate mitral annular calcification. No evidence of mitral valve regurgitation. Tricuspid Valve: The tricuspid valve is normal in structure. Tricuspid valve regurgitation is not demonstrated. Aortic Valve: Aortic valve gradients may be due to hyperdynamic function. The aortic valve was not well visualized. Aortic valve regurgitation is not visualized. Aortic valve mean gradient measures 14.0 mmHg. Aortic valve peak gradient measures 23.2 mmHg. Aortic valve area, by VTI measures 1.56 cm. Pulmonic Valve: The pulmonic valve was not well visualized. Pulmonic valve regurgitation is not visualized. Aorta: The aortic root is normal in size and structure. IAS/Shunts: No atrial level shunt detected by color flow Doppler.  LEFT VENTRICLE PLAX 2D LVIDd:         2.20 cm   Diastology LVIDs:         1.40 cm   LV e' medial:    6.53 cm/s LV PW:         0.80 cm   LV E/e' medial:  8.5 LV IVS:        0.80 cm   LV e' lateral:   8.27 cm/s LVOT diam:     1.80 cm   LV E/e' lateral: 6.7 LV SV:         71 LV SV Index:   42 LVOT Area:     2.54 cm  RIGHT VENTRICLE RV S prime:     16.00 cm/s TAPSE (M-mode): 2.1 cm LEFT ATRIUM             Index        RIGHT ATRIUM           Index LA Vol (A2C):   68.3 ml 40.19 ml/m  RA Area:     11.10 cm LA Vol (A4C):   70.4 ml 41.42 ml/m  RA Volume:   18.80 ml  11.06 ml/m LA Biplane Vol: 71.0 ml 41.78 ml/m  AORTIC VALVE AV Area (Vmax):    1.81 cm AV Area  (Vmean):   1.59 cm AV Area (VTI):     1.56 cm AV Vmax:           241.00 cm/s AV Vmean:  176.000 cm/s AV VTI:            0.454 m AV Peak Grad:      23.2 mmHg AV Mean Grad:      14.0 mmHg LVOT Vmax:         171.00 cm/s LVOT Vmean:        110.000 cm/s LVOT VTI:          0.279 m LVOT/AV VTI ratio: 0.61  AORTA Ao Root diam: 3.10 cm MITRAL VALVE                TRICUSPID VALVE MV Area (PHT): 4.21 cm     TR Peak grad:   8.6 mmHg MV Decel Time: 180 msec     TR Vmax:        147.00 cm/s MR Peak grad: 52.9 mmHg MR Vmax:      363.50 cm/s   SHUNTS MV E velocity: 55.30 cm/s   Systemic VTI:  0.28 m MV A velocity: 105.00 cm/s  Systemic Diam: 1.80 cm MV E/A ratio:  0.53 Rudean Haskell MD Electronically signed by Rudean Haskell MD Signature Date/Time: 11/18/2022/4:58:03 PM    Final    VAS US CAROTID  Result Date: 11/18/2022 Carotid Arterial Duplex Study Patient Name:  LAVERLE FARVER [facility]  Date of Exam:   11/18/2022 Medical Rec #: [id number]       Accession #:    [id number] Date of Birth: 1950/12/09        Patient Gender: F Patient Age:   51 years [facility]am [facility] Procedure:      VAS US CAROTID Referring Phys: Myles Rosenthal --------------------------------------------------------------------------------  Indications:       Syncope. Risk Factors:      Hypertension, hyperlipidemia, Diabetes. Comparison Study:  02/28/2022 - Right Carotid: Velocities in the right ICA are                    consistent with a 1-39% stenosis.                     Left Carotid: Velocities in the left ICA are consistent with                    a 1-39%                    stenosis.                     Vertebrals: Bilateral vertebral arteries demonstrate                    antegrade flow.                    Subclavians: Right subclavian artery flow was disturbed.                    Normal flow hemodynamics were seen in the left subclavian                    artery. Performing Technologist: Oliver Hum RVT  Examination  Guidelines: A complete evaluation includes B-mode imaging, spectral Doppler, color Doppler, and power Doppler as needed of all accessible portions of each vessel. Bilateral testing is considered an integral part of a complete examination. Limited examinations for reoccurring indications may be performed as noted.  Right Carotid Findings: +----------+--------+--------+--------+-----------------------+--------+           PSV cm/sEDV cm/sStenosisPlaque  Description     Comments +----------+--------+--------+--------+-----------------------+--------+ CCA Prox  91      16                                              +----------+--------+--------+--------+-----------------------+--------+ CCA Distal99      22              smooth and heterogenous         +----------+--------+--------+--------+-----------------------+--------+ ICA Prox  108     23              smooth and heterogenous         +----------+--------+--------+--------+-----------------------+--------+ ICA Distal61      25                                     tortuous +----------+--------+--------+--------+-----------------------+--------+ ECA       145     16                                              +----------+--------+--------+--------+-----------------------+--------+ +----------+--------+-------+--------+-------------------+           PSV cm/sEDV cmsDescribeArm Pressure (mmHG) +----------+--------+-------+--------+-------------------+ Subclavian69                                         +----------+--------+-------+--------+-------------------+ +---------+--------+--+--------+--+---------+ VertebralPSV cm/s48EDV cm/s11Antegrade +---------+--------+--+--------+--+---------+  Left Carotid Findings: +----------+--------+--------+--------+-----------------------+--------+           PSV cm/sEDV cm/sStenosisPlaque Description     Comments  +----------+--------+--------+--------+-----------------------+--------+ CCA Prox  135     21              smooth and heterogenous         +----------+--------+--------+--------+-----------------------+--------+ CCA Distal116     27              smooth and heterogenous         +----------+--------+--------+--------+-----------------------+--------+ ICA Prox  98      23              smooth and heterogenous         +----------+--------+--------+--------+-----------------------+--------+ ICA Distal74      25                                     tortuous +----------+--------+--------+--------+-----------------------+--------+ ECA       148     25                                              +----------+--------+--------+--------+-----------------------+--------+ +----------+--------+--------+--------+-------------------+           PSV cm/sEDV cm/sDescribeArm Pressure (mmHG) +----------+--------+--------+--------+-------------------+ Subclavian200                                         +----------+--------+--------+--------+-------------------+ +---------+--------+--+--------+--+---------+ VertebralPSV cm/s60EDV cm/s16Antegrade +---------+--------+--+--------+--+---------+   Summary: Right Carotid:  Velocities in the right ICA are consistent with a 1-39% stenosis. Left Carotid: Velocities in the left ICA are consistent with a 1-39% stenosis. Vertebrals: Bilateral vertebral arteries demonstrate antegrade flow. *See table(s) above for measurements and observations.  Electronically signed by Monica Martinez MD on 11/18/2022 at 12:31:35 PM.    Final    CT HEAD WO CONTRAST (5MM)  Result Date: 11/17/2022 CLINICAL DATA:  Initial evaluation for syncope/presyncope EXAM: CT HEAD WITHOUT CONTRAST TECHNIQUE: Contiguous axial images were obtained from the base of the skull through the vertex without intravenous contrast. RADIATION DOSE REDUCTION: This exam was performed  according to the departmental dose-optimization program which includes automated exposure control, adjustment of the mA and/or kV according to patient size and/or use of iterative reconstruction technique. COMPARISON:  Prior MRI from earlier the same day. FINDINGS: Brain: Mild age-related cerebral atrophy with moderate chronic microvascular ischemic disease. No acute intracranial hemorrhage. No acute large vessel territory infarct. No mass lesion or midline shift. No hydrocephalus or extra-axial fluid collection. Vascular: No abnormal hyperdense vessel. Scattered vascular calcifications noted within the carotid siphons. Skull: Scalp soft tissues and calvarium demonstrate no acute finding. Sinuses/Orbits: Globes orbital soft tissues demonstrate no acute finding. Paranasal sinuses and mastoid air cells are clear. Other: None. IMPRESSION: 1. No acute intracranial abnormality. 2. Mild age-related cerebral atrophy with moderate chronic microvascular ischemic disease. Electronically Signed   By: Jeannine Boga M.D.   On: 11/17/2022 20:00   MR BRAIN WO CONTRAST  Result Date: 11/17/2022 CLINICAL DATA:  Initial evaluation for syncope/presyncope, stroke suspected. EXAM: MRI HEAD WITHOUT CONTRAST TECHNIQUE: Multiplanar, multiecho pulse sequences of the brain and surrounding structures were obtained without intravenous contrast. COMPARISON:  Prior study from 01/16/2022. FINDINGS: Brain: Mild age-related cerebral atrophy. Patchy and confluent T2/FLAIR hyperintensity involving the periventricular deep white matter both cerebral hemispheres, consistent with chronic small vessel ischemic disease, moderate in nature. No evidence for acute or subacute ischemia. Gray-white matter differentiation maintained. No areas of chronic cortical infarction. No acute or chronic intracranial blood products. No mass lesion, midline shift or mass effect. No hydrocephalus or extra-axial fluid collection. Pituitary gland and suprasellar  region within normal limits. Vascular: Major intracranial vascular flow voids are maintained. Skull and upper cervical spine: Craniocervical junction normal. Bone marrow signal intensity within normal limits. No scalp soft tissue abnormality. Sinuses/Orbits: Prior ocular lens replacement on the right. Right gaze preference noted. Paranasal sinuses are clear. No mastoid effusion. Other: None. IMPRESSION: 1. No acute intracranial abnormality. 2. Mild age-related cerebral atrophy with moderate chronic microvascular ischemic disease. Electronically Signed   By: Jeannine Boga M.D.   On: 11/17/2022 19:58   DG CHEST PORT 1 VIEW  Result Date: 11/17/2022 CLINICAL DATA:  Syncope EXAM: PORTABLE CHEST 1 VIEW COMPARISON:  01/16/2022 FINDINGS: Cardiac size is within normal limits. Lung fields are clear of any infiltrates or pulmonary edema. There is no pleural effusion or pneumothorax. There is an electronic device superimposed over the superior mediastinum. Evaluation of this finding is limited in this single AP portable view. Degenerative changes are noted in both shoulders and both AC joints. Left hemidiaphragm is elevated with no interval change. IMPRESSION: There are no new infiltrates or signs of pulmonary edema. Electronically Signed   By: Elmer Picker M.D.   On: 11/17/2022 17:04   DG Hip Unilat W or Wo Pelvis 2-3 Views Left  Result Date: 11/17/2022 CLINICAL DATA:  Syncope.  Pain. EXAM: DG HIP (WITH OR WITHOUT PELVIS) 2-3V LEFT COMPARISON:  01/04/2022 FINDINGS: There is  no evidence of hip fracture or dislocation. There is no evidence of arthropathy or other focal bone abnormality. IMPRESSION: Negative. Electronically Signed   By: Rolm Baptise M.D.   On: 11/17/2022 14:45    Medications: I have reviewed the patient's current medications. Prior to Admission:  Medications Prior to Admission  Medication Sig Dispense Refill Last Dose   Carbidopa-Levodopa ER (SINEMET CR) 25-100 MG tablet controlled  release TAKE 3 TABS AT 7 AM, 2 TABS AT 11AM, 2 TABS AT 4PM (Patient taking differently: 2-3 tablets See admin instructions. Take 3 tablets by mouth at 7 AM, 2 tablets at 11 AM, and 2 tablets at 4 PM) 630 tablet 1 11/17/2022   diclofenac Sodium (VOLTAREN) 1 % GEL APPLY 4 G TOPICALLY 4 TIMES A DAY (Patient taking differently: Apply 2 g topically 4 (four) times daily as needed (for pain- affected sites).) 300 g 1 unk   famotidine (PEPCID) 20 MG tablet TAKE 1 TABLET BY MOUTH EVERY DAY (Patient taking differently: Take 20 mg by mouth daily as needed for heartburn or indigestion.) 90 tablet 3 unk   ferrous sulfate 325 (65 FE) MG tablet TAKE 1 TABLET BY MOUTH EVERY DAY WITH BREAKFAST (Patient taking differently: Take 325 mg by mouth daily with breakfast.) 90 tablet 1 11/16/2022   levothyroxine (SYNTHROID) 100 MCG tablet TAKE 1 TABLET BY MOUTH EVERY MORNING 30 MINUTES BEFORE FOOD (Patient taking differently: Take 100 mcg by mouth daily before breakfast.) 90 tablet 1 11/17/2022 at am   lidocaine (LIDODERM) 5 % Place 1 patch onto the skin daily. Remove & Discard patch within 12 hours or as directed by MD (Patient taking differently: Place 1 patch onto the skin daily as needed (for pain- Remove & Discard patch within 12 hours or as directed by MD).) 5 patch 0 unk   melatonin 3 MG TABS tablet Take 3 mg by mouth at bedtime as needed (For sleep).   unk   metoprolol tartrate (LOPRESSOR) 25 MG tablet TAKE 1/2 TABLET TWICE A DAY BY MOUTH (Patient taking differently: Take 12.5 mg by mouth 2 (two) times daily.) 30 tablet 0 11/17/2022 at 0600   Multiple Vitamin (MULTIVITAMIN WITH MINERALS) TABS Take 1 tablet by mouth daily with breakfast.   11/16/2022   TYLENOL 8 HOUR ARTHRITIS PAIN 650 MG CR tablet Take 1,300 mg by mouth every 8 (eight) hours as needed for pain.   11/16/2022   acetaminophen (TYLENOL) 325 MG tablet Take 2 tablets (650 mg total) by mouth every 6 (six) hours as needed for mild pain or moderate pain. (Patient not  taking: Reported on 11/17/2022)   Not Taking   apixaban (ELIQUIS) 5 MG TABS tablet Take 1 tablet (5 mg total) by mouth 2 (two) times daily. Start after treatment course of eliquis completed (10 mg BID eliquis course for 6 additional days) (Patient not taking: Reported on 11/17/2022) 60 tablet 6 Not Taking   ferrous sulfate 324 (65 Fe) MG TBEC 1 daily (Patient not taking: Reported on 11/17/2022) 100 tablet 1 Not Taking   rosuvastatin (CRESTOR) 10 MG tablet Take 1 tablet (10 mg total) by mouth daily. (Patient not taking: Reported on 11/17/2022) 90 tablet 1 Not Taking   Scheduled:  sodium chloride   Intravenous Once   apixaban  5 mg Oral BID   Carbidopa-Levodopa ER  3 tablet Oral Q0600   And   Carbidopa-Levodopa ER  2 tablet Oral 2 times per day   feeding supplement  237 mL Oral BID BM   ferrous  sulfate  325 mg Oral Q breakfast   levothyroxine  100 mcg Oral Q0600   lidocaine  1 patch Transdermal Q24H   metoprolol tartrate  12.5 mg Oral BID   multivitamin with minerals  1 tablet Oral Q breakfast   pantoprazole  40 mg Oral BID   senna-docusate  1 tablet Oral BID   sodium chloride flush  3 mL Intravenous Q12H   Continuous: [id number] **OR** acetaminophen, HYDROcodone-acetaminophen, ondansetron **OR** ondansetron (ZOFRAN) IV  Assessment/Plan: 1) Iron deficiency anemia -on Ferrous sulfate Patient is to be discharged tomorrow. 2) Chronic constipation-Miralax ND cOLACE 2 BID PO advised. 3) PAF on Eliquis. 4) Parkinson's disease.  5) PE   LOS: 1 day   Juanita Craver 11/19/2022, 7:39 AM

## 2022-11-19 NOTE — Progress Notes (Signed)
PHARMACIST - PHYSICIAN COMMUNICATION  DR:   Erlinda Hong  CONCERNING: IV to Oral Route Change Policy  RECOMMENDATION: This patient is receiving pantoprazole by the intravenous route.  Based on criteria approved by the Pharmacy and Therapeutics Committee, the intravenous medication(s) is/are being converted to the equivalent oral dose form(s).   DESCRIPTION: These criteria include: The patient is eating (either orally or via tube) and/or has been taking other orally administered medications for a least 24 hours The patient has no evidence of active gastrointestinal bleeding or impaired GI absorption (gastrectomy, short bowel, patient on TNA or NPO).  If you have questions about this conversion, please contact the Pharmacy Department  '[]'$   [phone] )  [facility] '[]'$   [phone] )  [facility] '[]'$   [phone] )  [facility] '[]'$   [phone] )  [facility] '[x]'$   [phone] )  [place], [facility] 11/19/2022 1:47 PM

## 2022-11-19 NOTE — Evaluation (Signed)
Occupational Therapy Evaluation Patient Details Name: Anna Bernard MRN: [id number] DOB: 1951-05-28 Today's Date: 11/19/2022   History of Present Illness Patient is a 72 year old female who most recently has had multiple near syncopal episodes and home and fall.  Due to these episodes patient followed up with her cardiologist and was place on event monitor with plans for repeat echo. Patient however had another episode of syncope and presented to ED. 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 Patient was living at home with family support with patient reporting being independent in ADLs and daughter reporting helping patient (supervision)with all standing tasks and (physical A) LB dress/bathing at home. Patient was noted to have decreased standing balance, decreased functional activity tolerance, increased pain, decreased time out of bed effecting participation in ADLs. Patients daughter reported that they can support patient physically 24/7 at home at this time.  Patient would continue to benefit from skilled OT services at this time while admitted and after d/c to address noted deficits in order to improve overall safety and independence in ADLs.       Recommendations for follow up therapy are one component of a multi-disciplinary discharge planning process, led by the attending physician.  Recommendations may be updated based on patient status, additional functional criteria and insurance authorization.   Follow Up Recommendations  Skilled nursing-short term rehab (<3 hours/day)     Assistance Recommended at Discharge Frequent or  constant Supervision/Assistance  Patient can return home with the following Two people to help with walking and/or transfers;A lot of help with bathing/dressing/bathroom;Assistance with cooking/housework;Direct supervision/assist for medications management;Assist for transportation;Direct supervision/assist for financial management;Help with stairs or ramp for entrance    Functional Status Assessment  Patient has had a recent decline in their functional status and demonstrates the ability to make significant improvements in function in a reasonable and predictable amount of time.  Equipment Recommendations  Wheelchair cushion (measurements OT);Wheelchair (measurements OT);BSC/3in1       Precautions / Restrictions Precautions Precautions: Fall Restrictions Weight Bearing Restrictions: No      Mobility Bed Mobility Overal bed mobility: Needs Assistance Bed Mobility: Supine to Sit, Sit to Supine     Supine to sit: Max assist Sit to supine: Max assist   General bed mobility comments: with stiff movements and increased time and cuse for log rolling to avoid back pressure         Balance Overall balance assessment: Needs assistance Sitting-balance support: Feet supported, Single extremity supported Sitting balance-Leahy Scale: Fair     Standing balance support: Reliant on assistive device for balance, Bilateral upper extremity supported Standing balance-Leahy Scale: Zero       ADL either performed or assessed with clinical judgement   ADL Overall ADL's : Needs assistance/impaired Eating/Feeding: Set up;Sitting Eating/Feeding Details (indicate cue type and reason): with education provided to family on importance of sitting as upright as possible. Grooming: Minimal assistance;Bed level   Upper Body Bathing: Bed level;Moderate assistance   Lower Body Bathing: Bed level;Maximal assistance   Upper Body Dressing : Moderate assistance;Bed level   Lower Body Dressing: Maximal  assistance;Bed level Lower Body Dressing Details (indicate cue type and reason): patient is unable to complete figure  four. patient's daughter then corrected after first attempt to say they completed LB bath/dress for patient. Toilet Transfer: Maximal Print production planner Details (indicate cue type and reason): patient was max A for sit to stand x3 with patient unable to advance BLE. 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              Pertinent Vitals/Pain Pain Assessment Pain Assessment: Faces Faces Pain Scale: Hurts little more Pain Location: left hip Pain Descriptors / Indicators: Sore, Guarding, Grimacing Pain Intervention(s): Limited activity within patient's tolerance, Monitored during session     Hand Dominance Right      Communication Communication Communication: No difficulties   Cognition Arousal/Alertness: Awake/alert Behavior During Therapy: Flat affect Overall Cognitive Status: History of cognitive impairments - at baseline     General Comments: patient made minimal verbalizations during session. daughter was present and able to prove majority of PLOF                Home Living Family/patient expects to be discharged to:: Private residence Living Arrangements: Spouse/significant other Available Help at Discharge: Family;Available 24 hours/day Type of Home: House Home Access: Ramped entrance     Home Layout: Two level;Full bath on main level     Bathroom Shower/Tub: Occupational psychologist: Standard Bathroom Accessibility: No   Home Equipment: Rollator (4 wheels);Rolling Walker (2 wheels);BSC/3in1;Shower seat;Wheelchair - manual          Prior Functioning/Environment Prior Level of Function : Needs assist             Mobility Comments: spouse  reports pt typically requires assist for bed mobility and transfers, limited with ambulation; pt provides inaccurate history ADLs Comments: Husband assist with shower transfers. patient reported being independent in ADLs with daughter reporting she had assist while standing but able to complete things while seated.        OT Problem List: Decreased activity tolerance;Impaired balance (sitting and/or standing);Decreased coordination;Decreased safety awareness;Decreased knowledge of precautions;Pain;Decreased knowledge of use of DME or AE;Decreased cognition      OT Treatment/Interventions: Self-care/ADL training;Energy conservation;Therapeutic exercise;DME and/or AE instruction;Therapeutic activities;Patient/family education;Balance training    OT Goals(Current goals can be found in the care plan section) Acute Rehab OT Goals Patient Stated Goal: to go home OT Goal Formulation: With patient/family Time For Goal Achievement: 12/03/22 Potential to Achieve Goals: Fair  OT Frequency: Min 2X/week       AM-PAC OT "6 Clicks" Daily Activity     Outcome Measure Help from another person eating meals?: A Little Help from another person taking care of personal grooming?: A Little Help from another person toileting, which includes using toliet, bedpan, or urinal?: A Lot Help from another person bathing (including washing, rinsing, drying)?: A Lot Help from another person to put on and taking off regular upper body clothing?: A Lot Help from another person to put on and taking off regular lower body clothing?: A Lot 6 Click Score: 14   End of Session Equipment Utilized During Treatment: Gait belt;Rolling walker (2 wheels) Nurse Communication: Mobility status  Activity Tolerance: Patient tolerated treatment well Patient left: in bed;with call bell/phone within reach;with bed alarm set  OT Visit Diagnosis: Unsteadiness on feet (R26.81);Muscle weakness (generalized) (M62.81);Other abnormalities of  gait and mobility (R26.89);Pain Pain - Right/Left: Left Pain - part of body: Hip  Time: [id number] OT Time Calculation (min): 38 min Charges:  OT General Charges $OT Visit: 1 Visit OT Evaluation $OT Eval Moderate Complexity: 1 Mod OT Treatments $Self Care/Home Management : 23-37 mins  Michale Weikel OTR/L, MS Acute Rehabilitation Department Office# [phone]   Willa Rough 11/19/2022, 3:12 PM

## 2022-11-19 NOTE — Progress Notes (Signed)
Patient's blood sugar in only 65. Gave her orange juice and she asked for ensure. NP informed. Will recheck after.

## 2022-11-20 DIAGNOSIS — G20A1 Parkinson's disease without dyskinesia, without mention of fluctuations: Secondary | ICD-10-CM | POA: Diagnosis not present

## 2022-11-20 DIAGNOSIS — R627 Adult failure to thrive: Secondary | ICD-10-CM | POA: Diagnosis not present

## 2022-11-20 DIAGNOSIS — D649 Anemia, unspecified: Secondary | ICD-10-CM | POA: Diagnosis not present

## 2022-11-20 LAB — BASIC METABOLIC PANEL
Anion gap: 7 (ref 5–15)
BUN: 16 mg/dL (ref 8–23)
CO2: 26 mmol/L (ref 22–32)
Calcium: 8.6 mg/dL — ABNORMAL LOW (ref 8.9–10.3)
Chloride: 108 mmol/L (ref 98–111)
Creatinine, Ser: 0.59 mg/dL (ref 0.44–1.00)
GFR, Estimated: >60 mL/min (ref >60)
Glucose, Bld: 72 mg/dL (ref 70–99)
Potassium: 3.7 mmol/L (ref 3.5–5.1)
Sodium: 141 mmol/L (ref 135–145)

## 2022-11-20 LAB — GLUCOSE, CAPILLARY
Glucose-Capillary: 74 mg/dL (ref 70–99)
Glucose-Capillary: 89 mg/dL (ref 70–99)
Glucose-Capillary: 161 mg/dL — ABNORMAL HIGH (ref 70–99)

## 2022-11-20 MED ORDER — LIDOCAINE 5 % EX PTCH: 1.0000 | MEDICATED_PATCH | CUTANEOUS | 0 refills | Status: DC

## 2022-11-20 MED ORDER — PANTOPRAZOLE SODIUM 40 MG PO TBEC: 40.0000 mg | DELAYED_RELEASE_TABLET | Freq: Two times a day (BID) | ORAL | 0 refills | Status: DC

## 2022-11-20 MED ORDER — SENNOSIDES-DOCUSATE SODIUM 8.6-50 MG PO TABS: 1.0000 | ORAL_TABLET | Freq: Every day | ORAL | 0 refills | Status: DC

## 2022-11-20 MED ORDER — HYDROCODONE-ACETAMINOPHEN 5-325 MG PO TABS: 1.0000 | ORAL_TABLET | Freq: Four times a day (QID) | ORAL | 0 refills | Status: DC | PRN

## 2022-11-20 MED ORDER — POLYETHYLENE GLYCOL 3350 17 G PO PACK: 17.0000 g | PACK | Freq: Two times a day (BID) | ORAL | 0 refills | Status: DC

## 2022-11-20 NOTE — Discharge Summary (Addendum)
Discharge Summary  Anna Bernard [id number] DOB: 04-Aug-1951  PCP: [name], MD  Admit date: 11/17/2022 Discharge date: 11/20/2022    Time spent: 31mns, more than 50% time spent on coordination of care.   Recommendations for Outpatient Follow-up:  F/u with PCP within a week  for hospital discharge follow up, repeat cbc/bmp at follow up Family declined SNF, Home health arranged    Discharge Diagnoses:  Active Hospital Problems   Diagnosis Date Noted   Syncope 11/17/2022   Paroxysmal atrial fibrillation (HCC)     Priority: 3.   Parkinson disease 04/23/2020    Priority: 5.   Symptomatic anemia 11/18/2022   Pulmonary embolus (HCC)    Iron deficiency anemia due to chronic blood loss 01/29/2018    Resolved Hospital Problems  No resolved problems to display.    Discharge Condition: stable  Diet recommendation: regular diet   Filed Weights   11/19/22 0447 11/20/22 0500  Weight: 70.5 kg 71.3 kg    History of present illness: ( per admitting MD Dr TMarcello Moores Chief Complaint: recurrent syncope    HPI: Anna Bernard a 72y.o. female with medical history significant of  hyperlipidemia , thyroid disease,hypertension, diabetes, Parkinson's disease, vitamin B12 deficiency, microcytic anemia, chronic low back pain, and multiple falls, paroxysmal atrial fibrillation , Pulmonary Emboli on Eliquis, hx of recurrent UTI ESBL who most recently has had multiple near syncopal episodes and home and fall.  Due to these episodes patient followed up with her cardiologist and was place on event monitor with plans for repeat echo. Patient however today hand another episode of syncope and presented to ED.  Patient noted no injuries. She states prior to episode she feels her heart racing and becomes short of breath. She notes no associated chest pain. Patient of note was wearing her event monitor when episode occurred , however per EDP - review with cardiologist noted no arrhythmia.   Patient currently , noted fatigue , but notes no chest pain , or sob currently no fever/chills/  ED Course:  In ED  Vitals: afeb, bp165/70, hr 73 , rr 19 sat 99%  EKG: sinus rhythm  Labs: wbc 6.6 , hgb 7.5 down from 10.3 ( 2023),mcv 72.5 Cr 0.74,  Ce 4,4 Tx : ns 500 cc x 1, prbc x 1 UA: + many bacteria wbc 0-5 Cxr:nad Iron 25, tibc 469, irons sat 5 , ferritin 6 MRI /CT head: NAD  Hospital Course:  Principal Problem:   Syncope Active Problems:   Paroxysmal atrial fibrillation (HCC)   Parkinson disease   Iron deficiency anemia due to chronic blood loss   Pulmonary embolus (HCC)   Symptomatic anemia   Assessment and Plan:   Recurrent syncope/near syncope episodes -CTH/MRI negative  - echo lvef 70-75%, carotids right and left ICA consistent with a 1-39% stenosis. Vertebrals: Bilateral vertebral arteries demonstrate antegrade flow.  --possible due to orthostasis related to volume depletion in setting of symptomatic anemia/ as well as hx of parkinson's/dysautonomia -She is followed by cardiology and currently having event monitor on, no arrhythmia identified so far, f/u with pcp and cardiology     Symptomatic, macrocytic anemia, severe iron deficiency,  -S/p EGD  on 3/1 by Dr HBenson Norway small bowel enteroscopy showed ".Patchy mildly erythematous mucosa with stigmata of recent bleeding was found in the gastric fundus, Treated with a monopolar probe. Appreciate GI Dr HBenson Norwayinput, Dr HBenson Norwayrecommend resume eliquis from 3/2 Continue ppi, iron supplement F/u with pcp  pA-fib and PE (non-occlusive PE right sided DX 5/23 ) Has been sinus rhythm in the hospital  eliquis resumed on 3/2 per gi recommendation  continue metoprolol F/u with pcp and cardiology  Left hip pain Reports  syncopal episode in shower on 2/29, Assisted to ground by family. Denies hitting head.  Left hip x  ray no acute findings,  Pain control, physical therapy      Recurrent UTI/ESBL colonization  -UA positive  nitrite, many bacteria -Reports dysuria, urinary frequency , urine appear cloudy -Urine culture was not collected -Case discussed with ID Dr. Graylon Good who recommended fosfomycin x 1 dose F/u with pcp   Hypertension continue metoprolol     Hyperlipidemia  -continue statin     Hypothyroidism  -resume synthroid  - tsh 0.6    Diet Controlled DMII  -Per Family reports her Blood sugar got better after intentional weight loss, used to weigh 240 , now 156,  -A1c only 5.2 though A1c not reliable due to anemia -am fasting blood glucose is 77 , had hypoglycemic event down to 65 on 3/2, got orange juice -poc glucose tid ac ranged from 89 to 161 on day of discharge, oral intake has improved     Parkinson's disease, -resume home regimen sinemet   FTT, Pt recommended SNF, family elected to go home, maximize home health   Discharge Exam: BP (!) 121/59 (BP Location: Right Arm)   Pulse 88   Temp 97.8 F (36.6 C) (Oral)   Resp (!) 24   Ht '5\' 1"'$  (1.549 m)   Wt 71.3 kg   SpO2 99%   BMI 29.69 kg/m   General: NAD, chronic ill appearing , AAOX3 Cardiovascular: RRR Respiratory: normal respiratory effort     Discharge Instructions     Diet general   Complete by: As directed    Increase activity slowly   Complete by: As directed       Allergies as of 11/20/2022   Not on File      Medication List     TAKE these medications    apixaban 5 MG Tabs tablet Commonly known as: ELIQUIS Take 1 tablet (5 mg total) by mouth 2 (two) times daily. Start after treatment course of eliquis completed (10 mg BID eliquis course for 6 additional days)   Carbidopa-Levodopa ER 25-100 MG tablet controlled release Commonly known as: SINEMET CR TAKE 3 TABS AT 7 AM, 2 TABS AT 11AM, 2 TABS AT 4PM What changed:  how much to take when to take this additional instructions   diclofenac Sodium 1 % Gel Commonly known as: VOLTAREN APPLY 4 G TOPICALLY 4 TIMES A DAY What changed: See the new  instructions.   famotidine 20 MG tablet Commonly known as: PEPCID TAKE 1 TABLET BY MOUTH EVERY DAY What changed:  when to take this reasons to take this   ferrous sulfate 325 (65 FE) MG tablet TAKE 1 TABLET BY MOUTH EVERY DAY WITH BREAKFAST What changed:  See the new instructions. Another medication with the same name was removed. Continue taking this medication, and follow the directions you see here.   HYDROcodone-acetaminophen 5-325 MG tablet Commonly known as: NORCO/VICODIN Take 1 tablet by mouth every 6 (six) hours as needed for moderate pain or severe pain.   levothyroxine 100 MCG tablet Commonly known as: SYNTHROID TAKE 1 TABLET BY MOUTH EVERY MORNING 30 MINUTES BEFORE FOOD What changed: See the new instructions.   lidocaine 5 % Commonly known as: LIDODERM Place 1 patch onto  the skin daily. Remove & Discard patch within 12 hours or as directed by MD What changed:  when to take this reasons to take this additional instructions   lidocaine 5 % Commonly known as: LIDODERM Place 1 patch onto the skin daily. Remove & Discard patch within 12 hours or as directed by MD To left hip What changed: You were already taking a medication with the same name, and this prescription was added. Make sure you understand how and when to take each.   melatonin 3 MG Tabs tablet Take 3 mg by mouth at bedtime as needed (For sleep).   metoprolol tartrate 25 MG tablet Commonly known as: LOPRESSOR TAKE 1/2 TABLET TWICE A DAY BY MOUTH What changed: See the new instructions.   multivitamin with minerals Tabs tablet Take 1 tablet by mouth daily with breakfast.   pantoprazole 40 MG tablet Commonly known as: PROTONIX Take 1 tablet (40 mg total) by mouth 2 (two) times daily.   polyethylene glycol 17 g packet Commonly known as: MIRALAX / GLYCOLAX Take 17 g by mouth 2 (two) times daily.   rosuvastatin 10 MG tablet Commonly known as: CRESTOR Take 1 tablet (10 mg total) by mouth daily.    senna-docusate 8.6-50 MG tablet Commonly known as: Senokot-S Take 1 tablet by mouth at bedtime. Hold if diarrhea   Tylenol 8 Hour Arthritis Pain 650 MG CR tablet Generic drug: acetaminophen Take 1,300 mg by mouth every 8 (eight) hours as needed for pain. What changed: Another medication with the same name was removed. Continue taking this medication, and follow the directions you see here.       Not on File  Follow-up Information     Pray, Norwood Levo, MD Follow up in 1 week(s).   Specialty: Family Medicine Why: hospital discharge follow up, repeat cbc/bmp at follow up please bring in all your medication with you to go over with your pcp. Contact information: [place] [address] [phone]                  The results of significant diagnostics from this hospitalization (including imaging, microbiology, ancillary and laboratory) are listed below for reference.    Significant Diagnostic Studies: ECHOCARDIOGRAM COMPLETE  Result Date: 11/18/2022    ECHOCARDIOGRAM REPORT   Patient Name:   Anna Bernard [facility] Date of Exam: 11/18/2022 Medical Rec #:  [id number]      Height:       61.0 in Accession #:    [id number]     Weight:       156.0 lb Date of Birth:  Mar 23, 1951       BSA:          1.699 m Patient Age:    82 years       BP:           162/75 mmHg Patient Gender: F              HR:           90 bpm. Exam Location:  Inpatient Procedure: 2D Echo, Color Doppler and Cardiac Doppler Indications:    Syncope R55  History:        Patient has prior history of Echocardiogram examinations, most                 recent 01/17/2022. CAD, Carotid Disease, Arrythmias:Atrial                 Fibrillation; Risk Factors:Former Smoker, Hypertension  and                 Dyslipidemia.  Sonographer:    Greer Pickerel Referring Phys: [id number] SARA-MAIZ A THOMAS  Sonographer Comments: Suboptimal subcostal window. Image acquisition challenging due to patient body habitus and Image acquisition  challenging due to respiratory motion. IMPRESSIONS  1. There is an intracavitary gradient likely related to hyperdynamic function. Left ventricular ejection fraction, by estimation, is 70 to 75%. The left ventricle has hyperdynamic function. Left ventricular endocardial border not optimally defined to evaluate regional wall motion. Indeterminate diastolic filling due to E-A fusion.  2. Right ventricular systolic function is hyperdynamic. The right ventricular size is normal.  3. Left atrial size was moderately dilated.  4. There is an echodensity in the right atrium seen on subcostal views.  5. The mitral valve is abnormal. No evidence of mitral valve regurgitation. Moderate mitral annular calcification.  6. Aortic valve gradients may be due to hyperdynamic function. The aortic valve was not well visualized. Aortic valve regurgitation is not visualized. Comparison(s): Prior images reviewed side by side. LVEF has increased. In correlated to prior cardiac imaging, echodensity may be most consistent with a prominent crista terminalis. FINDINGS  Left Ventricle: There is an intracavitary gradient likely related to hyperdynamic function. Left ventricular ejection fraction, by estimation, is 70 to 75%. The left ventricle has hyperdynamic function. Left ventricular endocardial border not optimally defined to evaluate regional wall motion. The left ventricular internal cavity size was small. There is no left ventricular hypertrophy. Indeterminate diastolic filling due to E-A fusion. Right Ventricle: The right ventricular size is normal. No increase in right ventricular wall thickness. Right ventricular systolic function is hyperdynamic. Left Atrium: Left atrial size was moderately dilated. Right Atrium: There is an echodensity in the right atrium seen on subcostal views. Right atrial size was normal in size. Pericardium: Trivial pericardial effusion is present. Mitral Valve: The mitral valve is abnormal. Moderate mitral  annular calcification. No evidence of mitral valve regurgitation. Tricuspid Valve: The tricuspid valve is normal in structure. Tricuspid valve regurgitation is not demonstrated. Aortic Valve: Aortic valve gradients may be due to hyperdynamic function. The aortic valve was not well visualized. Aortic valve regurgitation is not visualized. Aortic valve mean gradient measures 14.0 mmHg. Aortic valve peak gradient measures 23.2 mmHg. Aortic valve area, by VTI measures 1.56 cm. Pulmonic Valve: The pulmonic valve was not well visualized. Pulmonic valve regurgitation is not visualized. Aorta: The aortic root is normal in size and structure. IAS/Shunts: No atrial level shunt detected by color flow Doppler.  LEFT VENTRICLE PLAX 2D LVIDd:         2.20 cm   Diastology LVIDs:         1.40 cm   LV e' medial:    6.53 cm/s LV PW:         0.80 cm   LV E/e' medial:  8.5 LV IVS:        0.80 cm   LV e' lateral:   8.27 cm/s LVOT diam:     1.80 cm   LV E/e' lateral: 6.7 LV SV:         71 LV SV Index:   42 LVOT Area:     2.54 cm  RIGHT VENTRICLE RV S prime:     16.00 cm/s TAPSE (M-mode): 2.1 cm LEFT ATRIUM             Index        RIGHT ATRIUM  Index LA Vol (A2C):   68.3 ml 40.19 ml/m  RA Area:     11.10 cm LA Vol (A4C):   70.4 ml 41.42 ml/m  RA Volume:   18.80 ml  11.06 ml/m LA Biplane Vol: 71.0 ml 41.78 ml/m  AORTIC VALVE AV Area (Vmax):    1.81 cm AV Area (Vmean):   1.59 cm AV Area (VTI):     1.56 cm AV Vmax:           241.00 cm/s AV Vmean:          176.000 cm/s AV VTI:            0.454 m AV Peak Grad:      23.2 mmHg AV Mean Grad:      14.0 mmHg LVOT Vmax:         171.00 cm/s LVOT Vmean:        110.000 cm/s LVOT VTI:          0.279 m LVOT/AV VTI ratio: 0.61  AORTA Ao Root diam: 3.10 cm MITRAL VALVE                TRICUSPID VALVE MV Area (PHT): 4.21 cm     TR Peak grad:   8.6 mmHg MV Decel Time: 180 msec     TR Vmax:        147.00 cm/s MR Peak grad: 52.9 mmHg MR Vmax:      363.50 cm/s   SHUNTS MV E velocity: 55.30  cm/s   Systemic VTI:  0.28 m MV A velocity: 105.00 cm/s  Systemic Diam: 1.80 cm MV E/A ratio:  0.53 Rudean Haskell MD Electronically signed by Rudean Haskell MD Signature Date/Time: 11/18/2022/4:58:03 PM    Final    VAS US CAROTID  Result Date: 11/18/2022 Carotid Arterial Duplex Study Patient Name:  Anna Bernard [facility]  Date of Exam:   11/18/2022 Medical Rec #: [id number]       Accession #:    [id number] Date of Birth: [birth year]        Patient Gender: F Patient Age:   75 years Exam Location:  [facility] Procedure:      VAS US CAROTID Referring Phys: Myles Rosenthal --------------------------------------------------------------------------------  Indications:       Syncope. Risk Factors:      Hypertension, hyperlipidemia, Diabetes. Comparison Study:  02/28/2022 - Right Carotid: Velocities in the right ICA are                    consistent with a 1-39% stenosis.                     Left Carotid: Velocities in the left ICA are consistent with                    a 1-39%                    stenosis.                     Vertebrals: Bilateral vertebral arteries demonstrate                    antegrade flow.                    Subclavians: Right subclavian artery flow was disturbed.                    Normal  flow hemodynamics were seen in the left subclavian                    artery. Performing Technologist: Oliver Hum RVT  Examination Guidelines: A complete evaluation includes B-mode imaging, spectral Doppler, color Doppler, and power Doppler as needed of all accessible portions of each vessel. Bilateral testing is considered an integral part of a complete examination. Limited examinations for reoccurring indications may be performed as noted.  Right Carotid Findings: +----------+--------+--------+--------+-----------------------+--------+           PSV cm/sEDV cm/sStenosisPlaque Description     Comments +----------+--------+--------+--------+-----------------------+--------+ CCA Prox  91       16                                              +----------+--------+--------+--------+-----------------------+--------+ CCA Distal99      22              smooth and heterogenous         +----------+--------+--------+--------+-----------------------+--------+ ICA Prox  108     23              smooth and heterogenous         +----------+--------+--------+--------+-----------------------+--------+ ICA Distal61      25                                     tortuous +----------+--------+--------+--------+-----------------------+--------+ ECA       145     16                                              +----------+--------+--------+--------+-----------------------+--------+ +----------+--------+-------+--------+-------------------+           PSV cm/sEDV cmsDescribeArm Pressure (mmHG) +----------+--------+-------+--------+-------------------+ [id number]                                         +----------+--------+-------+--------+-------------------+ +---------+--------+--+--------+--+---------+ VertebralPSV cm/s48EDV cm/s11Antegrade +---------+--------+--+--------+--+---------+  Left Carotid Findings: +----------+--------+--------+--------+-----------------------+--------+           PSV cm/sEDV cm/sStenosisPlaque Description     Comments +----------+--------+--------+--------+-----------------------+--------+ CCA Prox  135     21              smooth and heterogenous         +----------+--------+--------+--------+-----------------------+--------+ CCA Distal116     27              smooth and heterogenous         +----------+--------+--------+--------+-----------------------+--------+ ICA Prox  98      23              smooth and heterogenous         +----------+--------+--------+--------+-----------------------+--------+ ICA Distal74      25                                     tortuous  +----------+--------+--------+--------+-----------------------+--------+ ECA       148     25                                              +----------+--------+--------+--------+-----------------------+--------+ +----------+--------+--------+--------+-------------------+  PSV cm/sEDV cm/sDescribeArm Pressure (mmHG) +----------+--------+--------+--------+-------------------+ Subclavian200                                         +----------+--------+--------+--------+-------------------+ +---------+--------+--+--------+--+---------+ VertebralPSV cm/s60EDV cm/s16Antegrade +---------+--------+--+--------+--+---------+   Summary: Right Carotid: Velocities in the right ICA are consistent with a 1-39% stenosis. Left Carotid: Velocities in the left ICA are consistent with a 1-39% stenosis. Vertebrals: Bilateral vertebral arteries demonstrate antegrade flow. *See table(s) above for measurements and observations.  Electronically signed by Monica Martinez MD on 11/18/2022 at 12:31:35 PM.    Final    CT HEAD WO CONTRAST (5MM)  Result Date: 11/17/2022 CLINICAL DATA:  Initial evaluation for syncope/presyncope EXAM: CT HEAD WITHOUT CONTRAST TECHNIQUE: Contiguous axial images were obtained from the base of the skull through the vertex without intravenous contrast. RADIATION DOSE REDUCTION: This exam was performed according to the departmental dose-optimization program which includes automated exposure control, adjustment of the mA and/or kV according to patient size and/or use of iterative reconstruction technique. COMPARISON:  Prior MRI from earlier the same day. FINDINGS: Brain: Mild age-related cerebral atrophy with moderate chronic microvascular ischemic disease. No acute intracranial hemorrhage. No acute large vessel territory infarct. No mass lesion or midline shift. No hydrocephalus or extra-axial fluid collection. Vascular: No abnormal hyperdense vessel. Scattered vascular  calcifications noted within the carotid siphons. Skull: Scalp soft tissues and calvarium demonstrate no acute finding. Sinuses/Orbits: Globes orbital soft tissues demonstrate no acute finding. Paranasal sinuses and mastoid air cells are clear. Other: None. IMPRESSION: 1. No acute intracranial abnormality. 2. Mild age-related cerebral atrophy with moderate chronic microvascular ischemic disease. Electronically Signed   By: Jeannine Boga M.D.   On: 11/17/2022 20:00   MR BRAIN WO CONTRAST  Result Date: 11/17/2022 CLINICAL DATA:  Initial evaluation for syncope/presyncope, stroke suspected. EXAM: MRI HEAD WITHOUT CONTRAST TECHNIQUE: Multiplanar, multiecho pulse sequences of the brain and surrounding structures were obtained without intravenous contrast. COMPARISON:  Prior study from 01/16/2022. FINDINGS: Brain: Mild age-related cerebral atrophy. Patchy and confluent T2/FLAIR hyperintensity involving the periventricular deep white matter both cerebral hemispheres, consistent with chronic small vessel ischemic disease, moderate in nature. No evidence for acute or subacute ischemia. Gray-white matter differentiation maintained. No areas of chronic cortical infarction. No acute or chronic intracranial blood products. No mass lesion, midline shift or mass effect. No hydrocephalus or extra-axial fluid collection. Pituitary gland and suprasellar region within normal limits. Vascular: Major intracranial vascular flow voids are maintained. Skull and upper cervical spine: Craniocervical junction normal. Bone marrow signal intensity within normal limits. No scalp soft tissue abnormality. Sinuses/Orbits: Prior ocular lens replacement on the right. Right gaze preference noted. Paranasal sinuses are clear. No mastoid effusion. Other: None. IMPRESSION: 1. No acute intracranial abnormality. 2. Mild age-related cerebral atrophy with moderate chronic microvascular ischemic disease. Electronically Signed   By: Jeannine Boga M.D.   On: 11/17/2022 19:58   DG CHEST PORT 1 VIEW  Result Date: 11/17/2022 CLINICAL DATA:  Syncope EXAM: PORTABLE CHEST 1 VIEW COMPARISON:  01/16/2022 FINDINGS: Cardiac size is within normal limits. Lung fields are clear of any infiltrates or pulmonary edema. There is no pleural effusion or pneumothorax. There is an electronic device superimposed over the superior mediastinum. Evaluation of this finding is limited in this single AP portable view. Degenerative changes are noted in both shoulders and both AC joints. Left hemidiaphragm is elevated with no interval change. IMPRESSION: There are no  new infiltrates or signs of pulmonary edema. Electronically Signed   By: Elmer Picker M.D.   On: 11/17/2022 17:04   DG Hip Unilat W or Wo Pelvis 2-3 Views Left  Result Date: 11/17/2022 CLINICAL DATA:  Syncope.  Pain. EXAM: DG HIP (WITH OR WITHOUT PELVIS) 2-3V LEFT COMPARISON:  01/04/2022 FINDINGS: There is no evidence of hip fracture or dislocation. There is no evidence of arthropathy or other focal bone abnormality. IMPRESSION: Negative. Electronically Signed   By: Rolm Baptise M.D.   On: 11/17/2022 14:45    Microbiology: Recent Results (from the past 240 hour(s))  Urine Culture     Status: Abnormal (Preliminary result)   Collection Time: 11/17/22  4:37 PM   Specimen: Urine, Random  Result Value Ref Range Status   Specimen Description   Final    URINE, RANDOM Performed at [facility] [address], [place], [place] [address]    Special Requests   Final    NONE Performed at [facility], [place] [address], [place], [place] [address]    Culture (A)  Final    >=100,000 COLONIES/mL ESCHERICHIA COLI SUSCEPTIBILITIES TO FOLLOW Performed at [facility], [place] [address] [place], [place] [address]    Report Status PENDING  Incomplete     Labs: Basic Metabolic Panel: Recent Labs  Lab 11/14/22 1411 11/17/22 1332 11/19/22 0410  11/20/22 0358  NA 139 139 139 141  K 4.4 4.2 3.6 3.7  CL 106 108 106 108  CO2 '23 25 25 26  '$ GLUCOSE 85 101* 77 72  BUN '14 13 13 16  '$ CREATININE 0.80 0.74 0.67 0.59  CALCIUM 8.9 8.4* 8.5* 8.6*  MG  --   --  2.2  --   PHOS  --   --  4.0  --    Liver Function Tests: Recent Labs  Lab 11/14/22 1411  AST 13  ALT 5  ALKPHOS 73  BILITOT 0.2  PROT 6.8  ALBUMIN 3.8*   No results for input(s): "LIPASE", "AMYLASE" in the last 168 hours. No results for input(s): "AMMONIA" in the last 168 hours. CBC: Recent Labs  Lab 11/14/22 1411 11/17/22 1332 11/18/22 0330 11/18/22 0702 11/19/22 0410  WBC 5.1 6.6  --   --  9.5  NEUTROABS  --  5.0  --   --   --   HGB 7.5* 7.5* 8.4* 8.3* 8.2*  HCT 24.5* 23.7* 25.7* 25.3* 25.6*  MCV 75* 72.5*  --   --  75.1*  PLT 449 437*  --   --  375   Cardiac Enzymes: No results for input(s): "CKTOTAL", "CKMB", "CKMBINDEX", "TROPONINI" in the last 168 hours. BNP: BNP (last 3 results) Recent Labs    11/18/22 0330  BNP 82.2    ProBNP (last 3 results) No results for input(s): "PROBNP" in the last 8760 hours.  CBG: Recent Labs  Lab 11/19/22 1714 11/19/22 2122 11/20/22 0451 11/20/22 0755 11/20/22 1129  GLUCAP 112* 100* 74 89 161*    FURTHER DISCHARGE INSTRUCTIONS:   Get Medicines reviewed and adjusted: Please take all your medications with you for your next visit with your Primary MD   Laboratory/radiological data: Please request your Primary MD to go over all hospital tests and procedure/radiological results at the follow up, please ask your Primary MD to get all Hospital records sent to his/her office.   In some cases, they will be blood work, cultures and biopsy results pending at the time of your discharge.  Please request that your primary care M.D. goes through all the records of your hospital data and follows up on these results.   Also Note the following: If you experience worsening of your admission symptoms, develop shortness of  breath, life threatening emergency, suicidal or homicidal thoughts you must seek medical attention immediately by calling 911 or calling your MD immediately  if symptoms less severe.   You must read complete instructions/literature along with all the possible adverse reactions/side effects for all the Medicines you take and that have been prescribed to you. Take any new Medicines after you have completely understood and accpet all the possible adverse reactions/side effects.    Do not drive when taking Pain medications or sleeping medications (Benzodaizepines)   Do not take more than prescribed Pain, Sleep and Anxiety Medications. It is not advisable to combine anxiety,sleep and pain medications without talking with your primary care practitioner   Special Instructions: If you have smoked or chewed Tobacco  in the last 2 yrs please stop smoking, stop any regular Alcohol  and or any Recreational drug use.   Wear Seat belts while driving.   Please note: You were cared for by a hospitalist during your hospital stay. Once you are discharged, your primary care physician will handle any further medical issues. Please note that NO REFILLS for any discharge medications will be authorized once you are discharged, as it is imperative that you return to your primary care physician (or establish a relationship with a primary care physician if you do not have one) for your post hospital discharge needs so that they can reassess your need for medications and monitor your lab values.     Signed:  Florencia Reasons MD, PhD, FACP  [facility] 11/20/2022, 1:16 PM

## 2022-11-20 NOTE — TOC Transition Note (Signed)
Transition of Care [facility]) - CM/SW Discharge Note   Patient Details  Name: Anna Bernard MRN: [id number] Date of Birth: 01/18/1951  Transition of [facility]) CM/SW Contact:  Rodney Booze, LCSW Phone Number: 11/20/2022, 1:35 PM   Clinical Narrative:    CSW has reached out to the patients daughter in regards to DC today. Home health was set up through [facility] also this CSW gave the patient several home care agencies to call for private care as well. At this time there are no other TOC needs and the patient will DC home with daughter and husband.   Final next level of care: Home w Home Health Services Barriers to Discharge: No Barriers Identified   Patient Goals and CMS Choice CMS Medicare.gov Compare Post Acute Care list provided to:: Other (Comment Required) (Daughter) Choice offered to / list presented to : Adult Children  Discharge Placement                    Name of family member notified: Daughter Patient and family notified of of transfer: 11/20/22  Discharge Plan and Services Additional resources added to the After Visit Summary for   In-house Referral: Clinical Social Work   Post Acute Care Choice: Home Health                    HH Arranged: Nurse's Aide, PT, OT [facility] Agency: [place] Date [place]: 11/20/22 Time [place]: 0200 Representative spoke with at [place]: [place] Determinants of Health ([place]) Interventions SDOH Screenings   Food Insecurity: No Food Insecurity (11/17/2022)  Housing: Low Risk  (11/17/2022)  Transportation Needs: No Transportation Needs (11/17/2022)  Utilities: Not At Risk (11/17/2022)  Alcohol Screen: Low Risk  (01/23/2020)  Depression (PHQ2-9): Low Risk  (11/14/2022)  Tobacco Use: Medium Risk (11/18/2022)     Readmission Risk Interventions     No data to display

## 2022-11-20 NOTE — Plan of Care (Signed)
  Problem: Education: Goal: Knowledge of condition and prescribed therapy will improve Outcome: Adequate for Discharge   Problem: Cardiac: Goal: Will achieve and/or maintain adequate cardiac output Outcome: Adequate for Discharge   Problem: Physical Regulation: Goal: Complications related to the disease process, condition or treatment will be avoided or minimized Outcome: Adequate for Discharge   Problem: Education: Goal: Knowledge of General Education information will improve Description: Including pain rating scale, medication(s)/side effects and non-pharmacologic comfort measures Outcome: Adequate for Discharge   Problem: Health Behavior/Discharge Planning: Goal: Ability to manage health-related needs will improve Outcome: Adequate for Discharge   Problem: Clinical Measurements: Goal: Ability to maintain clinical measurements within normal limits will improve Outcome: Adequate for Discharge Goal: Will remain free from infection Outcome: Adequate for Discharge Goal: Diagnostic test results will improve Outcome: Adequate for Discharge Goal: Respiratory complications will improve Outcome: Adequate for Discharge Goal: Cardiovascular complication will be avoided Outcome: Adequate for Discharge   Problem: Activity: Goal: Risk for activity intolerance will decrease Outcome: Adequate for Discharge   Problem: Nutrition: Goal: Adequate nutrition will be maintained Outcome: Adequate for Discharge   Problem: Coping: Goal: Level of anxiety will decrease Outcome: Adequate for Discharge   Problem: Elimination: Goal: Will not experience complications related to bowel motility Outcome: Adequate for Discharge Goal: Will not experience complications related to urinary retention Outcome: Adequate for Discharge   Problem: Pain Managment: Goal: General experience of comfort will improve Outcome: Adequate for Discharge   Problem: Safety: Goal: Ability to remain free from injury  will improve Outcome: Adequate for Discharge   Problem: Skin Integrity: Goal: Risk for impaired skin integrity will decrease Outcome: Adequate for Discharge   

## 2022-11-21 ENCOUNTER — Encounter (HOSPITAL_COMMUNITY): Payer: Self-pay | Admitting: Gastroenterology

## 2022-11-21 LAB — URINE CULTURE: Culture: >=100000 — AB

## 2022-11-23 ENCOUNTER — Encounter (HOSPITAL_COMMUNITY): Payer: Medicare Other

## 2022-11-25 ENCOUNTER — Other Ambulatory Visit: Payer: Self-pay | Admitting: Family Medicine

## 2022-11-25 DIAGNOSIS — I2699 Other pulmonary embolism without acute cor pulmonale: Secondary | ICD-10-CM

## 2022-11-27 DIAGNOSIS — I119 Hypertensive heart disease without heart failure: Secondary | ICD-10-CM | POA: Diagnosis not present

## 2022-11-27 DIAGNOSIS — G20A1 Parkinson's disease without dyskinesia, without mention of fluctuations: Secondary | ICD-10-CM | POA: Diagnosis not present

## 2022-11-27 DIAGNOSIS — E119 Type 2 diabetes mellitus without complications: Secondary | ICD-10-CM | POA: Diagnosis not present

## 2022-11-27 DIAGNOSIS — I480 Paroxysmal atrial fibrillation: Secondary | ICD-10-CM | POA: Diagnosis not present

## 2022-11-27 DIAGNOSIS — G901 Familial dysautonomia [Riley-Day]: Secondary | ICD-10-CM | POA: Diagnosis not present

## 2022-11-28 ENCOUNTER — Telehealth: Payer: Self-pay

## 2022-11-28 DIAGNOSIS — G901 Familial dysautonomia [Riley-Day]: Secondary | ICD-10-CM | POA: Diagnosis not present

## 2022-11-28 DIAGNOSIS — G20A1 Parkinson's disease without dyskinesia, without mention of fluctuations: Secondary | ICD-10-CM | POA: Diagnosis not present

## 2022-11-28 DIAGNOSIS — I480 Paroxysmal atrial fibrillation: Secondary | ICD-10-CM | POA: Diagnosis not present

## 2022-11-28 DIAGNOSIS — I119 Hypertensive heart disease without heart failure: Secondary | ICD-10-CM | POA: Diagnosis not present

## 2022-11-28 DIAGNOSIS — E119 Type 2 diabetes mellitus without complications: Secondary | ICD-10-CM | POA: Diagnosis not present

## 2022-11-28 NOTE — Telephone Encounter (Signed)
Tonya from [place] calling for nursing verbal orders as follows:  1 time(s) weekly for 5 week(s).  Verbal orders given per [facility] protocol.   RN had further questions about what medications patient is supposed to be on. Per family, patient was supposed to stop famotidine and start protonix. However, both of these are on medication list.   She is also asking about rosuvastatin. This medication was prescribed by previous PCP in 2022. Advised that patient should bring all medications to appointment tomorrow for med rec.   Forwarding to PCP.   Talbot Grumbling, RN

## 2022-11-28 NOTE — Telephone Encounter (Signed)
[place] RN calls nurse line again in regards to patient.   Kenney Houseman reports she would like to add orders for a medical social worker.   VM left giving verbal order.

## 2022-11-28 NOTE — Progress Notes (Unsigned)
    SUBJECTIVE:   CHIEF COMPLAINT / HPI:   Hospital follow up- admitted to [facility] from 11/17/22-11/20/22 for syncope and falls and found to have symptomatic anemia. Had EGD 3/1 with Dr Benson [place] showing Patchy mildly erythematous mucosa with stigmata of recent bleeding was found in the gastric fundus, Treated with a monopolar probe. Eliquis was restarted 11/19/22. Also found to have urinary symptoms and treated with fosphomycin x1 per ID consult. She was recommended to go to SNF and family elected for home with home health.   Since going home, no abdominal pain, no fevers, no blood in stool or dark black stools. No chest pain, palpitations, dyspnea, or shortness of breath. No episodes of syncope. Brought home meds and changes to do med rec today.  Feeling more energy. Dysuria improved, still occassional but not regularly. No fevers or chills or flank pain. Still with nocturia. Has a purewick at home that she uses. RN supposed to come out to home once a week, but found [name] getting work with PT, OT, and SW. So far PT and RN have come to home, do not need other referrals from me at this time.  PERTINENT  PMH / PSH: h/o ESBL UTI, HTN, paroxysmal Afib, Parkinsons disease, HLD, h/o PE, H/o NSTEMI, Iron def anemia due to chronic blood loss, hypothyroidism, GERD,   OBJECTIVE:   BP (!) 118/58   Pulse 74   Ht 5\' 1"  (1.549 m)   Wt 156 lb (70.8 kg)   SpO2 98%   BMI 29.48 kg/m   General: A&O, NAD HEENT: No sign of trauma, EOM grossly intact Cardiac: RRR, no + murmur Respiratory: CTAB, normal WOB, no w/c/r GI: Soft, NTTP, non-distended  Extremities: NTTP, no peripheral edema. Neuro: Normal gait, moves all four extremities appropriately. Psych: Appropriate mood and affect   ASSESSMENT/PLAN:   Paroxysmal atrial fibrillation (HCC) Restart Eliquis in hospital, CBC repeated today and Hgb stable and improving after IV iron in hospital Continue home medications  Iron deficiency anemia due to chronic  blood loss CBC repeated today and improving, s/p IV iron in hospital, continue Eliquis  Gastroesophageal reflux disease Now on PPI, stop famotidine, continue per GI recs     Anna Chime, MD [place]

## 2022-11-29 ENCOUNTER — Encounter: Payer: Self-pay | Admitting: Family Medicine

## 2022-11-29 ENCOUNTER — Ambulatory Visit (INDEPENDENT_AMBULATORY_CARE_PROVIDER_SITE_OTHER): Payer: Medicare Other | Admitting: Family Medicine

## 2022-11-29 VITALS — BP 118/58 | HR 74 | Ht 61.0 in | Wt 156.0 lb

## 2022-11-29 DIAGNOSIS — I10 Essential (primary) hypertension: Secondary | ICD-10-CM

## 2022-11-29 DIAGNOSIS — K219 Gastro-esophageal reflux disease without esophagitis: Secondary | ICD-10-CM

## 2022-11-29 DIAGNOSIS — D500 Iron deficiency anemia secondary to blood loss (chronic): Secondary | ICD-10-CM | POA: Diagnosis not present

## 2022-11-29 DIAGNOSIS — I480 Paroxysmal atrial fibrillation: Secondary | ICD-10-CM | POA: Diagnosis not present

## 2022-11-29 NOTE — Patient Instructions (Signed)
It was wonderful to see you today.  Please bring ALL of your medications with you to every visit.   Today we talked about:  Sorted out her meds  Checking lab work today and will call tomorrow  Thank you for choosing [facility].   Please call [phone] with any questions about today's appointment.  Please arrive at least 15 minutes prior to your scheduled appointments.   If you had blood work today, I will send you a MyChart message or a letter if results are normal. Otherwise, I will give you a call.   If you had a referral placed, they will call you to set up an appointment. Please give [place] a call if you don't hear back in the next 2 weeks.   If you need additional refills before your next appointment, please call your pharmacy first.   Yehuda Savannah, MD  Family Medicine

## 2022-11-30 DIAGNOSIS — E119 Type 2 diabetes mellitus without complications: Secondary | ICD-10-CM | POA: Diagnosis not present

## 2022-11-30 DIAGNOSIS — I480 Paroxysmal atrial fibrillation: Secondary | ICD-10-CM | POA: Diagnosis not present

## 2022-11-30 DIAGNOSIS — G20A1 Parkinson's disease without dyskinesia, without mention of fluctuations: Secondary | ICD-10-CM | POA: Diagnosis not present

## 2022-11-30 DIAGNOSIS — I119 Hypertensive heart disease without heart failure: Secondary | ICD-10-CM | POA: Diagnosis not present

## 2022-11-30 DIAGNOSIS — G901 Familial dysautonomia [Riley-Day]: Secondary | ICD-10-CM | POA: Diagnosis not present

## 2022-11-30 LAB — CBC
Hematocrit: 30.5 % — ABNORMAL LOW (ref 34.0–46.6)
Hemoglobin: 9.6 g/dL — ABNORMAL LOW (ref 11.1–15.9)
MCH: 24.7 pg — ABNORMAL LOW (ref 26.6–33.0)
MCHC: 31.5 g/dL (ref 31.5–35.7)
MCV: 79 fL (ref 79–97)
Platelets: 348 10*3/uL (ref 150–450)
RBC: 3.88 x10E6/uL (ref 3.77–5.28)
RDW: 19.1 % — ABNORMAL HIGH (ref 11.7–15.4)
WBC: 7.8 10*3/uL (ref 3.4–10.8)

## 2022-11-30 LAB — BASIC METABOLIC PANEL
BUN/Creatinine Ratio: 22 (ref 12–28)
BUN: 15 mg/dL (ref 8–27)
CO2: 24 mmol/L (ref 20–29)
Calcium: 8.9 mg/dL (ref 8.7–10.3)
Chloride: 105 mmol/L (ref 96–106)
Creatinine, Ser: 0.68 mg/dL (ref 0.57–1.00)
Glucose: 83 mg/dL (ref 70–99)
Potassium: 4.8 mmol/L (ref 3.5–5.2)
Sodium: 142 mmol/L (ref 134–144)
eGFR: 94 mL/min/{1.73_m2} (ref >59)

## 2022-11-30 NOTE — Assessment & Plan Note (Signed)
Restart Eliquis in hospital, CBC repeated today and Hgb stable and improving after IV iron in hospital Continue home medications

## 2022-11-30 NOTE — Assessment & Plan Note (Signed)
Now on PPI, stop famotidine, continue per GI recs

## 2022-11-30 NOTE — Assessment & Plan Note (Signed)
CBC repeated today and improving, s/p IV iron in hospital, continue Eliquis

## 2022-12-02 DIAGNOSIS — I480 Paroxysmal atrial fibrillation: Secondary | ICD-10-CM | POA: Diagnosis not present

## 2022-12-02 DIAGNOSIS — G20A1 Parkinson's disease without dyskinesia, without mention of fluctuations: Secondary | ICD-10-CM | POA: Diagnosis not present

## 2022-12-02 DIAGNOSIS — G901 Familial dysautonomia [Riley-Day]: Secondary | ICD-10-CM | POA: Diagnosis not present

## 2022-12-02 DIAGNOSIS — E119 Type 2 diabetes mellitus without complications: Secondary | ICD-10-CM | POA: Diagnosis not present

## 2022-12-02 DIAGNOSIS — I119 Hypertensive heart disease without heart failure: Secondary | ICD-10-CM | POA: Diagnosis not present

## 2022-12-03 ENCOUNTER — Other Ambulatory Visit: Payer: Self-pay | Admitting: Family Medicine

## 2022-12-05 ENCOUNTER — Other Ambulatory Visit: Payer: Self-pay | Admitting: Family Medicine

## 2022-12-05 DIAGNOSIS — E039 Hypothyroidism, unspecified: Secondary | ICD-10-CM

## 2022-12-06 ENCOUNTER — Other Ambulatory Visit (HOSPITAL_BASED_OUTPATIENT_CLINIC_OR_DEPARTMENT_OTHER): Payer: Medicare Other

## 2022-12-06 ENCOUNTER — Encounter (HOSPITAL_BASED_OUTPATIENT_CLINIC_OR_DEPARTMENT_OTHER): Payer: Medicare Other

## 2022-12-06 DIAGNOSIS — G901 Familial dysautonomia [Riley-Day]: Secondary | ICD-10-CM | POA: Diagnosis not present

## 2022-12-06 DIAGNOSIS — E119 Type 2 diabetes mellitus without complications: Secondary | ICD-10-CM | POA: Diagnosis not present

## 2022-12-06 DIAGNOSIS — I119 Hypertensive heart disease without heart failure: Secondary | ICD-10-CM | POA: Diagnosis not present

## 2022-12-06 DIAGNOSIS — I480 Paroxysmal atrial fibrillation: Secondary | ICD-10-CM | POA: Diagnosis not present

## 2022-12-06 DIAGNOSIS — G20A1 Parkinson's disease without dyskinesia, without mention of fluctuations: Secondary | ICD-10-CM | POA: Diagnosis not present

## 2022-12-07 DIAGNOSIS — M205X1 Other deformities of toe(s) (acquired), right foot: Secondary | ICD-10-CM | POA: Diagnosis not present

## 2022-12-08 ENCOUNTER — Telehealth: Payer: Self-pay | Admitting: Cardiology

## 2022-12-08 DIAGNOSIS — I480 Paroxysmal atrial fibrillation: Secondary | ICD-10-CM | POA: Diagnosis not present

## 2022-12-08 DIAGNOSIS — I119 Hypertensive heart disease without heart failure: Secondary | ICD-10-CM | POA: Diagnosis not present

## 2022-12-08 DIAGNOSIS — G20A1 Parkinson's disease without dyskinesia, without mention of fluctuations: Secondary | ICD-10-CM | POA: Diagnosis not present

## 2022-12-08 DIAGNOSIS — G901 Familial dysautonomia [Riley-Day]: Secondary | ICD-10-CM | POA: Diagnosis not present

## 2022-12-08 DIAGNOSIS — E119 Type 2 diabetes mellitus without complications: Secondary | ICD-10-CM | POA: Diagnosis not present

## 2022-12-08 NOTE — Telephone Encounter (Signed)
Pt c/o BP issue: STAT if pt c/o blurred vision, one-sided weakness or slurred speech  1. What are your last 5 BP readings?     180/80 170/84  2. Are you having any other symptoms (ex. Dizziness, headache, blurred vision, passed out)?   No  3. What is your BP issue?    Caller called to report patient's BP has been trending high.  Caller stated he could see the patient palpitations (HR 80-82 range) on chest.  Caller stated patient can be contacted directly or can call daughter at [phone]

## 2022-12-08 NOTE — Telephone Encounter (Signed)
Called patient about message. Patient's husband stated patient was very frustrated with her home health personal today, and he thinks this might have contributed to her elevated BP. Patient's BP 180/80, 170/84, HR 80, 82. Patient is having some palpitations, but no chest pain or SOB. Patient has been using a lot of salt on her food the last day as well. Patient is taking lopressor 12.5 mg BID. Consulted DOD, Dr. Gasper Sells, he is okay for patient to increase her lopressor 25 mg tonight and monitor BP closely, if BP does not improve call our office to get in to see someone that we might need to make medication changes. Encouraged patient to go to ER if her BP gets worse, or she starts having SOB or chest pain. Encouraged them to keep a low salt diet also. Patient's husband and her agreed to plan.

## 2022-12-12 DIAGNOSIS — I119 Hypertensive heart disease without heart failure: Secondary | ICD-10-CM | POA: Diagnosis not present

## 2022-12-12 DIAGNOSIS — I480 Paroxysmal atrial fibrillation: Secondary | ICD-10-CM | POA: Diagnosis not present

## 2022-12-12 DIAGNOSIS — E119 Type 2 diabetes mellitus without complications: Secondary | ICD-10-CM | POA: Diagnosis not present

## 2022-12-12 DIAGNOSIS — G20A1 Parkinson's disease without dyskinesia, without mention of fluctuations: Secondary | ICD-10-CM | POA: Diagnosis not present

## 2022-12-12 DIAGNOSIS — G901 Familial dysautonomia [Riley-Day]: Secondary | ICD-10-CM | POA: Diagnosis not present

## 2022-12-14 ENCOUNTER — Telehealth: Payer: Self-pay | Admitting: *Deleted

## 2022-12-14 ENCOUNTER — Other Ambulatory Visit: Payer: Self-pay | Admitting: Family Medicine

## 2022-12-14 DIAGNOSIS — K31811 Angiodysplasia of stomach and duodenum with bleeding: Secondary | ICD-10-CM | POA: Diagnosis not present

## 2022-12-14 DIAGNOSIS — D509 Iron deficiency anemia, unspecified: Secondary | ICD-10-CM | POA: Diagnosis not present

## 2022-12-14 DIAGNOSIS — Z1231 Encounter for screening mammogram for malignant neoplasm of breast: Secondary | ICD-10-CM

## 2022-12-14 NOTE — Progress Notes (Signed)
Mammogram ordered per patient request.

## 2022-12-14 NOTE — Telephone Encounter (Signed)
Husband called and states that patient is needing an order for her to get her mammogram performed.  Will forward to MD.  Johnney Ou

## 2022-12-14 NOTE — Telephone Encounter (Signed)
Family informed and I provided them the number to call and schedule.  Deshawna Mcneece,CMA

## 2022-12-15 DIAGNOSIS — E119 Type 2 diabetes mellitus without complications: Secondary | ICD-10-CM | POA: Diagnosis not present

## 2022-12-15 DIAGNOSIS — I119 Hypertensive heart disease without heart failure: Secondary | ICD-10-CM | POA: Diagnosis not present

## 2022-12-15 DIAGNOSIS — I480 Paroxysmal atrial fibrillation: Secondary | ICD-10-CM | POA: Diagnosis not present

## 2022-12-15 DIAGNOSIS — G20A1 Parkinson's disease without dyskinesia, without mention of fluctuations: Secondary | ICD-10-CM | POA: Diagnosis not present

## 2022-12-15 DIAGNOSIS — G901 Familial dysautonomia [Riley-Day]: Secondary | ICD-10-CM | POA: Diagnosis not present

## 2022-12-19 DIAGNOSIS — H34831 Tributary (branch) retinal vein occlusion, right eye, with macular edema: Secondary | ICD-10-CM | POA: Diagnosis not present

## 2022-12-20 DIAGNOSIS — E119 Type 2 diabetes mellitus without complications: Secondary | ICD-10-CM | POA: Diagnosis not present

## 2022-12-20 DIAGNOSIS — G20A1 Parkinson's disease without dyskinesia, without mention of fluctuations: Secondary | ICD-10-CM | POA: Diagnosis not present

## 2022-12-20 DIAGNOSIS — G901 Familial dysautonomia [Riley-Day]: Secondary | ICD-10-CM | POA: Diagnosis not present

## 2022-12-20 DIAGNOSIS — I119 Hypertensive heart disease without heart failure: Secondary | ICD-10-CM | POA: Diagnosis not present

## 2022-12-20 DIAGNOSIS — I480 Paroxysmal atrial fibrillation: Secondary | ICD-10-CM | POA: Diagnosis not present

## 2022-12-21 ENCOUNTER — Other Ambulatory Visit: Payer: Self-pay | Admitting: Family Medicine

## 2022-12-21 DIAGNOSIS — I480 Paroxysmal atrial fibrillation: Secondary | ICD-10-CM | POA: Diagnosis not present

## 2022-12-21 DIAGNOSIS — G901 Familial dysautonomia [Riley-Day]: Secondary | ICD-10-CM | POA: Diagnosis not present

## 2022-12-21 DIAGNOSIS — I119 Hypertensive heart disease without heart failure: Secondary | ICD-10-CM | POA: Diagnosis not present

## 2022-12-21 DIAGNOSIS — G20A1 Parkinson's disease without dyskinesia, without mention of fluctuations: Secondary | ICD-10-CM | POA: Diagnosis not present

## 2022-12-21 DIAGNOSIS — E039 Hypothyroidism, unspecified: Secondary | ICD-10-CM

## 2022-12-21 DIAGNOSIS — E119 Type 2 diabetes mellitus without complications: Secondary | ICD-10-CM | POA: Diagnosis not present

## 2022-12-21 DIAGNOSIS — I2699 Other pulmonary embolism without acute cor pulmonale: Secondary | ICD-10-CM

## 2022-12-21 DIAGNOSIS — M545 Low back pain, unspecified: Secondary | ICD-10-CM

## 2022-12-22 DIAGNOSIS — G901 Familial dysautonomia [Riley-Day]: Secondary | ICD-10-CM | POA: Diagnosis not present

## 2022-12-22 DIAGNOSIS — I480 Paroxysmal atrial fibrillation: Secondary | ICD-10-CM | POA: Diagnosis not present

## 2022-12-22 DIAGNOSIS — I119 Hypertensive heart disease without heart failure: Secondary | ICD-10-CM | POA: Diagnosis not present

## 2022-12-22 DIAGNOSIS — G20A1 Parkinson's disease without dyskinesia, without mention of fluctuations: Secondary | ICD-10-CM | POA: Diagnosis not present

## 2022-12-22 DIAGNOSIS — E119 Type 2 diabetes mellitus without complications: Secondary | ICD-10-CM | POA: Diagnosis not present

## 2022-12-26 NOTE — Progress Notes (Unsigned)
Cardiology Office Note:    Date:  12/27/2022   ID:  Anna Bernard, DOB 04/21/1951, MRN [id number]  PCP:  Billey Co, MD   [facility] Providers Cardiologist:  Armanda Magic, MD     Referring MD: Billey Co, MD   Chief Complaint: hospital follow-up  History of Present Illness:    Anna Bernard is a pleasant 72 y.o. female with a hx of PAF, hyperlipidemia, HTN, Parkinson's disease, prediabetes, vitamin B12 deficiency, microcytic anemia, chronic low back pain, and multiple falls.  History of paroxysmal atrial fibrillation occurring in the setting of sepsis with UTI and pulmonary embolism.  She converted to sinus rhythm on her own.  Had an elevated troponin at that hospitalization with a peak of 553 with coronary CTA showing only mild nonobstructive CAD and nonocclusive right PE.  2D echo 01/17/2022 EF 60 to 65%, mild HK of left ventricular, basal inferior lateral wall, moderate MAC, aortic sclerosis without stenosis, cannot exclude PFO, no visual change from prior. She was treated with VTE dosing Eliquis at discharge and admitted to SNF for rehab.  At office visit with Ronie Spies, PA, on 02/08/2022, she reported longstanding history of dependent lower extremity edema (R>L) that has historically been responsive to compression hose.  She saw [facility] for this issue many years prior.  Deep reflux with possible component of lymphedema was suspected that time and conservative measures were advised.  Last cardiology clinic visit was 11/15/2022 with Dr. Mayford Knife at which time she reported a recent episode of syncope while in the bathroom. She was sitting in a chair and told her husband she felt sleepy and her eyes rolled back in her head.  Was unresponsive for about 5 minutes prior to coming to.  He did not call EMS.  It then occurred a few days ago after taking a hot shower.  She was sitting in the shower and did fine but after her husband and daughter got her out of the shower she was getting  dried off and passed out for 1 to 2 minutes. No seizure activity. Cardiac monitor was placed and soon follow-up scheduled.  Admission 2/29-11/20/22 following another episode of syncope. She reported feeling her heart racing and becoming more short of breath prior to passing out. She was wearing her event monitor at the time.  She was found to have hemoglobin decreased to 7.5 from 10.3.  Head CT and MRI were negative.  No evidence of carotid artery occlusion. Symptoms thought to be secondary to orthostasis related to volume depletion in the setting of symptomatic anemia as well as dysautonomia/Parkinson's. EGD 3/1 revealed patchy mildly erythematous mucosa with stigmata of recent bleeding in the gastric fundus, treated with a monopolar probe. GI recommended resuming Eliquis on 11/19/2022 and continue PPI and iron supplement.  She maintained sinus rhythm during hospitalization.  She was treated for UTI/ESBL colonization.  She had hypoglycemic event down to 65 on 3/2, improved with orange juice.  Family refused SNF, patient discharged home with home health.  Today, she is here with her daughter. Is traveling in wheelchair. Lives at home with husband, daughter lives close by. Seeing PCP once monthly, was taken off iron for now. Monitors home BP which has been well controled. Working with PT at home. Feeling well, no specific cardiac complaints. No bleeding concerns. She denies chest pain, shortness of breath, lower extremity edema, fatigue, palpitations, melena, hematuria, hemoptysis, diaphoresis, weakness, presyncope, syncope, orthopnea, and PND.  Past Medical History:  Diagnosis Date  Anemia, unspecified    Carotid artery disease    Hyperlipidemia, unspecified    Hypertension    Hypothyroidism, unspecified    Mild CAD    PAF (paroxysmal atrial fibrillation)    Pre-diabetes    Pulmonary emboli    Stenosis of right subclavian artery    possible by duplex 2023   Vitamin B12 deficiency     Past Surgical  History:  Procedure Laterality Date   ABDOMINAL HYSTERECTOMY     ENTEROSCOPY N/A 11/18/2022   Procedure: ENTEROSCOPY;  Surgeon: Jeani HawkingHung, Patrick, MD;  Location: [facility] ENDOSCOPY;  Service: Gastroenterology;  Laterality: N/A;   GIVENS CAPSULE STUDY N/A 04/16/2018   Procedure: GIVENS CAPSULE STUDY;  Surgeon: Jeani HawkingHung, Patrick, MD;  [facility];  Service: Endoscopy;  Laterality: N/A;   HOT HEMOSTASIS N/A 11/18/2022   Procedure: HOT HEMOSTASIS (ARGON PLASMA COAGULATION/BICAP);  Surgeon: Jeani HawkingHung, Patrick, MD;  [facility];  Service: Gastroenterology;  Laterality: N/A;   PARS PLANA VITRECTOMY Right 08/16/2020   Procedure: 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;  Surgeon: Carmela RimaPatel, Narendra, MD;  [facility];  Service: Ophthalmology;  Laterality: Right;   PARS PLANA VITRECTOMY Right 09/29/2020   Procedure: PARS PLANA VITRECTOMY WITH 25 GAUGE - ENDOCAUTRY;  Surgeon: Carmela RimaPatel, Narendra, MD;  [facility];  Service: Ophthalmology;  Laterality: Right;   PHOTOCOAGULATION WITH LASER Right 08/16/2020   Procedure: PHOTOCOAGULATION WITH LASER;  Surgeon: Carmela RimaPatel, Narendra, MD;  [facility];  Service: Ophthalmology;  Laterality: Right;   PHOTOCOAGULATION WITH LASER Right 09/29/2020   Procedure: PHOTOCOAGULATION WITH LASER;  Surgeon: Carmela RimaPatel, Narendra, MD;  [facility];  Service: Ophthalmology;  Laterality: Right;   SHOULDER SURGERY     TONSILLECTOMY     TUBAL LIGATION      Current Medications: Current Meds  Medication Sig   Carbidopa-Levodopa ER (SINEMET CR) 25-100 MG tablet controlled release NEW PRESCRIPTION REQUEST: TAKE THREE TABLETS BY MOUTH AT 7:00AM and TAKE TWO TABLETS BY MOUTH AT 11:00AM and TAKE TWO TABLETS BY MOUTH AT 4:00PM   diclofenac Sodium (VOLTAREN) 1 % GEL NEW PRESCRIPTION REQUEST: APPLY TWO GRAM TOPICALLY TO FEET TWICE DAILY   ELIQUIS 5 MG TABS tablet NEW PRESCRIPTION REQUEST: TAKE ONE TABLET BY MOUTH TWICE DAILY   ferrous sulfate 325 (65 FE) MG  tablet TAKE 1 TABLET BY MOUTH EVERY DAY WITH BREAKFAST (Patient taking differently: Take 325 mg by mouth daily with breakfast.)   HYDROcodone-acetaminophen (NORCO/VICODIN) 5-325 MG tablet Take 1 tablet by mouth every 6 (six) hours as needed for moderate pain or severe pain.   levothyroxine (SYNTHROID) 100 MCG tablet NEW PRESCRIPTION REQUEST: TAKE ONE TABLET BY MOUTH DAILY   lidocaine (LIDODERM) 5 % Place 1 patch onto the skin daily. Remove & Discard patch within 12 hours or as directed by MD (Patient taking differently: Place 1 patch onto the skin daily as needed (for pain- Remove & Discard patch within 12 hours or as directed by MD).)   lidocaine (LIDODERM) 5 % Place 1 patch onto the skin daily. Remove & Discard patch within 12 hours or as directed by MD To left hip   lidocaine (LIDODERM) 5 % NEW PRESCRIPTION REQUEST: APPLY ONE PATCH TOPICALLY DAILY IN THE MORNING   melatonin 3 MG TABS tablet Take 3 mg by mouth at bedtime as needed (For sleep).   metoprolol tartrate (LOPRESSOR) 25 MG tablet NEW PRESCRIPTION REQUEST: TAKE 1/2 TABLET BY MOUTH TWICE DAILY   Multiple Vitamin (MULTIVITAMIN WITH MINERALS) TABS Take 1 tablet by  mouth daily with breakfast.   pantoprazole (PROTONIX) 40 MG tablet NEW PRESCRIPTION REQUEST: TAKE ONE TABLET BY MOUTH TWICE DAILY   polyethylene glycol (MIRALAX / GLYCOLAX) 17 g packet Take 17 g by mouth 2 (two) times daily.   rosuvastatin (CRESTOR) 10 MG tablet Take 1 tablet (10 mg total) by mouth every evening.   senna-docusate (SENOKOT-S) 8.6-50 MG tablet Take 1 tablet by mouth at bedtime. Hold if diarrhea   TYLENOL 8 HOUR ARTHRITIS PAIN 650 MG CR tablet Take 1,300 mg by mouth every 8 (eight) hours as needed for pain.     Allergies:   Patient has no allergy information on record.   Social History   Socioeconomic History   Marital status: Married    Spouse name: Not on file   Number of children: Not on file   Years of education: Not on file   Highest education level: Not  on file  Occupational History   Not on file  Tobacco Use   Smoking status: Former    Passive exposure: Past   Smokeless tobacco: Never   Tobacco comments:    quit 30+ years ago  Vaping Use   Vaping Use: Never used  Substance and Sexual Activity   Alcohol use: No    Alcohol/week: 0.0 standard drinks of alcohol   Drug use: No   Sexual activity: Not Currently  Other Topics Concern   Not on file  Social History Narrative   Right handed   Lives with husband in a one story home.   Caffeine once in a while   Social Determinants of Health   Financial Resource Strain: Not on file  Food Insecurity: No Food Insecurity (11/17/2022)   Hunger Vital Sign    Worried About Running Out of Food in the Last Year: Never true    Ran Out of Food in the Last Year: Never true  Transportation Needs: No Transportation Needs (11/17/2022)   PRAPARE - Administrator, Civil Service (Medical): No    Lack of Transportation (Non-Medical): No  Physical Activity: Not on file  Stress: Not on file  Social Connections: Not on file     Family History: The patient's family history includes Cancer in her sister; Renal Disease in her sister.  ROS:   Please see the history of present illness.   All other systems reviewed and are negative.  Labs/Other Studies Reviewed:    The following studies were reviewed today:  Cardiac Monitor 12/20/22  Predominant rhythm was normal sinus rhythm with an average heart rate of 78 bpm and ranged from 65 to 104 bpm   1 episode of nonsustained ventricular tachycardia for 5 beats   Echo 11/18/22  1. There is an intracavitary gradient likely related to hyperdynamic  function. Left ventricular ejection fraction, by estimation, is 70 to 75%.  The left ventricle has hyperdynamic function. Left ventricular endocardial  border not optimally defined to  evaluate regional wall motion. Indeterminate diastolic filling due to E-A  fusion.   2. Right ventricular systolic  function is hyperdynamic. The right  ventricular size is normal.   3. Left atrial size was moderately dilated.   4. There is an echodensity in the right atrium seen on subcostal views.   5. The mitral valve is abnormal. No evidence of mitral valve  regurgitation. Moderate mitral annular calcification.   6. Aortic valve gradients may be due to hyperdynamic function. The aortic  valve was not well visualized. Aortic valve regurgitation is not  visualized.   Comparison(s): Prior images reviewed side by side. LVEF has increased. In  correlated to prior cardiac imaging, echodensity may be most consistent  with a prominent crista terminalis.  Carotid Duplex 11/18/22 Summary:  Right Carotid: Velocities in the right ICA are consistent with a 1-39%  stenosis.   Left Carotid: Velocities in the left ICA are consistent with a 1-39%  stenosis.   Vertebrals: Bilateral vertebral arteries demonstrate antegrade flow.   *See table(s) above for measurements and observations.  Coronary CTA 01/18/22 IMPRESSION: 1. Coronary calcium score of 212. This was 60 percentile for age and sex matched control.   2. Normal coronary origin with right dominance.   3. CAD-RADS 2. Mild non-obstructive CAD (25-49%). Consider non-atherosclerotic causes of chest pain. Consider preventive therapy and risk factor modification.   4. Moderate mitral annular calcification.    Recent Labs: 11/14/2022: ALT 5 11/18/2022: B Natriuretic Peptide 82.2; TSH 0.620 11/19/2022: Magnesium 2.2 11/29/2022: BUN 15; Creatinine, Ser 0.68; Hemoglobin 9.6; Platelets 348; Potassium 4.8; Sodium 142  Recent Lipid Panel    Component Value Date/Time   CHOL 133 01/17/2022 0234   CHOL 160 06/29/2021 1044   TRIG 65 01/17/2022 0234   HDL 48 01/17/2022 0234   HDL 69 06/29/2021 1044   CHOLHDL 2.8 01/17/2022 0234   VLDL 13 01/17/2022 0234   LDLCALC 72 01/17/2022 0234   LDLCALC 79 06/29/2021 1044     Risk Assessment/Calculations:     CHA2DS2-VASc Score = 3   This indicates a 3.2% annual risk of stroke. The patient's score is based upon: CHF History: 0 HTN History: 1 Diabetes History: 0 Stroke History: 0 Vascular Disease History: 0 Age Score: 1 Gender Score: 1          Physical Exam:    VS:  BP 128/68   Pulse 77   Ht 5\' 1"  (1.549 m)   Wt 156 lb (70.8 kg)   SpO2 97%   BMI 29.48 kg/m     Wt Readings from Last 3 Encounters:  12/27/22 156 lb (70.8 kg)  11/29/22 156 lb (70.8 kg)  11/20/22 157 lb 2.3 oz (71.3 kg)     GEN:  Well nourished, well developed in no acute distress HEENT: Normal NECK: No JVD; No carotid bruits CARDIAC: RRR, no murmurs, rubs, gallops RESPIRATORY:  Clear to auscultation without rales, wheezing or rhonchi  ABDOMEN: Soft, non-tender, non-distended MUSCULOSKELETAL:  No edema; No deformity. 2+ pedal pulses, equal bilaterally SKIN: Warm and dry NEUROLOGIC:  Alert and oriented x 3 PSYCHIATRIC:  Normal affect   EKG:  EKG is not ordered today.      Diagnoses:    1. Syncope and collapse   2. Bilateral carotid artery stenosis   3. PAF (paroxysmal atrial fibrillation)   4. Essential hypertension   5. Hyperlipidemia LDL goal <70   6. Coronary artery disease involving native coronary artery of native heart without angina pectoris   7. Chronic anticoagulation   8. Anemia, unspecified type   9. History of pulmonary embolism    Assessment and Plan:     Syncope and collapse: Feeling well since hospital discharge. No further episodes of presyncope, syncope. Feels that symptoms improved significantly with correction of anemia. Results of cardiac monitor reviewed in detail with the patient and her daughter.   PAF on chronic anticoagulation: Clinically appears to be in sinus rhythm on exam.  HR is well-controlled.  Continue Eliquis 5 mg twice daily for stroke prevention which is appropriate dose. Continue metoprolol for  rate control.  Symptomatic anemia: Hgb 9.6 on 11/29/22. Feeling  better since iron infusions during admission. Management per PCP.   History of PE on chronic anticoagulation: Nonocclusive right-sided pulmonary embolism on CT 01/2022 treated with DOAC. Brief interruption in Eliquis therapy during hospitalization 11/2022. Has resumed Eliquis. She denies shortness of breath, dyspnea, orthopnea, PND.  Continue Eliquis.  CAD without angina: Mild nonobstructive CAD (25 to 49%) with CAC score 212 (84% age/sex/race matched controls) on coronary CTA 01/2022. She denies chest pain, dyspnea, or other symptoms concerning for angina.  No indication for further ischemic evaluation at this time.  She is not on aspirin due to [facility]. Encouraged her to resume rosuvastatin as noted below.   Hyperlipidemia LDL goal < 70: States statin was discontinued due to LDL felt to be well-controlled.  Emphasized the importance of LDL goal < 70. She is agreeable to resume rosuvastatin 10 mg daily.   Carotid artery disease: Mild bilateral stenosis 1-39% on carotid duplex 11/18/22. She is asymptomatic. Continue to monitor clinically at this time.     Disposition: 6 months with Dr. Mayford Knife  Medication Adjustments/Labs and Tests Ordered: Current medicines are reviewed at length with the patient today.  Concerns regarding medicines are outlined above.  No orders of the defined types were placed in this encounter.  Meds ordered this encounter  Medications   rosuvastatin (CRESTOR) 10 MG tablet    Sig: Take 1 tablet (10 mg total) by mouth every evening.    Dispense:  90 tablet    Refill:  3    Patient Instructions  Medication Instructions:   RESTART Rosuvastatin one (1) tablet by mouth ( 10 mg) daily.   *If you need a refill on your cardiac medications before your next appointment, please call your pharmacy*   Lab Work:  None ordered.  If you have labs (blood work) drawn today and your tests are completely normal, you will receive your results only by: MyChart Message (if you have MyChart)  OR A paper copy in the mail If you have any lab test that is abnormal or we need to change your treatment, we will call you to review the results.   Testing/Procedures:  None    Follow-Up: At [facility], you and your health needs are our priority.  As part of our continuing mission to provide you with exceptional heart care, we have created designated Provider Care Teams.  These Care Teams include your primary Cardiologist (physician) and Advanced Practice Providers (APPs -  Physician Assistants and Nurse Practitioners) who all work together to provide you with the care you need, when you need it.  We recommend signing up for the patient portal called "MyChart".  Sign up information is provided on this After Visit Summary.  MyChart is used to connect with patients for Virtual Visits (Telemedicine).  Patients are able to view lab/test results, encounter notes, upcoming appointments, etc.  Non-urgent messages can be sent to your provider as well.   To learn more about what you can do with MyChart, go to [email or web address].    Your next appointment:   6 month(s)  Provider:   Armanda Magic, MD     Other Instructions  Your physician wants you to follow-up in: 6 months with Dr. Mayford Knife.  You will receive a reminder letter in the mail two months in advance. If you don't receive a letter, please call our office to schedule the follow-up appointment.     Signed, Levi Aland, NP  12/27/2022  12:41 PM    [facility]

## 2022-12-27 ENCOUNTER — Encounter: Payer: Self-pay | Admitting: Nurse Practitioner

## 2022-12-27 ENCOUNTER — Ambulatory Visit: Payer: Medicare Other | Attending: Nurse Practitioner | Admitting: Nurse Practitioner

## 2022-12-27 VITALS — BP 128/68 | HR 77 | Ht 61.0 in | Wt 156.0 lb

## 2022-12-27 DIAGNOSIS — G20A1 Parkinson's disease without dyskinesia, without mention of fluctuations: Secondary | ICD-10-CM | POA: Diagnosis not present

## 2022-12-27 DIAGNOSIS — Z86711 Personal history of pulmonary embolism: Secondary | ICD-10-CM | POA: Diagnosis not present

## 2022-12-27 DIAGNOSIS — D649 Anemia, unspecified: Secondary | ICD-10-CM

## 2022-12-27 DIAGNOSIS — I10 Essential (primary) hypertension: Secondary | ICD-10-CM | POA: Diagnosis not present

## 2022-12-27 DIAGNOSIS — R55 Syncope and collapse: Secondary | ICD-10-CM

## 2022-12-27 DIAGNOSIS — E785 Hyperlipidemia, unspecified: Secondary | ICD-10-CM | POA: Diagnosis not present

## 2022-12-27 DIAGNOSIS — I2510 Atherosclerotic heart disease of native coronary artery without angina pectoris: Secondary | ICD-10-CM

## 2022-12-27 DIAGNOSIS — G901 Familial dysautonomia [Riley-Day]: Secondary | ICD-10-CM | POA: Diagnosis not present

## 2022-12-27 DIAGNOSIS — I6523 Occlusion and stenosis of bilateral carotid arteries: Secondary | ICD-10-CM

## 2022-12-27 DIAGNOSIS — I480 Paroxysmal atrial fibrillation: Secondary | ICD-10-CM

## 2022-12-27 DIAGNOSIS — Z7901 Long term (current) use of anticoagulants: Secondary | ICD-10-CM

## 2022-12-27 DIAGNOSIS — I119 Hypertensive heart disease without heart failure: Secondary | ICD-10-CM | POA: Diagnosis not present

## 2022-12-27 DIAGNOSIS — E119 Type 2 diabetes mellitus without complications: Secondary | ICD-10-CM | POA: Diagnosis not present

## 2022-12-27 MED ORDER — ROSUVASTATIN CALCIUM 10 MG PO TABS: 10.0000 mg | ORAL_TABLET | Freq: Every evening | ORAL | 3 refills | Status: DC

## 2022-12-27 NOTE — Patient Instructions (Signed)
Medication Instructions:   RESTART Rosuvastatin one (1) tablet by mouth ( 10 mg) daily.   *If you need a refill on your cardiac medications before your next appointment, please call your pharmacy*   Lab Work:  None ordered.  If you have labs (blood work) drawn today and your tests are completely normal, you will receive your results only by: MyChart Message (if you have MyChart) OR A paper copy in the mail If you have any lab test that is abnormal or we need to change your treatment, we will call you to review the results.   Testing/Procedures:  None    Follow-Up: At [facility], you and your health needs are our priority.  As part of our continuing mission to provide you with exceptional heart care, we have created designated Provider Care Teams.  These Care Teams include your primary Cardiologist (physician) and Advanced Practice Providers (APPs -  Physician Assistants and Nurse Practitioners) who all work together to provide you with the care you need, when you need it.  We recommend signing up for the patient portal called "MyChart".  Sign up information is provided on this After Visit Summary.  MyChart is used to connect with patients for Virtual Visits (Telemedicine).  Patients are able to view lab/test results, encounter notes, upcoming appointments, etc.  Non-urgent messages can be sent to your provider as well.   To learn more about what you can do with MyChart, go to [email or web address].    Your next appointment:   6 month(s)  Provider:   Armanda Magic, MD     Other Instructions  Your physician wants you to follow-up in: 6 months with Dr. Mayford Knife.  You will receive a reminder letter in the mail two months in advance. If you don't receive a letter, please call our office to schedule the follow-up appointment.

## 2022-12-28 ENCOUNTER — Telehealth: Payer: Self-pay

## 2022-12-28 NOTE — Telephone Encounter (Signed)
-----   Message from Quintella Reichert, MD sent at 12/20/2022  3:08 PM EDT ----- Heart monitor showed normal rhythm except for 1 episode of a 5 beat run of extra heartbeats from the bottom of the heart.  Please find out if she had any symptoms while she was wearing heart monitor.  If she is asymptomatic then continue metoprolol.  Recent potassium was normal at 4.8 and mag was 2.2.  And normal TSH

## 2022-12-28 NOTE — Telephone Encounter (Signed)
Called to discuss results of heart monitor, no answer, left detailed message per DPR.

## 2022-12-29 ENCOUNTER — Telehealth: Payer: Self-pay

## 2022-12-29 DIAGNOSIS — I480 Paroxysmal atrial fibrillation: Secondary | ICD-10-CM | POA: Diagnosis not present

## 2022-12-29 DIAGNOSIS — I119 Hypertensive heart disease without heart failure: Secondary | ICD-10-CM | POA: Diagnosis not present

## 2022-12-29 DIAGNOSIS — G901 Familial dysautonomia [Riley-Day]: Secondary | ICD-10-CM | POA: Diagnosis not present

## 2022-12-29 DIAGNOSIS — G20A1 Parkinson's disease without dyskinesia, without mention of fluctuations: Secondary | ICD-10-CM | POA: Diagnosis not present

## 2022-12-29 DIAGNOSIS — E119 Type 2 diabetes mellitus without complications: Secondary | ICD-10-CM | POA: Diagnosis not present

## 2022-12-29 NOTE — Telephone Encounter (Signed)
A Prior Authorization was initiated for this patients LIDOCAINE PATCHES through CoverMyMeds.   Key: [id number]

## 2022-12-30 ENCOUNTER — Telehealth: Payer: Self-pay | Admitting: Family Medicine

## 2022-12-30 NOTE — Telephone Encounter (Signed)
Prior Auth for patients medication LIDOCAINE 5% PATCHES denied by [employer] via CoverMyMeds.   Reason:   LIDOCAINE 4% PATCHES ARE AVAILABLE OTC  CoverMyMeds Key: [id number]

## 2022-12-30 NOTE — Telephone Encounter (Signed)
Contacted Anna Bernard to schedule their annual wellness visit. Appointment made for 01/05/2023.  Thank you,  [facility] Support [facility] Direct dial  [phone]

## 2023-01-02 ENCOUNTER — Other Ambulatory Visit: Payer: Self-pay | Admitting: Family Medicine

## 2023-01-02 DIAGNOSIS — M2041 Other hammer toe(s) (acquired), right foot: Secondary | ICD-10-CM | POA: Diagnosis not present

## 2023-01-02 DIAGNOSIS — M24574 Contracture, right foot: Secondary | ICD-10-CM | POA: Diagnosis not present

## 2023-01-02 DIAGNOSIS — M205X1 Other deformities of toe(s) (acquired), right foot: Secondary | ICD-10-CM | POA: Diagnosis not present

## 2023-01-02 MED ORDER — LIDOCAINE 4 % EX PTCH: 1.0000 | MEDICATED_PATCH | CUTANEOUS | 2 refills | Status: DC

## 2023-01-02 NOTE — Progress Notes (Signed)
Lidocaine 4% OTC sent to pharmacy.

## 2023-01-02 NOTE — Telephone Encounter (Signed)
4% OTC sent to pharmacy.

## 2023-01-04 NOTE — Progress Notes (Unsigned)
I connected with  Anna Bernard, and husband Anna Bernard on 01/05/2023  by a audio enabled telemedicine application and verified that I am speaking with the correct person using two identifiers.  Patient Location: Home  Provider Location: Home Office  I discussed the limitations of evaluation and management by telemedicine. The patient expressed understanding and agreed to proceed.   Subjective:   Anna Bernard is a 72 y.o. female who presents for Medicare Annual (Subsequent) preventive examination.  Review of Systems    Per HPI unless specifically indicated below.  Cardiac Risk Factors include: advanced age (>39men, >72 women);female gender, Essential Hypertension, and Pure hypercholesterolemia.           Objective:       12/27/2022    9:26 AM 12/27/2022    9:06 AM 11/29/2022   11:17 AM  Vitals with BMI  Height     Weight  156 lbs 156 lbs  BMI  29.49 29.49  Systolic 128 144 098  Diastolic 68 74 58  Pulse  77 74    Today's Vitals   01/05/23 0939  PainSc: 2    There is no height or weight on file to calculate BMI.     11/29/2022   11:09 AM 11/17/2022    6:01 PM 11/14/2022   11:55 AM 08/16/2022   10:44 AM 06/03/2022   11:30 AM 05/24/2022    4:00 PM 05/19/2022    9:16 AM  Advanced Directives  Does Patient Have a Medical Advance Directive? No No No Yes No No No  Type of Advance Directive    Living will     Does patient want to make changes to medical advance directive?      Yes (Inpatient - patient requests chaplain consult to change a medical advance directive)   Would patient like information on creating a medical advance directive? No - Patient declined No - Patient declined No - Patient declined  No - Patient declined Yes (Inpatient - patient requests chaplain consult to create a medical advance directive)     Current Medications (verified) Outpatient Encounter Medications as of 01/05/2023  Medication Sig   Carbidopa-Levodopa ER (SINEMET CR) 25-100 MG  tablet controlled release NEW PRESCRIPTION REQUEST: TAKE THREE TABLETS BY MOUTH AT 7:00AM and TAKE TWO TABLETS BY MOUTH AT 11:00AM and TAKE TWO TABLETS BY MOUTH AT 4:00PM (Patient taking differently: NEW PRESCRIPTION REQUEST: TAKE THREE TABLETS BY MOUTH AT 7:00AM and TAKE TWO TABLETS BY MOUTH AT 11:00AM and TAKE TWO TABLETS BY MOUTH AT 4:00PM. 3 tablets at 7 pm.)   ELIQUIS 5 MG TABS tablet NEW PRESCRIPTION REQUEST: TAKE ONE TABLET BY MOUTH TWICE DAILY   levothyroxine (SYNTHROID) 100 MCG tablet NEW PRESCRIPTION REQUEST: TAKE ONE TABLET BY MOUTH DAILY   metoprolol tartrate (LOPRESSOR) 25 MG tablet NEW PRESCRIPTION REQUEST: TAKE 1/2 TABLET BY MOUTH TWICE DAILY   Multiple Vitamin (MULTIVITAMIN WITH MINERALS) TABS Take 1 tablet by mouth daily with breakfast.   oxyCODONE (OXY IR/ROXICODONE) 5 MG immediate release tablet Take 5 mg by mouth every 6 (six) hours as needed.   pantoprazole (PROTONIX) 40 MG tablet NEW PRESCRIPTION REQUEST: TAKE ONE TABLET BY MOUTH TWICE DAILY   diclofenac Sodium (VOLTAREN) 1 % GEL NEW PRESCRIPTION REQUEST: APPLY TWO GRAM TOPICALLY TO FEET TWICE DAILY (Patient not taking: Reported on 01/05/2023)   ferrous sulfate 325 (65 FE) MG tablet TAKE 1 TABLET BY MOUTH EVERY DAY WITH BREAKFAST (Patient not taking: Reported on 01/05/2023)   lidocaine 4 %  Place 1 patch onto the skin daily. (Patient not taking: Reported on 01/05/2023)   melatonin 3 MG TABS tablet Take 3 mg by mouth at bedtime as needed (For sleep). (Patient not taking: Reported on 01/05/2023)   polyethylene glycol (MIRALAX / GLYCOLAX) 17 g packet Take 17 g by mouth 2 (two) times daily. (Patient not taking: Reported on 01/05/2023)   rosuvastatin (CRESTOR) 10 MG tablet Take 1 tablet (10 mg total) by mouth every evening. (Patient not taking: Reported on 01/05/2023)   senna-docusate (SENOKOT-S) 8.6-50 MG tablet Take 1 tablet by mouth at bedtime. Hold if diarrhea (Patient not taking: Reported on 01/05/2023)   TYLENOL 8 HOUR ARTHRITIS PAIN 650  MG CR tablet Take 1,300 mg by mouth every 8 (eight) hours as needed for pain. (Patient not taking: Reported on 01/05/2023)   [DISCONTINUED] HYDROcodone-acetaminophen (NORCO/VICODIN) 5-325 MG tablet Take 1 tablet by mouth every 6 (six) hours as needed for moderate pain or severe pain. (Patient not taking: Reported on 01/05/2023)   No facility-administered encounter medications on file as of 01/05/2023.    Allergies (verified) Patient has no known allergies.   History: Past Medical History:  Diagnosis Date   Anemia, unspecified    Carotid artery disease    Hyperlipidemia, unspecified    Hypertension    Hypothyroidism, unspecified    Mild CAD    PAF (paroxysmal atrial fibrillation)    Pre-diabetes    Pulmonary emboli    Stenosis of right subclavian artery    possible by duplex 2023   Vitamin B12 deficiency    Past Surgical History:  Procedure Laterality Date   ABDOMINAL HYSTERECTOMY     ENTEROSCOPY N/A 11/18/2022   Procedure: ENTEROSCOPY;  Surgeon: Jeani Hawking, MD;  Location: [facility] ENDOSCOPY;  Service: Gastroenterology;  Laterality: N/A;   GIVENS CAPSULE STUDY N/A 04/16/2018   Procedure: GIVENS CAPSULE STUDY;  Surgeon: Jeani Hawking, MD;  Location: [facility] ENDOSCOPY;  Service: Endoscopy;  Laterality: N/A;   HOT HEMOSTASIS N/A 11/18/2022   Procedure: HOT HEMOSTASIS (ARGON PLASMA COAGULATION/BICAP);  Surgeon: Jeani Hawking, MD;  Location: [facility] ENDOSCOPY;  Service: Gastroenterology;  Laterality: N/A;   PARS PLANA VITRECTOMY Right 08/16/2020   Procedure: 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;  Surgeon: Carmela Rima, MD;  Location: [facility] OR;  Service: Ophthalmology;  Laterality: Right;   PARS PLANA VITRECTOMY Right 09/29/2020   Procedure: PARS PLANA VITRECTOMY WITH 25 GAUGE - ENDOCAUTRY;  Surgeon: Carmela Rima, MD;  Location: [facility] OR;  Service: Ophthalmology;  Laterality: Right;   PHOTOCOAGULATION WITH LASER Right 08/16/2020   Procedure: PHOTOCOAGULATION WITH LASER;   Surgeon: Carmela Rima, MD;  Location: [facility] OR;  Service: Ophthalmology;  Laterality: Right;   PHOTOCOAGULATION WITH LASER Right 09/29/2020   Procedure: PHOTOCOAGULATION WITH LASER;  Surgeon: Carmela Rima, MD;  Location: [facility] OR;  Service: Ophthalmology;  Laterality: Right;   SHOULDER SURGERY     TONSILLECTOMY     TUBAL LIGATION     Family History  Problem Relation Age of Onset   Renal Disease Sister    Cancer Sister    Social History   Socioeconomic History   Marital status: Married    Spouse name: Not on file   Number of children: 1   Years of education: Not on file   Highest education level: Not on file  Occupational History   Occupation: retired  Tobacco Use   Smoking status: Former    Passive exposure: Past   Smokeless tobacco: Never   Tobacco comments:  quit 30+ years ago  Vaping Use   Vaping Use: Never used  Substance and Sexual Activity   Alcohol use: No    Alcohol/week: 0.0 standard drinks of alcohol   Drug use: No   Sexual activity: Not Currently  Other Topics Concern   Not on file  Social History Narrative   Right handed   Lives with husband in a one story home.   Caffeine once in a while   Social Determinants of Health   Financial Resource Strain: Low Risk  (01/05/2023)   Overall Financial Resource Strain (CARDIA)    Difficulty of Paying Living Expenses: Not hard at all  Food Insecurity: No Food Insecurity (01/05/2023)   Hunger Vital Sign    Worried About Running Out of Food in the Last Year: Never true    Ran Out of Food in the Last Year: Never true  Transportation Needs: No Transportation Needs (01/05/2023)   PRAPARE - Administrator, Civil Service (Medical): No    Lack of Transportation (Non-Medical): No  Physical Activity: Inactive (01/05/2023)   Exercise Vital Sign    Days of Exercise per Week: 0 days    Minutes of Exercise per Session: 0 min  Stress: No Stress Concern Present (01/05/2023)   [employer] of Occupational Health  - Occupational Stress Questionnaire    Feeling of Stress : Not at all  Social Connections: Moderately Isolated (01/05/2023)   Social Connection and Isolation Panel [NHANES]    Frequency of Communication with Friends and Family: Three times a week    Frequency of Social Gatherings with Friends and Family: More than three times a week    Attends Religious Services: Never    Diplomatic Services operational officer: Patient unable to answer    Attends Banker Meetings: Never    Marital Status: Married    Tobacco Counseling Counseling given: Not Answered Tobacco comments: quit 30+ years ago   Clinical Intake:  Pre-visit preparation completed: No  Pain : 0-10 Pain Score: 2  Pain Type: Chronic pain Pain Location: Knee Pain Orientation: Left Pain Descriptors / Indicators: Aching Pain Onset: In the past 7 days Pain Frequency: Intermittent     Nutritional Status: BMI of 19-24  Normal Nutritional Risks: Unintentional weight gain, None Diabetes: No  How often do you need to have someone help you when you read instructions, pamphlets, or other written materials from your doctor or pharmacy?: 1 - Never  Diabetic? no  Interpreter Needed?: No  Information entered by :: Laurel Dimmer, CMA   Activities of Daily Living    01/05/2023    9:39 AM 11/17/2022    6:01 PM  In your present state of health, do you have any difficulty performing the following activities:  Hearing? 0 0  Vision? 0 0  Difficulty concentrating or making decisions? 0 0  Walking or climbing stairs? 1 1  Dressing or bathing? 0 0  Comment  mostly independent, but sometimes husband help her  Doing errands, shopping? 1 0    Patient Care Team: Billey Co, MD as PCP - General (Family Medicine) Quintella Reichert, MD as PCP - Cardiology (Cardiology) Marcy Salvo, NP (Inactive) as Nurse Practitioner (Nurse Practitioner) Tat, Octaviano Batty, DO as Consulting Physician (Neurology)  Indicate any  recent Medical Services you may have received from other than [facility] providers in the past year (date may be approximate).     Assessment:   This is a routine wellness examination for [facility].  Hearing/Vision screen Denies any hearing issues. Denies any vision changes. Annual Eye Exam( The pt state she had an Eye Exam 2 months ago, but unsure the name of the practice and the provider.   Dietary issues and exercise activities discussed: Current Exercise Habits: The patient does not participate in regular exercise at present, Exercise limited by: neurologic condition(s)   Goals Addressed   None    Depression Screen    01/05/2023    9:38 AM 11/29/2022   11:21 AM 11/14/2022   11:55 AM 09/16/2022    3:47 PM 06/03/2022   11:27 AM 05/19/2022    9:16 AM 04/29/2022   11:31 AM  PHQ 2/9 Scores  PHQ - 2 Score 0 1 1 0 0 0 1  PHQ- 9 Score  0  2    Fall Risk    01/05/2023    9:38 AM 11/14/2022   11:55 AM 08/16/2022   10:44 AM 06/03/2022   11:29 AM 04/29/2022   11:32 AM  Fall Risk   Falls in the past year? 0 0 0 1 1  Number falls in past yr: 0 0 0 1 1  Injury with Fall? 0 0 0 1 1  Risk for fall due to : No Fall Risks      Follow up Falls evaluation completed  Falls evaluation completed  Falls evaluation completed    FALL RISK PREVENTION PERTAINING TO THE HOME:  Any stairs in or around the home? Yes  If so, are there any without handrails? Yes  Home free of loose throw rugs in walkways, pet beds, electrical cords, etc? Yes  Adequate lighting in your home to reduce risk of falls? Yes   ASSISTIVE DEVICES UTILIZED TO PREVENT FALLS:  Life alert? No  Use of a cane, walker or w/c? Yes  Grab bars in the bathroom? Yes  Shower chair or bench in shower? Yes  Elevated toilet seat or a handicapped toilet? Yes   TIMED UP AND GO:  Was the test performed? Unable to perform, virtual appointment   Cognitive Function:        01/05/2023    9:45 AM 01/23/2020   11:06 AM  6CIT Screen  What  Year? 4 points 0 points  What month? 3 points 0 points  What time? 0 points 0 points  Count back from 20 0 points 0 points  Months in reverse 4 points 0 points  Repeat phrase 8 points 0 points  Total Score 19 points 0 points    Immunizations Immunization History  Administered Date(s) Administered   Fluad Quad(high Dose 65+) 06/27/2019, 06/18/2020, 07/09/2021   Influenza,inj,Quad PF,6+ Mos 05/29/2018   PFIZER(Purple Top)SARS-COV-2 Vaccination 12/14/2019, 01/07/2020, 07/20/2020   Pfizer Covid-19 Vaccine Bivalent Booster 29yrs & up 06/29/2021   Pneumococcal Conjugate-13 06/27/2019   Pneumococcal Polysaccharide-23 01/31/2018    TDAP status: Due, Education has been provided regarding the importance of this vaccine. Advised may receive this vaccine at local pharmacy or Health Dept. Aware to provide a copy of the vaccination record if obtained from local pharmacy or Health Dept. Verbalized acceptance and understanding.  Flu Vaccine status: Up to date  Pneumococcal vaccine status: Up to date  Covid-19 vaccine status: Information provided on how to obtain vaccines.   Qualifies for Shingles Vaccine? Yes   Zostavax completed No   Shingrix Completed?: No.    Education has been provided regarding the importance of this vaccine. Patient has been advised to call insurance company to determine out of  pocket expense if they have not yet received this vaccine. Advised may also receive vaccine at local pharmacy or Health Dept. Verbalized acceptance and understanding.  Screening Tests Health Maintenance  Topic Date Due   DTaP/Tdap/Td (1 - Tdap) Never done   Zoster Vaccines- Shingrix (1 of 2) Never done   Diabetic kidney evaluation - Urine ACR  04/21/2014   OPHTHALMOLOGY EXAM  12/03/2020   MAMMOGRAM  08/08/2021   COVID-19 Vaccine (5 - 2023-24 season) 05/20/2022   FOOT EXAM  06/25/2022   INFLUENZA VACCINE  04/20/2023   HEMOGLOBIN A1C  05/15/2023   Diabetic kidney evaluation - eGFR measurement   11/29/2023   Medicare Annual Wellness (AWV)  01/05/2024   COLONOSCOPY (Pts 45-66yrs Insurance coverage will need to be confirmed)  03/21/2028   Pneumonia Vaccine 70+ Years old  Completed   DEXA SCAN  Completed   Hepatitis C Screening  Completed   HPV VACCINES  Aged Out    Health Maintenance  Health Maintenance Due  Topic Date Due   DTaP/Tdap/Td (1 - Tdap) Never done   Zoster Vaccines- Shingrix (1 of 2) Never done   Diabetic kidney evaluation - Urine ACR  04/21/2014   OPHTHALMOLOGY EXAM  12/03/2020   MAMMOGRAM  08/08/2021   COVID-19 Vaccine (5 - 2023-24 season) 05/20/2022   FOOT EXAM  06/25/2022    Colorectal cancer screening: Type of screening: Colonoscopy. Completed 03/21/2018. Repeat every 10 years  Mammogram status: Ordered and scheduled on 01/31/2023. Pt provided with contact info and advised to call to schedule appt.   DEXA Scan: 11/02/2018  Lung Cancer Screening: (Low Dose CT Chest recommended if Age 55-80 years, 30 pack-year currently smoking OR have quit w/in 15years.) does not qualify.   Lung Cancer Screening Referral: not applicable   Additional Screening:  Hepatitis C Screening: does qualify; Completed 09/28/2021  Vision Screening: Recommended annual ophthalmology exams for early detection of glaucoma and other disorders of the eye. Is the patient up to date with their annual eye exam?  Yes  Who is the provider or what is the name of the office in which the patient attends annual eye exams?  If pt is not established with a provider, would they like to be referred to a provider to establish care? No .   Dental Screening: Recommended annual dental exams for proper oral hygiene  Community Resource Referral / Chronic Care Management: CRR required this visit?  No   CCM required this visit?  No      Plan:     I have personally reviewed and noted the following in the patient's chart:   Medical and social history Use of alcohol, tobacco or illicit drugs   Current medications and supplements including opioid prescriptions. Patient is not currently taking opioid prescriptions. Functional ability and status Nutritional status Physical activity Advanced directives List of other physicians Hospitalizations, surgeries, and ER visits in previous 12 months Vitals Screenings to include cognitive, depression, and falls Referrals and appointments  In addition, I have reviewed and discussed with patient certain preventive protocols, quality metrics, and best practice recommendations. A written personalized care plan for preventive services as well as general preventive health recommendations were provided to patient.     Ms. Taussig , Thank you for taking time to come for your Medicare Wellness Visit. I appreciate your ongoing commitment to your health goals. Please review the following plan we discussed and let me know if I can assist you in the future.   These are the goals  we discussed:  Goals      Weight (lb) < 200 lb (90.7 kg)     Increase activity         This is a list of the screening recommended for you and due dates:  Health Maintenance  Topic Date Due   DTaP/Tdap/Td vaccine (1 - Tdap) Never done   Zoster (Shingles) Vaccine (1 of 2) Never done   Yearly kidney health urinalysis for diabetes  04/21/2014   Eye exam for diabetics  12/03/2020   Mammogram  08/08/2021   COVID-19 Vaccine (5 - 2023-24 season) 05/20/2022   Complete foot exam   06/25/2022   Flu Shot  04/20/2023   Hemoglobin A1C  05/15/2023   Yearly kidney function blood test for diabetes  11/29/2023   Medicare Annual Wellness Visit  01/05/2024   Colon Cancer Screening  03/21/2028   Pneumonia Vaccine  Completed   DEXA scan (bone density measurement)  Completed   Hepatitis C Screening: USPSTF Recommendation to screen - Ages 51-79 yo.  Completed   HPV Vaccine  Aged [address] [place]   01/05/2023  Nurse Notes: Approximately 30 minute Non-Face -To-Face  Medicare Wellness Visit

## 2023-01-04 NOTE — Patient Instructions (Signed)

## 2023-01-05 ENCOUNTER — Ambulatory Visit (INDEPENDENT_AMBULATORY_CARE_PROVIDER_SITE_OTHER): Payer: Medicare Other

## 2023-01-05 DIAGNOSIS — Z0000 Encounter for general adult medical examination without abnormal findings: Secondary | ICD-10-CM

## 2023-01-09 DIAGNOSIS — M205X1 Other deformities of toe(s) (acquired), right foot: Secondary | ICD-10-CM | POA: Diagnosis not present

## 2023-01-09 DIAGNOSIS — M19071 Primary osteoarthritis, right ankle and foot: Secondary | ICD-10-CM | POA: Diagnosis not present

## 2023-01-12 ENCOUNTER — Inpatient Hospital Stay (HOSPITAL_COMMUNITY)
Admission: EM | Admit: 2023-01-12 | Discharge: 2023-01-17 | DRG: 378 | Disposition: A | Discharge status: Home / Self Care | Payer: Medicare Other | Attending: Family Medicine | Admitting: Family Medicine

## 2023-01-12 ENCOUNTER — Other Ambulatory Visit: Payer: Self-pay

## 2023-01-12 ENCOUNTER — Encounter (HOSPITAL_COMMUNITY): Payer: Self-pay

## 2023-01-12 ENCOUNTER — Emergency Department (HOSPITAL_COMMUNITY): Payer: Medicare Other

## 2023-01-12 DIAGNOSIS — I2510 Atherosclerotic heart disease of native coronary artery without angina pectoris: Secondary | ICD-10-CM | POA: Diagnosis present

## 2023-01-12 DIAGNOSIS — R195 Other fecal abnormalities: Secondary | ICD-10-CM

## 2023-01-12 DIAGNOSIS — Z90710 Acquired absence of both cervix and uterus: Secondary | ICD-10-CM

## 2023-01-12 DIAGNOSIS — I951 Orthostatic hypotension: Secondary | ICD-10-CM | POA: Diagnosis not present

## 2023-01-12 DIAGNOSIS — D120 Benign neoplasm of cecum: Secondary | ICD-10-CM | POA: Diagnosis present

## 2023-01-12 DIAGNOSIS — Z743 Need for continuous supervision: Secondary | ICD-10-CM | POA: Diagnosis not present

## 2023-01-12 DIAGNOSIS — E079 Disorder of thyroid, unspecified: Secondary | ICD-10-CM | POA: Diagnosis not present

## 2023-01-12 DIAGNOSIS — K921 Melena: Principal | ICD-10-CM | POA: Diagnosis present

## 2023-01-12 DIAGNOSIS — K648 Other hemorrhoids: Secondary | ICD-10-CM | POA: Diagnosis not present

## 2023-01-12 DIAGNOSIS — W19XXXA Unspecified fall, initial encounter: Secondary | ICD-10-CM | POA: Diagnosis not present

## 2023-01-12 DIAGNOSIS — G20A1 Parkinson's disease without dyskinesia, without mention of fluctuations: Secondary | ICD-10-CM | POA: Diagnosis present

## 2023-01-12 DIAGNOSIS — E861 Hypovolemia: Secondary | ICD-10-CM | POA: Diagnosis not present

## 2023-01-12 DIAGNOSIS — R6889 Other general symptoms and signs: Secondary | ICD-10-CM | POA: Diagnosis not present

## 2023-01-12 DIAGNOSIS — R296 Repeated falls: Secondary | ICD-10-CM | POA: Diagnosis present

## 2023-01-12 DIAGNOSIS — E039 Hypothyroidism, unspecified: Secondary | ICD-10-CM | POA: Diagnosis present

## 2023-01-12 DIAGNOSIS — Z1612 Extended spectrum beta lactamase (ESBL) resistance: Secondary | ICD-10-CM | POA: Diagnosis present

## 2023-01-12 DIAGNOSIS — E119 Type 2 diabetes mellitus without complications: Secondary | ICD-10-CM | POA: Diagnosis not present

## 2023-01-12 DIAGNOSIS — I10 Essential (primary) hypertension: Secondary | ICD-10-CM | POA: Diagnosis not present

## 2023-01-12 DIAGNOSIS — Z87891 Personal history of nicotine dependence: Secondary | ICD-10-CM

## 2023-01-12 DIAGNOSIS — E1151 Type 2 diabetes mellitus with diabetic peripheral angiopathy without gangrene: Secondary | ICD-10-CM | POA: Diagnosis not present

## 2023-01-12 DIAGNOSIS — K2950 Unspecified chronic gastritis without bleeding: Secondary | ICD-10-CM | POA: Diagnosis not present

## 2023-01-12 DIAGNOSIS — E785 Hyperlipidemia, unspecified: Secondary | ICD-10-CM | POA: Diagnosis not present

## 2023-01-12 DIAGNOSIS — K5730 Diverticulosis of large intestine without perforation or abscess without bleeding: Secondary | ICD-10-CM | POA: Diagnosis not present

## 2023-01-12 DIAGNOSIS — Z9189 Other specified personal risk factors, not elsewhere classified: Secondary | ICD-10-CM | POA: Diagnosis not present

## 2023-01-12 DIAGNOSIS — R54 Age-related physical debility: Secondary | ICD-10-CM | POA: Diagnosis present

## 2023-01-12 DIAGNOSIS — Z8619 Personal history of other infectious and parasitic diseases: Secondary | ICD-10-CM | POA: Diagnosis present

## 2023-01-12 DIAGNOSIS — Z7901 Long term (current) use of anticoagulants: Secondary | ICD-10-CM

## 2023-01-12 DIAGNOSIS — M25561 Pain in right knee: Secondary | ICD-10-CM | POA: Diagnosis not present

## 2023-01-12 DIAGNOSIS — K219 Gastro-esophageal reflux disease without esophagitis: Secondary | ICD-10-CM | POA: Diagnosis present

## 2023-01-12 DIAGNOSIS — I480 Paroxysmal atrial fibrillation: Secondary | ICD-10-CM | POA: Diagnosis present

## 2023-01-12 DIAGNOSIS — I2782 Chronic pulmonary embolism: Secondary | ICD-10-CM | POA: Diagnosis not present

## 2023-01-12 DIAGNOSIS — K5909 Other constipation: Secondary | ICD-10-CM | POA: Diagnosis present

## 2023-01-12 DIAGNOSIS — Z79899 Other long term (current) drug therapy: Secondary | ICD-10-CM

## 2023-01-12 DIAGNOSIS — Z86711 Personal history of pulmonary embolism: Secondary | ICD-10-CM

## 2023-01-12 DIAGNOSIS — D509 Iron deficiency anemia, unspecified: Secondary | ICD-10-CM | POA: Diagnosis not present

## 2023-01-12 DIAGNOSIS — I252 Old myocardial infarction: Secondary | ICD-10-CM | POA: Diagnosis not present

## 2023-01-12 DIAGNOSIS — Z79890 Hormone replacement therapy (postmenopausal): Secondary | ICD-10-CM

## 2023-01-12 DIAGNOSIS — B9620 Unspecified Escherichia coli [E. coli] as the cause of diseases classified elsewhere: Secondary | ICD-10-CM | POA: Diagnosis present

## 2023-01-12 DIAGNOSIS — I4891 Unspecified atrial fibrillation: Secondary | ICD-10-CM | POA: Diagnosis not present

## 2023-01-12 DIAGNOSIS — Z043 Encounter for examination and observation following other accident: Secondary | ICD-10-CM | POA: Diagnosis not present

## 2023-01-12 DIAGNOSIS — K3189 Other diseases of stomach and duodenum: Secondary | ICD-10-CM | POA: Diagnosis present

## 2023-01-12 DIAGNOSIS — K922 Gastrointestinal hemorrhage, unspecified: Secondary | ICD-10-CM | POA: Diagnosis not present

## 2023-01-12 DIAGNOSIS — Z87440 Personal history of urinary (tract) infections: Secondary | ICD-10-CM

## 2023-01-12 DIAGNOSIS — I2699 Other pulmonary embolism without acute cor pulmonale: Secondary | ICD-10-CM | POA: Diagnosis present

## 2023-01-12 DIAGNOSIS — D500 Iron deficiency anemia secondary to blood loss (chronic): Secondary | ICD-10-CM | POA: Diagnosis not present

## 2023-01-12 DIAGNOSIS — D126 Benign neoplasm of colon, unspecified: Secondary | ICD-10-CM | POA: Diagnosis not present

## 2023-01-12 DIAGNOSIS — D122 Benign neoplasm of ascending colon: Secondary | ICD-10-CM

## 2023-01-12 DIAGNOSIS — K31A11 Gastric intestinal metaplasia without dysplasia, involving the antrum: Secondary | ICD-10-CM | POA: Diagnosis not present

## 2023-01-12 DIAGNOSIS — R55 Syncope and collapse: Secondary | ICD-10-CM | POA: Diagnosis present

## 2023-01-12 DIAGNOSIS — K5731 Diverticulosis of large intestine without perforation or abscess with bleeding: Secondary | ICD-10-CM | POA: Diagnosis not present

## 2023-01-12 DIAGNOSIS — Z9851 Tubal ligation status: Secondary | ICD-10-CM

## 2023-01-12 DIAGNOSIS — D62 Acute posthemorrhagic anemia: Secondary | ICD-10-CM | POA: Diagnosis present

## 2023-01-12 DIAGNOSIS — R404 Transient alteration of awareness: Secondary | ICD-10-CM | POA: Diagnosis not present

## 2023-01-12 DIAGNOSIS — K31811 Angiodysplasia of stomach and duodenum with bleeding: Secondary | ICD-10-CM | POA: Diagnosis not present

## 2023-01-12 LAB — COMPREHENSIVE METABOLIC PANEL
ALT: 5 U/L (ref 0–44)
AST: 13 U/L — ABNORMAL LOW (ref 15–41)
Albumin: 2.8 g/dL — ABNORMAL LOW (ref 3.5–5.0)
Alkaline Phosphatase: 54 U/L (ref 38–126)
Anion gap: 7 (ref 5–15)
BUN: 12 mg/dL (ref 8–23)
CO2: 25 mmol/L (ref 22–32)
Calcium: 7.8 mg/dL — ABNORMAL LOW (ref 8.9–10.3)
Chloride: 107 mmol/L (ref 98–111)
Creatinine, Ser: 0.79 mg/dL (ref 0.44–1.00)
GFR, Estimated: >60 mL/min (ref >60)
Glucose, Bld: 79 mg/dL (ref 70–99)
Potassium: 3.4 mmol/L — ABNORMAL LOW (ref 3.5–5.1)
Sodium: 139 mmol/L (ref 135–145)
Total Bilirubin: 0.7 mg/dL (ref 0.3–1.2)
Total Protein: 6.1 g/dL — ABNORMAL LOW (ref 6.5–8.1)

## 2023-01-12 LAB — URINALYSIS, ROUTINE W REFLEX MICROSCOPIC
Bilirubin Urine: NEGATIVE
Glucose, UA: NEGATIVE mg/dL
Ketones, ur: 5 mg/dL — AB
Nitrite: POSITIVE — AB
Protein, ur: NEGATIVE mg/dL
Specific Gravity, Urine: 1.01 (ref 1.005–1.030)
WBC, UA: >50 WBC/hpf (ref 0–5)
pH: 5 (ref 5.0–8.0)

## 2023-01-12 LAB — CBC WITH DIFFERENTIAL/PLATELET
Abs Immature Granulocytes: 0.02 10*3/uL (ref 0.00–0.07)
Basophils Absolute: 0 10*3/uL (ref 0.0–0.1)
Basophils Relative: 1 %
Eosinophils Absolute: 0.1 10*3/uL (ref 0.0–0.5)
Eosinophils Relative: 2 %
HCT: 25.1 % — ABNORMAL LOW (ref 36.0–46.0)
Hemoglobin: 8.1 g/dL — ABNORMAL LOW (ref 12.0–15.0)
Immature Granulocytes: 0 %
Lymphocytes Relative: 19 %
Lymphs Abs: 1.3 10*3/uL (ref 0.7–4.0)
MCH: 24.3 pg — ABNORMAL LOW (ref 26.0–34.0)
MCHC: 32.3 g/dL (ref 30.0–36.0)
MCV: 75.4 fL — ABNORMAL LOW (ref 80.0–100.0)
Monocytes Absolute: 0.5 10*3/uL (ref 0.1–1.0)
Monocytes Relative: 7 %
Neutro Abs: 4.9 10*3/uL (ref 1.7–7.7)
Neutrophils Relative %: 71 %
Platelets: 362 10*3/uL (ref 150–400)
RBC: 3.33 MIL/uL — ABNORMAL LOW (ref 3.87–5.11)
RDW: 17.3 % — ABNORMAL HIGH (ref 11.5–15.5)
WBC: 6.8 10*3/uL (ref 4.0–10.5)
nRBC: 0 % (ref 0.0–0.2)

## 2023-01-12 LAB — MAGNESIUM: Magnesium: 1.8 mg/dL (ref 1.7–2.4)

## 2023-01-12 LAB — TROPONIN I (HIGH SENSITIVITY)
Troponin I (High Sensitivity): 3 ng/L (ref <18)
Troponin I (High Sensitivity): 4 ng/L (ref <18)

## 2023-01-12 LAB — CBG MONITORING, ED: Glucose-Capillary: 78 mg/dL (ref 70–99)

## 2023-01-12 LAB — POC OCCULT BLOOD, ED: Fecal Occult Bld: POSITIVE — AB

## 2023-01-12 MED ORDER — ACETAMINOPHEN 500 MG PO TABS
1000.0000 mg | ORAL_TABLET | Freq: Once | ORAL | Status: AC
  Administered 2023-01-12: 1000 mg via ORAL
  Filled 2023-01-12: qty 2

## 2023-01-12 MED ORDER — ADULT MULTIVITAMIN W/MINERALS CH
1.0000 | ORAL_TABLET | Freq: Every day | ORAL | Status: DC
  Administered 2023-01-13 – 2023-01-17 (×3): 1 via ORAL
  Filled 2023-01-12 (×3): qty 1

## 2023-01-12 MED ORDER — FERROUS SULFATE 325 (65 FE) MG PO TABS
325.0000 mg | ORAL_TABLET | Freq: Every day | ORAL | Status: DC
  Administered 2023-01-13 – 2023-01-17 (×3): 325 mg via ORAL
  Filled 2023-01-12 (×3): qty 1

## 2023-01-12 MED ORDER — PANTOPRAZOLE SODIUM 40 MG IV SOLR
40.0000 mg | Freq: Two times a day (BID) | INTRAVENOUS | Status: DC
  Administered 2023-01-13 – 2023-01-17 (×9): 40 mg via INTRAVENOUS
  Filled 2023-01-12 (×9): qty 10

## 2023-01-12 MED ORDER — OXYCODONE HCL 5 MG PO TABS
5.0000 mg | ORAL_TABLET | ORAL | Status: DC | PRN
  Administered 2023-01-12 – 2023-01-17 (×7): 5 mg via ORAL
  Filled 2023-01-12 (×7): qty 1

## 2023-01-12 MED ORDER — LEVOTHYROXINE SODIUM 100 MCG PO TABS
100.0000 ug | ORAL_TABLET | Freq: Every day | ORAL | Status: DC
  Administered 2023-01-13 – 2023-01-17 (×4): 100 ug via ORAL
  Filled 2023-01-12 (×4): qty 1

## 2023-01-12 MED ORDER — ONDANSETRON HCL 4 MG PO TABS: 4.0000 mg | ORAL_TABLET | Freq: Four times a day (QID) | ORAL | Status: DC | PRN

## 2023-01-12 MED ORDER — ACETAMINOPHEN 325 MG PO TABS
650.0000 mg | ORAL_TABLET | Freq: Four times a day (QID) | ORAL | Status: DC | PRN
  Administered 2023-01-13 – 2023-01-17 (×3): 650 mg via ORAL
  Filled 2023-01-12 (×3): qty 2

## 2023-01-12 MED ORDER — SENNOSIDES-DOCUSATE SODIUM 8.6-50 MG PO TABS: 1.0000 | ORAL_TABLET | Freq: Every evening | ORAL | Status: DC | PRN

## 2023-01-12 MED ORDER — CARBIDOPA-LEVODOPA ER 25-100 MG PO TBCR
2.0000 | EXTENDED_RELEASE_TABLET | Freq: Two times a day (BID) | ORAL | Status: DC
  Administered 2023-01-13 – 2023-01-17 (×7): 2 via ORAL
  Filled 2023-01-12 (×10): qty 2

## 2023-01-12 MED ORDER — METOPROLOL TARTRATE 12.5 MG HALF TABLET
12.5000 mg | ORAL_TABLET | Freq: Two times a day (BID) | ORAL | Status: DC
  Administered 2023-01-13 – 2023-01-17 (×9): 12.5 mg via ORAL
  Filled 2023-01-12 (×9): qty 1

## 2023-01-12 MED ORDER — MELATONIN 3 MG PO TABS
3.0000 mg | ORAL_TABLET | Freq: Every evening | ORAL | Status: DC | PRN
  Administered 2023-01-13 – 2023-01-16 (×3): 3 mg via ORAL
  Filled 2023-01-12 (×3): qty 1

## 2023-01-12 MED ORDER — ONDANSETRON HCL 4 MG/2ML IJ SOLN: 4.0000 mg | Freq: Four times a day (QID) | INTRAMUSCULAR | Status: DC | PRN

## 2023-01-12 MED ORDER — CARBIDOPA-LEVODOPA ER 25-100 MG PO TBCR
3.0000 | EXTENDED_RELEASE_TABLET | Freq: Every day | ORAL | Status: DC
  Administered 2023-01-13 – 2023-01-17 (×4): 3 via ORAL
  Filled 2023-01-12 (×5): qty 3

## 2023-01-12 MED ORDER — ACETAMINOPHEN 650 MG RE SUPP: 650.0000 mg | Freq: Four times a day (QID) | RECTAL | Status: DC | PRN

## 2023-01-12 NOTE — ED Notes (Signed)
Report given to RN Lenard Galloway and [facility] called for transport

## 2023-01-12 NOTE — Assessment & Plan Note (Addendum)
Symptoms thought to be secondary to orthostasis related to volume depletion in the setting of symptomatic anemia as well as dysautonomia/Parkinson's.  Note the fairly extensive recent negative cards work up including syncope while on cardiac monitor in Feb, neg 2d echo in Feb, etc.  This is more extensively documented in her Cards office visit note earlier this month. Will keep pt on tele monitor for the moment.

## 2023-01-12 NOTE — Assessment & Plan Note (Signed)
Continue sinemet.

## 2023-01-12 NOTE — Assessment & Plan Note (Signed)
History of single small PE 01/2022 in setting of new onset A.Fib. See A.fib discussion above.

## 2023-01-12 NOTE — Assessment & Plan Note (Signed)
UA today once again with pyuria; however pt with no WBC, no SIRS, no reported dysuria.  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 Will hold off on ABx for the moment, given that I suspect todays UA once again represents colonization. Watch for development of symptoms (dysuria) or SIRS which might indicate and active infection.

## 2023-01-12 NOTE — H&P (Signed)
History and Physical    Patient: Anna Bernard:[id number] DOB: 11-11-1950 DOA: 01/12/2023 DOS: the patient was seen and examined on 01/12/2023 PCP: Billey Co, MD  Patient coming from: Home  Chief Complaint:  Chief Complaint  Patient presents with   Loss of Consciousness   HPI: SEASON ASTACIO is a 72 y.o. female with medical history significant of PD, PAF in 01/2022, Small PE in setting of new AF diagnosis.  Pt with Syncope episodes in Feb, put on event monitor, had further syncope episode on event monitor -> in to hospital, found to have GIB with ABLA.  Dr. Elnoria Howard performed EGD, hot hemostasis performed.  Eliquis resumed.  Ultimately Event monitor and 2d echo were unremarkable, syncope was felt to be due to anemia / volume depletion in setting of GIB + autonomic dysfunction / ortho stasis in setting of PD.  Pt now back in to ED with 2x syncope episodes at home in bathroom.  Family caught her while falling.  Didn't hit head, but did fall onto B knees.  Pt with pain in knees but otherwise no complaints.  She denies any CP, SOB, leg swelling, abd pain, N/V/D/C/, dysuria/hematuria. She also recently had R foot hammertoe repairs 1-5 with Dr. Theresia Bough on 01/09/23 and hard soled shoe with pins in place, patient reports no f/c, no increased swelling/pain/numbness/tingling in the foot. She denies asymmetric weakness.   Review of Systems: As mentioned in the history of present illness. All other systems reviewed and are negative. Past Medical History:  Diagnosis Date   Anemia, unspecified    Carotid artery disease (HCC)    Hyperlipidemia, unspecified    Hypertension    Hypothyroidism, unspecified    Mild CAD    PAF (paroxysmal atrial fibrillation) (HCC)    Pre-diabetes    Pulmonary emboli (HCC)    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 by duplex 2023   Vitamin B12 deficiency    Past Surgical History:  Procedure Laterality Date   ABDOMINAL HYSTERECTOMY      ENTEROSCOPY N/A 11/18/2022   Procedure: ENTEROSCOPY;  Surgeon: Jeani Hawking, MD;  Location: [facility] ENDOSCOPY;  Service: Gastroenterology;  Laterality: N/A;   GIVENS CAPSULE STUDY N/A 04/16/2018   Procedure: GIVENS CAPSULE STUDY;  Surgeon: Jeani Hawking, MD;  Location: [facility] ENDOSCOPY;  Service: Endoscopy;  Laterality: N/A;   HOT HEMOSTASIS N/A 11/18/2022   Procedure: HOT HEMOSTASIS (ARGON PLASMA COAGULATION/BICAP);  Surgeon: Jeani Hawking, MD;  Location: [facility] ENDOSCOPY;  Service: Gastroenterology;  Laterality: N/A;   PARS PLANA VITRECTOMY Right 08/16/2020   Procedure: 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;  Surgeon: Carmela Rima, MD;  Location: [facility] OR;  Service: Ophthalmology;  Laterality: Right;   PARS PLANA VITRECTOMY Right 09/29/2020   Procedure: PARS PLANA VITRECTOMY WITH 25 GAUGE - ENDOCAUTRY;  Surgeon: Carmela Rima, MD;  Location: [facility] OR;  Service: Ophthalmology;  Laterality: Right;   PHOTOCOAGULATION WITH LASER Right 08/16/2020   Procedure: PHOTOCOAGULATION WITH LASER;  Surgeon: Carmela Rima, MD;  Location: [facility] OR;  Service: Ophthalmology;  Laterality: Right;   PHOTOCOAGULATION WITH LASER Right 09/29/2020   Procedure: PHOTOCOAGULATION WITH LASER;  Surgeon: Carmela Rima, MD;  Location: [facility] OR;  Service: Ophthalmology;  Laterality: Right;   SHOULDER SURGERY     TONSILLECTOMY     TUBAL LIGATION     Social History:  reports that she has quit smoking. She has been exposed to tobacco smoke. She has never used smokeless tobacco. She reports that  she does not drink alcohol and does not use drugs.  No Known Allergies  Family History  Problem Relation Age of Onset   Renal Disease Sister    Cancer Sister     Prior to Admission medications   Medication Sig Start Date End Date Taking? Authorizing Provider  ELIQUIS 5 MG TABS tablet NEW PRESCRIPTION REQUEST: TAKE ONE TABLET BY MOUTH TWICE DAILY Patient taking differently: Take 5 mg by mouth 2 (two) times daily.  12/22/22  Yes Pray, Milus Mallick, MD  metoprolol tartrate (LOPRESSOR) 25 MG tablet NEW PRESCRIPTION REQUEST: TAKE 1/2 TABLET BY MOUTH TWICE DAILY Patient taking differently: Take 12.5 mg by mouth 2 (two) times daily. 12/22/22  Yes Pray, Milus Mallick, MD  pantoprazole (PROTONIX) 40 MG tablet NEW PRESCRIPTION REQUEST: TAKE ONE TABLET BY MOUTH TWICE DAILY Patient taking differently: Take 40 mg by mouth 2 (two) times daily before a meal. 12/22/22  Yes Pray, Milus Mallick, MD  Carbidopa-Levodopa ER (SINEMET CR) 25-100 MG tablet controlled release NEW PRESCRIPTION REQUEST: TAKE THREE TABLETS BY MOUTH AT 7:00AM and TAKE TWO TABLETS BY MOUTH AT 11:00AM and TAKE TWO TABLETS BY MOUTH AT 4:00PM Patient taking differently: NEW PRESCRIPTION REQUEST: TAKE THREE TABLETS BY MOUTH AT 7:00AM and TAKE TWO TABLETS BY MOUTH AT 11:00AM and TAKE TWO TABLETS BY MOUTH AT 4:00PM. 3 tablets at 7 pm. 12/22/22   Billey Co, MD  diclofenac Sodium (VOLTAREN) 1 % GEL NEW PRESCRIPTION REQUEST: APPLY TWO GRAM TOPICALLY TO FEET TWICE DAILY Patient not taking: Reported on 01/05/2023 12/22/22   Billey Co, MD  ferrous sulfate 325 (65 FE) MG tablet TAKE 1 TABLET BY MOUTH EVERY DAY WITH BREAKFAST 01/04/22   Billey Co, MD  levothyroxine (SYNTHROID) 100 MCG tablet NEW PRESCRIPTION REQUEST: TAKE ONE TABLET BY MOUTH DAILY 12/22/22   Billey Co, MD  lidocaine 4 % Place 1 patch onto the skin daily. 01/02/23   Billey Co, MD  melatonin 3 MG TABS tablet Take 3 mg by mouth at bedtime as needed (For sleep). 01/24/22   [provider]  Multiple Vitamin (MULTIVITAMIN WITH MINERALS) TABS Take 1 tablet by mouth daily with breakfast.    [provider]  oxyCODONE (OXY IR/ROXICODONE) 5 MG immediate release tablet Take 5 mg by mouth every 6 (six) hours as needed. 01/02/23   [provider]  polyethylene glycol (MIRALAX / GLYCOLAX) 17 g packet Take 17 g by mouth 2 (two) times daily. 11/20/22   Albertine Grates, MD  rosuvastatin  (CRESTOR) 10 MG tablet Take 1 tablet (10 mg total) by mouth every evening. 12/27/22 12/27/23  Swinyer, Zachary George, NP  senna-docusate (SENOKOT-S) 8.6-50 MG tablet Take 1 tablet by mouth at bedtime. Hold if diarrhea 11/20/22   Albertine Grates, MD  TYLENOL 8 HOUR ARTHRITIS PAIN 650 MG CR tablet Take 1,300 mg by mouth every 8 (eight) hours as needed for pain.    [provider]    Physical Exam: Vitals:   01/12/23 1537 01/12/23 1538 01/12/23 1541 01/12/23 1630  BP: (!) 161/70   (!) 162/84  Pulse: 71   72  Resp:   16   Temp:   97.8 F (36.6 C)   SpO2: 98%   99%  Weight:  77.1 kg    Height:  5' 1.5" (1.562 m)     Constitutional: NAD, calm, comfortable Respiratory: clear to auscultation bilaterally, no wheezing, no crackles. Normal respiratory effort. No accessory muscle use.  Cardiovascular: Regular rate and rhythm, no murmurs /  rubs / gallops. No extremity edema. 2+ pedal pulses. No carotid bruits.  Abdomen: no tenderness, no masses palpated. No hepatosplenomegaly. Bowel sounds positive.  Musculoskeletal: S/p hammer toe repair of R foot with hard soled shoe and pins still present in toes. No erythema/induration, no signficant TTP.  Neurologic: CN 2-12 grossly intact. Sensation intact, DTR normal. Strength 5/5 in all 4.  Psychiatric: Normal judgment and insight. Alert and oriented x 3. Normal mood.   Data Reviewed:    Hemoccult positive     Latest Ref Rng & Units 01/12/2023    3:37 PM 11/29/2022    4:49 PM 11/19/2022    4:10 AM  CBC  WBC 4.0 - 10.5 K/uL 6.8  7.8  9.5   Hemoglobin 12.0 - 15.0 g/dL 8.1  9.6  8.2   Hematocrit 36.0 - 46.0 % 25.1  30.5  25.6   Platelets 150 - 400 K/uL 362  348  375       Latest Ref Rng & Units 01/12/2023    3:37 PM 11/29/2022    4:49 PM 11/20/2022    3:58 AM  CMP  Glucose 70 - 99 mg/dL 79  83  72   BUN 8 - 23 mg/dL 12  15  16    Creatinine 0.44 - 1.00 mg/dL 2.95  6.21  3.08   Sodium 135 - 145 mmol/L 139  142  141   Potassium 3.5 - 5.1 mmol/L 3.4  4.8  3.7    Chloride 98 - 111 mmol/L 107  105  108   CO2 22 - 32 mmol/L 25  24  26    Calcium 8.9 - 10.3 mg/dL 7.8  8.9  8.6   Total Protein 6.5 - 8.1 g/dL 6.1     Total Bilirubin 0.3 - 1.2 mg/dL 0.7     Alkaline Phos 38 - 126 U/L 54     AST 15 - 41 U/L 13     ALT 0 - 44 U/L 5      Trop neg x2  Urinalysis    Component Value Date/Time   COLORURINE YELLOW 01/12/2023 1627   APPEARANCEUR CLOUDY (A) 01/12/2023 1627   APPEARANCEUR Cloudy (A) 09/02/2019 1043   LABSPEC 1.010 01/12/2023 1627   PHURINE 5.0 01/12/2023 1627   GLUCOSEU NEGATIVE 01/12/2023 1627   HGBUR SMALL (A) 01/12/2023 1627   BILIRUBINUR NEGATIVE 01/12/2023 1627   BILIRUBINUR negative 09/16/2022 1635   BILIRUBINUR Negative 09/02/2019 1043   KETONESUR 5 (A) 01/12/2023 1627   PROTEINUR NEGATIVE 01/12/2023 1627   UROBILINOGEN 0.2 09/16/2022 1635   NITRITE POSITIVE (A) 01/12/2023 1627   LEUKOCYTESUR LARGE (A) 01/12/2023 1627   X rays of B knees = no acute fractures, does have moderate arthritis.  EKG = Sinus rhythm  Assessment and Plan: * GI bleed Pt with recurrent GIB in setting of ongoing Eliquis use: hemoccult positive today and HGB 8.1 down from 9.6 on discharge. Dr Elnoria Howard will be doing repeat EGD tomorrow Clear liquids until MN NPO after MN Tele monitor Type and screen Hold eliquis - see A.Fib discussion below Repeat CBC in AM.  Syncope Symptoms thought to be secondary to orthostasis related to volume depletion in the setting of symptomatic anemia as well as dysautonomia/Parkinson's.  Note the fairly extensive recent negative cards work up including syncope while on cardiac monitor in Feb, neg 2d echo in Feb, etc.  This is more extensively documented in her Cards office visit note earlier this month. Will keep pt on tele monitor for the  moment.  Pulmonary embolus (HCC) History of single small PE 01/2022 in setting of new onset A.Fib. See A.fib discussion above.  Paroxysmal atrial fibrillation (HCC) Cont  Lopressor Holding eliquis in setting of GIB with ABLA Between 2 GIBs in 2 months + multiple syncope episodes + quite high fall risk with Parkinson's disease and ambulatory difficulty (arrives to cards office visit earlier this month in wheelchair) + normal renal function = I question if she isnt a candidate for a Watchman's procedure? so I'm admitting patient [facility] to [facility] for cardiology to consult and consider that. The one area that may still be a complicating factor / question in all of this is that she did have a small PE at time her A.Fib was initially diagnosed 01/2022; however, no DVT was ever noted or reported, and the way the discharge summary from that admit reads seems to suggest a possible PFO as the source.  No other h/o PE except that one time. Did discuss my thought process with the family regarding above as well. I have sent a message to P. Johna Sheriff for routine cards evaluation / consult tomorrow during day hours.  History of ESBL E. coli infection UA today once again with pyuria; however pt with no WBC, no SIRS, no reported dysuria.  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 Will hold off on ABx for the moment, given that I suspect todays UA once again represents colonization. Watch for development of symptoms (dysuria) or SIRS which might indicate and active infection.  Thyroid disease Cont synthroid.  Parkinson disease Continue sinemet  Essential hypertension Cont Metoprolol      Advance Care Planning:   Code Status: Full Code  Consults: Dr. Elnoria Howard consulted.  Sent message to P. Johna Sheriff for routine cards evaluation / consult tomorrow  Family Communication: Husband at bedside, spoke with daughter on phone.  Severity of Illness: The appropriate patient status for this patient is OBSERVATION. Observation status is judged to be reasonable and necessary in order to provide the required intensity of service to ensure the  patient's safety. The patient's presenting symptoms, physical exam findings, and initial radiographic and laboratory data in the context of their medical condition is felt to place them at decreased risk for further clinical deterioration. Furthermore, it is anticipated that the patient will be medically stable for discharge from the hospital within 2 midnights of admission.   Author: Hillary Bow., DO 01/12/2023 9:50 PM  For on call review [email or web address].

## 2023-01-12 NOTE — ED Triage Notes (Signed)
Pt had 2 syncopal episodes at home with family members helping her in bathroom.  Pt states she only fell to knees and has bilat knee pain of 2/10.  Pt has hx of Parkinsons and she is A&O x4 with confused episodes which is baseline.  CBG - 105 mg/dL

## 2023-01-12 NOTE — Assessment & Plan Note (Signed)
Cont Metoprolol 

## 2023-01-12 NOTE — Assessment & Plan Note (Addendum)
Pt with recurrent GIB in setting of ongoing Eliquis use: hemoccult positive today and HGB 8.1 down from 9.6 on discharge. Dr Elnoria Howard will be doing repeat EGD tomorrow Clear liquids until MN NPO after MN Tele monitor Type and screen Hold eliquis - see A.Fib discussion below Repeat CBC in AM.

## 2023-01-12 NOTE — Assessment & Plan Note (Signed)
Cont synthroid 

## 2023-01-12 NOTE — ED Provider Notes (Signed)
[place] EMERGENCY DEPARTMENT AT [facility] Provider Note   CSN: [id number] Arrival date & time: 01/12/23  1523     History  Chief Complaint  Patient presents with   Loss of Consciousness    Anna Bernard is a 72 y.o. female with paroxysmal Afib and h/o PE on eliquis, GERD, parkinson disease, IDA, T2DM, HTN, HLD,  presents with LOC x 2 at home in her bathroom. Family caught her as she was falling, she did not hit her head but she did fall onto her BL knees.    Per chart review patient was admitted from 11/17/2022 to 11/20/2022 had multiple near syncopal episodes at home as well as a fall.  Patient followed up with her cardiologist and placed on an event monitor with plans to repeat echo however she had another episode of syncope on 229 and presented to the ED.  Was admitted and found to have hemoglobin 7.5 down from 10.3.  Had EGD on 3 1 that showed patchy mildly erythematous mucosa with stigmata of recent bleeding.  Eliquis was resumed on 3 2 per GI recommendation.  Patient also noted to have recurrent UTI/ESBL colonization.   Today patient reports that she has pain in her bilateral knees but otherwise feels normal. She denies any CP, SOB, leg swelling, abd pain, N/V/D/C/, dysuria/hematuria. She also recently had R foot hammertoe repairs 1-5 with Dr. Theresia Bough on 01/09/23 and hard soled shoe with pins in place, patient reports no f/c, no increased swelling/pain/numbness/tingling in the foot. She denies asymmetric weakness.      Loss of Consciousness      Home Medications Prior to Admission medications   Medication Sig Start Date End Date Taking? Authorizing Provider  Carbidopa-Levodopa ER (SINEMET CR) 25-100 MG tablet controlled release NEW PRESCRIPTION REQUEST: TAKE THREE TABLETS BY MOUTH AT 7:00AM and TAKE TWO TABLETS BY MOUTH AT 11:00AM and TAKE TWO TABLETS BY MOUTH AT 4:00PM Patient taking differently: NEW PRESCRIPTION REQUEST: TAKE THREE TABLETS BY MOUTH AT 7:00AM and  TAKE TWO TABLETS BY MOUTH AT 11:00AM and TAKE TWO TABLETS BY MOUTH AT 4:00PM. 3 tablets at 7 pm. 12/22/22   Billey Co, MD  diclofenac Sodium (VOLTAREN) 1 % GEL NEW PRESCRIPTION REQUEST: APPLY TWO GRAM TOPICALLY TO FEET TWICE DAILY Patient not taking: Reported on 01/05/2023 12/22/22   Billey Co, MD  ELIQUIS 5 MG TABS tablet NEW PRESCRIPTION REQUEST: TAKE ONE TABLET BY MOUTH TWICE DAILY 12/22/22   Billey Co, MD  ferrous sulfate 325 (65 FE) MG tablet TAKE 1 TABLET BY MOUTH EVERY DAY WITH BREAKFAST Patient not taking: Reported on 01/05/2023 01/04/22   Billey Co, MD  levothyroxine (SYNTHROID) 100 MCG tablet NEW PRESCRIPTION REQUEST: TAKE ONE TABLET BY MOUTH DAILY 12/22/22   Billey Co, MD  lidocaine 4 % Place 1 patch onto the skin daily. Patient not taking: Reported on 01/05/2023 01/02/23   Billey Co, MD  melatonin 3 MG TABS tablet Take 3 mg by mouth at bedtime as needed (For sleep). Patient not taking: Reported on 01/05/2023 01/24/22   [provider]  metoprolol tartrate (LOPRESSOR) 25 MG tablet NEW PRESCRIPTION REQUEST: TAKE 1/2 TABLET BY MOUTH TWICE DAILY 12/22/22   Billey Co, MD  Multiple Vitamin (MULTIVITAMIN WITH MINERALS) TABS Take 1 tablet by mouth daily with breakfast.    [provider]  oxyCODONE (OXY IR/ROXICODONE) 5 MG immediate release tablet Take 5 mg by mouth every 6 (six) hours as needed. 01/02/23  [provider]  pantoprazole (PROTONIX) 40 MG tablet NEW PRESCRIPTION REQUEST: TAKE ONE TABLET BY MOUTH TWICE DAILY 12/22/22   Billey Co, MD  polyethylene glycol (MIRALAX / GLYCOLAX) 17 g packet Take 17 g by mouth 2 (two) times daily. Patient not taking: Reported on 01/05/2023 11/20/22   Albertine Grates, MD  rosuvastatin (CRESTOR) 10 MG tablet Take 1 tablet (10 mg total) by mouth every evening. Patient not taking: Reported on 01/05/2023 12/27/22 12/27/23  Swinyer, Zachary George, NP  senna-docusate (SENOKOT-S) 8.6-50 MG tablet Take 1 tablet by  mouth at bedtime. Hold if diarrhea Patient not taking: Reported on 01/05/2023 11/20/22   Albertine Grates, MD  TYLENOL 8 HOUR ARTHRITIS PAIN 650 MG CR tablet Take 1,300 mg by mouth every 8 (eight) hours as needed for pain. Patient not taking: Reported on 01/05/2023    [provider]      Allergies    Patient has no known allergies.    Review of Systems   Review of Systems  Cardiovascular:  Positive for syncope.   Review of systems Negative for f/c, CP, SOB.  A 10 point review of systems was performed and is negative unless otherwise reported in HPI.  Physical Exam Updated Vital Signs BP (!) 162/84   Pulse 72   Temp 97.8 F (36.6 C)   Resp 16   Ht 5' 1.5" (1.562 m)   Wt 77.1 kg   SpO2 99%   BMI 31.60 kg/m  Physical Exam General: Normal appearing elderly female, lying in bed.  HEENT: PERRLA, EOMI,  Sclera anicteric, MMM, trachea midline. Tongue protrudes midline. Cardiology: RRR, no murmurs/rubs/gallops. BL radial and DP pulses equal bilaterally.  Resp: Normal respiratory rate and effort. CTAB, no wheezes, rhonchi, crackles.  Abd: Soft, non-tender, non-distended. No rebound tenderness or guarding.  GU: Deferred. MSK: S/p hammer toe repair of R foot with hard soled shoe and pins still present in toes. No erythema/induration, no signficant TTP. No peripheral edema or signs of trauma. Extremities without deformity or TTP. No cyanosis or clubbing. Skin: warm, dry. No rashes or lesions. Back: No CVA tenderness Neuro: A&Ox4, CNs II-XII grossly intact. 4/5 strength in all four extremities. Sensation grossly intact.  Psych: Normal mood and affect.   ED Results / Procedures / Treatments   Labs (all labs ordered are listed, but only abnormal results are displayed) Labs Reviewed  CBC WITH DIFFERENTIAL/PLATELET - Abnormal; Notable for the following components:      Result Value   RBC 3.33 (*)    Hemoglobin 8.1 (*)    HCT 25.1 (*)    MCV 75.4 (*)    MCH 24.3 (*)    RDW 17.3 (*)     All other components within normal limits  COMPREHENSIVE METABOLIC PANEL - Abnormal; Notable for the following components:   Potassium 3.4 (*)    Calcium 7.8 (*)    Total Protein 6.1 (*)    Albumin 2.8 (*)    AST 13 (*)    All other components within normal limits  URINALYSIS, ROUTINE W REFLEX MICROSCOPIC - Abnormal; Notable for the following components:   APPearance CLOUDY (*)    Hgb urine dipstick SMALL (*)    Ketones, ur 5 (*)    Nitrite POSITIVE (*)    Leukocytes,Ua LARGE (*)    Bacteria, UA RARE (*)    All other components within normal limits  POC OCCULT BLOOD, ED - Abnormal; Notable for the following components:   Fecal Occult Bld POSITIVE (*)  All other components within normal limits  MAGNESIUM  CBG MONITORING, ED  TROPONIN I (HIGH SENSITIVITY)  TROPONIN I (HIGH SENSITIVITY)    EKG EKG Interpretation  Date/Time:  Thursday January 12 2023 15:33:32 EDT Ventricular Rate:  76 PR Interval:  212 QRS Duration: 102 QT Interval:  408 QTC Calculation: 459 R Axis:   72 Text Interpretation: Sinus rhythm Borderline prolonged PR interval Abnormal R-wave progression, early transition Similar to prior EKGs Confirmed by Vivi Barrack [phone]) on 01/12/2023 7:38:54 PM  Radiology DG Knee 2 Views Right  Result Date: 01/12/2023 CLINICAL DATA:  Pain after fall EXAM: RIGHT KNEE - 3 VIEW COMPARISON:  None Available. FINDINGS: Trace joint effusion. Tricompartmental osteophyte formation seen. There is mild joint space loss of the lateral compartment. No fracture or dislocation. Adjacent ossific bodies. Preserved bone mineralization. IMPRESSION: Moderate degenerative changes greatest of the lateral compartment. Trace joint fluid. Electronically Signed   By: Karen Kays M.D.   On: 01/12/2023 17:33   DG Knee 2 Views Left  Result Date: 01/12/2023 CLINICAL DATA:  Fall onto knees EXAM: LEFT KNEE - 1-2 VIEW COMPARISON:  None Available. FINDINGS: Tricompartment degenerative changes throughout the  left knee with joint space narrowing and spurring, most pronounced in the medial compartment. No joint effusion. No acute bony abnormality. Specifically, no fracture, subluxation, or dislocation. Vascular calcifications noted. IMPRESSION: Moderate tricompartment degenerative changes. No acute bony abnormality. Electronically Signed   By: Charlett Nose M.D.   On: 01/12/2023 17:32    Procedures Procedures    Medications Ordered in ED Medications  acetaminophen (TYLENOL) tablet 1,000 mg (1,000 mg Oral Given 01/12/23 1653)    ED Course/ Medical Decision Making/ A&P                          Medical Decision Making Amount and/or Complexity of Data Reviewed Labs: ordered. Decision-making details documented in ED Course. Radiology: ordered. Decision-making details documented in ED Course.  Risk OTC drugs. Decision regarding hospitalization.    This patient presents to the ED for concern of LOC today, this involves an extensive number of treatment options, and is a complaint that carries with it a high risk of complications and morbidity.  I considered the following differential and admission for this acute, potentially life threatening condition. She is overall HDS and non-toxic appearing at this time.  MDM:    DDX for syncope includes but is not limited to:  Chart review patient was recently admitted for syncope in the setting of blood loss anemia and GI bleeding.  Patient and family deny any melena or hematochezia but will check fecal occult and hemoglobin here today.  She denies any chest pain or shortness of breath, is otherwise asymptomatic, lower concern for ACS/arrhythmia/PE.  Patient did have recent hammertoe repair surgery however she has no tachypnea, hypoxia, chest pain, signs or symptoms of DVT, and I believe the overall likelihood of PE causing her symptoms is low however if no other cause can be found, could consider dimer or CT PE.  She is focally neuro intact and low concern for  CVA, she did not hit her head or and has no headache, low concern for ICH.   Clinical Course as of 01/24/23 1143  Thu Jan 12, 2023  1701 Hemoglobin(!): 8.1 Dropped 1.5 points since 1 month ago. Will get fecal occult card. [HN]  1701 Glucose-Capillary: 78 [HN]  1757 Troponin I (High Sensitivity): 3 [HN]  1758 Magnesium: 1.8 [HN]  1758  DG Knee 2 Views Right Moderate degenerative changes greatest of the lateral compartment. Trace joint fluid.   [HN]  1758 DG Knee 2 Views Left Moderate tricompartment degenerative changes. No acute bony abnormality.   [HN]  1908 Fecal Occult Blood, POC(!): POSITIVE Patient with recurrent syncope, Hgb drop to 8.1, and still fecal occult positive on DRE. Will consult for admission. [HN]  1918 Consulted to hospitalist. Messaged Dr. Elnoria Howard w/ GI [HN]  2037 Called Dr. Haywood Pao Office [HN]    Clinical Course User Index [HN] Loetta Rough, MD    Labs: I Ordered, and personally interpreted labs.  The pertinent results include: Those listed above  Imaging Studies ordered: I ordered imaging studies including XR R and L knees I independently visualized and interpreted imaging. I agree with the radiologist interpretation  Additional history obtained from chart review, husband and daughter at bedside.  External records from outside source obtained and reviewed including foot and ankle notes  Cardiac Monitoring: The patient was maintained on a cardiac monitor.  I personally viewed and interpreted the cardiac monitored which showed an underlying rhythm of: NSR  Reevaluation: After the interventions noted above, I reevaluated the patient and found that they have :stayed the same  Social Determinants of Health: Patient lives at home with family and home health. On DC from most recent hospitalization, refused SNF placement.  Disposition:  Admit to hospital with GI following   Co morbidities that complicate the patient evaluation  Past Medical History:   Diagnosis Date   Anemia, unspecified    Carotid artery disease (HCC)    Hyperlipidemia, unspecified    Hypertension    Hypothyroidism, unspecified    Mild CAD    PAF (paroxysmal atrial fibrillation) (HCC)    Pre-diabetes    Pulmonary emboli (HCC)    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 by duplex 2023   Vitamin B12 deficiency      Medicines Meds ordered this encounter  Medications   acetaminophen (TYLENOL) tablet 1,000 mg    I have reviewed the patients home medicines and have made adjustments as needed  Problem List / ED Course: Problem List Items Addressed This Visit       Other   Iron deficiency anemia due to chronic blood loss   Other Visit Diagnoses     Syncope and collapse    -  Primary   Fecal occult blood test positive                       This note was created using dictation software, which may contain spelling or grammatical errors.    Loetta Rough, MD 01/24/23 1145

## 2023-01-12 NOTE — Assessment & Plan Note (Signed)
Cont Lopressor Holding eliquis in setting of GIB with ABLA Between 2 GIBs in 2 months + multiple syncope episodes + quite high fall risk with Parkinson's disease and ambulatory difficulty (arrives to cards office visit earlier this month in wheelchair) + normal renal function = I question if she isnt a candidate for a Watchman's procedure? so I'm admitting patient over to [facility] for cardiology to consult and consider that. The one area that may still be a complicating factor / question in all of this is that she did have a small PE at time her A.Fib was initially diagnosed 01/2022; however, no DVT was ever noted or reported, and the way the discharge summary from that admit reads seems to suggest a possible PFO as the source.  No other h/o PE except that one time. Did discuss my thought process with the family regarding above as well. I have sent a message to P. Johna Sheriff for routine cards evaluation / consult tomorrow during day hours.

## 2023-01-13 ENCOUNTER — Observation Stay (HOSPITAL_COMMUNITY): Payer: Medicare Other | Admitting: Certified Registered Nurse Anesthetist

## 2023-01-13 ENCOUNTER — Encounter (HOSPITAL_COMMUNITY)
Admission: EM | Disposition: A | Discharge status: Home / Self Care | Payer: Self-pay | Source: Home / Self Care | Attending: Family Medicine

## 2023-01-13 DIAGNOSIS — I252 Old myocardial infarction: Secondary | ICD-10-CM | POA: Diagnosis not present

## 2023-01-13 DIAGNOSIS — Z87891 Personal history of nicotine dependence: Secondary | ICD-10-CM

## 2023-01-13 DIAGNOSIS — I2510 Atherosclerotic heart disease of native coronary artery without angina pectoris: Secondary | ICD-10-CM | POA: Diagnosis not present

## 2023-01-13 DIAGNOSIS — I10 Essential (primary) hypertension: Secondary | ICD-10-CM

## 2023-01-13 DIAGNOSIS — D509 Iron deficiency anemia, unspecified: Secondary | ICD-10-CM | POA: Diagnosis not present

## 2023-01-13 DIAGNOSIS — K922 Gastrointestinal hemorrhage, unspecified: Secondary | ICD-10-CM | POA: Diagnosis not present

## 2023-01-13 DIAGNOSIS — K31811 Angiodysplasia of stomach and duodenum with bleeding: Secondary | ICD-10-CM | POA: Diagnosis not present

## 2023-01-13 DIAGNOSIS — K2950 Unspecified chronic gastritis without bleeding: Secondary | ICD-10-CM | POA: Diagnosis not present

## 2023-01-13 DIAGNOSIS — K921 Melena: Secondary | ICD-10-CM | POA: Diagnosis not present

## 2023-01-13 DIAGNOSIS — R55 Syncope and collapse: Secondary | ICD-10-CM | POA: Diagnosis not present

## 2023-01-13 DIAGNOSIS — I480 Paroxysmal atrial fibrillation: Secondary | ICD-10-CM | POA: Diagnosis not present

## 2023-01-13 HISTORY — PX: BIOPSY: SHX5522

## 2023-01-13 HISTORY — PX: ENTEROSCOPY: SHX5533

## 2023-01-13 LAB — CBC
HCT: 23.2 % — ABNORMAL LOW (ref 36.0–46.0)
Hemoglobin: 7.8 g/dL — ABNORMAL LOW (ref 12.0–15.0)
MCH: 24.7 pg — ABNORMAL LOW (ref 26.0–34.0)
MCHC: 33.6 g/dL (ref 30.0–36.0)
MCV: 73.4 fL — ABNORMAL LOW (ref 80.0–100.0)
Platelets: 331 10*3/uL (ref 150–400)
RBC: 3.16 MIL/uL — ABNORMAL LOW (ref 3.87–5.11)
RDW: 17.2 % — ABNORMAL HIGH (ref 11.5–15.5)
WBC: 7.1 10*3/uL (ref 4.0–10.5)
nRBC: 0 % (ref 0.0–0.2)

## 2023-01-13 LAB — TYPE AND SCREEN
ABO/RH(D): O POS
Antibody Screen: NEGATIVE

## 2023-01-13 LAB — BASIC METABOLIC PANEL
Anion gap: 10 (ref 5–15)
BUN: 7 mg/dL — ABNORMAL LOW (ref 8–23)
CO2: 26 mmol/L (ref 22–32)
Calcium: 8.4 mg/dL — ABNORMAL LOW (ref 8.9–10.3)
Chloride: 103 mmol/L (ref 98–111)
Creatinine, Ser: 0.73 mg/dL (ref 0.44–1.00)
GFR, Estimated: >60 mL/min (ref >60)
Glucose, Bld: 79 mg/dL (ref 70–99)
Potassium: 3.4 mmol/L — ABNORMAL LOW (ref 3.5–5.1)
Sodium: 139 mmol/L (ref 135–145)

## 2023-01-13 SURGERY — ENTEROSCOPY
Anesthesia: Monitor Anesthesia Care

## 2023-01-13 MED ORDER — PHENYLEPHRINE 80 MCG/ML (10ML) SYRINGE FOR IV PUSH (FOR BLOOD PRESSURE SUPPORT)
PREFILLED_SYRINGE | INTRAVENOUS | Status: DC | PRN
  Administered 2023-01-13: 80 ug via INTRAVENOUS
  Administered 2023-01-13: 160 ug via INTRAVENOUS

## 2023-01-13 MED ORDER — ALBUMIN HUMAN 5 % IV SOLN
12.5000 g | Freq: Once | INTRAVENOUS | Status: AC
  Administered 2023-01-13: 12.5 g via INTRAVENOUS

## 2023-01-13 MED ORDER — PROPOFOL 500 MG/50ML IV EMUL
INTRAVENOUS | Status: DC | PRN
  Administered 2023-01-13: 100 ug/kg/min via INTRAVENOUS

## 2023-01-13 MED ORDER — ALBUMIN HUMAN 25 % IV SOLN: 12.5000 g | Freq: Once | INTRAVENOUS | Status: DC

## 2023-01-13 MED ORDER — LACTATED RINGERS IV SOLN: INTRAVENOUS | Status: DC

## 2023-01-13 MED ORDER — PEG 3350-KCL-NA BICARB-NACL 420 G PO SOLR
4000.0000 mL | Freq: Once | ORAL | Status: AC
  Administered 2023-01-13: 4000 mL via ORAL
  Filled 2023-01-13: qty 4000

## 2023-01-13 MED ORDER — POTASSIUM CHLORIDE CRYS ER 20 MEQ PO TBCR
40.0000 meq | EXTENDED_RELEASE_TABLET | Freq: Once | ORAL | Status: AC
  Administered 2023-01-13: 40 meq via ORAL
  Filled 2023-01-13: qty 2

## 2023-01-13 MED ORDER — LIDOCAINE 2% (20 MG/ML) 5 ML SYRINGE
INTRAMUSCULAR | Status: DC | PRN
  Administered 2023-01-13: 40 mg via INTRAVENOUS

## 2023-01-13 SURGICAL SUPPLY — 15 items

## 2023-01-13 NOTE — Assessment & Plan Note (Addendum)
Hgb stable at 8.6 from baseline 11. Has history of prior GIB on Eliquis. Luckily, cardiology does not feel she is committed to chronic Eliquis as they suspect her history of PE in May 2023 was provoked by decreased ambulation. No evidence of bleed on EGD. Plan for colonoscopy today. - GI following, appreciate recs  - F/u colonoscopy today  - Continue IV BID PPI 40 mg  - Ferrous sulfate 325 mg daily  - Maintain 2 large bore IVs - Monitor vitals, cardiac telemetry  - AM CBC, transfusion threshold <8

## 2023-01-13 NOTE — Progress Notes (Addendum)
FMTS Interim Progress Note  Took over patient care from Dr. Julian Reil with [facility] for further workup for syncope/GI bleed.  Patient with past medical history of anemia, Parkinson's disease, CAD, HLD, HTN, PAF on Eliquis, and PE  presented to the ED for the second time in 2 months for recurrent syncope.  She is followed with cardiology who have done extensive workup including an echo that was unremarkable.  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  Given her anemia and positive FOBT patient is being transferred to [facility] for endoscopy/colonoscopy. Of note chart review show patient reported improvement of symptom with transfusion to correct anemia.  With concerns for possible GI bleed and history of PAF on anticoagulant there is consideration of whether patient would be a good candidate for watchman's procedure.  Dr. Elnoria Howard with GI is already notified and on board in the patient's management.   Saw patient at bedside after her transfer. She denies being in any acute distress. Vocalize not being in the mood to converse as she is tired and would like to go to sleep but able to report she has had multiple episodes of dizziness and syncope in the last few months and denies having bloody BM. O: BP (!) 150/85 (BP Location: Left Wrist)   Pulse 73   Temp 97.9 F (36.6 C) (Oral)   Resp 18   Ht 5' 1.5" (1.562 m)   Wt 77.1 kg   SpO2 100%   BMI 31.60 kg/m    Exam: Elderly lady, not in acute distress, generally fatigued with flat affect.   A/P:  Syncope Unclear cause of recurrent syncope, differential include anemia, atrial fibrillation, or dysautonomia in the setting of her Parkinson's disease. Of note has history of orthostatic hypotension. -Fall precautions -Orthostatic vitals -Continues cardiac monitoring  Anemia Hgb 8.1 on admission with positive FOBT. Given  age and patient being on Eliquis suspect GI bleed. Dr. Elnoria Howard with GI notified, planning EGD tomorrow 4/26. -GI following, appreciate Rec -Daily CBC -EGD scheduled for 4/26 -N.p.o. at midnight -Holding Eliquis  Paroxysmal A-fib Currently in normal sinus rhythm. -Continue Lopressor -Holding Eliquis for concerns of GI bleed -Continue cardiac monitoring   Jerre Simon, MD 01/13/2023, 12:44 AM PGY-2, [facility] Service pager [phone]

## 2023-01-13 NOTE — Progress Notes (Signed)
Daily Progress Note Intern Pager: [phone]  Patient name: Anna Bernard Medical record number: [id number] Date of birth: [birth year] Age: 72 y.o. Gender: female  Primary Care Provider: Billey Co, MD Consultants: Neurology, Cardiology  Code Status: Full Code  Pt Overview and Major Events to Date:  4/25 - Admitted and transferred to [facility]   Assessment and Plan: Anna Bernard is a 72 y.o. female with PMH of CAD, PAF on eliquis, small PE 01/2022, parkinson's disease, H/o GIB, who presented after syncopal episode. Concern for possible GIB while on anticoagulation.   * GI bleed Hgb 7.8 from baseline 11. Has history of prior GIB on eliquis.  - GI following, appreciate recs  - Endoscopy today  - Continue IV BID PPI 40 mg  - Ferrous sulfate 325 mg daily  - Maintain 2 large bore IVs - Monitor vitals, cardiac telemetry   Syncope Patient had syncopal episode most likely in the setting of ABLA. She is on eliquis and has had prior GIB requiring ablation. Hgb 7.8 today from baseline of 11. Cinamet has already been optimized to decrease risk for orthostatic hypotension. However, patient could have some element of baseline dysautonomia that is also contributing to syncopal episodes due to parkinson's.  - GI following, appreciate recommendations   - Endoscopy today  - Monitor vitals, cardiac telemetry  - AM CBC  - Has recent echo 11/2022 with EF 70-75%, possible PFO, Consider repeat ECHO  - Orthostatic vitals after EGD   Paroxysmal atrial fibrillation (HCC) Recently diagnosed in the last year. Was on Eliquis and metoprolol 12.5 mg BID at home. Followed by [facility].  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   Subjective:  Patient says she feels tired as she had a long night. Endorses mild lightheadedness. Husband at bedside reports that this is not patient's baseline and that she is usually more talkative.   Objective: Temp:  [97.8 F (36.6 C)-98.2 F (36.8 C)] 98.2 F (36.8 C) (04/26 1025) Pulse Rate:  [69-73] 70 (04/26 1025) Resp:  [16-19] 17 (04/26 1025) BP: (148-164)/(65-85) 148/65 (04/26 1025) SpO2:  [98 %-100 %] 98 % (04/26 1025) Weight:  [74.3 kg-77.1 kg] 74.3 kg (04/26 0500) Physical Exam: General: Chronically ill and tired appearing, but not somnolent, in no acute distress Cardiovascular: Irregular tachycardia, III/VI crescendo systolic flow murmur loudest at apex, hands slightly cold, cap refill = 3 seconds  Respiratory: no increased work of breathing on room air Abdomen: Soft, non tender to palpation, non distended Neuro: alert and oriented x 4, tired, PERRLA, right wrist flexed slightly at rest, mild rigidity of upper extremities   Laboratory: Most recent CBC Lab Results  Component Value Date   WBC 7.1 01/13/2023   HGB 7.8 (L) 01/13/2023   HCT 23.2 (L) 01/13/2023   MCV 73.4 (L) 01/13/2023   PLT 331 01/13/2023   Most recent BMP    Latest Ref Rng &  Units 01/13/2023    2:34 AM  BMP  Glucose 70 - 99 mg/dL 79   BUN 8 - 23 mg/dL 7   Creatinine 7.84 - 6.96 mg/dL 2.95   Sodium 284 - 132 mmol/L 139   Potassium 3.5 - 5.1 mmol/L 3.4   Chloride 98 - 111 mmol/L 103   CO2 22 - 32 mmol/L 26   Calcium 8.9 - 10.3 mg/dL 8.4    Lockie Mola, MD 01/13/2023, 11:44 AM  PGY-1, [facility] FPTS Intern pager: [phone], text pages welcome Secure chat group [facility] Teaching Service

## 2023-01-13 NOTE — Interval H&P Note (Signed)
History and Physical Interval Note:  01/13/2023 12:30 PM  Anna Bernard  has presented today for surgery, with the diagnosis of GI bleed.  The various methods of treatment have been discussed with the patient and family. After consideration of risks, benefits and other options for treatment, the patient has consented to  Procedure(s): ESOPHAGOGASTRODUODENOSCOPY (EGD) WITH PROPOFOL (N/A) as a surgical intervention.  The patient's history has been reviewed, patient examined, no change in status, stable for surgery.  I have reviewed the patient's chart and labs.  Questions were answered to the patient's satisfaction.     [name] D

## 2023-01-13 NOTE — Transfer of Care (Signed)
Immediate Anesthesia Transfer of Care Note  Patient: Anna Bernard  Procedure(s) Performed: ESOPHAGOGASTRODUODENOSCOPY (EGD) WITH PROPOFOL ENTEROSCOPY BIOPSY  Patient Location: Endoscopy Unit  Anesthesia Type:MAC  Level of Consciousness: drowsy  Airway & Oxygen Therapy: Patient Spontanous Breathing and Patient connected to nasal cannula oxygen  Post-op Assessment: Report given to RN and Post -op Vital signs reviewed and stable  Post vital signs: Reviewed and stable  Last Vitals:  Vitals Value Taken Time  BP 135/68 01/13/23 1308  Temp 36.4 C 01/13/23 1306  Pulse 68 01/13/23 1308  Resp 18 01/13/23 1308  SpO2 95 % 01/13/23 1308  Vitals shown include unvalidated device data.  Last Pain:  Vitals:   01/13/23 1306  TempSrc: Tympanic  PainSc: Asleep         Complications: No notable events documented.

## 2023-01-13 NOTE — Assessment & Plan Note (Addendum)
Patient had syncopal episode most likely in the setting of ABLA. Still no clear source of bleeding. Cardiac workup for syncope cause has been unremarkable.  - GI following, appreciate recommendations   - Colonoscopy today  - Cardiology following, appreciate recommendations - Monitor vitals, cardiac telemetry  - AM CBC

## 2023-01-13 NOTE — Op Note (Signed)
[facility] Patient Name: Anna Bernard Procedure Date : 01/13/2023 MRN: [id number] Attending MD: Jeani Hawking , MD, [id number] Date of Birth: Oct 11, 1950 CSN: [id number] Age: 72 Admit Type: Inpatient Procedure:                Small bowel enteroscopy Indications:              Iron deficiency anemia, Melena, Occult blood in                            stool Providers:                Jeani Hawking, MD, Marge Duncans, RN, Sunday Corn                            [place], Technician Referring MD:              Medicines:                Propofol per Anesthesia Complications:            No immediate complications. Estimated Blood Loss:     Estimated blood loss: none. Procedure:                Pre-Anesthesia Assessment:                           - Prior to the procedure, a History and Physical                            was performed, and patient medications and                            allergies were reviewed. The patient's tolerance of                            previous anesthesia was also reviewed. The risks                            and benefits of the procedure and the sedation                            options and risks were discussed with the patient.                            All questions were answered, and informed consent                            was obtained. Prior Anticoagulants: The patient has                            taken no anticoagulant or antiplatelet agents. ASA                            Grade Assessment: III - A patient with severe  systemic disease. After reviewing the risks and                            benefits, the patient was deemed in satisfactory                            condition to undergo the procedure.                           - Sedation was administered by an anesthesia                            professional. Deep sedation was attained.                           After obtaining informed consent, the endoscope  was                            passed under direct vision. Throughout the                            procedure, the patient's blood pressure, pulse, and                            oxygen saturations were monitored continuously. The                            [id number]-HQ190L ([id number] Olympus colonoscope was                            introduced through the mouth, and advanced to the                            small bowel distal to the Ligament of Treitz. After                            obtaining informed consent, the endoscope was                            passed under direct vision. Throughout the                            procedure, the patient's blood pressure, pulse, and                            oxygen saturations were monitored continuously.The                            small bowel enteroscopy was accomplished without                            difficulty. The patient tolerated the procedure  well. Scope In: Scope Out: Findings:      The esophagus was normal.      Diffuse atrophic mucosa was found in the entire examined stomach.       Biopsies were taken with a cold forceps for histology.      The examined duodenum was normal.      There was no evidence of significant pathology in the entire examined       portion of jejunum.      Two deep passes into the jejunum were performed. There was no evidence       of any bleeding, blood, inflammation, polyps, masses, or vascular       abnormalities in the small bowel. Impression:               - Normal esophagus.                           - Gastric mucosal atrophy. Biopsied.                           - Normal examined duodenum.                           - The examined portion of the jejunum was normal. Recommendation:           - Return patient to hospital ward for ongoing care.                           - Colonoscopy tomorrow.                           - Hold anticoagulation. Procedure Code(s):        ---  Professional ---                           [phone], Small intestinal endoscopy, enteroscopy                            beyond second portion of duodenum, not including                            ileum; with biopsy, single or multiple Diagnosis Code(s):        --- Professional ---                           K31.89, Other diseases of stomach and duodenum                           D50.9, Iron deficiency anemia, unspecified                           K92.1, Melena (includes Hematochezia)                           R19.5, Other fecal abnormalities CPT copyright 2022 [employer]. All rights reserved. The codes documented in this report are preliminary and upon coder review may  be revised to meet current compliance requirements. Jeani Hawking, MD Jeani Hawking, MD 01/13/2023 1:06:51  PM This report has been signed electronically. Number of Addenda: 0

## 2023-01-13 NOTE — Consult Note (Addendum)
Cardiology Consultation   Patient ID: Anna Bernard MRN: [id number]; DOB: 1951/01/06  Admit date: 01/12/2023 Date of Consult: 01/13/2023  PCP:  Billey Co, MD   [facility] Providers Cardiologist:  Armanda Magic, MD        Patient Profile:   Anna Bernard is a 72 y.o. female with a hx of PAF, HTN, HLD, Parkinson's disease, prediabetes, macrocytic anemia, chronic low back pain and frequent falls who is being seen 01/13/2023 for the evaluation of syncope and watchman procedure at the request of Dr. Julian Reil.  History of Present Illness:   Anna Bernard is a 72 year old female with past medical history of PAF, HTN, HLD, Parkinson's disease, prediabetes, macrocytic anemia, chronic low back pain and frequent falls.  She had paroxysmal fibrillation occurred in the setting of sepsis with UTI the PE in May 2023.  Her troponin was elevated.  Echocardiogram obtained on 01/17/2022 showed EF 60 to 65%, regional wall motion abnormality, mild hypokinesis of LV and basal inferior lateral wall, trivial MR.  Subsequent coronary CT obtained on 01/18/2022 demonstrated 25 to 49% disease in proximal left circumflex, normal large dominant RCA, normal LAD and the left main.  She was admitted to the hospital in February 2024 due to episode of syncope in the bathroom.  Her daughter and husband try to get her up when she passed out.  Echocardiogram obtained on 11/18/2022 demonstrated EF 70 to 75%, hyperdynamic LV, no significant valve issue.  It was also noted her hemoglobin dropped from 10.4 in September 2023 down to 7.5 in February 2024.  GI service was consulted and the patient was placed on iron therapy.  Her passing out spell was felt to be related to orthostatic changes associated with hypovolemia and symptomatic anemia.  Since discharge, she wore a 30-day heart monitor which showed heart rate ranges between 65-104, average heart rate of 78, 1 episode of nonsustained VT only lasting only for 5 beats.  No the  further workup was planned from the cardiac perspective.  She has not had any further passing out spell until yesterday.  She was sitting in a chair when her husband and her daughter was trying to get her up when she passed out again.  She denies any significant dizziness or flushing sensation prior to the event.  She has no recent chest pain or shortness of breath.  The passing out spell only lasted about 2 to 3 minutes.  She was admitted by family medicine service.  Overnight, telemetry shows no significant arrhythmia to explain her passing out spell.  She continued to be anemic with hemoglobin 8.1 ---> 7.8.  GI service was consulted due to positive Hemoccult and anemia.  She also has large amount of leukocyte and nitrite in the urinalysis.  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  Given no white blood cell count elevation and no SIRS, we will hold off on antibiotic at this time.  Due to her frequent falls and passing out spell, she was felt not to be a great candidate for long-term anticoagulation therapy, cardiology service consulted for syncope and evaluation for possible Watchman procedure.   Past Medical History:  Diagnosis Date   Anemia, unspecified    Carotid artery disease (HCC)    Hyperlipidemia, unspecified    Hypertension    Hypothyroidism, unspecified    Mild CAD    PAF (paroxysmal atrial fibrillation) (HCC)    Pre-diabetes  Pulmonary emboli (HCC)    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 by duplex 2023   Vitamin B12 deficiency     Past Surgical History:  Procedure Laterality Date   ABDOMINAL HYSTERECTOMY     ENTEROSCOPY N/A 11/18/2022   Procedure: ENTEROSCOPY;  Surgeon: Jeani Hawking, MD;  Location: [facility] ENDOSCOPY;  Service: Gastroenterology;  Laterality: N/A;   GIVENS CAPSULE STUDY N/A 04/16/2018   Procedure: GIVENS CAPSULE STUDY;  Surgeon: Jeani Hawking, MD;  Location: [facility] ENDOSCOPY;   Service: Endoscopy;  Laterality: N/A;   HOT HEMOSTASIS N/A 11/18/2022   Procedure: HOT HEMOSTASIS (ARGON PLASMA COAGULATION/BICAP);  Surgeon: Jeani Hawking, MD;  Location: [facility] ENDOSCOPY;  Service: Gastroenterology;  Laterality: N/A;   PARS PLANA VITRECTOMY Right 08/16/2020   Procedure: 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;  Surgeon: Carmela Rima, MD;  Location: [facility] OR;  Service: Ophthalmology;  Laterality: Right;   PARS PLANA VITRECTOMY Right 09/29/2020   Procedure: PARS PLANA VITRECTOMY WITH 25 GAUGE - ENDOCAUTRY;  Surgeon: Carmela Rima, MD;  Location: [facility] OR;  Service: Ophthalmology;  Laterality: Right;   PHOTOCOAGULATION WITH LASER Right 08/16/2020   Procedure: PHOTOCOAGULATION WITH LASER;  Surgeon: Carmela Rima, MD;  Location: [facility] OR;  Service: Ophthalmology;  Laterality: Right;   PHOTOCOAGULATION WITH LASER Right 09/29/2020   Procedure: PHOTOCOAGULATION WITH LASER;  Surgeon: Carmela Rima, MD;  Location: [facility] OR;  Service: Ophthalmology;  Laterality: Right;   SHOULDER SURGERY     TONSILLECTOMY     TUBAL LIGATION       Home Medications:  Prior to Admission medications   Medication Sig Start Date End Date Taking? Authorizing Provider  Carbidopa-Levodopa ER (SINEMET CR) 25-100 MG tablet controlled release NEW PRESCRIPTION REQUEST: TAKE THREE TABLETS BY MOUTH AT 7:00AM and TAKE TWO TABLETS BY MOUTH AT 11:00AM and TAKE TWO TABLETS BY MOUTH AT 4:00PM Patient taking differently: 2-3 tablets See admin instructions. Take 3 tablets by mouth at 7 AM, 2 tablets at 11 AM, and 2 tablets at 4 PM 12/22/22  Yes Pray, Milus Mallick, MD  ELIQUIS 5 MG TABS tablet NEW PRESCRIPTION REQUEST: TAKE ONE TABLET BY MOUTH TWICE DAILY Patient taking differently: Take 5 mg by mouth 2 (two) times daily. 12/22/22  Yes Pray, Milus Mallick, MD  levothyroxine (SYNTHROID) 100 MCG tablet NEW PRESCRIPTION REQUEST: TAKE ONE TABLET BY MOUTH DAILY Patient taking differently: Take 100 mcg by mouth daily  before breakfast. 12/22/22  Yes Pray, Milus Mallick, MD  melatonin 3 MG TABS tablet Take 3 mg by mouth at bedtime as needed (For sleep). 01/24/22  Yes [provider]  metoprolol tartrate (LOPRESSOR) 25 MG tablet NEW PRESCRIPTION REQUEST: TAKE 1/2 TABLET BY MOUTH TWICE DAILY Patient taking differently: Take 12.5 mg by mouth 2 (two) times daily. 12/22/22  Yes Pray, Milus Mallick, MD  Multiple Vitamin (MULTIVITAMIN WITH MINERALS) TABS Take 1 tablet by mouth daily with breakfast.   Yes [provider]  oxyCODONE (OXY IR/ROXICODONE) 5 MG immediate release tablet Take 5 mg by mouth every 6 (six) hours as needed (for pain). 01/02/23  Yes [provider]  pantoprazole (PROTONIX) 40 MG tablet NEW PRESCRIPTION REQUEST: TAKE ONE TABLET BY MOUTH TWICE DAILY Patient taking differently: Take 40 mg by mouth 2 (two) times daily before a meal. 12/22/22  Yes Pray, Milus Mallick, MD  senna-docusate (SENOKOT-S) 8.6-50 MG tablet Take 1 tablet by mouth at bedtime. Hold if diarrhea Patient taking differently: Take 1 tablet by mouth at bedtime as needed for  mild constipation (hold for diarrhea). 11/20/22  Yes Albertine Grates, MD  TYLENOL 8 HOUR ARTHRITIS PAIN 650 MG CR tablet Take 650-1,300 mg by mouth every 8 (eight) hours as needed for pain.   Yes [provider]  diclofenac Sodium (VOLTAREN) 1 % GEL NEW PRESCRIPTION REQUEST: APPLY TWO GRAM TOPICALLY TO FEET TWICE DAILY Patient not taking: Reported on 01/12/2023 12/22/22   Billey Co, MD  ferrous sulfate 325 (65 FE) MG tablet TAKE 1 TABLET BY MOUTH EVERY DAY WITH BREAKFAST Patient not taking: Reported on 01/12/2023 01/04/22   Billey Co, MD  lidocaine 4 % Place 1 patch onto the skin daily. Patient not taking: Reported on 01/12/2023 01/02/23   Billey Co, MD  polyethylene glycol (MIRALAX / GLYCOLAX) 17 g packet Take 17 g by mouth 2 (two) times daily. Patient not taking: Reported on 01/12/2023 11/20/22   Albertine Grates, MD  rosuvastatin (CRESTOR) 10 MG tablet  Take 1 tablet (10 mg total) by mouth every evening. Patient not taking: Reported on 01/12/2023 12/27/22 12/27/23  Swinyer, Zachary George, NP    Inpatient Medications: Scheduled Meds:  Carbidopa-Levodopa ER  2 tablet Oral BID   Carbidopa-Levodopa ER  3 tablet Oral Daily   ferrous sulfate  325 mg Oral Q breakfast   levothyroxine  100 mcg Oral Q0600   metoprolol tartrate  12.5 mg Oral BID   multivitamin with minerals  1 tablet Oral Q breakfast   pantoprazole (PROTONIX) IV  40 mg Intravenous Q12H   polyethylene glycol-electrolytes  4,000 mL Oral Once   Continuous Infusions:  lactated ringers Stopped (01/13/23 1330)   PRN Meds: acetaminophen **OR** acetaminophen, melatonin, ondansetron **OR** ondansetron (ZOFRAN) IV, oxyCODONE, senna-docusate  Allergies:   No Known Allergies  Social History:   Social History   Socioeconomic History   Marital status: Married    Spouse name: Not on file   Number of children: 1   Years of education: Not on file   Highest education level: Not on file  Occupational History   Occupation: retired  Tobacco Use   Smoking status: Former    Passive exposure: Past   Smokeless tobacco: Never   Tobacco comments:    quit 30+ years ago  Vaping Use   Vaping Use: Never used  Substance and Sexual Activity   Alcohol use: No    Alcohol/week: 0.0 standard drinks of alcohol   Drug use: No   Sexual activity: Not Currently  Other Topics Concern   Not on file  Social History Narrative   Right handed   Lives with husband in a one story home.   Caffeine once in a while   Social Determinants of Health   Financial Resource Strain: Low Risk  (01/05/2023)   Overall Financial Resource Strain (CARDIA)    Difficulty of Paying Living Expenses: Not hard at all  Food Insecurity: No Food Insecurity (01/13/2023)   Hunger Vital Sign    Worried About Running Out of Food in the Last Year: Never true    Ran Out of Food in the Last Year: Never true  Transportation Needs: No  Transportation Needs (01/13/2023)   PRAPARE - Administrator, Civil Service (Medical): No    Lack of Transportation (Non-Medical): No  Physical Activity: Inactive (01/05/2023)   Exercise Vital Sign    Days of Exercise per Week: 0 days    Minutes of Exercise per Session: 0 min  Stress: No Stress Concern Present (01/05/2023)   [employer] of Occupational  Health - Occupational Stress Questionnaire    Feeling of Stress : Not at all  Social Connections: Moderately Isolated (01/05/2023)   Social Connection and Isolation Panel [NHANES]    Frequency of Communication with Friends and Family: Three times a week    Frequency of Social Gatherings with Friends and Family: More than three times a week    Attends Religious Services: Never    Database administrator or Organizations: Patient unable to answer    Attends Banker Meetings: Never    Marital Status: Married  Catering manager Violence: Not At Risk (01/13/2023)   Humiliation, Afraid, Rape, and Kick questionnaire    Fear of Current or Ex-Partner: No    Emotionally Abused: No    Physically Abused: No    Sexually Abused: No    Family History:    Family History  Problem Relation Age of Onset   Renal Disease Sister    Cancer Sister      ROS:  Please see the history of present illness.   All other ROS reviewed and negative.     Physical Exam/Data:   Vitals:   01/13/23 1325 01/13/23 1340 01/13/23 1400 01/13/23 1415  BP: (!) 88/55 (!) 93/50 (!) 130/96 (!) 142/80  Pulse: 72 72 68 69  Resp: 15 16 17 18   Temp:      TempSrc:      SpO2: 97% 97% 97% 99%  Weight:      Height:        Intake/Output Summary (Last 24 hours) at 01/13/2023 1505 Last data filed at 01/13/2023 1300 Gross per 24 hour  Intake 630 ml  Output 880 ml  Net -250 ml      01/13/2023    5:00 AM 01/12/2023    3:38 PM 12/27/2022    9:06 AM  Last 3 Weights  Weight (lbs) 163 lb 12.8 oz 170 lb 156 lb  Weight (kg) 74.3 kg 77.111 kg 70.761 kg      Body mass index is 30.45 kg/m.  General:  Well nourished, well developed, in no acute distress HEENT: normal Neck: no JVD Vascular: No carotid bruits; Distal pulses 2+ bilaterally Cardiac:  normal S1, S2; RRR; no murmur  Lungs:  clear to auscultation bilaterally, no wheezing, rhonchi or rales  Abd: soft, nontender, no hepatomegaly  Ext: no edema Musculoskeletal:  No deformities, BUE and BLE strength normal and equal Skin: warm and dry  Neuro:  CNs 2-12 intact, no focal abnormalities noted Psych:  Normal affect   EKG:  The EKG was personally reviewed and demonstrates: Normal sinus rhythm, no significant ST-T wave changes  Telemetry:  Telemetry was personally reviewed and demonstrates: Telemetry reviewed, sinus rhythm, no significant bradycardia, pauses or irregular rhythm to explain the recent passing out spell.  Relevant CV Studies:  Coronary CT 01/18/2022 Aorta: Normal size.  Aortic root calcifications.  No dissection.   Aortic Valve:  Trileaflet.  Mild calcifications.   Coronary Arteries:  Normal coronary origin.  Right dominance.   RCA is a large dominant artery that gives rise to PDA and PLA. There is no plaque.   Left main is a large artery that gives rise to LAD and LCX arteries.   LAD is a large vessel that has no plaque.   LCX is a non-dominant artery that gives rise to one large OM1 branch. There is mild (25-49%) calcified plaque in the proximal LCX. The mid and distal LCX with no plaques.   Coronary Calcium Score:  Left main: 0   Left anterior descending artery: 0   Left circumflex artery: 212   Right coronary artery: 0   Total: 212   Percentile: 84   Other findings:   Normal pulmonary vein drainage into the left atrium.   Normal left atrial appendage without a thrombus.   Normal size of the pulmonary artery.   Moderate mitral annular calcification.   IMPRESSION: 1. Coronary calcium score of 212. This was 26 percentile for age and sex  matched control.   2. Normal coronary origin with right dominance.   3. CAD-RADS 2. Mild non-obstructive CAD (25-49%). Consider non-atherosclerotic causes of chest pain. Consider preventive therapy and risk factor modification.   4. Moderate mitral annular calcification.   The noncardiac portion of this study will be interpreted in separate report by the radiologist.    Event monitor 11/15/2022   Predominant rhythm was normal sinus rhythm with an average heart rate of 78 bpm and ranged from 65 to 104 bpm   1 episode of nonsustained ventricular tachycardia for 5 beats   Echo 11/18/2022  1. There is an intracavitary gradient likely related to hyperdynamic  function. Left ventricular ejection fraction, by estimation, is 70 to 75%.  The left ventricle has hyperdynamic function. Left ventricular endocardial  border not optimally defined to  evaluate regional wall motion. Indeterminate diastolic filling due to E-A  fusion.   2. Right ventricular systolic function is hyperdynamic. The right  ventricular size is normal.   3. Left atrial size was moderately dilated.   4. There is an echodensity in the right atrium seen on subcostal views.   5. The mitral valve is abnormal. No evidence of mitral valve  regurgitation. Moderate mitral annular calcification.   6. Aortic valve gradients may be due to hyperdynamic function. The aortic  valve was not well visualized. Aortic valve regurgitation is not  visualized.   Comparison(s): Prior images reviewed side by side. LVEF has increased. In  correlated to prior cardiac imaging, echodensity may be most consistent  with a prominent crista terminalis.    Laboratory Data:  High Sensitivity Troponin:   Recent Labs  Lab 01/12/23 1537 01/12/23 1806  TROPONINIHS 3 4     Chemistry Recent Labs  Lab 01/12/23 1537 01/13/23 0234  NA 139 139  K 3.4* 3.4*  CL 107 103  CO2 25 26  GLUCOSE 79 79  BUN 12 7*  CREATININE 0.79 0.73  CALCIUM 7.8*  8.4*  MG 1.8  --   GFRNONAA >60 >60  ANIONGAP 7 10    Recent Labs  Lab 01/12/23 1537  PROT 6.1*  ALBUMIN 2.8*  AST 13*  ALT 5  ALKPHOS 54  BILITOT 0.7   Lipids No results for input(s): "CHOL", "TRIG", "HDL", "LABVLDL", "LDLCALC", "CHOLHDL" in the last 168 hours.  Hematology Recent Labs  Lab 01/12/23 1537 01/13/23 0234  WBC 6.8 7.1  RBC 3.33* 3.16*  HGB 8.1* 7.8*  HCT 25.1* 23.2*  MCV 75.4* 73.4*  MCH 24.3* 24.7*  MCHC 32.3 33.6  RDW 17.3* 17.2*  PLT 362 331   Thyroid No results for input(s): "TSH", "FREET4" in the last 168 hours.  BNPNo results for input(s): "BNP", "PROBNP" in the last 168 hours.  DDimer No results for input(s): "DDIMER" in the last 168 hours.   Radiology/Studies:  DG Knee 2 Views Right  Result Date: 01/12/2023 CLINICAL DATA:  Pain after fall EXAM: RIGHT KNEE - 3 VIEW COMPARISON:  None Available. FINDINGS: Trace joint effusion. Tricompartmental  osteophyte formation seen. There is mild joint space loss of the lateral compartment. No fracture or dislocation. Adjacent ossific bodies. Preserved bone mineralization. IMPRESSION: Moderate degenerative changes greatest of the lateral compartment. Trace joint fluid. Electronically Signed   By: Karen Kays M.D.   On: 01/12/2023 17:33   DG Knee 2 Views Left  Result Date: 01/12/2023 CLINICAL DATA:  Fall onto knees EXAM: LEFT KNEE - 1-2 VIEW COMPARISON:  None Available. FINDINGS: Tricompartment degenerative changes throughout the left knee with joint space narrowing and spurring, most pronounced in the medial compartment. No joint effusion. No acute bony abnormality. Specifically, no fracture, subluxation, or dislocation. Vascular calcifications noted. IMPRESSION: Moderate tricompartment degenerative changes. No acute bony abnormality. Electronically Signed   By: Charlett Nose M.D.   On: 01/12/2023 17:32     Assessment and Plan:   Recurrent syncope   -Only blood pressure medication is 12.5 mg twice a day of  metoprolol tartrate for rate control.  Systolic blood pressure according to the daughter at home has been in the 120s to 140s which is a reasonable blood pressure goal for her.  -She has had admission back in February for syncope and underwent thorough workup including echocardiogram and a 30-day heart monitor, both of which were unrevealing.  Her syncope was felt to be related to orthostatic changes secondary to hypovolemia and symptomatic anemia.  I suspect her current syncope is related to the same.  Telemetry shows no significant arrhythmia, currently maintaining sinus rhythm.  Anemia need to be monitored closely.  She underwent endoscopy today which showed gastric mucosal atrophy but no clear bleeding source.  She is pending colonoscopy tomorrow.  Recurrent falls: Likely due to her frailty, anemia and her Parkinson's disorder.  I agree she is not a great candidate for long-term anticoagulation given the frequent fall.  She required a lot of assistance to get around at home due to Parkinson's disorder, will plan referral for Watchman evaluation.  At some point, we may have to permanently discontinue her anticoagulation therapy as risk outweigh the potential benefit.  Microcytic anemia with positive Hemoccult: Followed by GI.  Underwent endoscopy today without clear source of bleeding.  Pending colonoscopy tomorrow.  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  Per family medicine team note, will hold off on antibiotic given chronic colonization over the past several years and then no white blood cell count elevation or SIRS.  PAF: On Eliquis.  Currently maintaining sinus rhythm.  On 12.5 mg twice a day of metoprolol tartrate.  Unable to further reduce the medication.  Hypertension: On metoprolol tartrate 12.5 mg twice a day.  Hyperlipidemia  Parkinson's disease   Risk Assessment/Risk Scores:          CHA2DS2-VASc Score = 3   This indicates a 3.2% annual  risk of stroke. The patient's score is based upon: CHF History: 0 HTN History: 1 Diabetes History: 0 Stroke History: 0 Vascular Disease History: 0 Age Score: 1 Gender Score: 1     For questions or updates, please contact [facility] Please consult [email or web address] for contact info under    Ramond Dial, [place]  01/13/2023 3:05 PM  Patient seen and examined.  Agree with above documentation.  Ms. Wiatrek is a 72 year old female with history of paroxysmal atrial fibrillation, Parkinson's disease, hypertension who we are consulted by Dr. Julian Reil for evaluation of syncope.  She was admitted in May 2023 with PE, noted to have atrial fibrillation during admission  as well as sepsis with UTI.  Echocardiogram at that time showed EF 60 to 65%.  Underwent coronary CTA 01/18/2022 which showed mild disease in proximal LCx.  She was admitted in February 2024 with syncope.  Echocardiogram at that time showed EF 70 to 75%, no significant valvular disease.  Her hemoglobin was 7.5 during that admission, down from 10.4 05/2022.  GI was consulted and she was started on iron.  Her anticoagulation was not held.  She wore a 30-day monitor on discharge which showed 1 episode of NSVT lasting 5 beats.  She is currently admitted after another syncopal episode yesterday.  Her husband and daughter were getting her up from a chair when she passed out.  Lab work notable for hemoglobin down to 7.8.  GI was consulted and has FOBT was positive.  She underwent EGD today without clear source of bleeding, planning colonoscopy tomorrow.  EKG shows sinus rhythm, rate 76, no ST abnormalities. On exam, patient is alert and oriented, regular rate and rhythm, no murmurs, lungs CTAB, no LE edema or JVD.  Suspect her syncope is due to orthostatic hypotension in setting of Parkinson's disease, with hypovolemia and anemia contributing.  Given her frequent falls as well as suspected GI bleeding, she is not a good candidate for long-term  anticoagulation.  Will refer for watchman evaluation as outpatient.  Little Ishikawa, MD

## 2023-01-13 NOTE — Care Management Obs Status (Signed)
MEDICARE OBSERVATION STATUS NOTIFICATION   Patient Details  Name: Anna Bernard MRN: [id number] Date of Birth: [birth year]   Medicare Observation Status Notification Given:  Yes    Tom-Johnson, Hershal Coria, RN 01/13/2023, 4:21 PM

## 2023-01-13 NOTE — Progress Notes (Signed)
Contacted [name], rn up on Anna Bernard to let her know of pt's low bp and the reason for the delay to the unit.  Explained pt is received bottle of albumin per Dr Maple Hudson.

## 2023-01-13 NOTE — Assessment & Plan Note (Deleted)
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

## 2023-01-13 NOTE — Assessment & Plan Note (Signed)
Recently diagnosed in the last year. Was on Eliquis and metoprolol 12.5 mg BID at home. Followed by [facility]. Should not be on long term anticoagulation given fall risk.  - Cardiology consulted appreciate recs - Cardiology placed referral for watchman procedure evaluation outpatient - Hold eliquis in setting of possible GI Bleed. Would consider discontinuing in the future. - Continue metoprolol 12.5 BID  - Cardiac telemetry, monitor vitals

## 2023-01-13 NOTE — Anesthesia Preprocedure Evaluation (Signed)
Anesthesia Evaluation  Patient identified by MRN, date of birth, ID band Patient awake    Reviewed: Allergy & Precautions, NPO status , Patient's Chart, lab work & pertinent test results  History of Anesthesia Complications Negative for: history of anesthetic complications  Airway Mallampati: II  TM Distance: >3 FB Neck ROM: Full    Dental  (+) Dental Advisory Given   Pulmonary former smoker   breath sounds clear to auscultation       Cardiovascular hypertension, Pt. on medications and Pt. on home beta blockers + CAD, + Past MI and + Peripheral Vascular Disease   Rhythm:Regular   1. There is an intracavitary gradient likely related to hyperdynamic  function. Left ventricular ejection fraction, by estimation, is 70 to 75%.  The left ventricle has hyperdynamic function. Left ventricular endocardial  border not optimally defined to  evaluate regional wall motion. Indeterminate diastolic filling due to E-A  fusion.   2. Right ventricular systolic function is hyperdynamic. The right  ventricular size is normal.   3. Left atrial size was moderately dilated.   4. There is an echodensity in the right atrium seen on subcostal views.   5. The mitral valve is abnormal. No evidence of mitral valve  regurgitation. Moderate mitral annular calcification.   6. Aortic valve gradients may be due to hyperdynamic function. The aortic  valve was not well visualized. Aortic valve regurgitation is not  visualized.     Neuro/Psych parkinson    GI/Hepatic Neg liver ROS,GERD  ,,  Endo/Other  diabetesHypothyroidism  Lab Results      Component                Value               Date                      HGBA1C                   5.2                 11/14/2022             Renal/GU negative Renal ROSLab Results      Component                Value               Date                      CREATININE               0.73                01/13/2023                 Musculoskeletal   Abdominal   Peds  Hematology  (+) Blood dyscrasia, anemia Lab Results      Component                Value               Date                      WBC                      7.1                 01/13/2023  HGB                      7.8 (L)             01/13/2023                HCT                      23.2 (L)            01/13/2023                MCV                      73.4 (L)            01/13/2023                PLT                      331                 01/13/2023              Anesthesia Other Findings   Reproductive/Obstetrics                             Anesthesia Physical Anesthesia Plan  ASA: 3  Anesthesia Plan: MAC   Post-op Pain Management: Minimal or no pain anticipated   Induction: Intravenous  PONV Risk Score and Plan: 2 and Propofol infusion  Airway Management Planned: Nasal Cannula and Natural Airway  Additional Equipment: None  Intra-op Plan:   Post-operative Plan:   Informed Consent: I have reviewed the patients History and Physical, chart, labs and discussed the procedure including the risks, benefits and alternatives for the proposed anesthesia with the patient or authorized representative who has indicated his/her understanding and acceptance.     Dental advisory given  Plan Discussed with: CRNA  Anesthesia Plan Comments:         Anesthesia Quick Evaluation

## 2023-01-14 ENCOUNTER — Other Ambulatory Visit: Payer: Self-pay | Admitting: Cardiology

## 2023-01-14 DIAGNOSIS — R195 Other fecal abnormalities: Secondary | ICD-10-CM

## 2023-01-14 DIAGNOSIS — I4891 Unspecified atrial fibrillation: Secondary | ICD-10-CM

## 2023-01-14 DIAGNOSIS — I951 Orthostatic hypotension: Secondary | ICD-10-CM | POA: Diagnosis present

## 2023-01-14 DIAGNOSIS — Z7901 Long term (current) use of anticoagulants: Secondary | ICD-10-CM | POA: Diagnosis not present

## 2023-01-14 DIAGNOSIS — W19XXXA Unspecified fall, initial encounter: Secondary | ICD-10-CM | POA: Diagnosis not present

## 2023-01-14 DIAGNOSIS — Z9189 Other specified personal risk factors, not elsewhere classified: Secondary | ICD-10-CM | POA: Diagnosis not present

## 2023-01-14 DIAGNOSIS — D509 Iron deficiency anemia, unspecified: Secondary | ICD-10-CM | POA: Diagnosis not present

## 2023-01-14 DIAGNOSIS — R54 Age-related physical debility: Secondary | ICD-10-CM | POA: Diagnosis present

## 2023-01-14 DIAGNOSIS — E785 Hyperlipidemia, unspecified: Secondary | ICD-10-CM | POA: Diagnosis present

## 2023-01-14 DIAGNOSIS — Z79890 Hormone replacement therapy (postmenopausal): Secondary | ICD-10-CM | POA: Diagnosis not present

## 2023-01-14 DIAGNOSIS — K2951 Unspecified chronic gastritis with bleeding: Secondary | ICD-10-CM

## 2023-01-14 DIAGNOSIS — Z87891 Personal history of nicotine dependence: Secondary | ICD-10-CM | POA: Diagnosis not present

## 2023-01-14 DIAGNOSIS — D126 Benign neoplasm of colon, unspecified: Secondary | ICD-10-CM | POA: Diagnosis not present

## 2023-01-14 DIAGNOSIS — K922 Gastrointestinal hemorrhage, unspecified: Secondary | ICD-10-CM | POA: Diagnosis not present

## 2023-01-14 DIAGNOSIS — I2510 Atherosclerotic heart disease of native coronary artery without angina pectoris: Secondary | ICD-10-CM | POA: Diagnosis present

## 2023-01-14 DIAGNOSIS — D500 Iron deficiency anemia secondary to blood loss (chronic): Secondary | ICD-10-CM | POA: Diagnosis not present

## 2023-01-14 DIAGNOSIS — K219 Gastro-esophageal reflux disease without esophagitis: Secondary | ICD-10-CM | POA: Diagnosis present

## 2023-01-14 DIAGNOSIS — K5731 Diverticulosis of large intestine without perforation or abscess with bleeding: Secondary | ICD-10-CM | POA: Diagnosis present

## 2023-01-14 DIAGNOSIS — K921 Melena: Secondary | ICD-10-CM | POA: Diagnosis present

## 2023-01-14 DIAGNOSIS — D122 Benign neoplasm of ascending colon: Secondary | ICD-10-CM | POA: Diagnosis present

## 2023-01-14 DIAGNOSIS — K5909 Other constipation: Secondary | ICD-10-CM | POA: Diagnosis present

## 2023-01-14 DIAGNOSIS — E119 Type 2 diabetes mellitus without complications: Secondary | ICD-10-CM | POA: Diagnosis present

## 2023-01-14 DIAGNOSIS — I252 Old myocardial infarction: Secondary | ICD-10-CM | POA: Diagnosis not present

## 2023-01-14 DIAGNOSIS — E039 Hypothyroidism, unspecified: Secondary | ICD-10-CM | POA: Diagnosis present

## 2023-01-14 DIAGNOSIS — K3189 Other diseases of stomach and duodenum: Secondary | ICD-10-CM | POA: Diagnosis present

## 2023-01-14 DIAGNOSIS — I480 Paroxysmal atrial fibrillation: Secondary | ICD-10-CM

## 2023-01-14 DIAGNOSIS — B9620 Unspecified Escherichia coli [E. coli] as the cause of diseases classified elsewhere: Secondary | ICD-10-CM | POA: Diagnosis present

## 2023-01-14 DIAGNOSIS — R55 Syncope and collapse: Secondary | ICD-10-CM | POA: Diagnosis present

## 2023-01-14 DIAGNOSIS — D120 Benign neoplasm of cecum: Secondary | ICD-10-CM | POA: Diagnosis present

## 2023-01-14 DIAGNOSIS — K5730 Diverticulosis of large intestine without perforation or abscess without bleeding: Secondary | ICD-10-CM | POA: Diagnosis not present

## 2023-01-14 DIAGNOSIS — K648 Other hemorrhoids: Secondary | ICD-10-CM | POA: Diagnosis present

## 2023-01-14 DIAGNOSIS — I10 Essential (primary) hypertension: Secondary | ICD-10-CM | POA: Diagnosis present

## 2023-01-14 DIAGNOSIS — R296 Repeated falls: Secondary | ICD-10-CM | POA: Diagnosis present

## 2023-01-14 DIAGNOSIS — E861 Hypovolemia: Secondary | ICD-10-CM | POA: Diagnosis present

## 2023-01-14 DIAGNOSIS — G20A1 Parkinson's disease without dyskinesia, without mention of fluctuations: Secondary | ICD-10-CM | POA: Diagnosis present

## 2023-01-14 DIAGNOSIS — D62 Acute posthemorrhagic anemia: Secondary | ICD-10-CM | POA: Diagnosis present

## 2023-01-14 DIAGNOSIS — Z1612 Extended spectrum beta lactamase (ESBL) resistance: Secondary | ICD-10-CM | POA: Diagnosis present

## 2023-01-14 LAB — CBC WITH DIFFERENTIAL/PLATELET
Abs Immature Granulocytes: 0.02 10*3/uL (ref 0.00–0.07)
Basophils Absolute: 0 10*3/uL (ref 0.0–0.1)
Basophils Relative: 1 %
Eosinophils Absolute: 0.1 10*3/uL (ref 0.0–0.5)
Eosinophils Relative: 1 %
HCT: 25.6 % — ABNORMAL LOW (ref 36.0–46.0)
Hemoglobin: 8.3 g/dL — ABNORMAL LOW (ref 12.0–15.0)
Immature Granulocytes: 0 %
Lymphocytes Relative: 16 %
Lymphs Abs: 1.1 10*3/uL (ref 0.7–4.0)
MCH: 24.2 pg — ABNORMAL LOW (ref 26.0–34.0)
MCHC: 32.4 g/dL (ref 30.0–36.0)
MCV: 74.6 fL — ABNORMAL LOW (ref 80.0–100.0)
Monocytes Absolute: 0.4 10*3/uL (ref 0.1–1.0)
Monocytes Relative: 6 %
Neutro Abs: 5 10*3/uL (ref 1.7–7.7)
Neutrophils Relative %: 76 %
Platelets: 364 10*3/uL (ref 150–400)
RBC: 3.43 MIL/uL — ABNORMAL LOW (ref 3.87–5.11)
RDW: 17.3 % — ABNORMAL HIGH (ref 11.5–15.5)
WBC: 6.6 10*3/uL (ref 4.0–10.5)
nRBC: 0 % (ref 0.0–0.2)

## 2023-01-14 LAB — BASIC METABOLIC PANEL
Anion gap: 8 (ref 5–15)
BUN: 7 mg/dL — ABNORMAL LOW (ref 8–23)
CO2: 26 mmol/L (ref 22–32)
Calcium: 8.6 mg/dL — ABNORMAL LOW (ref 8.9–10.3)
Chloride: 107 mmol/L (ref 98–111)
Creatinine, Ser: 0.73 mg/dL (ref 0.44–1.00)
GFR, Estimated: >60 mL/min (ref >60)
Glucose, Bld: 90 mg/dL (ref 70–99)
Potassium: 4 mmol/L (ref 3.5–5.1)
Sodium: 141 mmol/L (ref 135–145)

## 2023-01-14 MED ORDER — LINACLOTIDE 145 MCG PO CAPS
290.0000 ug | ORAL_CAPSULE | Freq: Once | ORAL | Status: AC
  Administered 2023-01-14: 290 ug via ORAL
  Filled 2023-01-14: qty 2

## 2023-01-14 MED ORDER — PEG-KCL-NACL-NASULF-NA ASC-C 100 G PO SOLR
0.5000 | Freq: Once | ORAL | Status: AC
  Administered 2023-01-15: 100 g via ORAL

## 2023-01-14 MED ORDER — PEG-KCL-NACL-NASULF-NA ASC-C 100 G PO SOLR
0.5000 | Freq: Once | ORAL | Status: AC
  Administered 2023-01-14: 100 g via ORAL
  Filled 2023-01-14: qty 1

## 2023-01-14 MED ORDER — PEG-KCL-NACL-NASULF-NA ASC-C 100 G PO SOLR: 1.0000 | Freq: Once | ORAL | Status: DC

## 2023-01-14 MED ORDER — BISACODYL 5 MG PO TBEC
20.0000 mg | DELAYED_RELEASE_TABLET | Freq: Once | ORAL | Status: AC
  Administered 2023-01-14: 20 mg via ORAL
  Filled 2023-01-14: qty 4

## 2023-01-14 NOTE — H&P (View-Only) (Signed)
     [facility] Progress Note  CC:  Hemoccult positive stools  Assessment / Plan: Recurrent GI blood loss anemia with hemoccult positive stools in the setting of multiple co-morbidities and is on Eliquis as an outpatient for atrial fibrillation. Hemoglobin is overall stable. There has been no overt bleeding. She has chronic constipation but no other localizing symptoms. Hospitalized for GI bleed earlier this year.   Wording from GI consult 11/18/22 notes "On 03/21/2018 the EGD and colonoscopy did not show any overtly bleeding sites, but there was the possibility of an atypical AVM in the descending colon. 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   Recommendations: - Additional bowel prep today with plans for colonoscopy tomorrow.  - Continue serial hgb/hct with transfusion in the meantime.  - Clear liquids today with NPO at midnight.  - If colonoscopy is negative, will plan repeat capsule endoscopy.  - Continue to weight risks and benefits of chronic anticoagulation in the setting of recurrent GI bleeding  Subjective: No overt bleeding following her enteroscopy yesterday, but the patient is blind. Did not finish her bowel prep in preparation for colonoscopy today. Her husband of 42 years is present and denies any knowledge of overt GI bleeding. GI ROS is otherwise negative.   Objective:  Vital signs in last 24 hours: Temp:  [97.5 F (36.4 C)-98.8 F (37.1 C)] 98.6 F (37 C) (04/27 0507) Pulse Rate:  [68-75] 73 (04/27 0507) Resp:  [15-19] 18 (04/27 0507) BP: (67-170)/(41-96) 129/71 (04/27 0507) SpO2:  [94 %-100 %] 98 % (04/27 0507) Last BM Date : 01/13/23 General:   Alert, in NAD, lying in the hospital bed Heart:  Regular rate and rhythm; no murmurs Pulm: Clear anteriorly; no wheezing Abdomen:   Soft. Nontender. Nondistended. Normal bowel sounds. No rebound or guarding. Neurologic:  Alert; grossly normal neurologically. Psych:  Alert and cooperative. Normal mood and affect.  Endoscopy results: Push enteroscopy 01/13/23: - Normal esophagus. - Gastric mucosal atrophy. Biopsied. - Normal examined duodenum. - The examined portion of the jejunum was normal. Colonoscopy recommended.   Lab Results: Recent Labs    01/12/23 1537 01/13/23 0234 01/14/23 0051  WBC 6.8 7.1 6.6  HGB 8.1* 7.8* 8.3*  HCT 25.1* 23.2* 25.6*  PLT 362 331 364   BMET Recent Labs    01/12/23 1537 01/13/23 0234 01/14/23 0051  NA 139 139 141  K 3.4* 3.4* 4.0  CL 107 103 107  CO2 25 26 26   GLUCOSE 79 79 90  BUN 12 7* 7*  CREATININE 0.79 0.73 0.73  CALCIUM 7.8* 8.4* 8.6*   LFT Recent Labs    01/12/23 1537  PROT 6.1*  ALBUMIN 2.8*  AST 13*  ALT 5  ALKPHOS 54  BILITOT 0.7       LOS: 0 days   Tressia Danas  01/14/2023, 9:31 AM

## 2023-01-14 NOTE — Plan of Care (Signed)
  Problem: Clinical Measurements: Goal: Diagnostic test results will improve Outcome: Not Progressing   Problem: Activity: Goal: Risk for activity intolerance will decrease Outcome: Not Progressing   Problem: Bowel/Gastric: Goal: Will show no signs and symptoms of gastrointestinal bleeding Outcome: Not Progressing

## 2023-01-14 NOTE — Progress Notes (Signed)
At 0050, informed Dr. Elliot Gurney that patient has not finished her bowel prep and is already refusing to take any further. Per dr. Elliot Gurney, to ask patient to drink one more cup of bowel preparation. Went to patient room and patient strongly refused top have more. Notified Dr. Elliot Gurney.

## 2023-01-14 NOTE — Progress Notes (Signed)
Progress Note  Patient Name: Anna Bernard Date of Encounter: 01/14/2023  Primary Cardiologist:   Armanda Magic, MD   Subjective   She denies chest pain or SOB.   Inpatient Medications    Scheduled Meds:  bisacodyl  20 mg Oral Once   Carbidopa-Levodopa ER  2 tablet Oral BID   Carbidopa-Levodopa ER  3 tablet Oral Daily   ferrous sulfate  325 mg Oral Q breakfast   levothyroxine  100 mcg Oral Q0600   linaclotide  290 mcg Oral Once   metoprolol tartrate  12.5 mg Oral BID   multivitamin with minerals  1 tablet Oral Q breakfast   pantoprazole (PROTONIX) IV  40 mg Intravenous Q12H   peg 3350 powder  0.5 kit Oral Once   And   [START ON 01/15/2023] peg 3350 powder  0.5 kit Oral Once   Continuous Infusions:  lactated ringers Stopped (01/13/23 1330)   PRN Meds: acetaminophen **OR** acetaminophen, melatonin, ondansetron **OR** ondansetron (ZOFRAN) IV, oxyCODONE, senna-docusate   Vital Signs    Vitals:   01/13/23 2057 01/14/23 0007 01/14/23 0507 01/14/23 0937  BP: (!) 152/70 (!) 170/64 129/71 (!) 174/84  Pulse: 74 75 73 80  Resp: 18 18 18 18   Temp: 98.8 F (37.1 C) 97.7 F (36.5 C) 98.6 F (37 C) 98 F (36.7 C)  TempSrc: Oral Oral Oral   SpO2: 100% 99% 98% 99%  Weight:      Height:        Intake/Output Summary (Last 24 hours) at 01/14/2023 1057 Last data filed at 01/14/2023 0102 Gross per 24 hour  Intake 150 ml  Output 3030 ml  Net -2880 ml   Filed Weights   01/12/23 1538 01/13/23 0500  Weight: 77.1 kg 74.3 kg    Telemetry    NSR - Personally Reviewed  ECG    NA - Personally Reviewed  Physical Exam   GEN: No acute distress.   Neck: No  JVD Cardiac: RRR, no murmurs, rubs, or gallops.  Respiratory: Clear  to auscultation bilaterally. GI: Soft, nontender, non-distended  MS: No  edema; No deformity. Neuro:  Nonfocal  Psych: Normal affect   Labs    Chemistry Recent Labs  Lab 01/12/23 1537 01/13/23 0234 01/14/23 0051  NA 139 139 141  K 3.4*  3.4* 4.0  CL 107 103 107  CO2 25 26 26   GLUCOSE 79 79 90  BUN 12 7* 7*  CREATININE 0.79 0.73 0.73  CALCIUM 7.8* 8.4* 8.6*  PROT 6.1*  --   --   ALBUMIN 2.8*  --   --   AST 13*  --   --   ALT 5  --   --   ALKPHOS 54  --   --   BILITOT 0.7  --   --   GFRNONAA >60 >60 >60  ANIONGAP 7 10 8      Hematology Recent Labs  Lab 01/12/23 1537 01/13/23 0234 01/14/23 0051  WBC 6.8 7.1 6.6  RBC 3.33* 3.16* 3.43*  HGB 8.1* 7.8* 8.3*  HCT 25.1* 23.2* 25.6*  MCV 75.4* 73.4* 74.6*  MCH 24.3* 24.7* 24.2*  MCHC 32.3 33.6 32.4  RDW 17.3* 17.2* 17.3*  PLT 362 331 364    Cardiac EnzymesNo results for input(s): "TROPONINI" in the last 168 hours. No results for input(s): "TROPIPOC" in the last 168 hours.   BNPNo results for input(s): "BNP", "PROBNP" in the last 168 hours.   DDimer No results for input(s): "DDIMER" in the  last 168 hours.   Radiology    DG Knee 2 Views Right  Result Date: 01/12/2023 CLINICAL DATA:  Pain after fall EXAM: RIGHT KNEE - 3 VIEW COMPARISON:  None Available. FINDINGS: Trace joint effusion. Tricompartmental osteophyte formation seen. There is mild joint space loss of the lateral compartment. No fracture or dislocation. Adjacent ossific bodies. Preserved bone mineralization. IMPRESSION: Moderate degenerative changes greatest of the lateral compartment. Trace joint fluid. Electronically Signed   By: Karen Kays M.D.   On: 01/12/2023 17:33   DG Knee 2 Views Left  Result Date: 01/12/2023 CLINICAL DATA:  Fall onto knees EXAM: LEFT KNEE - 1-2 VIEW COMPARISON:  None Available. FINDINGS: Tricompartment degenerative changes throughout the left knee with joint space narrowing and spurring, most pronounced in the medial compartment. No joint effusion. No acute bony abnormality. Specifically, no fracture, subluxation, or dislocation. Vascular calcifications noted. IMPRESSION: Moderate tricompartment degenerative changes. No acute bony abnormality. Electronically Signed   By: Charlett Nose M.D.   On: 01/12/2023 17:32    Cardiac Studies   NA  Patient Profile     72 y.o. female with a hx of PAF, HTN, HLD, Parkinson's disease, prediabetes, macrocytic anemia, chronic low back pain and frequent falls who is being seen 01/13/2023 for the evaluation of syncope and watchman procedure at the request of Dr. Julian Reil.   Assessment & Plan    Syncope:  Previous extensive work up.  Likely orthostatic hypotension.  No further work up in patient.  Seems to be tolerating the current low dose meds.    Atrial fib:  Appropriate request to consider Watchman.   She has a history of paroxysmal atrial fibrillation.  This patients CHA2DS2-VASc Score and unadjusted Ischemic Stroke Rate (% per year) is equal to 3.2 % stroke rate/year from a score of 3 which necessitates long term oral anticoagulation to prevent stroke.  Unfortunately, She is not felt to be a long term Warfarin candidate secondary to falls, syncope and frailty.  The patients chart has been reviewed and I feel that they would be a candidate for short term oral anticoagulation.  Procedural risks for the Watchman implant have been reviewed with the patient including a 1% risk of stroke, 2% risk of perforation, 0.1% risk of device embolization.  Given the patient's poor candidacy for long-term oral anticoagulation and ability to tolerate short term oral anticoagulation I have recommended the watchman left atrial appendage closure system.  I will ask staff to arrange follow up.  I did look back at the May 2023 note and she had PE noted incidentally on her CT coronary.    This was likely provoked by decreased ambulation so I don't think that she is committed to chronic Eliquis for this.     For questions or updates, please contact [facility] Please consult [email or web address] for contact info under Cardiology/STEMI.   Signed, Rollene Rotunda, MD  01/14/2023, 10:57 AM

## 2023-01-14 NOTE — Plan of Care (Signed)
  Problem: Education: Goal: Knowledge of General Education information will improve Description Including pain rating scale, medication(s)/side effects and non-pharmacologic comfort measures Outcome: Progressing   

## 2023-01-14 NOTE — Progress Notes (Addendum)
     [facility] Progress Note  CC:  Hemoccult positive stools  Assessment / Plan: Recurrent GI blood loss anemia with hemoccult positive stools in the setting of multiple co-morbidities and is on Eliquis as an outpatient for atrial fibrillation. Hemoglobin is overall stable. There has been no overt bleeding. She has chronic constipation but no other localizing symptoms. Hospitalized for GI bleed earlier this year.   Wording from GI consult 11/18/22 notes "On 03/21/2018 the EGD and colonoscopy did not show any overtly bleeding sites, but there was the possibility of an atypical AVM in the descending colon. 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   Recommendations: - Additional bowel prep today with plans for colonoscopy tomorrow.  - Continue serial hgb/hct with transfusion in the meantime.  - Clear liquids today with NPO at midnight.  - If colonoscopy is negative, will plan repeat capsule endoscopy.  - Continue to weight risks and benefits of chronic anticoagulation in the setting of recurrent GI bleeding  Subjective: No overt bleeding following her enteroscopy yesterday, but the patient is blind. Did not finish her bowel prep in preparation for colonoscopy today. Her husband of 42 years is present and denies any knowledge of overt GI bleeding. GI ROS is otherwise negative.   Objective:  Vital signs in last 24 hours: Temp:  [97.5 F (36.4 C)-98.8 F (37.1 C)] 98.6 F (37 C) (04/27 0507) Pulse Rate:  [68-75] 73 (04/27 0507) Resp:  [15-19] 18 (04/27 0507) BP: (67-170)/(41-96) 129/71 (04/27 0507) SpO2:  [94 %-100 %] 98 % (04/27 0507) Last BM Date : 01/13/23 General:   Alert, in NAD, lying in the hospital bed Heart:  Regular rate and rhythm; no murmurs Pulm: Clear anteriorly; no wheezing Abdomen:   Soft. Nontender. Nondistended. Normal bowel sounds. No rebound or guarding. Neurologic:  Alert; grossly normal neurologically. Psych:  Alert and cooperative. Normal mood and affect.  Endoscopy results: Push enteroscopy 01/13/23: - Normal esophagus. - Gastric mucosal atrophy. Biopsied. - Normal examined duodenum. - The examined portion of the jejunum was normal. Colonoscopy recommended.   Lab Results: Recent Labs    01/12/23 1537 01/13/23 0234 01/14/23 0051  WBC 6.8 7.1 6.6  HGB 8.1* 7.8* 8.3*  HCT 25.1* 23.2* 25.6*  PLT 362 331 364   BMET Recent Labs    01/12/23 1537 01/13/23 0234 01/14/23 0051  NA 139 139 141  K 3.4* 3.4* 4.0  CL 107 103 107  CO2 25 26 26  GLUCOSE 79 79 90  BUN 12 7* 7*  CREATININE 0.79 0.73 0.73  CALCIUM 7.8* 8.4* 8.6*   LFT Recent Labs    01/12/23 1537  PROT 6.1*  ALBUMIN 2.8*  AST 13*  ALT 5  ALKPHOS 54  BILITOT 0.7       LOS: 0 days   Rudy Luhmann  01/14/2023, 9:31 AM    

## 2023-01-14 NOTE — Progress Notes (Signed)
Daily Progress Note Intern Pager: [phone]  Patient name: Anna Bernard Medical record number: [id number] Date of birth: October 25, 1950 Age: 72 y.o. Gender: female  Primary Care Provider: Billey Co, MD Consultants: Neurology, Cardiology  Code Status: Full Code   Pt Overview and Major Events to Date:  4/25 - Admitted and transferred to [facility]    Assessment and Plan: Anna Bernard is a 72 y.o. female with PMH of CAD, PAF on eliquis, small PE 01/2022, parkinson's disease, H/o GIB, who presented after syncopal episode. Concern for possible GIB while on anticoagulation.   * GI bleed Hgb stable at 8.3 from baseline 11. Has history of prior GIB on eliquis.  No evidence of bleed on EGD. Colonoscopy today  - GI following, appreciate recs  - F/u colonoscopy today  - Continue IV BID PPI 40 mg  - Ferrous sulfate 325 mg daily  - Maintain 2 large bore IVs - Monitor vitals, cardiac telemetry  - AM CBC  Syncope Patient had syncopal episode most likely in the setting of ABLA. Still no clear source of bleeding. Cardiac workup for syncope cause has been unremarkable.  - GI following, appreciate recommendations   - Colonoscopy today  - Cardiology following, appreciate recommendations - Monitor vitals, cardiac telemetry  - AM CBC   Paroxysmal atrial fibrillation (HCC) Recently diagnosed in the last year. Was on Eliquis and metoprolol 12.5 mg BID at home. Followed by [facility]. Should not be on long term anticoagulation given fall risk.  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 Otherwise no complaints.   Objective: Temp:  [97.5 F (36.4 C)-98.8 F (37.1 C)] 98.6 F (37 C) (04/27 0507) Pulse Rate:  [68-75] 73 (04/27 0507) Resp:  [15-19] 18 (04/27 0507) BP: (67-170)/(41-96) 129/71 (04/27 0507) SpO2:  [94 %-100 %] 98 % (04/27 0507) Physical Exam: General: Chronically ill appearing, in no acute distress Cardiovascular: RRR, II/VI flow murmur, cap refill = 3 seconds Respiratory: No increased work of breathing  Abdomen: soft, non distended, non tender to palpation  MSK: Tender to palpation over R hip, no grimace, or guarding   Laboratory: Most recent CBC Lab Results  Component Value Date   WBC 6.6 01/14/2023   HGB 8.3 (L) 01/14/2023   HCT 25.6 (L) 01/14/2023   MCV 74.6 (L) 01/14/2023   PLT 364 01/14/2023   Most recent BMP    Latest Ref Rng & Units 01/14/2023   12:51 AM  BMP  Glucose 70 - 99 mg/dL 90   BUN 8 - 23 mg/dL 7   Creatinine 1.47 - 8.29 mg/dL 5.62   Sodium 130 - 865 mmol/L 141   Potassium 3.5 - 5.1 mmol/L 4.0   Chloride 98 - 111 mmol/L 107   CO2 22 - 32 mmol/L 26   Calcium 8.9 - 10.3 mg/dL 8.6     Endoscopy - Gastric mucosal atrophy, but no evidence of bleeding  Lockie Mola, MD 01/14/2023, 8:56 AM  PGY-1, [facility] FPTS Intern pager: [phone], text pages welcome Secure chat group [facility] Teaching Service

## 2023-01-15 ENCOUNTER — Inpatient Hospital Stay (HOSPITAL_COMMUNITY): Payer: Medicare Other | Admitting: Anesthesiology

## 2023-01-15 ENCOUNTER — Encounter (HOSPITAL_COMMUNITY): Payer: Self-pay | Admitting: Family Medicine

## 2023-01-15 ENCOUNTER — Encounter (HOSPITAL_COMMUNITY)
Admission: EM | Disposition: A | Discharge status: Home / Self Care | Payer: Self-pay | Source: Home / Self Care | Attending: Family Medicine

## 2023-01-15 DIAGNOSIS — D122 Benign neoplasm of ascending colon: Secondary | ICD-10-CM

## 2023-01-15 DIAGNOSIS — I480 Paroxysmal atrial fibrillation: Secondary | ICD-10-CM | POA: Diagnosis not present

## 2023-01-15 DIAGNOSIS — D120 Benign neoplasm of cecum: Secondary | ICD-10-CM

## 2023-01-15 DIAGNOSIS — D126 Benign neoplasm of colon, unspecified: Secondary | ICD-10-CM | POA: Diagnosis not present

## 2023-01-15 DIAGNOSIS — K5730 Diverticulosis of large intestine without perforation or abscess without bleeding: Secondary | ICD-10-CM

## 2023-01-15 DIAGNOSIS — I2510 Atherosclerotic heart disease of native coronary artery without angina pectoris: Secondary | ICD-10-CM | POA: Diagnosis not present

## 2023-01-15 DIAGNOSIS — K648 Other hemorrhoids: Secondary | ICD-10-CM

## 2023-01-15 DIAGNOSIS — Z87891 Personal history of nicotine dependence: Secondary | ICD-10-CM

## 2023-01-15 DIAGNOSIS — I10 Essential (primary) hypertension: Secondary | ICD-10-CM

## 2023-01-15 DIAGNOSIS — I252 Old myocardial infarction: Secondary | ICD-10-CM

## 2023-01-15 HISTORY — PX: GIVENS CAPSULE STUDY: SHX5432

## 2023-01-15 HISTORY — PX: POLYPECTOMY: SHX5525

## 2023-01-15 HISTORY — PX: COLONOSCOPY WITH PROPOFOL: SHX5780

## 2023-01-15 LAB — BASIC METABOLIC PANEL WITH GFR
Anion gap: 8 (ref 5–15)
BUN: 5 mg/dL — ABNORMAL LOW (ref 8–23)
CO2: 24 mmol/L (ref 22–32)
Calcium: 8.5 mg/dL — ABNORMAL LOW (ref 8.9–10.3)
Chloride: 107 mmol/L (ref 98–111)
Creatinine, Ser: 0.73 mg/dL (ref 0.44–1.00)
GFR, Estimated: >60 mL/min
Glucose, Bld: 83 mg/dL (ref 70–99)
Potassium: 3.4 mmol/L — ABNORMAL LOW (ref 3.5–5.1)
Sodium: 139 mmol/L (ref 135–145)

## 2023-01-15 LAB — CBC WITH DIFFERENTIAL/PLATELET
Abs Immature Granulocytes: 0.01 K/uL (ref 0.00–0.07)
Basophils Absolute: 0 K/uL (ref 0.0–0.1)
Basophils Relative: 1 %
Eosinophils Absolute: 0.1 K/uL (ref 0.0–0.5)
Eosinophils Relative: 2 %
HCT: 26.5 % — ABNORMAL LOW (ref 36.0–46.0)
Hemoglobin: 8.6 g/dL — ABNORMAL LOW (ref 12.0–15.0)
Immature Granulocytes: 0 %
Lymphocytes Relative: 21 %
Lymphs Abs: 1.2 K/uL (ref 0.7–4.0)
MCH: 24.2 pg — ABNORMAL LOW (ref 26.0–34.0)
MCHC: 32.5 g/dL (ref 30.0–36.0)
MCV: 74.6 fL — ABNORMAL LOW (ref 80.0–100.0)
Monocytes Absolute: 0.5 K/uL (ref 0.1–1.0)
Monocytes Relative: 8 %
Neutro Abs: 3.9 K/uL (ref 1.7–7.7)
Neutrophils Relative %: 68 %
Platelets: 366 K/uL (ref 150–400)
RBC: 3.55 MIL/uL — ABNORMAL LOW (ref 3.87–5.11)
RDW: 17.2 % — ABNORMAL HIGH (ref 11.5–15.5)
WBC: 5.7 K/uL (ref 4.0–10.5)
nRBC: 0 % (ref 0.0–0.2)

## 2023-01-15 SURGERY — IMAGING PROCEDURE, GI TRACT, INTRALUMINAL, VIA CAPSULE

## 2023-01-15 SURGERY — COLONOSCOPY WITH PROPOFOL
Anesthesia: Monitor Anesthesia Care

## 2023-01-15 MED ORDER — LACTATED RINGERS IV SOLN
INTRAVENOUS | Status: AC | PRN
  Administered 2023-01-15: 10 mL/h via INTRAVENOUS

## 2023-01-15 MED ORDER — PROPOFOL 500 MG/50ML IV EMUL
INTRAVENOUS | Status: DC | PRN
  Administered 2023-01-15: 180 ug/kg/min via INTRAVENOUS

## 2023-01-15 MED ORDER — LIDOCAINE 2% (20 MG/ML) 5 ML SYRINGE
INTRAMUSCULAR | Status: DC | PRN
  Administered 2023-01-15: 40 mg via INTRAVENOUS

## 2023-01-15 MED ORDER — APIXABAN 5 MG PO TABS: 5.0000 mg | ORAL_TABLET | Freq: Two times a day (BID) | ORAL | Status: DC

## 2023-01-15 MED ORDER — PROPOFOL 10 MG/ML IV BOLUS
INTRAVENOUS | Status: DC | PRN
  Administered 2023-01-15: 20 mg via INTRAVENOUS

## 2023-01-15 MED ORDER — SODIUM CHLORIDE 0.9 % IV SOLN: INTRAVENOUS | Status: DC

## 2023-01-15 MED ORDER — PHENYLEPHRINE 80 MCG/ML (10ML) SYRINGE FOR IV PUSH (FOR BLOOD PRESSURE SUPPORT)
PREFILLED_SYRINGE | INTRAVENOUS | Status: DC | PRN
  Administered 2023-01-15 (×2): 80 ug via INTRAVENOUS
  Administered 2023-01-15: 160 ug via INTRAVENOUS
  Administered 2023-01-15 (×2): 80 ug via INTRAVENOUS

## 2023-01-15 MED ORDER — ONDANSETRON HCL 4 MG/2ML IJ SOLN
INTRAMUSCULAR | Status: DC | PRN
  Administered 2023-01-15: 4 mg via INTRAVENOUS

## 2023-01-15 SURGICAL SUPPLY — 22 items

## 2023-01-15 NOTE — Progress Notes (Signed)
Progress Note  Patient Name: Anna Bernard Date of Encounter: 01/15/2023  Primary Cardiologist:   Armanda Magic, MD   Subjective   She denies any acute complaints.  She is not reporting SOB or chest pain.   Inpatient Medications    Scheduled Meds:  Carbidopa-Levodopa ER  2 tablet Oral BID   Carbidopa-Levodopa ER  3 tablet Oral Daily   ferrous sulfate  325 mg Oral Q breakfast   levothyroxine  100 mcg Oral Q0600   metoprolol tartrate  12.5 mg Oral BID   multivitamin with minerals  1 tablet Oral Q breakfast   pantoprazole (PROTONIX) IV  40 mg Intravenous Q12H   Continuous Infusions:  lactated ringers Stopped (01/13/23 1330)   PRN Meds: acetaminophen **OR** acetaminophen, melatonin, ondansetron **OR** ondansetron (ZOFRAN) IV, oxyCODONE, senna-docusate   Vital Signs    Vitals:   01/15/23 1028 01/15/23 1040 01/15/23 1050 01/15/23 1100  BP: (!) 102/50 (!) 99/59 (!) 120/54 135/62  Pulse: 76 83 82 79  Resp: 14 16 16 15   Temp: (!) 97.5 F (36.4 C)     TempSrc: Temporal     SpO2: 99% 100% 98% 97%  Weight:      Height:        Intake/Output Summary (Last 24 hours) at 01/15/2023 1132 Last data filed at 01/15/2023 1024 Gross per 24 hour  Intake 920 ml  Output 1001 ml  Net -81 ml   Filed Weights   01/12/23 1538 01/13/23 0500  Weight: 77.1 kg 74.3 kg    Telemetry    NSR - Personally Reviewed  ECG    NA - Personally Reviewed  Physical Exam   GEN: No  acute distress.   Neck: No  JVD Cardiac: RRR, no murmurs, rubs, or gallops.  Respiratory: Clear   to auscultation bilaterally. GI: Soft, nontender, non-distended, normal bowel sounds  MS:  No edema; No deformity. Neuro:   Nonfocal  Psych: Oriented and appropriate   Labs    Chemistry Recent Labs  Lab 01/12/23 1537 01/13/23 0234 01/14/23 0051 01/15/23 0120  NA 139 139 141 139  K 3.4* 3.4* 4.0 3.4*  CL 107 103 107 107  CO2 25 26 26 24   GLUCOSE 79 79 90 83  BUN 12 7* 7* 5*  CREATININE 0.79 0.73 0.73  0.73  CALCIUM 7.8* 8.4* 8.6* 8.5*  PROT 6.1*  --   --   --   ALBUMIN 2.8*  --   --   --   AST 13*  --   --   --   ALT 5  --   --   --   ALKPHOS 54  --   --   --   BILITOT 0.7  --   --   --   GFRNONAA >60 >60 >60 >60  ANIONGAP 7 10 8 8      Hematology Recent Labs  Lab 01/13/23 0234 01/14/23 0051 01/15/23 0120  WBC 7.1 6.6 5.7  RBC 3.16* 3.43* 3.55*  HGB 7.8* 8.3* 8.6*  HCT 23.2* 25.6* 26.5*  MCV 73.4* 74.6* 74.6*  MCH 24.7* 24.2* 24.2*  MCHC 33.6 32.4 32.5  RDW 17.2* 17.3* 17.2*  PLT 331 364 366    Cardiac EnzymesNo results for input(s): "TROPONINI" in the last 168 hours. No results for input(s): "TROPIPOC" in the last 168 hours.   BNPNo results for input(s): "BNP", "PROBNP" in the last 168 hours.   DDimer No results for input(s): "DDIMER" in the last 168 hours.  Radiology    No results found.  Cardiac Studies   NA  Patient Profile     72 y.o. female with a hx of PAF, HTN, HLD, Parkinson's disease, prediabetes, macrocytic anemia, chronic low back pain and frequent falls who is being seen 01/13/2023 for the evaluation of syncope and watchman procedure at the request of Dr. Julian Reil.   Assessment & Plan    Syncope:  Previous extensive work up.  Likely orthostatic hypotension.  No further work up in patient.   Atrial fib:  Appropriate request to consider Watchman and I have requested referral as an out patient .    Status post colonoscopy this morning and no active source of bleeding.   Capsule endoscopy ongoing.  She has a history of paroxysmal atrial fibrillation.  This patients CHA2DS2-VASc Score and unadjusted Ischemic Stroke Rate (% per year) is equal to 3.2 % stroke rate/year from a score of 3 which necessitates long term oral anticoagulation but she is a poor candidate.  She has a history of PE but I would not classify this as unprovoked.   She would need to be on the Eliquis for a short term surrounding the procedure.  Resume Eliquis at discharge if OK with  primary team and GI.   I spoke with her husband at length about this plan today.   For questions or updates, please contact [facility] Please consult [email or web address] for contact info under Cardiology/STEMI.   Signed, Rollene Rotunda, MD  01/15/2023, 11:32 AM

## 2023-01-15 NOTE — Progress Notes (Signed)
Pt ingested Pill camera at 1100. Instructions reviewed with bedside RN and pt. Instructions sheet sent with pt to bedside.

## 2023-01-15 NOTE — Progress Notes (Signed)
Daily Progress Note Intern Pager: [phone]  Patient name: Anna Bernard Medical record number: [id number] Date of birth: [birth year] Age: 72 y.o. Gender: female  Primary Care Provider: Billey Co, MD Consultants: GI, cardiology Code Status: Full  Pt Overview and Major Events to Date:  4/25 - Admitted and transferred to [facility]   Assessment and Plan: Anna Bernard is a 72 y.o. female with PMH of CAD, PAF on eliquis, small PE 01/2022, parkinson's disease, H/o GIB, who presented after syncopal episode. Concern for possible GIB while on anticoagulation (Eliquis).   * GI bleed Hgb stable at 8.6 from baseline 11. Has history of prior GIB on Eliquis. Luckily, cardiology does not feel she is committed to chronic Eliquis as they suspect her history of PE in May 2023 was provoked by decreased ambulation. No evidence of bleed on EGD. Plan for colonoscopy today. - GI following, appreciate recs  - F/u colonoscopy today  - Continue IV BID PPI 40 mg  - Ferrous sulfate 325 mg daily  - Maintain 2 large bore IVs - Monitor vitals, cardiac telemetry  - AM CBC, transfusion threshold <8  Syncope Patient had syncopal episode most likely in the setting of ABLA. Still no clear source of bleeding. Cardiac workup for syncope cause has been unremarkable.  - GI following, appreciate recommendations   - Colonoscopy today  - Cardiology following, appreciate recommendations - Monitor vitals, cardiac telemetry  - AM CBC   Paroxysmal atrial fibrillation (HCC) Recently diagnosed in the last year. Was on Eliquis and metoprolol 12.5 mg BID at home. Followed by [facility]. Should not be on long term anticoagulation given fall risk.  - Cardiology consulted appreciate recs - Cardiology placed referral for watchman procedure evaluation outpatient - Hold eliquis in setting of possible GI Bleed. Would consider discontinuing in the future. - Continue metoprolol 12.5 BID  - Cardiac telemetry, monitor  vitals   Chronic, stable conditions: Hypothyroidism: Synthroid 100 mcg daily HLD: Crestor 10 mg daily Parkinson disease: Carbidopa-levodopa 25-100 mg 3 times daily  FEN/GI: N.p.o. for colonoscopy PPx: SCDs Dispo: pending clinical improvement . Barriers include ongoing workup.   Subjective:  Tired this morning.  Is not aware of any blood in her stool.  Objective: Temp:  [97.6 F (36.4 C)-98 F (36.7 C)] 98 F (36.7 C) (04/28 0347) Pulse Rate:  [76-80] 77 (04/28 0347) Resp:  [16-18] 16 (04/28 0347) BP: (129-174)/(62-84) 129/62 (04/28 0347) SpO2:  [99 %-100 %] 100 % (04/28 0347) Physical Exam: General: Chronically ill-appearing, NAD, fatigued Cardiovascular: RRR, 3/6 systolic murmur Respiratory: Normal WOB Abdomen: Soft, nondistended, nontender throughout Extremities: No BLE edema  Laboratory: Most recent CBC Lab Results  Component Value Date   WBC 5.7 01/15/2023   HGB 8.6 (L) 01/15/2023   HCT 26.5 (L) 01/15/2023   MCV 74.6 (L) 01/15/2023   PLT 366 01/15/2023   Most recent BMP    Latest Ref Rng & Units 01/15/2023    1:20 AM  BMP  Glucose 70 - 99 mg/dL 83   BUN 8 - 23 mg/dL 5   Creatinine 1.47 - 8.29 mg/dL 5.62   Sodium 130 - 865 mmol/L 139   Potassium 3.5 - 5.1 mmol/L 3.4   Chloride 98 - 111 mmol/L 107   CO2 22 - 32 mmol/L 24   Calcium 8.9 - 10.3 mg/dL 8.5    Imaging/Diagnostic Tests: No recent imaging Shelby Mattocks, DO 01/15/2023, 6:31 AM  PGY-2, [facility] FPTS Intern pager: [phone], text  pages welcome Secure chat group [facility] Teaching Service

## 2023-01-15 NOTE — Anesthesia Preprocedure Evaluation (Addendum)
Anesthesia Evaluation  Patient identified by MRN, date of birth, ID band Patient awake    Reviewed: Allergy & Precautions, NPO status , Patient's Chart, lab work & pertinent test results, reviewed documented beta blocker date and time   Airway Mallampati: III  TM Distance: >3 FB Neck ROM: Full    Dental  (+) Dental Advisory Given, Poor Dentition, Missing   Pulmonary former smoker, PE   Pulmonary exam normal breath sounds clear to auscultation       Cardiovascular hypertension, Pt. 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)  Normal cardiovascular exam+ dysrhythmias Atrial Fibrillation  Rhythm:Regular Rate:Normal     Neuro/Psych Parkinson disease    GI/Hepatic Neg liver ROS,GERD  Medicated,,  Endo/Other  diabetesHypothyroidism  Obesity   Renal/GU negative Renal ROS     Musculoskeletal negative musculoskeletal ROS (+)    Abdominal   Peds  Hematology  (+) Blood dyscrasia (Eliquis), anemia   Anesthesia Other Findings   Reproductive/Obstetrics                             Anesthesia Physical Anesthesia Plan  ASA: 3  Anesthesia Plan: MAC   Post-op Pain Management: Minimal or no pain anticipated   Induction: Intravenous  PONV Risk Score and Plan: 2 and TIVA and Treatment may vary due to age or medical condition  Airway Management Planned: Natural Airway and Simple Face Mask  Additional Equipment:   Intra-op Plan:   Post-operative Plan:   Informed Consent: I have reviewed the patients History and Physical, chart, labs and discussed the procedure including the risks, benefits and alternatives for the proposed anesthesia with the patient or authorized representative who has indicated his/her understanding and acceptance.     Dental advisory given  Plan Discussed with: CRNA  Anesthesia Plan Comments:         Anesthesia Quick Evaluation

## 2023-01-15 NOTE — Anesthesia Postprocedure Evaluation (Signed)
Anesthesia Post Note  Patient: Anna Bernard  Procedure(s) Performed: COLONOSCOPY WITH PROPOFOL POLYPECTOMY GIVENS CAPSULE STUDY     Patient location during evaluation: PACU Anesthesia Type: MAC Level of consciousness: awake and alert Pain management: pain level controlled Vital Signs Assessment: post-procedure vital signs reviewed and stable Respiratory status: spontaneous breathing, nonlabored ventilation, respiratory function stable and patient connected to nasal cannula oxygen Cardiovascular status: stable and blood pressure returned to baseline Postop Assessment: no apparent nausea or vomiting Anesthetic complications: no   No notable events documented.  Last Vitals:  Vitals:   01/15/23 1638 01/15/23 1925  BP: 137/64 124/65  Pulse: 86 83  Resp: 18 17  Temp: 36.7 C 36.7 C  SpO2: 100% 98%    Last Pain:  Vitals:   01/15/23 1944  TempSrc:   PainSc: 0-No pain                 Collene Schlichter

## 2023-01-15 NOTE — Interval H&P Note (Signed)
History and Physical Interval Note:  01/15/2023 9:41 AM  Anna Bernard  has presented today for surgery, with the diagnosis of Anemia and melena.  The various methods of treatment have been discussed with the patient and family. After consideration of risks, benefits and other options for treatment, the patient has consented to  Procedure(s): COLONOSCOPY WITH PROPOFOL (N/A) as a surgical intervention.  The patient's history has been reviewed, patient examined, no change in status, stable for surgery.  I have reviewed the patient's chart and labs.  Questions were answered to the patient's satisfaction.     Tressia Danas

## 2023-01-15 NOTE — Transfer of Care (Signed)
Immediate Anesthesia Transfer of Care Note  Patient: Anna Bernard  Procedure(s) Performed: COLONOSCOPY WITH PROPOFOL POLYPECTOMY  Patient Location: PACU  Anesthesia Type:MAC  Level of Consciousness: sedated  Airway & Oxygen Therapy: Patient Spontanous Breathing and Patient connected to nasal cannula oxygen  Post-op Assessment: Report given to RN and Post -op Vital signs reviewed and stable  Post vital signs: Reviewed and stable  Last Vitals:  Vitals Value Taken Time  BP 102/50 01/15/23 1028  Temp 36.4 C 01/15/23 1028  Pulse 77 01/15/23 1029  Resp 14 01/15/23 1029  SpO2 99 % 01/15/23 1029  Vitals shown include unvalidated device data.  Last Pain:  Vitals:   01/15/23 1028  TempSrc: Temporal  PainSc: Asleep         Complications: No notable events documented.

## 2023-01-15 NOTE — Op Note (Addendum)
[facility] Patient Name: Anna Bernard Procedure Date : 01/15/2023 MRN: [id number] Attending MD: Tressia Danas MD, MD, [id number] Date of Birth: Nov 11, 1950 CSN: [id number] Age: 72 Admit Type: Inpatient Procedure:                Colonoscopy Indications:              Heme positive stool not explained but push                            enteroscopy Providers:                Tressia Danas MD, MD, Margaree Mackintosh, RN,                            Marja Kays, Technician Referring MD:              Medicines:                Monitored Anesthesia Care Complications:            No immediate complications. Estimated Blood Loss:     Estimated blood loss was minimal. Procedure:                Pre-Anesthesia Assessment:                           - Prior to the procedure, a History and Physical                            was performed, and patient medications and                            allergies were reviewed. The patient's tolerance of                            previous anesthesia was also reviewed. The risks                            and benefits of the procedure and the sedation                            options and risks were discussed with the patient.                            All questions were answered, and informed consent                            was obtained. Prior Anticoagulants: The patient has                            taken Eliquis (apixaban), last dose was 3 days                            prior to procedure. ASA Grade Assessment: III - A  patient with severe systemic disease. After                            reviewing the risks and benefits, the patient was                            deemed in satisfactory condition to undergo the                            procedure.                           After obtaining informed consent, the colonoscope                            was passed under direct vision. Throughout the                             procedure, the patient's blood pressure, pulse, and                            oxygen saturations were monitored continuously. The                            CF-HQ190L ([id number]) Olympus coloscope was                            introduced through the anus and advanced to the 5                            cm into the ileum. A second forward view of the                            right colon was performed. The colonoscopy was                            performed without difficulty. The patient tolerated                            the procedure well. The quality of the bowel                            preparation was excellent. The terminal ileum,                            ileocecal valve, appendiceal orifice, and rectum                            were photographed. Scope In: 10:01:34 AM Scope Out: 10:19:39 AM Scope Withdrawal Time: 0 hours 10 minutes 7 seconds  Total Procedure Duration: 0 hours 18 minutes 5 seconds  Findings:      The perianal and digital rectal examinations were normal.      Non-bleeding internal hemorrhoids were found.      Multiple medium-mouthed and small-mouthed  diverticula were found in the       sigmoid colon and descending colon.      Three sessile polyps were found in the cecum. The polyps were less than       1 mm in size. These polyps were removed with a cold snare. Resection and       retrieval were complete. Estimated blood loss was minimal.      The colon (entire examined portion) appeared normal. No other mucosal       abnormalities seen. No AVMs. No blood present.      The terminal ileum appeared normal. No blood present.      The exam was otherwise without abnormality on direct and retroflexion       views. Impression:               - Non-bleeding internal hemorrhoids.                           - Diverticulosis in the sigmoid colon and in the                            descending colon.                           - Three less than  1 mm polyps in the cecum, removed                            with a cold snare. Resected and retrieved.                           - No evidence for active or recent bleeding. Recommendation:           - Return patient to hospital ward for ongoing care.                           - NPO in preparation for capsule endoscopy today.                            Diet advance orders timed after the capsule                            endoscopy and have been entered in EPIC.                           - Continue present medications. Could resume                            Eliquis tomorrow if clinically indicated while we                            complete her GI evaluation.                           - Await pathology results.                           - Continue serial hgb/hct with  transfusion as                            indicated.                           - Capsule endoscopy today.                           - CTA with overt bleeding that occurs prior to                            capsule endoscopy.                           - Consider repeat colonoscopy for colon cancer                            surveillance pased on based pathology.                           - The findings and recommendations were discussed                            with the patient's family.                           Dr. Loreta Ave will resume the patient's GI care tomorrow                            and follow-up on the capsule endoscopy. Procedure Code(s):        --- Professional ---                           [id number], Colonoscopy, flexible; with removal of                            tumor(s), polyp(s), or other lesion(s) by snare                            technique Diagnosis Code(s):        --- Professional ---                           K64.8, Other hemorrhoids                           D12.0, Benign neoplasm of cecum                           R19.5, Other fecal abnormalities                           K57.30, Diverticulosis of  large intestine without                            perforation or abscess without bleeding CPT copyright 2022 American Medical Association.  All rights reserved. The codes documented in this report are preliminary and upon coder review may  be revised to meet current compliance requirements. Tressia Danas MD, MD 01/15/2023 10:31:08 AM This report has been signed electronically. Number of Addenda: 0

## 2023-01-16 ENCOUNTER — Inpatient Hospital Stay: Payer: Medicare Other | Admitting: Family Medicine

## 2023-01-16 ENCOUNTER — Encounter (HOSPITAL_COMMUNITY): Payer: Self-pay | Admitting: Gastroenterology

## 2023-01-16 DIAGNOSIS — I480 Paroxysmal atrial fibrillation: Secondary | ICD-10-CM | POA: Diagnosis not present

## 2023-01-16 LAB — CBC
HCT: 25.1 % — ABNORMAL LOW (ref 36.0–46.0)
Hemoglobin: 8.1 g/dL — ABNORMAL LOW (ref 12.0–15.0)
MCH: 24.2 pg — ABNORMAL LOW (ref 26.0–34.0)
MCHC: 32.3 g/dL (ref 30.0–36.0)
MCV: 74.9 fL — ABNORMAL LOW (ref 80.0–100.0)
Platelets: 358 10*3/uL (ref 150–400)
RBC: 3.35 MIL/uL — ABNORMAL LOW (ref 3.87–5.11)
RDW: 17.5 % — ABNORMAL HIGH (ref 11.5–15.5)
WBC: 6.3 10*3/uL (ref 4.0–10.5)
nRBC: 0 % (ref 0.0–0.2)

## 2023-01-16 NOTE — Progress Notes (Signed)
Progress Note  Patient Name: Anna Bernard Date of Encounter: 01/16/2023  Primary Cardiologist:   Armanda Magic, MD   Subjective   Denies any chest pain or SOB.  No palpitations.   Inpatient Medications    Scheduled Meds:  Carbidopa-Levodopa ER  2 tablet Oral BID   Carbidopa-Levodopa ER  3 tablet Oral Daily   ferrous sulfate  325 mg Oral Q breakfast   levothyroxine  100 mcg Oral Q0600   metoprolol tartrate  12.5 mg Oral BID   multivitamin with minerals  1 tablet Oral Q breakfast   pantoprazole (PROTONIX) IV  40 mg Intravenous Q12H   Continuous Infusions:  lactated ringers Stopped (01/13/23 1330)   PRN Meds: acetaminophen **OR** acetaminophen, melatonin, ondansetron **OR** ondansetron (ZOFRAN) IV, oxyCODONE, senna-docusate   Vital Signs    Vitals:   01/15/23 1638 01/15/23 1925 01/16/23 0443 01/16/23 0903  BP: 137/64 124/65 (!) 145/64 138/71  Pulse: 86 83 68 70  Resp: 18 17 17 18   Temp: 98 F (36.7 C) 98 F (36.7 C) (!) 97.4 F (36.3 C) 98 F (36.7 C)  TempSrc: Oral  Oral Oral  SpO2: 100% 98% 100% 100%  Weight:      Height:        Intake/Output Summary (Last 24 hours) at 01/16/2023 0948 Last data filed at 01/16/2023 0900 Gross per 24 hour  Intake 860 ml  Output 251 ml  Net 609 ml    Filed Weights   01/12/23 1538 01/13/23 0500  Weight: 77.1 kg 74.3 kg    Telemetry    NSR- Personally Reviewed  ECG    NA - Personally Reviewed  Physical Exam   GEN: Well nourished, well developed in no acute distress HEENT: Normal NECK: No JVD; No carotid bruits LYMPHATICS: No lymphadenopathy CARDIAC:RRR, no  rubs, gallops 2/6 SM at LLSB RESPIRATORY:  Clear to auscultation without rales, wheezing or rhonchi  ABDOMEN: Soft, non-tender, non-distended MUSCULOSKELETAL:  No edema; No deformity  SKIN: Warm and dry NEUROLOGIC:  Alert and oriented x 3 PSYCHIATRIC:  Normal affect  Labs    Chemistry Recent Labs  Lab 01/12/23 1537 01/13/23 0234 01/14/23 0051  01/15/23 0120  NA 139 139 141 139  K 3.4* 3.4* 4.0 3.4*  CL 107 103 107 107  CO2 25 26 26 24   GLUCOSE 79 79 90 83  BUN 12 7* 7* 5*  CREATININE 0.79 0.73 0.73 0.73  CALCIUM 7.8* 8.4* 8.6* 8.5*  PROT 6.1*  --   --   --   ALBUMIN 2.8*  --   --   --   AST 13*  --   --   --   ALT 5  --   --   --   ALKPHOS 54  --   --   --   BILITOT 0.7  --   --   --   GFRNONAA >60 >60 >60 >60  ANIONGAP 7 10 8 8       Hematology Recent Labs  Lab 01/14/23 0051 01/15/23 0120 01/16/23 0238  WBC 6.6 5.7 6.3  RBC 3.43* 3.55* 3.35*  HGB 8.3* 8.6* 8.1*  HCT 25.6* 26.5* 25.1*  MCV 74.6* 74.6* 74.9*  MCH 24.2* 24.2* 24.2*  MCHC 32.4 32.5 32.3  RDW 17.3* 17.2* 17.5*  PLT 364 366 358     Cardiac EnzymesNo results for input(s): "TROPONINI" in the last 168 hours. No results for input(s): "TROPIPOC" in the last 168 hours.   BNPNo results for input(s): "BNP", "PROBNP"  in the last 168 hours.   DDimer No results for input(s): "DDIMER" in the last 168 hours.   Radiology    No results found.  Cardiac Studies   NA  Patient Profile     72 y.o. female with a hx of PAF, HTN, HLD, Parkinson's disease, prediabetes, macrocytic anemia, chronic low back pain and frequent falls who is being seen 01/13/2023 for the evaluation of syncope and watchman procedure at the request of Dr. Julian Reil.   Assessment & Plan    Syncope:  Previous extensive work up.  Likely orthostatic hypotension.  No further work up in patient.   Atrial fib:  Appropriate request to consider Watchman and I have requested referral as an out patient .    Status post colonoscopy morning and no active source of bleeding.   Capsule endoscopy ongoing.  She has a history of paroxysmal atrial fibrillation.  This patients CHA2DS2-VASc Score and unadjusted Ischemic Stroke Rate (% per year) is equal to 3.2 % stroke rate/year from a score of 3 which necessitates long term oral anticoagulation but she is a poor candidate.  She has a history of PE but I  would not classify this as unprovoked.   Recommend restarting Eliquis for now until we can get her in with EP outpatient for workup for Watchman LA occluder device.  would need to be on the Eliquis for a short term surrounding the procedure.rge if OK with primary team and GI.     [facility] will sign off.   Medication Recommendations:  Eliquis 5mg  BID if ok with GI and Toprol XL 12.5mg  BID Other recommendations (labs, testing, etc):  none  Follow up as an outpatient:  EP consult for Watchman  For questions or updates, please contact [facility] Please consult [email or web address] for contact info under Cardiology/STEMI.   Signed, Armanda Magic, MD  01/16/2023, 9:48 AM

## 2023-01-16 NOTE — Plan of Care (Signed)
  Problem: Education: Goal: Knowledge of General Education information will improve Description: Including pain rating scale, medication(s)/side effects and non-pharmacologic comfort measures Outcome: Completed/Met   

## 2023-01-16 NOTE — Progress Notes (Signed)
Patient ID: Anna Bernard, female   DOB: 25-Mar-1951, 72 y.o.   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  Therefore she is being scheduled for an enteroscopy tomorrow with ablation of AVMs.  As per my discussion with the family practice teaching service plans are to hold her anticoagulants till the procedure has been done.

## 2023-01-16 NOTE — Discharge Summary (Incomplete)
Family Medicine Teaching [facility] Discharge Summary  Patient name: Anna Bernard Medical record number: [id number] Date of birth: 10/04/1950 Age: 72 y.o. Gender: female Date of Admission: 01/12/2023  Date of Discharge: 01/17/23  Admitting Physician: Nestor Ramp, MD  Primary Care Provider: Billey Co, MD Consultants: Cardiology, Gastroenterology  Indication for Hospitalization: GI Bleed, Syncope  Discharge Diagnoses/Problem List:  Principal Problem for Admission:  Other Problems addressed during stay:  Principal Problem:   GI bleed Active Problems:   Thyroid disease   Essential hypertension   Iron deficiency anemia due to chronic blood loss   Parkinson disease   Paroxysmal atrial fibrillation (HCC)   Pulmonary embolus (HCC)   History of ESBL E. coli infection   Syncope   ABLA (acute blood loss anemia)   Fecal occult blood test positive   Syncope and collapse   Adenomatous polyp of ascending colon    Brief Hospital Course:  Anna Bernard is a 72 y.o. who presented with syncope. Her past medical history is significant for PAF with h/o PE on Eliquis, CAD, HTN, Parkinson disease, and hypothyroidism. Her hospital course is outlined below:  Syncope Anemia Patient presented with recurrent syncopal episodes x2. These were witnessed falls at home in her bathroom. Patient fell on both knees. She has a history of syncope and was admitted to the hospital for the same in 10/2022. Her echo at that time was normal. She was placed on an event monitor after discharge which was likewise normal. She was found to have a GI bleed with acute blood loss anemia that was treated endoscopically prior to discharge from her most recent hospitalization.  Hgb this admission was 8.1. GI and cardiology were consulted. She was started on IV PPI 40mg  BID for possible recurrent GIB. She was continued on home ferrous sulfate 325mg  daily. She had an EGD on 4/26 which showed gastric mucosal atrophy.  Colonoscopy was done on 4/28 with polyp removal x3, non-bleeding internal hemorrhoids, diverticulosis, no blood or AVMs present. Biopsies of the stomach mucosa were negative for high-grade dysplasia or malignancy, tubular adenomas noted. 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  On discharge, her Hgb was stable at 8.3.  Paroxysmal atrial fibrillation H/o PE 01/2022 Patient takes Eliquis and metoprolol 12.5mg  BID at home. The Eliquis was held this admission due to concern for GI bleed and ultimately GI recommended she be discharged without it. Cardiology was consulted. They referred patient for a Watchman procedure, which would likely require her going back on Eliquis surrounding scheduled procedure. She will follow-up outpatient with them.   Chronic conditions:  Parkinson disease continued home carbidopa-levodopa ER 25-100mg  TID Hypothyroidism - Continued home 100 mg synthroid daily HLD - Continued home Crestor 10 mg  Items for PCP Follow-up: 1) Patient needs to have a risk/benefit discussion surrounding if/when to re-start her Eliquis around her Watchman procedure once it is scheduled. Her Eliquis is being held at the time of discharge. 2) Question whether her carbidopa-levodopa may be contributing to her syncopal episodes. May benefit from another medication with a different pharmacokinetic profile such as Rytary. Will need f/u with neurology to discuss.   Disposition: Home  Discharge Condition: Stable   Discharge Exam:  Vitals:   01/17/23 0513 01/17/23 0914  BP: 132/73 128/72  Pulse:  72 77  Resp: 17 18  Temp: 98.4 F (36.9 C) 98 F (36.7 C)  SpO2: 99% 99%  General: Chronically ill-appearing woman, flat affect, no acute distress Cardiovascular: RRR, II/VI flow murmur, cap  refill  <2 seconds Respiratory: Normal work of breathing on room air, clear lung sounds anteriorly Abdomen: Nontender, nondistended, normal active bowel sounds   Significant Procedures:  COLONOSCOPY WITH PROPOFOL POLYPECTOMY GIVENS CAPSULE STUDY  Significant Labs and Imaging:  CBC    Component Value Date/Time   WBC 7.4 01/17/2023 0722   RBC 3.43 (L) 01/17/2023 0722   HGB 8.3 (L) 01/17/2023 0722   HGB 9.6 (L) 11/29/2022 1649   HCT 24.9 (L) 01/17/2023 0722   HCT 30.5 (L) 11/29/2022 1649   PLT 361 01/17/2023 0722   PLT 348 11/29/2022 1649   MCV 72.6 (L) 01/17/2023 0722   MCV 79 11/29/2022 1649   MCH 24.2 (L) 01/17/2023 0722   MCHC 33.3 01/17/2023 0722   RDW 17.4 (H) 01/17/2023 0722   RDW 19.1 (H) 11/29/2022 1649   LYMPHSABS 1.2 01/15/2023 0120   LYMPHSABS 1.5 09/10/2020 1142   MONOABS 0.5 01/15/2023 0120   EOSABS 0.1 01/15/2023 0120   EOSABS 0.1 09/10/2020 1142   BASOSABS 0.0 01/15/2023 0120   BASOSABS 0.0 09/10/2020 1142   CMP     Component Value Date/Time   NA 139 01/15/2023 0120   NA 142 11/29/2022 1649   K 3.4 (L) 01/15/2023 0120   CL 107 01/15/2023 0120   CO2 24 01/15/2023 0120   GLUCOSE 83 01/15/2023 0120   BUN 5 (L) 01/15/2023 0120   BUN 15 11/29/2022 1649   CREATININE 0.73 01/15/2023 0120   CREATININE 0.52 04/21/2013 1526   CALCIUM 8.5 (L) 01/15/2023 0120   PROT 6.1 (L) 01/12/2023 1537   PROT 6.8 11/14/2022 1411   ALBUMIN 2.8 (L) 01/12/2023 1537   ALBUMIN 3.8 (L) 11/14/2022 1411   AST 13 (L) 01/12/2023 1537   ALT 5 01/12/2023 1537   ALKPHOS 54 01/12/2023 1537   BILITOT 0.7 01/12/2023 1537   BILITOT 0.2 11/14/2022 1411   GFRNONAA >60 01/15/2023 0120   GFRAA 71 03/30/2020 1217     Results/Tests Pending at Time of Discharge: None  Discharge Medications:  Allergies as of 01/17/2023   No Known Allergies      Medication List     STOP taking these medications    Eliquis 5 MG Tabs tablet Generic drug: apixaban       TAKE these medications     Carbidopa-Levodopa ER 25-100 MG tablet controlled release Commonly known as: SINEMET CR NEW PRESCRIPTION REQUEST: TAKE THREE TABLETS BY MOUTH AT 7:00AM and TAKE TWO TABLETS BY MOUTH AT 11:00AM and TAKE TWO TABLETS BY MOUTH AT 4:00PM What changed:  how much to take when to take this additional instructions   diclofenac Sodium 1 % Gel Commonly known as: VOLTAREN NEW PRESCRIPTION REQUEST: APPLY TWO GRAM TOPICALLY TO FEET TWICE DAILY   ferrous sulfate 325 (65 FE) MG tablet TAKE 1 TABLET BY MOUTH EVERY DAY WITH BREAKFAST   levothyroxine 100 MCG tablet Commonly known as: SYNTHROID NEW PRESCRIPTION REQUEST: TAKE ONE TABLET BY MOUTH DAILY What changed: See the new instructions.   lidocaine 4 % Place 1 patch onto the skin daily.   melatonin 3 MG Tabs tablet Take 3 mg by mouth at bedtime as needed (For sleep).   metoprolol tartrate 25 MG tablet Commonly known as: LOPRESSOR NEW PRESCRIPTION REQUEST: TAKE 1/2 TABLET BY MOUTH TWICE DAILY What  changed: See the new instructions.   multivitamin with minerals Tabs tablet Take 1 tablet by mouth daily with breakfast.   oxyCODONE 5 MG immediate release tablet Commonly known as: Oxy IR/ROXICODONE Take 1 tablet (5 mg total) by mouth every 4 (four) hours as needed for severe pain. What changed:  when to take this reasons to take this   pantoprazole 40 MG tablet Commonly known as: PROTONIX NEW PRESCRIPTION REQUEST: TAKE ONE TABLET BY MOUTH TWICE DAILY What changed: See the new instructions.   polyethylene glycol 17 g packet Commonly known as: MIRALAX / GLYCOLAX Take 17 g by mouth 2 (two) times daily.   rosuvastatin 10 MG tablet Commonly known as: CRESTOR Take 1 tablet (10 mg total) by mouth every evening.   senna-docusate 8.6-50 MG tablet Commonly known as: Senokot-S Take 1 tablet by mouth at bedtime. Hold if diarrhea What changed:  when to take this reasons to take this additional instructions   Tylenol 8 Hour Arthritis  Pain 650 MG CR tablet Generic drug: acetaminophen Take 650-1,300 mg by mouth every 8 (eight) hours as needed for pain.       Discharge Instructions: Please refer to Patient Instructions section of EMR for full details.  Patient was counseled important signs and symptoms that should prompt return to medical care, changes in medications, dietary instructions, activity restrictions, and follow up appointments.   Follow-Up Appointments: Future Appointments  Date Time Provider Department Center  01/23/2023 10:50 AM Billey Co, MD [facility]  01/31/2023  1:10 PM GI-[facility] MM 2 GI-[facility]  02/03/2023 10:30 AM Gaston Islam., NP [facility]  02/14/2023 11:15 AM Tat, Octaviano Batty, DO [facility] None    Follow-up Information     Quintella Reichert, MD Follow up on 02/03/2023.   Specialty: Cardiology Why: at 10:30 AM with her NP Robin Searing Contact information: [address] [place] [address] [phone]                 Lorri Frederick, MD 01/17/2023, 1:42 PM PGY-1, [facility]    I have evaluated this patient along with Dr. Theodis Aguas and reviewed the above note, making necessary revisions.  Dorothyann Gibbs, MD 01/17/2023, 1:58 PM PGY-2, [facility]

## 2023-01-16 NOTE — Discharge Instructions (Signed)
We will have you seen by our electrophysiologist for procedure evaluation.  This would help you come off anticoagulation.   They should call you.  Their office is in same building as Dr. Mayford Knife.

## 2023-01-16 NOTE — Progress Notes (Signed)
     Daily Progress Note Intern Pager: [phone]  Patient name: Anna Bernard Medical record number: [id number] Date of birth: Nov 19, 1950 Age: 72 y.o. Gender: female  Primary Care Provider: Billey Co, MD Consultants: GI, cardiology Code Status: Full  Pt Overview and Major Events to Date:  4/25 - Admitted and transferred to [facility]   Assessment and Plan: Anna Bernard is a 72 y.o. female with PMH of CAD, PAF on eliquis, small PE 01/2022, parkinson's disease, H/o GIB, who presented after syncopal episode. Concern for possible GIB while on anticoagulation (Eliquis).   * GI bleed Hgb approaching threshold, with modest decline today 8.6 > 8.1. Has history of prior GIB on Eliquis, which has been held during this hospitalization.  Colonoscopy findings showed no evidence of active or recent bleeding, currently awaiting repeat capsule endoscopy results.  - GI following, appreciate recs  - F/u capsule endoscopy results - Continue IV BID PPI 40 mg  - Ferrous sulfate 325 mg daily  - Maintain 2 large bore IVs - Monitor vitals, cardiac telemetry  - AM CBC, transfusion threshold <8  Syncope Capsule endoscopy repeated after negative colonoscopy findings. Syncope workup thus far unremarkable, cardiology believes it is likely orthostatic hypotension.  - GI following, appreciate recommendations   - Colonoscopy today  - Cardiology following, appreciate recommendations - Monitor vitals, cardiac telemetry  - AM CBC   Paroxysmal atrial fibrillation (HCC) Recently diagnosed in the last year. Was on Eliquis and metoprolol 12.5 mg BID at home. Followed by [facility] Cardiology. Should not be on long term anticoagulation given fall risk.  Cardiology recommending short-term Eliquis, resumed at discharge and arranging outpatient referral for Watchman device. - Cardiology placed referral for watchman procedure evaluation outpatient - Hold eliquis in setting of possible GI Bleed.  - Continue metoprolol  12.5 BID  - Cardiac telemetry, monitor vitals   Chronic, stable conditions: Hypothyroidism: Synthroid 100 mcg daily HLD: Crestor 10 mg daily Parkinson disease: Carbidopa-levodopa 25-100 mg 3 times daily  FEN/GI: Regular PPx: SCDs Dispo: pending clinical improvement . Barriers include ongoing workup.   Subjective:  No acute concerns.  Objective: Temp:  [97.4 F (36.3 C)-98.3 F (36.8 C)] 98 F (36.7 C) (04/29 0903) Pulse Rate:  [68-86] 70 (04/29 0903) Resp:  [17-18] 18 (04/29 0903) BP: (124-145)/(64-71) 138/71 (04/29 0903) SpO2:  [98 %-100 %] 100 % (04/29 0903) Physical Exam: General: Chronically ill-appearing, NAD, fatigued  Cardiovascular: RRR, 3/6 systolic murmur Respiratory: Normal WOB Abdomen: Soft, nondistended, nontender throughout Extremities: No BLE edema  Laboratory: Most recent CBC Lab Results  Component Value Date   WBC 6.3 01/16/2023   HGB 8.1 (L) 01/16/2023   HCT 25.1 (L) 01/16/2023   MCV 74.9 (L) 01/16/2023   PLT 358 01/16/2023   Most recent BMP    Latest Ref Rng & Units 01/15/2023    1:20 AM  BMP  Glucose 70 - 99 mg/dL 83   BUN 8 - 23 mg/dL 5   Creatinine 9.56 - 2.13 mg/dL 0.86   Sodium 578 - 469 mmol/L 139   Potassium 3.5 - 5.1 mmol/L 3.4   Chloride 98 - 111 mmol/L 107   CO2 22 - 32 mmol/L 24   Calcium 8.9 - 10.3 mg/dL 8.5    Imaging/Diagnostic Tests: No recent imaging Lorri Frederick, MD 01/16/2023, 2:50 PM PGY-1, [place] [facility] FPTS Intern pager: [phone], text pages welcome Secure chat group [facility] Teaching Service

## 2023-01-17 ENCOUNTER — Other Ambulatory Visit (HOSPITAL_COMMUNITY): Payer: Self-pay

## 2023-01-17 LAB — CBC
HCT: 24.9 % — ABNORMAL LOW (ref 36.0–46.0)
Hemoglobin: 8.3 g/dL — ABNORMAL LOW (ref 12.0–15.0)
MCH: 24.2 pg — ABNORMAL LOW (ref 26.0–34.0)
MCHC: 33.3 g/dL (ref 30.0–36.0)
MCV: 72.6 fL — ABNORMAL LOW (ref 80.0–100.0)
Platelets: 361 10*3/uL (ref 150–400)
RBC: 3.43 MIL/uL — ABNORMAL LOW (ref 3.87–5.11)
RDW: 17.4 % — ABNORMAL HIGH (ref 11.5–15.5)
WBC: 7.4 10*3/uL (ref 4.0–10.5)
nRBC: 0 % (ref 0.0–0.2)

## 2023-01-17 LAB — SURGICAL PATHOLOGY

## 2023-01-17 SURGERY — ENTEROSCOPY
Anesthesia: Monitor Anesthesia Care

## 2023-01-17 MED ORDER — OXYCODONE HCL 5 MG PO TABS
5.0000 mg | ORAL_TABLET | ORAL | 0 refills | Status: DC | PRN
  Filled 2023-01-17: qty 30, 5d supply, fill #0

## 2023-01-17 NOTE — Anesthesia Postprocedure Evaluation (Signed)
Anesthesia Post Note  Patient: Anna Bernard  Procedure(s) Performed: ENTEROSCOPY BIOPSY     Patient location during evaluation: Endoscopy Anesthesia Type: MAC Level of consciousness: awake and alert Pain management: pain level controlled Vital Signs Assessment: post-procedure vital signs reviewed and stable Respiratory status: spontaneous breathing, nonlabored ventilation, respiratory function stable and patient connected to nasal cannula oxygen Cardiovascular status: stable and blood pressure returned to baseline Postop Assessment: no apparent nausea or vomiting Anesthetic complications: no   No notable events documented.  Last Vitals:  Vitals:   01/17/23 0513 01/17/23 0914  BP: 132/73 128/72  Pulse: 72 77  Resp: 17 18  Temp: 36.9 C 36.7 C  SpO2: 99% 99%    Last Pain:  Vitals:   01/17/23 1303  TempSrc:   PainSc: 5                  Pari Lombard

## 2023-01-17 NOTE — Progress Notes (Addendum)
I spoke with Dr. Loreta Ave on the phone this morning who is no longer recommending small bowel enteroscopy today as pt had one on 4/26. Ultimately Dr. Loreta Ave recommended limiting anticoagulation d/t concern for AVMs. Per Dr. Loreta Ave, Dr. Elnoria Howard will see patient today.

## 2023-01-17 NOTE — Plan of Care (Signed)
  Problem: Health Behavior/Discharge Planning: Goal: Ability to manage health-related needs will improve Outcome: Adequate for Discharge   Problem: Clinical Measurements: Goal: Ability to maintain clinical measurements within normal limits will improve Outcome: Adequate for Discharge Goal: Will remain free from infection Outcome: Adequate for Discharge Goal: Diagnostic test results will improve Outcome: Adequate for Discharge Goal: Respiratory complications will improve Outcome: Adequate for Discharge Goal: Cardiovascular complication will be avoided Outcome: Adequate for Discharge   Problem: Activity: Goal: Risk for activity intolerance will decrease Outcome: Adequate for Discharge   Problem: Nutrition: Goal: Adequate nutrition will be maintained Outcome: Adequate for Discharge   Problem: Coping: Goal: Level of anxiety will decrease Outcome: Adequate for Discharge   Problem: Elimination: Goal: Will not experience complications related to bowel motility Outcome: Adequate for Discharge Goal: Will not experience complications related to urinary retention Outcome: Adequate for Discharge   Problem: Pain Managment: Goal: General experience of comfort will improve Outcome: Adequate for Discharge   Problem: Safety: Goal: Ability to remain free from injury will improve Outcome: Adequate for Discharge   Problem: Skin Integrity: Goal: Risk for impaired skin integrity will decrease Outcome: Adequate for Discharge   Problem: Education: Goal: Ability to identify signs and symptoms of gastrointestinal bleeding will improve Outcome: Adequate for Discharge   Problem: Bowel/Gastric: Goal: Will show no signs and symptoms of gastrointestinal bleeding Outcome: Adequate for Discharge   Problem: Fluid Volume: Goal: Will show no signs and symptoms of excessive bleeding Outcome: Adequate for Discharge   Problem: Clinical Measurements: Goal: Complications related to the disease  process, condition or treatment will be avoided or minimized Outcome: Adequate for Discharge   

## 2023-01-17 NOTE — TOC Transition Note (Signed)
Transition of Care [facility]) - CM/SW Discharge Note   Patient Details  Name: Anna Bernard MRN: [id number] Date of Birth: Jan 27, 1951  Transition of Care [facility]) CM/SW Contact:  Tom-Johnson, Hershal Coria, RN Phone Number: 01/17/2023, 1:49 PM   Clinical Narrative:     Patient is scheduled for discharge today.  Readmission Prevention Assessment done.  Hospital f/u and discharge instructions on AVS.  Prescription sent to [facility] pharmacy and meds to be delivered to patient at bedside prior to discharge.  Husband, [name] and daughter, Sarita Haver at bedside and will transport at discharge.  No further TOC needs noted.          Final next level of care: Home/Self Care Barriers to Discharge: Barriers Resolved   Patient Goals and CMS Choice CMS Medicare.gov Compare Post Acute Care list provided to:: Patient Choice offered to / list presented to : NA  Discharge Placement                  Patient to be transferred to facility by: Husband Name of family member notified: [facility]    Discharge Plan and Services Additional resources added to the After Visit Summary for                  DME Arranged: N/A DME Agency: NA       HH Arranged: NA HH Agency: NA        Social Determinants of Health (SDOH) Interventions SDOH Screenings   Food Insecurity: No Food Insecurity (01/13/2023)  Housing: Low Risk  (01/13/2023)  Transportation Needs: No Transportation Needs (01/13/2023)  Utilities: At Risk (01/13/2023)  Alcohol Screen: Low Risk  (01/05/2023)  Depression (PHQ2-9): Low Risk  (01/05/2023)  Financial Resource Strain: Low Risk  (01/05/2023)  Physical Activity: Inactive (01/05/2023)  Social Connections: Moderately Isolated (01/05/2023)  Stress: No Stress Concern Present (01/05/2023)  Tobacco Use: Medium Risk (01/16/2023)     Readmission Risk Interventions    01/17/2023    1:37 PM  Readmission Risk Prevention Plan  Transportation Screening Complete  PCP or Specialist Appt within  5-7 Days Complete  Home Care Screening Complete  Medication Review (RN CM) Referral to Pharmacy

## 2023-01-17 NOTE — Progress Notes (Signed)
DISCHARGE NOTE HOME [name] to be discharged Home per MD order. Discussed prescriptions and follow up appointments with the patient. Prescriptions given to patient; medication list explained in detail. Patient verbalized understanding.  Skin clean, dry and intact without evidence of skin break down, no evidence of skin tears noted. IV catheter discontinued intact. Site without signs and symptoms of complications. Dressing and pressure applied. Pt denies pain at the site currently. No complaints noted.  Patient free of lines, drains, and wounds.   An After Visit Summary (AVS) was printed and given to the patient. Patient escorted via wheelchair, and discharged home via private auto.  Oda Cogan, LPN

## 2023-01-18 ENCOUNTER — Telehealth: Payer: Self-pay

## 2023-01-18 NOTE — Transitions of Care (Post Inpatient/ED Visit) (Signed)
   01/18/2023  Name: Anna Bernard MRN: [id number] DOB: [birth year]  Today's TOC FU Call Status: Today's TOC FU Call Status:: Unsuccessul Call (1st Attempt) Unsuccessful Call (1st Attempt) Date: 01/18/23  Attempted to reach the patient regarding the most recent Inpatient/ED visit.  Follow Up Plan: Additional outreach attempts will be made to reach the patient to complete the Transitions of Care (Post Inpatient/ED visit) call.   Jodelle Gross, RN, BSN, CCM Care Management Coordinator [place] Phone: [phone]/Fax: [phone]

## 2023-01-19 ENCOUNTER — Telehealth: Payer: Self-pay

## 2023-01-19 ENCOUNTER — Telehealth: Payer: Self-pay | Admitting: *Deleted

## 2023-01-19 LAB — SURGICAL PATHOLOGY

## 2023-01-19 NOTE — Telephone Encounter (Signed)
Called patient and spoke with her and her daughter about recent heart monitor and lab results. Patient and daughter report that patient has not had any episodes of chest pain, palpitations, weakness or SOB. However, patient was admitted to [facility] 01/12/23-01/16/33 for a GI bleed. She was taken off eliquis at discharge and may be having a Watchman procedure. They state their primary concern is navigating this current condition, responses forwarded to Dr. Mayford Knife.

## 2023-01-19 NOTE — Telephone Encounter (Signed)
Per Nada Boozer and Dr. Mayford Knife, called to arrange Watchman consult.  Spoke with the patient and her daughter at length.  The patient's husband was admitted to the hospital the day after Ms. Tsutsui was discharged and is not doing well. He is scheduled for surgery himself 5/30.  Scheduled Ms. Guirguis for [facility] consult 6/7. They will call if the appointment needs to be rescheduled. They were grateful for call and agreed with plan.

## 2023-01-19 NOTE — Telephone Encounter (Signed)
-----   Message from Traci R Turner, MD sent at 12/20/2022  3:08 PM EDT ----- Heart monitor showed normal rhythm except for 1 episode of a 5 beat run of extra heartbeats from the bottom of the heart.  Please find out if she had any symptoms while she was wearing heart monitor.  If she is asymptomatic then continue metoprolol.  Recent potassium was normal at 4.8 and mag was 2.2.  And normal TSH 

## 2023-01-19 NOTE — Transitions of Care (Post Inpatient/ED Visit) (Signed)
01/19/2023  Name: Anna Bernard MRN: [id number] DOB: 1951/04/17  Today's TOC FU Call Status: Today's TOC FU Call Status:: Successful TOC FU Call Competed TOC FU Call Complete Date: 01/19/23  Transition Care Management Follow-up Telephone Call Date of Discharge: 01/17/23 Discharge Facility: [facility]) Type of Discharge: Inpatient Admission Primary Inpatient Discharge Diagnosis:: GI Bleed How have you been since you were released from the hospital?: Better Any questions or concerns?: No  Items Reviewed: Did you receive and understand the discharge instructions provided?: Yes Medications obtained,verified, and reconciled?: Yes (Medications Reviewed) Any new allergies since your discharge?: No Dietary orders reviewed?: No Do you have support at home?: Yes People in Home: spouse Name of Support/Comfort Primary Source: [name]  Medications Reviewed Today: Medications Reviewed Today     Reviewed by Luella Cook, RN (Case Manager) on 01/19/23 at 1421  Med List Status: <None>   Medication Order Taking? Sig Documenting Provider Last Dose Status Informant  Carbidopa-Levodopa ER (SINEMET CR) 25-100 MG tablet controlled release [id number] Yes NEW PRESCRIPTION REQUEST: TAKE THREE TABLETS BY MOUTH AT 7:00AM and TAKE TWO TABLETS BY MOUTH AT 11:00AM and TAKE TWO TABLETS BY MOUTH AT 4:00PM  Patient taking differently: 2-3 tablets See admin instructions. Take 3 tablets by mouth at 7 AM, 2 tablets at 11 AM, and 2 tablets at 4 PM   Pray, Milus Mallick, MD Taking Active Spouse/Significant Other  diclofenac Sodium (VOLTAREN) 1 % GEL [id number]  NEW PRESCRIPTION REQUEST: APPLY TWO GRAM TOPICALLY TO FEET TWICE DAILY  Patient not taking: Reported on 01/12/2023   Billey Co, MD  Active Spouse/Significant Other  ferrous sulfate 325 (65 FE) MG tablet [id number]  TAKE 1 TABLET BY MOUTH EVERY DAY WITH BREAKFAST  Patient not taking: Reported on 01/12/2023   Billey Co, MD  Active  Spouse/Significant Other  levothyroxine (SYNTHROID) 100 MCG tablet [id number]  NEW PRESCRIPTION REQUEST: TAKE ONE TABLET BY MOUTH DAILY  Patient taking differently: Take 100 mcg by mouth daily before breakfast.   Billey Co, MD  Active Spouse/Significant Other  lidocaine 4 % [id number]  Place 1 patch onto the skin daily.  Patient not taking: Reported on 01/12/2023   Billey Co, MD  Active Spouse/Significant Other  melatonin 3 MG TABS tablet [id number] Yes Take 3 mg by mouth at bedtime as needed (For sleep). [provider] Taking Active Spouse/Significant Other  metoprolol tartrate (LOPRESSOR) 25 MG tablet [id number]  NEW PRESCRIPTION REQUEST: TAKE 1/2 TABLET BY MOUTH TWICE DAILY  Patient taking differently: Take 12.5 mg by mouth 2 (two) times daily.   Billey Co, MD  Active Spouse/Significant Other  Multiple Vitamin (MULTIVITAMIN WITH MINERALS) TABS [id number] Yes Take 1 tablet by mouth daily with breakfast. [provider] Taking Active Spouse/Significant Other  oxyCODONE (OXY IR/ROXICODONE) 5 MG immediate release tablet [id number] Yes Take 1 tablet (5 mg total) by mouth every 4 (four) hours as needed for severe pain. Carrion-Carrero, Karle Starch, MD Taking Active   pantoprazole (PROTONIX) 40 MG tablet [id number]  NEW PRESCRIPTION REQUEST: TAKE ONE TABLET BY MOUTH TWICE DAILY  Patient taking differently: Take 40 mg by mouth 2 (two) times daily before a meal.   Pray, Milus Mallick, MD  Active Spouse/Significant Other  polyethylene glycol (MIRALAX / GLYCOLAX) 17 g packet [id number]  Take 17 g by mouth 2 (two) times daily.  Patient not taking: Reported on 01/12/2023   Albertine Grates, MD  Active Spouse/Significant Other  rosuvastatin (CRESTOR) 10 MG tablet [id number]  Take 1  tablet (10 mg total) by mouth every evening.  Patient not taking: Reported on 01/12/2023   Levi Aland, NP  Active Spouse/Significant Other  senna-docusate (SENOKOT-S) 8.6-50 MG tablet [id number]  Take 1  tablet by mouth at bedtime. Hold if diarrhea  Patient taking differently: Take 1 tablet by mouth at bedtime as needed for mild constipation (hold for diarrhea).   Albertine Grates, MD  Active Spouse/Significant Other  TYLENOL 8 HOUR ARTHRITIS PAIN 650 MG CR tablet [id number] Yes Take 650-1,300 mg by mouth every 8 (eight) hours as needed for pain. [provider] Taking Active Spouse/Significant Other            Home Care and Equipment/Supplies: Were Home Health Services Ordered?: No Any new equipment or medical supplies ordered?: No  Functional Questionnaire: Do you need assistance with bathing/showering or dressing?: Yes Do you need assistance with meal preparation?: Yes Do you need assistance with eating?: No Do you have difficulty maintaining continence: No Do you need assistance with getting out of bed/getting out of a chair/moving?: No Do you have difficulty managing or taking your medications?: No  Follow up appointments reviewed: PCP Follow-up appointment confirmed?: Yes Date of PCP follow-up appointment?: 01/23/23 Follow-up Provider: DR [facility] Follow-up appointment confirmed?: Yes Date of Specialist follow-up appointment?: 02/03/23 Follow-Up Specialty Provider:: [facility] Do you need transportation to your follow-up appointment?: No Do you understand care options if your condition(s) worsen?: Yes-patient verbalized understanding  SDOH Interventions Today    Flowsheet Row Most Recent Value  SDOH Interventions   Food Insecurity Interventions Intervention Not Indicated  Housing Interventions Intervention Not Indicated  Transportation Interventions Intervention Not Indicated       S.thni TOC Interventions Today    Flowsheet Row Most Recent Value  TOC Interventions   TOC Interventions Discussed/Reviewed TOC Interventions Discussed, TOC Interventions Reviewed      Gean Maidens BSN RN [facility] [phone]

## 2023-01-20 NOTE — Progress Notes (Unsigned)
    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 She is to follow up with cardiology oupatient to consider Watchman procedure for her Afib as she is a poor anticoagulation candidate. Need to discuss how she would feel about restarting her Eliquis. Also recommended neurology follow up as carbadopa-levodopa may be contributing to her syncope.   PERTINENT  PMH / PSH: ***  OBJECTIVE:   There were no vitals taken for this visit.  General: A&O, NAD HEENT: No sign of trauma, EOM grossly intact Cardiac: RRR, no m/r/g Respiratory: CTAB, normal WOB, no w/c/r GI: Soft, NTTP, non-distended  Extremities: NTTP, no peripheral edema. Neuro: Normal gait, moves all four extremities appropriately. Psych: Appropriate mood and affect   ASSESSMENT/PLAN:   No problem-specific Assessment & Plan notes found for this encounter.     Billey Co, MD [facility]

## 2023-01-22 ENCOUNTER — Other Ambulatory Visit: Payer: Self-pay | Admitting: Neurology

## 2023-01-22 DIAGNOSIS — G214 Vascular parkinsonism: Secondary | ICD-10-CM

## 2023-01-23 ENCOUNTER — Encounter: Payer: Self-pay | Admitting: Family Medicine

## 2023-01-23 ENCOUNTER — Ambulatory Visit (INDEPENDENT_AMBULATORY_CARE_PROVIDER_SITE_OTHER): Payer: Medicare Other | Admitting: Family Medicine

## 2023-01-23 ENCOUNTER — Telehealth: Payer: Self-pay

## 2023-01-23 VITALS — BP 120/70 | HR 74 | Ht 61.0 in

## 2023-01-23 DIAGNOSIS — D62 Acute posthemorrhagic anemia: Secondary | ICD-10-CM

## 2023-01-23 DIAGNOSIS — Z7189 Other specified counseling: Secondary | ICD-10-CM | POA: Insufficient documentation

## 2023-01-23 DIAGNOSIS — K922 Gastrointestinal hemorrhage, unspecified: Secondary | ICD-10-CM

## 2023-01-23 LAB — POCT HEMOGLOBIN: Hemoglobin: 9.2 g/dL — AB (ref 11–14.6)

## 2023-01-23 NOTE — Assessment & Plan Note (Signed)
Hgb stable today, continue PO ferrous sulfate and continue to hold Eliquis since hospital discharge Has f/u with cardiology in 2 weeks, continue PPI We will continue to engage in goals of care discussions after cardiology appt pending whether a short period of Eliquis around her procedure is a risk she wants to take versus deciding to forgo anticoagulation indefinitely For now in agreement to hold despite the risk of stroke as she has just been discharged from second GI bleed while on Eliquis

## 2023-01-23 NOTE — Patient Instructions (Signed)
It was wonderful to see you today.  Please bring ALL of your medications with you to every visit.   Today we talked about:  Your Hgb is getting better, keeping taking daily iron (ferrous sulfate).  We placed a referral to palliative care services, they will be calling you. Let's still go to the cardiology appt to see what they are thinking before we decide more. Continue to hold your Eliquis.  We will call you about a follow up appt for your skin.  Thank you for choosing [facility].   Please call [phone] with any questions about today's appointment.  Please arrive at least 15 minutes prior to your scheduled appointments.   If you had blood work today, I will send you a MyChart message or a letter if results are normal. Otherwise, I will give you a call.   If you had a referral placed, they will call you to set up an appointment. Please give [place] a call if you don't hear back in the next 2 weeks.   If you need additional refills before your next appointment, please call your pharmacy first.   Burley Saver, MD  Family Medicine

## 2023-01-23 NOTE — Telephone Encounter (Signed)
Per [employer] review of May 2023 CT:

## 2023-01-23 NOTE — Assessment & Plan Note (Signed)
Introduced Fish farm manager of palliative care today to Anna Bernard and her husband and daughter. We discussed all of their concern that while anticoagulation prevents stroke, she continues to have bleeding, which ends up with her having syncope and in the hospital. I introduced the concept of palliative care as focusing on quality of life, treating symptoms, and minimizing unnecessary medications and procedures. She listened actively and after our discussion noted she would like to try this. Husband and daughter note she is tired of going in and out of the hospital. Anna Bernard listened actively, and answered all questions. Ambulatory referral placed. Husband and daughter supportive. They would also like to keep appt with cardiology to discuss Watchman procedure and what anticoagulation would be needed as well as with neurologist. They are ok with me reaching out to her neurologist to discuss issues with her medications. Holding Eliquis for now pending conversation with cardiology.

## 2023-01-24 ENCOUNTER — Telehealth: Payer: Self-pay

## 2023-01-24 DIAGNOSIS — H26491 Other secondary cataract, right eye: Secondary | ICD-10-CM | POA: Diagnosis not present

## 2023-01-24 DIAGNOSIS — I480 Paroxysmal atrial fibrillation: Secondary | ICD-10-CM

## 2023-01-24 NOTE — Telephone Encounter (Signed)
-----   Message from Corrin Parker, [place] sent at 01/20/2023  7:54 PM EDT ----- Regarding: EP Referral Burley Saver,  Dr. Mayford Knife saw this patient earlier this week in the hospital and recommended an outpatient EP referral for consideration of Watchman. I did not personally see the patient but Dr. Mayford Knife asked if I could help arrange. Can you please help me with this?  Thank you so much! Callie

## 2023-01-24 NOTE — Telephone Encounter (Signed)
Those follow up visits are good and he should keep those but can we go ahead and place the EP referral per Dr. Norris Cross rounding note on 01/16/2023.   Thanks so much!

## 2023-01-25 DIAGNOSIS — M205X1 Other deformities of toe(s) (acquired), right foot: Secondary | ICD-10-CM | POA: Diagnosis not present

## 2023-01-25 NOTE — Telephone Encounter (Signed)
Referral to EP entered. 

## 2023-01-25 NOTE — Addendum Note (Signed)
Addended by: Alyson Ingles on: 01/25/2023 07:44 AM   Modules accepted: Orders

## 2023-01-30 DIAGNOSIS — H3561 Retinal hemorrhage, right eye: Secondary | ICD-10-CM | POA: Diagnosis not present

## 2023-01-30 DIAGNOSIS — H3582 Retinal ischemia: Secondary | ICD-10-CM | POA: Diagnosis not present

## 2023-01-30 DIAGNOSIS — H34831 Tributary (branch) retinal vein occlusion, right eye, with macular edema: Secondary | ICD-10-CM | POA: Diagnosis not present

## 2023-01-30 DIAGNOSIS — H35371 Puckering of macula, right eye: Secondary | ICD-10-CM | POA: Diagnosis not present

## 2023-01-30 DIAGNOSIS — H35032 Hypertensive retinopathy, left eye: Secondary | ICD-10-CM | POA: Diagnosis not present

## 2023-01-31 ENCOUNTER — Ambulatory Visit
Admission: RE | Admit: 2023-01-31 | Discharge: 2023-01-31 | Disposition: A | Discharge status: Home / Self Care | Payer: Medicare Other | Source: Ambulatory Visit | Attending: Family Medicine | Admitting: Family Medicine

## 2023-01-31 DIAGNOSIS — Z1231 Encounter for screening mammogram for malignant neoplasm of breast: Secondary | ICD-10-CM

## 2023-02-01 NOTE — Progress Notes (Deleted)
Office Visit    Patient Name: Anna Bernard Date of Encounter: 02/01/2023  Primary Care Provider:  Billey Co, MD Primary Cardiologist:  Armanda Magic, MD Primary Electrophysiologist: None   Past Medical History    Past Medical History:  Diagnosis Date   Anemia, unspecified    Carotid artery disease (HCC)    Hyperlipidemia, unspecified    Hypertension    Hypothyroidism, unspecified    Mild CAD    PAF (paroxysmal atrial fibrillation) (HCC)    Pre-diabetes    Pulmonary emboli (HCC)    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 by duplex 2023   Vitamin B12 deficiency    Past Surgical History:  Procedure Laterality Date   ABDOMINAL HYSTERECTOMY     BIOPSY  01/13/2023   Procedure: BIOPSY;  Surgeon: Jeani Hawking, MD;  Location: [facility] ENDOSCOPY;  Service: Gastroenterology;;   COLONOSCOPY WITH PROPOFOL N/A 01/15/2023   Procedure: COLONOSCOPY WITH PROPOFOL;  Surgeon: Tressia Danas, MD;  Location: [facility] ENDOSCOPY;  Service: Gastroenterology;  Laterality: N/A;   ENTEROSCOPY N/A 11/18/2022   Procedure: ENTEROSCOPY;  Surgeon: Jeani Hawking, MD;  Location: [facility] ENDOSCOPY;  Service: Gastroenterology;  Laterality: N/A;   ENTEROSCOPY N/A 01/13/2023   Procedure: ENTEROSCOPY;  Surgeon: Jeani Hawking, MD;  Location: [facility] ENDOSCOPY;  Service: Gastroenterology;  Laterality: N/A;   GIVENS CAPSULE STUDY N/A 04/16/2018   Procedure: GIVENS CAPSULE STUDY;  Surgeon: Jeani Hawking, MD;  Location: [facility] ENDOSCOPY;  Service: Endoscopy;  Laterality: N/A;   GIVENS CAPSULE STUDY  01/15/2023   Procedure: GIVENS CAPSULE STUDY;  Surgeon: Tressia Danas, MD;  Location: [facility] ENDOSCOPY;  Service: Gastroenterology;;   HOT HEMOSTASIS N/A 11/18/2022   Procedure: HOT HEMOSTASIS (ARGON PLASMA COAGULATION/BICAP);  Surgeon: Jeani Hawking, MD;  Location: [facility] ENDOSCOPY;  Service: Gastroenterology;  Laterality: N/A;   PARS PLANA VITRECTOMY Right 08/16/2020   Procedure: PARS PLANA VITRECTOMY 25 GAUGE FOR HEMORRHAGIC  RETINAL DETACHMENT REPAIR;  Surgeon: Carmela Rima, MD;  Location: [facility] OR;  Service: Ophthalmology;  Laterality: Right;   PARS PLANA VITRECTOMY Right 09/29/2020   Procedure: PARS PLANA VITRECTOMY WITH 25 GAUGE - ENDOCAUTRY;  Surgeon: Carmela Rima, MD;  Location: [facility] OR;  Service: Ophthalmology;  Laterality: Right;   PHOTOCOAGULATION WITH LASER Right 08/16/2020   Procedure: PHOTOCOAGULATION WITH LASER;  Surgeon: Carmela Rima, MD;  Location: [facility] OR;  Service: Ophthalmology;  Laterality: Right;   PHOTOCOAGULATION WITH LASER Right 09/29/2020   Procedure: PHOTOCOAGULATION WITH LASER;  Surgeon: Carmela Rima, MD;  Location: [facility] OR;  Service: Ophthalmology;  Laterality: Right;   POLYPECTOMY  01/15/2023   Procedure: POLYPECTOMY;  Surgeon: Tressia Danas, MD;  Location: [facility] ENDOSCOPY;  Service: Gastroenterology;;   SHOULDER SURGERY     TONSILLECTOMY     TUBAL LIGATION      Allergies  No Known Allergies   History of Present Illness    PARADISE HECKSEL  is a *** year old ***with a PMH of      Since last being seen in the office patient reports***.  Patient denies chest pain, palpitations, dyspnea, PND, orthopnea, nausea, vomiting, dizziness, syncope, edema, weight gain, or early satiety.     ***Notes:  Home Medications    Current Outpatient Medications  Medication Sig Dispense Refill   Carbidopa-Levodopa ER (SINEMET CR) 25-100 MG tablet controlled release TAKE 3 TABLETS AT 7:00AM, 2 TABLETS AT 11:00AM, AND 2 TABLETS AT 4:00PM DAILY 630 tablet 0   diclofenac Sodium (VOLTAREN) 1 % GEL NEW PRESCRIPTION REQUEST: APPLY TWO GRAM  TOPICALLY TO FEET TWICE DAILY (Patient not taking: Reported on 01/12/2023) 180 g 3   ferrous sulfate 325 (65 FE) MG tablet TAKE 1 TABLET BY MOUTH EVERY DAY WITH BREAKFAST (Patient not taking: Reported on 01/12/2023) 90 tablet 1   levothyroxine (SYNTHROID) 100 MCG tablet NEW PRESCRIPTION REQUEST: TAKE ONE TABLET BY MOUTH DAILY (Patient taking differently: Take 100 mcg  by mouth daily before breakfast.) 90 tablet 3   lidocaine 4 % Place 1 patch onto the skin daily. (Patient not taking: Reported on 01/12/2023) 10 patch 2   melatonin 3 MG TABS tablet Take 3 mg by mouth at bedtime as needed (For sleep).     metoprolol tartrate (LOPRESSOR) 25 MG tablet NEW PRESCRIPTION REQUEST: TAKE 1/2 TABLET BY MOUTH TWICE DAILY (Patient taking differently: Take 12.5 mg by mouth 2 (two) times daily.) 90 tablet 3   Multiple Vitamin (MULTIVITAMIN WITH MINERALS) TABS Take 1 tablet by mouth daily with breakfast.     oxyCODONE (OXY IR/ROXICODONE) 5 MG immediate release tablet Take 1 tablet (5 mg total) by mouth every 4 (four) hours as needed for severe pain. 30 tablet 0   pantoprazole (PROTONIX) 40 MG tablet NEW PRESCRIPTION REQUEST: TAKE ONE TABLET BY MOUTH TWICE DAILY (Patient taking differently: Take 40 mg by mouth 2 (two) times daily before a meal.) 180 tablet 3   polyethylene glycol (MIRALAX / GLYCOLAX) 17 g packet Take 17 g by mouth 2 (two) times daily. (Patient not taking: Reported on 01/12/2023) 14 each 0   rosuvastatin (CRESTOR) 10 MG tablet Take 1 tablet (10 mg total) by mouth every evening. (Patient not taking: Reported on 01/12/2023) 90 tablet 3   senna-docusate (SENOKOT-S) 8.6-50 MG tablet Take 1 tablet by mouth at bedtime. Hold if diarrhea (Patient taking differently: Take 1 tablet by mouth at bedtime as needed for mild constipation (hold for diarrhea).) 30 tablet 0   TYLENOL 8 HOUR ARTHRITIS PAIN 650 MG CR tablet Take 650-1,300 mg by mouth every 8 (eight) hours as needed for pain.     No current facility-administered medications for this visit.     Review of Systems  Please see the history of present illness.    (+)*** (+)***  All other systems reviewed and are otherwise negative except as noted above.  Physical Exam    Wt Readings from Last 3 Encounters:  01/13/23 163 lb 12.8 oz (74.3 kg)  12/27/22 156 lb (70.8 kg)  11/29/22 156 lb (70.8 kg)   VW:[id number] were no  vitals filed for this visit.,There is no height or weight on file to calculate BMI.  Constitutional:      Appearance: Healthy appearance. Not in distress.  Neck:     Vascular: JVD normal.  Pulmonary:     Effort: Pulmonary effort is normal.     Breath sounds: No wheezing. No rales. Diminished in the bases Cardiovascular:     Normal rate. Regular rhythm. Normal S1. Normal S2.      Murmurs: There is no murmur.  Edema:    Peripheral edema absent.  Abdominal:     Palpations: Abdomen is soft non tender. There is no hepatomegaly.  Skin:    General: Skin is warm and dry.  Neurological:     General: No focal deficit present.     Mental Status: Alert and oriented to person, place and time.     Cranial Nerves: Cranial nerves are intact.  EKG/LABS/ Recent Cardiac Studies    ECG personally reviewed by me today - ***  Cardiac  Studies & Procedures       ECHOCARDIOGRAM  ECHOCARDIOGRAM COMPLETE 11/18/2022  Narrative ECHOCARDIOGRAM REPORT    Patient Name:   ERIANNE LADAS [facility] Date of Exam: 11/18/2022 Medical Rec #:  [id number]      Height:       61.0 in Accession #:    [id number]     Weight:       156.0 lb Date of Birth:  July 20, 1951       BSA:          1.699 m Patient Age:    71 years       BP:           162/75 mmHg Patient Gender: F              HR:           90 bpm. Exam Location:  Inpatient  Procedure: 2D Echo, Color Doppler and Cardiac Doppler  Indications:    Syncope R55  History:        Patient has prior history of Echocardiogram examinations, most recent 01/17/2022. CAD, Carotid Disease, Arrythmias:Atrial Fibrillation; Risk Factors:Former Smoker, Hypertension and Dyslipidemia.  Sonographer:    Aron Baba Referring Phys: [id number] SARA-MAIZ A THOMAS   Sonographer Comments: Suboptimal subcostal window. Image acquisition challenging due to patient body habitus and Image acquisition challenging due to respiratory motion. IMPRESSIONS   1. There is an intracavitary gradient likely  related to hyperdynamic function. Left ventricular ejection fraction, by estimation, is 70 to 75%. The left ventricle has hyperdynamic function. Left ventricular endocardial border not optimally defined to evaluate regional wall motion. Indeterminate diastolic filling due to E-A fusion. 2. Right ventricular systolic function is hyperdynamic. The right ventricular size is normal. 3. Left atrial size was moderately dilated. 4. There is an echodensity in the right atrium seen on subcostal views. 5. The mitral valve is abnormal. No evidence of mitral valve regurgitation. Moderate mitral annular calcification. 6. Aortic valve gradients may be due to hyperdynamic function. The aortic valve was not well visualized. Aortic valve regurgitation is not visualized.  Comparison(s): Prior images reviewed side by side. LVEF has increased. In correlated to prior cardiac imaging, echodensity may be most consistent with a prominent crista terminalis.  FINDINGS Left Ventricle: There is an intracavitary gradient likely related to hyperdynamic function. Left ventricular ejection fraction, by estimation, is 70 to 75%. The left ventricle has hyperdynamic function. Left ventricular endocardial border not optimally defined to evaluate regional wall motion. The left ventricular internal cavity size was small. There is no left ventricular hypertrophy. Indeterminate diastolic filling due to E-A fusion.  Right Ventricle: The right ventricular size is normal. No increase in right ventricular wall thickness. Right ventricular systolic function is hyperdynamic.  Left Atrium: Left atrial size was moderately dilated.  Right Atrium: There is an echodensity in the right atrium seen on subcostal views. Right atrial size was normal in size.  Pericardium: Trivial pericardial effusion is present.  Mitral Valve: The mitral valve is abnormal. Moderate mitral annular calcification. No evidence of mitral valve regurgitation.  Tricuspid  Valve: The tricuspid valve is normal in structure. Tricuspid valve regurgitation is not demonstrated.  Aortic Valve: Aortic valve gradients may be due to hyperdynamic function. The aortic valve was not well visualized. Aortic valve regurgitation is not visualized. Aortic valve mean gradient measures 14.0 mmHg. Aortic valve peak gradient measures 23.2 mmHg. Aortic valve area, by VTI measures 1.56 cm.  Pulmonic Valve: The pulmonic valve was not well visualized. Pulmonic valve  regurgitation is not visualized.  Aorta: The aortic root is normal in size and structure.  IAS/Shunts: No atrial level shunt detected by color flow Doppler.   LEFT VENTRICLE PLAX 2D LVIDd:         2.20 cm   Diastology LVIDs:         1.40 cm   LV e' medial:    6.53 cm/s LV PW:         0.80 cm   LV E/e' medial:  8.5 LV IVS:        0.80 cm   LV e' lateral:   8.27 cm/s LVOT diam:     1.80 cm   LV E/e' lateral: 6.7 LV SV:         71 LV SV Index:   42 LVOT Area:     2.54 cm   RIGHT VENTRICLE RV S prime:     16.00 cm/s TAPSE (M-mode): 2.1 cm  LEFT ATRIUM             Index        RIGHT ATRIUM           Index LA Vol (A2C):   68.3 ml 40.19 ml/m  RA Area:     11.10 cm LA Vol (A4C):   70.4 ml 41.42 ml/m  RA Volume:   18.80 ml  11.06 ml/m LA Biplane Vol: 71.0 ml 41.78 ml/m AORTIC VALVE AV Area (Vmax):    1.81 cm AV Area (Vmean):   1.59 cm AV Area (VTI):     1.56 cm AV Vmax:           241.00 cm/s AV Vmean:          176.000 cm/s AV VTI:            0.454 m AV Peak Grad:      23.2 mmHg AV Mean Grad:      14.0 mmHg LVOT Vmax:         171.00 cm/s LVOT Vmean:        110.000 cm/s LVOT VTI:          0.279 m LVOT/AV VTI ratio: 0.61  AORTA Ao Root diam: 3.10 cm  MITRAL VALVE                TRICUSPID VALVE MV Area (PHT): 4.21 cm     TR Peak grad:   8.6 mmHg MV Decel Time: 180 msec     TR Vmax:        147.00 cm/s MR Peak grad: 52.9 mmHg MR Vmax:      363.50 cm/s   SHUNTS MV E velocity: 55.30 cm/s   Systemic  VTI:  0.28 m MV A velocity: 105.00 cm/s  Systemic Diam: 1.80 cm MV E/A ratio:  0.53  [name] MD Electronically signed by [name] MD Signature Date/Time: 11/18/2022/4:58:03 PM    Final    MONITORS  CARDIAC EVENT MONITOR 12/20/2022  Narrative   Predominant rhythm was normal sinus rhythm with an average heart rate of 78 bpm and ranged from 65 to 104 bpm   1 episode of nonsustained ventricular tachycardia for 5 beats   CT SCANS  CT CORONARY MORPH W/CTA COR W/SCORE 01/18/2022  Addendum 01/18/2022  3:10 PM ADDENDUM REPORT: 01/18/2022 15:07  CLINICAL DATA:  This is a 72 year old female with chest pain.  EXAM: Cardiac/Coronary  CTA  TECHNIQUE: The patient was scanned on a [employer].  FINDINGS: A 100 kV prospective scan  was triggered in the descending thoracic aorta at 111 HU's. Axial non-contrast 3 mm slices were carried out through the heart. The data set was analyzed on a dedicated work station and scored using the Agatson method. Gantry rotation speed was 250 msecs and collimation was .6 mm. No beta blockade and 0.8 mg of sl NTG was given. The 3D data set was reconstructed in 5% intervals of the 67-82 % of the R-R cycle. Diastolic phases were analyzed on a dedicated work station using MPR, MIP and VRT modes. The patient received 80 cc of contrast.  Aorta: Normal size.  Aortic root calcifications.  No dissection.  Aortic Valve:  Trileaflet.  Mild calcifications.  Coronary Arteries:  Normal coronary origin.  Right dominance.  RCA is a large dominant artery that gives rise to PDA and PLA. There is no plaque.  Left main is a large artery that gives rise to LAD and LCX arteries.  LAD is a large vessel that has no plaque.  LCX is a non-dominant artery that gives rise to one large OM1 branch. There is mild (25-49%) calcified plaque in the proximal LCX. The mid and distal LCX with no plaques.  Coronary Calcium Score:  Left main:  0  Left anterior descending artery: 0  Left circumflex artery: 212  Right coronary artery: 0  Total: 212  Percentile: 84  Other findings:  Normal pulmonary vein drainage into the left atrium.  Normal left atrial appendage without a thrombus.  Normal size of the pulmonary artery.  Moderate mitral annular calcification.  IMPRESSION: 1. Coronary calcium score of 212. This was 73 percentile for age and sex matched control.  2. Normal coronary origin with right dominance.  3. CAD-RADS 2. Mild non-obstructive CAD (25-49%). Consider non-atherosclerotic causes of chest pain. Consider preventive therapy and risk factor modification.  4. Moderate mitral annular calcification.  The noncardiac portion of this study will be interpreted in separate report by the radiologist.   Electronically Signed By: Thomasene Ripple D.O. On: 01/18/2022 15:07  Narrative EXAM: OVER-READ INTERPRETATION  CT CHEST  The following report is an over-read performed by radiologist Dr. Irish Lack of [facility] on 01/18/2022. This over-read does not include interpretation of cardiac or coronary anatomy or pathology. The coronary CTA interpretation by the cardiologist is attached.  COMPARISON:  None Available.  FINDINGS: Vascular: Positive for right-sided pulmonary embolism with thrombus in the distal right pulmonary artery extending into right lower lobe segmental branches. Thrombus is nonocclusive. The entire pulmonary artery tree is not visualized.  Mediastinum/Nodes: Visualized mediastinum and hilar regions demonstrate no lymphadenopathy or masses.  Lungs/Pleura: Visualized lungs show no evidence of pulmonary edema, consolidation, pneumothorax or nodule. Trace bilateral pleural fluid.  Upper Abdomen: No acute abnormality.  Musculoskeletal: No chest wall mass or suspicious bone lesions identified.  IMPRESSION: Nonocclusive right-sided pulmonary embolism. This is  potentially not completely visualized but appears to be nonocclusive at the level of the distal right pulmonary artery and extending into lower lobe segmental branches. Consider full CTA of the chest with more optimal pulmonary artery opacification.  Electronically Signed: By: Irish Lack M.D. On: 01/18/2022 14:33          Risk Assessment/Calculations:   {Does this patient have ATRIAL FIBRILLATION?[phone]}        Lab Results  Component Value Date   WBC 7.4 01/17/2023   HGB 9.2 (A) 01/23/2023   HCT 24.9 (L) 01/17/2023   MCV 72.6 (L) 01/17/2023   PLT 361 01/17/2023  Lab Results  Component Value Date   CREATININE 0.73 01/15/2023   BUN 5 (L) 01/15/2023   NA 139 01/15/2023   K 3.4 (L) 01/15/2023   CL 107 01/15/2023   CO2 24 01/15/2023   Lab Results  Component Value Date   ALT 5 01/12/2023   AST 13 (L) 01/12/2023   ALKPHOS 54 01/12/2023   BILITOT 0.7 01/12/2023   Lab Results  Component Value Date   CHOL 133 01/17/2022   HDL 48 01/17/2022   LDLCALC 72 01/17/2022   TRIG 65 01/17/2022   CHOLHDL 2.8 01/17/2022    Lab Results  Component Value Date   HGBA1C 5.2 11/14/2022     Assessment & Plan    1.***  2.***  3.***  4.***      Disposition: Follow-up with Armanda Magic, MD or APP in *** months {Are you ordering a CV Procedure (e.g. stress test, cath, DCCV, TEE, etc)?   Press F2        :[id number]}   Medication Adjustments/Labs and Tests Ordered: Current medicines are reviewed at length with the patient today.  Concerns regarding medicines are outlined above.   Signed, Napoleon Form, Leodis Rains, NP 02/01/2023, 4:45 PM [facility]

## 2023-02-03 ENCOUNTER — Ambulatory Visit: Payer: Medicare Other | Admitting: Nurse Practitioner

## 2023-02-09 NOTE — Progress Notes (Signed)
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  This did not show efficacy of levodopa.  However, when I took the patient off of levodopa for 24 hours prior to seeing her , in anticipation for levodopa challenge test, the patient and her husband reported that she did significantly worse off of the medication.  They therefore have opted to stay on the medication.             -change timing of carbidopa/levodopa CR, 2 at 9-10am, 2 at 1pm, 2 at 5pm, 1 at 7-8pm  -has a walker at home but has not used it.  Needs to start using it at all times.    -discussed shower chair and they have one         2.  Atrial fibrillation             -Patient on Eliquis, but they are likely going to place Watchman device soon because of multiple syncopal episodes  3.  History of symptomatic anemia  -Has contributed to the syncope.  -Due to small bowel AVM  4.  Multiple syncopal episodes  -Cardiology feels that these are orthostatic in nature, although many of these occurred while seated.  -The most recent did not occur in the face of significant anemia, and was felt due to hypovolemia  -Cardiology following and managing.  Subjective:   Anna Bernard was seen today in follow up for Parkinsons disease.  My previous records were reviewed prior to todays visit as well as outside records available to me.  Patient accompanied by husband and daughter who supplements the history.  Patient has had multiple syncopal episodes since our last visit, resulting in hospital admissions.  She was admitted to the hospital at the end of February for syncope.  She had seen her cardiologist just a few days prior and mentioned that she had syncope 2 days before.  She was sitting in a chair, told her husband she felt sleepy and passed out and was unresponsive for 5 minutes.  A few days later, the same thing happened after taking a hot shower.  They got her out of the  shower and were sitting down and drying her off and then she passed out for 1 or 2 minutes.  There was no loss of bladder or bowel control.  There was no shaking.  There is no confusion.  Her cardiologist felt it was orthostatic.  2 days later, she was in the emergency room with syncope.  There is very few details on that that syncopal episode.  It is stated that patient felt her heart was racing and became short of breath.  She then passed out.  It does not state if she was sitting or standing for this event.  Nonetheless, her hemoglobin was 7.5 on admission to the hospital and GI was consulted.  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  Ultimately, it was felt that patient's events were likely due to orthostasis from blood volume depletion in the setting of symptomatic anemia.  Patient returned to the hospital April 25 with 2 more syncopal episodes in her bathroom.  Cardiology records indicate that she was sitting in a chair when her husband and daughter were trying to get her up when she passed out.  Cardiology still felt her passing out was from orthostasis,  but felt she needed the very low-dose metoprolol for rate control.  Because of the episodes, cardiology recommended a Watchman device.  She has had no further syncopal episodes.  Daughter states today that having issues with dosing with carbidopa/levodopa.  She is waking up for the day at 9-10 am but they are starting the med at 7am but she is just going back to bed.  She goes back to bed in the evening at 11pm but by mid/late evening, she can't move bc last dose is at 4pm.  She is not cramping at night.  She had hammer toe surgery on 4/15.  She is walking better.    Current prescribed movement disorder medications: Carbidopa/levodopa 25/100 CR, 3 tablets at 7 AM/2 tablets at 11 AM/2 at 4pm  PREVIOUS MEDICATIONS: Sinemet  ALLERGIES:  No Known Allergies  CURRENT MEDICATIONS:  Outpatient Encounter Medications as of  02/14/2023  Medication Sig   Carbidopa-Levodopa ER (SINEMET CR) 25-100 MG tablet controlled release TAKE 3 TABLETS AT 7:00AM, 2 TABLETS AT 11:00AM, AND 2 TABLETS AT 4:00PM DAILY   levothyroxine (SYNTHROID) 100 MCG tablet NEW PRESCRIPTION REQUEST: TAKE ONE TABLET BY MOUTH DAILY (Patient taking differently: Take 100 mcg by mouth daily before breakfast.)   melatonin 3 MG TABS tablet Take 3 mg by mouth at bedtime as needed (For sleep).   metoprolol tartrate (LOPRESSOR) 25 MG tablet NEW PRESCRIPTION REQUEST: TAKE 1/2 TABLET BY MOUTH TWICE DAILY (Patient taking differently: Take 12.5 mg by mouth 2 (two) times daily.)   Multiple Vitamin (MULTIVITAMIN WITH MINERALS) TABS Take 1 tablet by mouth daily with breakfast.   oxyCODONE (OXY IR/ROXICODONE) 5 MG immediate release tablet Take 1 tablet (5 mg total) by mouth every 4 (four) hours as needed for severe pain.   pantoprazole (PROTONIX) 40 MG tablet NEW PRESCRIPTION REQUEST: TAKE ONE TABLET BY MOUTH TWICE DAILY (Patient taking differently: Take 40 mg by mouth 2 (two) times daily before a meal.)   rosuvastatin (CRESTOR) 10 MG tablet Take 1 tablet (10 mg total) by mouth every evening.   senna-docusate (SENOKOT-S) 8.6-50 MG tablet Take 1 tablet by mouth at bedtime. Hold if diarrhea (Patient taking differently: Take 1 tablet by mouth at bedtime as needed for mild constipation (hold for diarrhea).)   TYLENOL 8 HOUR ARTHRITIS PAIN 650 MG CR tablet Take 650-1,300 mg by mouth every 8 (eight) hours as needed for pain.   [DISCONTINUED] diclofenac Sodium (VOLTAREN) 1 % GEL NEW PRESCRIPTION REQUEST: APPLY TWO GRAM TOPICALLY TO FEET TWICE DAILY (Patient not taking: Reported on 01/12/2023)   [DISCONTINUED] ferrous sulfate 325 (65 FE) MG tablet TAKE 1 TABLET BY MOUTH EVERY DAY WITH BREAKFAST (Patient not taking: Reported on 01/12/2023)   [DISCONTINUED] lidocaine 4 % Place 1 patch onto the skin daily. (Patient not taking: Reported on 01/12/2023)   [DISCONTINUED] polyethylene glycol  (MIRALAX / GLYCOLAX) 17 g packet Take 17 g by mouth 2 (two) times daily. (Patient not taking: Reported on 01/12/2023)   No facility-administered encounter medications on file as of 02/14/2023.    Objective:   PHYSICAL EXAMINATION:    VITALS:   Vitals:   02/14/23 1104  BP: 110/70  Pulse: 61  SpO2: 98%  Weight: 169 lb (76.7 kg)  Height: 5\' 2"  (1.575 m)     GEN:  The patient appears stated age and is in NAD. HEENT:  Normocephalic, atraumatic.  The mucous membranes are moist. The superficial temporal arteries are without ropiness or tenderness. CV:  RRR Lungs:  CTAB Neck/HEME:  There  are no carotid bruits bilaterally.  Neurological examination:  Orientation: The patient is alert and oriented x3. Cranial nerves: There is good facial symmetry with facial hypomimia.  She would intermittently close 1 eye, but stated that she had no double vision.  She stated that she was purposefully closing it because she was sleepy.  This is the same as what she has done in the past.  The speech is fluent and clear. Soft palate rises symmetrically and there is no tongue deviation. Hearing is intact to conversational tone. Sensation: Sensation is intact to light touch throughout Motor: Strength is at least antigravity x4.   Movement examination: Tone: There is nl tone in the ue/le Abnormal movements: none Coordination:  There is decremation with RAM's, with any form of RAMS, including alternating supination and pronation of the forearm, hand opening and closing, finger taps, heel taps and toe taps, mostly on the right.  This is the same as last visit.  She does have on a hard boot (orthopedic) Gait and Station: She is assisted out of the transport chair.  She is given a walker.  She is slow and shuffles   I have reviewed and interpreted the following labs independently    Chemistry      Component Value Date/Time   NA 139 01/15/2023 0120   NA 142 11/29/2022 1649   K 3.4 (L) 01/15/2023 0120   CL  107 01/15/2023 0120   CO2 24 01/15/2023 0120   BUN 5 (L) 01/15/2023 0120   BUN 15 11/29/2022 1649   CREATININE 0.73 01/15/2023 0120   CREATININE 0.52 04/21/2013 1526   GLU 133 08/27/2012 0000      Component Value Date/Time   CALCIUM 8.5 (L) 01/15/2023 0120   ALKPHOS 54 01/12/2023 1537   AST 13 (L) 01/12/2023 1537   ALT 5 01/12/2023 1537   BILITOT 0.7 01/12/2023 1537   BILITOT 0.2 11/14/2022 1411       Lab Results  Component Value Date   WBC 7.4 01/17/2023   HGB 9.2 (A) 01/23/2023   HCT 24.9 (L) 01/17/2023   MCV 72.6 (L) 01/17/2023   PLT 361 01/17/2023    Lab Results  Component Value Date   TSH 0.620 11/18/2022     Cc:  Billey Co, MD

## 2023-02-14 ENCOUNTER — Ambulatory Visit: Payer: Medicare Other | Admitting: Neurology

## 2023-02-14 ENCOUNTER — Encounter: Payer: Self-pay | Admitting: Neurology

## 2023-02-14 DIAGNOSIS — G214 Vascular parkinsonism: Secondary | ICD-10-CM

## 2023-02-14 MED ORDER — CARBIDOPA-LEVODOPA ER 25-100 MG PO TBCR: EXTENDED_RELEASE_TABLET | ORAL | 0 refills | Status: DC

## 2023-02-14 NOTE — Patient Instructions (Addendum)
Take carbidopa/levodopa CR, 2 at 9-10am, 2 at 1pm, 2 at 5pm, 1 at 7-8pm

## 2023-02-24 ENCOUNTER — Ambulatory Visit: Payer: Medicare Other | Admitting: Cardiovascular Disease

## 2023-02-26 NOTE — Progress Notes (Unsigned)
Electrophysiology Office Note:    Date:  02/26/2023   ID:  Anna Bernard, DOB 1951/07/13, MRN [id number]  [facility] Cardiologist:  Armanda Magic, MD  [facility] Electrophysiologist:  Lanier Prude, MD   Referring MD: Billey Co, MD   Chief Complaint: Atrial fibrillation  History of Present Illness:    Anna Bernard is a 72 y.o. female who I am seeing today for an evaluation of atrial fibrillation at the request of Dr. Mayford Knife.  The patient has a history of atrial fibrillation, hyperlipidemia, pulmonary embolism, hypertension, Parkinson's disease, prediabetes, microcytic anemia, chronic low back pain and multiple falls. 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       Their past medical, social and family history was reveiwed.   ROS:   Please see the history of present illness.    All other systems reviewed and are negative.  EKGs/Labs/Other Studies Reviewed:    The following studies were reviewed today:  Jan 18, 2022 CT coronary personally reviewed Right-sided pulmonary embolism, nonocclusive    Physical Exam:    VS:  There were no vitals taken for this visit.    Wt Readings from Last 3 Encounters:  02/14/23 169 lb (76.7 kg)  01/13/23 163 lb 12.8 oz (74.3 kg)  12/27/22 156 lb (70.8 kg)     GEN: *** Well nourished, well developed in no acute distress CARDIAC: ***RRR, no murmurs, rubs, gallops RESPIRATORY:  Clear to auscultation without rales, wheezing or rhonchi       ASSESSMENT AND PLAN:    1. PAF (paroxysmal atrial fibrillation) (HCC)   2. History of pulmonary embolism   3. Gastrointestinal hemorrhage associated with chronic gastritis     #AF #Hx of DVT #Hx of GI bleeding #Anemia  I have seen [name] in the office today who is being considered for a Watchman left atrial appendage closure device. Unfortunately, the patient is not felt to be a long term  anticoagulation candidate secondary to frequent falls with personal injury.  Procedural risks for the Watchman implant have been reviewed with the patient including a 0.5% risk of stroke, <1% risk of perforation and <1% risk of device embolization. Other risks include bleeding, vascular damage, tamponade, worsening renal function, and death.    The published clinical data on the safety and effectiveness of WATCHMAN include but are not limited to the following: - Holmes DR, Everlene Farrier, Sick P et al. for the PROTECT AF Investigators. Percutaneous closure of the left atrial appendage versus warfarin therapy for prevention of stroke in patients with atrial fibrillation: a randomised non-inferiority trial. Lancet 2009; 374: 534-42. Everlene Farrier, Doshi SK, Isa Rankin D et al. on behalf of the PROTECT AF Investigators. Percutaneous Left Atrial Appendage Closure for Stroke Prophylaxis in Patients With Atrial Fibrillation 2.3-Year Follow-up of the PROTECT AF (Watchman Left Atrial Appendage System for Embolic Protection in Patients With Atrial Fibrillation) Trial. Circulation 2013; 127:720-729. - Alli O, Doshi S,  Kar S, Reddy VY, Sievert H et al. Quality of Life Assessment in the Randomized PROTECT AF (Percutaneous Closure of the Left Atrial Appendage Versus Warfarin Therapy for Prevention of Stroke in Patients With Atrial Fibrillation) Trial of Patients at Risk for Stroke With Nonvalvular Atrial Fibrillation. J Am Coll Cardiol 2013; 61:1790-8. Aline August DR, Mia Creek, Price Judie Petit, Whisenant B, Sievert H, Doshi S, Huber K, Reddy V. Prospective randomized evaluation of the Watchman left atrial appendage  Device in patients with atrial fibrillation versus long-term warfarin therapy; the PREVAIL trial. Journal of the [employer] of Cardiology, Vol. 4, No. 1, 2014, 1-11. - Kar S, Doshi SK, Sadhu A, Horton R, Osorio J et al. Primary outcome evaluation of a next-generation left atrial appendage closure device: results from  the PINNACLE FLX trial. Circulation 2021;143(18)1754-1762.   HAS-BLED score *** Hypertension {YES/NO:21197} Abnormal renal and liver function (Dialysis, transplant, Cr >2.26 mg/dL /Cirrhosis or Bilirubin >2x Normal or AST/ALT/AP >3x Normal) {YES/NO:21197} Stroke {YES/NO:21197} Bleeding {YES/NO:21197} Labile INR (Unstable/high INR) {YES/NO:21197} Elderly (>65) {YES/NO:21197} Drugs or alcohol (? 8 drinks/week, anti-plt or NSAID) {YES/NO:21197}  CHA2DS2-VASc Score = 5  The patient's score is based upon: CHF History: 0 HTN History: 1 Diabetes History: 0 Stroke History: 2 Vascular Disease History: 0 Age Score: 1 Gender Score: 1  The patient has a history of pulmonary embolism that was not provoked.  Given the need for lifelong anticoagulation for DVT/PE, left atrial appendage occlusion is not currently indicated.  I explained this in detail to the patient and her family.        Signed, Rossie Muskrat. Lalla Brothers, MD, [facility], [facility] 02/26/2023 11:57 AM    Electrophysiology [facility]

## 2023-02-27 ENCOUNTER — Ambulatory Visit: Payer: Medicare Other | Attending: Cardiovascular Disease | Admitting: Cardiology

## 2023-02-27 ENCOUNTER — Encounter: Payer: Self-pay | Admitting: Cardiology

## 2023-02-27 VITALS — BP 102/60 | HR 77 | Ht 61.0 in | Wt 160.0 lb

## 2023-02-27 DIAGNOSIS — K2951 Unspecified chronic gastritis with bleeding: Secondary | ICD-10-CM

## 2023-02-27 DIAGNOSIS — I480 Paroxysmal atrial fibrillation: Secondary | ICD-10-CM

## 2023-02-27 DIAGNOSIS — Z86711 Personal history of pulmonary embolism: Secondary | ICD-10-CM

## 2023-02-27 NOTE — Patient Instructions (Signed)
Medication Instructions:  Your physician recommends that you continue on your current medications as directed. Please refer to the Current Medication list given to you today.  *If you need a refill on your cardiac medications before your next appointment, please call your pharmacy*  Follow-Up: At [facility], you and your health needs are our priority.  As part of our continuing mission to provide you with exceptional heart care, we have created designated Provider Care Teams.  These Care Teams include your primary Cardiologist (physician) and Advanced Practice Providers (APPs -  Physician Assistants and Nurse Practitioners) who all work together to provide you with the care you need, when you need it.  Follow up with Dr. Lalla Brothers as needed.

## 2023-03-01 DIAGNOSIS — D509 Iron deficiency anemia, unspecified: Secondary | ICD-10-CM | POA: Diagnosis not present

## 2023-03-13 DIAGNOSIS — H34831 Tributary (branch) retinal vein occlusion, right eye, with macular edema: Secondary | ICD-10-CM | POA: Diagnosis not present

## 2023-03-14 ENCOUNTER — Telehealth: Payer: Self-pay | Admitting: Cardiology

## 2023-03-14 MED ORDER — ROSUVASTATIN CALCIUM 10 MG PO TABS: 10.0000 mg | ORAL_TABLET | Freq: Every evening | ORAL | 3 refills | Status: DC

## 2023-03-14 NOTE — Telephone Encounter (Signed)
 *  STAT* If patient is at the pharmacy, call can be transferred to refill team.   1. Which medications need to be refilled? (please list name of each medication and dose if known)   rosuvastatin (CRESTOR) 10 MG tablet   2. Which pharmacy/location (including street and city if local pharmacy) is medication to be sent to?  NEW PHARMACY: [id number] - [place], [place] - [address]    3. Do they need a 30 day or 90 day supply?  90 day  [facility] and [facility] to that location

## 2023-03-14 NOTE — Telephone Encounter (Signed)
[facility]

## 2023-03-20 DIAGNOSIS — M2041 Other hammer toe(s) (acquired), right foot: Secondary | ICD-10-CM | POA: Diagnosis not present

## 2023-03-21 ENCOUNTER — Other Ambulatory Visit: Payer: Self-pay | Admitting: Family Medicine

## 2023-03-21 ENCOUNTER — Telehealth: Payer: Self-pay

## 2023-03-21 DIAGNOSIS — G20A2 Parkinson's disease without dyskinesia, with fluctuations: Secondary | ICD-10-CM

## 2023-03-21 NOTE — Telephone Encounter (Signed)
Called Maggie back at [phone]  and LVM with the information below.   Sunday Spillers, CMA

## 2023-03-21 NOTE — Telephone Encounter (Signed)
Maggie from [employer] is calling to ask for a Michiel [employer] lift for pt. Husband is not able to take care of pt very well without assistance.  Once placed please let Melvenia Beam know and I will call Maggie to let her know. Sunday Spillers, CMA

## 2023-03-21 NOTE — Progress Notes (Signed)
[facility] lift ordered, message routing to [employer].   Burley Saver MD

## 2023-03-27 ENCOUNTER — Inpatient Hospital Stay (HOSPITAL_COMMUNITY)
Admission: EM | Admit: 2023-03-27 | Discharge: 2023-04-07 | DRG: 690 | Disposition: A | Discharge status: Skilled Nursing Facility | Payer: Medicare Other | Attending: Family Medicine | Admitting: Family Medicine

## 2023-03-27 ENCOUNTER — Other Ambulatory Visit: Payer: Self-pay

## 2023-03-27 DIAGNOSIS — Z86711 Personal history of pulmonary embolism: Secondary | ICD-10-CM | POA: Diagnosis not present

## 2023-03-27 DIAGNOSIS — Z79890 Hormone replacement therapy (postmenopausal): Secondary | ICD-10-CM

## 2023-03-27 DIAGNOSIS — R2689 Other abnormalities of gait and mobility: Secondary | ICD-10-CM | POA: Diagnosis not present

## 2023-03-27 DIAGNOSIS — B9689 Other specified bacterial agents as the cause of diseases classified elsewhere: Secondary | ICD-10-CM | POA: Diagnosis not present

## 2023-03-27 DIAGNOSIS — Z7189 Other specified counseling: Secondary | ICD-10-CM | POA: Diagnosis not present

## 2023-03-27 DIAGNOSIS — R55 Syncope and collapse: Secondary | ICD-10-CM | POA: Diagnosis not present

## 2023-03-27 DIAGNOSIS — I2510 Atherosclerotic heart disease of native coronary artery without angina pectoris: Secondary | ICD-10-CM | POA: Diagnosis present

## 2023-03-27 DIAGNOSIS — Z79899 Other long term (current) drug therapy: Secondary | ICD-10-CM

## 2023-03-27 DIAGNOSIS — K922 Gastrointestinal hemorrhage, unspecified: Secondary | ICD-10-CM | POA: Diagnosis not present

## 2023-03-27 DIAGNOSIS — R5381 Other malaise: Secondary | ICD-10-CM | POA: Diagnosis not present

## 2023-03-27 DIAGNOSIS — K921 Melena: Secondary | ICD-10-CM | POA: Diagnosis not present

## 2023-03-27 DIAGNOSIS — R1310 Dysphagia, unspecified: Secondary | ICD-10-CM | POA: Diagnosis present

## 2023-03-27 DIAGNOSIS — I480 Paroxysmal atrial fibrillation: Secondary | ICD-10-CM | POA: Diagnosis not present

## 2023-03-27 DIAGNOSIS — I6523 Occlusion and stenosis of bilateral carotid arteries: Secondary | ICD-10-CM | POA: Diagnosis not present

## 2023-03-27 DIAGNOSIS — E119 Type 2 diabetes mellitus without complications: Secondary | ICD-10-CM | POA: Diagnosis not present

## 2023-03-27 DIAGNOSIS — E785 Hyperlipidemia, unspecified: Secondary | ICD-10-CM | POA: Diagnosis present

## 2023-03-27 DIAGNOSIS — N390 Urinary tract infection, site not specified: Principal | ICD-10-CM | POA: Diagnosis present

## 2023-03-27 DIAGNOSIS — G20B1 Parkinson's disease with dyskinesia, without mention of fluctuations: Secondary | ICD-10-CM | POA: Diagnosis not present

## 2023-03-27 DIAGNOSIS — G20A1 Parkinson's disease without dyskinesia, without mention of fluctuations: Secondary | ICD-10-CM | POA: Diagnosis not present

## 2023-03-27 DIAGNOSIS — D509 Iron deficiency anemia, unspecified: Secondary | ICD-10-CM | POA: Diagnosis not present

## 2023-03-27 DIAGNOSIS — M6281 Muscle weakness (generalized): Secondary | ICD-10-CM | POA: Diagnosis not present

## 2023-03-27 DIAGNOSIS — A499 Bacterial infection, unspecified: Secondary | ICD-10-CM | POA: Diagnosis not present

## 2023-03-27 DIAGNOSIS — R310 Gross hematuria: Secondary | ICD-10-CM | POA: Diagnosis present

## 2023-03-27 DIAGNOSIS — R7303 Prediabetes: Secondary | ICD-10-CM | POA: Diagnosis present

## 2023-03-27 DIAGNOSIS — E039 Hypothyroidism, unspecified: Secondary | ICD-10-CM | POA: Diagnosis present

## 2023-03-27 DIAGNOSIS — R410 Disorientation, unspecified: Secondary | ICD-10-CM | POA: Diagnosis not present

## 2023-03-27 DIAGNOSIS — R1312 Dysphagia, oropharyngeal phase: Secondary | ICD-10-CM | POA: Diagnosis not present

## 2023-03-27 DIAGNOSIS — Z789 Other specified health status: Secondary | ICD-10-CM | POA: Diagnosis not present

## 2023-03-27 DIAGNOSIS — Z1612 Extended spectrum beta lactamase (ESBL) resistance: Secondary | ICD-10-CM | POA: Diagnosis not present

## 2023-03-27 DIAGNOSIS — Z87440 Personal history of urinary (tract) infections: Secondary | ICD-10-CM | POA: Diagnosis not present

## 2023-03-27 DIAGNOSIS — R404 Transient alteration of awareness: Secondary | ICD-10-CM | POA: Diagnosis not present

## 2023-03-27 DIAGNOSIS — I10 Essential (primary) hypertension: Secondary | ICD-10-CM | POA: Diagnosis present

## 2023-03-27 DIAGNOSIS — R531 Weakness: Secondary | ICD-10-CM

## 2023-03-27 DIAGNOSIS — R059 Cough, unspecified: Secondary | ICD-10-CM | POA: Diagnosis not present

## 2023-03-27 DIAGNOSIS — Z743 Need for continuous supervision: Secondary | ICD-10-CM | POA: Diagnosis not present

## 2023-03-27 DIAGNOSIS — Z515 Encounter for palliative care: Secondary | ICD-10-CM | POA: Diagnosis not present

## 2023-03-27 DIAGNOSIS — Z7401 Bed confinement status: Secondary | ICD-10-CM | POA: Diagnosis not present

## 2023-03-27 DIAGNOSIS — Z741 Need for assistance with personal care: Secondary | ICD-10-CM | POA: Diagnosis not present

## 2023-03-27 DIAGNOSIS — I2699 Other pulmonary embolism without acute cor pulmonale: Secondary | ICD-10-CM | POA: Diagnosis present

## 2023-03-27 LAB — CBC WITH DIFFERENTIAL/PLATELET
Abs Immature Granulocytes: 0.03 10*3/uL (ref 0.00–0.07)
Basophils Absolute: 0 10*3/uL (ref 0.0–0.1)
Basophils Relative: 0 %
Eosinophils Absolute: 0 10*3/uL (ref 0.0–0.5)
Eosinophils Relative: 0 %
HCT: 34.4 % — ABNORMAL LOW (ref 36.0–46.0)
Hemoglobin: 11.2 g/dL — ABNORMAL LOW (ref 12.0–15.0)
Immature Granulocytes: 0 %
Lymphocytes Relative: 11 %
Lymphs Abs: 0.8 10*3/uL (ref 0.7–4.0)
MCH: 24.9 pg — ABNORMAL LOW (ref 26.0–34.0)
MCHC: 32.6 g/dL (ref 30.0–36.0)
MCV: 76.6 fL — ABNORMAL LOW (ref 80.0–100.0)
Monocytes Absolute: 0.4 10*3/uL (ref 0.1–1.0)
Monocytes Relative: 5 %
Neutro Abs: 6.3 10*3/uL (ref 1.7–7.7)
Neutrophils Relative %: 84 %
Platelets: 223 10*3/uL (ref 150–400)
RBC: 4.49 MIL/uL (ref 3.87–5.11)
RDW: 17.3 % — ABNORMAL HIGH (ref 11.5–15.5)
WBC: 7.6 10*3/uL (ref 4.0–10.5)
nRBC: 0 % (ref 0.0–0.2)

## 2023-03-27 LAB — COMPREHENSIVE METABOLIC PANEL
ALT: 6 U/L (ref 0–44)
AST: 19 U/L (ref 15–41)
Albumin: 2.9 g/dL — ABNORMAL LOW (ref 3.5–5.0)
Alkaline Phosphatase: 55 U/L (ref 38–126)
Anion gap: 14 (ref 5–15)
BUN: 13 mg/dL (ref 8–23)
CO2: 20 mmol/L — ABNORMAL LOW (ref 22–32)
Calcium: 8.1 mg/dL — ABNORMAL LOW (ref 8.9–10.3)
Chloride: 105 mmol/L (ref 98–111)
Creatinine, Ser: 0.93 mg/dL (ref 0.44–1.00)
GFR, Estimated: >60 mL/min (ref >60)
Glucose, Bld: 120 mg/dL — ABNORMAL HIGH (ref 70–99)
Potassium: 3.9 mmol/L (ref 3.5–5.1)
Sodium: 139 mmol/L (ref 135–145)
Total Bilirubin: 0.5 mg/dL (ref 0.3–1.2)
Total Protein: 6.8 g/dL (ref 6.5–8.1)

## 2023-03-27 LAB — URINALYSIS, W/ REFLEX TO CULTURE (INFECTION SUSPECTED)
Bilirubin Urine: NEGATIVE
Glucose, UA: NEGATIVE mg/dL
Hgb urine dipstick: NEGATIVE
Ketones, ur: 20 mg/dL — AB
Nitrite: NEGATIVE
Protein, ur: 100 mg/dL — AB
Specific Gravity, Urine: 1.014 (ref 1.005–1.030)
WBC, UA: >50 WBC/hpf (ref 0–5)
pH: 5 (ref 5.0–8.0)

## 2023-03-27 LAB — TROPONIN I (HIGH SENSITIVITY)
Troponin I (High Sensitivity): 7 ng/L (ref <18)
Troponin I (High Sensitivity): 8 ng/L (ref <18)

## 2023-03-27 LAB — URINE CULTURE

## 2023-03-27 LAB — TYPE AND SCREEN
ABO/RH(D): O POS
Antibody Screen: NEGATIVE

## 2023-03-27 LAB — POC OCCULT BLOOD, ED: Fecal Occult Bld: POSITIVE — AB

## 2023-03-27 MED ORDER — PANTOPRAZOLE SODIUM 40 MG IV SOLR
40.0000 mg | Freq: Two times a day (BID) | INTRAVENOUS | Status: DC
  Administered 2023-03-28 – 2023-03-29 (×5): 40 mg via INTRAVENOUS
  Filled 2023-03-27 (×6): qty 10

## 2023-03-27 MED ORDER — METOPROLOL TARTRATE 12.5 MG HALF TABLET
12.5000 mg | ORAL_TABLET | Freq: Two times a day (BID) | ORAL | Status: DC
  Administered 2023-03-28 – 2023-04-07 (×22): 12.5 mg via ORAL
  Filled 2023-03-27 (×22): qty 1

## 2023-03-27 MED ORDER — ONDANSETRON 4 MG PO TBDP
ORAL_TABLET | ORAL | Status: AC
  Filled 2023-03-27: qty 1

## 2023-03-27 MED ORDER — SODIUM CHLORIDE 0.9 % IV SOLN
1.0000 g | Freq: Once | INTRAVENOUS | Status: AC
  Administered 2023-03-27: 1 g via INTRAVENOUS
  Filled 2023-03-27: qty 10

## 2023-03-27 MED ORDER — SODIUM CHLORIDE 0.9 % IV SOLN: 1.0000 g | Freq: Once | INTRAVENOUS | Status: DC

## 2023-03-27 MED ORDER — SODIUM CHLORIDE 0.9 % IV SOLN: INTRAVENOUS | Status: DC

## 2023-03-27 MED ORDER — CARBIDOPA-LEVODOPA ER 25-100 MG PO TBCR
1.0000 | EXTENDED_RELEASE_TABLET | Freq: Once | ORAL | Status: AC
  Administered 2023-03-28: 1 via ORAL
  Filled 2023-03-27: qty 1

## 2023-03-27 NOTE — H&P (Cosign Needed)
Hospital Admission History and Physical Service Pager: [phone]  Patient name: Anna Bernard Medical record number: [id number] Date of Birth: [birth year] Age: 72 y.o. Gender: female  Primary Care Provider: Billey Co, MD Consultants: Palliative Code Status: FULL which was confirmed with family if patient unable to confirm  Preferred Emergency Contact: Husband - Pryor Montes  Chief Complaint: syncope  Assessment and Plan: ALEXEI HETZEL is a 72 y.o. female presenting with syncopal episode earlier today after BM. In the ED she was also found to have UTI with gross hematuria and dark, melanotic stool. Differential for presentation of this includes GI bleed, Vasovagal, Dehydration, Cardiac arrythmia, Malignancy, Parkinson's progression.   Patient is agreeable to admission and discussion with Palliative Care team about goals of care.  Hospital Problem List      Hospital     * (Principal) Syncope     Capsule endoscopy repeated after negative colonoscopy findings. Syncope workup thus far unremarkable, cardiology believes it is likely  orthostatic hypotension.  - GI following, appreciate recommendations   - Colonoscopy today  - Cardiology following, appreciate recommendations - Monitor vitals, cardiac telemetry  - AM CBC       FEN/GI: NPO VTE Prophylaxis: SCDs  Disposition: admit to Progressive  History of Present Illness:  Anna EAVENSON is a 72 y.o. female presenting with syncope following a BM. She was also found to have UTI with gross hematuria and dark, melanotic stool. She denies hitting her head when she fell, but cannot remember the exact details of the event. Her husband was present and helped her up. She denies any pain, shortness of breath, dizziness at this time. She does report UTI symptoms of dysuria for 1 month. Denies fever or chills. She reports she has NOT been taking Eliquis.  In the ED, patient received single dose Rocephin for UTI. EKG NSR. Labs revealed  Hgb 11.2, troponins WNL. Admission was ordered for further monitoring.   Review Of Systems: Per HPI  Pertinent Past Medical History: Parkinson's Disease CAD HTN Hyperlipidemia Hypothyroidism Paroxysmal Atrial Fibrillation PE Prediabetes Anemia Remainder reviewed in history tab.   Pertinent Social History: Tobacco use: No Alcohol use: No Other Substance use: No Lives with husband [name]  Important Outpatient Medications: Levothyroxine Pantoprazole 40mg  Rosuvastatin 10mg  Remainder reviewed in medication history.   Objective: BP (!) 165/83 (BP Location: Right Arm)   Pulse 77   Temp 98.7 F (37.1 C) (Oral)   Resp 17   Ht 5\' 1"  (1.549 m)   Wt 72 kg   SpO2 96%   BMI 29.99 kg/m  Exam: General: chronically ill-appearing, no acute distress Cardio: RRR, no murmurs on exam. Pulm: Clear, no wheezing, no crackles. No increased work of breathing Abdominal: bowel sounds absent, soft, non-tender, non-distended Extremities: no peripheral edema, skin warm, pedal pulses intact Neuro: alert to self and location but not to date. Speech normal in content, no facial asymmetry, pupils equal and reactive to light.  Psych:  Cognition and judgment appear intact. Alert, communicative  and cooperative with normal attention span and concentration. No apparent delusions, illusions, hallucinations.  Labs:  CBC BMET  Recent Labs  Lab 03/27/23 1430  WBC 7.6  HGB 11.2*  HCT 34.4*  PLT 223   Recent Labs  Lab 03/27/23 1430  NA 139  K 3.9  CL 105  CO2 20*  BUN 13  CREATININE 0.93  GLUCOSE 120*  CALCIUM 8.1*     EKG: NSR   Cyndia Skeeters, DO  03/27/2023, 11:59 PM PGY-1, [facility]  FPTS Intern pager: [phone], text pages welcome Secure chat group [facility] Teaching Service

## 2023-03-27 NOTE — Hospital Course (Addendum)
Anna Bernard is a 72 y.o.female with a history of CAD, HTN, Hyperlipidemia, Anemia, Paroxysmal Afib, PE, and Prediabetes who was admitted to the [facility] Medicine Teaching Service at [facility] for Syncope. Her hospital course is detailed below:  UTI Patient endorses having UTI symptoms of dysuria for 1 month. Urine was cloudy and malodorous. Given 1 time dose Rocephin in the ED. Past urine cultures showed probable ESBL so patient was given Fosfomycin x1 in the ED.  Patient not complaining of any UTI sx on discharge.   Syncope Patient presented to ED after syncopal episode following BM. As patient has hx of small bowel AVMs, did CBC for acute blood loss. Hgb was 11.2, which actually appears to be better than patient's baseline (9). Troponins were WNL and EKG showed NSR. Head CT showed no acute intracranial process with some stable mild atrophy and chronic small vessel ischemic changes. This is likely multifactorial and most likely related to balance issues and rigidity from Parkinson's disease. Patient was treated with IVF on admission and blood pressures improved. Patient saw PT/OT who recommended short term SNF placement.   Melanic Stool in ED Patient was FOB+ with one melanic stool per ED, though Hg was stable. Patient has history of AVMs seen on endoscopy but expressed not wanting further work up at this time.  Parkinson's Disease Patient has rigidity on exam with a resting tremor which is her baseline. Patient continued on home medications.    Other chronic conditions were medically managed with home medications and formulary alternatives as necessary ()  PCP Follow-up Recommendations: Patient has history of ESBL UTI x2, please ask about dysuria, frequency, etc Patient BP normotensive to slightly hypertensive during admission, consider readjusting BP medications Patient has history of small bowel AVMs and was prescribed protonix. Patient ran out of these and was restarted during admission. Follow  up regarding adherence.  Patient has history of hammertoe. Please follow up regarding pain management.

## 2023-03-27 NOTE — Progress Notes (Signed)
I called and spoke with patient's husband, Suly Schricker.  He reports that she passed out in the bathroom.  She lost consciousness for about 20 minutes.  He does not think that she hit her head.  He did not see any seizure-like activity.  He says that she has not been like her usual self for a long time.  She is so weak and that they are struggling to help care for her at home.  They are soon to have a CNA who is going to start coming to the house to help.  During the day, she mostly stays in bed and watches TV.  He says this has been very difficult since she was such an active woman and this has been hard on her emotionally given multiple recent admissions and worsening of her physical state.  Of note, she is no longer taking Eliquis given the bleeding risk.  Her PCP, Dr. Miquel Dunn, had very helpful conversation regarding goals of care and counseling/discussion of anticoagulation. She introduced the concept of palliative care on 01/23/2023. Tonight I reintroduced the concept of palliative care, and both the patient and her husband are amenable to consult while she is here in the hospital.  I reemphasized that palliative care focuses on quality life and treating symptoms, and minimizing unnecessary medications and procedures.   I empathized that she is in a difficult place with her syncopal episodes in the setting of paroxysmal atrial fibrillation, and not being a candidate for the Watchman device while also struggling with Parkinson's disease and progressive decline.  Patient would like to remain full code until she is able to have further discussions with her daughter.  In the morning, we will call her husband and daughter to schedule a meeting for further goals of care discussions. Daughter- Doy Hutching, [phone] Husband- [name], [phone]  Darral Dash, DO PGY-3 [place] [facility]

## 2023-03-27 NOTE — ED Triage Notes (Signed)
Pt to the ed from home with a CC of LOC after having a BM. When EMS arrived on scene pt have systolic BP of 78. Pt was unconscious for about 10 mins. Witnessed by family. Pt relays dizziness without cp, sob.

## 2023-03-27 NOTE — ED Notes (Signed)
This RN prepares to straight cath pt and pt found to be incontinent of urine and stool. Urine is malodorous and stool is dark and tarry. Provider notified and hemoccult order requested.

## 2023-03-27 NOTE — ED Provider Notes (Addendum)
Patient and patient's husband seen after prior EDP.  Patient is adamant about refusing admission.  She desires to be discharged.  Patient's husband appears to be comfortable with this plan for discharge.  Patient offered admission for continued workup and/or observation.  Patient declines this repeatedly.  Screening labs obtained are without significant abnormality.  Patient appears to be at baseline and is comfortable.  Importance of close follow-up was stressed.  Strict return precautions given and understood.  2030 Patient's husband requested check of urine for possible UTI.  While performing in and out catheterization nursing noted that the patient's stool was dark and melanotic.  Stool is Hemoccult positive.  Additionally, obtained urine is cloudy and very suggestive on visual inspection of likely UTI.  Given this information patient is now agreeable with plan to admit.   Wynetta Fines, MD 03/27/23 Izola Price    Wynetta Fines, MD 03/27/23 2053

## 2023-03-27 NOTE — ED Provider Notes (Signed)
[facility] AT [facility] Provider Note   CSN: [id number] Arrival date & time: 03/27/23  1351     History  Chief Complaint  Patient presents with   Loss of Consciousness    Anna Bernard is a 72 y.o. female.   Loss of Consciousness Patient presents syncopal episode.  Reportedly was at home and had a bowel movement.  Then passed out.  Reportedly was hypotensive and was reportedly unconscious for 10 minutes.  Had some dizziness.  No chest pain or shortness of breath.  Has had previous episodes of syncope.  Also has been generally weak.  Has been able to get up with assistance previously but for the last few weeks now has been able to get up even with assistance.  Feeling weak all over.    Past Medical History:  Diagnosis Date   Anemia, unspecified    Carotid artery disease (HCC)    Hyperlipidemia, unspecified    Hypertension    Hypothyroidism, unspecified    Mild CAD    PAF (paroxysmal atrial fibrillation) (HCC)    Pre-diabetes    Pulmonary emboli (HCC)    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 by duplex 2023   Vitamin B12 deficiency     Home Medications Prior to Admission medications   Medication Sig Start Date End Date Taking? Authorizing Provider  Carbidopa-Levodopa ER (SINEMET CR) 25-100 MG tablet controlled release 2 at 9-10am, 2 at 1pm, 2 at 5pm, 1 at 7-8pm 02/14/23   Tat, Rebecca S, DO  levothyroxine (SYNTHROID) 100 MCG tablet NEW PRESCRIPTION REQUEST: TAKE ONE TABLET BY MOUTH DAILY 12/22/22   Billey Co, MD  melatonin 3 MG TABS tablet Take 3 mg by mouth at bedtime as needed (For sleep). 01/24/22   [provider]  metoprolol tartrate (LOPRESSOR) 25 MG tablet NEW PRESCRIPTION REQUEST: TAKE 1/2 TABLET BY MOUTH TWICE DAILY 12/22/22   Billey Co, MD  Multiple Vitamin (MULTIVITAMIN WITH MINERALS) TABS Take 1 tablet by mouth daily with breakfast.    [provider]  oxyCODONE (OXY IR/ROXICODONE) 5 MG  immediate release tablet Take 1 tablet (5 mg total) by mouth every 4 (four) hours as needed for severe pain. 01/17/23   Carrion-Carrero, Karle Starch, MD  pantoprazole (PROTONIX) 40 MG tablet NEW PRESCRIPTION REQUEST: TAKE ONE TABLET BY MOUTH TWICE DAILY 12/22/22   Billey Co, MD  rosuvastatin (CRESTOR) 10 MG tablet Take 1 tablet (10 mg total) by mouth every evening. 03/14/23 03/13/24  Quintella Reichert, MD  senna-docusate (SENOKOT-S) 8.6-50 MG tablet Take 1 tablet by mouth at bedtime. Hold if diarrhea 11/20/22   Albertine Grates, MD  TYLENOL 8 HOUR ARTHRITIS PAIN 650 MG CR tablet Take 650-1,300 mg by mouth every 8 (eight) hours as needed for pain.    [provider]      Allergies    Patient has no known allergies.    Review of Systems   Review of Systems  Cardiovascular:  Positive for syncope.    Physical Exam Updated Vital Signs BP (!) 148/63 (BP Location: Right Arm)   Pulse 74   Temp 98.5 F (36.9 C) (Oral)   Resp 16   Ht 5\' 1"  (1.549 m)   Wt 72 kg   SpO2 96%   BMI 29.99 kg/m  Physical Exam Vitals and nursing note reviewed.  HENT:     Head: Normocephalic.  Cardiovascular:     Rate and Rhythm: Normal rate.  Chest:  Chest wall: No tenderness.  Abdominal:     Tenderness: There is no abdominal tenderness.  Skin:    General: Skin is warm.  Neurological:     Mental Status: She is alert.     Comments: Generally weak.  Awake and answers questions.   Back tomorrow generalized  ED Results / Procedures / Treatments   Labs (all labs ordered are listed, but only abnormal results are displayed) Labs Reviewed  COMPREHENSIVE METABOLIC PANEL  CBC WITH DIFFERENTIAL/PLATELET  TROPONIN I (HIGH SENSITIVITY)    EKG EKG Interpretation Date/Time:  Monday March 27 2023 14:43:37 EDT Ventricular Rate:  74 PR Interval:  154 QRS Duration:  90 QT Interval:  407 QTC Calculation: 452 R Axis:   7  Text Interpretation: Sinus rhythm Consider RVH or posterior infarct Left ventricular  hypertrophy Inferior infarct, old Confirmed by Benjiman Core [phone]) on 03/27/2023 3:16:48 PM  Radiology No results found.  Procedures Procedures    Medications Ordered in ED Medications - No data to display  ED Course/ Medical Decision Making/ A&P                             Medical Decision Making Amount and/or Complexity of Data Reviewed Labs: ordered.   Patient weakness.  Generally.  Has been worsening.  History of Parkinson's disease.  Also history of paroxysmal A-fib.  Reportedly been doing worse.  Will get basic blood work. Syncopal episode.  Reviewing notes has had history of same.  Has had previous cardiology monitoring.  History of A-fib.  Will get basic blood work and reevaluate. Patient's husband is with her and states they cannot manage her at home since she cannot get up even with assistance now.         Final Clinical Impression(s) / ED Diagnoses Final diagnoses:  None    Rx / DC Orders ED Discharge Orders     None         Benjiman Core, MD 03/27/23 [phone]

## 2023-03-28 ENCOUNTER — Encounter (HOSPITAL_COMMUNITY): Payer: Self-pay | Admitting: Family Medicine

## 2023-03-28 ENCOUNTER — Other Ambulatory Visit: Payer: Self-pay

## 2023-03-28 ENCOUNTER — Observation Stay (HOSPITAL_COMMUNITY): Payer: Medicare Other

## 2023-03-28 DIAGNOSIS — R55 Syncope and collapse: Secondary | ICD-10-CM

## 2023-03-28 DIAGNOSIS — I6523 Occlusion and stenosis of bilateral carotid arteries: Secondary | ICD-10-CM | POA: Diagnosis not present

## 2023-03-28 DIAGNOSIS — Z7189 Other specified counseling: Secondary | ICD-10-CM

## 2023-03-28 DIAGNOSIS — Z515 Encounter for palliative care: Secondary | ICD-10-CM

## 2023-03-28 DIAGNOSIS — N390 Urinary tract infection, site not specified: Secondary | ICD-10-CM | POA: Diagnosis not present

## 2023-03-28 DIAGNOSIS — G20A1 Parkinson's disease without dyskinesia, without mention of fluctuations: Secondary | ICD-10-CM

## 2023-03-28 DIAGNOSIS — K922 Gastrointestinal hemorrhage, unspecified: Secondary | ICD-10-CM

## 2023-03-28 LAB — CBC
HCT: 30.8 % — ABNORMAL LOW (ref 36.0–46.0)
HCT: 33 % — ABNORMAL LOW (ref 36.0–46.0)
Hemoglobin: 10.1 g/dL — ABNORMAL LOW (ref 12.0–15.0)
Hemoglobin: 11.1 g/dL — ABNORMAL LOW (ref 12.0–15.0)
MCH: 24.2 pg — ABNORMAL LOW (ref 26.0–34.0)
MCH: 25 pg — ABNORMAL LOW (ref 26.0–34.0)
MCHC: 32.8 g/dL (ref 30.0–36.0)
MCHC: 33.6 g/dL (ref 30.0–36.0)
MCV: 73.9 fL — ABNORMAL LOW (ref 80.0–100.0)
MCV: 74.3 fL — ABNORMAL LOW (ref 80.0–100.0)
Platelets: 214 10*3/uL (ref 150–400)
Platelets: 235 10*3/uL (ref 150–400)
RBC: 4.17 MIL/uL (ref 3.87–5.11)
RBC: 4.44 MIL/uL (ref 3.87–5.11)
RDW: 17 % — ABNORMAL HIGH (ref 11.5–15.5)
RDW: 16.5 % — ABNORMAL HIGH (ref 11.5–15.5)
WBC: 4.4 10*3/uL (ref 4.0–10.5)
WBC: 3.6 10*3/uL — ABNORMAL LOW (ref 4.0–10.5)
nRBC: 0 % (ref 0.0–0.2)
nRBC: 0 % (ref 0.0–0.2)

## 2023-03-28 MED ORDER — CARBIDOPA-LEVODOPA ER 25-100 MG PO TBCR
2.0000 | EXTENDED_RELEASE_TABLET | Freq: Three times a day (TID) | ORAL | Status: DC
  Administered 2023-03-28 – 2023-04-07 (×28): 2 via ORAL
  Filled 2023-03-28 (×34): qty 2

## 2023-03-28 MED ORDER — FOSFOMYCIN TROMETHAMINE 3 G PO PACK
3.0000 g | PACK | Freq: Once | ORAL | Status: AC
  Administered 2023-03-28: 3 g via ORAL
  Filled 2023-03-28: qty 3

## 2023-03-28 MED ORDER — CARBIDOPA-LEVODOPA ER 25-100 MG PO TBCR
2.0000 | EXTENDED_RELEASE_TABLET | Freq: Every day | ORAL | Status: DC
  Administered 2023-03-28: 2 via ORAL
  Filled 2023-03-28: qty 2

## 2023-03-28 MED ORDER — ACETAMINOPHEN 500 MG PO TABS
1000.0000 mg | ORAL_TABLET | Freq: Four times a day (QID) | ORAL | Status: DC | PRN
  Administered 2023-03-28 – 2023-04-06 (×6): 1000 mg via ORAL
  Filled 2023-03-28 (×6): qty 2

## 2023-03-28 MED ORDER — ROSUVASTATIN CALCIUM 5 MG PO TABS
10.0000 mg | ORAL_TABLET | Freq: Every evening | ORAL | Status: DC
  Administered 2023-03-28 – 2023-04-06 (×10): 10 mg via ORAL
  Filled 2023-03-28 (×10): qty 2

## 2023-03-28 MED ORDER — CARBIDOPA-LEVODOPA ER 25-100 MG PO TBCR: 2.0000 | EXTENDED_RELEASE_TABLET | Freq: Every day | ORAL | Status: DC

## 2023-03-28 MED ORDER — LEVOTHYROXINE SODIUM 100 MCG PO TABS
100.0000 ug | ORAL_TABLET | Freq: Every day | ORAL | Status: DC
  Administered 2023-03-29 – 2023-04-07 (×9): 100 ug via ORAL
  Filled 2023-03-28 (×10): qty 1

## 2023-03-28 MED ORDER — CARBIDOPA-LEVODOPA ER 25-100 MG PO TBCR: 2.0000 | EXTENDED_RELEASE_TABLET | ORAL | Status: DC

## 2023-03-28 MED ORDER — CARBIDOPA-LEVODOPA ER 25-100 MG PO TBCR
1.0000 | EXTENDED_RELEASE_TABLET | ORAL | Status: DC
  Administered 2023-03-28 – 2023-04-06 (×10): 1 via ORAL
  Filled 2023-03-28 (×12): qty 1

## 2023-03-28 NOTE — Assessment & Plan Note (Addendum)
UA concerning for UTI (leuks, many bacteria). Question underlying colonization, but given presence of dysuria/infectious symptoms, will treat. Patient has hx of UTI. S/p 1 dose CTX in ED. Treated w/ PO Fosfomycin 3 mg x1 given ESBL producing organisms on urine cultures for the past 4 years (11/17/22, 05/19/22, 01/16/22, 09/02/19) - will continue to monitor sxs

## 2023-03-28 NOTE — ED Notes (Signed)
Family at bedside ready to meet with care team for planning.

## 2023-03-28 NOTE — Assessment & Plan Note (Addendum)
Rigidity on exam with resting tremor. - Continue carbodopa-levodopa. Per neurology, trialed off of medication and patient felt significantly worse

## 2023-03-28 NOTE — Evaluation (Signed)
Physical Therapy Evaluation Patient Details Name: Anna Bernard MRN: [id number] DOB: December 29, 1950 Today's Date: 03/28/2023  History of Present Illness  72 y.o. female presents to [facility] on 03/27/2023 after syncopal episode. Pt found to have UTI. PMH includes recurrent syncopal episodes, Parkinson disease, PAF, PE, CAD, HTN, prediabetes.  Clinical Impression  Pt presents to PT with deficits in functional mobility, tone, balance, strength, power, cognition. Pt has significant BLE extensor tone, resulting in difficulty flexing limbs during mobility and impairing functional mobility and transfer quality. Pt also with tendency for R lateral lean during session, core strength is insufficient to correct. Pt's spouse reports the pt had been able to transfer and ambulate short distances with assistance until recent decline. PT recommends short term inpatient PT services in an effort to reduce falls risk and caregiver burden with household mobility.         Assistance Recommended at Discharge Frequent or constant Supervision/Assistance  If plan is discharge home, recommend the following:  Can travel by private vehicle  Two people to help with walking and/or transfers;A lot of help with bathing/dressing/bathroom;Assistance with cooking/housework;Direct supervision/assist for medications management;Direct supervision/assist for financial management;Assist for transportation;Help with stairs or ramp for entrance   No    Equipment Recommendations Hospital bed (hoyer lift)  Recommendations for Other Services       Functional Status Assessment Patient has had a recent decline in their functional status and demonstrates the ability to make significant improvements in function in a reasonable and predictable amount of time.     Precautions / Restrictions Precautions Precautions: Fall Precaution Comments: Parkinson disease with history of syncope Restrictions Weight Bearing Restrictions: No       Mobility  Bed Mobility Overal bed mobility: Needs Assistance Bed Mobility: Supine to Sit, Sit to Supine     Supine to sit: Max assist, HOB elevated Sit to supine: Max assist        Transfers Overall transfer level: Needs assistance Equipment used: 2 person hand held assist Transfers: Sit to/from Stand Sit to Stand: Max assist, From elevated surface           General transfer comment: BUE support with R knee block from elevated stretcher    Ambulation/Gait                  Stairs            Wheelchair Mobility     Tilt Bed    Modified Rankin (Stroke Patients Only)       Balance Overall balance assessment: Needs assistance Sitting-balance support: Single extremity supported, Bilateral upper extremity supported, Feet unsupported Sitting balance-Leahy Scale: Poor Sitting balance - Comments: minA, pt with tendency for R lateral lean Postural control: Right lateral lean   Standing balance-Leahy Scale: Poor Standing balance comment: maxA, tendency for posterior lean                             Pertinent Vitals/Pain Pain Assessment Pain Assessment: Faces Faces Pain Scale: Hurts little more Pain Location: generalized discomfort Pain Descriptors / Indicators: Grimacing Pain Intervention(s): Monitored during session    Home Living Family/patient expects to be discharged to:: Private residence Living Arrangements: Spouse/significant other Available Help at Discharge: Family;Available 24 hours/day Type of Home: House Home Access: Ramped entrance       Home Layout: Two level;Full bath on main level;Able to live on main level with bedroom/bathroom Home Equipment: Rollator (4 wheels);Rolling Walker (2 wheels);BSC/3in1;Shower  seat;Wheelchair - manual      Prior Function Prior Level of Function : Needs assist             Mobility Comments: spouse reports pt requiring assistance for bed mobility and transfers, was able to walk  short distances with UE support and assistance but has recently been reliant on being pushed in rollator around the home ADLs Comments: assist with bathing, intermittent assist with dressing, dependent for IADLs     Hand Dominance   Dominant Hand: Right    Extremity/Trunk Assessment   Upper Extremity Assessment Upper Extremity Assessment: Defer to OT evaluation    Lower Extremity Assessment Lower Extremity Assessment: RLE deficits/detail;LLE deficits/detail RLE Deficits / Details: pt with limited knee flexion and ankle DF, PT notes increased extensor tone bilaterally. Strength 3/5 through available range. LLE Deficits / Details: pt with limited knee flexion and ankle DF, PT notes increased extensor tone bilaterally. Strength 3/5 through available range.    Cervical / Trunk Assessment Cervical / Trunk Assessment: Kyphotic  Communication   Communication:  (soft spoken at times)  Cognition Arousal/Alertness: Awake/alert Behavior During Therapy: Flat affect Overall Cognitive Status: Impaired/Different from baseline Area of Impairment: Orientation, Attention, Memory, Following commands, Safety/judgement, Awareness, Problem solving                 Orientation Level: Disoriented to, Time, Situation Current Attention Level: Sustained Memory: Decreased short-term memory Following Commands: Follows one step commands with increased time Safety/Judgement: Decreased awareness of safety, Decreased awareness of deficits Awareness: Intellectual Problem Solving: Slow processing, Decreased initiation, Difficulty sequencing          General Comments General comments (skin integrity, edema, etc.): VSS on RA    Exercises General Exercises - Lower Extremity Ankle Circles/Pumps: AROM, Both, 10 reps Heel Slides: AAROM, Both, 10 reps   Assessment/Plan    PT Assessment Patient needs continued PT services  PT Problem List Decreased strength;Decreased activity tolerance;Decreased  balance;Decreased mobility;Decreased knowledge of use of DME;Decreased cognition;Impaired tone       PT Treatment Interventions DME instruction;Functional mobility training;Therapeutic activities;Therapeutic exercise;Balance training;Neuromuscular re-education;Patient/family education;Wheelchair mobility training    PT Goals (Current goals can be found in the Care Plan section)  Acute Rehab PT Goals Patient Stated Goal: to improve functional mobility quality, reduce caregiver burden PT Goal Formulation: With patient Time For Goal Achievement: 04/11/23 Potential to Achieve Goals: Fair    Frequency Min 3X/week     Co-evaluation               AM-PAC PT "6 Clicks" Mobility  Outcome Measure Help needed turning from your back to your side while in a flat bed without using bedrails?: A Lot Help needed moving from lying on your back to sitting on the side of a flat bed without using bedrails?: A Lot Help needed moving to and from a bed to a chair (including a wheelchair)?: A Lot Help needed standing up from a chair using your arms (e.g., wheelchair or bedside chair)?: A Lot Help needed to walk in hospital room?: Total Help needed climbing 3-5 steps with a railing? : Total 6 Click Score: 10    End of Session   Activity Tolerance: Patient tolerated treatment well Patient left: in bed;with call bell/phone within reach;with family/visitor present Nurse Communication: Mobility status;Need for lift equipment PT Visit Diagnosis: Other abnormalities of gait and mobility (R26.89);Muscle weakness (generalized) (M62.81);Other symptoms and signs involving the nervous system (Z61.096)    Time: 0454-0981 PT Time Calculation (min) (ACUTE  ONLY): 25 min   Charges:   PT Evaluation $PT Eval Low Complexity: 1 Low   PT General Charges $$ ACUTE PT VISIT: 1 Visit         Arlyss Gandy, PT, DPT Acute Rehabilitation Office [phone]   Arlyss Gandy 03/28/2023, 12:13 PM

## 2023-03-28 NOTE — Consult Note (Signed)
Consultation Note Date: 03/28/2023   Patient Name: Anna Bernard  DOB: 1951/07/11  MRN: [id number]  Age / Sex: 72 y.o., female  PCP: Billey Co, MD Referring Physician: Carney Living, MD  Reason for Consultation: Establishing goals of care  HPI/Patient Profile: 72 y.o. female  with past medical history of Parkinson's disease, A-fib, AVMs, PE admitted on 03/27/2023 with syncopal episode.   Patient's syncopal event is recurring and most likely related to balance issues and rigidity from Parkinson's disease.  She has been interested in discussing her care further with her family, including CODE STATUS.  PMT has been consulted to assist with goals of care conversation.  Clinical Assessment and Goals of Care:  I have reviewed medical records including EPIC notes, labs and imaging, assessed the patient and then met at the bedside with patient's husband to discuss diagnosis prognosis, GOC, EOL wishes, disposition and options.  I introduced Palliative Medicine as specialized medical care for people living with serious illness. It focuses on providing relief from the symptoms and stress of a serious illness. The goal is to improve quality of life for both the patient and the family.  We discussed a brief life review of the patient and then focused on their current illness.   I attempted to elicit values and goals of care important to the patient.    Medical History Review and Understanding:  Family shares that patient was diagnosed with Parkinson's disease around 2 and half years ago.  She has had gradually worsening health since that time.  We discussed her acute illness including UTI, concern for GI bleed, and CT head results negative for acute stroke or other injury.  Discussed concern that there may be no long-term solution for her declining health and syncope, counseling that these recurrent episodes may  be related to disease progression of Parkinson's.  Social History: Patient is married and has a daughter.  She was previously very active and now feels like a "prisoner in her body."  She is spiritual and previously attended church regularly, though this has been difficult lately.  Functional and Nutritional State: Family describes ongoing functional decline for at least a couple of years.  Patient requires assistance with ADLs including dressing and bathing.  She uses a PureWick at home.  Patient describes her appetite as fine and unaffected by her illness.  Albumin of 2.9 noted.  She ambulates very little and uses a walker when she does.  She occasionally falls.  She is essentially bedridden despite efforts at trying to work with therapy.  Palliative Symptoms: Tremors  Advance Directives: A detailed discussion regarding advanced directives was had.  No documentation currently on file.  Code Status: Concepts specific to code status, artifical feeding and hydration, and rehospitalization were considered and discussed. Recommended consideration of DNR status, understanding evidenced-based poor outcomes in similar hospitalized patients, as the cause of the arrest is likely associated with chronic/terminal disease rather than a reversible acute cardio-pulmonary event.    Discussion: Patient's husband is able to participate at the bedside and her daughter participates via speaker phone.  Patient is most worried about her mobility and whether she will be able to get around again.  Family is also concerned about her ongoing decline and what additional efforts can be made to assist her.  They are hopeful that she can still turn things around and improve, though aware that Parkinson's is an incurable disease process. Provided additional education with focus on signs of disease progression.  Husband's sister works with a Editor, commission[employer]) and was planning to begin caregiver assistance on  Monday Wednesday and Friday. Family will make every effort to safely take her home if they must, thought also interested in SNF temporarily. Patient's family were surprised to hear that hospice may be one of her options at this time.  They hope that she is not at that point, as they are not ready to "give up." Encouraged consideration of patient's quality of life as the priority, reassuring patient and family that hospice is not giving up but rather changing the priority.  We discussed that they may make decisions regarding how to focus her care based on her goals and whether she is suffering excessively despite treatment efforts.  Patient does not feel that she is currently suffering excessively and agrees with preference of ongoing therapy for now.  She finds this conversation very helpful and understands her quality of life may change in the future, requiring additional conversations.  She would also like more time to talk with her family about CODE STATUS.  Her husband is DNR, however he wants her to make this decision for herself.   The difference between aggressive medical intervention and comfort care was considered in light of the patient's goals of care. Hospice and Palliative Care services outpatient were explained and offered.   Discussed the importance of continued conversation with family and the medical providers regarding overall plan of care and treatment options, ensuring decisions are within the context of the patient's values and GOCs.   Questions and concerns were addressed.  Hard Choices booklet left for review. The family was encouraged to call with questions or concerns.  PMT will continue to support holistically.   SUMMARY OF RECOMMENDATIONS   -Continue full code/full scope treatment -Patient and family are hopeful for improvement and trial of therapy with SNF rehab; They are also open to home health -Patient is uncertain about CODE STATUS, wishes to discuss further with her  family before making a decision -Ongoing goals of care discussions -TOC consulted for referral to outpatient palliative care -Psychosocial and emotional support provided -PMT will continue to follow and support  Prognosis:  Poor long-term prognosis  Discharge Planning: To Be Determined      Primary Diagnoses: Present on Admission:  Syncope  Pulmonary embolus (HCC)  Paroxysmal atrial fibrillation (HCC)  Parkinson disease  ESBL (extended spectrum beta-lactamase) producing bacteria infection   Physical Exam Vitals and nursing note reviewed.  Constitutional:      General: She is not in acute distress.    Interventions: Nasal cannula in place.  Cardiovascular:     Rate and Rhythm: Normal rate.  Pulmonary:     Effort: Pulmonary effort is normal. No respiratory distress.  Neurological:     Mental Status: She is alert. Mental status is at baseline.  Psychiatric:        Mood and Affect: Affect is flat.        Behavior: Behavior normal.    Vital Signs: BP (!) 159/63   Pulse 82   Temp 98 F (36.7 C) (Oral)   Resp 15   Ht 5\' 1"  (1.549 m)   Wt 72 kg   SpO2 98%   BMI 29.99 kg/m  Pain Scale: 0-10   Pain Score: 0-No pain   SpO2: SpO2: 98 % O2 Device:SpO2: 98 % O2 Flow Rate: .    Palliative Assessment/Data: 40%     MDM: High   Sora Vrooman Jeni Salles, PA-C  Palliative Medicine Team Team phone # [phone]  Thank you for allowing the Palliative Medicine Team to assist in the care of this patient. Please utilize secure chat with additional questions, if there is no response within 30 minutes please call the above phone number.  Palliative Medicine Team providers are available by phone from 7am to 7pm daily and can be reached through the team cell phone.  Should this patient require assistance outside of these hours, please call the patient's attending physician.

## 2023-03-28 NOTE — ED Notes (Signed)
Lunch ordered 

## 2023-03-28 NOTE — Assessment & Plan Note (Addendum)
Hx PE. Not on anticoagulation given bleeding risk.  - SCDs

## 2023-03-28 NOTE — Progress Notes (Signed)
Daily Progress Note Intern Pager: [phone]  Patient name: Anna Bernard Medical record number: [id number] Date of birth: [birth year] Age: 72 y.o. Gender: female  Primary Care Provider: Billey Co, MD Consultants: Palliative Code Status: Full Code   Pt Overview and Major Events to Date:  7/8- patient admitted, started abx for UTI  7/9- + FOB, discussion with palliative regarding GOC   Assessment and Plan: Anna Bernard is a 72 y.o. female presenting with syncopal episode yesterday after having a BM. In the ED she was also found to have UTI with gross hematuria and dark, melanotic stool. Hemoglobin was stable and patient has hx of AVM. Differential for presentation of this includes Chronic GI bleed, Vasovagal, Dehydration, less likely Malignancy   . Pertinent PMH/PSH includes   HTN, HLD, parkinsons, PAF  Hospital Problem List      Hospital     * (Principal) Syncope     A&O x2 on exam to self, location but not year (says 76) Chronic problem with multiple admissions this year Likely multifactorial- general progressive decline, Parkinson's disease,  sedentary with orthostasis Reassuringly, Hgb stable at 11 despite melanotic stools. ? GI bleed with  history of AVMs seen on endoscopy on 4/28 in small bowel - For concern for less likely but possible GI bleed, place 2 large bore  IVs and start IV protonix 40 mg BID - Orthostatic VS - IVF hydration overnight - PT/OT - NPO in case of GI desire for endoscopy - Fall and delirium precautions        Parkinson disease     Rigidity on exam with resting tremor. - Continue carbodopa-levodopa. Per neurology, trialed off of medication  and patient felt significantly worse        Paroxysmal atrial fibrillation (HCC)     NSR on exam. - Cardiac monitoring - Continue metoprolol 12.5 mg BID, although hesitant given orthostasis but  per conversation with cardiology outpatient, will continue for now        Pulmonary embolus  (HCC)     Hx PE. Not on anticoagulation given bleeding risk. - SCDs        ESBL (extended spectrum beta-lactamase) producing bacteria infection     UA concerning for UTI (leuks, many bacteria). Question underlying  colonization, but given presence of dysuria/infectious symptoms, will  treat. - S/p 1 dose CTX in ED, treat w/ PO Fosfomycin 3 mg x1 given ESBL  producing organisms on urine cultures for the past 4 years (11/17/22,  05/19/22, 01/16/22, 09/02/19)        Goals of care, counseling/discussion     Patient and husband amenable to GOC discussion with palliative.  Broached the concept of symptomatic treatment and minmizing aggressive  interventions/frequent hospitalizations. - Palliative consult placed - FULL code for now pending further discussions       FEN/GI: Full Diet PPx: SCDs Dispo:Discussion with family and palliative. Patient would like to go home.   Subjective:  Patient was seen in ED. States she was here because of having a UTI and because her family brought her here. She states she has no real concerns but would like to go home. Patient denies nausea, vomiting. Patient states she does not have blood in stool but says maybe the nurses saw something.   Objective: Temp:  [98.5 F (36.9 C)-99 F (37.2 C)] 98.5 F (36.9 C) (07/09 0448) Pulse Rate:  [72-80] 77 (07/09 0600) Resp:  [14-28] 28 (07/09 0600) BP: (124-165)/(37-83) 125/49 (07/09 0600) SpO2:  [  95 %-99 %] 99 % (07/09 0600) Weight:  [72 kg] 72 kg (07/08 1420) Physical Exam:  Physical Exam Constitutional:      Comments: frail  HENT:     Head: Atraumatic.  Eyes:     Extraocular Movements: Extraocular movements intact.     Pupils: Pupils are equal, round, and reactive to light.  Cardiovascular:     Rate and Rhythm: Normal rate and regular rhythm.  Pulmonary:     Effort: Pulmonary effort is normal.     Breath sounds: Normal breath sounds.  Abdominal:     General: Bowel sounds are normal.   Neurological:     Mental Status: She is alert.     Comments: Oriented to self but not to location and time.      Laboratory: Most recent CBC Lab Results  Component Value Date   WBC 4.4 03/28/2023   HGB 10.1 (L) 03/28/2023   HCT 30.8 (L) 03/28/2023   MCV 73.9 (L) 03/28/2023   PLT 214 03/28/2023   Most recent BMP    Latest Ref Rng & Units 03/27/2023    2:30 PM  BMP  Glucose 70 - 99 mg/dL 160   BUN 8 - 23 mg/dL 13   Creatinine 1.09 - 1.00 mg/dL 3.23   Sodium 557 - 322 mmol/L 139   Potassium 3.5 - 5.1 mmol/L 3.9   Chloride 98 - 111 mmol/L 105   CO2 22 - 32 mmol/L 20   Calcium 8.9 - 10.3 mg/dL 8.1       Imaging/Diagnostic Tests: CT head showed no acute intracranial process with some chronic ischemic changes  Tesa Meadors Georg Ruddle, MD 03/28/2023, 7:14 AM  PGY-1, [facility] FPTS Intern pager: [phone], text pages welcome Secure chat group [facility] Teaching Service

## 2023-03-28 NOTE — Assessment & Plan Note (Addendum)
Patient and husband amenable to GOC discussion with palliative. Broached the concept of symptomatic treatment and minmizing aggressive interventions/frequent hospitalizations. Palliative on board, see note.  - Palliative to rediscuss code status and goals of care tomorrow with family

## 2023-03-28 NOTE — ED Notes (Signed)
ED TO INPATIENT HANDOFF REPORT  ED Nurse Name and Phone #: Nicholos Johns [phone]  S Name/Age/Gender Anna Bernard 72 y.o. female Room/Bed: 046C/046C  Code Status   Code Status: Full Code  Home/SNF/Other Skilled nursing facility Patient oriented to: self, place, time, and situation Is this baseline? Yes   Triage Complete: Triage complete  Chief Complaint Syncope [R55]  Triage Note Pt to the ed from home with a CC of LOC after having a BM. When EMS arrived on scene pt have systolic BP of 78. Pt was unconscious for about 10 mins. Witnessed by family. Pt relays dizziness without cp, sob.      Allergies No Known Allergies  Level of Care/Admitting Diagnosis ED Disposition     ED Disposition  Admit   Condition  --   Comment  Hospital Area: [facility] [[id number]]  Level of Care: Progressive [102]  Admit to Progressive based on following criteria: NEUROLOGICAL AND NEUROSURGICAL complex patients with significant risk of instability, who do not meet ICU criteria, yet require close observation or frequent assessment (< / = every 2 - 4 hours) with medical / nursing intervention.  May place patient in observation at [facility] or [facility] if equivalent level of care is available:: No  Covid Evaluation: Asymptomatic - no recent exposure (last 10 days) testing not required  Diagnosis: Syncope [206001]  Admitting Physician: Cyndia Skeeters [[id number]]  Attending Physician: Carney Living [[id number]]          B Medical/Surgery History Past Medical History:  Diagnosis Date   Anemia, unspecified    Carotid artery disease (HCC)    Hyperlipidemia, unspecified    Hypertension    Hypothyroidism, unspecified    Mild CAD    PAF (paroxysmal atrial fibrillation) (HCC)    Pre-diabetes    Pulmonary emboli (HCC)    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 by duplex 2023   Vitamin B12 deficiency    Past Surgical History:  Procedure Laterality Date    ABDOMINAL HYSTERECTOMY     BIOPSY  01/13/2023   Procedure: BIOPSY;  Surgeon: Jeani Hawking, MD;  Location: [facility] ENDOSCOPY;  Service: Gastroenterology;;   COLONOSCOPY WITH PROPOFOL N/A 01/15/2023   Procedure: COLONOSCOPY WITH PROPOFOL;  Surgeon: Tressia Danas, MD;  [facility] END[facility]COPY;  Service: Gastroenterology;  Laterality: N/A;   ENTEROSCOPY N/A 11/18/2022   Procedure: ENTEROSCOPY;  Surgeon: Jeani Hawking, MD;  Location: [facility] ENDOSCOPY;  Service: Gastroenterology;  Laterality: N/A;   ENTEROSCOPY N/A 01/13/2023   Procedure: ENTEROSCOPY;  Surgeon: Jeani Hawking, MD;  Location: [facility] ENDOSCOPY;  Service: Gastroenterology;  Laterality: N/A;   GIVENS CAPSULE STUDY N/A 04/16/2018   Procedure: GIVENS CAPSULE STUDY;  Surgeon: Jeani Hawking, MD;  Location: [facility] ENDOSCOPY;  Service: Endoscopy;  Laterality: N/A;   GIVENS CAPSULE STUDY  01/15/2023   Procedure: GIVENS CAPSULE STUDY;  Surgeon: Tressia Danas, MD;  Location: [facility] ENDOSCOPY;  Service: Gastroenterology;;   HOT HEMOSTASIS N/A 11/18/2022   Procedure: HOT HEMOSTASIS (ARGON PLASMA COAGULATION/BICAP);  Surgeon: Jeani Hawking, MD;  Location: [facility] ENDOSCOPY;  Service: Gastroenterology;  Laterality: N/A;   PARS PLANA VITRECTOMY Right 08/16/2020   Procedure: 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;  Surgeon: Carmela Rima, MD;  Location: [facility] OR;  Service: Ophthalmology;  Laterality: Right;   PARS PLANA VITRECTOMY Right 09/29/2020   Procedure: PARS PLANA VITRECTOMY WITH 25 GAUGE - ENDOCAUTRY;  Surgeon: Carmela Rima, MD;  Location: [facility] OR;  Service: Ophthalmology;  Laterality: Right;  PHOTOCOAGULATION WITH LASER Right 08/16/2020   Procedure: PHOTOCOAGULATION WITH LASER;  Surgeon: Carmela Rima, MD;  Location: [facility] OR;  Service: Ophthalmology;  Laterality: Right;   PHOTOCOAGULATION WITH LASER Right 09/29/2020   Procedure: PHOTOCOAGULATION WITH LASER;  Surgeon: Carmela Rima, MD;  Location: [facility] OR;  Service: Ophthalmology;   Laterality: Right;   POLYPECTOMY  01/15/2023   Procedure: POLYPECTOMY;  Surgeon: Tressia Danas, MD;  Location: [facility] ENDOSCOPY;  Service: Gastroenterology;;   SHOULDER SURGERY     TONSILLECTOMY     TUBAL LIGATION       A IV Location/Drains/Wounds Patient Lines/Drains/Airways Status     Active Line/Drains/Airways     Name Placement date Placement time Site Days   Peripheral IV 03/27/23 20 G Right Antecubital 03/27/23  1910  Antecubital  1   External Urinary Catheter 03/28/23  0943  --  less than 1   Wound / Incision (Open or Dehisced) 05/24/22 Non-pressure wound;Other (Comment) Groin Right wound bed pink 05/24/22  1600  Groin  308   Wound / Incision (Open or Dehisced) 05/24/22 Non-pressure wound Groin Right wound bed pink 05/24/22  1600  Groin  308   Wound / Incision (Open or Dehisced) 01/13/23 Toe (Comment  which one) Right;Distal Surgical pinning 01/13/23  0000  Toe (Comment  which one)  74            Intake/Output Last 24 hours  Intake/Output Summary (Last 24 hours) at 03/28/2023 1558 Last data filed at 03/28/2023 0815 Gross per 24 hour  Intake 776.4 ml  Output 10 ml  Net 766.4 ml    Labs/Imaging Results for orders placed or performed during the hospital encounter of 03/27/23 (from the past 48 hour(s))  Comprehensive metabolic panel     Status: Abnormal   Collection Time: 03/27/23  2:30 PM  Result Value Ref Range   Sodium 139 135 - 145 mmol/L   Potassium 3.9 3.5 - 5.1 mmol/L   Chloride 105 98 - 111 mmol/L   CO2 20 (L) 22 - 32 mmol/L   Glucose, Bld 120 (H) 70 - 99 mg/dL    Comment: Glucose reference range applies only to samples taken after fasting for at least 8 hours.   BUN 13 8 - 23 mg/dL   Creatinine, Ser 1.61 0.44 - 1.00 mg/dL   Calcium 8.1 (L) 8.9 - 10.3 mg/dL   Total Protein 6.8 6.5 - 8.1 g/dL   Albumin 2.9 (L) 3.5 - 5.0 g/dL   AST 19 15 - 41 U/L   ALT 6 0 - 44 U/L   Alkaline Phosphatase 55 38 - 126 U/L   Total Bilirubin 0.5 0.3 - 1.2 mg/dL   GFR,  Estimated >09 >60 mL/min    Comment: (NOTE) Calculated using the CKD-EPI Creatinine Equation (2021)    Anion gap 14 5 - 15    Comment: Performed at [facility], [address], [place], [place] [address]  Troponin I (High Sensitivity)     Status: None   Collection Time: 03/27/23  2:30 PM  Result Value Ref Range   Troponin I (High Sensitivity) 7 <18 ng/L    Comment: (NOTE) Elevated high sensitivity troponin I (hsTnI) values and significant  changes across serial measurements may suggest ACS but many other  chronic and acute conditions are known to elevate hsTnI results.  Refer to the "Links" section for chest pain algorithms and additional  guidance. Performed at [facility], [address], [place], [place] [address]   CBC  with Differential     Status: Abnormal   Collection Time: 03/27/23  2:30 PM  Result Value Ref Range   WBC 7.6 4.0 - 10.5 K/uL   RBC 4.49 3.87 - 5.11 MIL/uL   Hemoglobin 11.2 (L) 12.0 - 15.0 g/dL   HCT 16.1 (L) 09.6 - 04.5 %   MCV 76.6 (L) 80.0 - 100.0 fL   MCH 24.9 (L) 26.0 - 34.0 pg   MCHC 32.6 30.0 - 36.0 g/dL   RDW 40.9 (H) 81.1 - 91.4 %   Platelets 223 150 - 400 K/uL   nRBC 0.0 0.0 - 0.2 %   Neutrophils Relative % 84 %   Neutro Abs 6.3 1.7 - 7.7 K/uL   Lymphocytes Relative 11 %   Lymphs Abs 0.8 0.7 - 4.0 K/uL   Monocytes Relative 5 %   Monocytes Absolute 0.4 0.1 - 1.0 K/uL   Eosinophils Relative 0 %   Eosinophils Absolute 0.0 0.0 - 0.5 K/uL   Basophils Relative 0 %   Basophils Absolute 0.0 0.0 - 0.1 K/uL   Immature Granulocytes 0 %   Abs Immature Granulocytes 0.03 0.00 - 0.07 K/uL    Comment: Performed at [facility], [address] [place], [place] [address]  Troponin I (High Sensitivity)     Status: None   Collection Time: 03/27/23  4:37 PM  Result Value Ref Range   Troponin I (High Sensitivity) 8 <18 ng/L    Comment: (NOTE) Elevated high sensitivity troponin I (hsTnI) values and significant  changes across serial  measurements may suggest ACS but many other  chronic and acute conditions are known to elevate hsTnI results.  Refer to the "Links" section for chest pain algorithms and additional  guidance. Performed at [facility], [address], [place], [place] [address]   Urinalysis, w/ Reflex to Culture (Infection Suspected) -Urine, Catheterized     Status: Abnormal   Collection Time: 03/27/23  8:24 PM  Result Value Ref Range   Specimen Source URINE, CATHETERIZED    Color, Urine AMBER (A) YELLOW    Comment: BIOCHEMICALS MAY BE AFFECTED BY COLOR   APPearance CLOUDY (A) CLEAR   Specific Gravity, Urine 1.014 1.005 - 1.030   pH 5.0 5.0 - 8.0   Glucose, UA NEGATIVE NEGATIVE mg/dL   Hgb urine dipstick NEGATIVE NEGATIVE   Bilirubin Urine NEGATIVE NEGATIVE   Ketones, ur 20 (A) NEGATIVE mg/dL   Protein, ur 865 (A) NEGATIVE mg/dL   Nitrite NEGATIVE NEGATIVE   Leukocytes,Ua LARGE (A) NEGATIVE   RBC / HPF 11-20 0 - 5 RBC/hpf   WBC, UA >50 0 - 5 WBC/hpf    Comment:        Reflex urine culture not performed if WBC <=10, OR if Squamous epithelial cells >5. If Squamous epithelial cells >5 suggest recollection.    Bacteria, UA MANY (A) NONE SEEN   Squamous Epithelial / HPF 0-5 0 - 5 /HPF   WBC Clumps PRESENT    Mucus PRESENT     Comment: Performed at [facility], [address], [place], [place] [address]  Urine Culture     Status: None (Preliminary result)   Collection Time: 03/27/23  8:24 PM   Specimen: Urine, Catheterized  Result Value Ref Range   Specimen Description URINE, CATHETERIZED    Special Requests      NONE Reflexed from [id number] Performed at [facility], [address] [place], [place] [address]    Culture  PENDING    Report Status PENDING   POC occult blood, ED RN will collect     Status: Abnormal   Collection Time: 03/27/23  8:48 PM  Result Value Ref Range   Fecal Occult Bld POSITIVE (A) NEGATIVE  Type and screen [facility]     Status:  None   Collection Time: 03/27/23  9:40 PM  Result Value Ref Range   ABO/RH(D) O POS    Antibody Screen NEG    Sample Expiration      03/30/2023,2359 Performed at [facility], [address], [place], [place] [address]   CBC     Status: Abnormal   Collection Time: 03/28/23  5:25 AM  Result Value Ref Range   WBC 4.4 4.0 - 10.5 K/uL   RBC 4.17 3.87 - 5.11 MIL/uL   Hemoglobin 10.1 (L) 12.0 - 15.0 g/dL   HCT 40.1 (L) 02.7 - 25.3 %   MCV 73.9 (L) 80.0 - 100.0 fL   MCH 24.2 (L) 26.0 - 34.0 pg   MCHC 32.8 30.0 - 36.0 g/dL   RDW 66.4 (H) 40.3 - 47.4 %   Platelets 214 150 - 400 K/uL   nRBC 0.0 0.0 - 0.2 %    Comment: Performed at [facility], [address] [place], [place] [address]   CT HEAD WO CONTRAST  Result Date: 03/28/2023 CLINICAL DATA:  Syncope/presyncope. EXAM: CT HEAD WITHOUT CONTRAST TECHNIQUE: Contiguous axial images were obtained from the base of the skull through the vertex without intravenous contrast. RADIATION DOSE REDUCTION: This exam was performed according to the departmental dose-optimization program which includes automated exposure control, adjustment of the mA and/or kV according to patient size and/or use of iterative reconstruction technique. COMPARISON:  Head CT 11/17/2022.  MRI brain 11/17/2022. FINDINGS: Brain: No evidence of acute infarction, hemorrhage, hydrocephalus, extra-axial collection or mass lesion/mass effect. Mild diffuse atrophy and mild periventricular white matter hypodensity appears similar to the prior study. Vascular: Atherosclerotic calcifications are present within the cavernous internal carotid arteries. Skull: Normal. Negative for fracture or focal lesion. Sinuses/Orbits: There is a small air-fluid level in the left sphenoid sinus. There is mucosal thickening of ethmoid air cells. The orbits are within normal limits. Other: None. IMPRESSION: 1. No acute intracranial process. 2. Stable mild atrophy and chronic small vessel ischemic  changes. 3. Small air-fluid level in the left sphenoid sinus. Correlate for acute sinusitis. Electronically Signed   By: Darliss Cheney M.D.   On: 03/28/2023 02:03    Pending Labs Unresulted Labs (From admission, onward)     Start     Ordered   03/28/23 1500  CBC  Once,   R        03/28/23 1108            Vitals/Pain Today's Vitals   03/28/23 1430 03/28/23 1445 03/28/23 1500 03/28/23 1515  BP:   128/65   Pulse: 76 76 75 75  Resp: 19 15 18 15   Temp:      TempSrc:      SpO2: 99% 99% 99% 98%  Weight:      Height:      PainSc:        Isolation Precautions No active isolations  Medications Medications  ondansetron (ZOFRAN-ODT) 4 MG disintegrating tablet (  Canceled Entry 03/27/23 2003)  metoprolol tartrate (LOPRESSOR) tablet 12.5 mg (12.5 mg Oral Given 03/28/23 1002)  0.9 %  sodium chloride infusion ( Intravenous Infusion Verify 03/28/23 0815)  pantoprazole (PROTONIX) injection 40 mg (40 mg Intravenous Given 03/28/23 1002)  levothyroxine (SYNTHROID) tablet 100 mcg (has no administration in time range)  rosuvastatin (CRESTOR) tablet 10 mg (has no administration in time range)  acetaminophen (TYLENOL) tablet 1,000 mg (1,000 mg Oral Given 03/28/23 1131)  Carbidopa-Levodopa ER (SINEMET CR) 25-100 MG tablet controlled release 1 tablet (has no administration in time range)  Carbidopa-Levodopa ER (SINEMET CR) 25-100 MG tablet controlled release 2 tablet (has no administration in time range)  cefTRIAXone (ROCEPHIN) 1 g in sodium chloride 0.9 % 100 mL IVPB (0 g Intravenous Stopped 03/27/23 2234)  Carbidopa-Levodopa ER (SINEMET CR) 25-100 MG tablet controlled release 1 tablet (1 tablet Oral Given 03/28/23 0059)  fosfomycin (MONUROL) packet 3 g (3 g Oral Given 03/28/23 0137)    Mobility walks with device     Focused Assessments Cardiac Assessment Handoff:  Cardiac Rhythm: Normal sinus rhythm (HR 80) No results found for: "CKTOTAL", "CKMB", "CKMBINDEX", "TROPONINI" Lab Results  Component  Value Date   DDIMER 0.52 (H) 11/17/2022   Does the Patient currently have chest pain? No    R Recommendations: See Admitting Provider Note  Report given to:   Additional Notes: .

## 2023-03-28 NOTE — Assessment & Plan Note (Addendum)
A&O x1 on exam to self, but not to location or year (says 33) Chronic problem with multiple admissions this year Likely multifactorial- general progressive decline, Parkinson's disease, sedentary with orthostasis Reassuringly, Hgb stable at 11 despite melanotic stools. ? GI bleed with history of AVMs seen on endoscopy on 4/28 in small bowel. S/p IVF overnight. Discussion with family regarding plans of care involve going home regarding this issue.  - rediscuss with palliative and family regarding following up outpatient.  - continue protonix  - PT/OT - Fall and delirium precautions

## 2023-03-28 NOTE — Progress Notes (Signed)
Met with daughter and husband of patient to discuss plan to keep patient overnight in order to facilitate a goals of care discussion with palliative tomorrow. Husband stayed he is currently taking care of patient alone with home health coming 2-3 times a week. He states insurance won't cover this because they make too much and he must pay out of pocket. Daughter and husband both endorse patient is having increased trouble with ambulatin g and daily activities. Husband states he had discussion with her long ago regarding code status but feel it is worthwhile to revisit that conversation. Also discussed goals of care with family. Palliative team on board and will have conversation with patient and family tomorrow. Patient is adamant about leaving the hospital. Goals for discussion tomorrow include revisiting code status, discussing goals of care in regards to GI work up  for ? Chronic GI bleed, and disposition. As patient has hx of GI AVM, she may be hospitalized at a later time for similar presentation- this it is important to discuss what interventions patient would like in that case.

## 2023-03-28 NOTE — Assessment & Plan Note (Signed)
NSR on exam. - Cardiac monitoring - Continue metoprolol 12.5 mg BID, although hesitant given orthostasis but per conversation with cardiology outpatient, will continue for now

## 2023-03-28 NOTE — Plan of Care (Signed)

## 2023-03-28 NOTE — Evaluation (Signed)
Occupational Therapy Evaluation Patient Details Name: Anna Bernard MRN: [id number] DOB: [birth year] Today's Date: 03/28/2023   History of Present Illness 72 y.o. female presents to [facility] hospital on 03/27/2023 after syncopal episode. Pt found to have UTI and questionable GI bleed secondary to dark tary stools.  Workup pending at this time. PMH includes recurrent syncopal episodes, Parkinson disease, PAF, PE, CAD, HTN, prediabetes.   Clinical Impression   Pt currently at mod to max +2 for toileting tasks in standing and stepping up the EOB with therapist support this session.  Max assist for supine to sit and sit to supine with mod to max for static sitting balance secondary to LOB to the right.  Pt's spouse present and reports that the patient has been needing more assist and he has been transporting her around the house using the rollator and having her sit on it.  Her daughter, therapy, and granddaughter have been assisting with selfcare.  Feel pt will benefit from acute care OT at this time in order to help reduce burden of care and increase basic ADL independence.  Patient will benefit from continued inpatient follow up therapy, <3 hours/day post acute stay.        Recommendations for follow up therapy are one component of a multi-disciplinary discharge planning process, led by the attending physician.  Recommendations may be updated based on patient status, additional functional criteria and insurance authorization.   Assistance Recommended at Discharge Frequent or constant Supervision/Assistance  Patient can return home with the following A lot of help with walking and/or transfers;A lot of help with bathing/dressing/bathroom;Direct supervision/assist for medications management;Assistance with cooking/housework;Direct supervision/assist for financial management;Assist for transportation;Help with stairs or ramp for entrance    Functional Status Assessment  Patient has had a recent decline in  their functional status and demonstrates the ability to make significant improvements in function in a reasonable and predictable amount of time.  Equipment Recommendations  None recommended by OT       Precautions / Restrictions Precautions Precautions: Fall Restrictions Weight Bearing Restrictions: No      Mobility Bed Mobility Overal bed mobility: Needs Assistance Bed Mobility: Supine to Sit, Sit to Supine     Supine to sit: Max assist, HOB elevated Sit to supine: Max assist   General bed mobility comments: Assist needed with all aspects of supine to sit and sit to supine    Transfers Overall transfer level: Needs assistance Equipment used: None Transfers: Sit to/from Stand Sit to Stand: Max assist           General transfer comment: Max assist for standing from elevated EOB.      Balance Overall balance assessment: Needs assistance Sitting-balance support: No upper extremity supported Sitting balance-Leahy Scale: Poor Sitting balance - Comments: Increased LOB and lean to the right.     Standing balance-Leahy Scale: Zero Standing balance comment: Pt needs BUE support for standing                           ADL either performed or assessed with clinical judgement   ADL Overall ADL's : Needs assistance/impaired Eating/Feeding: Set up;Sitting Eating/Feeding Details (indicate cue type and reason): simulated Grooming: Sitting;Wash/dry face;Wash/dry hands;Supervision/safety Grooming Details (indicate cue type and reason): simuulated Upper Body Bathing: Set up;Sitting   Lower Body Bathing: Sit to/from stand;Moderate assistance;+2 for physical assistance       Lower Body Dressing: Moderate assistance;Sit to/from stand;+2 for physical assistance   Toilet  Transfer: Maximal assistance Toilet Transfer Details (indicate cue type and reason): simulated stand step at EOB, pt incontinent of bladder Toileting- Clothing Manipulation and Hygiene: Maximal  assistance;Sit to/from stand;+2 for physical assistance Toileting - Clothing Manipulation Details (indicate cue type and reason): for standing to clean back peri area and pull pants over hips.     Functional mobility during ADLs: Maximal assistance;+2 for physical assistance;+2 for safety/equipment;Cueing for safety;Cueing for sequencing (sit to stand and standing EOB) General ADL Comments: Pt's spouse present for session.  He reports not being able to assist much at home secondary to recent cancer surgery.  She exhibits increased posterior lean in standing and flexed posture with inabilty to achieve full upright trunk extension.  He reports that his daughter and granddaughter were assisting with selfcare tasks and he was using the rollator to have her sit on and push her around their home.  Pt needing mod to max assist for static sitting balance secondary to right lean.     Vision Baseline Vision/History: 0 No visual deficits Ability to See in Adequate Light: 0 Adequate Patient Visual Report: No change from baseline Vision Assessment?: No apparent visual deficits Additional Comments: Vision not formally assessed.  Pt with eyes closed greater than 50% of session.     Perception  Not tested   Praxis  Not tested    Pertinent Vitals/Pain Pain Assessment Pain Assessment: 0-10 Pain Location: left foot Pain Descriptors / Indicators: Discomfort Pain Intervention(s): Limited activity within patient's tolerance, Repositioned     Hand Dominance Right   Extremity/Trunk Assessment Upper Extremity Assessment Upper Extremity Assessment: Overall WFL for tasks assessed   Lower Extremity Assessment Lower Extremity Assessment: Defer to PT evaluation   Cervical / Trunk Assessment Cervical / Trunk Assessment: Kyphotic   Communication Communication Communication: No difficulties   Cognition Arousal/Alertness: Lethargic Behavior During Therapy: Flat affect Overall Cognitive Status:  Impaired/Different from baseline Area of Impairment: Awareness, Safety/judgement                         Safety/Judgement: Decreased awareness of safety     General Comments: Pt with eyes closed frequently during session and delayed initiation to commands.                Home Living Family/patient expects to be discharged to:: Private residence Living Arrangements: Spouse/significant other Available Help at Discharge: Family;Available 24 hours/day (spouse and daughter can assist) Type of Home: House Home Access: Ramped entrance     Home Layout: Two level;Full bath on main level;Able to live on main level with bedroom/bathroom     Bathroom Shower/Tub: Producer, television/film/video: Standard     Home Equipment: Rollator (4 wheels);Rolling Walker (2 wheels);BSC/3in1;Shower seat;Wheelchair - manual          Prior Functioning/Environment Prior Level of Function : Needs assist             Mobility Comments: Pt needed assist for mobility and transfers.  Spouse reports that they would set her on the rollator and push her around recently. ADLs Comments: Daughter assists with bathing        OT Problem List: Decreased activity tolerance;Impaired balance (sitting and/or standing);Decreased safety awareness;Decreased cognition;Decreased strength;Decreased knowledge of use of DME or AE      OT Treatment/Interventions: Self-care/ADL training;Patient/family education;Balance training;Neuromuscular education;Therapeutic activities;DME and/or AE instruction;Therapeutic exercise;Cognitive remediation/compensation    OT Goals(Current goals can be found in the care plan section) Acute Rehab OT Goals Patient  Stated Goal: Pt wants to be "healed". OT Goal Formulation: With patient/family Time For Goal Achievement: 04/11/23 Potential to Achieve Goals: Good  OT Frequency: Min 1X/week       AM-PAC OT "6 Clicks" Daily Activity     Outcome Measure Help from another  person eating meals?: A Little Help from another person taking care of personal grooming?: A Little Help from another person toileting, which includes using toliet, bedpan, or urinal?: A Lot Help from another person bathing (including washing, rinsing, drying)?: A Lot Help from another person to put on and taking off regular upper body clothing?: A Lot Help from another person to put on and taking off regular lower body clothing?: A Lot 6 Click Score: 14   End of Session Nurse Communication: Mobility status  Activity Tolerance: Patient limited by lethargy Patient left: in bed;with call bell/phone within reach;with family/visitor present  OT Visit Diagnosis: Unsteadiness on feet (R26.81);Other abnormalities of gait and mobility (R26.89);Repeated falls (R29.6);Muscle weakness (generalized) (M62.81);Other symptoms and signs involving cognitive function                Time: 1610-9604 OT Time Calculation (min): 35 min Charges:  OT General Charges $OT Visit: 1 Visit OT Evaluation $OT Eval Moderate Complexity: 1 Mod OT Treatments $Self Care/Home Management : 8-22 mins Perrin Maltese, OTR/L Acute Rehabilitation Services  Office [phone] 03/28/2023

## 2023-03-29 DIAGNOSIS — G20B1 Parkinson's disease with dyskinesia, without mention of fluctuations: Secondary | ICD-10-CM | POA: Diagnosis not present

## 2023-03-29 DIAGNOSIS — I10 Essential (primary) hypertension: Secondary | ICD-10-CM | POA: Diagnosis present

## 2023-03-29 DIAGNOSIS — I2510 Atherosclerotic heart disease of native coronary artery without angina pectoris: Secondary | ICD-10-CM | POA: Diagnosis present

## 2023-03-29 DIAGNOSIS — R5381 Other malaise: Secondary | ICD-10-CM | POA: Diagnosis present

## 2023-03-29 DIAGNOSIS — E039 Hypothyroidism, unspecified: Secondary | ICD-10-CM | POA: Diagnosis present

## 2023-03-29 DIAGNOSIS — G20A1 Parkinson's disease without dyskinesia, without mention of fluctuations: Secondary | ICD-10-CM | POA: Diagnosis present

## 2023-03-29 DIAGNOSIS — A499 Bacterial infection, unspecified: Secondary | ICD-10-CM | POA: Diagnosis not present

## 2023-03-29 DIAGNOSIS — E785 Hyperlipidemia, unspecified: Secondary | ICD-10-CM | POA: Diagnosis present

## 2023-03-29 DIAGNOSIS — R55 Syncope and collapse: Secondary | ICD-10-CM | POA: Diagnosis present

## 2023-03-29 DIAGNOSIS — Z1612 Extended spectrum beta lactamase (ESBL) resistance: Secondary | ICD-10-CM | POA: Diagnosis not present

## 2023-03-29 DIAGNOSIS — Z515 Encounter for palliative care: Secondary | ICD-10-CM | POA: Diagnosis not present

## 2023-03-29 DIAGNOSIS — R7303 Prediabetes: Secondary | ICD-10-CM | POA: Diagnosis present

## 2023-03-29 DIAGNOSIS — Z79890 Hormone replacement therapy (postmenopausal): Secondary | ICD-10-CM | POA: Diagnosis not present

## 2023-03-29 DIAGNOSIS — B9689 Other specified bacterial agents as the cause of diseases classified elsewhere: Secondary | ICD-10-CM | POA: Diagnosis present

## 2023-03-29 DIAGNOSIS — Z789 Other specified health status: Secondary | ICD-10-CM

## 2023-03-29 DIAGNOSIS — Z87440 Personal history of urinary (tract) infections: Secondary | ICD-10-CM | POA: Diagnosis not present

## 2023-03-29 DIAGNOSIS — I480 Paroxysmal atrial fibrillation: Secondary | ICD-10-CM | POA: Diagnosis present

## 2023-03-29 DIAGNOSIS — D509 Iron deficiency anemia, unspecified: Secondary | ICD-10-CM | POA: Diagnosis present

## 2023-03-29 DIAGNOSIS — K922 Gastrointestinal hemorrhage, unspecified: Secondary | ICD-10-CM | POA: Diagnosis not present

## 2023-03-29 DIAGNOSIS — R531 Weakness: Secondary | ICD-10-CM

## 2023-03-29 DIAGNOSIS — N390 Urinary tract infection, site not specified: Secondary | ICD-10-CM | POA: Diagnosis present

## 2023-03-29 DIAGNOSIS — R310 Gross hematuria: Secondary | ICD-10-CM | POA: Diagnosis present

## 2023-03-29 DIAGNOSIS — Z86711 Personal history of pulmonary embolism: Secondary | ICD-10-CM | POA: Diagnosis not present

## 2023-03-29 DIAGNOSIS — K921 Melena: Secondary | ICD-10-CM | POA: Diagnosis present

## 2023-03-29 DIAGNOSIS — Z79899 Other long term (current) drug therapy: Secondary | ICD-10-CM | POA: Diagnosis not present

## 2023-03-29 DIAGNOSIS — R1310 Dysphagia, unspecified: Secondary | ICD-10-CM | POA: Diagnosis present

## 2023-03-29 LAB — URINE CULTURE

## 2023-03-29 MED ORDER — METOPROLOL TARTRATE 25 MG PO TABS: 12.5000 mg | ORAL_TABLET | Freq: Two times a day (BID) | ORAL | Status: DC

## 2023-03-29 NOTE — Progress Notes (Signed)
Daily Progress Note Intern Pager: [phone]  Patient name: Anna Bernard Medical record number: [id number] Date of birth: 17-Apr-1951 Age: 72 y.o. Gender: female  Primary Care Provider: Billey Co, MD Consultants: Palliative Code Status: Full Code   Pt Overview and Major Events to Date:  7/8- patient admitted, started abx for UTI  7/9- + FOB, discussion with palliative regarding GOC 7/10- patient and family discussed goals of care and both agree to SNF placement  Assessment and Plan: AWA BACHICHA is a 72 y.o. female presenting with syncopal episode yesterday after having a BM. In the ED she was also found to have UTI with gross hematuria and dark, melanotic stool. Hemoglobin was stable and patient has hx of AVM. Differential for presentation of this includes Chronic GI bleed, Vasovagal, Dehydration, less likely Malignancy. Patient is very motivated to be out of hospital and would not like further work up regarding her presentation.   Hospital Problem List      Hospital     * (Principal) Syncope     Likely multifactorial- general progressive decline, Parkinson's  disease, sedentary with orthostasis  - rediscuss with palliative and family regarding following up outpatient.  - As patient has hx of GI AVMS, will continue protonix  - PT/OT recommended SNF  - Fall and delirium precautions        Parkinson disease     Rigidity on exam with resting tremor. - Continue carbodopa-levodopa. Per neurology, trialed off of medication  and patient felt significantly worse        Paroxysmal atrial fibrillation (HCC)     NSR on exam. - Cardiac monitoring - continue patient on metoprolol 12.5 mg BID        Pulmonary embolus (HCC)     Hx PE. Not on anticoagulation given bleeding risk.  - SCDs        ESBL (extended spectrum beta-lactamase) producing bacteria infection     UA concerning for UTI (leuks, many bacteria). Question underlying  colonization, but given presence of  dysuria/infectious symptoms, will  treat. Patient has hx of UTI. S/p 1 dose CTX in ED. Treated w/ PO  Fosfomycin 3 mg x1 given ESBL producing organisms on urine cultures for  the past 4 years (11/17/22, 05/19/22, 01/16/22, 09/02/19) - will continue to monitor sxs         Goals of care, counseling/discussion     Patient and husband amenable to GOC discussion with palliative.  Broached the concept of symptomatic treatment and minmizing aggressive  interventions/frequent hospitalizations. Palliative on board, see note.  - Palliative discussed with patient and family and they are agreeable to  SNF, see palliative note for details         Generalized weakness     Patient has generalized weakness, likely multifactorial with old age,  parkinsons, and decline in cognition - PT/OT following IP, recommended patient be placed at SNF for rehab       FEN/GI: Full Diet  PPx: SCDs  Dispo:SNF pending clinical improvement . Barriers include placement.   Subjective:  Patient very sleep at bedside. Patient speaking very slowly and quietly so history was difficult to obtain.   Objective: Temp:  [97.4 F (36.3 C)-99 F (37.2 C)] 97.4 F (36.3 C) (07/10 1419) Pulse Rate:  [72-82] 79 (07/10 1419) Resp:  [9-16] 16 (07/10 1323) BP: (93-160)/(46-67) 121/63 (07/10 1419) SpO2:  [98 %-100 %] 100 % (07/10 1419) Physical Exam: General: frail, laying in bed  Cardiovascular: RRR, no  M/R/G Respiratory: normal resp effort, normal breath sounds Abdomen: bowel sounds are normal Extremities: patient is weak and having difficulty ambulating   Laboratory: Most recent CBC Lab Results  Component Value Date   WBC 3.6 (L) 03/28/2023   HGB 11.1 (L) 03/28/2023   HCT 33.0 (L) 03/28/2023   MCV 74.3 (L) 03/28/2023   PLT 235 03/28/2023   Most recent BMP    Latest Ref Rng & Units 03/27/2023    2:30 PM  BMP  Glucose 70 - 99 mg/dL 161   BUN 8 - 23 mg/dL 13   Creatinine 0.96 - 1.00 mg/dL 0.45   Sodium 409 -  811 mmol/L 139   Potassium 3.5 - 5.1 mmol/L 3.9   Chloride 98 - 111 mmol/L 105   CO2 22 - 32 mmol/L 20   Calcium 8.9 - 10.3 mg/dL 8.1     Other pertinent labs   Imaging/Diagnostic Tests: No new imaging Cyd Silence, MD 03/29/2023, 4:37 PM  PGY-1, [facility] FPTS Intern pager: [phone], text pages welcome Secure chat group [facility] Teaching Service

## 2023-03-29 NOTE — Assessment & Plan Note (Signed)
Patient has generalized weakness, likely multifactorial with old age, parkinsons, and decline in cognition - PT/OT following IP, recommended patient be placed at [facility] for rehab

## 2023-03-29 NOTE — Progress Notes (Signed)
Brief Palliative Medicine Progress Note:  Continued GOC discussion was had with patient, her husband, and daughter today. Full note to follow:  Recommendations/Plan: Continue to treat the treatable; however, patient and family defer work up for melena at this time. They want to continue follow up with outpatient GI and continue GOC pending patient's clinical course Continue full code at this time. When patient can no longer ambulate, she would want code status change to DNR/DNI and family are supportive Goal is for discharge to rehab with outpatient Palliative Care to follow [facility] consulted for: outpatient Palliative Care referral Introduced and reviewed MOST form - family wish to take time to consider information prior to finalizing. They will call PMT if they would like to finalize prior to discharge. If not, form can be completed by outpatient provider PMT will continue to follow peripherally. If there are any imminent needs please call the service directly   Phelix Fudala M. Katrinka Blazing [facility] Palliative Medicine Team Team Phone: [phone] NO CHARGE

## 2023-03-29 NOTE — Progress Notes (Signed)
   03/29/23 1600  Spiritual Encounters  Type of Visit Initial  Care provided to: Patient  Referral source Patient request  Reason for visit Routine spiritual support  OnCall Visit No   Pt requested prayer while rounding on the floor. Pt said she keeps getting angrier. When Ch probe about it she said she did not want to talk about it but want Ch to pray for healing. Ch prayed a for healing. Pt expressed appreciation. No follow-up needed at this time.

## 2023-03-29 NOTE — Progress Notes (Signed)
Daily Progress Note   Patient Name: Anna Bernard       Date: 03/29/2023 DOB: 1951/04/16  Age: 72 y.o. MRN#: [id number] Attending Physician: Carney Living, MD Primary Care Physician: Billey Co, MD Admit Date: 03/27/2023  Reason for Consultation/Follow-up: Establishing goals of care  Subjective: I have reviewed medical records including EPIC notes, MAR, and labs. Discussed case in detail with attending team. Received report from primary RN - no acute concerns. RN reports patient ate about 25% of breakfast.   Micah Flesher to visit patient at bedside - husband present; daughter/Karetha joined meeting via speakerphone. Patient was lying in bed awake, alert, oriented to self and place only, and is able to participate in conversation and make her goals/wishes known. I am uncertain if she is able to make complex medical decisions; however family are involved in her care and support her expressed goals/wishes at this time.  Emotional support provided to patient and family. Therapeutic listening provided as family reflect on information they have been given as well as discussions with my colleague Josseline, PA.   Discussion on the difference between Palliative and Hospice care. Palliative care and hospice have similar goals of managing symptoms, promoting comfort, improving quality of life, and maintaining a person's dignity. However, palliative care may be offered during any phase of a serious illness, while hospice care is usually offered when a person is expected to live for 6 months or less.  Reviewed goals for discharge and disposition options. After discussion, and though patient does desire to return home, family and patient's agree goal is for discharge to short term rehab. Patient had a  previously positive experience at [facility] and they are hopeful she can go there again - will notify TOC.  We discussed patient's melena/chronic GIB in detail. Reviewed labs. Discussed recommendations previously given to patient/family by outpatient GI provider. Patient/family do not desire work up for melena at this time. They would like to continue to follow up with outpatient GI. Patient/family will continue GOC with GI and outpatient Palliative Care pending patient's clinical course regarding decisions on next steps, if any.  Encouraged patient/family to consider DNR/DNI status understanding evidenced based poor outcomes in similar hospitalized patient, as the cause of arrest is likely associated with advanced chronic/terminal illness rather than an easily reversible acute cardio-pulmonary event.  I  shared that even if we pursued resuscitation we would not able to resolve the underlying factors. I explained that DNR/DNI does not change the medical plan and it only comes into effect after a person has arrested (died).  It is a protective measure to keep [place] from harming the patient in their last moments of life. Tells me in regards to resuscitation, "if I can walk, yes I do (want attempts)." She is clear that when she is unable to walk, she would not find this an acceptable quality of life and would want DNR/DNI at that time. Family express understanding and are supportive of her wishes. Patient/Family were not agreeable to DNR/DNI with understanding that she would receive CPR, defibrillation, ACLS medications, and/or intubation.   Introduced and reviewed MOST form in detail. Family wish to consider information and discuss with patient further prior to finalizing. Blank MOST form left at bedside. Family will call PMT if they desire to complete form prior to her discharge. Education provided that form could also be completed with any outpatient provider, including outpatient Palliative Care.  Family and  patient are very interested in having outpatient Palliative Care follow patient at discharge. They are appreciative of the support.   Another Hard Choices book provided for daughter.   All questions and concerns addressed. Encouraged to call with questions and/or concerns. PMT card provided.  Length of Stay: 0  Current Medications: Scheduled Meds:   Carbidopa-Levodopa ER  1 tablet Oral Q24H   Carbidopa-Levodopa ER  2 tablet Oral TID   levothyroxine  100 mcg Oral QAC breakfast   metoprolol tartrate  12.5 mg Oral BID   pantoprazole (PROTONIX) IV  40 mg Intravenous Q12H   rosuvastatin  10 mg Oral QPM    Continuous Infusions:  sodium chloride 100 mL/hr at 03/29/23 0608    PRN Meds: acetaminophen  Physical Exam Vitals and nursing note reviewed.  Constitutional:      General: She is not in acute distress. Pulmonary:     Effort: No respiratory distress.  Skin:    General: Skin is warm and dry.  Neurological:     Mental Status: She is alert. She is disoriented.     Motor: Weakness present.  Psychiatric:        Attention and Perception: Attention normal.        Behavior: Behavior is cooperative.        Cognition and Memory: Cognition and memory normal.             Vital Signs: BP (!) 135/58 (BP Location: Left Arm)   Pulse 82   Temp 98.9 F (37.2 C) (Oral)   Resp 15   Ht 5\' 1"  (1.549 m)   Wt 72 kg   SpO2 98%   BMI 29.99 kg/m  SpO2: SpO2: 98 % O2 Device: O2 Device: Room Air O2 Flow Rate:    Intake/output summary:  Intake/Output Summary (Last 24 hours) at 03/29/2023 1023 Last data filed at 03/29/2023 0849 Gross per 24 hour  Intake --  Output 1500 ml  Net -1500 ml   LBM: Last BM Date : 03/27/23 Baseline Weight: Weight: 72 kg Most recent weight: Weight: 72 kg       Palliative Assessment/Data: PPS 40-50%      Patient Active Problem List   Diagnosis Date Noted   Urinary tract infection without hematuria 03/28/2023   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 coli infection 04/29/2022   Need for home health care 04/29/2022   Hypothyroidism 03/03/2022   Pulmonary embolus (HCC)    Non-ST elevation (NSTEMI) myocardial infarction [facility])    Paroxysmal atrial fibrillation (HCC)    Chronic bilateral low back pain without sciatica 01/14/2022   Seborrheic keratoses 07/09/2021   Parkinson disease 04/23/2020   Chronic venous insufficiency 11/26/2019   Vitamin B12 deficiency 02/06/2018   Iron deficiency anemia due to chronic blood loss 01/29/2018   Pure hypercholesterolemia 12/24/2014   Diabetes mellitus type 2, controlled, without complications (HCC) 12/24/2014   Bruit of right carotid artery 12/24/2014   Thyroid disease 07/06/2014   Severe obesity (BMI >= 40) (HCC) 07/06/2014   Essential hypertension 07/06/2014    Palliative Care Assessment & Plan   Patient Profile: 72 y.o. female  with past medical history of Parkinson's disease, A-fib, AVMs, PE admitted on 03/27/2023 with syncopal episode.    Patient's syncopal event is recurring and most likely related to balance issues and rigidity from Parkinson's disease.  She has been interested in discussing her care further with her family, including CODE STATUS.  Assessment: Principal Problem:   Syncope Active Problems:   Parkinson disease   Paroxysmal atrial fibrillation (HCC)   Pulmonary embolus (HCC)   ESBL (extended spectrum beta-lactamase) producing bacteria infection   Goals of care,  counseling/discussion   Recommendations/Plan: Continue to treat the treatable; however, patient and family defer work up for melena at this time. They want to continue follow up with outpatient GI and continue GOC pending patient's clinical course Continue full code at this time. When patient can no longer ambulate, she would want code status change to DNR/DNI and family are supportive Goal is for discharge to rehab with outpatient Palliative Care to follow [facility] consulted for: outpatient Palliative Care referral Introduced and reviewed MOST form - family wish to take time to consider information prior to finalizing. They will call PMT if they would like to finalize prior to discharge. If not, form can be completed by outpatient provider PMT will continue to follow peripherally. If there are any imminent needs please call the service directly  Goals of Care and Additional Recommendations: Limitations on Scope of Treatment: No Tracheostomy  Code Status:    Code Status Orders  (From admission, onward)           Start     Ordered   03/27/23 2336  Full code  Continuous       Question:  By:  Answer:  Consent: discussion documented in EHR   03/27/23 2341           Code Status History     Date Active Date Inactive Code Status Order ID Comments User Context   01/12/2023 2126 01/17/2023 1935 Full Code [id number]  Hillary Bow, DO ED   11/17/2022 1701 11/20/2022 1950 Full Code [id number]  Lurline Del, MD ED   05/24/2022 1738 05/27/2022 2231 Full Code [id number]  Evelena Leyden, DO Inpatient   05/24/2022 1728 05/24/2022 1738 Full Code [id number]  Evelena Leyden, DO Inpatient   01/16/2022 0841 01/20/2022 0144 Full Code  [id number]  Alfredo Martinez, MD ED   01/29/2018 0527 02/01/2018 1317 Full Code [id number]  Briscoe Deutscher, MD ED       Prognosis:  Unable to determine  Discharge Planning: Skilled Nursing Facility for rehab with Palliative care service follow-up  Care plan was discussed with  primary RN, patient, patient's family, TOC, attending   Thank you for allowing the Palliative Medicine Team to assist in the care of this patient.   Total Time 70 minutes Prolonged Time Billed  yes       Greater than 50%  of this time was spent counseling and coordinating care related to the above assessment and plan.  Haskel Khan, NP  Please contact Palliative Medicine Team phone at [phone] for questions and concerns.   *Portions of this note are a verbal dictation therefore any spelling and/or grammatical errors are due to the "[employer]agon [employer] One" system interpretation.

## 2023-03-29 NOTE — Assessment & Plan Note (Addendum)
NSR on exam. - Cardiac monitoring - continue patient on metoprolol 12.5 mg BID 

## 2023-03-29 NOTE — Assessment & Plan Note (Signed)
Likely multifactorial- general progressive decline, Parkinson's disease, sedentary with orthostasis  - rediscuss with palliative and family regarding following up outpatient.  - As patient has hx of GI AVMS, will continue protonix  - PT/OT recommended SNF  - Fall and delirium precautions

## 2023-03-29 NOTE — Assessment & Plan Note (Signed)
Rigidity on exam with resting tremor. - Continue carbodopa-levodopa. Per neurology, trialed off of medication and patient felt significantly worse 

## 2023-03-29 NOTE — Assessment & Plan Note (Signed)
Patient and husband amenable to GOC discussion with palliative. Broached the concept of symptomatic treatment and minmizing aggressive interventions/frequent hospitalizations. Palliative on board, see note.  - Palliative discussed with patient and family and they are agreeable to SNF, see palliative note for details

## 2023-03-29 NOTE — Assessment & Plan Note (Signed)
UA concerning for UTI (leuks, many bacteria). Question underlying colonization, but given presence of dysuria/infectious symptoms, will treat. Patient has hx of UTI. S/p 1 dose CTX in ED. Treated w/ PO Fosfomycin 3 mg x1 given ESBL producing organisms on urine cultures for the past 4 years (11/17/22, 05/19/22, 01/16/22, 09/02/19) - will continue to monitor sxs

## 2023-03-29 NOTE — Assessment & Plan Note (Signed)
Hx PE. Not on anticoagulation given bleeding risk.  - SCDs

## 2023-03-29 NOTE — Discharge Summary (Addendum)
Family Medicine Teaching [facility] Discharge Summary  Patient name: Anna Bernard Medical record number: [id number] Date of birth: [birth year] Age: 72 y.o. Gender: female Date of Admission: 03/27/2023  Date of Discharge: 04/07/23  Admitting Physician: Penne Lash, MD  Primary Care Provider: Billey Co, MD Consultants: Palliative  Indication for Hospitalization: Syncope   Brief Hospital Course:  KEYSI SOLIDAY is a 72 y.o.female with a history of CAD, HTN, Hyperlipidemia, Anemia, Paroxysmal Afib, PE, and Prediabetes who was admitted to the [facility] Medicine Teaching Service at [facility] for Syncope. Her hospital course is detailed below:  UTI Patient endorses having UTI symptoms of dysuria for 1 month. Urine was cloudy and malodorous. Given 1 time dose Rocephin in the ED. Past urine cultures showed probable ESBL so patient was given Fosfomycin x1 in the ED.  Patient not complaining of any UTI sx on discharge.   Syncope Patient presented to ED after syncopal episode following BM. As patient has hx of small bowel AVMs, did CBC for acute blood loss. Hgb was 11.2, which actually appears to be better than patient's baseline (9). Troponins were WNL and EKG showed NSR. Head CT showed no acute intracranial process with some stable mild atrophy and chronic small vessel ischemic changes. This is likely multifactorial and most likely related to balance issues and rigidity from Parkinson's disease. Patient was treated with IVF on admission and blood pressures improved. Patient saw PT/OT who recommended short term SNF placement.   Melanic Stool in ED Patient was FOB+ with one melanic stool per ED, though Hg was stable. Patient has history of AVMs seen on endoscopy but expressed not wanting further work up at this time.  Parkinson's Disease Patient has rigidity on exam with a resting tremor which is her baseline. Patient continued on home medications.    Other chronic conditions were medically  managed with home medications and formulary alternatives as necessary ()  PCP Follow-up Recommendations: Patient has history of ESBL UTI x2, please ask about dysuria, frequency, etc Patient BP normotensive to slightly hypertensive during admission, consider readjusting BP medications Patient has history of small bowel AVMs and was prescribed protonix. Patient ran out of these and was restarted during admission. Follow up regarding adherence.  Patient has history of hammertoe. Please follow up regarding pain management.    Discharge Diagnoses/Problem List:  Principal Problem for Admission: Syncope Principal Problem:   ESBL (extended spectrum beta-lactamase) producing bacteria infection Active Problems:   Syncope   Paroxysmal atrial fibrillation (HCC)   Parkinson disease   Essential hypertension   Debility   Hx of pulmonary embolus   Iron deficiency anemia    Disposition: [facility]   Discharge Condition: Stable   Discharge Exam:  Vitals:   04/07/23 0534 04/07/23 0909  BP: 139/74 (!) 118/57  Pulse: 74 79  Resp: 18 17  Temp: 98.5 F (36.9 C) (!) 97.2 F (36.2 C)  SpO2: 97% 98%   General: frail, alert, NAD,  CV: RRR, no murmurs auscultated Pulm: CTAB, normal WOB Abdomen: soft, nondistended, nontender, normoactive BS MSK: increased rigidity with resting tremor  Neurological: oriented to self but not to location and time  Significant Procedures: None   Significant Labs and Imaging:  None    Pertinent Imaging   CT head: - No acute intracranial process. - Stable mild atrophy and chronic small vessel ischemic changes.   Results/Tests Pending at Time of Discharge:   Discharge Medications:  Allergies as of 04/07/2023   No Known Allergies  Medication List     STOP taking these medications    oxyCODONE 5 MG immediate release tablet Commonly known as: Oxy IR/ROXICODONE       TAKE these medications    Carbidopa-Levodopa ER 25-100 MG tablet controlled  release Commonly known as: SINEMET CR 2 at 9-10am, 2 at 1pm, 2 at 5pm, 1 at 7-8pm What changed:  how much to take how to take this when to take this additional instructions   levothyroxine 100 MCG tablet Commonly known as: SYNTHROID NEW PRESCRIPTION REQUEST: TAKE ONE TABLET BY MOUTH DAILY What changed: See the new instructions.   melatonin 3 MG Tabs tablet Take 3 mg by mouth at bedtime as needed (For sleep).   metoprolol tartrate 25 MG tablet Commonly known as: LOPRESSOR Take 0.5 tablets (12.5 mg total) by mouth 2 (two) times daily. What changed: See the new instructions.   multivitamin with minerals Tabs tablet Take 1 tablet by mouth daily with breakfast.   pantoprazole 40 MG tablet Commonly known as: PROTONIX NEW PRESCRIPTION REQUEST: TAKE ONE TABLET BY MOUTH TWICE DAILY What changed: See the new instructions.   rosuvastatin 10 MG tablet Commonly known as: CRESTOR Take 1 tablet (10 mg total) by mouth every evening.   senna-docusate 8.6-50 MG tablet Commonly known as: Senokot-S Take 1 tablet by mouth at bedtime. Hold if diarrhea   Tylenol 8 Hour Arthritis Pain 650 MG CR tablet Generic drug: acetaminophen Take 650-1,300 mg by mouth every 8 (eight) hours as needed for pain.        Discharge Instructions: Please refer to Patient Instructions section of EMR for full details.  Patient was counseled important signs and symptoms that should prompt return to medical care, changes in medications, dietary instructions, activity restrictions, and follow up appointments.   Follow-Up Appointments:  Contact information for follow-up providers     Pray, Milus Mallick, MD.   Specialty: [facility] Medicine Contact information: [address] [place] [address] [phone]              Contact information for after-discharge care     Destination     [facility] [place] Preferred SNF .   Service: Skilled Nursing Contact information: [address] [place] [address] [phone]                     Penne Lash, MD 04/07/2023, 11:58 AM PGY-1, [facility] Family Medicine

## 2023-03-29 NOTE — Care Management Obs Status (Signed)
MEDICARE OBSERVATION STATUS NOTIFICATION   Patient Details  Name: Anna Bernard MRN: [id number] Date of Birth: [birth year]   Medicare Observation Status Notification Given:  Yes    Gala Lewandowsky, RN 03/29/2023, 11:30 AM

## 2023-03-29 NOTE — Progress Notes (Signed)
[facility] Hospital Liaison Note  Notified by TOC manager of patient/family request for [facility] palliative services at home after discharge.   [facility] hospital liaison will follow patient for discharge disposition.   Please call with any hospice or outpatient palliative care related questions.   Thank you for the opportunity to participate in this patient's care.   Shanita Wicker, LCSW [facility] Hospital Liaison [phone]  

## 2023-03-29 NOTE — TOC Initial Note (Addendum)
Transition of Care [facility]) - Initial/Assessment Note    Patient Details  Name: Anna Bernard MRN: [id number] Date of Birth: 12/06/1950  Transition of Care [facility]) CM/SW Contact:    Delilah Shan, LCSWA Phone Number: 03/29/2023, 1:42 PM  Clinical Narrative:                  CSW received consult for possible SNF placement at time of discharge. Patient gave permission to speak with her daughter Anna Bernard at bedside regarding PT recommendation of SNF placement at time of discharge. Anna Bernard reports PTA patient comes from home with spouse. Patients daughter expressed understanding of PT recommendation and is agreeable to SNF placement for patient at time of discharge. Anna Bernard reports number one choice is [facility]. Anna Bernard gave CSW permission to fax out for SNF placement for patient.CSW discussed insurance authorization process and will provide Medicare SNF ratings list with accepted SNF bed offers when available.CSW received consult for referral for palliative services to follow patient at SNF. CSW spoke with [place] who gave CSW permission to make referral to [facility] for palliative services to follow patient at SNF. CSW made referral to [facility] with [facility] for palliative services to follow patient at SNF. No further questions reported at this time. CSW to continue to follow and assist with discharge planning needs.   Expected Discharge Plan: Skilled Nursing Facility Barriers to Discharge: Continued Medical Work up   Patient Goals and CMS Choice Patient states their goals for this hospitalization and ongoing recovery are:: SNF CMS Medicare.gov Compare Post Acute Care list provided to:: Patient Choice offered to / list presented to : Patient, Adult Children      Expected Discharge Plan and Services In-house Referral: Clinical Social Work     Living arrangements for the past 2 months: Single Family Home                                      Prior Living  Arrangements/Services Living arrangements for the past 2 months: Single Family Home Lives with:: Spouse Patient language and need for interpreter reviewed:: Yes Do you feel safe going back to the place where you live?: No   SNF  Need for Family Participation in Patient Care: Yes (Comment) Care giver support system in place?: Yes (comment)   Criminal Activity/Legal Involvement Pertinent to Current Situation/Hospitalization: No - Comment as needed  Activities of Daily Living Home Assistive Devices/Equipment: Wheelchair ADL Screening (condition at time of admission) Patient's cognitive ability adequate to safely complete daily activities?: Yes Is the patient deaf or have difficulty hearing?: No Does the patient have difficulty seeing, even when wearing glasses/contacts?: No Does the patient have difficulty concentrating, remembering, or making decisions?: No Patient able to express need for assistance with ADLs?: Yes Does the patient have difficulty dressing or bathing?: Yes Independently performs ADLs?: Yes (appropriate for developmental age) Does the patient have difficulty walking or climbing stairs?: Yes Weakness of Legs: Both Weakness of Arms/Hands: Both  Permission Sought/Granted Permission sought to share information with : Case Manager, Magazine features editor, Family Supports Permission granted to share information with : Yes, Verbal Permission Granted  Share Information with NAME: Anna Bernard and Investment banker, operational  Permission granted to share info w AGENCY: SNF  Permission granted to share info w Relationship: Spouse and Daughter  Permission granted to share info w Contact Information: Anna Bernard [phone] Anna Bernard [phone]  Emotional Assessment Appearance:: Appears stated age Attitude/Demeanor/Rapport: Gracious Affect (typically  observed): Calm Orientation: : Oriented to Self, Oriented to Place, Oriented to Situation Alcohol / Substance Use: Not Applicable Psych  Involvement: No (comment)  Admission diagnosis:  Syncope [R55] Urinary tract infection without hematuria, site unspecified [N39.0] Gastrointestinal hemorrhage, unspecified gastrointestinal hemorrhage type [K92.2] Syncope, unspecified syncope type [R55] Generalized weakness [R53.1] Patient Active Problem List   Diagnosis Date Noted   Generalized weakness 03/29/2023   Urinary tract infection without hematuria 03/28/2023   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 coli infection 04/29/2022   Need for home health care 04/29/2022   Hypothyroidism 03/03/2022   Pulmonary embolus (HCC)    Non-ST elevation (NSTEMI) myocardial infarction (HCC)    Paroxysmal atrial fibrillation (HCC)    Chronic bilateral low back pain without sciatica 01/14/2022   Seborrheic keratoses 07/09/2021   Parkinson disease 04/23/2020   Chronic venous insufficiency 11/26/2019   Vitamin B12 deficiency 02/06/2018   Iron deficiency anemia due to chronic blood loss 01/29/2018   Pure hypercholesterolemia 12/24/2014   Diabetes mellitus type 2, controlled, without complications (HCC) 12/24/2014   Bruit of right carotid artery 12/24/2014   Thyroid disease 07/06/2014   Severe obesity (BMI >= 40) (HCC) 07/06/2014   Essential hypertension 07/06/2014   PCP:  Billey Co, MD Pharmacy:   [facility] [phone] Ginette Otto, [place] - [address] [place] [address] Phone: [phone] Fax:  [phone]     Social Determinants of Health (SDOH) Social History: SDOH Screenings   Food Insecurity: No Food Insecurity (03/28/2023)  Housing: Patient Declined (03/28/2023)  Transportation Needs: No Transportation Needs (03/28/2023)  Utilities: At Risk (03/28/2023)  Alcohol Screen: Low Risk  (01/05/2023)  Depression (PHQ2-9): Low Risk  (01/05/2023)  Financial Resource Strain: Low Risk  (01/05/2023)  Physical Activity: Inactive (01/05/2023)  Social Connections: Moderately Isolated (01/05/2023)  Stress: No Stress Concern Present (01/05/2023)  Tobacco Use: Medium Risk (03/28/2023)   SDOH Interventions:     Readmission Risk Interventions    01/17/2023    1:37 PM  Readmission Risk Prevention Plan  Transportation Screening Complete  PCP or Specialist Appt within 5-7 Days Complete  Home Care Screening Complete  Medication Review (RN CM) Referral to Pharmacy

## 2023-03-30 ENCOUNTER — Inpatient Hospital Stay (HOSPITAL_COMMUNITY): Payer: Medicare Other

## 2023-03-30 DIAGNOSIS — G20B1 Parkinson's disease with dyskinesia, without mention of fluctuations: Secondary | ICD-10-CM

## 2023-03-30 DIAGNOSIS — A499 Bacterial infection, unspecified: Secondary | ICD-10-CM | POA: Diagnosis not present

## 2023-03-30 DIAGNOSIS — Z1612 Extended spectrum beta lactamase (ESBL) resistance: Secondary | ICD-10-CM | POA: Diagnosis not present

## 2023-03-30 DIAGNOSIS — R5381 Other malaise: Secondary | ICD-10-CM

## 2023-03-30 LAB — URINE CULTURE

## 2023-03-30 MED ORDER — PANTOPRAZOLE SODIUM 40 MG PO TBEC
40.0000 mg | DELAYED_RELEASE_TABLET | Freq: Every day | ORAL | Status: DC
  Administered 2023-03-30 – 2023-04-07 (×9): 40 mg via ORAL
  Filled 2023-03-30 (×9): qty 1

## 2023-03-30 NOTE — Progress Notes (Signed)
Modified Barium Swallow Study  Patient Details  Name: Anna Bernard MRN: [id number] Date of Birth: [birth year]  Today's Date: 03/30/2023  Modified Barium Swallow completed.  Full report located under Chart Review in the Imaging Section.  History of Present Illness Anna Bernard is a 72 y.o. female presenting with syncopal episode yesterday after having a BM. In the ED she was also found to have UTI with gross hematuria and dark, melanotic stool. Hemoglobin was stable and patient has hx of AVM. Differential for presentation of this includes Chronic GI bleed, Vasovagal, Dehydration, less likely Malignancy. Patient is very motivated to be out of hospital and would not like further work up regarding her presentation. Pt with hx Parkinson's Disease, CAD, HTN, Hyperlipidemia, Hypothyroidism, Paroxysmal Atrial Fibrillation, PE, Prediabetes, Anemia.   Clinical Impression Pt exhibited mild oropharyngeal dysphagia marked by decreased oral cohesion with premature spill to pyriform sinuses with thin. Lingual residue that pt senses and reduces to ocassional trace. She had a significant delay in propelling puree bolus. Pt's laryngeal elevation, anterior hyoid excursion and laryngeal closure all adequate to prevent aspiration with cup and straw sips thin. Trace penetration with one instance of thin using straw out of multiple trials. She was unable to perform sequential swallows during this MBS. Vallecular and pyriform sinus residue consistent throughout that did not increase with increased viscosity. Pt sensed and performed subsequent swallows to clear majority. Esophageal scan did not reveal abnormalities although MBS does not diagnose below level of the PES. Recommend pt continue with Dys 3 texture, upgrade to thin liquids, straws allowed with small sips. Factors that may increase risk of adverse event in presence of aspiration Rubye Oaks & Clearance Coots 2021):  (Parkinsons disease)  Swallow Evaluation  Recommendations Recommendations: PO diet PO Diet Recommendation: Dysphagia 3 (Mechanical soft);Thin liquids (Level 0) Liquid Administration via: Cup;Straw Medication Administration: Whole meds with puree Supervision: Staff to assist with self-feeding Swallowing strategies  : Slow rate;Small bites/sips;Multiple dry swallows after each bite/sip Postural changes: Position pt fully upright for meals Oral care recommendations: Oral care BID (2x/day)      Royce Macadamia 03/30/2023,2:00 PM

## 2023-03-30 NOTE — TOC Progression Note (Signed)
Transition of Care [facility]) - Progression Note    Patient Details  Name: Anna Bernard MRN: [id number] Date of Birth: 1951/06/04  Transition of Care [facility] CM/SW Contact  Jerame Hedding A [place], [place] Phone Number: 03/30/2023, 3:08 PM  Clinical Narrative:     CSW met with pt and pt's husband at bedside and provided bed offers. Pt's husband said he would look over the list and talk with their daughter regarding bed choice. CSW was informed that Anna Bernard was the family's preference and CSW will reach out to liaison regarding potential bed offer. CSW will follow up with pt's bed choice and continue with disposition plan.   TOC will continue to follow.   Expected Discharge Plan: Skilled Nursing Facility Barriers to Discharge: Continued Medical Work up  Expected Discharge Plan and Services In-house Referral: Clinical Social Work     Living arrangements for the past 2 months: Single Family Home                                       Social Determinants of Health (SDOH) Interventions SDOH Screenings   Food Insecurity: No Food Insecurity (03/28/2023)  Housing: Patient Declined (03/28/2023)  Transportation Needs: No Transportation Needs (03/28/2023)  Utilities: At Risk (03/28/2023)  Alcohol Screen: Low Risk  (01/05/2023)  Depression (PHQ2-9): Low Risk  (01/05/2023)  Financial Resource Strain: Low Risk  (01/05/2023)  Physical Activity: Inactive (01/05/2023)  Social Connections: Moderately Isolated (01/05/2023)  Stress: No Stress Concern Present (01/05/2023)  Tobacco Use: Medium Risk (03/28/2023)    Readmission Risk Interventions    01/17/2023    1:37 PM  Readmission Risk Prevention Plan  Transportation Screening Complete  PCP or Specialist Appt within 5-7 Days Complete  Home Care Screening Complete  Medication Review (RN CM) Referral to Pharmacy

## 2023-03-30 NOTE — Assessment & Plan Note (Signed)
UA concerning for UTI (leuks, many bacteria). Question underlying colonization, but given presence of dysuria/infectious symptoms, will treat. Patient has hx of UTI. S/p 1 dose CTX in ED. Treated w/ PO Fosfomycin 3 mg x1 given ESBL producing organisms on urine cultures for the past 4 years (11/17/22, 05/19/22, 01/16/22, 09/02/19) - will continue to monitor sxs  

## 2023-03-30 NOTE — Evaluation (Signed)
Clinical/Bedside Swallow Evaluation Patient Details  Name: Anna Bernard MRN: [id number] Date of Birth: August 11, 1951  Today's Date: 03/30/2023 Time: SLP Start Time (ACUTE ONLY): 0949 SLP Stop Time (ACUTE ONLY): 1002 SLP Time Calculation (min) (ACUTE ONLY): 13 min  Past Medical History:  Past Medical History:  Diagnosis Date   Anemia, unspecified    Carotid artery disease (HCC)    Hyperlipidemia, unspecified    Hypertension    Hypothyroidism, unspecified    Mild CAD    PAF (paroxysmal atrial fibrillation) (HCC)    Pre-diabetes    Pulmonary emboli (HCC)    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 by duplex 2023   Vitamin B12 deficiency    Past Surgical History:  Past Surgical History:  Procedure Laterality Date   ABDOMINAL HYSTERECTOMY     BIOPSY  01/13/2023   Procedure: BIOPSY;  Surgeon: Jeani Hawking, MD;  Location: [facility] ENDOSCOPY;  Service: Gastroenterology;;   COLONOSCOPY WITH PROPOFOL N/A 01/15/2023   Procedure: COLONOSCOPY WITH PROPOFOL;  Surgeon: Tressia Danas, MD;  Location: [facility] ENDOSCOPY;  Service: Gastroenterology;  Laterality: N/A;   ENTEROSCOPY N/A 11/18/2022   Procedure: ENTEROSCOPY;  Surgeon: Jeani Hawking, MD;  Location: [facility] ENDOSCOPY;  Service: Gastroenterology;  Laterality: N/A;   ENTEROSCOPY N/A 01/13/2023   Procedure: ENTEROSCOPY;  Surgeon: Jeani Hawking, MD;  Location: [facility] ENDOSCOPY;  Service: Gastroenterology;  Laterality: N/A;   GIVENS CAPSULE STUDY N/A 04/16/2018   Procedure: GIVENS CAPSULE STUDY;  Surgeon: Jeani Hawking, MD;  Location: [facility] ENDOSCOPY;  Service: Endoscopy;  Laterality: N/A;   GIVENS CAPSULE STUDY  01/15/2023   Procedure: GIVENS CAPSULE STUDY;  Surgeon: Tressia Danas, MD;  Location: [facility] ENDOSCOPY;  Service: Gastroenterology;;   HOT HEMOSTASIS N/A 11/18/2022   Procedure: HOT HEMOSTASIS (ARGON PLASMA COAGULATION/BICAP);  Surgeon: Jeani Hawking, MD;  Location: [facility] ENDOSCOPY;  Service: Gastroenterology;  Laterality: N/A;   PARS PLANA  VITRECTOMY Right 08/16/2020   Procedure: 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;  Surgeon: Carmela Rima, MD;  Location: [facility] OR;  Service: Ophthalmology;  Laterality: Right;   PARS PLANA VITRECTOMY Right 09/29/2020   Procedure: PARS PLANA VITRECTOMY WITH 25 GAUGE - ENDOCAUTRY;  Surgeon: Carmela Rima, MD;  Location: [facility] OR;  Service: Ophthalmology;  Laterality: Right;   PHOTOCOAGULATION WITH LASER Right 08/16/2020   Procedure: PHOTOCOAGULATION WITH LASER;  Surgeon: Carmela Rima, MD;  Location: [facility] OR;  Service: Ophthalmology;  Laterality: Right;   PHOTOCOAGULATION WITH LASER Right 09/29/2020   Procedure: PHOTOCOAGULATION WITH LASER;  Surgeon: Carmela Rima, MD;  Location: [facility] OR;  Service: Ophthalmology;  Laterality: Right;   POLYPECTOMY  01/15/2023   Procedure: POLYPECTOMY;  Surgeon: Tressia Danas, MD;  Location: [facility] ENDOSCOPY;  Service: Gastroenterology;;   SHOULDER SURGERY     TONSILLECTOMY     TUBAL LIGATION     HPI:  Anna Bernard is a 72 y.o. female presenting with syncopal episode yesterday after having a BM. In the ED she was also found to have UTI with gross hematuria and dark, melanotic stool. Hemoglobin was stable and patient has hx of AVM. Differential for presentation of this includes Chronic GI bleed, Vasovagal, Dehydration, less likely Malignancy. Patient is very motivated to be out of hospital and would not like further work up regarding her presentation. Pt with hx Parkinson's Disease, CAD, HTN, Hyperlipidemia, Hypothyroidism, Paroxysmal Atrial Fibrillation, PE, Prediabetes, Anemia.    Assessment / Plan / Recommendation  Clinical Impression  Pt presents with clinical indicators of pharyngeal dysphagia.  Pt  required multiple swallows with all consistencies trialed especially with liquids (4 swallows with single sip of thin liquid).  There was immediate cough with serial sips of thin liquid x2.  There was delayed, dry cough with solid. Pt  endorsed that cracker made her cough and that liquid wash was benefical to clear.  With nectar thick liquid, pt still required multiple swallows, but there was no cough or change in vocal quality noted.  Pt tolerated soft solids without cough or feeling of stasis.  Pt exhibited good oral clearance with all consistencies.  Family reports coughing at home in absence of POs as well. Pt has hx of PD.  No prior swallow eval in EMR.  Pt would benefit from instrumental swallow evaluation.  Will plan for MBS as radiology schedule permits.  Recommend mechanical soft solids with nectar thick liquid pending results of objective swallow study. SLP Visit Diagnosis: Dysphagia, unspecified (R13.10)    Aspiration Risk  Mild aspiration risk    Diet Recommendation Dysphagia 3 (Mech soft);Nectar-thick liquid    Liquid Administration via: Cup;Straw Medication Administration: Whole meds with liquid Compensations: Slow rate;Small sips/bites Postural Changes: Seated upright at 90 degrees    Other  Recommendations Oral Care Recommendations: Oral care BID    Recommendations for follow up therapy are one component of a multi-disciplinary discharge planning process, led by the attending physician.  Recommendations may be updated based on patient status, additional functional criteria and insurance authorization.  Follow up Recommendations  (TBD)      Assistance Recommended at Discharge  N/A  Functional Status Assessment  (TBD)  Frequency and Duration  (TBD)          Prognosis Prognosis for improved oropharyngeal function:  (TBD)      Swallow Study   General HPI: Anna Bernard is a 72 y.o. female presenting with syncopal episode yesterday after having a BM. In the ED she was also found to have UTI with gross hematuria and dark, melanotic stool. Hemoglobin was stable and patient has hx of AVM. Differential for presentation of this includes Chronic GI bleed, Vasovagal, Dehydration, less likely Malignancy.  Patient is very motivated to be out of hospital and would not like further work up regarding her presentation. Pt with hx Parkinson's Disease, CAD, HTN, Hyperlipidemia, Hypothyroidism, Paroxysmal Atrial Fibrillation, PE, Prediabetes, Anemia. Type of Study: Bedside Swallow Evaluation Previous Swallow Assessment: none Diet Prior to this Study: Regular;Thin liquids (Level 0) Temperature Spikes Noted: No Respiratory Status: Room air History of Recent Intubation: No Behavior/Cognition: Alert;Cooperative Oral Cavity Assessment: Within Functional Limits Oral Care Completed by SLP: No Oral Cavity - Dentition: Adequate natural dentition Patient Positioning: Upright in bed Baseline Vocal Quality: Low vocal intensity Volitional Cough: Weak Volitional Swallow: Able to elicit    Oral/Motor/Sensory Function Overall Oral Motor/Sensory Function: Mild impairment Facial ROM: Reduced right;Reduced left Facial Symmetry: Within Functional Limits Lingual ROM: Within Functional Limits Lingual Symmetry: Within Functional Limits Lingual Strength: Reduced Velum:  (reduced elevation, ?effort) Mandible: Within Functional Limits   Ice Chips Ice chips: Not tested   Thin Liquid Thin Liquid: Impaired Pharyngeal  Phase Impairments: Cough - Immediate;Multiple swallows    Nectar Thick Nectar Thick Liquid: Impaired Pharyngeal Phase Impairments: Multiple swallows   Honey Thick Honey Thick Liquid: Not tested   Puree Puree: Impaired Pharyngeal Phase Impairments: Multiple swallows   Solid     Solid: Impaired Pharyngeal Phase Impairments: Cough - Delayed;Multiple swallows      Kerrie Pleasure, MA, CCC-SLP Acute Rehabilitation Services Office: [phone]  03/30/2023,10:21 AM

## 2023-03-30 NOTE — Assessment & Plan Note (Addendum)
Patient and husband amenable to GOC discussion with palliative. Broached the concept of symptomatic treatment and minmizing aggressive interventions/frequent hospitalizations. Palliative on board, see note.  - Awaiting placement

## 2023-03-30 NOTE — Assessment & Plan Note (Signed)
NSR on exam. - Cardiac monitoring - continue patient on metoprolol 12.5 mg BID

## 2023-03-30 NOTE — Progress Notes (Signed)
Daily Progress Note Intern Pager: [phone]  Patient name: Anna Bernard Medical record number: [id number] Date of birth: Aug 15, 1951 Age: 72 y.o. Gender: female  Primary Care Provider: Billey Co, MD Consultants: palliative Code Status: full  Pt Overview and Major Events to Date:  7/8- patient admitted, started abx for UTI  7/9- + FOB, discussion with palliative regarding GOC 7/10- patient and family discussed goals of care and both agree to SNF placement  Assessment and Plan: Anna Bernard is a 72 y.o. female presenting with syncopal episode 7/8 after having a BM. In the ED she was also found to have UTI with gross hematuria and dark stool questionable for melena. Hemoglobin was stable and patient has hx of AVM. Differential for presentation of this includes Chronic GI bleed, Vasovagal, Dehydration, less likely Malignancy. Patient is very motivated to be out of hospital and would not like further work up regarding her presentation.   [facility]     * (Principal) Syncope     Likely multifactorial- general progressive decline, Parkinson's  disease, sedentary with orthostasis. Patient has hx of GI AVMs but less  likely considering patient Hg stable.  - PT/OT reccommended SNF - As patient has hx of GI AVMS, will continue protonix - Fall and delirium precautions        Parkinson disease     Rigidity on exam with resting tremor. - Continue carbodopa-levodopa. Per neurology, trialed off of medication  and patient felt significantly worse        Paroxysmal atrial fibrillation (HCC)     NSR on exam. - Cardiac monitoring - continue patient on metoprolol 12.5 mg BID        Pulmonary embolus (HCC)     Hx PE. Not on anticoagulation given bleeding risk.  - SCDs        ESBL (extended spectrum beta-lactamase) producing bacteria infection     UA concerning for UTI (leuks, many bacteria). Question underlying  colonization, but given presence of dysuria/infectious  symptoms, will  treat. Patient has hx of UTI. S/p 1 dose CTX in ED. Treated w/ PO  Fosfomycin 3 mg x1 given ESBL producing organisms on urine cultures for  the past 4 years (11/17/22, 05/19/22, 01/16/22, 09/02/19) - will continue to monitor sxs         Goals of care, counseling/discussion     Patient and husband amenable to GOC discussion with palliative.  Broached the concept of symptomatic treatment and minmizing aggressive  interventions/frequent hospitalizations. Palliative on board, see note.  - Awaiting placement        Generalized weakness     Patient has generalized weakness, likely multifactorial with old age,  parkinsons, and decline in cognition - PT/OT following IP, recommended patient be placed at SNF for rehab       FEN/GI: Full PPx: SCDs Dispo:SNF  pending placement . Barriers include placement.   Subjective:  Patient sleepy, says she would like to sleep.   Objective: Temp:  [97.4 F (36.3 C)-99.1 F (37.3 C)] 99.1 F (37.3 C) (07/11 0727) Pulse Rate:  [77-82] 82 (07/11 0727) Resp:  [15-18] 18 (07/11 0727) BP: (119-150)/(56-73) 140/64 (07/11 0727) SpO2:  [96 %-100 %] 100 % (07/11 0727)   Physical Exam: General: frail, laying in bed  Cardiovascular: RRR, no M/R/G Respiratory: normal resp effort, normal breath sounds Abdomen: bowel sounds are normal Extremities: patient is weak and having difficulty ambulating   Laboratory: Most recent CBC Lab  Results  Component Value Date   WBC 3.6 (L) 03/28/2023   HGB 11.1 (L) 03/28/2023   HCT 33.0 (L) 03/28/2023   MCV 74.3 (L) 03/28/2023   PLT 235 03/28/2023   Most recent BMP    Latest Ref Rng & Units 03/27/2023    2:30 PM  BMP  Glucose 70 - 99 mg/dL 829   BUN 8 - 23 mg/dL 13   Creatinine 5.62 - 1.00 mg/dL 1.30   Sodium 865 - 784 mmol/L 139   Potassium 3.5 - 5.1 mmol/L 3.9   Chloride 98 - 111 mmol/L 105   CO2 22 - 32 mmol/L 20   Calcium 8.9 - 10.3 mg/dL 8.1     Other pertinent labs     Imaging/Diagnostic Tests:  Cyd Silence, MD 03/30/2023, 1:26 PM  PGY-1, [facility] FPTS Intern pager: [phone], text pages welcome Secure chat group [facility] Teaching Service

## 2023-03-30 NOTE — [place] FL2 (Signed)
[place] MEDICAID FL2 LEVEL OF CARE FORM     IDENTIFICATION  Patient Name: Anna Bernard Birthdate: November 24, 1950 Sex: female Admission Date (Current Location): 03/27/2023  [facility] and [place] Number:  Producer, television/film/video and Address:  [facility], 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 coli infection 04/29/2022   Need for home health care 04/29/2022   Hypothyroidism 03/03/2022   Pulmonary embolus (HCC)    Non-ST elevation (NSTEMI) myocardial infarction [facility])    Paroxysmal atrial fibrillation (HCC)    Chronic bilateral low back pain without sciatica 01/14/2022   Seborrheic keratoses 07/09/2021   Parkinson disease 04/23/2020   Chronic venous insufficiency 11/26/2019   Vitamin B12 deficiency 02/06/2018   Iron deficiency anemia due to chronic blood loss 01/29/2018   Pure  hypercholesterolemia 12/24/2014   Diabetes mellitus type 2, controlled, without complications (HCC) 12/24/2014   Bruit of right carotid artery 12/24/2014   Thyroid disease 07/06/2014   Severe obesity (BMI >= 40) (HCC) 07/06/2014   Essential hypertension 07/06/2014    Orientation RESPIRATION BLADDER Height & Weight     Self, Situation, Place  Normal Incontinent, External catheter (External Urinary Catheter) Weight: 158 lb 11.7 oz (72 kg) Height:  5\' 1"  (154.9 cm)  BEHAVIORAL SYMPTOMS/MOOD NEUROLOGICAL BOWEL NUTRITION STATUS      Incontinent Diet (Please see Discharge Summary)  AMBULATORY STATUS COMMUNICATION OF NEEDS Skin   Extensive Assist Verbally Other (Comment) (WDL,Wound/Incision LDAs)                       Personal Care Assistance Level of Assistance  Bathing, Feeding, Dressing Bathing Assistance: Maximum assistance Feeding assistance: Limited assistance Dressing Assistance: Maximum assistance     Functional Limitations Info  Sight, Hearing, Speech Sight Info: Adequate Hearing Info: Adequate Speech Info: Adequate    SPECIAL CARE FACTORS FREQUENCY  PT (By licensed PT), OT (By licensed OT)     PT Frequency: 5x min weekly OT Frequency: 5x min weekly            Contractures Contractures Info: Not present    Additional Factors Info  Code Status, Allergies, Isolation Precautions Code Status Info: FULL Allergies Info: NKA     Isolation Precautions Info: ESBL     Current Medications (03/30/2023):  This is the current hospital active medication list Current Facility-Administered Medications  Medication Dose Route Frequency Provider Last Rate Last Admin   acetaminophen (TYLENOL)  tablet 1,000 mg  1,000 mg Oral Q6H PRN Darnelle Spangle B, MD   1,000 mg at 03/28/23 1131   Carbidopa-Levodopa ER (SINEMET CR) 25-100 MG tablet controlled release 1 tablet  1 tablet Oral Q24H Alicia Amel, MD   1 tablet at 03/29/23 2049   Carbidopa-Levodopa ER (SINEMET CR) 25-100 MG  tablet controlled release 2 tablet  2 tablet Oral TID Hammons, Gerhard Munch, RPH   2 tablet at 03/30/23 1028   levothyroxine (SYNTHROID) tablet 100 mcg  100 mcg Oral QAC breakfast Darnelle Spangle B, MD   100 mcg at 03/30/23 1029   metoprolol tartrate (LOPRESSOR) tablet 12.5 mg  12.5 mg Oral BID Cyndia Skeeters, DO   12.5 mg at 03/30/23 1029   pantoprazole (PROTONIX) EC tablet 40 mg  40 mg Oral Daily Alicia Amel, MD   40 mg at 03/30/23 1043   rosuvastatin (CRESTOR) tablet 10 mg  10 mg Oral QPM Alicia Amel, MD   10 mg at 03/29/23 1853     Discharge Medications: Please see discharge summary for a list of discharge medications.  Relevant Imaging Results:  Relevant Lab Results:   Additional Information [id number]  Briana Newman A [place], [place]

## 2023-03-30 NOTE — Assessment & Plan Note (Signed)
Likely multifactorial- general progressive decline, Parkinson's disease, sedentary with orthostasis. Patient has hx of GI AVMs but less likely considering patient Hg stable.  - PT/OT reccommended SNF - As patient has hx of GI AVMS, will continue protonix - Fall and delirium precautions

## 2023-03-30 NOTE — Plan of Care (Signed)
  Problem: Nutrition: Goal: Adequate nutrition will be maintained Outcome: Progressing   Problem: Coping: Goal: Level of anxiety will decrease Outcome: Progressing   Problem: Elimination: Goal: Will not experience complications related to bowel motility Outcome: Progressing   

## 2023-03-30 NOTE — Progress Notes (Signed)
Occupational Therapy Treatment Patient Details Name: Anna Bernard MRN: [id number] DOB: 1951/01/09 Today's Date: 03/30/2023   History of present illness 72 y.o. female presents to [facility] hospital on 03/27/2023 after syncopal episode. Pt found to have UTI. PMH includes recurrent syncopal episodes, Parkinson disease, PAF, PE, CAD, HTN, prediabetes.   OT comments  Pt slowly progressing toward established OT goals. Pt with blatant difficulty coordinating meaningful movement on arrival, requiring cueing to make larger movements to wipe her face. Coming to EOB with max multimodal cues and mod A. Facilitating multisystem involvement with pt using eye boosts to guide her UE movement. Focusing session on key skills affected in individuals with PD. Working on anterior weight shift as precursor to transfers at EOB as pt with retropulsion in sititn; pt benefiting from multimodal cues and goal directed instruction to optimize movement quality. Pt dizzy EOB and engaged in BUE and BLE HEP with again focus on large amplitude, goal directed movement. Educating regarding small, slow movement being symptom of PD. Continued dizziness, and with low BP so return to supine for safety. Will continue to follow.    Recommendations for follow up therapy are one component of a multi-disciplinary discharge planning process, led by the attending physician.  Recommendations may be updated based on patient status, additional functional criteria and insurance authorization.    Assistance Recommended at Discharge Frequent or constant Supervision/Assistance  Patient can return home with the following  A lot of help with walking and/or transfers;A lot of help with bathing/dressing/bathroom;Direct supervision/assist for medications management;Assistance with cooking/housework;Direct supervision/assist for financial management;Assist for transportation;Help with stairs or ramp for entrance   Equipment Recommendations  None recommended by OT     Recommendations for Other Services      Precautions / Restrictions Precautions Precautions: Fall Precaution Comments: Parkinson disease with history of syncope Restrictions Weight Bearing Restrictions: No       Mobility Bed Mobility Overal bed mobility: Needs Assistance Bed Mobility: Supine to Sit, Sit to Supine     Supine to sit: Mod assist Sit to supine: Max assist   General bed mobility comments: Mod A with significant multimodal cueing and assist for truncal elevation and light guidance for BLE to EOB. Max A for all aspects to return to supine    Transfers                   General transfer comment: deferred due to dizziness.     Balance Overall balance assessment: Needs assistance Sitting-balance support: Single extremity supported, Bilateral upper extremity supported, Feet unsupported Sitting balance-Leahy Scale: Poor Sitting balance - Comments: Min A with retropulsion sitting EOB                                   ADL either performed or assessed with clinical judgement   ADL Overall ADL's : Needs assistance/impaired     Grooming: Min guard;Wash/dry hands;Wash/dry face;Sitting Grooming Details (indicate cue type and reason): EOB                               General ADL Comments: posterior lean in sitting.    Extremity/Trunk Assessment              Vision   Additional Comments: eyes closed much of session; does undershoot when reaching but also has neuromuscular disease (Pd) affecting dysmetria   Perception  Praxis      Cognition Arousal/Alertness: Awake/alert Behavior During Therapy: Flat affect Overall Cognitive Status: Impaired/Different from baseline Area of Impairment: Orientation, Attention, Memory, Following commands, Safety/judgement, Awareness, Problem solving                 Orientation Level: Disoriented to, Time, Situation Current Attention Level: Sustained Memory: Decreased  short-term memory Following Commands: Follows one step commands with increased time Safety/Judgement: Decreased awareness of safety, Decreased awareness of deficits Awareness: Intellectual Problem Solving: Slow processing, Decreased initiation, Difficulty sequencing General Comments: Pt with eyes closed frequently during session and delayed initiation to commands. Dizziness at EOB, but not reporting until directly asked. Constant cues for larger movements despite OT telling pt to work on this several times throughout session. Max undershooting when reaching with poor awareness of how to correct        Exercises Exercises: General Lower Extremity, Other exercises General Exercises - Lower Extremity Ankle Circles/Pumps: AROM, Both, 10 reps Long Arc Quad: AROM, Right, Left, 5 reps, Seated Hip Flexion/Marching: AROM, Right, Left, 10 reps, Seated (multimodal cues for performance EOB) Other Exercises Other Exercises: Forward punches BUE 10x ea Other Exercises: Sitting EOB working on anterior weight shift with leaning forward to touch therapist's hands with shoulders as goal directed action/    Shoulder Instructions       General Comments BP 92/46 (55) at EOB with report of dizziness and even after BUE and BLE HEP. returned to supine for pt safety and due to history of syncope BP up to 104/76 with return to supine ~5 mins.    Pertinent Vitals/ Pain       Pain Assessment Pain Assessment: Faces Faces Pain Scale: No hurt Pain Intervention(s): Monitored during session  Home Living                                          Prior Functioning/Environment              Frequency  Min 1X/week        Progress Toward Goals  OT Goals(current goals can now be found in the care plan section)  Progress towards OT goals: Progressing toward goals (slowly)  Acute Rehab OT Goals Time For Goal Achievement: 04/11/23 Potential to Achieve Goals: Good ADL Goals Pt Will  Perform Grooming: with min assist;standing (one task) Pt Will Perform Lower Body Bathing: sit to/from stand;with min assist Pt Will Perform Lower Body Dressing: with mod assist;sit to/from stand Pt Will Transfer to Toilet: bedside commode;stand pivot transfer;with min assist Pt Will Perform Toileting - Clothing Manipulation and hygiene: with min assist;sit to/from stand Additional ADL Goal #1: Pt will transfer from supine to sit EOB with min assist in preparation for selfcare tasks.  Plan Discharge plan remains appropriate;Frequency remains appropriate    Co-evaluation                 AM-PAC OT "6 Clicks" Daily Activity     Outcome Measure   Help from another person eating meals?: A Little Help from another person taking care of personal grooming?: A Little Help from another person toileting, which includes using toliet, bedpan, or urinal?: A Lot Help from another person bathing (including washing, rinsing, drying)?: A Lot Help from another person to put on and taking off regular upper body clothing?: A Lot Help from another person to put on and taking off regular  lower body clothing?: A Lot 6 Click Score: 14    End of Session    OT Visit Diagnosis: Unsteadiness on feet (R26.81);Other abnormalities of gait and mobility (R26.89);Repeated falls (R29.6);Muscle weakness (generalized) (M62.81);Other symptoms and signs involving cognitive function   Activity Tolerance Patient limited by lethargy (limited by dizziness)   Patient Left in bed;with call bell/phone within reach;with family/visitor present   Nurse Communication Mobility status        Time: 2956-2130 OT Time Calculation (min): 35 min  Charges: OT General Charges $OT Visit: 1 Visit OT Treatments $Self Care/Home Management : 8-22 mins $Therapeutic Exercise: 8-22 mins  Tyler Deis, OTR/L [facility] Acute Rehabilitation Office: [phone]   Myrla Halsted 03/30/2023, 5:39 PM

## 2023-03-31 DIAGNOSIS — Z1612 Extended spectrum beta lactamase (ESBL) resistance: Secondary | ICD-10-CM | POA: Diagnosis not present

## 2023-03-31 DIAGNOSIS — G20A1 Parkinson's disease without dyskinesia, without mention of fluctuations: Secondary | ICD-10-CM | POA: Diagnosis not present

## 2023-03-31 DIAGNOSIS — A499 Bacterial infection, unspecified: Secondary | ICD-10-CM | POA: Diagnosis not present

## 2023-03-31 DIAGNOSIS — R5381 Other malaise: Secondary | ICD-10-CM | POA: Diagnosis not present

## 2023-03-31 NOTE — Assessment & Plan Note (Addendum)
Patient with multifactorial debility-Parkinson's, cognitive decline, age. - PT/OT continue to follow IP, recommended patient be placed at [facility] for rehab

## 2023-03-31 NOTE — Assessment & Plan Note (Addendum)
NSR on exam. -Continue metoprolol 12.5 mg BID

## 2023-03-31 NOTE — Progress Notes (Incomplete)
Daily Progress Note Intern Pager: [phone]  Patient name: Anna Bernard Medical record number: [id number] Date of birth: 11-10-1950 Age: 72 y.o. Gender: female  Primary Care Provider: Billey Co, MD Consultants: Palliative Code Status: Full  Pt Overview and Major Events to Date:  7/8- patient admitted, started abx for UTI  7/9- + FOB, discussion with palliative regarding GOC 7/10- patient and family discussed goals of care and both agree to SNF placement 7/11- awaiting SNF authorization  Assessment and Plan: Anna Bernard is a 72 y.o. female presenting with syncopal episode 7/8 after having a BM. In the ED she was also found to have UTI with gross hematuria and dark stool questionable for melena. Patient has hx of small bowel AVM, though Hg was stable.***   ***. [facility]     * (Principal) ESBL (extended spectrum beta-lactamase) producing  bacteria infection     On admission UA was concerning for UTI (leuks, many bacteria). Treated  w/ PO Fosfomycin 3 mg x1 given ESBL producing organisms on urine cultures  for the past 4 years (11/17/22, 05/19/22, 01/16/22, 09/02/19). - will continue to monitor sxs         Parkinson disease     Continues to have rigidity on exam with resting tremor. - Continue carbodopa-levodopa. Per neurology, trialed off of medication  and patient felt significantly worse.        Paroxysmal atrial fibrillation (HCC)     NSR on exam. - continue patient on metoprolol 12.5 mg BID        Pulmonary embolus (HCC)     Hx PE. Not on anticoagulation given bleeding risk.  - SCDs        Syncope     Likely multifactorial- general progressive decline, Parkinson's  disease, sedentary with orthostasis. Patient has hx of GI AVMs but less  likely considering patient Hg stable.  - PT/OT recommended SNF - As patient has hx of GI AVMS, will continue protonix - Fall and delirium precautions        Goals of care, counseling/discussion      Patient and husband have had GOC discussion with palliative.  Patient  and family agreeable to SNF placement. -Appreciate palliative recs -Anticipate discharge soon pending SNF placement         Debility     Patient has generalized weakness, likely multifactorial with old age,  parkinsons, and decline in cognition - PT/OT continue to follow IP, recommended patient be placed at SNF for  rehab       FEN/GI: Dysphagia 3/soft mechanical diet PPx: SCDs Dispo:SNF  pending authorization . Barriers include placement.  Subjective:  ***  Objective: Temp:  [98.4 F (36.9 C)-100 F (37.8 C)] 100 F (37.8 C) (07/12 1943) Pulse Rate:  [71-79] 75 (07/12 1943) Resp:  [16-18] 16 (07/12 1943) BP: (128-167)/(73-83) 154/83 (07/12 1943) SpO2:  [98 %-99 %] 98 % (07/12 1943) Weight:  [73.6 kg] 73.6 kg (07/12 0441) Physical Exam: General: *** Cardiovascular: *** Respiratory: *** Abdomen: *** Extremities: ***  Laboratory: Most recent CBC Lab Results  Component Value Date   WBC 3.6 (L) 03/28/2023   HGB 11.1 (L) 03/28/2023   HCT 33.0 (L) 03/28/2023   MCV 74.3 (L) 03/28/2023   PLT 235 03/28/2023   Most recent BMP    Latest Ref Rng & Units 03/27/2023    2:30 PM  BMP  Glucose 70 - 99 mg/dL 147   BUN 8 - 23  mg/dL 13   Creatinine 1.61 - 1.00 mg/dL 0.96   Sodium 045 - 409 mmol/L 139   Potassium 3.5 - 5.1 mmol/L 3.9   Chloride 98 - 111 mmol/L 105   CO2 22 - 32 mmol/L 20   Calcium 8.9 - 10.3 mg/dL 8.1      Anna Skeeters, DO 03/31/2023, 11:23 PM  PGY-1, [facility] Family Medicine FPTS Intern pager: [phone], text pages welcome Secure chat group [facility] Teaching Service

## 2023-03-31 NOTE — Assessment & Plan Note (Signed)
Patient and husband amenable to GOC discussion with palliative. Broached the concept of symptomatic treatment and minmizing aggressive interventions/frequent hospitalizations. Palliative on board, see note. Patient and family agreeable to SNF.  - SNF placement

## 2023-03-31 NOTE — Assessment & Plan Note (Signed)
NSR on exam. - Cardiac monitoring - continue patient on metoprolol 12.5 mg BID 

## 2023-03-31 NOTE — Assessment & Plan Note (Signed)
Hx PE. Not on anticoagulation given bleeding risk.  - SCDs 

## 2023-03-31 NOTE — Plan of Care (Signed)

## 2023-03-31 NOTE — Assessment & Plan Note (Addendum)
Continues to have rigidity on exam with resting tremor. - Continue carbodopa-levodopa. Per neurology, trialed off of medication and patient felt significantly worse.

## 2023-03-31 NOTE — TOC Progression Note (Signed)
Transition of Care [facility]) - Progression Note    Patient Details  Name: Anna Bernard MRN: [id number] Date of Birth: [birth year]  Transition of Care [facility]) CM/SW Contact  Tieisha Darden A [place], [place] Phone Number: 03/31/2023, 3:24 PM  Clinical Narrative:     CSW contacted pt's daughter, Sarita Haver, she was unable to visit facility, [place]. She stated that  leaning towards [facility] her selection and will be able to decide after she visits.   CSW will follow up at another more opportune time regarding choice and proceed with disposition plan to SNF.   TOC will continue to follow.   Expected Discharge Plan: Skilled Nursing Facility Barriers to Discharge: Continued Medical Work up  Expected Discharge Plan and Services In-house Referral: Clinical Social Work     Living arrangements for the past 2 months: Single Family Home                                       Social Determinants of Health (SDOH) Interventions SDOH Screenings   Food Insecurity: No Food Insecurity (03/28/2023)  Housing: Patient Declined (03/28/2023)  Transportation Needs: No Transportation Needs (03/28/2023)  Utilities: At Risk (03/28/2023)  Alcohol Screen: Low Risk  (01/05/2023)  Depression (PHQ2-9): Low Risk  (01/05/2023)  Financial Resource Strain: Low Risk  (01/05/2023)  Physical Activity: Inactive (01/05/2023)  Social Connections: Moderately Isolated (01/05/2023)  Stress: No Stress Concern Present (01/05/2023)  Tobacco Use: Medium Risk (03/28/2023)    Readmission Risk Interventions    01/17/2023    1:37 PM  Readmission Risk Prevention Plan  Transportation Screening Complete  PCP or Specialist Appt within 5-7 Days Complete  Home Care Screening Complete  Medication Review (RN CM) Referral to Pharmacy

## 2023-03-31 NOTE — Progress Notes (Signed)
Brief Palliative Medicine Progress Note:  PMT following peripherally for needs/decline:  Medical records reviewed including progress notes, labs, imaging. Patient remains medically stable for discharge. Goals are clear for patient's discharge to SNF rehab with outpatient Palliative Care to follow and full code/full scope. TOC working with family regarding disposition - family considering facility options at this time.  PMT will continue to follow peripherally. If there are any imminent needs please call the service directly. Family also has PMT contact information should further needs arise.  Thank you for allowing PMT to assist in the care of this patient.  Bryanne Riquelme M. Katrinka Blazing [facility] Palliative Medicine Team Team Phone: [phone] NO CHARGE

## 2023-03-31 NOTE — Assessment & Plan Note (Addendum)
On admission UA was concerning for UTI (leuks, many bacteria). Treated w/ PO Fosfomycin 3 mg x1 given ESBL producing organisms on urine cultures for the past 4 years (11/17/22, 05/19/22, 01/16/22, 09/02/19). - will continue to monitor sxs

## 2023-03-31 NOTE — Assessment & Plan Note (Signed)
Likely multifactorial- general progressive decline, Parkinson's disease, sedentary with orthostasis. Patient has hx of GI AVMs but less likely considering patient Hg stable.  - PT/OT recommended SNF - As patient has hx of GI AVMS, will continue protonix - Fall and delirium precautions

## 2023-03-31 NOTE — Progress Notes (Signed)
Physical Therapy Treatment Patient Details Name: Anna Bernard MRN: [id number] DOB: 12-May-1951 Today's Date: 03/31/2023   History of Present Illness 72 y.o. female presents to [facility] hospital on 03/27/2023 after syncopal episode. Pt found to have UTI. PMH includes recurrent syncopal episodes, Parkinson disease, PAF, PE, CAD, HTN, prediabetes.    PT Comments  Pt with continued progress towards acute goals. Pt continues to need multimodal cues for all mobility with pt demonstrating difficulty initiating and sequencing movements needing step by step and hand over hand to compelte. Pt needing mod A to come to sit EOB with continued focus on large amplitude movements with LE therex and seated balance exercises. Pt continues to demonstrate posterior lean in sitting with some retropulsion, needing max multimodal cues to correct and maintain upright posture. Pt with continued c/o dizziness with sitting up EOB, further transfers deferred due to symptoms. Pt continues to benefit from skilled PT services to progress toward functional mobility goals.      Assistance Recommended at Discharge Frequent or constant Supervision/Assistance  If plan is discharge home, recommend the following:  Can travel by private vehicle    Two people to help with walking and/or transfers;A lot of help with bathing/dressing/bathroom;Assistance with cooking/housework;Direct supervision/assist for medications management;Direct supervision/assist for financial management;Assist for transportation;Help with stairs or ramp for entrance   No  Equipment Recommendations  Hospital bed (hoyer lift)    Recommendations for Other Services       Precautions / Restrictions Precautions Precautions: Fall Precaution Comments: Parkinson disease with history of syncope Restrictions Weight Bearing Restrictions: No     Mobility  Bed Mobility Overal bed mobility: Needs Assistance Bed Mobility: Supine to Sit, Sit to Supine     Supine to  sit: Mod assist Sit to supine: Max assist   General bed mobility comments: Mod A with significant multimodal cueing and assist for truncal elevation and light guidance for BLE to EOB. Max A for all aspects to return to supine    Transfers Overall transfer level: Needs assistance                 General transfer comment: deferred due to dizziness    Ambulation/Gait                   Stairs             Wheelchair Mobility     Tilt Bed    Modified Rankin (Stroke Patients Only)       Balance Overall balance assessment: Needs assistance Sitting-balance support: Single extremity supported, Bilateral upper extremity supported, Feet unsupported Sitting balance-Leahy Scale: Poor Sitting balance - Comments: Min A with retropulsion sitting EOB Postural control: Right lateral lean, Posterior lean                                  Cognition Arousal/Alertness: Awake/alert Behavior During Therapy: Flat affect Overall Cognitive Status: Impaired/Different from baseline Area of Impairment: Orientation, Attention, Memory, Following commands, Safety/judgement, Awareness, Problem solving                 Orientation Level: Disoriented to, Time, Situation Current Attention Level: Sustained Memory: Decreased short-term memory Following Commands: Follows one step commands with increased time Safety/Judgement: Decreased awareness of safety, Decreased awareness of deficits Awareness: Intellectual Problem Solving: Slow processing, Decreased initiation, Difficulty sequencing General Comments: Pt with eyes closed for most of session and delayed initiation to commands. Dizziness at EOB,  but not reporting until directly asked. Constant cues for larger movements despite Max undershooting when reaching with poor awareness of how to correct        Exercises General Exercises - Lower Extremity Ankle Circles/Pumps: AROM, Both, 10 reps, Supine Long Arc Quad:  AROM, Right, Left, 5 reps, Seated Heel Slides: AAROM, Both, 10 reps, Supine Hip Flexion/Marching: AROM, Right, Left, 10 reps, Seated (multimodal cues for performance EOB) Other Exercises Other Exercises: reaching with trunk rotation R/L x10    General Comments        Pertinent Vitals/Pain Pain Assessment Pain Assessment: No/denies pain Pain Intervention(s): Monitored during session    Home Living                          Prior Function            PT Goals (current goals can now be found in the care plan section) Acute Rehab PT Goals Patient Stated Goal: to improve functional mobility quality, reduce caregiver burden PT Goal Formulation: With patient Time For Goal Achievement: 04/11/23 Progress towards PT goals: Progressing toward goals    Frequency    Min 3X/week      PT Plan      Co-evaluation              AM-PAC PT "6 Clicks" Mobility   Outcome Measure  Help needed turning from your back to your side while in a flat bed without using bedrails?: A Lot Help needed moving from lying on your back to sitting on the side of a flat bed without using bedrails?: A Lot Help needed moving to and from a bed to a chair (including a wheelchair)?: A Lot Help needed standing up from a chair using your arms (e.g., wheelchair or bedside chair)?: A Lot Help needed to walk in hospital room?: Total Help needed climbing 3-5 steps with a railing? : Total 6 Click Score: 10    End of Session   Activity Tolerance: Patient tolerated treatment well;Patient limited by fatigue Patient left: in bed;with call bell/phone within reach;with family/visitor present Nurse Communication: Mobility status PT Visit Diagnosis: Other abnormalities of gait and mobility (R26.89);Muscle weakness (generalized) (M62.81);Other symptoms and signs involving the nervous system (R29.898)     Time: 1610-9604 PT Time Calculation (min) (ACUTE ONLY): 19 min  Charges:    $Therapeutic  Activity: 8-22 mins PT General Charges $$ ACUTE PT VISIT: 1 Visit                     Jori Frerichs R. PTA Acute Rehabilitation Services Office: [phone]   Catalina Antigua 03/31/2023, 12:50 PM

## 2023-03-31 NOTE — Progress Notes (Signed)
Daily Progress Note Intern Pager: [phone]  Patient name: Anna Bernard Medical record number: [id number] Date of birth: 1950/11/08 Age: 72 y.o. Gender: female  Primary Care Provider: Billey Co, MD Consultants: Dinah Beers Code Status: full  Pt Overview and Major Events to Date:  7/8- patient admitted, started abx for UTI  7/9- + FOB, discussion with palliative regarding GOC 7/10- patient and family discussed goals of care and both agree to SNF placement 7/11- awaiting SNF authorization  Assessment and Plan: Anna Bernard is a 72 y.o. female presenting with syncopal episode 7/8 after having a BM. In the ED she was also found to have UTI with gross hematuria and dark stool questionable for melena. Patient has hx of small bowel AVM, though Hg was stable. Patient likely Hemoglobin was stable and patient has hx of AVM. This is likely multifactorial and most likely related to balance issues and rigidity from Parkinson's disease.  [facility]     * (Principal) ESBL (extended spectrum beta-lactamase) producing  bacteria infection     UA concerning for UTI (leuks, many bacteria). Question underlying  colonization, but given presence of dysuria/infectious symptoms, will  treat. Patient has hx of UTI. S/p 1 dose CTX in ED. Treated w/ PO  Fosfomycin 3 mg x1 given ESBL producing organisms on urine cultures for  the past 4 years (11/17/22, 05/19/22, 01/16/22, 09/02/19) - will continue to monitor sxs         Parkinson disease     Rigidity on exam with resting tremor. - Continue carbodopa-levodopa. Per neurology, trialed off of medication  and patient felt significantly worse        Paroxysmal atrial fibrillation (HCC)     NSR on exam. - Cardiac monitoring - continue patient on metoprolol 12.5 mg BID        Pulmonary embolus (HCC)     Hx PE. Not on anticoagulation given bleeding risk.  - SCDs        Syncope     Likely multifactorial- general progressive decline,  Parkinson's  disease, sedentary with orthostasis. Patient has hx of GI AVMs but less  likely considering patient Hg stable.  - PT/OT reccommended SNF - As patient has hx of GI AVMS, will continue protonix - Fall and delirium precautions        Goals of care, counseling/discussion     Patient and husband amenable to GOC discussion with palliative.  Broached the concept of symptomatic treatment and minmizing aggressive  interventions/frequent hospitalizations. Palliative on board, see note.  Patient and family agreeable to SNF.  - SNF placement        Debility     Patient has generalized weakness, likely multifactorial with old age,  parkinsons, and decline in cognition - PT/OT following IP, recommended patient be placed at SNF for rehab       FEN/GI: Dysphagia 3 PPx: SCDs Dispo:SNF  pending authorization . Barriers include placement.   Subjective:  States she is hungry and would like to eat. Patient has no other complaints at this time.   Objective: Temp:  [97.2 F (36.2 C)-98.6 F (37 C)] 98.6 F (37 C) (07/12 0823) Pulse Rate:  [71-81] 78 (07/12 0823) Resp:  [16-18] 16 (07/12 0823) BP: (104-144)/(54-78) 144/73 (07/12 0823) SpO2:  [95 %-100 %] 99 % (07/12 0823) Weight:  [73.6 kg] 73.6 kg (07/12 0441) Physical Exam: General: frail, sitting up in bed Cardiovascular: RRR, no M/R/G Respiratory: NWOB, CTAB Abdomen: soft,  nontender, nondistended Extremities: weak with difficulty ambulating  Laboratory: Most recent CBC Lab Results  Component Value Date   WBC 3.6 (L) 03/28/2023   HGB 11.1 (L) 03/28/2023   HCT 33.0 (L) 03/28/2023   MCV 74.3 (L) 03/28/2023   PLT 235 03/28/2023   Most recent BMP    Latest Ref Rng & Units 03/27/2023    2:30 PM  BMP  Glucose 70 - 99 mg/dL 161   BUN 8 - 23 mg/dL 13   Creatinine 0.96 - 1.00 mg/dL 0.45   Sodium 409 - 811 mmol/L 139   Potassium 3.5 - 5.1 mmol/L 3.9   Chloride 98 - 111 mmol/L 105   CO2 22 - 32 mmol/L 20   Calcium 8.9  - 10.3 mg/dL 8.1     Tharon Bomar Georg Ruddle, MD 03/31/2023, 11:39 AM  PGY-1, [facility] FPTS Intern pager: [phone], text pages welcome Secure chat group [facility] Teaching Service

## 2023-03-31 NOTE — Assessment & Plan Note (Addendum)
Patient and husband have had GOC discussion with palliative.  Patient and family agreeable to SNF placement. -Appreciate palliative recs -Anticipate discharge soon pending SNF placement

## 2023-03-31 NOTE — TOC Progression Note (Signed)
Transition of Care [facility]) - Progression Note    Patient Details  Name: Anna Bernard MRN: [id number] Date of Birth: 03/06/1951  Transition of Care [facility]) CM/SW Contact  Anaka Beazer A [place], [place] Phone Number: 03/31/2023, 11:08 AM  Clinical Narrative:     CSW met with pt, pt's daughter,  Sarita Haver and pt's husband [name] at bedside.   CSW informed them that their choice at [facility] was declined due to lack of bed availability.   They were interested in visiting [facility] today and CSW provided contact information to [place] at [facility] to set up tour. CSW explained SNF process regarding insurance authorization, medical stability, and bed availability, as well as ambulance transport at discharge. There were no other questions from family.   CSW will follow up regarding decision for SNF selection.   TOC will continue to follow.   Expected Discharge Plan: Skilled Nursing Facility Barriers to Discharge: Continued Medical Work up  Expected Discharge Plan and Services In-house Referral: Clinical Social Work     Living arrangements for the past 2 months: Single Family Home                                       Social Determinants of Health (SDOH) Interventions SDOH Screenings   Food Insecurity: No Food Insecurity (03/28/2023)  Housing: Patient Declined (03/28/2023)  Transportation Needs: No Transportation Needs (03/28/2023)  Utilities: At Risk (03/28/2023)  Alcohol Screen: Low Risk  (01/05/2023)  Depression (PHQ2-9): Low Risk  (01/05/2023)  Financial Resource Strain: Low Risk  (01/05/2023)  Physical Activity: Inactive (01/05/2023)  Social Connections: Moderately Isolated (01/05/2023)  Stress: No Stress Concern Present (01/05/2023)  Tobacco Use: Medium Risk (03/28/2023)    Readmission Risk Interventions    01/17/2023    1:37 PM  Readmission Risk Prevention Plan  Transportation Screening Complete  PCP or Specialist Appt within 5-7 Days Complete  Home Care Screening Complete   Medication Review (RN CM) Referral to Pharmacy

## 2023-04-01 DIAGNOSIS — R5381 Other malaise: Secondary | ICD-10-CM | POA: Diagnosis not present

## 2023-04-01 DIAGNOSIS — A499 Bacterial infection, unspecified: Secondary | ICD-10-CM | POA: Diagnosis not present

## 2023-04-01 DIAGNOSIS — Z1612 Extended spectrum beta lactamase (ESBL) resistance: Secondary | ICD-10-CM | POA: Diagnosis not present

## 2023-04-01 MED ORDER — PROCHLORPERAZINE MALEATE 10 MG PO TABS
10.0000 mg | ORAL_TABLET | Freq: Once | ORAL | Status: AC
  Administered 2023-04-01: 10 mg via ORAL
  Filled 2023-04-01: qty 1

## 2023-04-01 NOTE — Progress Notes (Signed)
Provider Mason Jim) paged regarding Pt vitals (HR=110 BP= 178/82). No PRN medications available at this time. Pt is asymptomatic. Provider stated that she would discuss vitals with McDiamid MD. Melissa Noon MD stated Via epic chat that there will be no new orders and the team will continue to monitor PT. Nursing plan of care ongoing.

## 2023-04-01 NOTE — Plan of Care (Signed)

## 2023-04-01 NOTE — Assessment & Plan Note (Signed)
Blood pressures somewhat labile, elevated this morning. -Will continue to monitor

## 2023-04-02 DIAGNOSIS — R5381 Other malaise: Secondary | ICD-10-CM | POA: Diagnosis not present

## 2023-04-02 DIAGNOSIS — G20A1 Parkinson's disease without dyskinesia, without mention of fluctuations: Secondary | ICD-10-CM | POA: Diagnosis not present

## 2023-04-02 NOTE — Assessment & Plan Note (Signed)
Normotensive.  Will continue to monitor 

## 2023-04-02 NOTE — Progress Notes (Signed)
Daily Progress Note Intern Pager: [phone]  Patient name: [name] Medical record number: [id number] Date of birth: 01-Dec-1950 Age: 72 y.o. Gender: female  Primary Care Provider: Billey Co, MD Consultants: Palliative Code Status: FULL  Pt Overview and Major Events to Date:  7/8- patient admitted, fosfomycin 3 g x 1 7/9- + FOB, discussion with palliative regarding GOC 7/10- patient and family discussed goals of care and both agree to SNF placement 7/14- awaiting SNF   Assessment and Plan:  SHANELLE MAKOWSKI is a 72 y.o. female presenting with syncopal episode 7/8 after having a BM. In the ED she was also found to have UTI with gross hematuria (since treated with antibiotic) and dark stool questionable for melena.  Hemoglobin remains stable.   Going to SNF for balance concern and Parkinsonian rigidity.  [facility]     * (Principal) ESBL (extended spectrum beta-lactamase) producing  bacteria infection     Completed fosfomycin treatment. No urinary symptoms.         Essential hypertension     Normotensive -Will continue to monitor        Parkinson disease     At her baseline. - Continue carbodopa-levodopa. Per neurology, trialed off of medication  and patient felt significantly worse.        Paroxysmal atrial fibrillation (HCC)     NSR -Continue metoprolol 12.5 mg BID        Pulmonary embolus (HCC)     Hx PE. Not on anticoagulation given bleeding risk.  - SCDs        Syncope     Likely multifactorial- general progressive decline, Parkinson's  disease, sedentary with orthostasis. Patient has hx of GI AVMs but less  likely considering patient Hg stable.  - PT/OT recommended SNF - As patient has hx of GI AVMS, will continue protonix - Fall and delirium precautions        Goals of care, counseling/discussion     Pending SNF placement.          Debility     Patient with multifactorial debility-Parkinson's, cognitive decline,  age. -  PT/OT continue to follow IP, recommended patient be placed at SNF for  rehab        FEN/GI: Dysphagia 3/soft mechanical diet PPx: SCDs Dispo:SNF  pending authorization . Barriers include placement.  Subjective:  Doing well without complaints this AM  Objective: Temp:  [97.9 F (36.6 C)-99.3 F (37.4 C)] 97.9 F (36.6 C) (07/13 2053) Pulse Rate:  [70-78] 78 (07/13 2053) Resp:  [18] 18 (07/13 2053) BP: (113-178)/(63-82) 113/66 (07/13 2053) SpO2:  [97 %-100 %] 98 % (07/13 2053) General: NAD, pleasant, able to participate in exam Card: RRR m/g/r Respiratory: No respiratory distress Skin: warm and dry, no rashes noted Psych: Normal affect and mood   Laboratory: Most recent CBC Lab Results  Component Value Date   WBC 3.6 (L) 03/28/2023   HGB 11.1 (L) 03/28/2023   HCT 33.0 (L) 03/28/2023   MCV 74.3 (L) 03/28/2023   PLT 235 03/28/2023   Most recent BMP    Latest Ref Rng & Units 03/27/2023    2:30 PM  BMP  Glucose 70 - 99 mg/dL 147   BUN 8 - 23 mg/dL 13   Creatinine 8.29 - 1.00 mg/dL 5.62   Sodium 130 - 865 mmol/L 139   Potassium 3.5 - 5.1 mmol/L 3.9   Chloride 98 - 111 mmol/L 105   CO2  22 - 32 mmol/L 20   Calcium 8.9 - 10.3 mg/dL 8.1      Alfredo Martinez, MD 04/02/2023, 4:46 AM  PGY-3, [facility] FPTS Intern pager: [phone], text pages welcome Secure chat group [facility] Teaching Service

## 2023-04-02 NOTE — Assessment & Plan Note (Signed)
Completed fosfomycin treatment. No urinary symptoms.

## 2023-04-02 NOTE — Assessment & Plan Note (Signed)
At her baseline. - Continue carbodopa-levodopa. Per neurology, trialed off of medication and patient felt significantly worse.

## 2023-04-02 NOTE — Assessment & Plan Note (Signed)
Pending SNF placement

## 2023-04-02 NOTE — Plan of Care (Signed)
  Problem: Education: Goal: Knowledge of General Education information will improve Description: Including pain rating scale, medication(s)/side effects and non-pharmacologic comfort measures Outcome: Progressing   Problem: Health Behavior/Discharge Planning: Goal: Ability to manage health-related needs will improve Outcome: Progressing   Problem: Activity: Goal: Risk for activity intolerance will decrease Outcome: Progressing   Problem: Coping: Goal: Level of anxiety will decrease Outcome: Progressing   Problem: Elimination: Goal: Will not experience complications related to bowel motility Outcome: Progressing Goal: Will not experience complications related to urinary retention Outcome: Progressing   Problem: Safety: Goal: Ability to remain free from injury will improve Outcome: Progressing   Problem: Pain Managment: Goal: General experience of comfort will improve Outcome: Progressing   Problem: Skin Integrity: Goal: Risk for impaired skin integrity will decrease Outcome: Progressing

## 2023-04-02 NOTE — Assessment & Plan Note (Signed)
NSR -Continue metoprolol 12.5 mg BID

## 2023-04-03 DIAGNOSIS — Z86711 Personal history of pulmonary embolism: Secondary | ICD-10-CM | POA: Insufficient documentation

## 2023-04-03 DIAGNOSIS — G20A1 Parkinson's disease without dyskinesia, without mention of fluctuations: Secondary | ICD-10-CM | POA: Diagnosis not present

## 2023-04-03 NOTE — TOC Progression Note (Signed)
Transition of Care [facility]) - Progression Note    Patient Details  Name: MATEA STANARD MRN: [id number] Date of Birth: 23-Dec-1950  Transition of Care [facility]) CM/SW Contact  Melanny Wire A [place], [place] Phone Number: 04/03/2023, 4:29 PM  Clinical Narrative:     CSW contacted pt's daughter and updated bed offer from [facility], that they were able to extend an offer. She asked CSW if a private room was available. CSW informed her that she can reach out to facility and find out if private room is available at the moment.   CSW provided contact information for [facility].   Sarita Haver stated she would follow up with [facility] and let CSW know if she has selected [facility] depending on private room availability.   TOC will continue to follow.   Expected Discharge Plan: Skilled Nursing Facility Barriers to Discharge: Continued Medical Work up  Expected Discharge Plan and Services In-house Referral: Clinical Social Work     Living arrangements for the past 2 months: Single Family Home                                       Social Determinants of Health (SDOH) Interventions SDOH Screenings   Food Insecurity: No Food Insecurity (03/28/2023)  Housing: Patient Declined (03/28/2023)  Transportation Needs: No Transportation Needs (03/28/2023)  Utilities: At Risk (03/28/2023)  Alcohol Screen: Low Risk  (01/05/2023)  Depression (PHQ2-9): Low Risk  (01/05/2023)  Financial Resource Strain: Low Risk  (01/05/2023)  Physical Activity: Inactive (01/05/2023)  Social Connections: Moderately Isolated (01/05/2023)  Stress: No Stress Concern Present (01/05/2023)  Tobacco Use: Medium Risk (03/28/2023)    Readmission Risk Interventions    01/17/2023    1:37 PM  Readmission Risk Prevention Plan  Transportation Screening Complete  PCP or Specialist Appt within 5-7 Days Complete  Home Care Screening Complete  Medication Review (RN CM) Referral to Pharmacy

## 2023-04-03 NOTE — Plan of Care (Signed)
  Problem: Education: Goal: Knowledge of General Education information will improve Description: Including pain rating scale, medication(s)/side effects and non-pharmacologic comfort measures Outcome: Progressing   Problem: Health Behavior/Discharge Planning: Goal: Ability to manage health-related needs will improve Outcome: Progressing   Problem: Clinical Measurements: Goal: Ability to maintain clinical measurements within normal limits will improve Outcome: Progressing Goal: Will remain free from infection Outcome: Progressing Goal: Diagnostic test results will improve Outcome: Progressing Goal: Respiratory complications will improve Outcome: Progressing Goal: Cardiovascular complication will be avoided Outcome: Progressing   Problem: Elimination: Goal: Will not experience complications related to bowel motility Outcome: Progressing Goal: Will not experience complications related to urinary retention Outcome: Progressing   Problem: Coping: Goal: Level of anxiety will decrease Outcome: Progressing   Problem: Nutrition: Goal: Adequate nutrition will be maintained Outcome: Progressing   Problem: Pain Managment: Goal: General experience of comfort will improve Outcome: Progressing

## 2023-04-03 NOTE — Assessment & Plan Note (Signed)
 Hx PE. Not on anticoagulation given bleeding risk.  - SCDs

## 2023-04-03 NOTE — Progress Notes (Signed)
Speech Language Pathology Treatment: Dysphagia  Patient Details Name: Anna Bernard MRN: [id number] DOB: 1951-06-19 Today's Date: 04/03/2023 Time: 5956-3875 SLP Time Calculation (min) (ACUTE ONLY): 9 min  Assessment / Plan / Recommendation Clinical Impression  Pt seen for dysphagia following MBS 7/11. Breakfast tray in front of pt however she was ill positioned. SLP repositioned and observed with Dys 3 texture, thin liquids via straw. Pt had pharyngeal residue during MBS and today she appeared to sense and performed a second swallow independently. There were no signs of aspiration with consistencies and pt states she would like to continue having her meats chopped on the Dys 3 texture. Pt appears safe with current diet and ST will sign off at this time.    HPI HPI: Anna Bernard is a 72 y.o. female presenting with syncopal episode yesterday after having a BM. In the ED she was also found to have UTI with gross hematuria and dark, melanotic stool. Hemoglobin was stable and patient has hx of AVM. Differential for presentation of this includes Chronic GI bleed, Vasovagal, Dehydration, less likely Malignancy. Patient is very motivated to be out of hospital and would not like further work up regarding her presentation. Pt with hx Parkinson's Disease, CAD, HTN, Hyperlipidemia, Hypothyroidism, Paroxysmal Atrial Fibrillation, PE, Prediabetes, Anemia.      SLP Plan  All goals met;Discharge SLP treatment due to (comment)      Recommendations for follow up therapy are one component of a multi-disciplinary discharge planning process, led by the attending physician.  Recommendations may be updated based on patient status, additional functional criteria and insurance authorization.    Recommendations  Diet recommendations: Dysphagia 3 (mechanical soft);Thin liquid Liquids provided via: Straw;Cup Medication Administration: Whole meds with liquid Supervision: Patient able to self feed Compensations: Slow  rate;Small sips/bites;Multiple dry swallows after each bite/sip Postural Changes and/or Swallow Maneuvers: Seated upright 90 degrees                  Oral care BID   None Dysphagia, oropharyngeal phase (R13.12)     All goals met;Discharge SLP treatment due to (comment)     Royce Macadamia  04/03/2023, 9:42 AM

## 2023-04-03 NOTE — Assessment & Plan Note (Signed)
Patient has been slightly hypertensive over the past day.  - Consider increasing lopressor to 25 BID

## 2023-04-03 NOTE — Plan of Care (Signed)
  Problem: Education: Goal: Knowledge of General Education information will improve Description: Including pain rating scale, medication(s)/side effects and non-pharmacologic comfort measures 04/03/2023 1141 by Redgie Grayer, RN Outcome: Progressing 04/03/2023 1140 by Redgie Grayer, RN Outcome: Progressing   Problem: Health Behavior/Discharge Planning: Goal: Ability to manage health-related needs will improve 04/03/2023 1141 by Redgie Grayer, RN Outcome: Progressing 04/03/2023 1140 by Redgie Grayer, RN Outcome: Progressing   Problem: Clinical Measurements: Goal: Ability to maintain clinical measurements within normal limits will improve 04/03/2023 1141 by Redgie Grayer, RN Outcome: Progressing 04/03/2023 1140 by Redgie Grayer, RN Outcome: Progressing Goal: Will remain free from infection 04/03/2023 1141 by Redgie Grayer, RN Outcome: Progressing 04/03/2023 1140 by Redgie Grayer, RN Outcome: Progressing Goal: Diagnostic test results will improve 04/03/2023 1141 by Redgie Grayer, RN Outcome: Progressing 04/03/2023 1140 by Redgie Grayer, RN Outcome: Progressing Goal: Respiratory complications will improve 04/03/2023 1141 by Redgie Grayer, RN Outcome: Progressing 04/03/2023 1140 by Redgie Grayer, RN Outcome: Progressing Goal: Cardiovascular complication will be avoided 04/03/2023 1141 by Redgie Grayer, RN Outcome: Progressing 04/03/2023 1140 by Redgie Grayer, RN Outcome: Progressing   Problem: Activity: Goal: Risk for activity intolerance will decrease 04/03/2023 1141 by Redgie Grayer, RN Outcome: Progressing 04/03/2023 1140 by Redgie Grayer, RN Outcome: Progressing   Problem: Nutrition: Goal: Adequate nutrition will be maintained 04/03/2023 1141 by Redgie Grayer, RN Outcome: Progressing 04/03/2023 1140 by Redgie Grayer, RN Outcome: Progressing   Problem: Coping: Goal: Level of anxiety will decrease 04/03/2023 1141 by Redgie Grayer, RN Outcome: Progressing 04/03/2023 1140 by  Redgie Grayer, RN Outcome: Progressing   Problem: Elimination: Goal: Will not experience complications related to bowel motility 04/03/2023 1141 by Redgie Grayer, RN Outcome: Progressing 04/03/2023 1140 by Redgie Grayer, RN Outcome: Progressing Goal: Will not experience complications related to urinary retention 04/03/2023 1141 by Redgie Grayer, RN Outcome: Progressing 04/03/2023 1140 by Redgie Grayer, RN Outcome: Progressing   Problem: Pain Managment: Goal: General experience of comfort will improve 04/03/2023 1141 by Redgie Grayer, RN Outcome: Progressing 04/03/2023 1140 by Redgie Grayer, RN Outcome: Progressing   Problem: Safety: Goal: Ability to remain free from injury will improve 04/03/2023 1141 by Redgie Grayer, RN Outcome: Progressing 04/03/2023 1140 by Redgie Grayer, RN Outcome: Progressing   Problem: Skin Integrity: Goal: Risk for impaired skin integrity will decrease 04/03/2023 1141 by Redgie Grayer, RN Outcome: Progressing 04/03/2023 1140 by Redgie Grayer, RN Outcome: Progressing

## 2023-04-03 NOTE — Assessment & Plan Note (Signed)
Patient with multifactorial debility-Parkinson's, cognitive decline, age. - PT/OT continue to follow IP, recommended patient be placed at [facility] for rehab

## 2023-04-03 NOTE — Progress Notes (Addendum)
Daily Progress Note Intern Pager: [phone]  Patient name: Anna Bernard Medical record number: [id number] Date of birth: [birth year] Age: 72 y.o. Gender: female  Primary Care Provider: Billey Co, MD Consultants: Palliative Code Status: Full  Pt Overview and Major Events to Date:  7/8- patient admitted, fosfomycin 3 g x 1 7/9- + FOB, discussion with palliative regarding GOC 7/10- patient and family discussed goals of care and both agree to SNF placement 7/14- awaiting SNF   Assessment and Plan: Anna Bernard is a 72 yo F presenting with syncrope episode 7/8 after a BM. In the ED, she was also found to have UTI with gross hematuria (since treatment with antibiotics) and dark stool questionable for melena. Her Hbg has remained stable. Will be going to SNF for balance concern and parkinsonian rigidity.   [facility]     * (Principal) ESBL (extended spectrum beta-lactamase) producing  bacteria infection     Completed fosfomycin treatment. No urinary symptoms.         Essential hypertension     Patient has been slightly hypertensive over the past 70 day.  - Consider increasing lopressor to 25 BID        Parkinson disease     At her baseline. - Continue carbodopa-levodopa. Per neurology, trialed off of medication  and patient felt significantly worse.        Paroxysmal atrial fibrillation (HCC)     NSR -Continue metoprolol 12.5 mg BID        RESOLVED: Pulmonary embolus (HCC)     Hx PE. Not on anticoagulation given bleeding risk.  - SCDs        Syncope     Likely multifactorial- general progressive decline, Parkinson's  disease, sedentary with orthostasis. Patient has hx of GI AVMs but less  likely considering patient Hg stable.  - PT/OT recommended SNF - As patient has hx of GI AVMS, will continue protonix - Fall and delirium precautions        Goals of care, counseling/discussion     Pending SNF placement.          Debility     Patient with  multifactorial debility-Parkinson's, cognitive decline,  age. - PT/OT continue to follow IP, recommended patient be placed at SNF for  rehab        Hx of pulmonary embolus     Hx PE 05/23. Was not on anticoagulation given bleeding risk and per  chart review, patient has hx of syncopal episodes.  - SCDs for now       FEN/GI: Dysphagia 3/soft mechanical diet PPx: SCDs Dispo: SNF pending clinical improvement. Barriers include placement.   Subjective:  Patient reports she is ready to go home.  Patient is asking for someone to come help her eat.  Patient is also asking that I call her daughter to come see her.  Objective: Temp:  [97.7 F (36.5 C)-98.7 F (37.1 C)] 98.7 F (37.1 C) (07/15 0818) Pulse Rate:  [74-84] 84 (07/15 0818) Resp:  [18] 18 (07/15 0818) BP: (108-177)/(62-92) 172/92 (07/15 0818) SpO2:  [98 %-100 %] 100 % (07/15 0818) Physical Exam: General: Answers a few questions, knows name, thinks she is going home today  Cardiovascular: RRR no M/G/R Respiratory: No work of breathing room air Abdomen: Soft, normal bowel sounds Extremities: Resting tremor, slow to move extremities  Laboratory: Most recent CBC Lab Results  Component Value Date   WBC 3.6 (L) 03/28/2023   HGB  11.1 (L) 03/28/2023   HCT 33.0 (L) 03/28/2023   MCV 74.3 (L) 03/28/2023   PLT 235 03/28/2023   Most recent BMP    Latest Ref Rng & Units 03/27/2023    2:30 PM  BMP  Glucose 70 - 99 mg/dL 161   BUN 8 - 23 mg/dL 13   Creatinine 0.96 - 1.00 mg/dL 0.45   Sodium 409 - 811 mmol/L 139   Potassium 3.5 - 5.1 mmol/L 3.9   Chloride 98 - 111 mmol/L 105   CO2 22 - 32 mmol/L 20   Calcium 8.9 - 10.3 mg/dL 8.1    Imaging/Diagnostic Tests: None [place]an Samford Georg Ruddle, MD 04/03/2023, 12:55 PM  PGY-1, [facility] FPTS Intern pager: [phone], text pages welcome Secure chat group [facility] Teaching Service

## 2023-04-03 NOTE — Assessment & Plan Note (Signed)
Hx PE 05/23. Was not on anticoagulation given bleeding risk and per chart review, patient has hx of syncopal episodes.  - SCDs for now

## 2023-04-04 ENCOUNTER — Telehealth: Payer: Self-pay | Admitting: Family Medicine

## 2023-04-04 DIAGNOSIS — R5381 Other malaise: Secondary | ICD-10-CM

## 2023-04-04 DIAGNOSIS — Z1612 Extended spectrum beta lactamase (ESBL) resistance: Secondary | ICD-10-CM

## 2023-04-04 DIAGNOSIS — D509 Iron deficiency anemia, unspecified: Secondary | ICD-10-CM | POA: Insufficient documentation

## 2023-04-04 DIAGNOSIS — A499 Bacterial infection, unspecified: Secondary | ICD-10-CM | POA: Diagnosis not present

## 2023-04-04 LAB — BASIC METABOLIC PANEL
Anion gap: 10 (ref 5–15)
BUN: 17 mg/dL (ref 8–23)
CO2: 28 mmol/L (ref 22–32)
Calcium: 8.9 mg/dL (ref 8.9–10.3)
Chloride: 99 mmol/L (ref 98–111)
Creatinine, Ser: 0.72 mg/dL (ref 0.44–1.00)
GFR, Estimated: >60 mL/min (ref >60)
Glucose, Bld: 86 mg/dL (ref 70–99)
Potassium: 4 mmol/L (ref 3.5–5.1)
Sodium: 137 mmol/L (ref 135–145)

## 2023-04-04 LAB — CBC
HCT: 31.9 % — ABNORMAL LOW (ref 36.0–46.0)
Hemoglobin: 10.4 g/dL — ABNORMAL LOW (ref 12.0–15.0)
MCH: 24.3 pg — ABNORMAL LOW (ref 26.0–34.0)
MCHC: 32.6 g/dL (ref 30.0–36.0)
MCV: 74.5 fL — ABNORMAL LOW (ref 80.0–100.0)
Platelets: 327 10*3/uL (ref 150–400)
RBC: 4.28 MIL/uL (ref 3.87–5.11)
RDW: 16.5 % — ABNORMAL HIGH (ref 11.5–15.5)
WBC: 5.7 10*3/uL (ref 4.0–10.5)
nRBC: 0 % (ref 0.0–0.2)

## 2023-04-04 MED ORDER — IRON SUCROSE 500 MG IVPB - SIMPLE MED
500.0000 mg | Freq: Once | INTRAVENOUS | Status: DC
  Filled 2023-04-04 (×2): qty 275

## 2023-04-04 MED ORDER — SODIUM CHLORIDE 0.9 % IV SOLN
500.0000 mg | Freq: Once | INTRAVENOUS | Status: AC
  Administered 2023-04-04: 500 mg via INTRAVENOUS
  Filled 2023-04-04: qty 25

## 2023-04-04 NOTE — Progress Notes (Signed)
Daily Progress Note Intern Pager: [phone]  Patient name: Anna Bernard Medical record number: [id number] Date of birth: Jan 19, 1951 Age: 72 y.o. Gender: female  Primary Care Provider: Billey Co, MD Consultants: Palliative Code Status: Full  Pt Overview and Major Events to Date:  7/8- patient admitted, fosfomycin 3 g x 1 7/9- + FOB, discussion with palliative regarding GOC 7/10- patient and family discussed goals of care and both agree to SNF placement 7/14 - 7/16- awaiting SNF   Assessment and Plan: Anna Bernard is a 72 yo F presenting with syncrope episode 7/8 after a BM. In the ED, she was also found to have UTI with gross hematuria (since treatment with antibiotics) and dark stool questionable for melena. Her Hbg has remained stable. Will be going to SNF for balance concern and parkinsonian rigidity.   [facility]     * (Principal) ESBL (extended spectrum beta-lactamase) producing  bacteria infection     Completed fosfomycin treatment. No urinary symptoms.         Essential hypertension     Patients BP normalizing - continue on lopressor 12.5 mg         Parkinson disease     At her baseline. - Continue carbodopa-levodopa. Per neurology, trialed off of medication  and patient felt significantly worse.        Paroxysmal atrial fibrillation (HCC)     NSR -Continue metoprolol 12.5 mg BID        Syncope     Likely multifactorial- general progressive decline, Parkinson's  disease, sedentary with orthostasis. Patient has hx of GI AVMs but less  likely considering patient Hg stable.  - PT/OT recommended SNF - As patient has hx of GI AVMS, will continue protonix - Fall and delirium precautions        Debility     Patient with multifactorial debility-Parkinson's, cognitive decline,  age. - PT/OT continue to follow IP, recommended patient be placed at SNF for  rehab        Hx of pulmonary embolus     Hx PE 05/23. Was not on anticoagulation given  bleeding risk and per  chart review, patient has hx of syncopal episodes.  - SCDs for now        Iron deficiency anemia    FEN/GI: Dysphagia 3/soft mechanical diet  PPx: SCDs  Dispo:SNF  pending placement .   Subjective:  Patient sleeping at bedside this morning.  Patient reports she is hungry and would like someone to come help her eat.  Patient reports that she would like to go home.  No acute complaints at this time  Objective: Temp:  [97.7 F (36.5 C)-98.3 F (36.8 C)] 98.3 F (36.8 C) (07/16 0738) Pulse Rate:  [71-85] 79 (07/16 0738) Resp:  [18-20] 18 (07/16 0524) BP: (112-134)/(60-72) 113/60 (07/16 0738) SpO2:  [99 %-100 %] 99 % (07/16 0738) Weight:  [71.2 kg] 71.2 kg (07/16 0524) Physical Exam: General: Sleeping, slow to arouse Cardiovascular: RRR no M/G/R Respiratory: Normal work of breathing on room air Abdomen: Soft, normal bowel sounds Extremities: Sitting tremor, slow to move extremities  Laboratory: Most recent CBC Lab Results  Component Value Date   WBC 5.7 04/04/2023   HGB 10.4 (L) 04/04/2023   HCT 31.9 (L) 04/04/2023   MCV 74.5 (L) 04/04/2023   PLT 327 04/04/2023   Most recent BMP    Latest Ref Rng & Units 04/04/2023    2:39 AM  BMP  Glucose 70 - 99 mg/dL 86   BUN 8 - 23 mg/dL 17   Creatinine 1.61 - 1.00 mg/dL 0.96   Sodium 045 - 409 mmol/L 137   Potassium 3.5 - 5.1 mmol/L 4.0   Chloride 98 - 111 mmol/L 99   CO2 22 - 32 mmol/L 28   Calcium 8.9 - 10.3 mg/dL 8.9       Imaging/Diagnostic Tests: None Tashawnda Bleiler Georg Ruddle, MD 04/04/2023, 2:30 PM  PGY-1, [facility] FPTS Intern pager: [phone], text pages welcome Secure chat group [facility] Teaching Service

## 2023-04-04 NOTE — Assessment & Plan Note (Signed)
Patient has history of IDA with Hg stable for now - IV Venofer today

## 2023-04-04 NOTE — Plan of Care (Signed)

## 2023-04-04 NOTE — Assessment & Plan Note (Signed)
Hx PE 05/23. Was not on anticoagulation given bleeding risk and per chart review, patient has hx of syncopal episodes.  - SCDs for now

## 2023-04-04 NOTE — Telephone Encounter (Signed)
Patient's husband walked in requesting a Rx for a Hoyer lift to be brought to the home . Name of the company is [employer] [phone]. Fax # is [phone]   Her last OV was 01/23/23

## 2023-04-04 NOTE — Assessment & Plan Note (Signed)
Patient with multifactorial debility-Parkinson's, cognitive decline, age. - PT/OT continue to follow IP, recommended patient be placed at [facility] for rehab

## 2023-04-04 NOTE — Assessment & Plan Note (Signed)
Patients BP normalizing - continue on lopressor 12.5 mg

## 2023-04-04 NOTE — Assessment & Plan Note (Signed)
 Likely multifactorial- general progressive decline, Parkinson's disease, sedentary with orthostasis. Patient has hx of GI AVMs but less likely considering patient Hg stable.  - PT/OT recommended SNF - As patient has hx of GI AVMS, will continue protonix - Fall and delirium precautions

## 2023-04-04 NOTE — Progress Notes (Signed)
Physical Therapy Treatment Patient Details Name: Anna Bernard MRN: [id number] DOB: 02-May-1951 Today's Date: 04/04/2023   History of Present Illness 72 y.o. female presents to [facility] hospital on 03/27/2023 after syncopal episode. Pt found to have UTI. PMH includes recurrent syncopal episodes, Parkinson disease, PAF, PE, CAD, HTN, prediabetes.    PT Comments  Pt greeted resting in bed, awake and alert throughout session with steady progress towards acute goals. Pt continues to be limited by weakness, difficulty sequencing and initiating mobility and poor balance/postural reactions. Pt needing grossly mod-max A for bed mobility and intermittent min A to maintain static sitting balance EOB. Pt able to come to partial stand x4 with max A to clear hips, full upright stand limited by hip and knee flexion despite multimodal cues and blocking at bil knees. Pt spouse present at bedside and encouraging throughout session. Current plan remains appropriate to address deficits and maximize functional independence and decrease caregiver burden. Pt continues to benefit from skilled PT services to progress toward functional mobility goals.       Assistance Recommended at Discharge Frequent or constant Supervision/Assistance  If plan is discharge home, recommend the following:  Can travel by private vehicle    Two people to help with walking and/or transfers;A lot of help with bathing/dressing/bathroom;Assistance with cooking/housework;Direct supervision/assist for medications management;Direct supervision/assist for financial management;Assist for transportation;Help with stairs or ramp for entrance   No  Equipment Recommendations  Hospital bed (hoyer lift)    Recommendations for Other Services       Precautions / Restrictions Precautions Precautions: Fall Precaution Comments: Parkinson disease with history of syncope Restrictions Weight Bearing Restrictions: No     Mobility  Bed Mobility Overal bed  mobility: Needs Assistance Bed Mobility: Supine to Sit, Sit to Supine     Supine to sit: Mod assist Sit to supine: Max assist   General bed mobility comments: Mod A with significant multimodal cueing and assist for truncal elevation and light guidance for BLE to EOB. Max A for all aspects to return to supine    Transfers Overall transfer level: Needs assistance Equipment used: 1 person hand held assist Transfers: Sit to/from Stand Sit to Stand: Max assist           General transfer comment: max A to elevate hips from EOB x4, bil knees blocked, pt unable to achieve full upright standing dues to knee and hip flexion    Ambulation/Gait                   Stairs             Wheelchair Mobility     Tilt Bed    Modified Rankin (Stroke Patients Only)       Balance Overall balance assessment: Needs assistance Sitting-balance support: Single extremity supported, Bilateral upper extremity supported, Feet unsupported Sitting balance-Leahy Scale: Poor Sitting balance - Comments: Min A withwith R lateral lean EOB Postural control: Right lateral lean   Standing balance-Leahy Scale: Poor Standing balance comment: maxA, tendency for posterior lean                            Cognition Arousal/Alertness: Awake/alert Behavior During Therapy: Flat affect Overall Cognitive Status: Impaired/Different from baseline Area of Impairment: Orientation, Attention, Memory, Following commands, Safety/judgement, Awareness, Problem solving                 Orientation Level: Disoriented to, Time, Situation Current Attention Level: Sustained  Memory: Decreased short-term memory Following Commands: Follows one step commands with increased time Safety/Judgement: Decreased awareness of safety, Decreased awareness of deficits Awareness: Intellectual Problem Solving: Slow processing, Decreased initiation, Difficulty sequencing General Comments: pt more awake and  alert throughout session, increased time to follow all commands and to initiate mobility        Exercises Other Exercises Other Exercises: reaching with trunk rotation R/L x10 Other Exercises: L propped sitting on L elbow to encourage midline sitting    General Comments General comments (skin integrity, edema, etc.): pt spouse present and encouraging      Pertinent Vitals/Pain Pain Assessment Pain Assessment: Faces Faces Pain Scale: No hurt    Home Living                          Prior Function            PT Goals (current goals can now be found in the care plan section) Acute Rehab PT Goals Patient Stated Goal: to improve functional mobility quality, reduce caregiver burden PT Goal Formulation: With patient Time For Goal Achievement: 04/11/23 Progress towards PT goals: Progressing toward goals    Frequency    Min 3X/week      PT Plan Current plan remains appropriate    Co-evaluation              AM-PAC PT "6 Clicks" Mobility   Outcome Measure  Help needed turning from your back to your side while in a flat bed without using bedrails?: A Lot Help needed moving from lying on your back to sitting on the side of a flat bed without using bedrails?: A Lot Help needed moving to and from a bed to a chair (including a wheelchair)?: A Lot Help needed standing up from a chair using your arms (e.g., wheelchair or bedside chair)?: A Lot Help needed to walk in hospital room?: Total Help needed climbing 3-5 steps with a railing? : Total 6 Click Score: 10    End of Session Equipment Utilized During Treatment: Gait belt Activity Tolerance: Patient tolerated treatment well;Patient limited by fatigue Patient left: in bed;with call bell/phone within reach;with family/visitor present Nurse Communication: Mobility status PT Visit Diagnosis: Other abnormalities of gait and mobility (R26.89);Muscle weakness (generalized) (M62.81);Other symptoms and signs involving  the nervous system (R29.898)     Time: 1610-9604 PT Time Calculation (min) (ACUTE ONLY): 25 min  Charges:    $Therapeutic Activity: 23-37 mins PT General Charges $$ ACUTE PT VISIT: 1 Visit                     Miyeko Mahlum R. PTA Acute Rehabilitation Services Office: [phone]   Catalina Antigua 04/04/2023, 11:41 AM

## 2023-04-05 DIAGNOSIS — R5381 Other malaise: Secondary | ICD-10-CM | POA: Diagnosis not present

## 2023-04-05 NOTE — Plan of Care (Signed)

## 2023-04-05 NOTE — Progress Notes (Signed)
Occupational Therapy Treatment Patient Details Name: Anna Bernard MRN: [id number] DOB: Aug 05, 1951 Today's Date: 04/05/2023   History of present illness 72 y.o. female presents to [facility] hospital on 03/27/2023 after syncopal episode. Pt found to have UTI. PMH includes recurrent syncopal episodes, Parkinson disease, PAF, PE, CAD, HTN, prediabetes.   OT comments  Pt demonstrating fair progress toward therapy goals. Pt continues to present with generalized weakness, decreased strength, pain and cognition requiring mod multimodal cues throughout to complete 1 step commands. Pt agreeable to OT tx session with session focused on bathing tasks at EOB. Pt educated on use of big movements during functional tasks to maximize independence, pt demonstrated understanding. Pt completed bed mobility and STS transfer from EOB with +2 assist. Pt would benefit from continued acute OT services to maximize functional independence and facilitate transition to skilled inpatient follow up therapy, <3 hours/day.    Recommendations for follow up therapy are one component of a multi-disciplinary discharge planning process, led by the attending physician.  Recommendations may be updated based on patient status, additional functional criteria and insurance authorization.    Assistance Recommended at Discharge Frequent or constant Supervision/Assistance  Patient can return home with the following  A lot of help with walking and/or transfers;A lot of help with bathing/dressing/bathroom;Direct supervision/assist for medications management;Assistance with cooking/housework;Direct supervision/assist for financial management;Assist for transportation;Help with stairs or ramp for entrance   Equipment Recommendations  None recommended by OT    Recommendations for Other Services      Precautions / Restrictions Precautions Precautions: Fall Precaution Comments: Parkinson disease with history of syncope Restrictions Weight Bearing  Restrictions: No       Mobility Bed Mobility Overal bed mobility: Needs Assistance Bed Mobility: Rolling, Sidelying to Sit, Sit to Supine Rolling: Max assist Sidelying to sit: Max assist, +2 for safety/equipment, +2 for physical assistance, HOB elevated   Sit to supine: Max assist, HOB elevated, +2 for safety/equipment, +2 for physical assistance   General bed mobility comments: assist at trunk and BLEs    Transfers Overall transfer level: Needs assistance Equipment used: 2 person hand held assist Transfers: Sit to/from Stand Sit to Stand: Max assist, +2 physical assistance, +2 safety/equipment           General transfer comment: STS transfer from EOB with max A +2 and BUE support on chair for stability     Balance Overall balance assessment: Needs assistance Sitting-balance support: Single extremity supported, Feet supported Sitting balance-Leahy Scale: Poor Sitting balance - Comments: EOB   Standing balance support: Bilateral upper extremity supported, During functional activity Standing balance-Leahy Scale: Poor Standing balance comment: BUE support in standing                           ADL either performed or assessed with clinical judgement   ADL Overall ADL's : Needs assistance/impaired     Grooming: Min guard;Wash/dry hands;Wash/dry face;Sitting Grooming Details (indicate cue type and reason): EOB Upper Body Bathing: Min guard;Sitting Upper Body Bathing Details (indicate cue type and reason): washing BUEs at EOB Lower Body Bathing: Sit to/from stand;Moderate assistance;+2 for physical assistance           Toilet Transfer: Maximal assistance;+2 for safety/equipment;+2 for physical assistance;BSC/3in1;Stand-pivot Toilet Transfer Details (indicate cue type and reason): simulated Toileting- Clothing Manipulation and Hygiene: Sit to/from stand;+2 for physical assistance;Total assistance Toileting - Clothing Manipulation Details (indicate cue type  and reason): for posterior hygiene in standing  Functional mobility during ADLs: Maximal assistance;+2 for physical assistance;+2 for safety/equipment;Cueing for safety;Cueing for sequencing General ADL Comments: requires mod multimodal cues for sequencing, limited secondary to weakness, decreased balance and pain    Extremity/Trunk Assessment Upper Extremity Assessment Upper Extremity Assessment: Generalized weakness   Lower Extremity Assessment Lower Extremity Assessment: Defer to PT evaluation        Vision   Vision Assessment?: No apparent visual deficits   Perception Perception Perception: Not tested   Praxis Praxis Praxis: Not tested    Cognition Arousal/Alertness: Awake/alert Behavior During Therapy: Flat affect Overall Cognitive Status: Impaired/Different from baseline Area of Impairment: Attention, Memory, Following commands, Safety/judgement, Awareness, Problem solving                   Current Attention Level: Sustained Memory: Decreased short-term memory Following Commands: Follows one step commands with increased time Safety/Judgement: Decreased awareness of safety, Decreased awareness of deficits Awareness: Intellectual Problem Solving: Slow processing, Decreased initiation, Difficulty sequencing General Comments: Pt following commands with increased time and mod multimodal cues        Exercises      Shoulder Instructions       General Comments VSS on RA    Pertinent Vitals/ Pain       Pain Assessment Pain Assessment: Faces Faces Pain Scale: Hurts little more Pain Location: right hip Pain Descriptors / Indicators: Discomfort, Grimacing, Guarding Pain Intervention(s): Limited activity within patient's tolerance, Monitored during session  Home Living                                          Prior Functioning/Environment              Frequency  Min 1X/week        Progress Toward Goals  OT Goals(current  goals can now be found in the care plan section)  Progress towards OT goals: Progressing toward goals  Acute Rehab OT Goals Patient Stated Goal: to wash up OT Goal Formulation: With patient/family Time For Goal Achievement: 04/11/23 Potential to Achieve Goals: Good ADL Goals Pt Will Perform Grooming: with min assist;standing Pt Will Perform Lower Body Bathing: sit to/from stand;with min assist Pt Will Perform Lower Body Dressing: with mod assist;sit to/from stand Pt Will Transfer to Toilet: bedside commode;stand pivot transfer;with min assist Pt Will Perform Toileting - Clothing Manipulation and hygiene: with min assist;sit to/from stand Additional ADL Goal #1: Pt will transfer from supine to sit EOB with min assist in preparation for selfcare tasks.  Plan Discharge plan remains appropriate;Frequency remains appropriate    Co-evaluation                 AM-PAC OT "6 Clicks" Daily Activity     Outcome Measure   Help from another person eating meals?: A Little Help from another person taking care of personal grooming?: A Little Help from another person toileting, which includes using toliet, bedpan, or urinal?: A Lot Help from another person bathing (including washing, rinsing, drying)?: A Lot Help from another person to put on and taking off regular upper body clothing?: A Lot Help from another person to put on and taking off regular lower body clothing?: A Lot 6 Click Score: 14    End of Session Equipment Utilized During Treatment: Gait belt  OT Visit Diagnosis: Unsteadiness on feet (R26.81);Other abnormalities of gait and mobility (R26.89);Repeated falls (R29.6);Muscle weakness (generalized) (  M62.81);Other symptoms and signs involving cognitive function   Activity Tolerance Patient tolerated treatment well   Patient Left in bed;with call bell/phone within reach;with bed alarm set;with family/visitor present   Nurse Communication Mobility status        Time:  2440-1027 OT Time Calculation (min): 18 min  Charges: OT General Charges $OT Visit: 1 Visit OT Treatments $Self Care/Home Management : 8-22 mins  Sherley Bounds, OTS Acute Rehabilitation Services Office [phone] Secure Chat Communication Preferred   Sherley Bounds 04/05/2023, 5:11 PM

## 2023-04-05 NOTE — Assessment & Plan Note (Signed)
Patient has history of IDA with Hg stable for now. Transfused iron yesterday.  - continue to monitor Hg

## 2023-04-05 NOTE — Progress Notes (Signed)
Daily Progress Note Intern Pager: [phone]  Patient name: Anna Bernard Medical record number: [id number] Date of birth: 01-Dec-1950 Age: 72 y.o. Gender: female  Primary Care Provider: Billey Co, MD Consultants: None Code Status: Full  Pt Overview and Major Events to Date:  7/8- patient admitted, fosfomycin 3 g x 1 7/9- + FOB, discussion with palliative regarding GOC 7/10- patient and family discussed goals of care and both agree to SNF placement 7/14 - awaiting SNF   Assessment and Plan: Anna Bernard is a 72 yo F who presented after a syncopal episode on 7/8 and was found to have a UTI with gross hematuria. She is currently s/p antibiotics and is stable. She will be going to SNF for balance concern and parkinsonian rigidity.   [facility]     * (Principal) ESBL (extended spectrum beta-lactamase) producing  bacteria infection     Completed fosfomycin treatment. No urinary symptoms.         Essential hypertension     Patients BP normalizing - continue on lopressor 12.5 mg         Parkinson disease     At her baseline. - Continue carbodopa-levodopa. Per neurology, trialed off of medication  and patient felt significantly worse.        Paroxysmal atrial fibrillation (HCC)     NSR -Continue metoprolol 12.5 mg BID        Syncope     Likely multifactorial- general progressive decline, Parkinson's  disease, sedentary with orthostasis. Patient has hx of GI AVMs but less  likely considering patient Hg stable.  - PT/OT recommended SNF - As patient has hx of GI AVMS, will continue protonix - Fall and delirium precautions        Debility     Patient with multifactorial debility-Parkinson's, cognitive decline,  age. - PT/OT continue to follow IP, recommended patient be placed at SNF for  rehab        Hx of pulmonary embolus     Hx PE 05/23. Was not on anticoagulation given bleeding risk and per  chart review, patient has hx of syncopal episodes.  -  SCDs for now        Iron deficiency anemia     Patient has history of IDA with Hg stable for now. Transfused iron  yesterday.  - continue to monitor Hg       FEN/GI: Dysphagia 3/soft mechanical diet  PPx: SCDs  Dispo:SNF  pending placement .   Subjective:  Patient seen at bedside. Sleeping, arousable to voice.   Objective: Temp:  [97.1 F (36.2 C)-98.3 F (36.8 C)] 97.7 F (36.5 C) (07/17 0741) Pulse Rate:  [72-76] 75 (07/17 0741) Resp:  [17-18] 17 (07/17 0741) BP: (106-133)/(62-73) 133/62 (07/17 0741) SpO2:  [94 %-99 %] 94 % (07/17 0741) Weight:  [71.1 kg] 71.1 kg (07/17 3016) Physical Exam: General: awake and asking for breakfast Cardiovascular: RRR Respiratory: NWOB on RA Abdomen: soft, normal bowel sounds, NDNT Extremities: resting tremor with parkinsonian rigidity   Laboratory: Most recent CBC Lab Results  Component Value Date   WBC 5.7 04/04/2023   HGB 10.4 (L) 04/04/2023   HCT 31.9 (L) 04/04/2023   MCV 74.5 (L) 04/04/2023   PLT 327 04/04/2023   Most recent BMP    Latest Ref Rng & Units 04/04/2023    2:39 AM  BMP  Glucose 70 - 99 mg/dL 86   BUN 8 - 23 mg/dL 17  Creatinine 0.44 - 1.00 mg/dL 1.61   Sodium 096 - 045 mmol/L 137   Potassium 3.5 - 5.1 mmol/L 4.0   Chloride 98 - 111 mmol/L 99   CO2 22 - 32 mmol/L 28   Calcium 8.9 - 10.3 mg/dL 8.9    Imaging/Diagnostic Tests: No new imaging  Cyd Silence, MD 04/05/2023, 11:00 AM  PGY-1, [facility] FPTS Intern pager: [phone], text pages welcome Secure chat group [facility] Teaching Service

## 2023-04-06 NOTE — Consult Note (Signed)
   [facility] Inpatient Consult   04/06/2023  ROMELIA BROMELL 1951-01-13 [id number]  [facility] [[facility]]  Accountable Care Organization [ACO] Patient: [employer] Medicare  Primary Care Provider:  Billey Co, MD with [facility] which is listed to provide the transition of care follow up.   Patient was reviewed for readmission score low risk score however, has a 10 day length of stay noted and reviewed for barriers to post hospital community care needs.  Patient was screened for hospitalization and on behalf of [facility] Care Coordination to assess for post hospital community care needs.  Patient is being considered for a skilled nursing facility level of care for post hospital transition, this will be pending insurance authorization noted in review of inpatient [facility] team notes.  If the patient goes to a [facility] affiliated facility then, patient can be followed by [facility] RN with traditional Medicare and approved Medicare Advantage plans.   Plan:   Will notify the Community [facility] RN can follow for any known or needs for transitional care needs for returning to post facility care coordination needs to return to community, if goes to a [facility] affiliated facility.  For questions or referrals, please contact:   Charlesetta Shanks, RN BSN CCM [facility]  [phone] business mobile phone Toll free office [phone]  *Concierge Line  [phone] Fax number: [phone] [email or web address]

## 2023-04-06 NOTE — TOC Progression Note (Signed)
Transition of Care [facility]) - Progression Note    Patient Details  Name: Anna Bernard MRN: [id number] Date of Birth: 08-Oct-1950  Transition of Care [facility]) CM/SW Contact  Dsean Vantol A [place], [place] Phone Number: 04/06/2023, 10:55 AM  Clinical Narrative:     CSW was contacted by [facility] They requested Peer to Peer for pt for authorization. Contact [phone], option 5. Deadline to complete at 3pm. CSW notified pt's current provider.   TOC will continue to follow.   Expected Discharge Plan: Skilled Nursing Facility Barriers to Discharge: Continued Medical Work up  Expected Discharge Plan and Services In-house Referral: Clinical Social Work     Living arrangements for the past 2 months: Single Family Home                                       Social Determinants of Health (SDOH) Interventions SDOH Screenings   Food Insecurity: No Food Insecurity (03/28/2023)  Housing: Patient Declined (03/28/2023)  Transportation Needs: No Transportation Needs (03/28/2023)  Utilities: At Risk (03/28/2023)  Alcohol Screen: Low Risk  (01/05/2023)  Depression (PHQ2-9): Low Risk  (01/05/2023)  Financial Resource Strain: Low Risk  (01/05/2023)  Physical Activity: Inactive (01/05/2023)  Social Connections: Moderately Isolated (01/05/2023)  Stress: No Stress Concern Present (01/05/2023)  Tobacco Use: Medium Risk (03/28/2023)    Readmission Risk Interventions    01/17/2023    1:37 PM  Readmission Risk Prevention Plan  Transportation Screening Complete  PCP or Specialist Appt within 5-7 Days Complete  Home Care Screening Complete  Medication Review (RN CM) Referral to Pharmacy

## 2023-04-06 NOTE — Telephone Encounter (Signed)
Patient's husband calls nurse line regarding Hoyer lift.   Faxed order and patient's demographics at [employer]. Insurance will likely require face to face documentation to proceed with order.    As patient is currently hospitalized, advised husband to also speak with social worker regarding medical equipment needed prior to patient being discharged from the hospital.   Veronda Prude, RN

## 2023-04-06 NOTE — Progress Notes (Signed)
Physical Therapy Treatment Patient Details Name: Anna Bernard MRN: [id number] DOB: [birth year] Today's Date: 04/06/2023   History of Present Illness 72 y.o. female presents to [facility] hospital on 03/27/2023 after syncopal episode. Pt found to have UTI. PMH includes recurrent syncopal episodes, Parkinson disease, PAF, PE, CAD, HTN, prediabetes.    PT Comments  Pt greeted resting in bed and agreeable to session with steady progress towards acute goals. Pt requiring mod A to come to sitting EOB with assist needed to bring Les to and off EOB and to elevate trunk. With increased time pt able to maintain seated balance with min guard with BUE support on back of chair placed in front of pt to encourage upright/extended trunk. Pt able to come to partial stand x3 trials during session with max A to rise. Pt unable to elevate trunk to full upright standing or shift weight for pre-gait activities. Pt able to perform seated balance and LE strengthening/ROM exercises seated EOB with min assist for sequencing and increased ROM. Pt continues to be limited by weakness, decreased activity tolerance, poor balance/postural reactions and poor initiation of mobility. Pt would benefit from continued acute PT services to maximize functional independence and facilitate transition to skilled inpatient follow up therapy, <3 hours/day.     Assistance Recommended at Discharge Frequent or constant Supervision/Assistance  If plan is discharge home, recommend the following:  Can travel by private vehicle    Two people to help with walking and/or transfers;A lot of help with bathing/dressing/bathroom;Assistance with cooking/housework;Direct supervision/assist for medications management;Direct supervision/assist for financial management;Assist for transportation;Help with stairs or ramp for entrance   No  Equipment Recommendations  Hospital bed (hoyer lift)    Recommendations for Other Services       Precautions / Restrictions  Precautions Precautions: Fall Precaution Comments: Parkinson disease with history of syncope Restrictions Weight Bearing Restrictions: No     Mobility  Bed Mobility Overal bed mobility: Needs Assistance Bed Mobility: Rolling, Sidelying to Sit, Sit to Supine Rolling: Max assist   Supine to sit: Mod assist, HOB elevated Sit to supine: Max assist   General bed mobility comments: assist at trunk and BLEs to come to sitting, max A for all aspects to return to supine    Transfers Overall transfer level: Needs assistance Equipment used: 1 person hand held assist Transfers: Sit to/from Stand Sit to Stand: Max assist           General transfer comment: partial stands from EOB with max A  and BUE support on chair for stability,    Ambulation/Gait                   Stairs             Wheelchair Mobility     Tilt Bed    Modified Rankin (Stroke Patients Only)       Balance Overall balance assessment: Needs assistance Sitting-balance support: Single extremity supported, Feet supported Sitting balance-Leahy Scale: Poor Sitting balance - Comments: becoming fair with increased time   Standing balance support: Bilateral upper extremity supported, During functional activity Standing balance-Leahy Scale: Poor Standing balance comment: BUE support in standing                            Cognition Arousal/Alertness: Awake/alert, Lethargic (waxing and waning alertness throughout) Behavior During Therapy: Flat affect Overall Cognitive Status: Impaired/Different from baseline Area of Impairment: Attention, Memory, Following commands, Safety/judgement, Awareness, Problem solving  Orientation Level: Disoriented to, Time, Situation Current Attention Level: Sustained Memory: Decreased short-term memory Following Commands: Follows one step commands with increased time Safety/Judgement: Decreased awareness of safety, Decreased  awareness of deficits Awareness: Intellectual Problem Solving: Slow processing, Decreased initiation, Difficulty sequencing General Comments: Pt following commands with increased time and mod multimodal cues        Exercises General Exercises - Lower Extremity Ankle Circles/Pumps: AROM, Both, 10 reps, Seated Long Arc Quad: AAROM, Right, Left, 10 reps, Seated Hip Flexion/Marching: AROM, Right, Left, 10 reps, Seated Toe Raises: AROM, 10 reps, Both, Seated Heel Raises: AROM, 10 reps, Both, Seated Other Exercises Other Exercises: reaching with trunk rotation R/L x10 Other Exercises: anterior weight shift in preparation for standing with head to this PTAs shoulder for visual target    General Comments General comments (skin integrity, edema, etc.): VSS On RA      Pertinent Vitals/Pain Pain Assessment Pain Assessment: Faces Faces Pain Scale: No hurt Pain Intervention(s): Monitored during session    Home Living                          Prior Function            PT Goals (current goals can now be found in the care plan section) Acute Rehab PT Goals Patient Stated Goal: to improve functional mobility quality, reduce caregiver burden PT Goal Formulation: With patient Time For Goal Achievement: 04/11/23 Progress towards PT goals: Progressing toward goals    Frequency    Min 3X/week      PT Plan Current plan remains appropriate    Co-evaluation              AM-PAC PT "6 Clicks" Mobility   Outcome Measure  Help needed turning from your back to your side while in a flat bed without using bedrails?: A Lot Help needed moving from lying on your back to sitting on the side of a flat bed without using bedrails?: A Lot Help needed moving to and from a bed to a chair (including a wheelchair)?: A Lot Help needed standing up from a chair using your arms (e.g., wheelchair or bedside chair)?: A Lot Help needed to walk in hospital room?: Total Help needed climbing  3-5 steps with a railing? : Total 6 Click Score: 10    End of Session Equipment Utilized During Treatment: Gait belt Activity Tolerance: Patient tolerated treatment well;Patient limited by fatigue Patient left: in bed;with call bell/phone within reach;with bed alarm set Nurse Communication: Mobility status;Other (comment) (pt lunch arriving at end of session) PT Visit Diagnosis: Other abnormalities of gait and mobility (R26.89);Muscle weakness (generalized) (M62.81);Other symptoms and signs involving the nervous system (R29.898)     Time: 7829-5621 PT Time Calculation (min) (ACUTE ONLY): 23 min  Charges:    $Therapeutic Activity: 23-37 mins PT General Charges $$ ACUTE PT VISIT: 1 Visit                     Demaurion Dicioccio R. PTA Acute Rehabilitation Services Office: [phone]   Catalina Antigua 04/06/2023, 1:25 PM

## 2023-04-06 NOTE — Progress Notes (Signed)
Daily Progress Note Intern Pager: [phone]  Patient name: Anna Bernard Medical record number: [id number] Date of birth: 1951/05/10 Age: 72 y.o. Gender: female  Primary Care Provider: Billey Co, MD Consultants: Palliative Code Status: Full  Pt Overview and Major Events to Date:  7/8- patient admitted, fosfomycin 3 g x 1 7/9- + FOB, discussion with palliative regarding GOC 7/10- patient and family discussed goals of care and both agree to SNF placement 7/14 - 7/18 - awaiting placement  Assessment and Plan: Anna Bernard is a 72 yo F who presented after a syncopal episode on 7/8 and was found to have a UTI with gross hematuria. She is currently s/p antibiotics and is stable. She will be going to SNF for balance concern and parkinsonian rigidity.   [facility]     * (Principal) ESBL (extended spectrum beta-lactamase) producing  bacteria infection     Completed fosfomycin treatment. No urinary symptoms.         Essential hypertension     Patients BP normalizing - continue on lopressor 12.5 mg         Parkinson disease     At her baseline. - Continue carbodopa-levodopa. Per neurology, trialed off of medication  and patient felt significantly worse.        Paroxysmal atrial fibrillation (HCC)     NSR -Continue metoprolol 12.5 mg BID        Syncope     Likely multifactorial- general progressive decline, Parkinson's  disease, sedentary with orthostasis. Patient has hx of GI AVMs but less  likely considering patient Hg stable.  - PT/OT recommended SNF - As patient has hx of GI AVMS, will continue protonix - Fall and delirium precautions        Debility     Patient with multifactorial debility-Parkinson's, cognitive decline,  age. - PT/OT continue to follow IP, recommended patient be placed at SNF for  rehab        Hx of pulmonary embolus     Hx PE 05/23. Was not on anticoagulation given bleeding risk and per  chart review, patient has hx of  syncopal episodes.  - SCDs for now        Iron deficiency anemia     Patient has history of IDA with Hg stable for now. Transfused iron  yesterday.  - continue to monitor Hg       FEN/GI: Dysphagia 3 PPx: SCDs Dispo:SNF pending clinical improvement .   Subjective:  Patient seen at bedside. Patient reports she would like someone to help her eat. Otherwise she has no complaints.   Objective: Temp:  [98.4 F (36.9 C)-98.9 F (37.2 C)] 98.9 F (37.2 C) (07/18 0825) Pulse Rate:  [77-80] 80 (07/18 0825) Resp:  [17-18] 17 (07/18 0825) BP: (106-125)/(52-70) 125/58 (07/18 0825) SpO2:  [95 %-98 %] 96 % (07/18 0825) Physical Exam: General: awake, alert, wanted someone to help her eat breakfast Cardiovascular: RRR, no M/R/G Respiratory: NWOB  Abdomen: soft, NTND Extremities: resting tremor with increased rigidity   Laboratory: Most recent CBC Lab Results  Component Value Date   WBC 5.7 04/04/2023   HGB 10.4 (L) 04/04/2023   HCT 31.9 (L) 04/04/2023   MCV 74.5 (L) 04/04/2023   PLT 327 04/04/2023   Most recent BMP    Latest Ref Rng & Units 04/04/2023    2:39 AM  BMP  Glucose 70 - 99 mg/dL 86   BUN 8 - 23 mg/dL  17   Creatinine 0.44 - 1.00 mg/dL 4.01   Sodium 027 - 253 mmol/L 137   Potassium 3.5 - 5.1 mmol/L 4.0   Chloride 98 - 111 mmol/L 99   CO2 22 - 32 mmol/L 28   Calcium 8.9 - 10.3 mg/dL 8.9       Imaging/Diagnostic Tests: No new imaging Cyd Silence, MD 04/06/2023, 10:05 AM  PGY-1, [facility] FPTS Intern pager: [phone], text pages welcome Secure chat group [facility] Teaching Service

## 2023-04-06 NOTE — Plan of Care (Signed)

## 2023-04-06 NOTE — TOC Progression Note (Signed)
Transition of Care [facility]) - Progression Note    Patient Details  Name: Anna Bernard MRN: [id number] Date of Birth: 1951-04-26  Transition of Care [facility]) CM/SW Contact  Clair Alfieri A [place], [place] Phone Number: 04/06/2023, 2:09 PM  Clinical Narrative:     CSW uploaded requested additional clinical documentation of requested therapy note to [facility] Transitions for pt's continued insurance authorization review.   TOC will continue to follow.   Expected Discharge Plan: Skilled Nursing Facility Barriers to Discharge: Continued Medical Work up  Expected Discharge Plan and Services In-house Referral: Clinical Social Work     Living arrangements for the past 2 months: Single Family Home                                       Social Determinants of Health (SDOH) Interventions SDOH Screenings   Food Insecurity: No Food Insecurity (03/28/2023)  Housing: Patient Declined (03/28/2023)  Transportation Needs: No Transportation Needs (03/28/2023)  Utilities: At Risk (03/28/2023)  Alcohol Screen: Low Risk  (01/05/2023)  Depression (PHQ2-9): Low Risk  (01/05/2023)  Financial Resource Strain: Low Risk  (01/05/2023)  Physical Activity: Inactive (01/05/2023)  Social Connections: Moderately Isolated (01/05/2023)  Stress: No Stress Concern Present (01/05/2023)  Tobacco Use: Medium Risk (03/28/2023)    Readmission Risk Interventions    01/17/2023    1:37 PM  Readmission Risk Prevention Plan  Transportation Screening Complete  PCP or Specialist Appt within 5-7 Days Complete  Home Care Screening Complete  Medication Review (RN CM) Referral to Pharmacy

## 2023-04-07 DIAGNOSIS — G20A1 Parkinson's disease without dyskinesia, without mention of fluctuations: Secondary | ICD-10-CM | POA: Diagnosis not present

## 2023-04-07 DIAGNOSIS — E119 Type 2 diabetes mellitus without complications: Secondary | ICD-10-CM | POA: Diagnosis not present

## 2023-04-07 DIAGNOSIS — R531 Weakness: Secondary | ICD-10-CM | POA: Diagnosis not present

## 2023-04-07 DIAGNOSIS — Z741 Need for assistance with personal care: Secondary | ICD-10-CM | POA: Diagnosis not present

## 2023-04-07 DIAGNOSIS — I480 Paroxysmal atrial fibrillation: Secondary | ICD-10-CM | POA: Diagnosis not present

## 2023-04-07 DIAGNOSIS — Z1612 Extended spectrum beta lactamase (ESBL) resistance: Secondary | ICD-10-CM | POA: Diagnosis not present

## 2023-04-07 DIAGNOSIS — E785 Hyperlipidemia, unspecified: Secondary | ICD-10-CM | POA: Diagnosis not present

## 2023-04-07 DIAGNOSIS — Z515 Encounter for palliative care: Secondary | ICD-10-CM | POA: Diagnosis not present

## 2023-04-07 DIAGNOSIS — A499 Bacterial infection, unspecified: Secondary | ICD-10-CM | POA: Diagnosis not present

## 2023-04-07 DIAGNOSIS — I25118 Atherosclerotic heart disease of native coronary artery with other forms of angina pectoris: Secondary | ICD-10-CM | POA: Diagnosis not present

## 2023-04-07 DIAGNOSIS — R1312 Dysphagia, oropharyngeal phase: Secondary | ICD-10-CM | POA: Diagnosis not present

## 2023-04-07 DIAGNOSIS — I10 Essential (primary) hypertension: Secondary | ICD-10-CM | POA: Diagnosis not present

## 2023-04-07 DIAGNOSIS — K922 Gastrointestinal hemorrhage, unspecified: Secondary | ICD-10-CM | POA: Diagnosis not present

## 2023-04-07 DIAGNOSIS — Z743 Need for continuous supervision: Secondary | ICD-10-CM | POA: Diagnosis not present

## 2023-04-07 DIAGNOSIS — R410 Disorientation, unspecified: Secondary | ICD-10-CM | POA: Diagnosis not present

## 2023-04-07 DIAGNOSIS — M6281 Muscle weakness (generalized): Secondary | ICD-10-CM | POA: Diagnosis not present

## 2023-04-07 DIAGNOSIS — R54 Age-related physical debility: Secondary | ICD-10-CM | POA: Diagnosis not present

## 2023-04-07 DIAGNOSIS — R2689 Other abnormalities of gait and mobility: Secondary | ICD-10-CM | POA: Diagnosis not present

## 2023-04-07 DIAGNOSIS — G20A2 Parkinson's disease without dyskinesia, with fluctuations: Secondary | ICD-10-CM | POA: Diagnosis not present

## 2023-04-07 DIAGNOSIS — Z7401 Bed confinement status: Secondary | ICD-10-CM | POA: Diagnosis not present

## 2023-04-07 DIAGNOSIS — N390 Urinary tract infection, site not specified: Secondary | ICD-10-CM | POA: Diagnosis not present

## 2023-04-07 NOTE — Assessment & Plan Note (Signed)
 Likely multifactorial- general progressive decline, Parkinson's disease, sedentary with orthostasis. Patient has hx of GI AVMs but less likely considering patient Hg stable.  - PT/OT recommended SNF - As patient has hx of GI AVMS, will continue protonix - Fall and delirium precautions

## 2023-04-07 NOTE — Plan of Care (Signed)

## 2023-04-07 NOTE — TOC Transition Note (Signed)
Transition of Care [facility]) - CM/SW Discharge Note   Patient Details  Name: Anna Bernard MRN: [id number] Date of Birth: 05-07-1951  Transition of Care [facility]) CM/SW Contact:  Elainna Eshleman A [place], Theresia Majors Phone Number: 04/07/2023, 2:01 PM   Clinical Narrative:     Patient will DC to: [facility]  Anticipated DC date: 04/07/23  Family notified: Rico Junker  Transport by: [facility]  Reference ID: [id number]  Auth ID:  [id number]   Per MD patient ready for DC to [facility] . RN, patient, patient's family, and facility notified of DC. Discharge Summary and FL2 sent to facility. RN to call report prior to discharge ( Room 308, [phone] ). DC packet on chart. Ambulance transport requested for patient.     CSW will sign off for now as social work intervention is no longer needed. Please consult [place] again if new needs arise.   Final next level of care: Skilled Nursing Facility Barriers to Discharge: Barriers Resolved   Patient Goals and CMS Choice CMS Medicare.gov Compare Post Acute Care list provided to:: Patient Choice offered to / list presented to : Patient, Adult Children  Discharge Placement                Patient chooses bed at: [facility] Patient to be transferred to facility by: [facility] Name of family member notified: Rico Junker Patient and family notified of of transfer: 04/07/23  Discharge Plan and Services Additional resources added to the After Visit Summary for   In-house Referral: Clinical Social Work                                   Social Determinants of Health (SDOH) Interventions SDOH Screenings   Food Insecurity: No Food Insecurity (03/28/2023)  Housing: Patient Declined (03/28/2023)  Transportation Needs: No Transportation Needs (03/28/2023)  Utilities: At Risk (03/28/2023)  Alcohol Screen: Low Risk  (01/05/2023)  Depression (PHQ2-9): Low Risk  (01/05/2023)  Financial Resource Strain: Low Risk  (01/05/2023)  Physical Activity: Inactive  (01/05/2023)  Social Connections: Moderately Isolated (01/05/2023)  Stress: No Stress Concern Present (01/05/2023)  Tobacco Use: Medium Risk (03/28/2023)     Readmission Risk Interventions    01/17/2023    1:37 PM  Readmission Risk Prevention Plan  Transportation Screening Complete  PCP or Specialist Appt within 5-7 Days Complete  Home Care Screening Complete  Medication Review (RN CM) Referral to Pharmacy

## 2023-04-07 NOTE — Assessment & Plan Note (Signed)
Completed fosfomycin treatment in ED. No urinary symptoms, however urine this AM looked dark - obtain new UA

## 2023-04-07 NOTE — Progress Notes (Signed)
Attempted to call report. Nurse not available

## 2023-04-07 NOTE — Assessment & Plan Note (Signed)
Patient has history of IDA with Hg stable for now. Transfused iron 7/17 - continue to monitor Hg CBC every 4 days

## 2023-04-07 NOTE — TOC Progression Note (Signed)
Transition of Care [facility]) - Progression Note    Patient Details  Name: Anna Bernard MRN: [id number] Date of Birth: [birth year]  Transition of Care [facility]) CM/SW Contact  Nicolaus Andel A [place], [place] Phone Number: 04/07/2023, 11:12 AM  Clinical Narrative:     CSW contacted pt's daughter, Sarita Haver to inform of discharge. She told CSW that family had chosen [facility] for Palliative outpatient to follow to facility.   CSW then proceeded to follow up with [facility] collective to make referral to Palliative outpatient. Haynes Bast at [facility] confirmed they will follow up with pt and pt's family at [facility].   TOC will continue to follow.   Expected Discharge Plan: Skilled Nursing Facility Barriers to Discharge: Continued Medical Work up  Expected Discharge Plan and Services In-house Referral: Clinical Social Work     Living arrangements for the past 2 months: Single Family Home Expected Discharge Date: 04/07/23                                     Social Determinants of Health (SDOH) Interventions SDOH Screenings   Food Insecurity: No Food Insecurity (03/28/2023)  Housing: Patient Declined (03/28/2023)  Transportation Needs: No Transportation Needs (03/28/2023)  Utilities: At Risk (03/28/2023)  Alcohol Screen: Low Risk  (01/05/2023)  Depression (PHQ2-9): Low Risk  (01/05/2023)  Financial Resource Strain: Low Risk  (01/05/2023)  Physical Activity: Inactive (01/05/2023)  Social Connections: Moderately Isolated (01/05/2023)  Stress: No Stress Concern Present (01/05/2023)  Tobacco Use: Medium Risk (03/28/2023)    Readmission Risk Interventions    01/17/2023    1:37 PM  Readmission Risk Prevention Plan  Transportation Screening Complete  PCP or Specialist Appt within 5-7 Days Complete  Home Care Screening Complete  Medication Review (RN CM) Referral to Pharmacy

## 2023-04-07 NOTE — Assessment & Plan Note (Signed)
Patient with multifactorial debility-Parkinson's, cognitive decline, age. - PT/OT continue to follow IP, recommended patient be placed at [facility] for rehab

## 2023-04-07 NOTE — Assessment & Plan Note (Signed)
Hx PE 05/23. Was not on anticoagulation given bleeding risk and per chart review, patient has hx of syncopal episodes and there was discussion keeping her off anticoagulation.  - SCDs for now

## 2023-04-07 NOTE — Progress Notes (Signed)
[facility] 2W12 [facility] Liaison Note  Notified by Kiva [place], LCSW of request for outpatient palliative services at [facility] after discharge.  Hospital Liaison will follow for discharge disposition.  Please call with any hospice or outpatient palliative care related questions.  Thank you for the opportunity to participate in this patients care.  Haynes Bast, BSN, [employer] [phone]

## 2023-04-10 DIAGNOSIS — I10 Essential (primary) hypertension: Secondary | ICD-10-CM | POA: Diagnosis not present

## 2023-04-10 DIAGNOSIS — I25118 Atherosclerotic heart disease of native coronary artery with other forms of angina pectoris: Secondary | ICD-10-CM | POA: Diagnosis not present

## 2023-04-10 DIAGNOSIS — E785 Hyperlipidemia, unspecified: Secondary | ICD-10-CM | POA: Diagnosis not present

## 2023-04-10 DIAGNOSIS — I480 Paroxysmal atrial fibrillation: Secondary | ICD-10-CM | POA: Diagnosis not present

## 2023-04-11 DIAGNOSIS — R2689 Other abnormalities of gait and mobility: Secondary | ICD-10-CM | POA: Diagnosis not present

## 2023-04-11 DIAGNOSIS — G20A1 Parkinson's disease without dyskinesia, without mention of fluctuations: Secondary | ICD-10-CM | POA: Diagnosis not present

## 2023-04-11 DIAGNOSIS — Z741 Need for assistance with personal care: Secondary | ICD-10-CM | POA: Diagnosis not present

## 2023-04-11 DIAGNOSIS — M6281 Muscle weakness (generalized): Secondary | ICD-10-CM | POA: Diagnosis not present

## 2023-04-11 DIAGNOSIS — K922 Gastrointestinal hemorrhage, unspecified: Secondary | ICD-10-CM | POA: Diagnosis not present

## 2023-04-13 DIAGNOSIS — N390 Urinary tract infection, site not specified: Secondary | ICD-10-CM | POA: Diagnosis not present

## 2023-04-14 ENCOUNTER — Ambulatory Visit: Payer: Medicare Other | Admitting: Physician Assistant

## 2023-04-14 DIAGNOSIS — M6281 Muscle weakness (generalized): Secondary | ICD-10-CM | POA: Diagnosis not present

## 2023-04-14 DIAGNOSIS — G20A1 Parkinson's disease without dyskinesia, without mention of fluctuations: Secondary | ICD-10-CM | POA: Diagnosis not present

## 2023-04-14 DIAGNOSIS — Z741 Need for assistance with personal care: Secondary | ICD-10-CM | POA: Diagnosis not present

## 2023-04-14 DIAGNOSIS — K922 Gastrointestinal hemorrhage, unspecified: Secondary | ICD-10-CM | POA: Diagnosis not present

## 2023-04-14 DIAGNOSIS — R2689 Other abnormalities of gait and mobility: Secondary | ICD-10-CM | POA: Diagnosis not present

## 2023-04-17 DIAGNOSIS — M6281 Muscle weakness (generalized): Secondary | ICD-10-CM | POA: Diagnosis not present

## 2023-04-17 DIAGNOSIS — I480 Paroxysmal atrial fibrillation: Secondary | ICD-10-CM | POA: Diagnosis not present

## 2023-04-17 DIAGNOSIS — R54 Age-related physical debility: Secondary | ICD-10-CM | POA: Diagnosis not present

## 2023-04-17 DIAGNOSIS — G20A1 Parkinson's disease without dyskinesia, without mention of fluctuations: Secondary | ICD-10-CM | POA: Diagnosis not present

## 2023-04-17 DIAGNOSIS — G20A2 Parkinson's disease without dyskinesia, with fluctuations: Secondary | ICD-10-CM | POA: Diagnosis not present

## 2023-04-17 DIAGNOSIS — Z515 Encounter for palliative care: Secondary | ICD-10-CM | POA: Diagnosis not present

## 2023-04-17 DIAGNOSIS — I10 Essential (primary) hypertension: Secondary | ICD-10-CM | POA: Diagnosis not present

## 2023-04-19 DIAGNOSIS — G20A1 Parkinson's disease without dyskinesia, without mention of fluctuations: Secondary | ICD-10-CM | POA: Diagnosis not present

## 2023-04-19 DIAGNOSIS — K922 Gastrointestinal hemorrhage, unspecified: Secondary | ICD-10-CM | POA: Diagnosis not present

## 2023-04-19 DIAGNOSIS — M6281 Muscle weakness (generalized): Secondary | ICD-10-CM | POA: Diagnosis not present

## 2023-04-19 DIAGNOSIS — Z741 Need for assistance with personal care: Secondary | ICD-10-CM | POA: Diagnosis not present

## 2023-04-19 DIAGNOSIS — R2689 Other abnormalities of gait and mobility: Secondary | ICD-10-CM | POA: Diagnosis not present

## 2023-04-20 DIAGNOSIS — I10 Essential (primary) hypertension: Secondary | ICD-10-CM | POA: Diagnosis not present

## 2023-04-20 DIAGNOSIS — G20A2 Parkinson's disease without dyskinesia, with fluctuations: Secondary | ICD-10-CM | POA: Diagnosis not present

## 2023-04-25 DIAGNOSIS — Z515 Encounter for palliative care: Secondary | ICD-10-CM | POA: Diagnosis not present

## 2023-04-25 DIAGNOSIS — R54 Age-related physical debility: Secondary | ICD-10-CM | POA: Diagnosis not present

## 2023-04-25 DIAGNOSIS — I480 Paroxysmal atrial fibrillation: Secondary | ICD-10-CM | POA: Diagnosis not present

## 2023-04-25 DIAGNOSIS — G20A1 Parkinson's disease without dyskinesia, without mention of fluctuations: Secondary | ICD-10-CM | POA: Diagnosis not present

## 2023-04-25 DIAGNOSIS — M6281 Muscle weakness (generalized): Secondary | ICD-10-CM | POA: Diagnosis not present

## 2023-04-26 DIAGNOSIS — R2689 Other abnormalities of gait and mobility: Secondary | ICD-10-CM | POA: Diagnosis not present

## 2023-04-26 DIAGNOSIS — G20A1 Parkinson's disease without dyskinesia, without mention of fluctuations: Secondary | ICD-10-CM | POA: Diagnosis not present

## 2023-04-26 DIAGNOSIS — M6281 Muscle weakness (generalized): Secondary | ICD-10-CM | POA: Diagnosis not present

## 2023-04-26 DIAGNOSIS — K922 Gastrointestinal hemorrhage, unspecified: Secondary | ICD-10-CM | POA: Diagnosis not present

## 2023-04-26 DIAGNOSIS — Z741 Need for assistance with personal care: Secondary | ICD-10-CM | POA: Diagnosis not present

## 2023-04-28 ENCOUNTER — Other Ambulatory Visit: Payer: Self-pay | Admitting: Family Medicine

## 2023-04-28 DIAGNOSIS — R2689 Other abnormalities of gait and mobility: Secondary | ICD-10-CM | POA: Diagnosis not present

## 2023-04-28 DIAGNOSIS — I10 Essential (primary) hypertension: Secondary | ICD-10-CM | POA: Diagnosis not present

## 2023-04-28 DIAGNOSIS — I25118 Atherosclerotic heart disease of native coronary artery with other forms of angina pectoris: Secondary | ICD-10-CM | POA: Diagnosis not present

## 2023-04-28 DIAGNOSIS — G20A2 Parkinson's disease without dyskinesia, with fluctuations: Secondary | ICD-10-CM | POA: Diagnosis not present

## 2023-04-28 DIAGNOSIS — I480 Paroxysmal atrial fibrillation: Secondary | ICD-10-CM | POA: Diagnosis not present

## 2023-04-28 DIAGNOSIS — K922 Gastrointestinal hemorrhage, unspecified: Secondary | ICD-10-CM | POA: Diagnosis not present

## 2023-04-28 DIAGNOSIS — M6281 Muscle weakness (generalized): Secondary | ICD-10-CM | POA: Diagnosis not present

## 2023-04-30 DIAGNOSIS — M6281 Muscle weakness (generalized): Secondary | ICD-10-CM | POA: Diagnosis not present

## 2023-04-30 DIAGNOSIS — K922 Gastrointestinal hemorrhage, unspecified: Secondary | ICD-10-CM | POA: Diagnosis not present

## 2023-04-30 DIAGNOSIS — N390 Urinary tract infection, site not specified: Secondary | ICD-10-CM | POA: Diagnosis not present

## 2023-05-01 ENCOUNTER — Telehealth: Payer: Self-pay

## 2023-05-01 NOTE — Telephone Encounter (Signed)
Arlys John from [employer] calling for PT verbal orders as follows:  2 time(s) weekly for 8 week(s), then 1 time(s) weekly for 1 week(s)  Verbal orders given per [facility] protocol  Veronda Prude, RN

## 2023-05-02 DIAGNOSIS — N390 Urinary tract infection, site not specified: Secondary | ICD-10-CM | POA: Diagnosis not present

## 2023-05-02 DIAGNOSIS — K922 Gastrointestinal hemorrhage, unspecified: Secondary | ICD-10-CM | POA: Diagnosis not present

## 2023-05-02 DIAGNOSIS — M6281 Muscle weakness (generalized): Secondary | ICD-10-CM | POA: Diagnosis not present

## 2023-05-03 DIAGNOSIS — N390 Urinary tract infection, site not specified: Secondary | ICD-10-CM | POA: Diagnosis not present

## 2023-05-03 DIAGNOSIS — M6281 Muscle weakness (generalized): Secondary | ICD-10-CM | POA: Diagnosis not present

## 2023-05-03 DIAGNOSIS — K922 Gastrointestinal hemorrhage, unspecified: Secondary | ICD-10-CM | POA: Diagnosis not present

## 2023-05-04 ENCOUNTER — Ambulatory Visit (INDEPENDENT_AMBULATORY_CARE_PROVIDER_SITE_OTHER): Payer: Medicare Other | Admitting: Family Medicine

## 2023-05-04 ENCOUNTER — Encounter: Payer: Self-pay | Admitting: Family Medicine

## 2023-05-04 ENCOUNTER — Other Ambulatory Visit: Payer: Self-pay

## 2023-05-04 VITALS — BP 161/76 | HR 74

## 2023-05-04 DIAGNOSIS — I10 Essential (primary) hypertension: Secondary | ICD-10-CM | POA: Diagnosis not present

## 2023-05-04 DIAGNOSIS — Z23 Encounter for immunization: Secondary | ICD-10-CM

## 2023-05-04 DIAGNOSIS — Z0000 Encounter for general adult medical examination without abnormal findings: Secondary | ICD-10-CM | POA: Diagnosis not present

## 2023-05-04 NOTE — Assessment & Plan Note (Signed)
Discussed with patient and family existing care gaps. -Per patient request, Tdap vaccine given today -Patient will check with pharmacy if she has ever had shingles vaccine; if not she will consider. -Patient handles her own diabetic eye exams; due soon and will schedule this herself.

## 2023-05-04 NOTE — Patient Instructions (Signed)
Dear Anna Bernard  I am so glad you have been doing better since your hospitalization!  Today we discussed the following concerns and plans:  Blood pressure: -Please monitor your blood pressure at home and keep track of the numbers.  Please bring this to your next appointment. -Continue to take your metoprolol tartrate 12.5 mg (1/2 pill) 2 times a day. -If you have severe headache, blurry vision, chest pressure, or difficulty breathing please check your blood pressure.  If it is high (180s-190s) please call the office or report to the emergency department immediately.   If you have any concerns, please call the clinic or schedule an appointment.  It was a pleasure to take care of you today. Be well!  Cyndia Skeeters, DO [facility], PGY-1

## 2023-05-04 NOTE — Assessment & Plan Note (Addendum)
Initial blood pressure today 155/77.  Repeat blood pressure slightly higher at 161/76.  Patient currently on Lopressor 12.5 mg twice daily. -Patient and family will check blood pressure at home regularly and write these down.  Will bring to next appointment for review. -No medication changes at this time.

## 2023-05-04 NOTE — Progress Notes (Addendum)
    SUBJECTIVE:   CHIEF COMPLAINT / HPI:  Mrs. Vegter is a pleasant patient who was recently admitted to [facility] for syncope on 03/27/2023 and was discharged on 04/07/2023.  Seen today for follow-up from hospitalization.  Hospital issues for follow up: Patient has history of ESBL UTI x2, please ask about dysuria, frequency, etc - no urinary symptoms or concerns since discharge Patient BP normotensive to slightly hypertensive during admission, consider readjusting BP medications - no HA, change in vision at home; see plan below Patient has history of small bowel AVMs and was prescribed protonix. Patient ran out of these and was restarted during admission. Follow up regarding adherence. - taking without issue Patient has history of hammertoe. Please follow up regarding pain management. - no pain concerns since discharge  Accompanied today by husband and daughter.  She states she has been doing well since discharge.  She is taking all of her medications as prescribed.  She denies any episodes of syncope, dizziness/lightheadedness, changes in vision, chest pain, shortness of breath, nausea, vomiting, blood in stool, UTI symptoms.  She reports improving energy and good appetite.  Family notes that very occasionally patient will awaken from sleep and walk around the house as if she is going somewhere, though patient does not remember these episodes the next morning.  Family also reports that patient is going to have PT at home 2 times a week for the next 2 months.  Patient is looking forward to this and hoping to improve her strength and balance.  PERTINENT  PMH / PSH:  Past Medical History:  Diagnosis Date   Anemia, unspecified    Carotid artery disease (HCC)    Hyperlipidemia, unspecified    Hypertension    Hypothyroidism, unspecified    Mild CAD    PAF (paroxysmal atrial fibrillation) (HCC)    Pre-diabetes    Pulmonary emboli (HCC)    Stenosis of right subclavian artery (HCC)     possible by duplex 2023   Vitamin B12 deficiency      OBJECTIVE:   BP (!) 161/76   Pulse 74   SpO2 100%   General: Very pleasant, no acute distress Cardio: Regular rate, regular rhythm, no murmurs on exam. Pulm: Clear, no wheezing, no crackles. No increased work of breathing. Abdominal: soft, non-tender, non-distended Extremities: no peripheral edema, mild resting tremor Neuro: alert and oriented, speech normal in content. Psych:  Cognition and judgment appear intact. Alert, communicative  and cooperative.   ASSESSMENT/PLAN:   Essential hypertension Initial blood pressure today 155/77.  Repeat blood pressure slightly higher at 161/76.  Patient currently on Lopressor 12.5 mg twice daily. -Patient and family will check blood pressure at home regularly and write these down.  Will bring to next appointment for review. -No medication changes at this time.  Healthcare maintenance Discussed with patient and family existing care gaps. -Per patient request, Tdap vaccine given today -Patient will check with pharmacy if she has ever had shingles vaccine; if not she will consider. -Patient handles her own diabetic eye exams; due soon and will schedule this herself.      Cyndia Skeeters, DO [facility]

## 2023-05-10 DIAGNOSIS — M6281 Muscle weakness (generalized): Secondary | ICD-10-CM | POA: Diagnosis not present

## 2023-05-10 DIAGNOSIS — N390 Urinary tract infection, site not specified: Secondary | ICD-10-CM | POA: Diagnosis not present

## 2023-05-10 DIAGNOSIS — K922 Gastrointestinal hemorrhage, unspecified: Secondary | ICD-10-CM | POA: Diagnosis not present

## 2023-05-16 DIAGNOSIS — N390 Urinary tract infection, site not specified: Secondary | ICD-10-CM | POA: Diagnosis not present

## 2023-05-16 DIAGNOSIS — M6281 Muscle weakness (generalized): Secondary | ICD-10-CM | POA: Diagnosis not present

## 2023-05-16 DIAGNOSIS — K922 Gastrointestinal hemorrhage, unspecified: Secondary | ICD-10-CM | POA: Diagnosis not present

## 2023-05-18 DIAGNOSIS — K922 Gastrointestinal hemorrhage, unspecified: Secondary | ICD-10-CM | POA: Diagnosis not present

## 2023-05-18 DIAGNOSIS — N390 Urinary tract infection, site not specified: Secondary | ICD-10-CM | POA: Diagnosis not present

## 2023-05-18 DIAGNOSIS — M6281 Muscle weakness (generalized): Secondary | ICD-10-CM | POA: Diagnosis not present

## 2023-05-25 DIAGNOSIS — K922 Gastrointestinal hemorrhage, unspecified: Secondary | ICD-10-CM | POA: Diagnosis not present

## 2023-05-25 DIAGNOSIS — M6281 Muscle weakness (generalized): Secondary | ICD-10-CM | POA: Diagnosis not present

## 2023-05-25 DIAGNOSIS — N390 Urinary tract infection, site not specified: Secondary | ICD-10-CM | POA: Diagnosis not present

## 2023-05-26 ENCOUNTER — Telehealth: Payer: Self-pay

## 2023-05-26 NOTE — Telephone Encounter (Signed)
[facility] PT LVM on nurse line to report [facility] PT delay.   She reports at home visit bedbugs were noted and therefore they can not return until professionally cleaned.   I tried calling Luther Parody back to make sure patient is aware and has been able to contact a service.   VM left for [facility].

## 2023-06-12 DIAGNOSIS — H34831 Tributary (branch) retinal vein occlusion, right eye, with macular edema: Secondary | ICD-10-CM | POA: Diagnosis not present

## 2023-06-21 DIAGNOSIS — M205X1 Other deformities of toe(s) (acquired), right foot: Secondary | ICD-10-CM | POA: Diagnosis not present

## 2023-06-21 DIAGNOSIS — M2041 Other hammer toe(s) (acquired), right foot: Secondary | ICD-10-CM | POA: Diagnosis not present

## 2023-07-27 ENCOUNTER — Telehealth: Payer: Self-pay | Admitting: Family Medicine

## 2023-07-27 ENCOUNTER — Other Ambulatory Visit: Payer: Self-pay | Admitting: Family Medicine

## 2023-07-27 DIAGNOSIS — E039 Hypothyroidism, unspecified: Secondary | ICD-10-CM

## 2023-07-27 DIAGNOSIS — M545 Low back pain, unspecified: Secondary | ICD-10-CM

## 2023-07-27 NOTE — Telephone Encounter (Signed)
Patient's daughter came in stating that patient took a test at home for an UTI and tested positive. Wants to know if her doctor could call in medication please. If there are any questions please call her daughter, Doy Hutching, at [phone].

## 2023-07-28 ENCOUNTER — Ambulatory Visit (INDEPENDENT_AMBULATORY_CARE_PROVIDER_SITE_OTHER): Payer: Medicare Other | Admitting: Student

## 2023-07-28 ENCOUNTER — Other Ambulatory Visit: Payer: Self-pay | Admitting: Neurology

## 2023-07-28 ENCOUNTER — Ambulatory Visit: Payer: Medicare Other

## 2023-07-28 ENCOUNTER — Other Ambulatory Visit: Payer: Self-pay

## 2023-07-28 VITALS — BP 145/70 | HR 84 | Temp 98.8°F

## 2023-07-28 DIAGNOSIS — R3989 Other symptoms and signs involving the genitourinary system: Secondary | ICD-10-CM | POA: Diagnosis not present

## 2023-07-28 DIAGNOSIS — G214 Vascular parkinsonism: Secondary | ICD-10-CM

## 2023-07-28 MED ORDER — NITROFURANTOIN MONOHYD MACRO 100 MG PO CAPS: 100.0000 mg | ORAL_CAPSULE | Freq: Two times a day (BID) | ORAL | 0 refills | Status: AC

## 2023-07-28 NOTE — Telephone Encounter (Signed)
Called patients daughter.  She reports the patient has been complaining of not feeling well for the last 2 days. She reports her urine is "very dark" and has an abnormal odor.   She denies any hematuria, fevers, chills, nausea/vomiting or abdominal pain.  Patient scheduled for this afternoon in ATC.

## 2023-07-28 NOTE — Progress Notes (Unsigned)
    SUBJECTIVE:   CHIEF COMPLAINT / HPI:   Change in Urine  -Reports that patient has dark urine  -Has not been feeling well.   PERTINENT  PMH / PSH: ***  OBJECTIVE:   BP (!) 149/73   Pulse 84   Temp 98.8 F (37.1 C)   SpO2 94%   ***  ASSESSMENT/PLAN:   Assessment & Plan Abnormal urine color      Alfredo Martinez, MD [facility]

## 2023-07-28 NOTE — Patient Instructions (Addendum)
It was great to see you today! Thank you for choosing [facility] for your primary care.  Today we addressed: We will check some more labs today  Nitrofurantoin 1 capsule twice a day for five days  Return if worsening   If you haven't already, sign up for My Chart to have easy access to your labs results, and communication with your primary care physician. I recommend that you always bring your medications to each appointment as this makes it easy to ensure you are on the correct medications and helps [place] not miss refills when you need them. Call the clinic at [phone] if your symptoms worsen or you have any concerns. Return in about 3 weeks (around 08/18/2023). Please arrive 15 minutes before your appointment to ensure smooth check in process.  We appreciate your efforts in making this happen.  Thank you for allowing me to participate in your care, Alfredo Martinez, MD 07/28/2023, 3:12 PM PGY-3, [facility]

## 2023-07-29 LAB — CBC WITH DIFFERENTIAL/PLATELET
Basophils Absolute: 0 10*3/uL (ref 0.0–0.2)
Basos: 1 %
EOS (ABSOLUTE): 0.1 10*3/uL (ref 0.0–0.4)
Eos: 1 %
Hematocrit: 40 % (ref 34.0–46.6)
Hemoglobin: 12.5 g/dL (ref 11.1–15.9)
Immature Grans (Abs): 0 10*3/uL (ref 0.0–0.1)
Immature Granulocytes: 0 %
Lymphocytes Absolute: 1.6 10*3/uL (ref 0.7–3.1)
Lymphs: 25 %
MCH: 25.9 pg — ABNORMAL LOW (ref 26.6–33.0)
MCHC: 31.3 g/dL — ABNORMAL LOW (ref 31.5–35.7)
MCV: 83 fL (ref 79–97)
Monocytes Absolute: 0.3 10*3/uL (ref 0.1–0.9)
Monocytes: 5 %
Neutrophils Absolute: 4.5 10*3/uL (ref 1.4–7.0)
Neutrophils: 68 %
Platelets: 347 10*3/uL (ref 150–450)
RBC: 4.83 x10E6/uL (ref 3.77–5.28)
RDW: 14.3 % (ref 11.7–15.4)
WBC: 6.5 10*3/uL (ref 3.4–10.8)

## 2023-07-29 LAB — COMPREHENSIVE METABOLIC PANEL
ALT: 15 [IU]/L (ref 0–32)
AST: 18 [IU]/L (ref 0–40)
Albumin: 4.2 g/dL (ref 3.9–4.9)
Alkaline Phosphatase: 87 [IU]/L (ref 44–121)
BUN/Creatinine Ratio: 11 — ABNORMAL LOW (ref 12–28)
BUN: 8 mg/dL (ref 8–27)
Bilirubin Total: 0.4 mg/dL (ref 0.0–1.2)
CO2: 26 mmol/L (ref 20–29)
Calcium: 9.3 mg/dL (ref 8.7–10.3)
Chloride: 106 mmol/L (ref 96–106)
Creatinine, Ser: 0.73 mg/dL (ref 0.57–1.00)
Globulin, Total: 3.3 g/dL (ref 1.5–4.5)
Glucose: 103 mg/dL — ABNORMAL HIGH (ref 70–99)
Potassium: 4.5 mmol/L (ref 3.5–5.2)
Sodium: 145 mmol/L — ABNORMAL HIGH (ref 134–144)
Total Protein: 7.5 g/dL (ref 6.0–8.5)
eGFR: 88 mL/min/{1.73_m2} (ref >59)

## 2023-07-29 LAB — TSH RFX ON ABNORMAL TO FREE T4: TSH: 1.22 u[IU]/mL (ref 0.450–4.500)

## 2023-08-08 NOTE — Progress Notes (Deleted)
    SUBJECTIVE:   CHIEF COMPLAINT / HPI:   Goals of care  UTI   PERTINENT  PMH / PSH: ***  OBJECTIVE:   There were no vitals taken for this visit.  ***  ASSESSMENT/PLAN:   No problem-specific Assessment & Plan notes found for this encounter.     Billey Co, MD [facility]

## 2023-08-10 ENCOUNTER — Ambulatory Visit: Payer: Medicare Other | Admitting: Family Medicine

## 2023-08-22 ENCOUNTER — Other Ambulatory Visit: Payer: Self-pay | Admitting: Family Medicine

## 2023-08-22 DIAGNOSIS — K219 Gastro-esophageal reflux disease without esophagitis: Secondary | ICD-10-CM

## 2023-08-22 NOTE — Progress Notes (Unsigned)
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  This did not show efficacy of levodopa.  However, when I took the patient off of levodopa for 24 hours prior to seeing her , in anticipation for levodopa challenge test, the patient and her husband reported that she did significantly worse off of the medication.  They therefore have opted to stay on the medication.             -***carbidopa/levodopa CR, 2 at 9-10am, 2 at 1pm, 2 at 5pm, 1 at 7-8pm  -has a walker at home but has not used it.  Needs to start using it at all times.    -discussed shower chair and they have one         2.  Atrial fibrillation             -Patient on Eliquis, but they are likely going to place Watchman device soon because of multiple syncopal episodes  3.  History of symptomatic anemia  -Has contributed to the syncope.  -Due to small bowel AVM  4.  Multiple syncopal episodes  -Cardiology feels that these are orthostatic in nature, although many of these occurred while seated.  -the most recent was due to valsalva with BM  -Cardiology following and managing.  -remains on lopressor  Subjective:   Anna Bernard was seen today in follow up for Parkinsons disease.  My previous records were reviewed prior to todays visit as well as outside records available to me.  Patient accompanied by husband and daughter who supplements the history.  Unfortunately, patient was in the hospital again after our last visit due to another syncopal episode.  This occurred while having a bowel movement.  She had prolonged loss of consciousness with this one.  She also had a urinary tract infection.  She was treated and d/c home.    Current prescribed movement disorder medications: Carbidopa/levodopa 25/100 CR, 2 at 9-10am, 2 at 1pm, 2 at 5pm, 1 at 7-8pm (timing of dosages changed last visit)  PREVIOUS MEDICATIONS: Sinemet  ALLERGIES:  No Known Allergies  CURRENT  MEDICATIONS:  Outpatient Encounter Medications as of 08/24/2023  Medication Sig   Carbidopa-Levodopa ER (SINEMET CR) 25-100 MG tablet controlled release TAKE 3 TABLETS AT 7:00AM, 2 TABLETS AT 11:00AM, AND 2 TABLETS AT 4:00PM DAILY   diclofenac Sodium (VOLTAREN) 1 % GEL APPLY 4 GRAMS TOPICALLY 4 TIMES A DAY   levothyroxine (SYNTHROID) 100 MCG tablet TAKE 1 TABLET BY MOUTH EVERY MORNING 30 MINUTES BEFORE FOOD   melatonin 3 MG TABS tablet Take 3 mg by mouth at bedtime as needed (For sleep).   metoprolol tartrate (LOPRESSOR) 25 MG tablet TAKE 1/2 TABLET TWICE A DAY BY MOUTH   Multiple Vitamin (MULTIVITAMIN WITH MINERALS) TABS Take 1 tablet by mouth daily with breakfast.   pantoprazole (PROTONIX) 40 MG tablet NEW PRESCRIPTION REQUEST: TAKE ONE TABLET BY MOUTH TWICE DAILY (Patient taking differently: Take 40 mg by mouth 2 (two) times daily.)   rosuvastatin (CRESTOR) 10 MG tablet Take 1 tablet (10 mg total) by mouth every evening.   senna-docusate (SENOKOT-S) 8.6-50 MG tablet Take 1 tablet by mouth at bedtime. Hold if diarrhea   TYLENOL 8 HOUR ARTHRITIS PAIN 650 MG CR tablet Take 650-1,300 mg by mouth every 8 (eight) hours as needed for pain.   No facility-administered encounter medications on file as of 08/24/2023.  Objective:   PHYSICAL EXAMINATION:    VITALS:   There were no vitals filed for this visit.    GEN:  The patient appears stated age and is in NAD. HEENT:  Normocephalic, atraumatic.  The mucous membranes are moist. The superficial temporal arteries are without ropiness or tenderness. CV:  RRR Lungs:  CTAB Neck/HEME:  There are no carotid bruits bilaterally.  Neurological examination:  Orientation: The patient is alert and oriented x3. Cranial nerves: There is good facial symmetry with facial hypomimia.  She would intermittently close 1 eye, but stated that she had no double vision.  She stated that she was purposefully closing it because she was sleepy.  This is the same as  what she has done in the past.  The speech is fluent and clear. Soft palate rises symmetrically and there is no tongue deviation. Hearing is intact to conversational tone. Sensation: Sensation is intact to light touch throughout Motor: Strength is at least antigravity x4.   Movement examination: Tone: There is nl tone in the ue/le Abnormal movements: none Coordination:  There is decremation with RAM's, with any form of RAMS, including alternating supination and pronation of the forearm, hand opening and closing, finger taps, heel taps and toe taps, mostly on the right.  This is the same as last visit.  She does have on a hard boot (orthopedic) Gait and Station: She is assisted out of the transport chair.  She is given a walker.  She is slow and shuffles   I have reviewed and interpreted the following labs independently    Chemistry      Component Value Date/Time   NA 145 (H) 07/28/2023 1521   K 4.5 07/28/2023 1521   CL 106 07/28/2023 1521   CO2 26 07/28/2023 1521   BUN 8 07/28/2023 1521   CREATININE 0.73 07/28/2023 1521   CREATININE 0.52 04/21/2013 1526   GLU 133 08/27/2012 0000      Component Value Date/Time   CALCIUM 9.3 07/28/2023 1521   ALKPHOS 87 07/28/2023 1521   AST 18 07/28/2023 1521   ALT 15 07/28/2023 1521   BILITOT 0.4 07/28/2023 1521       Lab Results  Component Value Date   WBC 6.5 07/28/2023   HGB 12.5 07/28/2023   HCT 40.0 07/28/2023   MCV 83 07/28/2023   PLT 347 07/28/2023    Lab Results  Component Value Date   TSH 1.220 07/28/2023   Total time spent on today's visit was *** minutes, including both face-to-face time and nonface-to-face time.  Time included that spent on review of records (prior notes available to me/labs/imaging if pertinent), discussing treatment and goals, answering patient's questions and coordinating care.   Cc:  Billey Co, MD

## 2023-08-24 ENCOUNTER — Encounter: Payer: Self-pay | Admitting: Neurology

## 2023-08-24 ENCOUNTER — Ambulatory Visit: Payer: Medicare Other | Admitting: Neurology

## 2023-08-25 ENCOUNTER — Telehealth: Payer: Self-pay | Admitting: Neurology

## 2023-08-25 NOTE — Telephone Encounter (Signed)
Pt's daughter called in stating the pt's husband had told her Dr.Tat had called him this morning. She is wanting to be called back. I let her know she is not on the DPR and they would have to call the pt's husband back.

## 2023-08-26 ENCOUNTER — Other Ambulatory Visit: Payer: Self-pay | Admitting: Family Medicine

## 2023-08-29 NOTE — Progress Notes (Unsigned)
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  This did not show efficacy of levodopa.  However, when I took the patient off of levodopa for 24 hours prior to seeing her, in anticipation for levodopa challenge test, the patient and her husband reported that she did significantly worse off of the medication.  They therefore have opted to stay on the medication.             -For now will carbidopa/levodopa CR, 2 at 9-10am, 2 at 1pm, 2 at 5pm, 1 at 7-8pm.  Given that she is no longer walking, I may consider trying to wean her off of this in the future.  2.  Atrial fibrillation             -Patient off of eliquis and they decided to hold on the watchman device.  3.  History of symptomatic anemia  -Has contributed to the syncope.  -Due to small bowel AVM  4.  Multiple syncopal episodes  -Cardiology feels that these are orthostatic in nature, although many of these occurred while seated.  -the most recent was due to valsalva with [facility]  -Cardiology following and managing.  -remains on lopressor 5.  Dementia  -She has some apraxia, which is part of the issue with ambulation now.  Deconditioning is also an issue.  -Long discussion on the importance of regular daily schedule.  -Husband is ill with pancreatic cancer and told them that they needed to come up with a plan if/when he is no longer able to caregiver.  Patient's daughter and caregiver (who is her sister-in-law) stated that they have done that.  -Discussed importance of physical and mental exercises.   Subjective:   [name] was seen today in follow up for Parkinsons disease.  My previous records were reviewed prior to todays visit as well as outside records available to me.  Patient accompanied by daughter and caregiver who supplements the history.  Unfortunately, patient was in the hospital again after our last visit due to another syncopal episode.  This occurred  while having a bowel movement.  She had prolonged loss of consciousness with this one.  She also had a urinary tract infection.  She was treated and d/c home.  No further syncopal episodes.  BP has been running good.  Caregiver is also her sister in law and is there 3 days/week.  Her husband and daugher are there at other times.  She doesn't walk much unless someone is helping her  and mostly she is transferring.  Many times, the hoyer lift is used.  They ask about feeding weighted utensils.  Memory has been very poor.  No hallucinations.  Current prescribed movement disorder medications: Carbidopa/levodopa 25/100 CR, 2 at 9-10am, 2 at 1pm, 2 at 5pm, 1 at 7-8pm (timing of dosages changed last visit)  PREVIOUS MEDICATIONS: Sinemet  ALLERGIES:  No Known Allergies  CURRENT MEDICATIONS:  Outpatient Encounter Medications as of 08/30/2023  Medication Sig   Carbidopa-Levodopa ER (SINEMET CR) 25-100 MG tablet controlled release TAKE 3 TABLETS AT 7:00AM, 2 TABLETS AT 11:00AM, AND 2 TABLETS AT 4:00PM DAILY   diclofenac Sodium (VOLTAREN) 1 % GEL APPLY 4 GRAMS TOPICALLY 4 TIMES A DAY   levothyroxine (SYNTHROID) 100 MCG tablet TAKE 1 TABLET BY MOUTH EVERY MORNING 30 MINUTES BEFORE FOOD   melatonin 3 MG TABS tablet Take 3 mg by mouth  at bedtime as needed (For sleep).   metoprolol tartrate (LOPRESSOR) 25 MG tablet TAKE 1/2 TABLET TWICE A DAY BY MOUTH   Multiple Vitamin (MULTIVITAMIN WITH MINERALS) TABS Take 1 tablet by mouth daily with breakfast.   pantoprazole (PROTONIX) 40 MG tablet NEW PRESCRIPTION REQUEST: TAKE ONE TABLET BY MOUTH TWICE DAILY (Patient taking differently: Take 40 mg by mouth 2 (two) times daily.)   rosuvastatin (CRESTOR) 10 MG tablet Take 1 tablet (10 mg total) by mouth every evening.   senna-docusate (SENOKOT-S) 8.6-50 MG tablet Take 1 tablet by mouth at bedtime. Hold if diarrhea   TYLENOL 8 HOUR ARTHRITIS PAIN 650 MG CR tablet Take 650-1,300 mg by mouth every 8 (eight) hours as needed for  pain.   No facility-administered encounter medications on file as of 08/30/2023.    Objective:   PHYSICAL EXAMINATION:    VITALS:   Vitals:   08/30/23 1512  BP: 126/74  Pulse: 76  SpO2: 95%  Weight: 169 lb (76.7 kg)  Height: 5\' 2"  (1.575 m)      GEN:  The patient appears stated age and is in NAD. HEENT:  Normocephalic, atraumatic.  The mucous membranes are moist. The superficial temporal arteries are without ropiness or tenderness. CV:  RRR Lungs:  CTAB Neck/HEME:  There are no carotid bruits bilaterally.  Neurological examination:  Orientation: MMSE done today was 7 Cranial nerves: There is good facial symmetry with facial hypomimia.  She would intermittently close 1 eye, but stated that she had no double vision.  She stated that she was purposefully closing it because she was sleepy.  This is the same as what she has done in the past.  The speech is fluent and clear. Soft palate rises symmetrically and there is no tongue deviation. Hearing is intact to conversational tone. Sensation: Sensation is intact to light touch throughout Motor: Strength is at least antigravity x4.   Movement examination: Tone: There is nl tone in the ue/le Abnormal movements: none Coordination:  There is decremation with rapid alternating movements, but there is also some apraxia today. Gait and Station: Not tested, as she no longer ambulates I have reviewed and interpreted the following labs independently    Chemistry      Component Value Date/Time   NA 145 (H) 07/28/2023 1521   K 4.5 07/28/2023 1521   CL 106 07/28/2023 1521   CO2 26 07/28/2023 1521   BUN 8 07/28/2023 1521   CREATININE 0.73 07/28/2023 1521   CREATININE 0.52 04/21/2013 1526   GLU 133 08/27/2012 0000      Component Value Date/Time   CALCIUM 9.3 07/28/2023 1521   ALKPHOS 87 07/28/2023 1521   AST 18 07/28/2023 1521   ALT 15 07/28/2023 1521   BILITOT 0.4 07/28/2023 1521       Lab Results  Component Value Date    WBC 6.5 07/28/2023   HGB 12.5 07/28/2023   HCT 40.0 07/28/2023   MCV 83 07/28/2023   PLT 347 07/28/2023    Lab Results  Component Value Date   TSH 1.220 07/28/2023   Total time spent on today's visit was 40 minutes, including both face-to-face time and nonface-to-face time.  Time included that spent on review of records (prior notes available to me/labs/imaging if pertinent), discussing treatment and goals, answering patient's questions and coordinating care.   Cc:  Billey Co, MD

## 2023-08-30 ENCOUNTER — Ambulatory Visit: Payer: Medicare Other | Admitting: Neurology

## 2023-08-30 ENCOUNTER — Encounter: Payer: Self-pay | Admitting: Neurology

## 2023-08-30 ENCOUNTER — Other Ambulatory Visit: Payer: Self-pay

## 2023-08-30 VITALS — BP 126/74 | HR 76 | Ht 62.0 in | Wt 169.0 lb

## 2023-08-30 DIAGNOSIS — G20A1 Parkinson's disease without dyskinesia, without mention of fluctuations: Secondary | ICD-10-CM | POA: Diagnosis not present

## 2023-08-30 DIAGNOSIS — F02B0 Dementia in other diseases classified elsewhere, moderate, without behavioral disturbance, psychotic disturbance, mood disturbance, and anxiety: Secondary | ICD-10-CM | POA: Diagnosis not present

## 2023-08-30 DIAGNOSIS — G214 Vascular parkinsonism: Secondary | ICD-10-CM | POA: Diagnosis not present

## 2023-08-30 MED ORDER — CARBIDOPA-LEVODOPA ER 25-100 MG PO TBCR
EXTENDED_RELEASE_TABLET | ORAL | Status: DC
Start: 2023-08-30 — End: 2023-11-21

## 2023-09-11 DIAGNOSIS — H34831 Tributary (branch) retinal vein occlusion, right eye, with macular edema: Secondary | ICD-10-CM | POA: Diagnosis not present

## 2023-10-18 DIAGNOSIS — H34831 Tributary (branch) retinal vein occlusion, right eye, with macular edema: Secondary | ICD-10-CM | POA: Diagnosis not present

## 2023-10-18 DIAGNOSIS — H3582 Retinal ischemia: Secondary | ICD-10-CM | POA: Diagnosis not present

## 2023-10-18 DIAGNOSIS — H31001 Unspecified chorioretinal scars, right eye: Secondary | ICD-10-CM | POA: Diagnosis not present

## 2023-10-18 DIAGNOSIS — H35363 Drusen (degenerative) of macula, bilateral: Secondary | ICD-10-CM | POA: Diagnosis not present

## 2023-10-28 ENCOUNTER — Other Ambulatory Visit: Payer: Self-pay | Admitting: Family Medicine

## 2023-10-28 DIAGNOSIS — K219 Gastro-esophageal reflux disease without esophagitis: Secondary | ICD-10-CM

## 2023-11-10 DIAGNOSIS — H25012 Cortical age-related cataract, left eye: Secondary | ICD-10-CM | POA: Diagnosis not present

## 2023-11-10 DIAGNOSIS — H40033 Anatomical narrow angle, bilateral: Secondary | ICD-10-CM | POA: Diagnosis not present

## 2023-11-10 DIAGNOSIS — H2513 Age-related nuclear cataract, bilateral: Secondary | ICD-10-CM | POA: Diagnosis not present

## 2023-11-10 DIAGNOSIS — H5202 Hypermetropia, left eye: Secondary | ICD-10-CM | POA: Diagnosis not present

## 2023-11-18 ENCOUNTER — Other Ambulatory Visit: Payer: Self-pay | Admitting: Family Medicine

## 2023-11-18 DIAGNOSIS — K219 Gastro-esophageal reflux disease without esophagitis: Secondary | ICD-10-CM

## 2023-11-20 ENCOUNTER — Telehealth: Payer: Self-pay

## 2023-11-20 NOTE — Telephone Encounter (Signed)
 Covering for Dr. Suzanna Obey team, can you clarify with patient whether she is taking this medication? It is not on her current medication list  Thanks  Latrelle Dodrill, MD

## 2023-11-20 NOTE — Telephone Encounter (Signed)
 Called and spoke to patient's spouse.  Patient spouse stated that she is taking the pepcid 20mg  tablet.  Drusilla Kanner, CMA

## 2023-11-21 ENCOUNTER — Other Ambulatory Visit: Payer: Self-pay | Admitting: Neurology

## 2023-11-21 DIAGNOSIS — G214 Vascular parkinsonism: Secondary | ICD-10-CM

## 2023-11-28 ENCOUNTER — Other Ambulatory Visit: Payer: Self-pay

## 2023-11-28 ENCOUNTER — Emergency Department (HOSPITAL_COMMUNITY)

## 2023-11-28 ENCOUNTER — Inpatient Hospital Stay (HOSPITAL_COMMUNITY)
Admission: EM | Admit: 2023-11-28 | Discharge: 2023-12-08 | DRG: 871 | Disposition: A | Discharge status: Home Health | Attending: Internal Medicine | Admitting: Internal Medicine

## 2023-11-28 DIAGNOSIS — F0280 Dementia in other diseases classified elsewhere without behavioral disturbance: Secondary | ICD-10-CM | POA: Diagnosis present

## 2023-11-28 DIAGNOSIS — R404 Transient alteration of awareness: Secondary | ICD-10-CM | POA: Diagnosis not present

## 2023-11-28 DIAGNOSIS — K219 Gastro-esophageal reflux disease without esophagitis: Secondary | ICD-10-CM | POA: Diagnosis not present

## 2023-11-28 DIAGNOSIS — Z87891 Personal history of nicotine dependence: Secondary | ICD-10-CM

## 2023-11-28 DIAGNOSIS — Z1152 Encounter for screening for COVID-19: Secondary | ICD-10-CM

## 2023-11-28 DIAGNOSIS — G9341 Metabolic encephalopathy: Secondary | ICD-10-CM | POA: Diagnosis not present

## 2023-11-28 DIAGNOSIS — B964 Proteus (mirabilis) (morganii) as the cause of diseases classified elsewhere: Secondary | ICD-10-CM | POA: Diagnosis present

## 2023-11-28 DIAGNOSIS — N390 Urinary tract infection, site not specified: Secondary | ICD-10-CM | POA: Diagnosis not present

## 2023-11-28 DIAGNOSIS — Z7401 Bed confinement status: Secondary | ICD-10-CM | POA: Diagnosis not present

## 2023-11-28 DIAGNOSIS — D500 Iron deficiency anemia secondary to blood loss (chronic): Secondary | ICD-10-CM | POA: Diagnosis present

## 2023-11-28 DIAGNOSIS — Z79899 Other long term (current) drug therapy: Secondary | ICD-10-CM | POA: Diagnosis not present

## 2023-11-28 DIAGNOSIS — Z841 Family history of disorders of kidney and ureter: Secondary | ICD-10-CM

## 2023-11-28 DIAGNOSIS — R531 Weakness: Secondary | ICD-10-CM | POA: Diagnosis not present

## 2023-11-28 DIAGNOSIS — I10 Essential (primary) hypertension: Secondary | ICD-10-CM | POA: Diagnosis not present

## 2023-11-28 DIAGNOSIS — E119 Type 2 diabetes mellitus without complications: Secondary | ICD-10-CM

## 2023-11-28 DIAGNOSIS — A419 Sepsis, unspecified organism: Principal | ICD-10-CM | POA: Diagnosis present

## 2023-11-28 DIAGNOSIS — R1310 Dysphagia, unspecified: Secondary | ICD-10-CM | POA: Diagnosis present

## 2023-11-28 DIAGNOSIS — E785 Hyperlipidemia, unspecified: Secondary | ICD-10-CM | POA: Diagnosis not present

## 2023-11-28 DIAGNOSIS — I2510 Atherosclerotic heart disease of native coronary artery without angina pectoris: Secondary | ICD-10-CM | POA: Diagnosis not present

## 2023-11-28 DIAGNOSIS — Z6829 Body mass index (BMI) 29.0-29.9, adult: Secondary | ICD-10-CM

## 2023-11-28 DIAGNOSIS — Z79890 Hormone replacement therapy (postmenopausal): Secondary | ICD-10-CM | POA: Diagnosis not present

## 2023-11-28 DIAGNOSIS — I6523 Occlusion and stenosis of bilateral carotid arteries: Secondary | ICD-10-CM | POA: Diagnosis not present

## 2023-11-28 DIAGNOSIS — N3001 Acute cystitis with hematuria: Secondary | ICD-10-CM | POA: Diagnosis not present

## 2023-11-28 DIAGNOSIS — L89151 Pressure ulcer of sacral region, stage 1: Secondary | ICD-10-CM | POA: Diagnosis not present

## 2023-11-28 DIAGNOSIS — A499 Bacterial infection, unspecified: Secondary | ICD-10-CM | POA: Diagnosis present

## 2023-11-28 DIAGNOSIS — R319 Hematuria, unspecified: Secondary | ICD-10-CM | POA: Diagnosis not present

## 2023-11-28 DIAGNOSIS — R0689 Other abnormalities of breathing: Secondary | ICD-10-CM | POA: Diagnosis not present

## 2023-11-28 DIAGNOSIS — I480 Paroxysmal atrial fibrillation: Secondary | ICD-10-CM | POA: Diagnosis not present

## 2023-11-28 DIAGNOSIS — N1330 Unspecified hydronephrosis: Secondary | ICD-10-CM | POA: Diagnosis not present

## 2023-11-28 DIAGNOSIS — G214 Vascular parkinsonism: Secondary | ICD-10-CM | POA: Diagnosis not present

## 2023-11-28 DIAGNOSIS — B9620 Unspecified Escherichia coli [E. coli] as the cause of diseases classified elsewhere: Secondary | ICD-10-CM | POA: Diagnosis present

## 2023-11-28 DIAGNOSIS — R509 Fever, unspecified: Secondary | ICD-10-CM | POA: Diagnosis not present

## 2023-11-28 DIAGNOSIS — E43 Unspecified severe protein-calorie malnutrition: Secondary | ICD-10-CM | POA: Diagnosis present

## 2023-11-28 DIAGNOSIS — E039 Hypothyroidism, unspecified: Secondary | ICD-10-CM | POA: Diagnosis present

## 2023-11-28 DIAGNOSIS — Z743 Need for continuous supervision: Secondary | ICD-10-CM | POA: Diagnosis not present

## 2023-11-28 DIAGNOSIS — L89896 Pressure-induced deep tissue damage of other site: Secondary | ICD-10-CM | POA: Diagnosis not present

## 2023-11-28 DIAGNOSIS — G20A1 Parkinson's disease without dyskinesia, without mention of fluctuations: Secondary | ICD-10-CM | POA: Diagnosis present

## 2023-11-28 DIAGNOSIS — Z90710 Acquired absence of both cervix and uterus: Secondary | ICD-10-CM

## 2023-11-28 DIAGNOSIS — I959 Hypotension, unspecified: Secondary | ICD-10-CM | POA: Diagnosis not present

## 2023-11-28 DIAGNOSIS — L8990 Pressure ulcer of unspecified site, unspecified stage: Secondary | ICD-10-CM | POA: Insufficient documentation

## 2023-11-28 DIAGNOSIS — Z86711 Personal history of pulmonary embolism: Secondary | ICD-10-CM

## 2023-11-28 DIAGNOSIS — G9340 Encephalopathy, unspecified: Secondary | ICD-10-CM | POA: Diagnosis not present

## 2023-11-28 DIAGNOSIS — R5381 Other malaise: Secondary | ICD-10-CM | POA: Diagnosis present

## 2023-11-28 DIAGNOSIS — Z8619 Personal history of other infectious and parasitic diseases: Secondary | ICD-10-CM

## 2023-11-28 LAB — URINALYSIS, ROUTINE W REFLEX MICROSCOPIC
Bilirubin Urine: NEGATIVE
Glucose, UA: NEGATIVE mg/dL
Ketones, ur: 5 mg/dL — AB
Nitrite: NEGATIVE
Protein, ur: >=300 mg/dL — AB
RBC / HPF: >50 RBC/hpf (ref 0–5)
Specific Gravity, Urine: 1.014 (ref 1.005–1.030)
WBC, UA: >50 WBC/hpf (ref 0–5)
pH: 8 (ref 5.0–8.0)

## 2023-11-28 LAB — CBC WITH DIFFERENTIAL/PLATELET
Abs Immature Granulocytes: 0.08 10*3/uL — ABNORMAL HIGH (ref 0.00–0.07)
Basophils Absolute: 0 10*3/uL (ref 0.0–0.1)
Basophils Relative: 0 %
Eosinophils Absolute: 0 10*3/uL (ref 0.0–0.5)
Eosinophils Relative: 0 %
HCT: 27.6 % — ABNORMAL LOW (ref 36.0–46.0)
Hemoglobin: 9.2 g/dL — ABNORMAL LOW (ref 12.0–15.0)
Immature Granulocytes: 1 %
Lymphocytes Relative: 8 %
Lymphs Abs: 1 10*3/uL (ref 0.7–4.0)
MCH: 27.1 pg (ref 26.0–34.0)
MCHC: 33.3 g/dL (ref 30.0–36.0)
MCV: 81.2 fL (ref 80.0–100.0)
Monocytes Absolute: 1 10*3/uL (ref 0.1–1.0)
Monocytes Relative: 8 %
Neutro Abs: 9.9 10*3/uL — ABNORMAL HIGH (ref 1.7–7.7)
Neutrophils Relative %: 83 %
Platelets: 243 10*3/uL (ref 150–400)
RBC: 3.4 MIL/uL — ABNORMAL LOW (ref 3.87–5.11)
RDW: 14 % (ref 11.5–15.5)
WBC: 12 10*3/uL — ABNORMAL HIGH (ref 4.0–10.5)
nRBC: 0 % (ref 0.0–0.2)

## 2023-11-28 LAB — COMPREHENSIVE METABOLIC PANEL
ALT: 7 U/L (ref 0–44)
AST: 23 U/L (ref 15–41)
Albumin: 2.2 g/dL — ABNORMAL LOW (ref 3.5–5.0)
Alkaline Phosphatase: 74 U/L (ref 38–126)
Anion gap: 10 (ref 5–15)
BUN: 39 mg/dL — ABNORMAL HIGH (ref 8–23)
CO2: 18 mmol/L — ABNORMAL LOW (ref 22–32)
Calcium: 6.5 mg/dL — ABNORMAL LOW (ref 8.9–10.3)
Chloride: 115 mmol/L — ABNORMAL HIGH (ref 98–111)
Creatinine, Ser: 0.71 mg/dL (ref 0.44–1.00)
GFR, Estimated: >60 mL/min (ref >60)
Glucose, Bld: 96 mg/dL (ref 70–99)
Potassium: 3.6 mmol/L (ref 3.5–5.1)
Sodium: 143 mmol/L (ref 135–145)
Total Bilirubin: 0.9 mg/dL (ref 0.0–1.2)
Total Protein: 5.5 g/dL — ABNORMAL LOW (ref 6.5–8.1)

## 2023-11-28 LAB — RESP PANEL BY RT-PCR (RSV, FLU A&B, COVID)  RVPGX2
Influenza A by PCR: NEGATIVE
Influenza B by PCR: NEGATIVE
Resp Syncytial Virus by PCR: NEGATIVE
SARS Coronavirus 2 by RT PCR: NEGATIVE

## 2023-11-28 LAB — TROPONIN I (HIGH SENSITIVITY)
Troponin I (High Sensitivity): 12 ng/L (ref <18)
Troponin I (High Sensitivity): 12 ng/L (ref <18)

## 2023-11-28 LAB — CBG MONITORING, ED: Glucose-Capillary: 105 mg/dL — ABNORMAL HIGH (ref 70–99)

## 2023-11-28 LAB — I-STAT CG4 LACTIC ACID, ED
Lactic Acid, Venous: 0.7 mmol/L (ref 0.5–1.9)
Lactic Acid, Venous: 0.8 mmol/L (ref 0.5–1.9)

## 2023-11-28 LAB — GLUCOSE, CAPILLARY: Glucose-Capillary: 76 mg/dL (ref 70–99)

## 2023-11-28 MED ORDER — CARBIDOPA-LEVODOPA ER 25-100 MG PO TBCR
2.0000 | EXTENDED_RELEASE_TABLET | Freq: Three times a day (TID) | ORAL | Status: DC
Start: 2023-11-29 — End: 2023-12-08
  Administered 2023-11-29 – 2023-12-08 (×29): 2 via ORAL
  Filled 2023-11-28 (×31): qty 2

## 2023-11-28 MED ORDER — METOPROLOL TARTRATE 12.5 MG HALF TABLET
12.5000 mg | ORAL_TABLET | Freq: Two times a day (BID) | ORAL | Status: DC
  Administered 2023-11-28 – 2023-12-08 (×19): 12.5 mg via ORAL
  Filled 2023-11-28 (×21): qty 1

## 2023-11-28 MED ORDER — CALCIUM GLUCONATE-NACL 1-0.675 GM/50ML-% IV SOLN
1.0000 g | Freq: Once | INTRAVENOUS | Status: AC
  Administered 2023-11-28: 1000 mg via INTRAVENOUS
  Filled 2023-11-28: qty 50

## 2023-11-28 MED ORDER — ACETAMINOPHEN 650 MG RE SUPP
650.0000 mg | Freq: Once | RECTAL | Status: AC
  Administered 2023-11-28: 650 mg via RECTAL
  Filled 2023-11-28: qty 1

## 2023-11-28 MED ORDER — LEVOTHYROXINE SODIUM 100 MCG PO TABS
100.0000 ug | ORAL_TABLET | Freq: Every day | ORAL | Status: DC
  Administered 2023-11-29 – 2023-12-08 (×10): 100 ug via ORAL
  Filled 2023-11-28 (×10): qty 1

## 2023-11-28 MED ORDER — SODIUM CHLORIDE 0.9 % IV BOLUS
1000.0000 mL | Freq: Once | INTRAVENOUS | Status: AC
  Administered 2023-11-28: 1000 mL via INTRAVENOUS

## 2023-11-28 MED ORDER — ROSUVASTATIN CALCIUM 10 MG PO TABS
10.0000 mg | ORAL_TABLET | Freq: Every evening | ORAL | Status: DC
  Administered 2023-11-28 – 2023-12-07 (×10): 10 mg via ORAL
  Filled 2023-11-28 (×10): qty 1

## 2023-11-28 MED ORDER — MELATONIN 3 MG PO TABS
3.0000 mg | ORAL_TABLET | Freq: Every evening | ORAL | Status: DC | PRN
  Administered 2023-12-01 – 2023-12-07 (×4): 3 mg via ORAL
  Filled 2023-11-28 (×4): qty 1

## 2023-11-28 MED ORDER — SENNOSIDES-DOCUSATE SODIUM 8.6-50 MG PO TABS
1.0000 | ORAL_TABLET | Freq: Every day | ORAL | Status: DC
  Administered 2023-11-28 – 2023-12-07 (×10): 1 via ORAL
  Filled 2023-11-28 (×10): qty 1

## 2023-11-28 MED ORDER — SODIUM CHLORIDE 0.9 % IV SOLN
1.0000 g | Freq: Three times a day (TID) | INTRAVENOUS | Status: AC
  Administered 2023-11-28 – 2023-12-07 (×29): 1 g via INTRAVENOUS
  Filled 2023-11-28 (×30): qty 20

## 2023-11-28 MED ORDER — PANTOPRAZOLE SODIUM 40 MG PO TBEC: 40.0000 mg | DELAYED_RELEASE_TABLET | Freq: Every day | ORAL | Status: DC | PRN

## 2023-11-28 MED ORDER — SODIUM CHLORIDE 0.9 % IV SOLN
2.0000 g | Freq: Once | INTRAVENOUS | Status: AC
  Administered 2023-11-28: 2 g via INTRAVENOUS
  Filled 2023-11-28: qty 20

## 2023-11-28 MED ORDER — ONDANSETRON HCL 4 MG PO TABS: 4.0000 mg | ORAL_TABLET | Freq: Four times a day (QID) | ORAL | Status: DC | PRN

## 2023-11-28 MED ORDER — CARBIDOPA-LEVODOPA ER 25-100 MG PO TBCR
1.0000 | EXTENDED_RELEASE_TABLET | Freq: Every day | ORAL | Status: DC
  Administered 2023-11-28 – 2023-12-07 (×10): 1 via ORAL
  Filled 2023-11-28 (×12): qty 1

## 2023-11-28 MED ORDER — ACETAMINOPHEN 325 MG PO TABS
650.0000 mg | ORAL_TABLET | Freq: Four times a day (QID) | ORAL | Status: DC | PRN
  Administered 2023-11-29 – 2023-12-04 (×3): 650 mg via ORAL
  Filled 2023-11-28 (×3): qty 2

## 2023-11-28 MED ORDER — DULOXETINE HCL 20 MG PO CPEP
20.0000 mg | ORAL_CAPSULE | Freq: Every morning | ORAL | Status: DC
  Administered 2023-11-30 – 2023-12-08 (×9): 20 mg via ORAL
  Filled 2023-11-28 (×11): qty 1

## 2023-11-28 MED ORDER — ONDANSETRON HCL 4 MG/2ML IJ SOLN
4.0000 mg | Freq: Four times a day (QID) | INTRAMUSCULAR | Status: DC | PRN
Start: 2023-11-28 — End: 2023-12-08

## 2023-11-28 MED ORDER — ACETAMINOPHEN 650 MG RE SUPP: 650.0000 mg | Freq: Four times a day (QID) | RECTAL | Status: DC | PRN

## 2023-11-28 NOTE — Sepsis Progress Note (Signed)
 Elink is following code sepsis.

## 2023-11-28 NOTE — H&P (Signed)
 History and Physical    Patient: Anna Bernard UJW:[id number] DOB: Dec 29, 1950 DOA: 11/28/2023 DOS: the patient was seen and examined on 11/28/2023 PCP: Billey Co, MD  Patient coming from: Home  Chief Complaint:  Chief Complaint  Patient presents with   Altered Mental Status   HPI: Anna Bernard is a 73 y.o. female with medical history significant of anemia, carotid artery disease, hyperlipidemia, hypertension, hypothyroidism, mild CAD, paroxysmal atrial fibrillation, prediabetes, pulmonary emboli, right subclavian artery, vitamin B12 deficiency who was brought to the emergency department via EMS after they were called by her daughter stating that the patient has decreased mentation with increased somnolence, decreased oral intake and inability to help with transfers.  She is unable to provide further information from now and only follows simple commands.  Lab work: Urinalysis with Sorbid with small hemoglobin and small leukocyte esterase.  There were ketones of 5 and protein greater than 300 mg/dL.  Microscopic examination showed greater than 50 WBC, greater than 50 RBC, many bacteria and positive WBC clumps.  CBC showed a white count 12.0, hemoglobin 9.2 g/dL platelets 562.  Coronavirus, influenza and RSV PCR was negative.  CMP showed a chloride of 115 and CO2 of 18 mmol/L with a normal anion gap.  Corrected calcium was 7.9, glucose 96, BUN 39 and creatinine 0.71 mg/dL.  Total protein 5.5 and albumin 2.2 g deciliter, the rest of the LFTs were normal.  Lactic acid x 2 and troponin x 2 were normal.  Coronavirus, influenza and RSV PCR test was negative.  Imaging: Portable 1 view chest radiograph with no active disease.   ED course: Initial vital signs were temperature 99.4 F, then increased to 100.5 F, pulse 86, respiration 23, O2 sat 95% on room air and BP 103/62 mmHg.  The patient received 2000 mL of normal saline bolus, ceftriaxone 2 g IVPB, calcium gluconate 1 g IVPB and acetaminophen  650 mg suppository PR x 1.  I started the patient on meropenem.  Review of Systems: As mentioned in the history of present illness. All other systems reviewed and are negative.  Past Medical History:  Diagnosis Date   Anemia, unspecified    Carotid artery disease (HCC)    Hyperlipidemia, unspecified    Hypertension    Hypothyroidism, unspecified    Mild CAD    PAF (paroxysmal atrial fibrillation) (HCC)    Pre-diabetes    Pulmonary emboli (HCC)    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 by duplex 2023   Vitamin B12 deficiency    Past Surgical History:  Procedure Laterality Date   ABDOMINAL HYSTERECTOMY     BIOPSY  01/13/2023   Procedure: BIOPSY;  Surgeon: Jeani Hawking, MD;  Location: [facility] ENDOSCOPY;  Service: Gastroenterology;;   COLONOSCOPY WITH PROPOFOL N/A 01/15/2023   Procedure: COLONOSCOPY WITH PROPOFOL;  Surgeon: Tressia Danas, MD;  Location: [facility] ENDOSCOPY;  Service: Gastroenterology;  Laterality: N/A;   ENTEROSCOPY N/A 11/18/2022   Procedure: ENTEROSCOPY;  Surgeon: Jeani Hawking, MD;  Location: [facility] ENDOSCOPY;  Service: Gastroenterology;  Laterality: N/A;   ENTEROSCOPY N/A 01/13/2023   Procedure: ENTEROSCOPY;  Surgeon: Jeani Hawking, MD;  Location: [facility] ENDOSCOPY;  Service: Gastroenterology;  Laterality: N/A;   GIVENS CAPSULE STUDY N/A 04/16/2018   Procedure: GIVENS CAPSULE STUDY;  Surgeon: Jeani Hawking, MD;  Location: [facility] ENDOSCOPY;  Service: Endoscopy;  Laterality: N/A;   GIVENS CAPSULE STUDY  01/15/2023   Procedure: GIVENS CAPSULE STUDY;  Surgeon: Tressia Danas, MD;  Location: [facility] ENDOSCOPY;  Service: Gastroenterology;;   HOT HEMOSTASIS N/A 11/18/2022   Procedure: HOT HEMOSTASIS (ARGON PLASMA COAGULATION/BICAP);  Surgeon: Jeani Hawking, MD;  Location: [facility] ENDOSCOPY;  Service: Gastroenterology;  Laterality: N/A;   PARS PLANA VITRECTOMY Right 08/16/2020   Procedure: 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;  Surgeon: Carmela Rima,  MD;  Location: [facility] OR;  Service: Ophthalmology;  Laterality: Right;   PARS PLANA VITRECTOMY Right 09/29/2020   Procedure: PARS PLANA VITRECTOMY WITH 25 GAUGE - ENDOCAUTRY;  Surgeon: Carmela Rima, MD;  Location: [facility] OR;  Service: Ophthalmology;  Laterality: Right;   PHOTOCOAGULATION WITH LASER Right 08/16/2020   Procedure: PHOTOCOAGULATION WITH LASER;  Surgeon: Carmela Rima, MD;  Location: [facility] OR;  Service: Ophthalmology;  Laterality: Right;   PHOTOCOAGULATION WITH LASER Right 09/29/2020   Procedure: PHOTOCOAGULATION WITH LASER;  Surgeon: Carmela Rima, MD;  Location: [facility] OR;  Service: Ophthalmology;  Laterality: Right;   POLYPECTOMY  01/15/2023   Procedure: POLYPECTOMY;  Surgeon: Tressia Danas, MD;  Location: [facility] ENDOSCOPY;  Service: Gastroenterology;;   SHOULDER SURGERY     TONSILLECTOMY     TUBAL LIGATION     Social History:  reports that she has quit smoking. She has been exposed to tobacco smoke. She has never used smokeless tobacco. She reports that she does not drink alcohol and does not use drugs.  No Known Allergies  Family History  Problem Relation Age of Onset   Renal Disease Sister    Cancer Sister     Prior to Admission medications   Medication Sig Start Date End Date Taking? Authorizing Provider  Carbidopa-Levodopa ER (SINEMET CR) 25-100 MG tablet controlled release TAKE 3 TABLETS AT 7:00AM, 2 TABLETS AT 11:00AM, AND 2 TABLETS AT 4:00PM DAILY 11/21/23   Tat, Octaviano Batty, DO  diclofenac Sodium (VOLTAREN) 1 % GEL APPLY 4 GRAMS TOPICALLY 4 TIMES A DAY 07/27/23   Billey Co, MD  famotidine (PEPCID) 20 MG tablet Take 1 tablet (20 mg total) by mouth daily as needed for heartburn or indigestion. 11/20/23   Latrelle Dodrill, MD  levothyroxine (SYNTHROID) 100 MCG tablet TAKE 1 TABLET BY MOUTH EVERY MORNING 30 MINUTES BEFORE FOOD 07/27/23   Billey Co, MD  melatonin 3 MG TABS tablet Take 3 mg by mouth at bedtime as needed (For sleep). 01/24/22   [provider]   metoprolol tartrate (LOPRESSOR) 25 MG tablet TAKE 1/2 TABLET TWICE A DAY BY MOUTH 08/28/23   Billey Co, MD  Multiple Vitamin (MULTIVITAMIN WITH MINERALS) TABS Take 1 tablet by mouth daily with breakfast.    [provider]  pantoprazole (PROTONIX) 40 MG tablet NEW PRESCRIPTION REQUEST: TAKE ONE TABLET BY MOUTH TWICE DAILY Patient taking differently: Take 40 mg by mouth 2 (two) times daily. 12/22/22   Billey Co, MD  rosuvastatin (CRESTOR) 10 MG tablet Take 1 tablet (10 mg total) by mouth every evening. 03/14/23 03/13/24  Quintella Reichert, MD  senna-docusate (SENOKOT-S) 8.6-50 MG tablet Take 1 tablet by mouth at bedtime. Hold if diarrhea 11/20/22   Albertine Grates, MD  TYLENOL 8 HOUR ARTHRITIS PAIN 650 MG CR tablet Take 650-1,300 mg by mouth every 8 (eight) hours as needed for pain.    [provider]    Physical Exam: Vitals:   11/28/23 0940 11/28/23 0944 11/28/23 0958 11/28/23 1130  BP: 103/62   (!) 152/67  Pulse: 86   90  Resp:  (!) 23  15  Temp: 99.4 F (37.4 C)  (!) 100.5  F (38.1 C)   TempSrc: Oral  Rectal   SpO2: 95%   100%   Physical Exam Vitals and nursing note reviewed.  Constitutional:      General: She is not in acute distress.    Appearance: She is obese. She is ill-appearing.  HENT:     Head: Normocephalic.     Nose: No rhinorrhea.     Mouth/Throat:     Mouth: Mucous membranes are dry.     Pharynx: No posterior oropharyngeal erythema.  Eyes:     General: No scleral icterus.    Pupils: Pupils are equal, round, and reactive to light.  Neck:     Vascular: No JVD.  Cardiovascular:     Rate and Rhythm: Normal rate and regular rhythm.     Heart sounds: S1 normal and S2 normal.  Pulmonary:     Effort: Pulmonary effort is normal.     Breath sounds: Normal breath sounds. No wheezing, rhonchi or rales.  Abdominal:     General: Bowel sounds are normal. There is no distension.     Palpations: Abdomen is soft.     Tenderness: There is no abdominal  tenderness. There is no guarding.  Musculoskeletal:     Cervical back: Neck supple.     Right lower leg: No edema.     Left lower leg: No edema.  Skin:    General: Skin is warm and dry.  Neurological:     General: No focal deficit present.     Mental Status: She is oriented to person, place, and time and easily aroused. She is lethargic.  Psychiatric:        Mood and Affect: Mood normal.        Behavior: Behavior normal. Behavior is cooperative.     Data Reviewed:  Results are pending, will review when available.  Assessment and Plan: Principal Problem:   Acute metabolic encephalopathy  In the setting of:   Acute UTI (urinary tract infection) Admit to PCU/inpatient. Continue IV fluids. Begin meropenem 1 g IVPB every 8 hours.   Follow-up urine culture and sensitivity. Follow-up blood culture and sensitivity Follow CBC and CMP in a.m.  Active Problems:   Iron deficiency anemia due to chronic blood loss Monitor hematocrit and hemoglobin.    Paroxysmal atrial fibrillation (HCC) CHA?DS?-VASc Score Continue metoprolol for rate control. She is not on anticoagulation.    Essential hypertension Continue metoprolol 12.5 mg p.o. twice daily.    Diabetes mellitus type 2, controlled, without complications (HCC) Carbohydrate modified diet. CBG monitoring before meals and bedtime. If needed, add-on RI SS. Check hemoglobin A1c.    Hypothyroidism Continue levothyroxine 100 mcg p.o. daily.    Gastroesophageal reflux disease Continue pantoprazole 40 mg p.o. daily.    Vascular parkinsonism (HCC) Continue Sinemet p.o. 3 times daily per home dosing.    Protein-calorie malnutrition, severe (HCC) In the setting of anemia and poor nutrition. May benefit from protein supplementation. Consider nutritional services evaluation. Follow-up albumin level.     Advance Care Planning:   Code Status: Full Code   Consults:   Family Communication: Her daughter was at bedside and  provided history.  Severity of Illness: The appropriate patient status for this patient is INPATIENT. Inpatient status is judged to be reasonable and necessary in order to provide the required intensity of service to ensure the patient's safety. The patient's presenting symptoms, physical exam findings, and initial radiographic and laboratory data in the context of their chronic comorbidities is felt to  place them at high risk for further clinical deterioration. Furthermore, it is not anticipated that the patient will be medically stable for discharge from the hospital within 2 midnights of admission.   * I certify that at the point of admission it is my clinical judgment that the patient will require inpatient hospital care spanning beyond 2 midnights from the point of admission due to high intensity of service, high risk for further deterioration and high frequency of surveillance required.*  Author: Bobette Mo, MD 11/28/2023 12:44 PM  For on call review [email or web address].   This document was prepared using Dragon voice recognition software and may contain some unintended transcription errors.

## 2023-11-28 NOTE — Progress Notes (Deleted)
 Unable to do admission questions d/t confusion.

## 2023-11-28 NOTE — ED Notes (Signed)
Hospitalist to bedside.

## 2023-11-28 NOTE — ED Triage Notes (Signed)
 Pt BIBA from home for lethargy and altered mental status. Denies any pain. Family reports decreased urinary output, urine is dark. 20G Left Forearm BP 110/64 P 86 RR 24 RA 96% CBG 142

## 2023-11-28 NOTE — ED Provider Notes (Signed)
 [place] [facility] AT [facility] Provider Note   CSN: [id number] Arrival date & time: 11/28/23  0920     History  Chief Complaint  Patient presents with   Altered Mental Status    Anna Bernard is a 73 y.o. female.  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  Pt presents to the ED today with AMS and weakness.  Pt is unable to provide any hx.  Pt denies any pain.       Home Medications Prior to Admission medications   Medication Sig Start Date End Date Taking? Authorizing Provider  DULoxetine (CYMBALTA) 20 MG capsule Take 20 mg by mouth every morning. 09/24/23  Yes [provider]  trimethoprim (TRIMPEX) 100 MG tablet Take 100 mg by mouth daily. 11/20/23  Yes [provider]  Carbidopa-Levodopa ER (SINEMET CR) 25-100 MG tablet controlled release TAKE 3 TABLETS AT 7:00AM, 2 TABLETS AT 11:00AM, AND 2 TABLETS AT 4:00PM DAILY 11/21/23   Tat, Octaviano Batty, DO  diclofenac Sodium (VOLTAREN) 1 % GEL APPLY 4 GRAMS TOPICALLY 4 TIMES A DAY 07/27/23   Billey Co, MD  famotidine (PEPCID) 20 MG tablet Take 1 tablet (20 mg total) by mouth daily as needed for heartburn or indigestion. 11/20/23   Latrelle Dodrill, MD  levothyroxine (SYNTHROID) 100 MCG tablet TAKE 1 TABLET BY MOUTH EVERY MORNING 30 MINUTES BEFORE FOOD 07/27/23   Billey Co, MD  melatonin 3 MG TABS tablet Take 3 mg by mouth at bedtime as needed (For sleep). 01/24/22   [provider]  metoprolol tartrate (LOPRESSOR) 25 MG tablet TAKE 1/2 TABLET TWICE A DAY BY MOUTH 08/28/23   Billey Co, MD  Multiple Vitamin (MULTIVITAMIN WITH MINERALS) TABS Take 1 tablet by mouth daily with breakfast.    [provider]  pantoprazole (PROTONIX) 40 MG tablet NEW PRESCRIPTION REQUEST: TAKE ONE TABLET BY MOUTH TWICE DAILY Patient taking differently: Take 40  mg by mouth 2 (two) times daily. 12/22/22   Billey Co, MD  rosuvastatin (CRESTOR) 10 MG tablet Take 1 tablet (10 mg total) by mouth every evening. 03/14/23 03/13/24  Quintella Reichert, MD  senna-docusate (SENOKOT-S) 8.6-50 MG tablet Take 1 tablet by mouth at bedtime. Hold if diarrhea 11/20/22   Albertine Grates, MD  TYLENOL 8 HOUR ARTHRITIS PAIN 650 MG CR tablet Take 650-1,300 mg by mouth every 8 (eight) hours as needed for pain.    [provider]      Allergies    Patient has no known allergies.    Review of Systems   Review of Systems  Unable to perform ROS: Dementia  All other systems reviewed and are negative.   Physical Exam Updated Vital Signs BP 138/68   Pulse 85   Temp (!) 100.5 F (38.1 C) (Rectal)   Resp 18   SpO2 98%  Physical Exam Vitals and nursing note reviewed.  Constitutional:      Appearance: She is ill-appearing.  HENT:     Head: Normocephalic and atraumatic.     Right Ear: External ear normal.     Left Ear: External ear normal.     Nose: Nose normal.     Mouth/Throat:     Mouth: Mucous membranes are dry.  Eyes:     Extraocular Movements: Extraocular movements intact.     Conjunctiva/sclera: Conjunctivae normal.  Pupils: Pupils are equal, round, and reactive to light.  Cardiovascular:     Rate and Rhythm: Normal rate and regular rhythm.     Pulses: Normal pulses.     Heart sounds: Normal heart sounds.  Pulmonary:     Effort: Pulmonary effort is normal.     Breath sounds: Normal breath sounds.  Abdominal:     General: Abdomen is flat. Bowel sounds are normal.     Palpations: Abdomen is soft.  Musculoskeletal:        General: Normal range of motion.     Cervical back: Normal range of motion and neck supple.  Skin:    General: Skin is warm.     Capillary Refill: Capillary refill takes less than 2 seconds.  Neurological:     Mental Status: She is disoriented.  Psychiatric:        Mood and Affect: Mood normal.     ED Results / Procedures /  Treatments   Labs (all labs ordered are listed, but only abnormal results are displayed) Labs Reviewed  CBC WITH DIFFERENTIAL/PLATELET - Abnormal; Notable for the following components:      Result Value   WBC 12.0 (*)    RBC 3.40 (*)    Hemoglobin 9.2 (*)    HCT 27.6 (*)    Neutro Abs 9.9 (*)    Abs Immature Granulocytes 0.08 (*)    All other components within normal limits  COMPREHENSIVE METABOLIC PANEL - Abnormal; Notable for the following components:   Chloride 115 (*)    CO2 18 (*)    BUN 39 (*)    Calcium 6.5 (*)    Total Protein 5.5 (*)    Albumin 2.2 (*)    All other components within normal limits  URINALYSIS, ROUTINE W REFLEX MICROSCOPIC - Abnormal; Notable for the following components:   APPearance TURBID (*)    Hgb urine dipstick SMALL (*)    Ketones, ur 5 (*)    Protein, ur >=300 (*)    Leukocytes,Ua SMALL (*)    Bacteria, UA MANY (*)    All other components within normal limits  CBG MONITORING, ED - Abnormal; Notable for the following components:   Glucose-Capillary 105 (*)    All other components within normal limits  RESP PANEL BY RT-PCR (RSV, FLU A&B, COVID)  RVPGX2  CULTURE, BLOOD (ROUTINE X 2)  CULTURE, BLOOD (ROUTINE X 2)  I-STAT CG4 LACTIC ACID, ED  I-STAT CG4 LACTIC ACID, ED  TROPONIN I (HIGH SENSITIVITY)  TROPONIN I (HIGH SENSITIVITY)    EKG EKG Interpretation Date/Time:  Tuesday November 28 2023 09:41:58 EDT Ventricular Rate:  85 PR Interval:  166 QRS Duration:  89 QT Interval:  361 QTC Calculation: 430 R Axis:   59  Text Interpretation: Sinus rhythm Abnormal R-wave progression, early transition No significant change since last tracing Confirmed by Jacalyn Lefevre [phone]) on 11/28/2023 10:29:42 AM  Radiology No results found.  Procedures Procedures    Medications Ordered in ED Medications  calcium gluconate 1 g/ 50 mL sodium chloride IVPB (has no administration in time range)  sodium chloride 0.9 % bolus 1,000 mL (0 mLs Intravenous  Stopped 11/28/23 1207)  cefTRIAXone (ROCEPHIN) 2 g in sodium chloride 0.9 % 100 mL IVPB (0 g Intravenous Stopped 11/28/23 1207)  acetaminophen (TYLENOL) suppository 650 mg (650 mg Rectal Given 11/28/23 1050)  sodium chloride 0.9 % bolus 1,000 mL (0 mLs Intravenous Stopped 11/28/23 1207)    ED Course/ Medical Decision Making/ A&P  Medical Decision Making Amount and/or Complexity of Data Reviewed Labs: ordered. Radiology: ordered.  Risk OTC drugs. Prescription drug management. Decision regarding hospitalization.   This patient presents to the ED for concern of ams, this involves an extensive number of treatment options, and is a complaint that carries with it a high risk of complications and morbidity.  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  The pertinent results include:  cath urine + for uti (>50 rbcs, >50 wbcs, many bacteria); cbc with wbc elevated at 12 and hgb low at 9.2 (12.5 in Nov, but usually runs in the 9-10 range); lactic nl; cmp with CO2 low at 18; bun elevated at 39, ca low at 6.5; trop nl   Imaging Studies ordered:  I ordered imaging studies including cxr  I independently visualized and interpreted imaging which showed nothing acute I agree with the radiologist interpretation   Cardiac Monitoring:  The patient was maintained on a cardiac monitor.  I personally viewed and interpreted the cardiac monitored which showed an underlying rhythm of: nsr   Medicines ordered and  prescription drug management:  I ordered medication including ivfs/rocephin/tylenol  for sx  Reevaluation of the patient after these medicines showed that the patient improved I have reviewed the patients home medicines and have made adjustments as needed   Critical Interventions:  Abx, fluids   Consultations Obtained:  I requested consultation with the hospitalist (Dr. Robb Matar),  and discussed lab and imaging findings as well as pertinent plan - he will admit   Problem List / ED Course:  Sepsis/UTI:  code sepsis called.  Pt started on rocephin and is given fluids.  She is not in septic shock and lactic nl, so only 2L NS given.  Sepsis is from uti.  BP has improved with ivfs. Hypocalcemia:  IV Calcium given.   Reevaluation:  After the interventions noted above, I reevaluated the patient and found that they have :improved   Social Determinants of Health:  Lives at home   Dispostion:  After consideration of the diagnostic results and the patients response to treatment, I feel that the patent would benefit from admission.  CRITICAL CARE Performed by: Jacalyn Lefevre   Total critical care time: 30 minutes  Critical care time was exclusive of separately billable procedures and treating other patients.  Critical care was necessary to treat or prevent imminent or life-threatening deterioration.  Critical care was time spent personally by me on the following activities: development of treatment plan with patient and/or surrogate as well as nursing, discussions with consultants, evaluation of patient's response to treatment, examination of patient, obtaining history from patient or surrogate, ordering and performing treatments and interventions, ordering and review of laboratory studies, ordering and review of radiographic studies, pulse oximetry and re-evaluation of patient's condition.           Final Clinical Impression(s) / ED Diagnoses Final diagnoses:  Sepsis with  encephalopathy without septic shock, due to unspecified organism [facility])  Acute cystitis with hematuria  Hypocalcemia    Rx / DC Orders ED Discharge Orders     None  Jacalyn Lefevre, MD 11/28/23 1309

## 2023-11-28 NOTE — Progress Notes (Signed)
 Unable to do admission questions d/t confusion. No family at bedside.

## 2023-11-29 ENCOUNTER — Inpatient Hospital Stay (HOSPITAL_COMMUNITY)

## 2023-11-29 ENCOUNTER — Encounter (HOSPITAL_COMMUNITY): Payer: Self-pay | Admitting: Internal Medicine

## 2023-11-29 ENCOUNTER — Other Ambulatory Visit: Payer: Self-pay

## 2023-11-29 DIAGNOSIS — N390 Urinary tract infection, site not specified: Secondary | ICD-10-CM | POA: Diagnosis not present

## 2023-11-29 LAB — COMPREHENSIVE METABOLIC PANEL
ALT: 7 U/L (ref 0–44)
AST: 29 U/L (ref 15–41)
Albumin: 2.5 g/dL — ABNORMAL LOW (ref 3.5–5.0)
Alkaline Phosphatase: 87 U/L (ref 38–126)
Anion gap: 13 (ref 5–15)
BUN: 33 mg/dL — ABNORMAL HIGH (ref 8–23)
CO2: 20 mmol/L — ABNORMAL LOW (ref 22–32)
Calcium: 8.4 mg/dL — ABNORMAL LOW (ref 8.9–10.3)
Chloride: 111 mmol/L (ref 98–111)
Creatinine, Ser: 0.87 mg/dL (ref 0.44–1.00)
GFR, Estimated: >60 mL/min (ref >60)
Glucose, Bld: 79 mg/dL (ref 70–99)
Potassium: 4.4 mmol/L (ref 3.5–5.1)
Sodium: 144 mmol/L (ref 135–145)
Total Bilirubin: 0.9 mg/dL (ref 0.0–1.2)
Total Protein: 6.4 g/dL — ABNORMAL LOW (ref 6.5–8.1)

## 2023-11-29 LAB — RETICULOCYTES
Immature Retic Fract: 3.2 % (ref 2.3–15.9)
RBC.: 3.97 MIL/uL (ref 3.87–5.11)
Retic Count, Absolute: 22.2 10*3/uL (ref 19.0–186.0)
Retic Ct Pct: 0.6 % (ref 0.4–3.1)

## 2023-11-29 LAB — CBC
HCT: 33.8 % — ABNORMAL LOW (ref 36.0–46.0)
Hemoglobin: 11 g/dL — ABNORMAL LOW (ref 12.0–15.0)
MCH: 27 pg (ref 26.0–34.0)
MCHC: 32.5 g/dL (ref 30.0–36.0)
MCV: 83 fL (ref 80.0–100.0)
Platelets: 278 10*3/uL (ref 150–400)
RBC: 4.07 MIL/uL (ref 3.87–5.11)
RDW: 14.5 % (ref 11.5–15.5)
WBC: 13.9 10*3/uL — ABNORMAL HIGH (ref 4.0–10.5)
nRBC: 0 % (ref 0.0–0.2)

## 2023-11-29 LAB — GLUCOSE, CAPILLARY
Glucose-Capillary: 66 mg/dL — ABNORMAL LOW (ref 70–99)
Glucose-Capillary: 111 mg/dL — ABNORMAL HIGH (ref 70–99)
Glucose-Capillary: 116 mg/dL — ABNORMAL HIGH (ref 70–99)
Glucose-Capillary: 161 mg/dL — ABNORMAL HIGH (ref 70–99)
Glucose-Capillary: 184 mg/dL — ABNORMAL HIGH (ref 70–99)

## 2023-11-29 LAB — IRON AND TIBC
Iron: 20 ug/dL — ABNORMAL LOW (ref 28–170)
Saturation Ratios: 11 % (ref 10.4–31.8)
TIBC: 176 ug/dL — ABNORMAL LOW (ref 250–450)
UIBC: 156 ug/dL

## 2023-11-29 LAB — HEMOGLOBIN A1C
Hgb A1c MFr Bld: 5 % (ref 4.8–5.6)
Mean Plasma Glucose: 96.8 mg/dL

## 2023-11-29 LAB — FERRITIN: Ferritin: 258 ng/mL (ref 11–307)

## 2023-11-29 LAB — FOLATE: Folate: 29.3 ng/mL (ref >5.9)

## 2023-11-29 LAB — VITAMIN B12: Vitamin B-12: 565 pg/mL (ref 180–914)

## 2023-11-29 MED ORDER — DEXTROSE 50 % IV SOLN
12.5000 g | INTRAVENOUS | Status: AC
  Administered 2023-11-29: 12.5 g via INTRAVENOUS
  Filled 2023-11-29: qty 50

## 2023-11-29 MED ORDER — ENSURE ENLIVE PO LIQD
237.0000 mL | Freq: Two times a day (BID) | ORAL | Status: DC
  Administered 2023-11-29 – 2023-12-08 (×16): 237 mL via ORAL

## 2023-11-29 MED ORDER — LACTATED RINGERS IV SOLN
INTRAVENOUS | Status: DC
Start: 2023-11-29 — End: 2023-11-29

## 2023-11-29 MED ORDER — DEXTROSE-SODIUM CHLORIDE 5-0.45 % IV SOLN: INTRAVENOUS | Status: DC

## 2023-11-29 MED ORDER — ORAL CARE MOUTH RINSE: 15.0000 mL | OROMUCOSAL | Status: DC | PRN

## 2023-11-29 NOTE — Progress Notes (Signed)
 Hypoglycemic Event  CBG: 66  Treatment: D50 25 mL (12.5 gm)  Symptoms: None  Follow-up CBG: Time:0830 CBG Result:111  Possible Reasons for Event: Inadequate meal intake  Comments/MD notified: MD made aware on rounds face to face, IVF changed.    Anna Bernard

## 2023-11-29 NOTE — Plan of Care (Signed)

## 2023-11-29 NOTE — Progress Notes (Signed)
 PT Cancellation Note  Patient Details Name: Anna Bernard MRN: [id number] DOB: May 06, 1951   Cancelled Treatment:    Reason Eval/Treat Not Completed: Patient at procedure or test/unavailable 1741. PT arrived earlier in the day 1055 and SLP in room. PT to continue to follow acutely.   Johnny Bridge, PT Acute Rehab   Jacqualyn Posey 11/29/2023, 5:45 PM

## 2023-11-29 NOTE — Progress Notes (Signed)
 Clinical/Bedside Swallow Evaluation Patient Details  Name: Anna Bernard MRN: [id number] Date of Birth: [birth year]  Today's Date: 11/29/2023 Time: SLP Start Time (ACUTE ONLY): 1043 SLP Stop Time (ACUTE ONLY): 1115 SLP Time Calculation (min) (ACUTE ONLY): 32 min  Past Medical History:  Past Medical History:  Diagnosis Date   Anemia, unspecified    Carotid artery disease (HCC)    Hyperlipidemia, unspecified    Hypertension    Hypothyroidism, unspecified    Mild CAD    PAF (paroxysmal atrial fibrillation) (HCC)    Pre-diabetes    Pulmonary emboli (HCC)    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 by duplex 2023   Vitamin B12 deficiency    Past Surgical History:  Past Surgical History:  Procedure Laterality Date   ABDOMINAL HYSTERECTOMY     BIOPSY  01/13/2023   Procedure: BIOPSY;  Surgeon: Jeani Hawking, MD;  Location: [facility] ENDOSCOPY;  Service: Gastroenterology;;   COLONOSCOPY WITH PROPOFOL N/A 01/15/2023   Procedure: COLONOSCOPY WITH PROPOFOL;  Surgeon: Tressia Danas, MD;  Location: [facility] ENDOSCOPY;  Service: Gastroenterology;  Laterality: N/A;   ENTEROSCOPY N/A 11/18/2022   Procedure: ENTEROSCOPY;  Surgeon: Jeani Hawking, MD;  Location: [facility] ENDOSCOPY;  Service: Gastroenterology;  Laterality: N/A;   ENTEROSCOPY N/A 01/13/2023   Procedure: ENTEROSCOPY;  Surgeon: Jeani Hawking, MD;  Location: [facility] ENDOSCOPY;  Service: Gastroenterology;  Laterality: N/A;   GIVENS CAPSULE STUDY N/A 04/16/2018   Procedure: GIVENS CAPSULE STUDY;  Surgeon: Jeani Hawking, MD;  Location: [facility] ENDOSCOPY;  Service: Endoscopy;  Laterality: N/A;   GIVENS CAPSULE STUDY  01/15/2023   Procedure: GIVENS CAPSULE STUDY;  Surgeon: Tressia Danas, MD;  Location: [facility] ENDOSCOPY;  Service: Gastroenterology;;   HOT HEMOSTASIS N/A 11/18/2022   Procedure: HOT HEMOSTASIS (ARGON PLASMA COAGULATION/BICAP);  Surgeon: Jeani Hawking, MD;  Location: [facility] ENDOSCOPY;  Service: Gastroenterology;  Laterality: N/A;   PARS PLANA  VITRECTOMY Right 08/16/2020   Procedure: 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;  Surgeon: Carmela Rima, MD;  Location: [facility] OR;  Service: Ophthalmology;  Laterality: Right;   PARS PLANA VITRECTOMY Right 09/29/2020   Procedure: PARS PLANA VITRECTOMY WITH 25 GAUGE - ENDOCAUTRY;  Surgeon: Carmela Rima, MD;  Location: [facility] OR;  Service: Ophthalmology;  Laterality: Right;   PHOTOCOAGULATION WITH LASER Right 08/16/2020   Procedure: PHOTOCOAGULATION WITH LASER;  Surgeon: Carmela Rima, MD;  Location[facility] OR;  Service: Ophthalmology;  Laterality: Right;   PHOTOCOAGULATION WITH LASER Right 09/29/2020   Procedure: PHOTOCOAGULATION WITH LASER;  Surgeon: Carmela Rima, MD;  Location: [facility] OR;  Service: Ophthalmology;  Laterality: Right;   POLYPECTOMY  01/15/2023   Procedure: POLYPECTOMY;  Surgeon: Tressia Danas, MD;  Location: [facility] ENDOSCOPY;  Service: Gastroenterology;;   SHOULDER SURGERY     TONSILLECTOMY     TUBAL LIGATION     HPI:  Per MD note "Anna Bernard is a 73 y.o. female with medical history significant of anemia, carotid artery disease, hyperlipidemia, hypertension, hypothyroidism, mild CAD, paroxysmal atrial fibrillation, prediabetes, pulmonary emboli, right subclavian artery, vitamin B12 deficiency who was brought to the emergency department via EMS after they were called by her daughter stating that the patient has decreased mentation with increased somnolence, decreased oral intake and inability to help with transfers.  She is unable to provide further information from now and only follows simple commands."  Swallow evaluation ordered. She has h/o some dysphagia s/p MBS over summer 2024. Takes Protonix and Pepcid and admits to GERD to this SLP.  Pt with  PMH + for syncope, prior smoker and her spouse her    Assessment / Plan / Recommendation  Clinical Impression  Pt grossly weak, requiring hand over hand assist to bring hand to mouth and is severely  dysphonic with weak cued cough.  She is also observed to lean to the right, which she states is not baseline. Subtle coughing noted after thin liquid swallows= concerning for potential aspiration - likely premature spillage into larynx prior to swallow trigger.  No clinical indication of aspiration or severe dysphagia with tsps of thin, ice chips, nectar liquids, applesauce nor soft sandwich.  She is slow to masticate and swallow and demonstrates occasional dry swallows.  When provided with choices in term of ease of swallowing, pt advised nectar thick liquid was easier to swallow than thin - suspect improved control with slight thickening.  Recommend dys3/nectar and thin water between meals.  SLP will follow up for determining readiness for dietary advancement and clinical indication for instrumental eval.    Of note, pt is on a PPI and an H2 Blocker prior to admission - and she endorses issues with reflux.  Per prior MBS 03/2023, esophageal sweep was unrevealing *MBS does not diagnose esophagus* and pt demonstrated no aspiration at that time.    CXR negative currently and recommend encourage free water between meals for hydration purposes please.  SLP Visit Diagnosis: Dysphagia, unspecified (R13.10);Dysphagia, oral phase (R13.11)    Aspiration Risk  Mild aspiration risk    Diet Recommendation Dysphagia 3 (Mech soft);Nectar-thick liquid (water protocol)    Liquid Administration via: Cup;Spoon;No straw Medication Administration: Whole meds with puree Supervision: Full supervision/cueing for compensatory strategies Compensations: Slow rate;Small sips/bites (all time for dry swallows) Postural Changes: Seated upright at 90 degrees;Remain upright for at least 30 minutes after po intake    Other  Recommendations Oral Care Recommendations: Oral care BID    Recommendations for follow up therapy are one component of a multi-disciplinary discharge planning process, led by the attending physician.   Recommendations may be updated based on patient status, additional functional criteria and insurance authorization.  Follow up Recommendations Other (comment)      Assistance Recommended at Discharge    Functional Status Assessment Patient has had a recent decline in their functional status and demonstrates the ability to make significant improvements in function in a reasonable and predictable amount of time.  Frequency and Duration min 1 x/week  1 week       Prognosis Prognosis for improved oropharyngeal function: Good Barriers to Reach Goals: Behavior      Swallow Study   General HPI: Per MD note "Anna Bernard is a 73 y.o. female with medical history significant of anemia, carotid artery disease, hyperlipidemia, hypertension, hypothyroidism, mild CAD, paroxysmal atrial fibrillation, prediabetes, pulmonary emboli, right subclavian artery, vitamin B12 deficiency who was brought to the emergency department via EMS after they were called by her daughter stating that the patient has decreased mentation with increased somnolence, decreased oral intake and inability to help with transfers.  She is unable to provide further information from now and only follows simple commands."  Swallow evaluation ordered. She has h/o some dysphagia s/p MBS over summer 2024. Takes Protonix and Pepcid and admits to GERD to this SLP.  Pt with  PMH + for syncope, prior smoker and her spouse her Type of Study: Bedside Swallow Evaluation Previous Swallow Assessment: mbs Diet Prior to this Study: NPO Temperature Spikes Noted: No Respiratory Status: Room air History  of Recent Intubation: No Behavior/Cognition: Alert;Cooperative;Pleasant mood Oral Cavity Assessment: Within Functional Limits Oral Care Completed by SLP: Yes Oral Cavity - Dentition: Adequate natural dentition Self-Feeding Abilities: Total assist (hand over hand assist) Patient Positioning: Upright in bed Baseline Vocal Quality: Low vocal  intensity Volitional Cough: Weak Volitional Swallow: Unable to elicit    Oral/Motor/Sensory Function Overall Oral Motor/Sensory Function: Generalized oral weakness   Ice Chips Ice chips: Within functional limits Presentation: Spoon   Thin Liquid Thin Liquid: Impaired Presentation: Straw;Cup;Spoon Pharyngeal  Phase Impairments: Cough - Immediate Other Comments: multiple audible swallows    Nectar Thick Nectar Thick Liquid: Within functional limits Presentation: Cup;Spoon;Straw Other Comments: multiple audible swallows   Honey Thick Honey Thick Liquid: Not tested   Puree Puree: Within functional limits Presentation: Spoon   Solid     Solid: Impaired Presentation: Spoon Oral Phase Impairments: Other (comment) (slow and prolonged mastication) Oral Phase Functional Implications: Prolonged oral transit Pharyngeal Phase Impairments: Suspected delayed Swallow;Multiple swallows      Anna Bernard 11/29/2023,2:22 PM   Anna Infante, MS [facility] SLP Acute Rehab Services Office [phone]

## 2023-11-29 NOTE — Progress Notes (Signed)
 PROGRESS NOTE    Anna Bernard  JWJ:[id number] DOB: 1951-08-22 DOA: 11/28/2023 PCP: Billey Co, MD    Brief Narrative:   Anna Bernard is a 73 y.o. female with past medical history significant for HTN, HLD, CAD, hypothyroidism, paroxysmal atrial fibrillation no longer on anticoagulation, history of PE, hypothyroid, anemia, Parkinson's disease, vitamin B12 deficiency, right subclavian artery stenosis, who presented to [facility] ED via EMS from home for progressive weakness/lethargy, confusion.  Family reports decreased urine output and oral intake.  Patient unable to provide further information and only follows simple commands.  In the ED, temperature 100.5 F, HR 86, RR 23, BP 103/62, SpO2 95% on room air.  WBC 12.0, hemoglobin 9.2, platelet count 243.  Sodium 143, potassium 3.6, chloride 115, CO2 18, glucose 96, BUN 39, creatinine 0.71.  AST 23, ALT 7, total bilirubin 0.9.  High sensitive troponin 12.  Lactic acid 0.7> 0.8.  Urinalysis with small leukocytes, negative nitrite, greater than 300 protein, many bacteria, greater than 50 RBCs, greater than 50 WBCs.  Chest x-ray with no active cardiopulmonary disease process.  Blood cultures x 2 and urine culture obtained. The patient received 2000 mL of normal saline bolus, ceftriaxone 2 g IVPB, calcium gluconate 1 g IVPB and acetaminophen 650 mg suppository PR x 1.  Given history of ESBL, patient was started on meropenem.  TRH consulted for admission for further evaluation management of acute metabolic encephalopathy likely secondary to urinary tract infection.  Assessment & Plan:   Acute metabolic encephalopathy Urinary tract infection (Hx ESBL Ecoli) Patient presenting to ED with progressive weakness, lethargy, confusion.  Also with decreased oral intake, urine output.  History of previous ESBL E. coli urinary tract infections per review of MAR.  Patient with temperature 100.5 F on mission.  WBC count elevated 20.0 with normal  lactic acid.  Urinalysis consistent with UTI. -- Blood cultures x 2: No growth less than 24 hours -- Urine culture: Pending -- Given encephalopathy, will check CT head without contrast -- Meropenem 1 g IV every 8 hours  Dysphagia -- Seen by speech therapy -- Dysphagia 3 diet -- Aspiration precautions  Essential hypertension Paroxysmal atrial fibrillation no longer on anticoagulation -- Metoprolol tartrate 12.5 mg p.o. twice daily  Hyperlipidemia CAD -- Crestor 10 mg p.o. daily  Hypothyroidism -- Levothyroxine 100 mcg p.o. daily  History of anemia History of small bowel AVM -- Hgb 9.2>11.0 -- CBC daily  GERD -- Protonix 40 g p.o. daily as needed heartburn  History of syncope  Seen by cardiology previously, thought to be secondary to orthostasis; probably as well as autonomic dysfunction with history of Parkinson's disease. -- Supportive care, fall precautions  Parkinson disease with dementia Follows with neurology outpatient, Dr. Arbutus Leas.  Last seen in clinic on 08/30/2023.  Per neurology note, no ambulates much and memory has been very poor. -- Sinemet 4 times daily -- Melatonin 3 mg p.o. nightly -- Delirium precautions -- Get up during the day -- Encourage a familiar face to remain present throughout the day -- Keep blinds open and lights on during daylight hours -- Minimize the use of opioids/benzodiazepines  Weakness/debility/deconditioning: -- PT/OT evaluation   DVT prophylaxis: SCDs Start: 11/28/23 1400    Code Status: Full Code Family Communication: No family present at bedside this morning  Disposition Plan:  Level of care: Telemetry Status is: Inpatient Remains inpatient appropriate because: IV antibiotics, pending PT/OT evaluation, further workup    Consultants:  None  Procedures:  None  Antimicrobials:  Ceftriaxone 3/11 - 3/11 Meropenem 3/11>>   Subjective: Patient seen examined bedside, lying in bed.  RN present at bedside.  Concerned  about blood in urine in collection canister from [place]; clear if it is secondary to her renal system versus vaginal bleeding.  Hemoglobin stable.  Remains confused, weak.  Denies pain, no shortness of breath, no chest pain.  Unable to obtain any further ROS given her current mental status.  No other acute concerns overnight per nursing staff.  Objective: Vitals:   11/28/23 1823 11/28/23 2044 11/29/23 0340 11/29/23 1216  BP:  137/68 121/68   Pulse:  90 84   Resp:  16 16   Temp: 97.7 F (36.5 C) 99.6 F (37.6 C) 98.6 F (37 C)   TempSrc: Oral Oral Oral   SpO2:  100%    Weight:    72.3 kg  Height:    5\' 2"  (1.575 m)    Intake/Output Summary (Last 24 hours) at 11/29/2023 1359 Last data filed at 11/29/2023 0900 Gross per 24 hour  Intake 0 ml  Output 200 ml  Net -200 ml   Filed Weights   11/29/23 1216  Weight: 72.3 kg    Examination:  Physical Exam: GEN: NAD, weak/lethargic, chronically ill appearance, appears older than stated age HEENT: NCAT, PERRL, EOMI, sclera clear, dry mucous membranes PULM: CTAB w/o wheezes/crackles, normal respiratory effort, on room air CV: RRR w/o M/G/R GI: abd soft, NTND, + BS MSK: no peripheral edema Integumentary: No rashes/lesions/wounds nonexposed skin surfaces    Data Reviewed: I have personally reviewed following labs and imaging studies  CBC: Recent Labs  Lab 11/28/23 0944 11/29/23 0419  WBC 12.0* 13.9*  NEUTROABS 9.9*  --   HGB 9.2* 11.0*  HCT 27.6* 33.8*  MCV 81.2 83.0  PLT 243 278   Basic Metabolic Panel: Recent Labs  Lab 11/28/23 0944 11/29/23 0419  NA 143 144  K 3.6 4.4  CL 115* 111  CO2 18* 20*  GLUCOSE 96 79  BUN 39* 33*  CREATININE 0.71 0.87  CALCIUM 6.5* 8.4*   GFR: Estimated Creatinine Clearance: 55.2 mL/min (by C-G formula based on SCr of 0.87 mg/dL). Liver Function Tests: Recent Labs  Lab 11/28/23 0944 11/29/23 0419  AST 23 29  ALT 7 7  ALKPHOS 74 87  BILITOT 0.9 0.9  PROT 5.5* 6.4*  ALBUMIN  2.2* 2.5*   No results for input(s): "LIPASE", "AMYLASE" in the last 168 hours. No results for input(s): "AMMONIA" in the last 168 hours. Coagulation Profile: No results for input(s): "INR", "PROTIME" in the last 168 hours. Cardiac Enzymes: No results for input(s): "CKTOTAL", "CKMB", "CKMBINDEX", "TROPONINI" in the last 168 hours. BNP (last 3 results) No results for input(s): "PROBNP" in the last 8760 hours. HbA1C: Recent Labs    11/29/23 0419  HGBA1C 5.0   CBG: Recent Labs  Lab 11/28/23 1025 11/28/23 2147 11/29/23 0800 11/29/23 0842 11/29/23 1140  GLUCAP 105* 76 66* 111* 116*   Lipid Profile: No results for input(s): "CHOL", "HDL", "LDLCALC", "TRIG", "CHOLHDL", "LDLDIRECT" in the last 72 hours. Thyroid Function Tests: No results for input(s): "TSH", "T4TOTAL", "FREET4", "T3FREE", "THYROIDAB" in the last 72 hours. Anemia Panel: No results for input(s): "VITAMINB12", "FOLATE", "FERRITIN", "TIBC", "IRON", "RETICCTPCT" in the last 72 hours. Sepsis Labs: Recent Labs  Lab 11/28/23 1033 11/28/23 1200  LATICACIDVEN 0.7 0.8    Recent Results (from the past 240 hours)  Resp panel by RT-PCR (RSV, Flu A&B, Covid)     Status:  None   Collection Time: 11/28/23  9:20 AM   Specimen: Nasal Swab  Result Value Ref Range Status   SARS Coronavirus 2 by RT PCR NEGATIVE NEGATIVE Final    Comment: (NOTE) SARS-CoV-2 target nucleic acids are NOT DETECTED.  The SARS-CoV-2 RNA is generally detectable in upper respiratory specimens during the acute phase of infection. The lowest concentration of SARS-CoV-2 viral copies this assay can detect is 138 copies/mL. A negative result does not preclude SARS-Cov-2 infection and should not be used as the sole basis for treatment or other patient management decisions. A negative result may occur with  improper specimen collection/handling, submission of specimen other than nasopharyngeal swab, presence of viral mutation(s) within the areas targeted  by this assay, and inadequate number of viral copies(<138 copies/mL). A negative result must be combined with clinical observations, patient history, and epidemiological information. The expected result is Negative.  Fact Sheet for Patients:  BloggerCourse.com  Fact Sheet for Healthcare Providers:  SeriousBroker.it  This test is no t yet approved or cleared by the [place] FDA and  has been authorized for detection and/or diagnosis of SARS-CoV-2 by FDA under an Emergency Use Authorization (EUA). This EUA will remain  in effect (meaning this test can be used) for the duration of the COVID-19 declaration under Section 564(b)(1) of the Act, 21 U.S.C.section 360bbb-3(b)(1), unless the authorization is terminated  or revoked sooner.       Influenza A by PCR NEGATIVE NEGATIVE Final   Influenza B by PCR NEGATIVE NEGATIVE Final    Comment: (NOTE) The Xpert Xpress SARS-CoV-2/FLU/RSV plus assay is intended as an aid in the diagnosis of influenza from Nasopharyngeal swab specimens and should not be used as a sole basis for treatment. Nasal washings and aspirates are unacceptable for Xpert Xpress SARS-CoV-2/FLU/RSV testing.  Fact Sheet for Patients: BloggerCourse.com  Fact Sheet for Healthcare Providers: SeriousBroker.it  This test is not yet approved or cleared by the [place] FDA and has been authorized for detection and/or diagnosis of SARS-CoV-2 by FDA under an Emergency Use Authorization (EUA). This EUA will remain in effect (meaning this test can be used) for the duration of the COVID-19 declaration under Section 564(b)(1) of the Act, 21 U.S.C. section 360bbb-3(b)(1), unless the authorization is terminated or revoked.     Resp Syncytial Virus by PCR NEGATIVE NEGATIVE Final    Comment: (NOTE) Fact Sheet for Patients: BloggerCourse.com  Fact  Sheet for Healthcare Providers: SeriousBroker.it  This test is not yet approved or cleared by the [place] FDA and has been authorized for detection and/or diagnosis of SARS-CoV-2 by FDA under an Emergency Use Authorization (EUA). This EUA will remain in effect (meaning this test can be used) for the duration of the COVID-19 declaration under Section 564(b)(1) of the Act, 21 U.S.C. section 360bbb-3(b)(1), unless the authorization is terminated or revoked.  Performed at [facility], [address] [place], [place] [address]   Culture, blood (routine x 2)     Status: None (Preliminary result)   Collection Time: 11/28/23 10:25 AM   Specimen: BLOOD RIGHT FOREARM  Result Value Ref Range Status   Specimen Description   Final    BLOOD RIGHT FOREARM Performed at [facility], [address] [place], [place] [address]    Special Requests   Final    BOTTLES DRAWN AEROBIC AND ANAEROBIC Blood Culture adequate volume Performed at [facility], [address], [place], [place] [address]    Culture  Final    NO GROWTH < 24 HOURS Performed at [facility], [address] [place], [place] [address]    Report Status PENDING  Incomplete  Culture, blood (routine x 2)     Status: None (Preliminary result)   Collection Time: 11/28/23 10:25 AM   Specimen: BLOOD  Result Value Ref Range Status   Specimen Description   Final    BLOOD LEFT ANTECUBITAL Performed at [facility], [address] [place], [place] [address]    Special Requests   Final    BOTTLES DRAWN AEROBIC AND ANAEROBIC Blood Culture adequate volume Performed at [facility], [address], [place], [place] [address]    Culture   Final    NO GROWTH < 24 HOURS Performed at [facility], [address] [place], [place] [address]    Report Status PENDING  Incomplete         Radiology Studies: DG  Chest Portable 1 View Result Date: 11/28/2023 CLINICAL DATA:  Altered mental status. EXAM: PORTABLE CHEST 1 VIEW COMPARISON:  11/17/2022 FINDINGS: Heart size and mediastinal contours appear normal. There is no pleural fluid, interstitial edema or airspace consolidation. No acute osseous findings. IMPRESSION: No active disease. Electronically Signed   By: Signa Kell M.D.   On: 11/28/2023 12:07        Scheduled Meds:  Carbidopa-Levodopa ER  1 tablet Oral Q2000   Carbidopa-Levodopa ER  2 tablet Oral TID PC   DULoxetine  20 mg Oral q morning   feeding supplement  237 mL Oral BID BM   levothyroxine  100 mcg Oral Q0600   metoprolol tartrate  12.5 mg Oral BID   rosuvastatin  10 mg Oral QPM   senna-docusate  1 tablet Oral QHS   Continuous Infusions:  dextrose 5 % and 0.45 % NaCl 75 mL/hr at 11/29/23 0857   meropenem (MERREM) IV 1 g (11/29/23 1227)     LOS: 1 day    Time spent: 52 minutes spent on 11/29/2023 caring for this patient face-to-face including chart review, ordering labs/tests, documenting, discussion with nursing staff, consultants, updating family and interview/physical exam    Alvira Philips [place], DO [facility] Available via Epic secure chat 7am-7pm After these hours, please refer to coverage provider listed on [email or web address] 11/29/2023, 1:59 PM

## 2023-11-29 NOTE — Progress Notes (Signed)
 Cleaning pt, noticed blood in private area. On call NP aware. No dizziness, chest pain or SOB.

## 2023-11-29 NOTE — Progress Notes (Signed)
 BSE completed, full report to follow.  Pt grossly weak, requiring hand over hand assist to bring hand to mouth and is severely dysphonic with weak cued cough.  She is also observed to lean to the right, which she states is not baseline. Subtle coughing noted after thin liquid swallows= concerning for potential aspiration - likely premature spillage into larynx prior to swallow trigger.  No clinical indication of aspiration or severe dysphagia with tsps of thin, ice chips, nectar liquids, applesauce nor soft sandwich.  She is slow to masticate and swallow and demonstrates occasional dry swallows.  When provided with choices in term of ease of swallowing, pt advised nectar thick liquid was easier to swallow than thin - suspect improved control with slight thickening.  Recommend dys3/nectar and thin water between meals.  SLP will follow up for determining readiness for dietary advancement and clinical indication for instrumental eval.Of note, pt is on a PPI and an H2 Blocker prior to admission - and she endorses issues with reflux.  Per prior MBS 03/2023, esophageal sweep was unrevealing *MBS does not diagnose esophagus* and pt demonstrated no aspiration at that time.  CXR negative currently.    Anna Infante, MS [facility] SLP Acute [employer] [phone]

## 2023-11-30 DIAGNOSIS — N390 Urinary tract infection, site not specified: Secondary | ICD-10-CM | POA: Diagnosis not present

## 2023-11-30 LAB — BLOOD CULTURE ID PANEL (REFLEXED) - BCID2

## 2023-11-30 LAB — BASIC METABOLIC PANEL
Anion gap: 6 (ref 5–15)
BUN: 29 mg/dL — ABNORMAL HIGH (ref 8–23)
CO2: 25 mmol/L (ref 22–32)
Calcium: 8.3 mg/dL — ABNORMAL LOW (ref 8.9–10.3)
Chloride: 111 mmol/L (ref 98–111)
Creatinine, Ser: 0.61 mg/dL (ref 0.44–1.00)
GFR, Estimated: >60 mL/min (ref >60)
Glucose, Bld: 119 mg/dL — ABNORMAL HIGH (ref 70–99)
Potassium: 3.8 mmol/L (ref 3.5–5.1)
Sodium: 142 mmol/L (ref 135–145)

## 2023-11-30 LAB — PHOSPHORUS: Phosphorus: 2 mg/dL — ABNORMAL LOW (ref 2.5–4.6)

## 2023-11-30 LAB — CBC
HCT: 31.3 % — ABNORMAL LOW (ref 36.0–46.0)
Hemoglobin: 10.5 g/dL — ABNORMAL LOW (ref 12.0–15.0)
MCH: 26.9 pg (ref 26.0–34.0)
MCHC: 33.5 g/dL (ref 30.0–36.0)
MCV: 80.1 fL (ref 80.0–100.0)
Platelets: 294 10*3/uL (ref 150–400)
RBC: 3.91 MIL/uL (ref 3.87–5.11)
RDW: 14.2 % (ref 11.5–15.5)
WBC: 10.7 10*3/uL — ABNORMAL HIGH (ref 4.0–10.5)
nRBC: 0 % (ref 0.0–0.2)

## 2023-11-30 LAB — T4, FREE: Free T4: 0.94 ng/dL (ref 0.61–1.12)

## 2023-11-30 LAB — GLUCOSE, CAPILLARY
Glucose-Capillary: 125 mg/dL — ABNORMAL HIGH (ref 70–99)
Glucose-Capillary: 107 mg/dL — ABNORMAL HIGH (ref 70–99)
Glucose-Capillary: 138 mg/dL — ABNORMAL HIGH (ref 70–99)
Glucose-Capillary: 94 mg/dL (ref 70–99)

## 2023-11-30 LAB — TSH: TSH: 3.198 u[IU]/mL (ref 0.350–4.500)

## 2023-11-30 LAB — MAGNESIUM: Magnesium: 2.1 mg/dL (ref 1.7–2.4)

## 2023-11-30 NOTE — Progress Notes (Signed)
 Speech Language Pathology Treatment: Dysphagia  Patient Details Name: Anna Bernard MRN: [id number] DOB: 1951-09-13 Today's Date: 11/30/2023 Time: 7829-5621 SLP Time Calculation (min) (ACUTE ONLY): 40 min  Assessment / Plan / Recommendation Clinical Impression  Patient seen for skilled SLP dysphagia treatment.  Her meal tray arrived right for SLP arrived to room therefore facilitated session with her meal.  Initially daughter was present and then spouse arrived later.    Daughter denies patient having issues with coughing with intake at home except when taking medications when poorly positioned in bed.  She reports they have noted improvement with her tolerance of medications while sitting upright.  SLP advised that they could also consider giving medications with slightly naturally thicker liquids - I.e. Ensure, Glucerna, tomato juice if patient tolerates better  SLP facilitated p.o. intake utilizing hand overhand assistance for improved neurological input for patient's airway protection with p.o.  Patient demonstrating difficulty with getting food to her mouth when trying to feed independently therefore encouraged her spouse to help her hand over hand.  Slow but adequate mastication of soft solid foods noted with oral retention noted in left buccal region after meal consumption without awareness.   No clinical indication of aspiration across all p.o. including liquids via straws.  Patient consumed approximately 15% of her meal over 30 minutes session of facilitating her eating.  Eating is a laborious process for this patient however which she does not report is enjoyable therefore maximizing liquid nutrition as adjunct would be helpful for swallow efficiency to ensure adequate calorie consumption if RD and MD agrees.   Patient reports her swallowing ability to be the same as during prior hospital admission.  Encouraging patient to consume water to help with hydration needs advised.  Use of straw  tolerated well today and improves efficiency of pt's swallowing.  Spouse reports at times but can not form suction on straw= ? If due to weakness due to lethargy or cognition - Demonstrated stimulation with tip of straw to mouth to encourage labial seal and then trying straw sips bolus again.  He advised understanding to information provided.  Further discussed option of counting 1,2,3..  To help initiate motor movement possibly being helpful to elicit swallow if patient is having difficulties due to her parkinsonism.  Spouse educated using teach back and reported gratitude for information.    Patient required max cues today to demonstrate phonation and spouse reports that he cannot hear her at home.  Discussed options of amplifier that spouse could purchase off of [employer] but encouraged patient to speak as loudly as she could to help with pulmonary and laryngeal strength as well as listener comprehension.      HPI HPI: Per MD note "Anna Bernard is a 73 y.o. female with medical history significant of anemia, carotid artery disease, hyperlipidemia, hypertension, hypothyroidism, mild CAD, paroxysmal atrial fibrillation, vascular parkinsonism, prediabetes, pulmonary emboli, right subclavian artery, vitamin B12 deficiency who was brought to the emergency department via EMS after they were called by her daughter stating that the patient has decreased mentation with increased somnolence, decreased oral intake and inability to help with transfers.  She is unable to provide further information from now and only follows simple commands."  Swallow evaluation ordered. She has h/o some dysphagia s/p MBS over summer 2024. Takes Protonix and Pepcid and admits to GERD to this SLP.  Pt with  PMH + for syncope, prior smoker and she resides with her spouse.      SLP Plan  Continue with current plan of care      Recommendations for follow up therapy are one component of a multi-disciplinary discharge planning process,  led by the attending physician.  Recommendations may be updated based on patient status, additional functional criteria and insurance authorization.    Recommendations  Diet recommendations: Dysphagia 3 (mechanical soft);Thin liquid Liquids provided via: Cup;Straw Medication Administration: Other (Comment) (whole with puree or whole with Ensure) Supervision: Full supervision/cueing for compensatory strategies;Trained caregiver to feed patient (educated spouse on feeding strategies) Compensations: Slow rate;Small sips/bites (all time for dry swallows) Postural Changes and/or Swallow Maneuvers: Seated upright 90 degrees;Upright 30-60 min after meal                  Oral care BID     Dysphagia, unspecified (R13.10);Dysphagia, oral phase (R13.11)     Continue with current plan of care   Rolena Infante, MS [facility] SLP Acute Rehab Services Office [phone]   Chales Abrahams  11/30/2023, 5:40 PM

## 2023-11-30 NOTE — Consult Note (Signed)
 WOC Nurse Consult Note: Reason for Consult: Pressure Injury  Wound type: Deep Tissue Pressure Injury R buttock  Pressure Injury POA: Yes Measurement: see nursing flowsheet  Wound bed: purple maroon discoloration skin intact  Drainage (amount, consistency, odor) none  Periwound: intact  Dressing procedure/placement/frequency: Cleanse buttocks with soap and water, dry and apply Xeroform gauze [place] [phone]) daily to purple maroon discoloration.  Cover with silicone foam or ABD pad whichever is preferred.   Patient should be placed on a low air loss mattress for pressure redistribution.   POC discussed with primary nurse. WOC team will not follow. Re-consult if further needs arise.   Thank you,    Priscella Mann MSN, RN-BC, [employer] [phone]  ):

## 2023-11-30 NOTE — Progress Notes (Signed)
 PROGRESS NOTE    Anna Bernard  MVH:[id number] DOB: 1951-01-22 DOA: 11/28/2023 PCP: Billey Co, MD    Brief Narrative:   Anna Bernard is a 73 y.o. female with past medical history significant for HTN, HLD, CAD, hypothyroidism, paroxysmal atrial fibrillation no longer on anticoagulation, history of PE, hypothyroid, anemia, Parkinson's disease, vitamin B12 deficiency, right subclavian artery stenosis, who presented to [facility] ED via EMS from home for progressive weakness/lethargy, confusion.  Family reports decreased urine output and oral intake.  Patient unable to provide further information and only follows simple commands.  In the ED, temperature 100.5 F, HR 86, RR 23, BP 103/62, SpO2 95% on room air.  WBC 12.0, hemoglobin 9.2, platelet count 243.  Sodium 143, potassium 3.6, chloride 115, CO2 18, glucose 96, BUN 39, creatinine 0.71.  AST 23, ALT 7, total bilirubin 0.9.  High sensitive troponin 12.  Lactic acid 0.7> 0.8.  Urinalysis with small leukocytes, negative nitrite, greater than 300 protein, many bacteria, greater than 50 RBCs, greater than 50 WBCs.  Chest x-ray with no active cardiopulmonary disease process.  Blood cultures x 2 and urine culture obtained. The patient received 2000 mL of normal saline bolus, ceftriaxone 2 g IVPB, calcium gluconate 1 g IVPB and acetaminophen 650 mg suppository PR x 1.  Given history of ESBL, patient was started on meropenem.  TRH consulted for admission for further evaluation management of acute metabolic encephalopathy likely secondary to urinary tract infection.  Assessment & Plan:   Acute metabolic encephalopathy ESBL E. coli bacteremia/UTI Hemorrhagic cystitis Patient presenting to ED with progressive weakness, lethargy, confusion.  Also with decreased oral intake, urine output.  History of previous ESBL E. coli urinary tract infections per review of MAR.  Patient with temperature 100.5 F on mission.  WBC count elevated 20.0 with  normal lactic acid.  Urinalysis consistent with UTI.  CT head without contrast with no acute intracranial abnormality. -- WBC 12.0>13.9>18.1 -- Blood cultures x 2: + GNR, BCID + ESBL Ecoli; susceptibilities pending -- Urine culture: + GNR; further notification/susceptibilities pending -- Meropenem 1 g IV every 8 hours -- CBC daily; monitor urine output, bladder scan as needed  Dysphagia -- Seen by speech therapy -- Dysphagia 3 diet -- Aspiration precautions; assist with feeding  Essential hypertension Paroxysmal atrial fibrillation no longer on anticoagulation -- Metoprolol tartrate 12.5 mg p.o. twice daily  Hyperlipidemia CAD -- Crestor 10 mg p.o. daily  Hypothyroidism -- Levothyroxine 100 mcg p.o. daily  History of anemia History of small bowel AVM -- Hgb 9.2>11.0>10.5 -- CBC daily  GERD -- Protonix 40 mg p.o. daily as needed heartburn  History of syncope  Seen by cardiology previously, thought to be secondary to orthostasis; probably as well as autonomic dysfunction with history of Parkinson's disease. -- Supportive care, fall precautions  Parkinson disease with dementia Follows with neurology outpatient, Dr. Arbutus Leas.  Last seen in clinic on 08/30/2023.  Per neurology note, no ambulates much and memory has been very poor. -- Sinemet 4 times daily -- Melatonin 3 mg p.o. nightly -- Delirium precautions -- Get up during the day -- Encourage a familiar face to remain present throughout the day -- Keep blinds open and lights on during daylight hours -- Minimize the use of opioids/benzodiazepines  Weakness/debility/deconditioning: -- PT/OT evaluation: Pending   Pressure injury, right buttock, stage I sacrum, ischial tuberosity, POA Pressure Injury 11/29/23 Buttocks Right Deep Tissue Pressure Injury - Purple or maroon localized area of discolored intact skin or  blood-filled blister due to damage of underlying soft tissue from pressure and/or shear. unblanchable area, dark in  color w (Active)  11/29/23 0700  Location: Buttocks  Location Orientation: Right  Staging: Deep Tissue Pressure Injury - Purple or maroon localized area of discolored intact skin or blood-filled blister due to damage of underlying soft tissue from pressure and/or shear.  Wound Description (Comments): unblanchable area, dark in color with blistered skin  Present on Admission: Yes     Pressure Injury 11/29/23 Sacrum Mid Stage 1 -  Intact skin with non-blanchable redness of a localized area usually over a bony prominence. (Active)  11/29/23 0700  Location: Sacrum  Location Orientation: Mid  Staging: Stage 1 -  Intact skin with non-blanchable redness of a localized area usually over a bony prominence.  Wound Description (Comments):   Present on Admission: Yes     Pressure Injury 11/29/23 Ischial tuberosity Right Deep Tissue Pressure Injury - Purple or maroon localized area of discolored intact skin or blood-filled blister due to damage of underlying soft tissue from pressure and/or shear. (Active)  11/29/23 0700  Location: Ischial tuberosity  Location Orientation: Right  Staging: Deep Tissue Pressure Injury - Purple or maroon localized area of discolored intact skin or blood-filled blister due to damage of underlying soft tissue from pressure and/or shear.  Wound Description (Comments):   Present on Admission: Yes     Pressure Injury Ischial tuberosity Left Deep Tissue Pressure Injury - Purple or maroon localized area of discolored intact skin or blood-filled blister due to damage of underlying soft tissue from pressure and/or shear. (Active)     Location: Ischial tuberosity  Location Orientation: Left  Staging: Deep Tissue Pressure Injury - Purple or maroon localized area of discolored intact skin or blood-filled blister due to damage of underlying soft tissue from pressure and/or shear.  Wound Description (Comments):   Present on Admission: Yes  Seen by wound RN, continue local wound  care, offloading, air mattress     DVT prophylaxis: SCDs Start: 11/28/23 1400    Code Status: Full Code Family Communication: No family present at bedside this morning  Disposition Plan:  Level of care: Med-Surg Status is: Inpatient Remains inpatient appropriate because: IV antibiotics, pending PT/OT evaluation, further workup    Consultants:  None  Procedures:  None  Antimicrobials:  Ceftriaxone 3/11 - 3/11 Meropenem 3/11>>   Subjective: Patient seen examined bedside, lying in bed.  Husband present at bedside.  Updated patient's daughter via speaker phone in room.  Discussed findings of E. coli bacteremia and UTI causing her encephalopathy.  Patient appears to be more alert this morning, eating breakfast.  Family does want to make sure that staff are assisting her with meals when they are not present.  Orders have been placed.  Patient continues with generalized weakness/fatigue. Denies pain, no shortness of breath, no chest pain.  No acute concerns overnight per nursing staff.  Objective: Vitals:   11/29/23 1216 11/29/23 2115 11/30/23 0528 11/30/23 0920  BP:  (!) 119/58 (!) 145/75 139/71  Pulse:  80 74 81  Resp:  19 16   Temp:  99.2 F (37.3 C) 97.6 F (36.4 C)   TempSrc:  Oral Oral   SpO2:  97% 99%   Weight: 72.3 kg     Height: 5\' 2"  (1.575 m)       Intake/Output Summary (Last 24 hours) at 11/30/2023 1244 Last data filed at 11/30/2023 1200 Gross per 24 hour  Intake 1805.87 ml  Output 550  ml  Net 1255.87 ml   Filed Weights   11/29/23 1216  Weight: 72.3 kg    Examination:  Physical Exam: GEN: NAD, weak/lethargic, chronically ill appearance, appears older than stated age HEENT: NCAT, PERRL, EOMI, sclera clear, dry mucous membranes PULM: CTAB w/o wheezes/crackles, normal respiratory effort, on room air CV: RRR w/o M/G/R GI: abd soft, NTND, + BS MSK: no peripheral edema Integumentary: No rashes/lesions/wounds nonexposed skin surfaces    Data Reviewed:  I have personally reviewed following labs and imaging studies  CBC: Recent Labs  Lab 11/28/23 0944 11/29/23 0419 11/30/23 0431  WBC 12.0* 13.9* 10.7*  NEUTROABS 9.9*  --   --   HGB 9.2* 11.0* 10.5*  HCT 27.6* 33.8* 31.3*  MCV 81.2 83.0 80.1  PLT 243 278 294   Basic Metabolic Panel: Recent Labs  Lab 11/28/23 0944 11/29/23 0419 11/30/23 0431  NA 143 144 142  K 3.6 4.4 3.8  CL 115* 111 111  CO2 18* 20* 25  GLUCOSE 96 79 119*  BUN 39* 33* 29*  CREATININE 0.71 0.87 0.61  CALCIUM 6.5* 8.4* 8.3*  MG  --   --  2.1  PHOS  --   --  2.0*   GFR: Estimated Creatinine Clearance: 60.1 mL/min (by C-G formula based on SCr of 0.61 mg/dL). Liver Function Tests: Recent Labs  Lab 11/28/23 0944 11/29/23 0419  AST 23 29  ALT 7 7  ALKPHOS 74 87  BILITOT 0.9 0.9  PROT 5.5* 6.4*  ALBUMIN 2.2* 2.5*   No results for input(s): "LIPASE", "AMYLASE" in the last 168 hours. No results for input(s): "AMMONIA" in the last 168 hours. Coagulation Profile: No results for input(s): "INR", "PROTIME" in the last 168 hours. Cardiac Enzymes: No results for input(s): "CKTOTAL", "CKMB", "CKMBINDEX", "TROPONINI" in the last 168 hours. BNP (last 3 results) No results for input(s): "PROBNP" in the last 8760 hours. HbA1C: Recent Labs    11/29/23 0419  HGBA1C 5.0   CBG: Recent Labs  Lab 11/29/23 1140 11/29/23 1613 11/29/23 2130 11/30/23 0745 11/30/23 1202  GLUCAP 116* 161* 184* 125* 107*   Lipid Profile: No results for input(s): "CHOL", "HDL", "LDLCALC", "TRIG", "CHOLHDL", "LDLDIRECT" in the last 72 hours. Thyroid Function Tests: Recent Labs    11/30/23 0431  TSH 3.198  FREET4 0.94   Anemia Panel: Recent Labs    11/29/23 1503  VITAMINB12 565  FOLATE 29.3  FERRITIN 258  TIBC 176*  IRON 20*  RETICCTPCT 0.6   Sepsis Labs: Recent Labs  Lab 11/28/23 1033 11/28/23 1200  LATICACIDVEN 0.7 0.8    Recent Results (from the past 240 hours)  Resp panel by RT-PCR (RSV, Flu A&B,  Covid)     Status: None   Collection Time: 11/28/23  9:20 AM   Specimen: Nasal Swab  Result Value Ref Range Status   SARS Coronavirus 2 by RT PCR NEGATIVE NEGATIVE Final    Comment: (NOTE) SARS-CoV-2 target nucleic acids are NOT DETECTED.  The SARS-CoV-2 RNA is generally detectable in upper respiratory specimens during the acute phase of infection. The lowest concentration of SARS-CoV-2 viral copies this assay can detect is 138 copies/mL. A negative result does not preclude SARS-Cov-2 infection and should not be used as the sole basis for treatment or other patient management decisions. A negative result may occur with  improper specimen collection/handling, submission of specimen other than nasopharyngeal swab, presence of viral mutation(s) within the areas targeted by this assay, and inadequate number of viral copies(<138 copies/mL). A negative  result must be combined with clinical observations, patient history, and epidemiological information. The expected result is Negative.  Fact Sheet for Patients:  BloggerCourse.com  Fact Sheet for Healthcare Providers:  SeriousBroker.it  This test is no t yet approved or cleared by the [place] FDA and  has been authorized for detection and/or diagnosis of SARS-CoV-2 by FDA under an Emergency Use Authorization (EUA). This EUA will remain  in effect (meaning this test can be used) for the duration of the COVID-19 declaration under Section 564(b)(1) of the Act, 21 U.S.C.section 360bbb-3(b)(1), unless the authorization is terminated  or revoked sooner.       Influenza A by PCR NEGATIVE NEGATIVE Final   Influenza B by PCR NEGATIVE NEGATIVE Final    Comment: (NOTE) The Xpert Xpress SARS-CoV-2/FLU/RSV plus assay is intended as an aid in the diagnosis of influenza from Nasopharyngeal swab specimens and should not be used as a sole basis for treatment. Nasal washings and aspirates are  unacceptable for Xpert Xpress SARS-CoV-2/FLU/RSV testing.  Fact Sheet for Patients: BloggerCourse.com  Fact Sheet for Healthcare Providers: SeriousBroker.it  This test is not yet approved or cleared by the [place] FDA and has been authorized for detection and/or diagnosis of SARS-CoV-2 by FDA under an Emergency Use Authorization (EUA). This EUA will remain in effect (meaning this test can be used) for the duration of the COVID-19 declaration under Section 564(b)(1) of the Act, 21 U.S.C. section 360bbb-3(b)(1), unless the authorization is terminated or revoked.     Resp Syncytial Virus by PCR NEGATIVE NEGATIVE Final    Comment: (NOTE) Fact Sheet for Patients: BloggerCourse.com  Fact Sheet for Healthcare Providers: SeriousBroker.it  This test is not yet approved or cleared by the [place] FDA and has been authorized for detection and/or diagnosis of SARS-CoV-2 by FDA under an Emergency Use Authorization (EUA). This EUA will remain in effect (meaning this test can be used) for the duration of the COVID-19 declaration under Section 564(b)(1) of the Act, 21 U.S.C. section 360bbb-3(b)(1), unless the authorization is terminated or revoked.  Performed at [facility], [address] [place], [place] [address]   Urine Culture (for pregnant, neutropenic or urologic patients or patients with an indwelling urinary catheter)     Status: Abnormal (Preliminary result)   Collection Time: 11/28/23  9:44 AM   Specimen: Urine, Clean Catch  Result Value Ref Range Status   Specimen Description   Final    URINE, CLEAN CATCH Performed at [facility], [address] [place], [place] [address]    Special Requests   Final    NONE Performed at [facility], [address], [place], [place] [address]    Culture (A)  Final     >=100,000 COLONIES/mL GRAM NEGATIVE RODS IDENTIFICATION TO FOLLOW Performed at [facility], [address], [place], [place] [address]    Report Status PENDING  Incomplete  Culture, blood (routine x 2)     Status: None (Preliminary result)   Collection Time: 11/28/23 10:25 AM   Specimen: BLOOD RIGHT FOREARM  Result Value Ref Range Status   Specimen Description   Final    BLOOD RIGHT FOREARM Performed at [facility], [address] [place], [place] [address]    Special Requests   Final    BOTTLES DRAWN AEROBIC AND ANAEROBIC Blood Culture adequate volume Performed at [facility], [address], [place], [place] [address]    Culture  Final    NO GROWTH 2 DAYS Performed at [facility], [address], [place], [place] [address]    Report Status PENDING  Incomplete  Culture, blood (routine x 2)     Status: None (Preliminary result)   Collection Time: 11/28/23 10:25 AM   Specimen: BLOOD  Result Value Ref Range Status   Specimen Description   Final    BLOOD LEFT ANTECUBITAL Performed at [facility], [address] [place], [place] [address]    Special Requests   Final    BOTTLES DRAWN AEROBIC AND ANAEROBIC Blood Culture adequate volume Performed at [facility], [address] [place], [place] [address]    Culture  Setup Time   Final    GRAM NEGATIVE RODS AEROBIC BOTTLE ONLY CRITICAL RESULT CALLED TO, READ BACK BY AND VERIFIED WITH: PHARMD T.GREEN AT 1000 ON 11/30/2023 BY T.SAAD. Performed at [facility], [address] [place], [place] [address]    Culture GRAM NEGATIVE RODS  Final   Report Status PENDING  Incomplete  Blood Culture ID Panel (Reflexed)     Status: Abnormal   Collection Time: 11/28/23 10:25 AM  Result Value Ref Range Status   Enterococcus faecalis NOT DETECTED NOT DETECTED Final   Enterococcus Faecium NOT DETECTED NOT DETECTED Final   Listeria monocytogenes NOT DETECTED  NOT DETECTED Final   Staphylococcus species NOT DETECTED NOT DETECTED Final   Staphylococcus aureus (BCID) NOT DETECTED NOT DETECTED Final   Staphylococcus epidermidis NOT DETECTED NOT DETECTED Final   Staphylococcus lugdunensis NOT DETECTED NOT DETECTED Final   Streptococcus species NOT DETECTED NOT DETECTED Final   Streptococcus agalactiae NOT DETECTED NOT DETECTED Final   Streptococcus pneumoniae NOT DETECTED NOT DETECTED Final   Streptococcus pyogenes NOT DETECTED NOT DETECTED Final   A.calcoaceticus-baumannii NOT DETECTED NOT DETECTED Final   Bacteroides fragilis NOT DETECTED NOT DETECTED Final   Enterobacterales DETECTED (A) NOT DETECTED Final    Comment: Enterobacterales represent a large order of gram negative bacteria, not a single organism. CRITICAL RESULT CALLED TO, READ BACK BY AND VERIFIED WITH: PHARMD T.GREEN AT 1000 ON 11/30/2023 BY T.SAAD.    Enterobacter cloacae complex NOT DETECTED NOT DETECTED Final   Escherichia coli DETECTED (A) NOT DETECTED Final    Comment: CRITICAL RESULT CALLED TO, READ BACK BY AND VERIFIED WITH: PHARMD T.GREEN AT 1000 ON 11/30/2023 BY T.SAAD.    Klebsiella aerogenes NOT DETECTED NOT DETECTED Final   Klebsiella oxytoca NOT DETECTED NOT DETECTED Final   Klebsiella pneumoniae NOT DETECTED NOT DETECTED Final   Proteus species NOT DETECTED NOT DETECTED Final   Salmonella species NOT DETECTED NOT DETECTED Final   Serratia marcescens NOT DETECTED NOT DETECTED Final   Haemophilus influenzae NOT DETECTED NOT DETECTED Final   Neisseria meningitidis NOT DETECTED NOT DETECTED Final   Pseudomonas aeruginosa NOT DETECTED NOT DETECTED Final   Stenotrophomonas maltophilia NOT DETECTED NOT DETECTED Final   Candida albicans NOT DETECTED NOT DETECTED Final   Candida auris NOT DETECTED NOT DETECTED Final   Candida glabrata NOT DETECTED NOT DETECTED Final   Candida krusei NOT DETECTED NOT DETECTED Final   Candida parapsilosis NOT DETECTED NOT DETECTED Final    Candida tropicalis NOT DETECTED NOT DETECTED Final   Cryptococcus neoformans/gattii NOT DETECTED NOT DETECTED Final   CTX-M ESBL DETECTED (A) NOT DETECTED Final    Comment: CRITICAL RESULT CALLED TO, READ BACK BY AND VERIFIED WITH: PHARMD T.GREEN AT 1000 ON 11/30/2023 BY T.SAAD. (NOTE)  Extended spectrum beta-lactamase detected. Recommend a carbapenem as initial therapy.      Carbapenem resistance IMP NOT DETECTED NOT DETECTED Final   Carbapenem resistance KPC NOT DETECTED NOT DETECTED Final   Carbapenem resistance NDM NOT DETECTED NOT DETECTED Final   Carbapenem resist OXA 48 LIKE NOT DETECTED NOT DETECTED Final   Carbapenem resistance VIM NOT DETECTED NOT DETECTED Final    Comment: Performed at [facility], [address], [place], [place] [address]         Radiology Studies: US RENAL Result Date: 11/29/2023 CLINICAL DATA:  Hematuria. EXAM: RENAL / URINARY TRACT ULTRASOUND COMPLETE COMPARISON:  CT 01/01/2022 FINDINGS: Right Kidney: Renal measurements: 14.4 x 5.4 x 6 cm = volume: 246 mL. Moderate hydronephrosis. Mild thinning of the renal parenchyma. No visualized stone or focal lesion. Left Kidney: Renal measurements: 12.3 x 6.3 x 4.3 cm = volume: 175 mL. Kidney is poorly assessed on the current exam due to overlying shadowing. No hydronephrosis. No obvious focal lesion or stone. Bladder: Moderately distended. There is intraluminal debris as well as diffuse wall thickening. Neither ureteral jet is demonstrated. Other: None. IMPRESSION: 1. Moderate right hydronephrosis. Slight thinning of the right renal parenchyma. 2. No left hydronephrosis. More detailed left renal assessment is limited. 3. Moderate bladder distension with debris and wall thickening. Electronically Signed   By: Narda Rutherford M.D.   On: 11/29/2023 22:43   CT HEAD WO CONTRAST ( ) Result Date: 11/29/2023 CLINICAL DATA:  Altered mental status EXAM: CT HEAD WITHOUT CONTRAST TECHNIQUE: Contiguous axial images were  obtained from the base of the skull through the vertex without intravenous contrast. RADIATION DOSE REDUCTION: This exam was performed according to the departmental dose-optimization program which includes automated exposure control, adjustment of the mA and/or kV according to patient size and/or use of iterative reconstruction technique. COMPARISON:  None Available. FINDINGS: Brain: There is no mass, hemorrhage or extra-axial collection. The size and configuration of the ventricles and extra-axial CSF spaces are normal. There is hypoattenuation of the white matter, most commonly indicating chronic small vessel disease. Vascular: Atherosclerotic calcification of the internal carotid arteries at the skull base. No abnormal hyperdensity of the major intracranial arteries or dural venous sinuses. Skull: The visualized skull base, calvarium and extracranial soft tissues are normal. Sinuses/Orbits: No fluid levels or advanced mucosal thickening of the visualized paranasal sinuses. No mastoid or middle ear effusion. Normal orbits. Other: None. IMPRESSION: 1. No acute intracranial abnormality. 2. Chronic small vessel disease. Electronically Signed   By: Deatra Robinson M.D.   On: 11/29/2023 19:38        Scheduled Meds:  Carbidopa-Levodopa ER  1 tablet Oral Q2000   Carbidopa-Levodopa ER  2 tablet Oral TID PC   DULoxetine  20 mg Oral q morning   feeding supplement  237 mL Oral BID BM   levothyroxine  100 mcg Oral Q0600   metoprolol tartrate  12.5 mg Oral BID   rosuvastatin  10 mg Oral QPM   senna-docusate  1 tablet Oral QHS   Continuous Infusions:  meropenem (MERREM) IV 1 g (11/30/23 0603)     LOS: 2 days    Time spent: 52 minutes spent on 11/30/2023 caring for this patient face-to-face including chart review, ordering labs/tests, documenting, discussion with nursing staff, consultants, updating family and interview/physical exam    Alvira Philips [place], DO [facility] Available via Epic secure  chat 7am-7pm After these hours, please refer to coverage provider listed on [email or web address] 11/30/2023, 12:44 PM

## 2023-11-30 NOTE — Plan of Care (Signed)

## 2023-11-30 NOTE — Progress Notes (Signed)
 Patient Mattress/ Bed was change to low air loss

## 2023-11-30 NOTE — Progress Notes (Addendum)
 PT Cancellation Note  Patient Details Name: DOMITILA STETLER MRN: [id number] DOB: 1951-04-24   Cancelled Treatment:    Reason Eval/Treat Not Completed: Other (comment) Nursing staff assisting pt to low air loss mattress bed.  Will check back as schedule permits.   Janan Halter [place] 11/30/2023, 11:52 AM Paulino Door, DPT Physical Therapist Acute Rehabilitation Services Office: [phone]

## 2023-11-30 NOTE — Evaluation (Signed)
 Occupational Therapy Evaluation Patient Details Name: Anna Bernard MRN: [id number] DOB: [birth year] Today's Date: 11/30/2023   History of Present Illness   Anna Bernard is a 73 y.o. female admitted 11/28/23 from home due to AMS, weakness, somnolence, decreased oral intake and inability to help with transfers. Dx with Acute metabolic encephalopathy and UTI. PMH includes anemia, CAD, hyperlipidemia, HTN, hypothyroidism, paroxysmal atrial fibrillation, prediabetes, PE, vascular parkinson disease and dementia, frequent syncope.     Clinical Impressions Pt is typically hoyer lift to transport chair, has a hospital bed, has not been walking. Pt likes to assist, but is largely dependent on husband/family/caregiver for ADL. Pt today is weaker than normal and demonstrating decreased balance, activity tolerance. L arm weaker than R (dominant), able to sit EOB with max A +2 for bed mobility to participate in EOB basic exercises for UB and LB with extra time and effort. Due to air mattress, started slipping towards edge so returned supine at max A +2. Then practiced rolling at max A +2 with assist from bed pad to simulate home environment. OT recommending continued skilled OT acutely to address generalized weakness through HEP and functional ADL activity tolerance. At this time family politely declining HHOT - they have appropriate DME and have had poor experiences with HH therapies in the past.      If plan is discharge home, recommend the following:   Two people to help with walking and/or transfers;A lot of help with bathing/dressing/bathroom;Assistance with cooking/housework;Assistance with feeding;Assist for transportation     Functional Status Assessment   Patient has had a recent decline in their functional status and demonstrates the ability to make significant improvements in function in a reasonable and predictable amount of time.     Equipment Recommendations   None recommended by OT  (Pt/family with appropriate DME)     Recommendations for Other Services   PT consult     Precautions/Restrictions   Precautions Precautions: Fall Precaution/Restrictions Comments: parkinsons, dementia, frequent syncope Restrictions Weight Bearing Restrictions Per Provider Order: No     Mobility Bed Mobility Overal bed mobility: Needs Assistance Bed Mobility: Supine to Sit, Sit to Supine     Supine to sit: +2 for physical assistance, +2 for safety/equipment, Max assist Sit to supine: +2 for physical assistance, +2 for safety/equipment, Max assist   General bed mobility comments: Pt initiates and tries to help, extar time and effort - but ultimately max A +2    Transfers                   General transfer comment: Nt this session      Balance Overall balance assessment: Needs assistance Sitting-balance support: Single extremity supported, Bilateral upper extremity supported, Feet unsupported (too short to reach floor from air matttress) Sitting balance-Leahy Scale: Poor   Postural control: Posterior lean                                 ADL either performed or assessed with clinical judgement   ADL Overall ADL's : Needs assistance/impaired;At baseline Eating/Feeding: Minimal assistance   Grooming: Moderate assistance   Upper Body Bathing: Maximal assistance   Lower Body Bathing: Maximal assistance   Upper Body Dressing : Maximal assistance   Lower Body Dressing: Maximal assistance   Toilet Transfer: Total assistance (lift)   Toileting- Clothing Manipulation and Hygiene: Maximal assistance;Bed level       Functional mobility during ADLs:  Maximal assistance;Total assistance (lift at baseline)       Vision Patient Visual Report: No change from baseline       Perception         Praxis         Pertinent Vitals/Pain Pain Assessment Pain Assessment: Faces Faces Pain Scale: No hurt Pain Intervention(s): Monitored during  session     Extremity/Trunk Assessment Upper Extremity Assessment Upper Extremity Assessment: Generalized weakness (L weaker than R)   Lower Extremity Assessment Lower Extremity Assessment: Defer to PT evaluation       Communication Communication Communication: Impaired Factors Affecting Communication: Other (comment);Reduced clarity of speech (soft spoken)   Cognition Arousal: Alert Behavior During Therapy: WFL for tasks assessed/performed, Flat affect Cognition: History of cognitive impairments                               Following commands: Intact (extra time)       Cueing  General Comments   Cueing Techniques: Verbal cues;Gestural cues  daughter present and supportive   Exercises     Shoulder Instructions      Home Living Family/patient expects to be discharged to:: Private residence Living Arrangements: Spouse/significant other Available Help at Discharge: Family;Available 24 hours/day Type of Home: House Home Access: Ramped entrance     Home Layout: Two level;Full bath on main level;Able to live on main level with bedroom/bathroom     Bathroom Shower/Tub: Producer, television/film/video: Standard Bathroom Accessibility: No   Home Equipment: Rollator (4 wheels);Rolling Walker (2 wheels);BSC/3in1;Shower seat;Transport chair;Hospital bed;Lift chair (hoyer)          Prior Functioning/Environment Prior Level of Function : Needs assist             Mobility Comments: transfers only, gets pushed around in transport chair within the home and for MD appointments. Have a hoyer lift ADLs Comments: will assist at bed level, but max A for all aspects, purewick at home, diapers, bed bath    OT Problem List: Decreased activity tolerance;Impaired balance (sitting and/or standing)   OT Treatment/Interventions: Self-care/ADL training;Therapeutic exercise;Patient/family education;Therapeutic activities      OT Goals(Current goals can be  found in the care plan section)   Acute Rehab OT Goals Patient Stated Goal: get stronger OT Goal Formulation: With family Time For Goal Achievement: 12/14/23 Potential to Achieve Goals: Good   OT Frequency:  Min 1X/week    Co-evaluation PT/OT/SLP Co-Evaluation/Treatment: Yes Reason for Co-Treatment: Complexity of the patient's impairments (multi-system involvement);For patient/therapist safety;To address functional/ADL transfers PT goals addressed during session: Mobility/safety with mobility;Strengthening/ROM;Balance OT goals addressed during session: ADL's and self-care;Strengthening/ROM      AM-PAC OT "6 Clicks" Daily Activity     Outcome Measure Help from another person eating meals?: A Little Help from another person taking care of personal grooming?: A Lot Help from another person toileting, which includes using toliet, bedpan, or urinal?: Total Help from another person bathing (including washing, rinsing, drying)?: Total Help from another person to put on and taking off regular upper body clothing?: Total Help from another person to put on and taking off regular lower body clothing?: Total 6 Click Score: 9   End of Session Nurse Communication: Mobility status  Activity Tolerance: Patient tolerated treatment well Patient left: in bed;with call bell/phone within reach;with family/visitor present  OT Visit Diagnosis: Muscle weakness (generalized) (M62.81)  Time: 9562-1308 OT Time Calculation (min): 23 min Charges:  OT General Charges $OT Visit: 1 Visit OT Evaluation $OT Eval Moderate Complexity: 1 Mod  Nyoka Cowden OTR/L Acute Rehabilitation Services Office: [phone]  Emelda Fear 11/30/2023, 2:15 PM

## 2023-11-30 NOTE — Progress Notes (Signed)
 PHARMACY - PHYSICIAN COMMUNICATION CRITICAL VALUE ALERT - BLOOD CULTURE IDENTIFICATION (BCID)  Anna Bernard is an 73 y.o. female who presented to [facility] on 11/28/2023 with a chief complaint of AMS & UTI  Assessment:  1/4 BCx bottles growing CTX-M+ E coli (likely urinary source given recent ESBL E coli UTI)  Name of physician (or Provider) Contacted: Lucretia Field  Current antibiotics: Meropenem 1g q8 hr  Changes to prescribed antibiotics recommended:  Patient is on recommended antibiotics - No changes needed  Results for orders placed or performed during the hospital encounter of 11/28/23  Blood Culture ID Panel (Reflexed) (Collected: 11/28/2023 10:25 AM)  Result Value Ref Range   Enterococcus faecalis NOT DETECTED NOT DETECTED   Enterococcus Faecium NOT DETECTED NOT DETECTED   Listeria monocytogenes NOT DETECTED NOT DETECTED   Staphylococcus species NOT DETECTED NOT DETECTED   Staphylococcus aureus (BCID) NOT DETECTED NOT DETECTED   Staphylococcus epidermidis NOT DETECTED NOT DETECTED   Staphylococcus lugdunensis NOT DETECTED NOT DETECTED   Streptococcus species NOT DETECTED NOT DETECTED   Streptococcus agalactiae NOT DETECTED NOT DETECTED   Streptococcus pneumoniae NOT DETECTED NOT DETECTED   Streptococcus pyogenes NOT DETECTED NOT DETECTED   A.calcoaceticus-baumannii NOT DETECTED NOT DETECTED   Bacteroides fragilis NOT DETECTED NOT DETECTED   Enterobacterales DETECTED (A) NOT DETECTED   Enterobacter cloacae complex NOT DETECTED NOT DETECTED   Escherichia coli DETECTED (A) NOT DETECTED   Klebsiella aerogenes NOT DETECTED NOT DETECTED   Klebsiella oxytoca NOT DETECTED NOT DETECTED   Klebsiella pneumoniae NOT DETECTED NOT DETECTED   Proteus species NOT DETECTED NOT DETECTED   Salmonella species NOT DETECTED NOT DETECTED   Serratia marcescens NOT DETECTED NOT DETECTED   Haemophilus influenzae NOT DETECTED NOT DETECTED   Neisseria meningitidis NOT DETECTED NOT DETECTED    Pseudomonas aeruginosa NOT DETECTED NOT DETECTED   Stenotrophomonas maltophilia NOT DETECTED NOT DETECTED   Candida albicans NOT DETECTED NOT DETECTED   Candida auris NOT DETECTED NOT DETECTED   Candida glabrata NOT DETECTED NOT DETECTED   Candida krusei NOT DETECTED NOT DETECTED   Candida parapsilosis NOT DETECTED NOT DETECTED   Candida tropicalis NOT DETECTED NOT DETECTED   Cryptococcus neoformans/gattii NOT DETECTED NOT DETECTED   CTX-M ESBL DETECTED (A) NOT DETECTED   Carbapenem resistance IMP NOT DETECTED NOT DETECTED   Carbapenem resistance KPC NOT DETECTED NOT DETECTED   Carbapenem resistance NDM NOT DETECTED NOT DETECTED   Carbapenem resist OXA 48 LIKE NOT DETECTED NOT DETECTED   Carbapenem resistance VIM NOT DETECTED NOT DETECTED    Ceferino Lang A 11/30/2023  10:06 AM

## 2023-11-30 NOTE — Evaluation (Signed)
 Physical Therapy Evaluation Patient Details Name: Anna Bernard MRN: [id number] DOB: 1950/09/24 Today's Date: 11/30/2023  History of Present Illness  Anna Bernard is a 73 y.o. female admitted 11/28/23 from home due to AMS, weakness, somnolence, decreased oral intake and inability to help with transfers. Dx with Acute metabolic encephalopathy and UTI. PMH includes anemia, CAD, hyperlipidemia, HTN, hypothyroidism, paroxysmal atrial fibrillation, prediabetes, PE, vascular parkinson disease and dementia, frequent syncope.  Clinical Impression  Pt admitted with above diagnosis.  Pt currently with functional limitations due to the deficits listed below (see PT Problem List). Pt will benefit from acute skilled PT to increase their independence and safety with mobility to allow discharge.  Pt cooperative and attempting to assist as able. Daughter present today and reports pt typically is able to assist with bed mobility for changing linen and pericare, lives with spouse, has caregiver approx 3 hrs daily, and utilizes hoyer lift for OOB to transport chair.  Daughter reports bad experience with prior HHPT so family declines HHPT at this time.  Daughter anticipates pt to return home with spouse upon d/c.         If plan is discharge home, recommend the following: Assistance with cooking/housework;Two people to help with walking and/or transfers;A lot of help with bathing/dressing/bathroom;Help with stairs or ramp for entrance   Can travel by private vehicle        Equipment Recommendations None recommended by PT  Recommendations for Other Services       Functional Status Assessment Patient has had a recent decline in their functional status and demonstrates the ability to make significant improvements in function in a reasonable and predictable amount of time.     Precautions / Restrictions Precautions Precautions: Fall Precaution/Restrictions Comments: parkinsons, dementia, frequent  syncope Restrictions Weight Bearing Restrictions Per Provider Order: No      Mobility  Bed Mobility Overal bed mobility: Needs Assistance Bed Mobility: Supine to Sit, Sit to Supine, Rolling Rolling: Used rails, Mod assist   Supine to sit: +2 for physical assistance, +2 for safety/equipment, Max assist Sit to supine: +2 for physical assistance, +2 for safety/equipment, Max assist   General bed mobility comments: Pt initiates and tries to help, extra time and effort, requiring max A +2; pt able to assist better with rolling but still requiring mod assist to complete roll    Transfers                   General transfer comment: uses hoyer lift at home    Ambulation/Gait                  Stairs            Wheelchair Mobility     Tilt Bed    Modified Rankin (Stroke Patients Only)       Balance Overall balance assessment: Needs assistance Sitting-balance support: Single extremity supported, Bilateral upper extremity supported, Feet unsupported Sitting balance-Leahy Scale: Poor Sitting balance - Comments: low air loss mattress so pt not able to reach floor with feet, posterior lean requiring support at EOB; one therapist blocked knees for safety Postural control: Posterior lean                                   Pertinent Vitals/Pain Pain Assessment Pain Assessment: Faces Faces Pain Scale: No hurt Pain Location: points near purewick - but denies discomfort once intake progressed Pain  Intervention(s): Repositioned, Monitored during session    Home Living Family/patient expects to be discharged to:: Private residence Living Arrangements: Spouse/significant other Available Help at Discharge: Family;Available 24 hours/day;Personal care attendant (4 times a week for 3 hours) Type of Home: House Home Access: Ramped entrance       Home Layout: Two level;Full bath on main level;Able to live on main level with bedroom/bathroom Home  Equipment: Rollator (4 wheels);Rolling Walker (2 wheels);BSC/3in1;Shower seat;Transport chair;Hospital bed;Lift chair (hoyer)      Prior Function Prior Level of Function : Needs assist             Mobility Comments: transfers only, gets pushed around in transport chair within the home and for MD appointments.  hoyer lift for OOB ADLs Comments: will assist at bed level, but max A for all aspects, purewick at home, diapers, bed bath     Extremity/Trunk Assessment   Upper Extremity Assessment Upper Extremity Assessment: Generalized weakness (L weaker than R)    Lower Extremity Assessment Lower Extremity Assessment: Generalized weakness       Communication   Communication Communication: Impaired Factors Affecting Communication: Other (comment);Reduced clarity of speech (soft spoken)    Cognition Arousal: Alert Behavior During Therapy: WFL for tasks assessed/performed, Flat affect   PT - Cognitive impairments: History of cognitive impairments                         Following commands: Intact (extra time)       Cueing Cueing Techniques: Verbal cues, Gestural cues     General Comments General comments (skin integrity, edema, etc.): daughter present and supportive    Exercises     Assessment/Plan    PT Assessment Patient needs continued PT services  PT Problem List Decreased strength;Decreased mobility;Decreased balance;Decreased skin integrity       PT Treatment Interventions DME instruction;Functional mobility training;Patient/family education;Wheelchair mobility training;Therapeutic exercise;Therapeutic activities;Balance training;Neuromuscular re-education    PT Goals (Current goals can be found in the Care Plan section)  Acute Rehab PT Goals PT Goal Formulation: With patient/family Time For Goal Achievement: 12/14/23 Potential to Achieve Goals: Fair    Frequency Min 2X/week     Co-evaluation PT/OT/SLP Co-Evaluation/Treatment: Yes Reason  for Co-Treatment: Complexity of the patient's impairments (multi-system involvement);For patient/therapist safety;To address functional/ADL transfers PT goals addressed during session: Mobility/safety with mobility;Strengthening/ROM;Balance OT goals addressed during session: ADL's and self-care;Strengthening/ROM       AM-PAC PT "6 Clicks" Mobility  Outcome Measure Help needed turning from your back to your side while in a flat bed without using bedrails?: A Lot Help needed moving from lying on your back to sitting on the side of a flat bed without using bedrails?: A Lot Help needed moving to and from a bed to a chair (including a wheelchair)?: Total Help needed standing up from a chair using your arms (e.g., wheelchair or bedside chair)?: Total Help needed to walk in hospital room?: Total Help needed climbing 3-5 steps with a railing? : Total 6 Click Score: 8    End of Session Equipment Utilized During Treatment: Gait belt Activity Tolerance: Patient tolerated treatment well Patient left: in bed;with call bell/phone within reach;with family/visitor present   PT Visit Diagnosis: Muscle weakness (generalized) (M62.81)    Time: 1346-1400 PT Time Calculation (min) (ACUTE ONLY): 14 min   Charges:   PT Evaluation $PT Eval Low Complexity: 1 Low   PT General Charges $$ ACUTE PT VISIT: 1 Visit  Paulino Door, DPT Physical Therapist Acute Rehabilitation Services Office: [phone]  Janan Halter P[place] 11/30/2023, 2:50 PM

## 2023-11-30 NOTE — Plan of Care (Signed)

## 2023-12-01 DIAGNOSIS — N390 Urinary tract infection, site not specified: Secondary | ICD-10-CM | POA: Diagnosis not present

## 2023-12-01 LAB — BASIC METABOLIC PANEL
Anion gap: 7 (ref 5–15)
BUN: 16 mg/dL (ref 8–23)
CO2: 24 mmol/L (ref 22–32)
Calcium: 8.2 mg/dL — ABNORMAL LOW (ref 8.9–10.3)
Chloride: 109 mmol/L (ref 98–111)
Creatinine, Ser: 0.53 mg/dL (ref 0.44–1.00)
GFR, Estimated: >60 mL/min (ref >60)
Glucose, Bld: 88 mg/dL (ref 70–99)
Potassium: 3.6 mmol/L (ref 3.5–5.1)
Sodium: 140 mmol/L (ref 135–145)

## 2023-12-01 LAB — CBC
HCT: 30.1 % — ABNORMAL LOW (ref 36.0–46.0)
Hemoglobin: 9.9 g/dL — ABNORMAL LOW (ref 12.0–15.0)
MCH: 26.3 pg (ref 26.0–34.0)
MCHC: 32.9 g/dL (ref 30.0–36.0)
MCV: 80.1 fL (ref 80.0–100.0)
Platelets: 333 10*3/uL (ref 150–400)
RBC: 3.76 MIL/uL — ABNORMAL LOW (ref 3.87–5.11)
RDW: 13.9 % (ref 11.5–15.5)
WBC: 9.6 10*3/uL (ref 4.0–10.5)
nRBC: 0 % (ref 0.0–0.2)

## 2023-12-01 LAB — GLUCOSE, CAPILLARY
Glucose-Capillary: 82 mg/dL (ref 70–99)
Glucose-Capillary: 84 mg/dL (ref 70–99)
Glucose-Capillary: 94 mg/dL (ref 70–99)
Glucose-Capillary: 119 mg/dL — ABNORMAL HIGH (ref 70–99)
Glucose-Capillary: 109 mg/dL — ABNORMAL HIGH (ref 70–99)

## 2023-12-01 MED ORDER — POTASSIUM CHLORIDE 20 MEQ PO PACK
40.0000 meq | PACK | Freq: Once | ORAL | Status: AC
  Administered 2023-12-01: 40 meq via ORAL
  Filled 2023-12-01: qty 2

## 2023-12-01 NOTE — Progress Notes (Signed)
 PROGRESS NOTE    Anna Bernard  WUX:[id number] DOB: 06-Jul-1951 DOA: 11/28/2023 PCP: Billey Co, MD    Brief Narrative:   Anna Bernard is a 73 y.o. female with past medical history significant for HTN, HLD, CAD, hypothyroidism, paroxysmal atrial fibrillation no longer on anticoagulation, history of PE, hypothyroid, anemia, Parkinson's disease, vitamin B12 deficiency, right subclavian artery stenosis, who presented to [facility] ED via EMS from home for progressive weakness/lethargy, confusion.  Family reports decreased urine output and oral intake.  Patient unable to provide further information and only follows simple commands.  In the ED, temperature 100.5 F, HR 86, RR 23, BP 103/62, SpO2 95% on room air.  WBC 12.0, hemoglobin 9.2, platelet count 243.  Sodium 143, potassium 3.6, chloride 115, CO2 18, glucose 96, BUN 39, creatinine 0.71.  AST 23, ALT 7, total bilirubin 0.9.  High sensitive troponin 12.  Lactic acid 0.7> 0.8.  Urinalysis with small leukocytes, negative nitrite, greater than 300 protein, many bacteria, greater than 50 RBCs, greater than 50 WBCs.  Chest x-ray with no active cardiopulmonary disease process.  Blood cultures x 2 and urine culture obtained. The patient received 2000 mL of normal saline bolus, ceftriaxone 2 g IVPB, calcium gluconate 1 g IVPB and acetaminophen 650 mg suppository PR x 1.  Given history of ESBL, patient was started on meropenem.  TRH consulted for admission for further evaluation management of acute metabolic encephalopathy likely secondary to urinary tract infection.  Assessment & Plan:   Acute metabolic encephalopathy ESBL E. coli bacteremia E. coli/Proteus mirabilis UTI Hemorrhagic cystitis Patient presenting to ED with progressive weakness, lethargy, confusion.  Also with decreased oral intake, urine output.  History of previous ESBL E. coli urinary tract infections per review of MAR.  Patient with temperature 100.5 F on mission.  WBC  count elevated 20.0 with normal lactic acid.  Urinalysis consistent with UTI.  CT head without contrast with no acute intracranial abnormality. -- WBC 12.0>13.9>18.1>9.6 -- Blood cultures x 2: + GNR, BCID + ESBL Ecoli; susceptibilities pending -- Urine culture: + Ecoli/Proteus Mirabilis; susceptibilities pending -- Meropenem 1 g IV every 8 hours; plan 10 day course (Day #4) -- CBC daily; monitor urine output, bladder scan as needed  Dysphagia -- Seen by speech therapy -- Dysphagia 3 diet -- Aspiration precautions; assist with feeding  Essential hypertension Paroxysmal atrial fibrillation no longer on anticoagulation -- Metoprolol tartrate 12.5 mg p.o. twice daily  Hyperlipidemia CAD -- Crestor 10 mg p.o. daily  Hypothyroidism -- Levothyroxine 100 mcg p.o. daily  History of anemia History of small bowel AVM -- Hgb 9.2>11.0>10.5>9.4 -- Transfuse for hemoglobin less than 7.0 -- CBC daily  GERD -- Protonix 40 mg p.o. daily as needed heartburn  History of syncope  Seen by cardiology previously, thought to be secondary to orthostasis; probably as well as autonomic dysfunction with history of Parkinson's disease. -- Supportive care, fall precautions  Parkinson disease with dementia Follows with neurology outpatient, Dr. Arbutus Leas.  Last seen in clinic on 08/30/2023.  Per neurology note, no ambulates much and memory has been very poor. -- Sinemet 4 times daily -- Melatonin 3 mg p.o. nightly -- Delirium precautions -- Get up during the day -- Encourage a familiar face to remain present throughout the day -- Keep blinds open and lights on during daylight hours -- Minimize the use of opioids/benzodiazepines  Weakness/debility/deconditioning: -- PT/OT recommend home health PT/OT -- Continue therapy efforts while inpatient   Pressure injury, right buttock, stage  I sacrum, ischial tuberosity, POA Pressure Injury 11/29/23 Buttocks Right Deep Tissue Pressure Injury - Purple or maroon  localized area of discolored intact skin or blood-filled blister due to damage of underlying soft tissue from pressure and/or shear. unblanchable area, dark in color w (Active)  11/29/23 0700  Location: Buttocks  Location Orientation: Right  Staging: Deep Tissue Pressure Injury - Purple or maroon localized area of discolored intact skin or blood-filled blister due to damage of underlying soft tissue from pressure and/or shear.  Wound Description (Comments): unblanchable area, dark in color with blistered skin  Present on Admission: Yes     Pressure Injury 11/29/23 Sacrum Mid Stage 1 -  Intact skin with non-blanchable redness of a localized area usually over a bony prominence. (Active)  11/29/23 0700  Location: Sacrum  Location Orientation: Mid  Staging: Stage 1 -  Intact skin with non-blanchable redness of a localized area usually over a bony prominence.  Wound Description (Comments):   Present on Admission: Yes     Pressure Injury 11/29/23 Ischial tuberosity Right Deep Tissue Pressure Injury - Purple or maroon localized area of discolored intact skin or blood-filled blister due to damage of underlying soft tissue from pressure and/or shear. (Active)  11/29/23 0700  Location: Ischial tuberosity  Location Orientation: Right  Staging: Deep Tissue Pressure Injury - Purple or maroon localized area of discolored intact skin or blood-filled blister due to damage of underlying soft tissue from pressure and/or shear.  Wound Description (Comments):   Present on Admission: Yes     Pressure Injury Ischial tuberosity Left Deep Tissue Pressure Injury - Purple or maroon localized area of discolored intact skin or blood-filled blister due to damage of underlying soft tissue from pressure and/or shear. (Active)     Location: Ischial tuberosity  Location Orientation: Left  Staging: Deep Tissue Pressure Injury - Purple or maroon localized area of discolored intact skin or blood-filled blister due to damage  of underlying soft tissue from pressure and/or shear.  Wound Description (Comments):   Present on Admission: Yes  Seen by wound RN, continue local wound care, offloading, air mattress     DVT prophylaxis: SCDs Start: 11/28/23 1400    Code Status: Full Code Family Communication: No family present at bedside this morning  Disposition Plan:  Level of care: Med-Surg Status is: Inpatient Remains inpatient appropriate because: IV antibiotics, pending PT/OT evaluation, further workup    Consultants:  None  Procedures:  None  Antimicrobials:  Ceftriaxone 3/11 - 3/11 Meropenem 3/11>>   Subjective: Patient seen examined bedside, lying in bed.  Eating breakfast.  No family present this morning.  Much more alert and conversant but remains pleasantly confused.Denies pain, no shortness of breath, no chest pain.  No acute concerns overnight per nursing staff.  Objective: Vitals:   11/30/23 1412 11/30/23 1951 12/01/23 0448 12/01/23 1235  BP: (!) 153/78 117/65 (!) 151/85 (!) 155/87  Pulse: 81 87 79 76  Resp: 20 16 19 20   Temp: 97.9 F (36.6 C) 98.4 F (36.9 C) 98.3 F (36.8 C) 98.6 F (37 C)  TempSrc: Oral Oral Oral   SpO2: 100% 99% 98% 100%  Weight:      Height:        Intake/Output Summary (Last 24 hours) at 12/01/2023 1252 Last data filed at 12/01/2023 0900 Gross per 24 hour  Intake 360 ml  Output 1100 ml  Net -740 ml   Filed Weights   11/29/23 1216  Weight: 72.3 kg    Examination:  Physical Exam: GEN: NAD, weak/lethargic, chronically ill appearance, appears older than stated age HEENT: NCAT, PERRL, EOMI, sclera clear, dry mucous membranes PULM: CTAB w/o wheezes/crackles, normal respiratory effort, on room air CV: RRR w/o M/G/R GI: abd soft, NTND, + BS MSK: no peripheral edema Integumentary: No rashes/lesions/wounds nonexposed skin surfaces    Data Reviewed: I have personally reviewed following labs and imaging studies  CBC: Recent Labs  Lab  11/28/23 0944 11/29/23 0419 11/30/23 0431 12/01/23 0339  WBC 12.0* 13.9* 10.7* 9.6  NEUTROABS 9.9*  --   --   --   HGB 9.2* 11.0* 10.5* 9.9*  HCT 27.6* 33.8* 31.3* 30.1*  MCV 81.2 83.0 80.1 80.1  PLT 243 278 294 333   Basic Metabolic Panel: Recent Labs  Lab 11/28/23 0944 11/29/23 0419 11/30/23 0431 12/01/23 0339  NA 143 144 142 140  K 3.6 4.4 3.8 3.6  CL 115* 111 111 109  CO2 18* 20* 25 24  GLUCOSE 96 79 119* 88  BUN 39* 33* 29* 16  CREATININE 0.71 0.87 0.61 0.53  CALCIUM 6.5* 8.4* 8.3* 8.2*  MG  --   --  2.1  --   PHOS  --   --  2.0*  --    GFR: Estimated Creatinine Clearance: 60.1 mL/min (by C-G formula based on SCr of 0.53 mg/dL). Liver Function Tests: Recent Labs  Lab 11/28/23 0944 11/29/23 0419  AST 23 29  ALT 7 7  ALKPHOS 74 87  BILITOT 0.9 0.9  PROT 5.5* 6.4*  ALBUMIN 2.2* 2.5*   No results for input(s): "LIPASE", "AMYLASE" in the last 168 hours. No results for input(s): "AMMONIA" in the last 168 hours. Coagulation Profile: No results for input(s): "INR", "PROTIME" in the last 168 hours. Cardiac Enzymes: No results for input(s): "CKTOTAL", "CKMB", "CKMBINDEX", "TROPONINI" in the last 168 hours. BNP (last 3 results) No results for input(s): "PROBNP" in the last 8760 hours. HbA1C: Recent Labs    11/29/23 0419  HGBA1C 5.0   CBG: Recent Labs  Lab 11/30/23 1610 11/30/23 2106 12/01/23 0810 12/01/23 1120 12/01/23 1232  GLUCAP 138* 94 82 84 94   Lipid Profile: No results for input(s): "CHOL", "HDL", "LDLCALC", "TRIG", "CHOLHDL", "LDLDIRECT" in the last 72 hours. Thyroid Function Tests: Recent Labs    11/30/23 0431  TSH 3.198  FREET4 0.94   Anemia Panel: Recent Labs    11/29/23 1503  VITAMINB12 565  FOLATE 29.3  FERRITIN 258  TIBC 176*  IRON 20*  RETICCTPCT 0.6   Sepsis Labs: Recent Labs  Lab 11/28/23 1033 11/28/23 1200  LATICACIDVEN 0.7 0.8    Recent Results (from the past 240 hours)  Resp panel by RT-PCR (RSV, Flu A&B,  Covid)     Status: None   Collection Time: 11/28/23  9:20 AM   Specimen: Nasal Swab  Result Value Ref Range Status   SARS Coronavirus 2 by RT PCR NEGATIVE NEGATIVE Final    Comment: (NOTE) SARS-CoV-2 target nucleic acids are NOT DETECTED.  The SARS-CoV-2 RNA is generally detectable in upper respiratory specimens during the acute phase of infection. The lowest concentration of SARS-CoV-2 viral copies this assay can detect is 138 copies/mL. A negative result does not preclude SARS-Cov-2 infection and should not be used as the sole basis for treatment or other patient management decisions. A negative result may occur with  improper specimen collection/handling, submission of specimen other than nasopharyngeal swab, presence of viral mutation(s) within the areas targeted by this assay, and inadequate number of viral  copies(<138 copies/mL). A negative result must be combined with clinical observations, patient history, and epidemiological information. The expected result is Negative.  Fact Sheet for Patients:  BloggerCourse.com  Fact Sheet for Healthcare Providers:  SeriousBroker.it  This test is no t yet approved or cleared by the [place] FDA and  has been authorized for detection and/or diagnosis of SARS-CoV-2 by FDA under an Emergency Use Authorization (EUA). This EUA will remain  in effect (meaning this test can be used) for the duration of the COVID-19 declaration under Section 564(b)(1) of the Act, 21 U.S.C.section 360bbb-3(b)(1), unless the authorization is terminated  or revoked sooner.       Influenza A by PCR NEGATIVE NEGATIVE Final   Influenza B by PCR NEGATIVE NEGATIVE Final    Comment: (NOTE) The Xpert Xpress SARS-CoV-2/FLU/RSV plus assay is intended as an aid in the diagnosis of influenza from Nasopharyngeal swab specimens and should not be used as a sole basis for treatment. Nasal washings and aspirates are  unacceptable for Xpert Xpress SARS-CoV-2/FLU/RSV testing.  Fact Sheet for Patients: BloggerCourse.com  Fact Sheet for Healthcare Providers: SeriousBroker.it  This test is not yet approved or cleared by the [place] FDA and has been authorized for detection and/or diagnosis of SARS-CoV-2 by FDA under an Emergency Use Authorization (EUA). This EUA will remain in effect (meaning this test can be used) for the duration of the COVID-19 declaration under Section 564(b)(1) of the Act, 21 U.S.C. section 360bbb-3(b)(1), unless the authorization is terminated or revoked.     Resp Syncytial Virus by PCR NEGATIVE NEGATIVE Final    Comment: (NOTE) Fact Sheet for Patients: BloggerCourse.com  Fact Sheet for Healthcare Providers: SeriousBroker.it  This test is not yet approved or cleared by the [place] FDA and has been authorized for detection and/or diagnosis of SARS-CoV-2 by FDA under an Emergency Use Authorization (EUA). This EUA will remain in effect (meaning this test can be used) for the duration of the COVID-19 declaration under Section 564(b)(1) of the Act, 21 U.S.C. section 360bbb-3(b)(1), unless the authorization is terminated or revoked.  Performed at [facility], [address], [place], [place] [address]   Urine Culture (for pregnant, neutropenic or urologic patients or patients with an indwelling urinary catheter)     Status: Abnormal (Preliminary result)   Collection Time: 11/28/23  9:44 AM   Specimen: Urine, Clean Catch  Result Value Ref Range Status   Specimen Description   Final    URINE, CLEAN CATCH Performed at [facility], [address] [place], [place] [address]    Special Requests   Final    NONE Performed at [facility], [address] [place], [place] [address]    Culture (A)  Final     90,000 COLONIES/mL PROTEUS MIRABILIS >=100,000 COLONIES/mL ESCHERICHIA COLI SUSCEPTIBILITIES TO FOLLOW Performed at [facility], [address] [place], [place] [address]    Report Status PENDING  Incomplete  Culture, blood (routine x 2)     Status: None (Preliminary result)   Collection Time: 11/28/23 10:25 AM   Specimen: BLOOD RIGHT FOREARM  Result Value Ref Range Status   Specimen Description   Final    BLOOD RIGHT FOREARM Performed at [facility], [address] [place], [place] [address]    Special Requests   Final    BOTTLES DRAWN AEROBIC AND ANAEROBIC Blood Culture adequate volume Performed at [facility], [address] [place], [place]  [address]    Culture   Final    NO GROWTH 3 DAYS Performed at [facility], [address] [place], [place] [address]    Report Status PENDING  Incomplete  Culture, blood (routine x 2)     Status: Abnormal (Preliminary result)   Collection Time: 11/28/23 10:25 AM   Specimen: BLOOD  Result Value Ref Range Status   Specimen Description   Final    BLOOD LEFT ANTECUBITAL Performed at [facility], [address] [place], [place] [address]    Special Requests   Final    BOTTLES DRAWN AEROBIC AND ANAEROBIC Blood Culture adequate volume Performed at [facility], [address] [place], [place] [address]    Culture  Setup Time   Final    GRAM NEGATIVE RODS AEROBIC BOTTLE ONLY CRITICAL RESULT CALLED TO, READ BACK BY AND VERIFIED WITH: PHARMD T.GREEN AT 1000 ON 11/30/2023 BY T.SAAD.    Culture (A)  Final    ESCHERICHIA COLI SUSCEPTIBILITIES TO FOLLOW Performed at [facility], [address] [place], [place] [address]    Report Status PENDING  Incomplete  Blood Culture ID Panel (Reflexed)     Status: Abnormal   Collection Time: 11/28/23 10:25 AM  Result Value Ref Range Status   Enterococcus faecalis NOT DETECTED NOT DETECTED Final   Enterococcus  Faecium NOT DETECTED NOT DETECTED Final   Listeria monocytogenes NOT DETECTED NOT DETECTED Final   Staphylococcus species NOT DETECTED NOT DETECTED Final   Staphylococcus aureus (BCID) NOT DETECTED NOT DETECTED Final   Staphylococcus epidermidis NOT DETECTED NOT DETECTED Final   Staphylococcus lugdunensis NOT DETECTED NOT DETECTED Final   Streptococcus species NOT DETECTED NOT DETECTED Final   Streptococcus agalactiae NOT DETECTED NOT DETECTED Final   Streptococcus pneumoniae NOT DETECTED NOT DETECTED Final   Streptococcus pyogenes NOT DETECTED NOT DETECTED Final   A.calcoaceticus-baumannii NOT DETECTED NOT DETECTED Final   Bacteroides fragilis NOT DETECTED NOT DETECTED Final   Enterobacterales DETECTED (A) NOT DETECTED Final    Comment: Enterobacterales represent a large order of gram negative bacteria, not a single organism. CRITICAL RESULT CALLED TO, READ BACK BY AND VERIFIED WITH: PHARMD T.GREEN AT 1000 ON 11/30/2023 BY T.SAAD.    Enterobacter cloacae complex NOT DETECTED NOT DETECTED Final   Escherichia coli DETECTED (A) NOT DETECTED Final    Comment: CRITICAL RESULT CALLED TO, READ BACK BY AND VERIFIED WITH: PHARMD T.GREEN AT 1000 ON 11/30/2023 BY T.SAAD.    Klebsiella aerogenes NOT DETECTED NOT DETECTED Final   Klebsiella oxytoca NOT DETECTED NOT DETECTED Final   Klebsiella pneumoniae NOT DETECTED NOT DETECTED Final   Proteus species NOT DETECTED NOT DETECTED Final   Salmonella species NOT DETECTED NOT DETECTED Final   Serratia marcescens NOT DETECTED NOT DETECTED Final   Haemophilus influenzae NOT DETECTED NOT DETECTED Final   Neisseria meningitidis NOT DETECTED NOT DETECTED Final   Pseudomonas aeruginosa NOT DETECTED NOT DETECTED Final   Stenotrophomonas maltophilia NOT DETECTED NOT DETECTED Final   Candida albicans NOT DETECTED NOT DETECTED Final   Candida auris NOT DETECTED NOT DETECTED Final   Candida glabrata NOT DETECTED NOT DETECTED Final   Candida krusei NOT  DETECTED NOT DETECTED Final   Candida parapsilosis NOT DETECTED NOT DETECTED Final   Candida tropicalis NOT DETECTED NOT DETECTED Final   Cryptococcus neoformans/gattii NOT DETECTED NOT DETECTED Final   CTX-M ESBL DETECTED (A) NOT DETECTED Final    Comment: CRITICAL RESULT CALLED TO, READ  BACK BY AND VERIFIED WITH: PHARMD T.GREEN AT 1000 ON 11/30/2023 BY T.SAAD. (NOTE) Extended spectrum beta-lactamase detected. Recommend a carbapenem as initial therapy.      Carbapenem resistance IMP NOT DETECTED NOT DETECTED Final   Carbapenem resistance KPC NOT DETECTED NOT DETECTED Final   Carbapenem resistance NDM NOT DETECTED NOT DETECTED Final   Carbapenem resist OXA 48 LIKE NOT DETECTED NOT DETECTED Final   Carbapenem resistance VIM NOT DETECTED NOT DETECTED Final    Comment: Performed at [facility], [address] [place], [place] [address]         Radiology Studies: US RENAL Result Date: 11/29/2023 CLINICAL DATA:  Hematuria. EXAM: RENAL / URINARY TRACT ULTRASOUND COMPLETE COMPARISON:  CT 01/01/2022 FINDINGS: Right Kidney: Renal measurements: 14.4 x 5.4 x 6 cm = volume: 246 mL. Moderate hydronephrosis. Mild thinning of the renal parenchyma. No visualized stone or focal lesion. Left Kidney: Renal measurements: 12.3 x 6.3 x 4.3 cm = volume: 175 mL. Kidney is poorly assessed on the current exam due to overlying shadowing. No hydronephrosis. No obvious focal lesion or stone. Bladder: Moderately distended. There is intraluminal debris as well as diffuse wall thickening. Neither ureteral jet is demonstrated. Other: None. IMPRESSION: 1. Moderate right hydronephrosis. Slight thinning of the right renal parenchyma. 2. No left hydronephrosis. More detailed left renal assessment is limited. 3. Moderate bladder distension with debris and wall thickening. Electronically Signed   By: Narda Rutherford M.D.   On: 11/29/2023 22:43   CT HEAD WO CONTRAST ( ) Result Date: 11/29/2023 CLINICAL DATA:   Altered mental status EXAM: CT HEAD WITHOUT CONTRAST TECHNIQUE: Contiguous axial images were obtained from the base of the skull through the vertex without intravenous contrast. RADIATION DOSE REDUCTION: This exam was performed according to the departmental dose-optimization program which includes automated exposure control, adjustment of the mA and/or kV according to patient size and/or use of iterative reconstruction technique. COMPARISON:  None Available. FINDINGS: Brain: There is no mass, hemorrhage or extra-axial collection. The size and configuration of the ventricles and extra-axial CSF spaces are normal. There is hypoattenuation of the white matter, most commonly indicating chronic small vessel disease. Vascular: Atherosclerotic calcification of the internal carotid arteries at the skull base. No abnormal hyperdensity of the major intracranial arteries or dural venous sinuses. Skull: The visualized skull base, calvarium and extracranial soft tissues are normal. Sinuses/Orbits: No fluid levels or advanced mucosal thickening of the visualized paranasal sinuses. No mastoid or middle ear effusion. Normal orbits. Other: None. IMPRESSION: 1. No acute intracranial abnormality. 2. Chronic small vessel disease. Electronically Signed   By: Deatra Robinson M.D.   On: 11/29/2023 19:38        Scheduled Meds:  Carbidopa-Levodopa ER  1 tablet Oral Q2000   Carbidopa-Levodopa ER  2 tablet Oral TID PC   DULoxetine  20 mg Oral q morning   feeding supplement  237 mL Oral BID BM   levothyroxine  100 mcg Oral Q0600   metoprolol tartrate  12.5 mg Oral BID   rosuvastatin  10 mg Oral QPM   senna-docusate  1 tablet Oral QHS   Continuous Infusions:  meropenem (MERREM) IV 1 g (12/01/23 0604)     LOS: 3 days    Time spent: 48 minutes spent on 12/01/2023 caring for this patient face-to-face including chart review, ordering labs/tests, documenting, discussion with nursing staff, consultants, updating family and  interview/physical exam    Alvira Philips [place], DO [facility] Available via Epic secure chat 7am-7pm After  these hours, please refer to coverage provider listed on [email or web address] 12/01/2023, 12:52 PM

## 2023-12-01 NOTE — TOC Initial Note (Signed)
 Transition of Care [facility]) - Initial/Assessment Note    Patient Details  Name: Anna Bernard MRN: [id number] Date of Birth: Aug 28, 1951  Transition of Care [facility]) CM/SW Contact:    Adrian Prows, RN Phone Number: 12/01/2023, 5:00 PM  Clinical Narrative:                 TOC for d/c planning; PT recc HHPT/OT; pt oriented x 1 to self; called her dtr Rico Junker [phone]); she says pt lives at home w/ her spouse; she plans for pt to return at d/c; she also says she will provide transportation; Ms Elijah Birk verified pt has Insurance/PCP; she denies pt experiencing SDOH risk; she says pt has  purewick, transport chair, hoyer, HH aide from [employer]; pt does not have home oxygen; her dtr declines HHP/OT at this time; she will let TOC know if she decides to accept recc services; TOC will follow.  Expected Discharge Plan: Home/Self Care Barriers to Discharge: Continued Medical Work up   Patient Goals and CMS Choice Patient states their goals for this hospitalization and ongoing recovery are:: per pt's dtr Rico Junker pt will return home CMS Medicare.gov Compare Post Acute Care list provided to:: Patient Represenative (must comment) Rico Junker (dtr))   [place] ownership interest in [facility].provided to:: Adult Children    Expected Discharge Plan and Services   Discharge Planning Services: CM Consult   Living arrangements for the past 2 months: Single Family Home                                      Prior Living Arrangements/Services Living arrangements for the past 2 months: Single Family Home Lives with:: Spouse Patient language and need for interpreter reviewed:: Yes Do you feel safe going back to the place where you live?: Yes      Need for Family Participation in Patient Care: Yes (Comment) Care giver support system in place?: Yes (comment) Current home services: DME (purewick, transport chair, hoyer, HH aide from [facility]) Criminal Activity/Legal Involvement Pertinent to Current Situation/Hospitalization: No - Comment as needed  Activities of Daily Living   ADL Screening (condition at time of admission) Independently performs ADLs?: No Does the patient have a NEW difficulty with bathing/dressing/toileting/self-feeding that is expected to last >3 days?: No Does the patient have a NEW difficulty with getting in/out of bed, walking, or climbing stairs that is expected to last >3 days?: No Does the patient have a NEW difficulty with communication that is expected to last >3 days?: No Is the patient deaf or have difficulty hearing?: No Does the patient have difficulty seeing, even when wearing glasses/contacts?: No Does the patient have difficulty concentrating, remembering, or making decisions?: Yes  Permission Sought/Granted Permission sought to share information with : Case Manager Permission granted to share information with : Yes, Verbal Permission Granted  Share Information with NAME: Case Manager     Permission granted to share info w Relationship: Rico Junker (dtr) [phone]     Emotional Assessment Appearance:: Appears stated age Attitude/Demeanor/Rapport: Unable to Assess Affect (typically observed): Unable to Assess Orientation: :  (unable to assess) Alcohol / Substance Use: Not Applicable Psych Involvement: No (comment)  Admission diagnosis:  Hypocalcemia [E83.51] Acute cystitis with hematuria [N30.01] Acute UTI (urinary tract infection) [N39.0] Sepsis with encephalopathy without septic shock, due to unspecified organism (HCC) [A41.9, R65.20, G93.41] Patient Active Problem List  Diagnosis Date Noted   Acute UTI (urinary tract infection) 11/28/2023   Protein-calorie malnutrition, severe (HCC) 11/28/2023   Acute metabolic encephalopathy 11/28/2023   Healthcare maintenance 05/04/2023   Iron deficiency anemia 04/04/2023   Hx of pulmonary embolus 04/03/2023   Debility  03/29/2023   Urinary tract infection without hematuria 03/28/2023   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 coli infection 04/29/2022   Need for home health care 04/29/2022   Hypothyroidism 03/03/2022   Non-ST elevation (NSTEMI) myocardial infarction [facility])    Paroxysmal atrial fibrillation (HCC)    Chronic bilateral low back pain without sciatica 01/14/2022   Seborrheic keratoses 07/09/2021   Parkinson disease (HCC) 04/23/2020   Chronic venous insufficiency 11/26/2019   Vitamin B12 deficiency 02/06/2018   Iron deficiency anemia due to chronic blood loss 01/29/2018   Pure hypercholesterolemia 12/24/2014   Diabetes mellitus type 2, controlled, without complications (HCC) 12/24/2014   Bruit of right carotid artery 12/24/2014   Thyroid disease 07/06/2014   Severe obesity (BMI >= 40) (HCC) 07/06/2014   Essential hypertension 07/06/2014   PCP:  Billey Co, MD Pharmacy:   [facility] [phone] [place] Otto,  - [address] [place] [address] Phone: [phone] Fax: [phone]     Social Drivers of Health (SDOH) Social History: SDOH Screenings   Food Insecurity: No Food Insecurity (12/01/2023)  Housing: Low Risk  (12/01/2023)  Transportation Needs: No Transportation Needs (12/01/2023)  Utilities: Not At Risk (12/01/2023)  Recent Concern: Utilities - At Risk (11/29/2023)  Alcohol Screen: Low Risk  (01/05/2023)  Depression (PHQ2-9): High Risk (07/28/2023)   Financial Resource Strain: Low Risk  (01/05/2023)  Physical Activity: Inactive (01/05/2023)  Social Connections: Moderately Isolated (11/29/2023)  Stress: No Stress Concern Present (01/05/2023)  Tobacco Use: Medium Risk (11/29/2023)   SDOH Interventions: Food Insecurity Interventions: Intervention Not Indicated, Inpatient TOC Housing Interventions: Intervention Not Indicated, Inpatient TOC Transportation Interventions: Intervention Not Indicated, Inpatient TOC Utilities Interventions: Intervention Not Indicated, Inpatient TOC   Readmission Risk Interventions    12/01/2023    4:57 PM 01/17/2023    1:37 PM  Readmission Risk Prevention Plan  Transportation Screening Complete Complete  PCP or Specialist Appt within 5-7 Days Complete Complete  Home Care Screening Complete Complete  Medication Review (RN CM) Complete Referral to Pharmacy

## 2023-12-01 NOTE — Plan of Care (Signed)

## 2023-12-02 DIAGNOSIS — N390 Urinary tract infection, site not specified: Secondary | ICD-10-CM | POA: Diagnosis not present

## 2023-12-02 LAB — CULTURE, BLOOD (ROUTINE X 2): Special Requests: ADEQUATE

## 2023-12-02 LAB — CBC
HCT: 33.5 % — ABNORMAL LOW (ref 36.0–46.0)
Hemoglobin: 11 g/dL — ABNORMAL LOW (ref 12.0–15.0)
MCH: 26.5 pg (ref 26.0–34.0)
MCHC: 32.8 g/dL (ref 30.0–36.0)
MCV: 80.7 fL (ref 80.0–100.0)
Platelets: 386 10*3/uL (ref 150–400)
RBC: 4.15 MIL/uL (ref 3.87–5.11)
RDW: 13.9 % (ref 11.5–15.5)
WBC: 10.2 10*3/uL (ref 4.0–10.5)
nRBC: 0 % (ref 0.0–0.2)

## 2023-12-02 LAB — URINE CULTURE: Culture: 90000 — AB

## 2023-12-02 LAB — GLUCOSE, CAPILLARY: Glucose-Capillary: 86 mg/dL (ref 70–99)

## 2023-12-02 LAB — BASIC METABOLIC PANEL
Anion gap: 9 (ref 5–15)
BUN: 9 mg/dL (ref 8–23)
CO2: 27 mmol/L (ref 22–32)
Calcium: 8.9 mg/dL (ref 8.9–10.3)
Chloride: 103 mmol/L (ref 98–111)
Creatinine, Ser: 0.47 mg/dL (ref 0.44–1.00)
GFR, Estimated: >60 mL/min (ref >60)
Glucose, Bld: 91 mg/dL (ref 70–99)
Potassium: 3.8 mmol/L (ref 3.5–5.1)
Sodium: 139 mmol/L (ref 135–145)

## 2023-12-02 LAB — MAGNESIUM: Magnesium: 2.1 mg/dL (ref 1.7–2.4)

## 2023-12-02 NOTE — Progress Notes (Signed)
 PROGRESS NOTE    Anna Bernard  GMW:[id number] DOB: 04/18/1951 DOA: 11/28/2023 PCP: Billey Co, MD    Brief Narrative:   Anna Bernard is a 73 y.o. female with past medical history significant for HTN, HLD, CAD, hypothyroidism, paroxysmal atrial fibrillation no longer on anticoagulation, history of Bernard, hypothyroid, anemia, Parkinson's disease, vitamin B12 deficiency, right subclavian artery stenosis, who presented to [facility] ED via EMS from home for progressive weakness/lethargy, confusion.  Family reports decreased urine output and oral intake.  Patient unable to provide further information and only follows simple commands.  In the ED, temperature 100.5 F, HR 86, RR 23, BP 103/62, SpO2 95% on room air.  WBC 12.0, hemoglobin 9.2, platelet count 243.  Sodium 143, potassium 3.6, chloride 115, CO2 18, glucose 96, BUN 39, creatinine 0.71.  AST 23, ALT 7, total bilirubin 0.9.  High sensitive troponin 12.  Lactic acid 0.7> 0.8.  Urinalysis with small leukocytes, negative nitrite, greater than 300 protein, many bacteria, greater than 50 RBCs, greater than 50 WBCs.  Chest x-ray with no active cardiopulmonary disease process.  Blood cultures x 2 and urine culture obtained. The patient received 2000 mL of normal saline bolus, ceftriaxone 2 g IVPB, calcium gluconate 1 g IVPB and acetaminophen 650 mg suppository PR x 1.  Given history of ESBL, patient was started on meropenem.  TRH consulted for admission for further evaluation management of acute metabolic encephalopathy likely secondary to urinary tract infection.  Assessment & Plan:   Acute metabolic encephalopathy ESBL E. coli bacteremia E. coli/Proteus mirabilis UTI Hemorrhagic cystitis Patient presenting to ED with progressive weakness, lethargy, confusion.  Also with decreased oral intake, urine output.  History of previous ESBL E. coli urinary tract infections per review of MAR.  Patient with temperature 100.5 F on mission.  WBC  count elevated 20.0 with normal lactic acid.  Urinalysis consistent with UTI.  CT head without contrast with no acute intracranial abnormality. -- WBC 12.0>13.9>18.1>9.6>10.2 -- Blood cultures x 2: + GNR, BCID + ESBL Ecoli -- Urine culture: + Ecoli/Proteus Mirabilis -- Meropenem 1 g IV every 8 hours; plan 10 day course (Day #5)  Dysphagia -- Seen by speech therapy -- Dysphagia 3 diet -- Aspiration precautions; assist with feeding  Essential hypertension Paroxysmal atrial fibrillation no longer on anticoagulation -- Metoprolol tartrate 12.5 mg p.o. twice daily  Hyperlipidemia CAD -- Crestor 10 mg p.o. daily  Hypothyroidism -- Levothyroxine 100 mcg p.o. daily  History of anemia History of small bowel AVM -- Hgb 9.2>11.0>10.5>9.4>11.0; stable -- Transfuse for hemoglobin less than 7.0  GERD -- Protonix 40 mg p.o. daily as needed heartburn  History of syncope  Seen by cardiology previously, thought to be secondary to orthostasis; probably as well as autonomic dysfunction with history of Parkinson's disease. -- Supportive care, fall precautions  Parkinson disease with dementia Follows with neurology outpatient, Dr. Arbutus Leas.  Last seen in clinic on 08/30/2023.  Per neurology note, no ambulates much and memory has been very poor. -- Sinemet 4 times daily -- Melatonin 3 mg p.o. nightly -- Delirium precautions -- Get up during the day -- Encourage a familiar face to remain present throughout the day -- Keep blinds open and lights on during daylight hours -- Minimize the use of opioids/benzodiazepines  Weakness/debility/deconditioning: -- PT/OT recommend home health PT/OT -- Continue therapy efforts while inpatient   Pressure injury, right buttock, stage I sacrum, ischial tuberosity, POA Pressure Injury 11/29/23 Buttocks Right Deep Tissue Pressure Injury - Purple  or maroon localized area of discolored intact skin or blood-filled blister due to damage of underlying soft tissue from  pressure and/or shear. unblanchable area, dark in color w (Active)  11/29/23 0700  Location: Buttocks  Location Orientation: Right  Staging: Deep Tissue Pressure Injury - Purple or maroon localized area of discolored intact skin or blood-filled blister due to damage of underlying soft tissue from pressure and/or shear.  Wound Description (Comments): unblanchable area, dark in color with blistered skin  Present on Admission: Yes     Pressure Injury 11/29/23 Sacrum Mid Stage 1 -  Intact skin with non-blanchable redness of a localized area usually over a bony prominence. (Active)  11/29/23 0700  Location: Sacrum  Location Orientation: Mid  Staging: Stage 1 -  Intact skin with non-blanchable redness of a localized area usually over a bony prominence.  Wound Description (Comments):   Present on Admission: Yes     Pressure Injury 11/29/23 Ischial tuberosity Right Deep Tissue Pressure Injury - Purple or maroon localized area of discolored intact skin or blood-filled blister due to damage of underlying soft tissue from pressure and/or shear. (Active)  11/29/23 0700  Location: Ischial tuberosity  Location Orientation: Right  Staging: Deep Tissue Pressure Injury - Purple or maroon localized area of discolored intact skin or blood-filled blister due to damage of underlying soft tissue from pressure and/or shear.  Wound Description (Comments):   Present on Admission: Yes     Pressure Injury Ischial tuberosity Left Deep Tissue Pressure Injury - Purple or maroon localized area of discolored intact skin or blood-filled blister due to damage of underlying soft tissue from pressure and/or shear. (Active)     Location: Ischial tuberosity  Location Orientation: Left  Staging: Deep Tissue Pressure Injury - Purple or maroon localized area of discolored intact skin or blood-filled blister due to damage of underlying soft tissue from pressure and/or shear.  Wound Description (Comments):   Present on  Admission: Yes  Seen by wound RN, continue local wound care, offloading, air mattress     DVT prophylaxis: SCDs Start: 11/28/23 1400    Code Status: Full Code Family Communication: No family present at bedside this morning  Disposition Plan:  Level of care: Med-Surg Status is: Inpatient Remains inpatient appropriate because: IV antibiotics, pending PT/OT evaluation, further workup    Consultants:  None  Procedures:  None  Antimicrobials:  Ceftriaxone 3/11 - 3/11 Meropenem 3/11>>   Subjective: Patient seen examined bedside, lying in bed.  Alert, pleasantly confused.  No family present this morning. Denies pain, no shortness of breath, no chest pain.  No acute concerns overnight per nursing staff.  Objective: Vitals:   12/01/23 2203 12/02/23 0459 12/02/23 0700 12/02/23 1221  BP: (!) 160/84 (!) 171/83 (!) 158/85 134/76  Pulse: 72 74 76 74  Resp:  17 18 16   Temp:  98.3 F (36.8 C)  98.8 F (37.1 C)  TempSrc:  Oral  Oral  SpO2:  100% 97% 99%  Weight:      Height:        Intake/Output Summary (Last 24 hours) at 12/02/2023 1303 Last data filed at 12/02/2023 0745 Gross per 24 hour  Intake 60 ml  Output 1550 ml  Net -1490 ml   Filed Weights   11/29/23 1216  Weight: 72.3 kg    Examination:  Physical Exam: GEN: NAD, weak/lethargic, chronically ill appearance, appears older than stated age HEENT: NCAT, PERRL, EOMI, sclera clear, dry mucous membranes PULM: CTAB w/o wheezes/crackles, normal respiratory effort,  on room air CV: RRR w/o M/G/R GI: abd soft, NTND, + BS MSK: no peripheral edema Integumentary: No rashes/lesions/wounds nonexposed skin surfaces    Data Reviewed: I have personally reviewed following labs and imaging studies  CBC: Recent Labs  Lab 11/28/23 0944 11/29/23 0419 11/30/23 0431 12/01/23 0339 12/02/23 0504  WBC 12.0* 13.9* 10.7* 9.6 10.2  NEUTROABS 9.9*  --   --   --   --   HGB 9.2* 11.0* 10.5* 9.9* 11.0*  HCT 27.6* 33.8* 31.3* 30.1*  33.5*  MCV 81.2 83.0 80.1 80.1 80.7  PLT 243 278 294 333 386   Basic Metabolic Panel: Recent Labs  Lab 11/28/23 0944 11/29/23 0419 11/30/23 0431 12/01/23 0339 12/02/23 0504  NA 143 144 142 140 139  K 3.6 4.4 3.8 3.6 3.8  CL 115* 111 111 109 103  CO2 18* 20* 25 24 27   GLUCOSE 96 79 119* 88 91  BUN 39* 33* 29* 16 9  CREATININE 0.71 0.87 0.61 0.53 0.47  CALCIUM 6.5* 8.4* 8.3* 8.2* 8.9  MG  --   --  2.1  --  2.1  PHOS  --   --  2.0*  --   --    GFR: Estimated Creatinine Clearance: 60.1 mL/min (by C-G formula based on SCr of 0.47 mg/dL). Liver Function Tests: Recent Labs  Lab 11/28/23 0944 11/29/23 0419  AST 23 29  ALT 7 7  ALKPHOS 74 87  BILITOT 0.9 0.9  PROT 5.5* 6.4*  ALBUMIN 2.2* 2.5*   No results for input(s): "LIPASE", "AMYLASE" in the last 168 hours. No results for input(s): "AMMONIA" in the last 168 hours. Coagulation Profile: No results for input(s): "INR", "PROTIME" in the last 168 hours. Cardiac Enzymes: No results for input(s): "CKTOTAL", "CKMB", "CKMBINDEX", "TROPONINI" in the last 168 hours. BNP (last 3 results) No results for input(s): "PROBNP" in the last 8760 hours. HbA1C: No results for input(s): "HGBA1C" in the last 72 hours.  CBG: Recent Labs  Lab 12/01/23 0810 12/01/23 1120 12/01/23 1232 12/01/23 1712 12/01/23 2146  GLUCAP 82 84 94 119* 109*   Lipid Profile: No results for input(s): "CHOL", "HDL", "LDLCALC", "TRIG", "CHOLHDL", "LDLDIRECT" in the last 72 hours. Thyroid Function Tests: Recent Labs    11/30/23 0431  TSH 3.198  FREET4 0.94   Anemia Panel: Recent Labs    11/29/23 1503  VITAMINB12 565  FOLATE 29.3  FERRITIN 258  TIBC 176*  IRON 20*  RETICCTPCT 0.6   Sepsis Labs: Recent Labs  Lab 11/28/23 1033 11/28/23 1200  LATICACIDVEN 0.7 0.8    Recent Results (from the past 240 hours)  Resp panel by RT-PCR (RSV, Flu A&B, Covid)     Status: None   Collection Time: 11/28/23  9:20 AM   Specimen: Nasal Swab  Result  Value Ref Range Status   SARS Coronavirus 2 by RT PCR NEGATIVE NEGATIVE Final    Comment: (NOTE) SARS-CoV-2 target nucleic acids are NOT DETECTED.  The SARS-CoV-2 RNA is generally detectable in upper respiratory specimens during the acute phase of infection. The lowest concentration of SARS-CoV-2 viral copies this assay can detect is 138 copies/mL. A negative result does not preclude SARS-Cov-2 infection and should not be used as the sole basis for treatment or other patient management decisions. A negative result may occur with  improper specimen collection/handling, submission of specimen other than nasopharyngeal swab, presence of viral mutation(s) within the areas targeted by this assay, and inadequate number of viral copies(<138 copies/mL). A negative result must  be combined with clinical observations, patient history, and epidemiological information. The expected result is Negative.  Fact Sheet for Patients:  BloggerCourse.com  Fact Sheet for Healthcare Providers:  SeriousBroker.it  This test is no t yet approved or cleared by the [place] FDA and  has been authorized for detection and/or diagnosis of SARS-CoV-2 by FDA under an Emergency Use Authorization (EUA). This EUA will remain  in effect (meaning this test can be used) for the duration of the COVID-19 declaration under Section 564(b)(1) of the Act, 21 U.S.C.section 360bbb-3(b)(1), unless the authorization is terminated  or revoked sooner.       Influenza A by PCR NEGATIVE NEGATIVE Final   Influenza B by PCR NEGATIVE NEGATIVE Final    Comment: (NOTE) The Xpert Xpress SARS-CoV-2/FLU/RSV plus assay is intended as an aid in the diagnosis of influenza from Nasopharyngeal swab specimens and should not be used as a sole basis for treatment. Nasal washings and aspirates are unacceptable for Xpert Xpress SARS-CoV-2/FLU/RSV testing.  Fact Sheet for  Patients: BloggerCourse.com  Fact Sheet for Healthcare Providers: SeriousBroker.it  This test is not yet approved or cleared by the [place] FDA and has been authorized for detection and/or diagnosis of SARS-CoV-2 by FDA under an Emergency Use Authorization (EUA). This EUA will remain in effect (meaning this test can be used) for the duration of the COVID-19 declaration under Section 564(b)(1) of the Act, 21 U.S.C. section 360bbb-3(b)(1), unless the authorization is terminated or revoked.     Resp Syncytial Virus by PCR NEGATIVE NEGATIVE Final    Comment: (NOTE) Fact Sheet for Patients: BloggerCourse.com  Fact Sheet for Healthcare Providers: SeriousBroker.it  This test is not yet approved or cleared by the [place] FDA and has been authorized for detection and/or diagnosis of SARS-CoV-2 by FDA under an Emergency Use Authorization (EUA). This EUA will remain in effect (meaning this test can be used) for the duration of the COVID-19 declaration under Section 564(b)(1) of the Act, 21 U.S.C. section 360bbb-3(b)(1), unless the authorization is terminated or revoked.  Performed at [facility], [address] [place], [place] [address]   Urine Culture (for pregnant, neutropenic or urologic patients or patients with an indwelling urinary catheter)     Status: Abnormal   Collection Time: 11/28/23  9:44 AM   Specimen: Urine, Clean Catch  Result Value Ref Range Status   Specimen Description   Final    URINE, CLEAN CATCH Performed at [facility], [address] [place], [place] [address]    Special Requests   Final    NONE Performed at [facility], [address], [place], [place] [address]    Culture (A)  Final    90,000 COLONIES/mL PROTEUS MIRABILIS >=100,000 COLONIES/mL ESCHERICHIA COLI Confirmed Extended  Spectrum Beta-Lactamase Producer (ESBL).  In bloodstream infections from ESBL organisms, carbapenems are preferred over piperacillin/tazobactam. They are shown to have a lower risk of mortality.    Report Status 12/02/2023 FINAL  Final   Organism ID, Bacteria PROTEUS MIRABILIS (A)  Final   Organism ID, Bacteria ESCHERICHIA COLI (A)  Final      Susceptibility   Escherichia coli - MIC*    AMPICILLIN >=32 RESISTANT Resistant     CEFAZOLIN >=64 RESISTANT Resistant     CEFEPIME 16 RESISTANT Resistant     CEFTRIAXONE >=64 RESISTANT Resistant     CIPROFLOXACIN >=4 RESISTANT Resistant     GENTAMICIN >=16 RESISTANT Resistant     IMIPENEM <=0.25 SENSITIVE  Sensitive     NITROFURANTOIN <=16 SENSITIVE Sensitive     TRIMETH/SULFA >=320 RESISTANT Resistant     AMPICILLIN/SULBACTAM 4 SENSITIVE Sensitive     PIP/TAZO <=4 SENSITIVE Sensitive ug/mL    * >=100,000 COLONIES/mL ESCHERICHIA COLI   Proteus mirabilis - MIC*    AMPICILLIN <=2 SENSITIVE Sensitive     CEFAZOLIN <=4 SENSITIVE Sensitive     CEFEPIME <=0.12 SENSITIVE Sensitive     CEFTRIAXONE <=0.25 SENSITIVE Sensitive     CIPROFLOXACIN <=0.25 SENSITIVE Sensitive     GENTAMICIN <=1 SENSITIVE Sensitive     IMIPENEM 2 SENSITIVE Sensitive     NITROFURANTOIN 128 RESISTANT Resistant     TRIMETH/SULFA <=20 SENSITIVE Sensitive     AMPICILLIN/SULBACTAM <=2 SENSITIVE Sensitive     PIP/TAZO <=4 SENSITIVE Sensitive ug/mL    * 90,000 COLONIES/mL PROTEUS MIRABILIS  Culture, blood (routine x 2)     Status: None (Preliminary result)   Collection Time: 11/28/23 10:25 AM   Specimen: BLOOD RIGHT FOREARM  Result Value Ref Range Status   Specimen Description   Final    BLOOD RIGHT FOREARM Performed at [facility], [address], [place], [place] [address]    Special Requests   Final    BOTTLES DRAWN AEROBIC AND ANAEROBIC Blood Culture adequate volume Performed at [facility], [address] [place], [place] [address]    Culture    Final    NO GROWTH 4 DAYS Performed at [facility], [address], [place], [place] [address]    Report Status PENDING  Incomplete  Culture, blood (routine x 2)     Status: Abnormal   Collection Time: 11/28/23 10:25 AM   Specimen: BLOOD  Result Value Ref Range Status   Specimen Description   Final    BLOOD LEFT ANTECUBITAL Performed at [facility], [address], [place], [place] [address]    Special Requests   Final    BOTTLES DRAWN AEROBIC AND ANAEROBIC Blood Culture adequate volume Performed at [facility], [address], [place], [place] [address]    Culture  Setup Time   Final    GRAM NEGATIVE RODS AEROBIC BOTTLE ONLY CRITICAL RESULT CALLED TO, READ BACK BY AND VERIFIED WITH: PHARMD T.GREEN AT 1000 ON 11/30/2023 BY T.SAAD. Performed at [facility], [address] [place], [place] [address]    Culture (A)  Final    ESCHERICHIA COLI Confirmed Extended Spectrum Beta-Lactamase Producer (ESBL).  In bloodstream infections from ESBL organisms, carbapenems are preferred over piperacillin/tazobactam. They are shown to have a lower risk of mortality.    Report Status 12/02/2023 FINAL  Final   Organism ID, Bacteria ESCHERICHIA COLI  Final      Susceptibility   Escherichia coli - MIC*    AMPICILLIN >=32 RESISTANT Resistant     CEFEPIME 16 RESISTANT Resistant     CEFTAZIDIME RESISTANT Resistant     CEFTRIAXONE >=64 RESISTANT Resistant     CIPROFLOXACIN >=4 RESISTANT Resistant     GENTAMICIN <=1 SENSITIVE Sensitive     IMIPENEM <=0.25 SENSITIVE Sensitive     TRIMETH/SULFA >=320 RESISTANT Resistant     AMPICILLIN/SULBACTAM 4 SENSITIVE Sensitive     PIP/TAZO <=4 SENSITIVE Sensitive ug/mL    * ESCHERICHIA COLI  Blood Culture ID Panel (Reflexed)     Status: Abnormal   Collection Time: 11/28/23 10:25 AM  Result Value Ref Range Status   Enterococcus faecalis NOT DETECTED NOT DETECTED Final  Enterococcus Faecium NOT DETECTED  NOT DETECTED Final   Listeria monocytogenes NOT DETECTED NOT DETECTED Final   Staphylococcus species NOT DETECTED NOT DETECTED Final   Staphylococcus aureus (BCID) NOT DETECTED NOT DETECTED Final   Staphylococcus epidermidis NOT DETECTED NOT DETECTED Final   Staphylococcus lugdunensis NOT DETECTED NOT DETECTED Final   Streptococcus species NOT DETECTED NOT DETECTED Final   Streptococcus agalactiae NOT DETECTED NOT DETECTED Final   Streptococcus pneumoniae NOT DETECTED NOT DETECTED Final   Streptococcus pyogenes NOT DETECTED NOT DETECTED Final   A.calcoaceticus-baumannii NOT DETECTED NOT DETECTED Final   Bacteroides fragilis NOT DETECTED NOT DETECTED Final   Enterobacterales DETECTED (A) NOT DETECTED Final    Comment: Enterobacterales represent a large order of gram negative bacteria, not a single organism. CRITICAL RESULT CALLED TO, READ BACK BY AND VERIFIED WITH: PHARMD T.GREEN AT 1000 ON 11/30/2023 BY T.SAAD.    Enterobacter cloacae complex NOT DETECTED NOT DETECTED Final   Escherichia coli DETECTED (A) NOT DETECTED Final    Comment: CRITICAL RESULT CALLED TO, READ BACK BY AND VERIFIED WITH: PHARMD T.GREEN AT 1000 ON 11/30/2023 BY T.SAAD.    Klebsiella aerogenes NOT DETECTED NOT DETECTED Final   Klebsiella oxytoca NOT DETECTED NOT DETECTED Final   Klebsiella pneumoniae NOT DETECTED NOT DETECTED Final   Proteus species NOT DETECTED NOT DETECTED Final   Salmonella species NOT DETECTED NOT DETECTED Final   Serratia marcescens NOT DETECTED NOT DETECTED Final   Haemophilus influenzae NOT DETECTED NOT DETECTED Final   Neisseria meningitidis NOT DETECTED NOT DETECTED Final   Pseudomonas aeruginosa NOT DETECTED NOT DETECTED Final   Stenotrophomonas maltophilia NOT DETECTED NOT DETECTED Final   Candida albicans NOT DETECTED NOT DETECTED Final   Candida auris NOT DETECTED NOT DETECTED Final   Candida glabrata NOT DETECTED NOT DETECTED Final   Candida krusei NOT DETECTED NOT DETECTED Final    Candida parapsilosis NOT DETECTED NOT DETECTED Final   Candida tropicalis NOT DETECTED NOT DETECTED Final   Cryptococcus neoformans/gattii NOT DETECTED NOT DETECTED Final   CTX-M ESBL DETECTED (A) NOT DETECTED Final    Comment: CRITICAL RESULT CALLED TO, READ BACK BY AND VERIFIED WITH: PHARMD T.GREEN AT 1000 ON 11/30/2023 BY T.SAAD. (NOTE) Extended spectrum beta-lactamase detected. Recommend a carbapenem as initial therapy.      Carbapenem resistance IMP NOT DETECTED NOT DETECTED Final   Carbapenem resistance KPC NOT DETECTED NOT DETECTED Final   Carbapenem resistance NDM NOT DETECTED NOT DETECTED Final   Carbapenem resist OXA 48 LIKE NOT DETECTED NOT DETECTED Final   Carbapenem resistance VIM NOT DETECTED NOT DETECTED Final    Comment: Performed at [facility], [address], [place], [place] [address]         Radiology Studies: No results found.       Scheduled Meds:  Carbidopa-Levodopa ER  1 tablet Oral Q2000   Carbidopa-Levodopa ER  2 tablet Oral TID PC   DULoxetine  20 mg Oral q morning   feeding supplement  237 mL Oral BID BM   levothyroxine  100 mcg Oral Q0600   metoprolol tartrate  12.5 mg Oral BID   rosuvastatin  10 mg Oral QPM   senna-docusate  1 tablet Oral QHS   Continuous Infusions:  meropenem (MERREM) IV 1 g (12/02/23 0546)     LOS: 4 days    Time spent: 48 minutes spent on 12/02/2023 caring for this patient face-to-face including chart review, ordering labs/tests, documenting, discussion with nursing staff, consultants, updating family and  interview/physical exam    Alvira Philips [place], DO [facility] Available via Epic secure chat 7am-7pm After these hours, please refer to coverage provider listed on [email or web address] 12/02/2023, 1:03 PM

## 2023-12-02 NOTE — Plan of Care (Signed)

## 2023-12-02 NOTE — Plan of Care (Addendum)
 VSS. BG 109 at bedtime. No acute events overnight.  Problem: Metabolic: Goal: Ability to maintain appropriate glucose levels will improve Outcome: Progressing   Problem: Nutritional: Goal: Maintenance of adequate nutrition will improve Outcome: Progressing   Problem: Skin Integrity: Goal: Risk for impaired skin integrity will decrease Outcome: Progressing   Problem: Education: Goal: Knowledge of General Education information will improve Description: Including pain rating scale, medication(s)/side effects and non-pharmacologic comfort measures Outcome: Progressing   Problem: Clinical Measurements: Goal: Ability to maintain clinical measurements within normal limits will improve Outcome: Progressing Goal: Will remain free from infection Outcome: Progressing   Problem: Safety: Goal: Ability to remain free from injury will improve Outcome: Progressing   Problem: Skin Integrity: Goal: Risk for impaired skin integrity will decrease Outcome: Progressing

## 2023-12-02 NOTE — Progress Notes (Signed)
 Mobility Specialist - Progress Note   12/02/23 1348  Mobility  Activity  (UE Bed exercises)  Level of Assistance Minimal assist, patient does 75% or more  Range of Motion/Exercises Active Assistive  Activity Response Tolerated fair  Mobility visit 1 Mobility  Mobility Specialist Start Time (ACUTE ONLY) 1330  Mobility Specialist Stop Time (ACUTE ONLY) 1348  Mobility Specialist Time Calculation (min) (ACUTE ONLY) 18 min   Pt was found in bed and agreeable to bed exercises. Assisted with exercises and was left with all needs met. Call bell in reach.  Supine BUE Exercises: 10 reps each  1) Elbow Flexion  2) Elbow extension   3) Shoulder taps   4) Cross body shoulder taps   5) Sky Reach  Billey Chang Mobility Specialist

## 2023-12-03 DIAGNOSIS — N390 Urinary tract infection, site not specified: Secondary | ICD-10-CM | POA: Diagnosis not present

## 2023-12-03 LAB — CULTURE, BLOOD (ROUTINE X 2)
Culture: NO GROWTH
Special Requests: ADEQUATE

## 2023-12-03 LAB — GLUCOSE, CAPILLARY
Glucose-Capillary: 82 mg/dL (ref 70–99)
Glucose-Capillary: 100 mg/dL — ABNORMAL HIGH (ref 70–99)
Glucose-Capillary: 108 mg/dL — ABNORMAL HIGH (ref 70–99)
Glucose-Capillary: 153 mg/dL — ABNORMAL HIGH (ref 70–99)

## 2023-12-03 NOTE — Plan of Care (Signed)

## 2023-12-03 NOTE — Progress Notes (Signed)
 PROGRESS NOTE    ARRAYA BUCK  ZOX:[id number] DOB: [birth year] DOA: 11/28/2023 PCP: Billey Co, MD    Brief Narrative:   Anna Bernard is a 73 y.o. female with past medical history significant for HTN, HLD, CAD, hypothyroidism, paroxysmal atrial fibrillation no longer on anticoagulation, history of PE, hypothyroid, anemia, Parkinson's disease, vitamin B12 deficiency, right subclavian artery stenosis, who presented to [facility] ED via EMS from home for progressive weakness/lethargy, confusion.  Family reports decreased urine output and oral intake.  Patient unable to provide further information and only follows simple commands.  In the ED, temperature 100.5 F, HR 86, RR 23, BP 103/62, SpO2 95% on room air.  WBC 12.0, hemoglobin 9.2, platelet count 243.  Sodium 143, potassium 3.6, chloride 115, CO2 18, glucose 96, BUN 39, creatinine 0.71.  AST 23, ALT 7, total bilirubin 0.9.  High sensitive troponin 12.  Lactic acid 0.7> 0.8.  Urinalysis with small leukocytes, negative nitrite, greater than 300 protein, many bacteria, greater than 50 RBCs, greater than 50 WBCs.  Chest x-ray with no active cardiopulmonary disease process.  Blood cultures x 2 and urine culture obtained. The patient received 2000 mL of normal saline bolus, ceftriaxone 2 g IVPB, calcium gluconate 1 g IVPB and acetaminophen 650 mg suppository PR x 1.  Given history of ESBL, patient was started on meropenem.  TRH consulted for admission for further evaluation management of acute metabolic encephalopathy likely secondary to urinary tract infection.  Assessment & Plan:   Acute metabolic encephalopathy ESBL E. coli bacteremia E. coli/Proteus mirabilis UTI Hemorrhagic cystitis Patient presenting to ED with progressive weakness, lethargy, confusion.  Also with decreased oral intake, urine output.  History of previous ESBL E. coli urinary tract infections per review of MAR.  Patient with temperature 100.5 F on mission.  WBC  count elevated 20.0 with normal lactic acid.  Urinalysis consistent with UTI.  CT head without contrast with no acute intracranial abnormality. -- WBC 12.0>13.9>18.1>9.6>10.2 -- Blood cultures x 2: + GNR, BCID + ESBL Ecoli -- Urine culture: + Ecoli/Proteus Mirabilis -- Meropenem 1 g IV every 8 hours; plan 10 day course (Day #6)  Dysphagia -- Seen by speech therapy -- Dysphagia 3 diet -- Aspiration precautions; assist with feeding  Essential hypertension Paroxysmal atrial fibrillation no longer on anticoagulation -- Metoprolol tartrate 12.5 mg p.o. twice daily  Hyperlipidemia CAD -- Crestor 10 mg p.o. daily  Hypothyroidism -- Levothyroxine 100 mcg p.o. daily  History of anemia History of small bowel AVM -- Hgb 9.2>11.0>10.5>9.4>11.0; stable -- Transfuse for hemoglobin less than 7.0  GERD -- Protonix 40 mg p.o. daily as needed heartburn  History of syncope  Seen by cardiology previously, thought to be secondary to orthostasis; probably as well as autonomic dysfunction with history of Parkinson's disease. -- Supportive care, fall precautions  Parkinson disease with dementia Follows with neurology outpatient, Dr. Arbutus Leas.  Last seen in clinic on 08/30/2023.  Per neurology note, no ambulates much and memory has been very poor. -- Sinemet 4 times daily -- Melatonin 3 mg p.o. nightly -- Delirium precautions -- Get up during the day -- Encourage a familiar face to remain present throughout the day -- Keep blinds open and lights on during daylight hours -- Minimize the use of opioids/benzodiazepines  Weakness/debility/deconditioning: -- PT/OT recommend home health PT/OT -- Continue therapy efforts while inpatient   Pressure injury, right buttock, stage I sacrum, ischial tuberosity, POA Pressure Injury 11/29/23 Buttocks Right Deep Tissue Pressure Injury - Purple  or maroon localized area of discolored intact skin or blood-filled blister due to damage of underlying soft tissue from  pressure and/or shear. unblanchable area, dark in color w (Active)  11/29/23 0700  Location: Buttocks  Location Orientation: Right  Staging: Deep Tissue Pressure Injury - Purple or maroon localized area of discolored intact skin or blood-filled blister due to damage of underlying soft tissue from pressure and/or shear.  Wound Description (Comments): unblanchable area, dark in color with blistered skin  Present on Admission: Yes     Pressure Injury 11/29/23 Sacrum Mid Stage 1 -  Intact skin with non-blanchable redness of a localized area usually over a bony prominence. (Active)  11/29/23 0700  Location: Sacrum  Location Orientation: Mid  Staging: Stage 1 -  Intact skin with non-blanchable redness of a localized area usually over a bony prominence.  Wound Description (Comments):   Present on Admission: Yes     Pressure Injury 11/29/23 Ischial tuberosity Right Deep Tissue Pressure Injury - Purple or maroon localized area of discolored intact skin or blood-filled blister due to damage of underlying soft tissue from pressure and/or shear. (Active)  11/29/23 0700  Location: Ischial tuberosity  Location Orientation: Right  Staging: Deep Tissue Pressure Injury - Purple or maroon localized area of discolored intact skin or blood-filled blister due to damage of underlying soft tissue from pressure and/or shear.  Wound Description (Comments):   Present on Admission: Yes     Pressure Injury Ischial tuberosity Left Deep Tissue Pressure Injury - Purple or maroon localized area of discolored intact skin or blood-filled blister due to damage of underlying soft tissue from pressure and/or shear. (Active)     Location: Ischial tuberosity  Location Orientation: Left  Staging: Deep Tissue Pressure Injury - Purple or maroon localized area of discolored intact skin or blood-filled blister due to damage of underlying soft tissue from pressure and/or shear.  Wound Description (Comments):   Present on  Admission: Yes  Seen by wound RN, continue local wound care, offloading, air mattress     DVT prophylaxis: SCDs Start: 11/28/23 1400    Code Status: Full Code Family Communication: No family present at bedside this morning  Disposition Plan:  Level of care: Med-Surg Status is: Inpatient Remains inpatient appropriate because: IV antibiotics, pending PT/OT evaluation, further workup    Consultants:  None  Procedures:  None  Antimicrobials:  Ceftriaxone 3/11 - 3/11 Meropenem 3/11>>   Subjective: Patient seen examined bedside, lying in bed.  Alert, pleasantly confused.  No family present this morning. Denies pain, no shortness of breath, no chest pain.  No acute concerns overnight per nursing staff.  Objective: Vitals:   12/02/23 1221 12/02/23 2128 12/02/23 2145 12/03/23 0411  BP: 134/76 (!) 142/79 (!) 142/79 (!) 156/85  Pulse: 74 72 72 83  Resp: 16 (!) 22  18  Temp: 98.8 F (37.1 C) 98.1 F (36.7 C)  98 F (36.7 C)  TempSrc: Oral Oral  Oral  SpO2: 99% 100%  98%  Weight:      Height:        Intake/Output Summary (Last 24 hours) at 12/03/2023 1247 Last data filed at 12/03/2023 0858 Gross per 24 hour  Intake 590 ml  Output 1250 ml  Net -660 ml   Filed Weights   11/29/23 1216  Weight: 72.3 kg    Examination:  Physical Exam: GEN: NAD, weak/lethargic, chronically ill appearance, appears older than stated age HEENT: NCAT, PERRL, EOMI, sclera clear, dry mucous membranes PULM: CTAB w/o  wheezes/crackles, normal respiratory effort, on room air CV: RRR w/o M/G/R GI: abd soft, NTND, + BS MSK: no peripheral edema Integumentary: No rashes/lesions/wounds nonexposed skin surfaces    Data Reviewed: I have personally reviewed following labs and imaging studies  CBC: Recent Labs  Lab 11/28/23 0944 11/29/23 0419 11/30/23 0431 12/01/23 0339 12/02/23 0504  WBC 12.0* 13.9* 10.7* 9.6 10.2  NEUTROABS 9.9*  --   --   --   --   HGB 9.2* 11.0* 10.5* 9.9* 11.0*  HCT  27.6* 33.8* 31.3* 30.1* 33.5*  MCV 81.2 83.0 80.1 80.1 80.7  PLT 243 278 294 333 386   Basic Metabolic Panel: Recent Labs  Lab 11/28/23 0944 11/29/23 0419 11/30/23 0431 12/01/23 0339 12/02/23 0504  NA 143 144 142 140 139  K 3.6 4.4 3.8 3.6 3.8  CL 115* 111 111 109 103  CO2 18* 20* 25 24 27   GLUCOSE 96 79 119* 88 91  BUN 39* 33* 29* 16 9  CREATININE 0.71 0.87 0.61 0.53 0.47  CALCIUM 6.5* 8.4* 8.3* 8.2* 8.9  MG  --   --  2.1  --  2.1  PHOS  --   --  2.0*  --   --    GFR: Estimated Creatinine Clearance: 60.1 mL/min (by C-G formula based on SCr of 0.47 mg/dL). Liver Function Tests: Recent Labs  Lab 11/28/23 0944 11/29/23 0419  AST 23 29  ALT 7 7  ALKPHOS 74 87  BILITOT 0.9 0.9  PROT 5.5* 6.4*  ALBUMIN 2.2* 2.5*   No results for input(s): "LIPASE", "AMYLASE" in the last 168 hours. No results for input(s): "AMMONIA" in the last 168 hours. Coagulation Profile: No results for input(s): "INR", "PROTIME" in the last 168 hours. Cardiac Enzymes: No results for input(s): "CKTOTAL", "CKMB", "CKMBINDEX", "TROPONINI" in the last 168 hours. BNP (last 3 results) No results for input(s): "PROBNP" in the last 8760 hours. HbA1C: No results for input(s): "HGBA1C" in the last 72 hours.  CBG: Recent Labs  Lab 12/01/23 1712 12/01/23 2146 12/02/23 2159 12/03/23 0813 12/03/23 1150  GLUCAP 119* 109* 86 82 100*   Lipid Profile: No results for input(s): "CHOL", "HDL", "LDLCALC", "TRIG", "CHOLHDL", "LDLDIRECT" in the last 72 hours. Thyroid Function Tests: No results for input(s): "TSH", "T4TOTAL", "FREET4", "T3FREE", "THYROIDAB" in the last 72 hours.  Anemia Panel: No results for input(s): "VITAMINB12", "FOLATE", "FERRITIN", "TIBC", "IRON", "RETICCTPCT" in the last 72 hours.  Sepsis Labs: Recent Labs  Lab 11/28/23 1033 11/28/23 1200  LATICACIDVEN 0.7 0.8    Recent Results (from the past 240 hours)  Resp panel by RT-PCR (RSV, Flu A&B, Covid)     Status: None   Collection  Time: 11/28/23  9:20 AM   Specimen: Nasal Swab  Result Value Ref Range Status   SARS Coronavirus 2 by RT PCR NEGATIVE NEGATIVE Final    Comment: (NOTE) SARS-CoV-2 target nucleic acids are NOT DETECTED.  The SARS-CoV-2 RNA is generally detectable in upper respiratory specimens during the acute phase of infection. The lowest concentration of SARS-CoV-2 viral copies this assay can detect is 138 copies/mL. A negative result does not preclude SARS-Cov-2 infection and should not be used as the sole basis for treatment or other patient management decisions. A negative result may occur with  improper specimen collection/handling, submission of specimen other than nasopharyngeal swab, presence of viral mutation(s) within the areas targeted by this assay, and inadequate number of viral copies(<138 copies/mL). A negative result must be combined with clinical observations, patient history,  and epidemiological information. The expected result is Negative.  Fact Sheet for Patients:  BloggerCourse.com  Fact Sheet for Healthcare Providers:  SeriousBroker.it  This test is no t yet approved or cleared by the [place] FDA and  has been authorized for detection and/or diagnosis of SARS-CoV-2 by FDA under an Emergency Use Authorization (EUA). This EUA will remain  in effect (meaning this test can be used) for the duration of the COVID-19 declaration under Section 564(b)(1) of the Act, 21 U.S.C.section 360bbb-3(b)(1), unless the authorization is terminated  or revoked sooner.       Influenza A by PCR NEGATIVE NEGATIVE Final   Influenza B by PCR NEGATIVE NEGATIVE Final    Comment: (NOTE) The Xpert Xpress SARS-CoV-2/FLU/RSV plus assay is intended as an aid in the diagnosis of influenza from Nasopharyngeal swab specimens and should not be used as a sole basis for treatment. Nasal washings and aspirates are unacceptable for Xpert Xpress  SARS-CoV-2/FLU/RSV testing.  Fact Sheet for Patients: BloggerCourse.com  Fact Sheet for Healthcare Providers: SeriousBroker.it  This test is not yet approved or cleared by the [place] FDA and has been authorized for detection and/or diagnosis of SARS-CoV-2 by FDA under an Emergency Use Authorization (EUA). This EUA will remain in effect (meaning this test can be used) for the duration of the COVID-19 declaration under Section 564(b)(1) of the Act, 21 U.S.C. section 360bbb-3(b)(1), unless the authorization is terminated or revoked.     Resp Syncytial Virus by PCR NEGATIVE NEGATIVE Final    Comment: (NOTE) Fact Sheet for Patients: BloggerCourse.com  Fact Sheet for Healthcare Providers: SeriousBroker.it  This test is not yet approved or cleared by the [place] FDA and has been authorized for detection and/or diagnosis of SARS-CoV-2 by FDA under an Emergency Use Authorization (EUA). This EUA will remain in effect (meaning this test can be used) for the duration of the COVID-19 declaration under Section 564(b)(1) of the Act, 21 U.S.C. section 360bbb-3(b)(1), unless the authorization is terminated or revoked.  Performed at [facility], [address], [place], [place] [address]   Urine Culture (for pregnant, neutropenic or urologic patients or patients with an indwelling urinary catheter)     Status: Abnormal   Collection Time: 11/28/23  9:44 AM   Specimen: Urine, Clean Catch  Result Value Ref Range Status   Specimen Description   Final    URINE, CLEAN CATCH Performed at [facility], [address], [place], [place] [address]    Special Requests   Final    NONE Performed at [facility], [address], [place], [place] [address]    Culture (A)  Final    90,000 COLONIES/mL PROTEUS MIRABILIS >=100,000  COLONIES/mL ESCHERICHIA COLI Confirmed Extended Spectrum Beta-Lactamase Producer (ESBL).  In bloodstream infections from ESBL organisms, carbapenems are preferred over piperacillin/tazobactam. They are shown to have a lower risk of mortality.    Report Status 12/02/2023 FINAL  Final   Organism ID, Bacteria PROTEUS MIRABILIS (A)  Final   Organism ID, Bacteria ESCHERICHIA COLI (A)  Final      Susceptibility   Escherichia coli - MIC*    AMPICILLIN >=32 RESISTANT Resistant     CEFAZOLIN >=64 RESISTANT Resistant     CEFEPIME 16 RESISTANT Resistant     CEFTRIAXONE >=64 RESISTANT Resistant     CIPROFLOXACIN >=4 RESISTANT Resistant     GENTAMICIN >=16 RESISTANT Resistant     IMIPENEM <=0.25 SENSITIVE Sensitive     NITROFURANTOIN <=16  SENSITIVE Sensitive     TRIMETH/SULFA >=320 RESISTANT Resistant     AMPICILLIN/SULBACTAM 4 SENSITIVE Sensitive     PIP/TAZO <=4 SENSITIVE Sensitive ug/mL    * >=100,000 COLONIES/mL ESCHERICHIA COLI   Proteus mirabilis - MIC*    AMPICILLIN <=2 SENSITIVE Sensitive     CEFAZOLIN <=4 SENSITIVE Sensitive     CEFEPIME <=0.12 SENSITIVE Sensitive     CEFTRIAXONE <=0.25 SENSITIVE Sensitive     CIPROFLOXACIN <=0.25 SENSITIVE Sensitive     GENTAMICIN <=1 SENSITIVE Sensitive     IMIPENEM 2 SENSITIVE Sensitive     NITROFURANTOIN 128 RESISTANT Resistant     TRIMETH/SULFA <=20 SENSITIVE Sensitive     AMPICILLIN/SULBACTAM <=2 SENSITIVE Sensitive     PIP/TAZO <=4 SENSITIVE Sensitive ug/mL    * 90,000 COLONIES/mL PROTEUS MIRABILIS  Culture, blood (routine x 2)     Status: None   Collection Time: 11/28/23 10:25 AM   Specimen: BLOOD RIGHT FOREARM  Result Value Ref Range Status   Specimen Description   Final    BLOOD RIGHT FOREARM Performed at [facility], [address] [place], [place] [address]    Special Requests   Final    BOTTLES DRAWN AEROBIC AND ANAEROBIC Blood Culture adequate volume Performed at [facility], [address]  [place], [place] [address]    Culture   Final    NO GROWTH 5 DAYS Performed at [facility], [address] [place], [place] [address]    Report Status 12/03/2023 FINAL  Final  Culture, blood (routine x 2)     Status: Abnormal   Collection Time: 11/28/23 10:25 AM   Specimen: BLOOD  Result Value Ref Range Status   Specimen Description   Final    BLOOD LEFT ANTECUBITAL Performed at [facility], [address], [place], [place] [address]    Special Requests   Final    BOTTLES DRAWN AEROBIC AND ANAEROBIC Blood Culture adequate volume Performed at [facility], [address] [place], [place] [address]    Culture  Setup Time   Final    GRAM NEGATIVE RODS AEROBIC BOTTLE ONLY CRITICAL RESULT CALLED TO, READ BACK BY AND VERIFIED WITH: PHARMD T.GREEN AT 1000 ON 11/30/2023 BY T.SAAD. Performed at [facility], [address], [place], [place] [address]    Culture (A)  Final    ESCHERICHIA COLI Confirmed Extended Spectrum Beta-Lactamase Producer (ESBL).  In bloodstream infections from ESBL organisms, carbapenems are preferred over piperacillin/tazobactam. They are shown to have a lower risk of mortality.    Report Status 12/02/2023 FINAL  Final   Organism ID, Bacteria ESCHERICHIA COLI  Final      Susceptibility   Escherichia coli - MIC*    AMPICILLIN >=32 RESISTANT Resistant     CEFEPIME 16 RESISTANT Resistant     CEFTAZIDIME RESISTANT Resistant     CEFTRIAXONE >=64 RESISTANT Resistant     CIPROFLOXACIN >=4 RESISTANT Resistant     GENTAMICIN <=1 SENSITIVE Sensitive     IMIPENEM <=0.25 SENSITIVE Sensitive     TRIMETH/SULFA >=320 RESISTANT Resistant     AMPICILLIN/SULBACTAM 4 SENSITIVE Sensitive     PIP/TAZO <=4 SENSITIVE Sensitive ug/mL    * ESCHERICHIA COLI  Blood Culture ID Panel (Reflexed)     Status: Abnormal   Collection Time: 11/28/23 10:25 AM  Result Value Ref Range Status   Enterococcus faecalis NOT DETECTED NOT DETECTED Final    Enterococcus Faecium NOT DETECTED NOT DETECTED  Final   Listeria monocytogenes NOT DETECTED NOT DETECTED Final   Staphylococcus species NOT DETECTED NOT DETECTED Final   Staphylococcus aureus (BCID) NOT DETECTED NOT DETECTED Final   Staphylococcus epidermidis NOT DETECTED NOT DETECTED Final   Staphylococcus lugdunensis NOT DETECTED NOT DETECTED Final   Streptococcus species NOT DETECTED NOT DETECTED Final   Streptococcus agalactiae NOT DETECTED NOT DETECTED Final   Streptococcus pneumoniae NOT DETECTED NOT DETECTED Final   Streptococcus pyogenes NOT DETECTED NOT DETECTED Final   A.calcoaceticus-baumannii NOT DETECTED NOT DETECTED Final   Bacteroides fragilis NOT DETECTED NOT DETECTED Final   Enterobacterales DETECTED (A) NOT DETECTED Final    Comment: Enterobacterales represent a large order of gram negative bacteria, not a single organism. CRITICAL RESULT CALLED TO, READ BACK BY AND VERIFIED WITH: PHARMD T.GREEN AT 1000 ON 11/30/2023 BY T.SAAD.    Enterobacter cloacae complex NOT DETECTED NOT DETECTED Final   Escherichia coli DETECTED (A) NOT DETECTED Final    Comment: CRITICAL RESULT CALLED TO, READ BACK BY AND VERIFIED WITH: PHARMD T.GREEN AT 1000 ON 11/30/2023 BY T.SAAD.    Klebsiella aerogenes NOT DETECTED NOT DETECTED Final   Klebsiella oxytoca NOT DETECTED NOT DETECTED Final   Klebsiella pneumoniae NOT DETECTED NOT DETECTED Final   Proteus species NOT DETECTED NOT DETECTED Final   Salmonella species NOT DETECTED NOT DETECTED Final   Serratia marcescens NOT DETECTED NOT DETECTED Final   Haemophilus influenzae NOT DETECTED NOT DETECTED Final   Neisseria meningitidis NOT DETECTED NOT DETECTED Final   Pseudomonas aeruginosa NOT DETECTED NOT DETECTED Final   Stenotrophomonas maltophilia NOT DETECTED NOT DETECTED Final   Candida albicans NOT DETECTED NOT DETECTED Final   Candida auris NOT DETECTED NOT DETECTED Final   Candida glabrata NOT DETECTED NOT DETECTED Final   Candida  krusei NOT DETECTED NOT DETECTED Final   Candida parapsilosis NOT DETECTED NOT DETECTED Final   Candida tropicalis NOT DETECTED NOT DETECTED Final   Cryptococcus neoformans/gattii NOT DETECTED NOT DETECTED Final   CTX-M ESBL DETECTED (A) NOT DETECTED Final    Comment: CRITICAL RESULT CALLED TO, READ BACK BY AND VERIFIED WITH: PHARMD T.GREEN AT 1000 ON 11/30/2023 BY T.SAAD. (NOTE) Extended spectrum beta-lactamase detected. Recommend a carbapenem as initial therapy.      Carbapenem resistance IMP NOT DETECTED NOT DETECTED Final   Carbapenem resistance KPC NOT DETECTED NOT DETECTED Final   Carbapenem resistance NDM NOT DETECTED NOT DETECTED Final   Carbapenem resist OXA 48 LIKE NOT DETECTED NOT DETECTED Final   Carbapenem resistance VIM NOT DETECTED NOT DETECTED Final    Comment: Performed at [facility], [address], [place], [place] [address]         Radiology Studies: No results found.       Scheduled Meds:  Carbidopa-Levodopa ER  1 tablet Oral Q2000   Carbidopa-Levodopa ER  2 tablet Oral TID PC   DULoxetine  20 mg Oral q morning   feeding supplement  237 mL Oral BID BM   levothyroxine  100 mcg Oral Q0600   metoprolol tartrate  12.5 mg Oral BID   rosuvastatin  10 mg Oral QPM   senna-docusate  1 tablet Oral QHS   Continuous Infusions:  meropenem (MERREM) IV 1 g (12/03/23 0506)     LOS: 5 days    Time spent: 48 minutes spent on 12/03/2023 caring for this patient face-to-face including chart review, ordering labs/tests, documenting, discussion with nursing staff, consultants, updating family and interview/physical exam    Alvira Philips  [place], DO [facility] Available via Epic secure chat 7am-7pm After these hours, please refer to coverage provider listed on [email or web address] 12/03/2023, 12:47 PM

## 2023-12-04 DIAGNOSIS — N390 Urinary tract infection, site not specified: Secondary | ICD-10-CM | POA: Diagnosis not present

## 2023-12-04 LAB — GLUCOSE, CAPILLARY
Glucose-Capillary: 87 mg/dL (ref 70–99)
Glucose-Capillary: 105 mg/dL — ABNORMAL HIGH (ref 70–99)
Glucose-Capillary: 127 mg/dL — ABNORMAL HIGH (ref 70–99)
Glucose-Capillary: 98 mg/dL (ref 70–99)

## 2023-12-04 LAB — VITAMIN B1: Vitamin B1 (Thiamine): 104.6 nmol/L (ref 66.5–200.0)

## 2023-12-04 NOTE — Progress Notes (Signed)
 Physical Therapy Treatment Patient Details Name: Anna Bernard MRN: [id number] DOB: 1951/08/18 Today's Date: 12/04/2023   History of Present Illness Anna Bernard is a 73 y.o. female admitted 11/28/23 from home due to AMS, weakness, somnolence, decreased oral intake and inability to help with transfers. Dx with Acute metabolic encephalopathy and UTI. PMH includes anemia, CAD, hyperlipidemia, HTN, hypothyroidism, paroxysmal atrial fibrillation, prediabetes, PE, vascular parkinson disease and dementia, frequent syncope.    PT Comments  PT - Cognition Comments: AxO x 2 pleasant.  Following all instructions with increased time. Spouse present during session Performed bed level B LE TE's 20 reps AP 10 reps knee presses 10 reps towel squeezes 10 reps AAROM Heel Slides 10 reps AAROM hip ABd/ADd 05 reps AAROM SLR Pt plans to return home with full Family support.    If plan is discharge home, recommend the following:     Can travel by private vehicle        Equipment Recommendations  None recommended by PT    Recommendations for Other Services       Precautions / Restrictions Precautions Precautions: Fall Precaution/Restrictions Comments: parkinsons, dementia, frequent syncope Restrictions Weight Bearing Restrictions Per Provider Order: No              Cognition Arousal: Alert Behavior During Therapy: WFL for tasks assessed/performed   PT - Cognitive impairments: History of cognitive impairments                       PT - Cognition Comments: AxO x 2 pleasant.  Following all instructions with increased time.         Prior Function            PT Goals (current goals can now be found in the care plan section) Progress towards PT goals: Progressing toward goals    Frequency    Min 2X/week       AM-PAC PT "6 Clicks" Mobility   Outcome Measure  Help needed turning from your back to your side while in a flat bed without using bedrails?: A Lot Help  needed moving from lying on your back to sitting on the side of a flat bed without using bedrails?: A Lot Help needed moving to and from a bed to a chair (including a wheelchair)?: Total Help needed standing up from a chair using your arms (e.g., wheelchair or bedside chair)?: Total Help needed to walk in hospital room?: Total Help needed climbing 3-5 steps with a railing? : Total 6 Click Score: 8    End of Session   Activity Tolerance: Patient tolerated treatment well Patient left: in bed;with call bell/phone within reach;with family/visitor present   PT Visit Diagnosis: Muscle weakness (generalized) (M62.81)     Time: 3016-0109 PT Time Calculation (min) (ACUTE ONLY): 15 min  Charges:    $Therapeutic Exercise: 8-22 mins PT General Charges $$ ACUTE PT VISIT: 1 Visit                     Felecia Shelling  PTA Acute  Rehabilitation Services Office M-F          [phone]

## 2023-12-04 NOTE — Plan of Care (Signed)

## 2023-12-04 NOTE — Progress Notes (Signed)
 Occupational Therapy Treatment Patient Details Name: Anna Bernard MRN: [id number] DOB: 1951/04/02 Today's Date: 12/04/2023   History of present illness Anna Bernard is a 73 y.o. female admitted 11/28/23 from home due to AMS, weakness, somnolence, decreased oral intake and inability to help with transfers. Dx with Acute metabolic encephalopathy and UTI. PMH includes anemia, CAD, hyperlipidemia, HTN, hypothyroidism, paroxysmal atrial fibrillation, prediabetes, PE, vascular parkinson disease and dementia, frequent syncope.   OT comments  Patient was able to engage in some BUE exercises with and without yellow theraband with limited ability to engage in task. Patient was quick to ask for break from task. Patient did report having therabands at home that she uses. OT to follow patient for additional session to see if she is able to engage in HEP more. If not OT will sign off.       If plan is discharge home, recommend the following:  Two people to help with walking and/or transfers;A lot of help with bathing/dressing/bathroom;Assistance with cooking/housework;Assistance with feeding;Assist for transportation   Equipment Recommendations  None recommended by OT       Precautions / Restrictions Precautions Precautions: Fall Precaution/Restrictions Comments: parkinsons, dementia, frequent syncope Restrictions Weight Bearing Restrictions Per Provider Order: No              ADL either performed or assessed with clinical judgement   ADL Overall ADL's : At baseline Eating/Feeding: Minimal assistance;Bed level (to take sips of tea)            Cognition Arousal: Alert Behavior During Therapy: Flat affect Cognition: History of cognitive impairments             Exercises Other Exercises Other Exercises: patient attempted to participate in yellow theraband exercises on this date. patient empericaly weak with elbow flexion/extension and attempts at [facility]. patient minimally moveing UE  with or without bands. patient was educated on importance of BUE movement when in room. patient verbalized understanding. Other Exercises: patient was able to complete 10 stress ball squeezes bilaterally.            Pertinent Vitals/ Pain       Pain Assessment Pain Assessment: No/denies pain         Frequency  Min 1X/week        Progress Toward Goals  OT Goals(current goals can now be found in the care plan section)  Progress towards OT goals: Not progressing toward goals - comment (limited ability to engage in BUE movement today. patient is trying)     Plan         AM-PAC OT "6 Clicks" Daily Activity     Outcome Measure   Help from another person eating meals?: A Little Help from another person taking care of personal grooming?: A Lot Help from another person toileting, which includes using toliet, bedpan, or urinal?: Total Help from another person bathing (including washing, rinsing, drying)?: Total Help from another person to put on and taking off regular upper body clothing?: Total Help from another person to put on and taking off regular lower body clothing?: Total 6 Click Score: 9    End of Session    OT Visit Diagnosis: Muscle weakness (generalized) (M62.81)   Activity Tolerance Patient tolerated treatment well   Patient Left in bed;with call bell/phone within reach   Nurse Communication          Time: 5284-1324 OT Time Calculation (min): 15 min  Charges: OT General Charges $OT Visit: 1 Visit OT Treatments $Therapeutic  Activity: 8-22 mins  Rosalio Loud, MS Acute Rehabilitation Department Office# [phone]   Selinda Flavin 12/04/2023, 4:08 PM

## 2023-12-04 NOTE — Progress Notes (Signed)
 PROGRESS NOTE    Anna Bernard  ZOX:[id number] DOB: [birth year] DOA: 11/28/2023 PCP: Billey Co, MD    Brief Narrative:   Anna Bernard is a 73 y.o. female with past medical history significant for HTN, HLD, CAD, hypothyroidism, paroxysmal atrial fibrillation no longer on anticoagulation, history of PE, hypothyroid, anemia, Parkinson's disease, vitamin B12 deficiency, right subclavian artery stenosis, who presented to [facility] ED via EMS from home for progressive weakness/lethargy, confusion.  Family reports decreased urine output and oral intake.  Patient unable to provide further information and only follows simple commands.  In the ED, temperature 100.5 F, HR 86, RR 23, BP 103/62, SpO2 95% on room air.  WBC 12.0, hemoglobin 9.2, platelet count 243.  Sodium 143, potassium 3.6, chloride 115, CO2 18, glucose 96, BUN 39, creatinine 0.71.  AST 23, ALT 7, total bilirubin 0.9.  High sensitive troponin 12.  Lactic acid 0.7> 0.8.  Urinalysis with small leukocytes, negative nitrite, greater than 300 protein, many bacteria, greater than 50 RBCs, greater than 50 WBCs.  Chest x-ray with no active cardiopulmonary disease process.  Blood cultures x 2 and urine culture obtained. The patient received 2000 mL of normal saline bolus, ceftriaxone 2 g IVPB, calcium gluconate 1 g IVPB and acetaminophen 650 mg suppository PR x 1.  Given history of ESBL, patient was started on meropenem.  TRH consulted for admission for further evaluation management of acute metabolic encephalopathy likely secondary to urinary tract infection.  Assessment & Plan:   Acute metabolic encephalopathy ESBL E. coli bacteremia E. coli/Proteus mirabilis UTI Hemorrhagic cystitis Patient presenting to ED with progressive weakness, lethargy, confusion.  Also with decreased oral intake, urine output.  History of previous ESBL E. coli urinary tract infections per review of MAR.  Patient with temperature 100.5 F on mission.  WBC  count elevated 20.0 with normal lactic acid.  Urinalysis consistent with UTI.  CT head without contrast with no acute intracranial abnormality. -- WBC 12.0>13.9>18.1>9.6>10.2 -- Blood cultures x 2: + GNR, BCID + ESBL Ecoli -- Urine culture: + Ecoli/Proteus Mirabilis -- Meropenem 1 g IV every 8 hours; plan 10 day course (Day #7)  Dysphagia -- Seen by speech therapy -- Dysphagia 3 diet -- Aspiration precautions; assist with feeding  Essential hypertension Paroxysmal atrial fibrillation no longer on anticoagulation -- Metoprolol tartrate 12.5 mg p.o. twice daily  Hyperlipidemia CAD -- Crestor 10 mg p.o. daily  Hypothyroidism -- Levothyroxine 100 mcg p.o. daily  History of anemia History of small bowel AVM -- Hgb 9.2>11.0>10.5>9.4>11.0; stable -- Transfuse for hemoglobin less than 7.0  GERD -- Protonix 40 mg p.o. daily as needed heartburn  History of syncope  Seen by cardiology previously, thought to be secondary to orthostasis; probably as well as autonomic dysfunction with history of Parkinson's disease. -- Supportive care, fall precautions  Parkinson disease with dementia Follows with neurology outpatient, Dr. Arbutus Leas.  Last seen in clinic on 08/30/2023.  Per neurology note, no ambulates much and memory has been very poor. -- Sinemet 4 times daily -- Melatonin 3 mg p.o. nightly -- Delirium precautions -- Get up during the day -- Encourage a familiar face to remain present throughout the day -- Keep blinds open and lights on during daylight hours -- Minimize the use of opioids/benzodiazepines  Weakness/debility/deconditioning: -- PT/OT recommend home health PT/OT -- Continue therapy efforts while inpatient   Pressure injury, right buttock, stage I sacrum, ischial tuberosity, POA Pressure Injury 11/29/23 Buttocks Right Deep Tissue Pressure Injury - Purple  or maroon localized area of discolored intact skin or blood-filled blister due to damage of underlying soft tissue from  pressure and/or shear. unblanchable area, dark in color w (Active)  11/29/23 0700  Location: Buttocks  Location Orientation: Right  Staging: Deep Tissue Pressure Injury - Purple or maroon localized area of discolored intact skin or blood-filled blister due to damage of underlying soft tissue from pressure and/or shear.  Wound Description (Comments): unblanchable area, dark in color with blistered skin  Present on Admission: Yes     Pressure Injury 11/29/23 Sacrum Mid Stage 1 -  Intact skin with non-blanchable redness of a localized area usually over a bony prominence. (Active)  11/29/23 0700  Location: Sacrum  Location Orientation: Mid  Staging: Stage 1 -  Intact skin with non-blanchable redness of a localized area usually over a bony prominence.  Wound Description (Comments):   Present on Admission: Yes     Pressure Injury 11/29/23 Ischial tuberosity Right Deep Tissue Pressure Injury - Purple or maroon localized area of discolored intact skin or blood-filled blister due to damage of underlying soft tissue from pressure and/or shear. (Active)  11/29/23 0700  Location: Ischial tuberosity  Location Orientation: Right  Staging: Deep Tissue Pressure Injury - Purple or maroon localized area of discolored intact skin or blood-filled blister due to damage of underlying soft tissue from pressure and/or shear.  Wound Description (Comments):   Present on Admission: Yes     Pressure Injury Ischial tuberosity Left Deep Tissue Pressure Injury - Purple or maroon localized area of discolored intact skin or blood-filled blister due to damage of underlying soft tissue from pressure and/or shear. (Active)     Location: Ischial tuberosity  Location Orientation: Left  Staging: Deep Tissue Pressure Injury - Purple or maroon localized area of discolored intact skin or blood-filled blister due to damage of underlying soft tissue from pressure and/or shear.  Wound Description (Comments):   Present on  Admission: Yes  Seen by wound RN, continue local wound care, offloading, air mattress     DVT prophylaxis: SCDs Start: 11/28/23 1400    Code Status: Full Code Family Communication: No family present at bedside this morning  Disposition Plan:  Level of care: Med-Surg Status is: Inpatient Remains inpatient appropriate because: IV antibiotics, pending PT/OT evaluation, further workup    Consultants:  None  Procedures:  None  Antimicrobials:  Ceftriaxone 3/11 - 3/11 Meropenem 3/11>>   Subjective: Patient seen examined bedside, lying in bed.  Alert, pleasantly confused.  No family present this morning. Denies pain, no shortness of breath, no chest pain.  No acute concerns overnight per nursing staff.  Remains on IV antibiotics with meropenem for ESBL infection, day #7 of 10.  Objective: Vitals:   12/03/23 0411 12/03/23 1327 12/03/23 1936 12/04/23 0528  BP: (!) 156/85 (!) 149/88 104/60 130/68  Pulse: 83 71 76 81  Resp: 18 16  16   Temp: 98 F (36.7 C) 98.2 F (36.8 C) 98.9 F (37.2 C) 98.2 F (36.8 C)  TempSrc: Oral Oral    SpO2: 98% 98% 98% 97%  Weight:      Height:        Intake/Output Summary (Last 24 hours) at 12/04/2023 1138 Last data filed at 12/04/2023 1000 Gross per 24 hour  Intake 540 ml  Output 790 ml  Net -250 ml   Filed Weights   11/29/23 1216  Weight: 72.3 kg    Examination:  Physical Exam: GEN: NAD, weak/lethargic, chronically ill appearance, appears older  than stated age HEENT: NCAT, PERRL, EOMI, sclera clear, dry mucous membranes PULM: CTAB w/o wheezes/crackles, normal respiratory effort, on room air CV: RRR w/o M/G/R GI: abd soft, NTND, + BS MSK: no peripheral edema Integumentary: No rashes/lesions/wounds nonexposed skin surfaces    Data Reviewed: I have personally reviewed following labs and imaging studies  CBC: Recent Labs  Lab 11/28/23 0944 11/29/23 0419 11/30/23 0431 12/01/23 0339 12/02/23 0504  WBC 12.0* 13.9* 10.7* 9.6  10.2  NEUTROABS 9.9*  --   --   --   --   HGB 9.2* 11.0* 10.5* 9.9* 11.0*  HCT 27.6* 33.8* 31.3* 30.1* 33.5*  MCV 81.2 83.0 80.1 80.1 80.7  PLT 243 278 294 333 386   Basic Metabolic Panel: Recent Labs  Lab 11/28/23 0944 11/29/23 0419 11/30/23 0431 12/01/23 0339 12/02/23 0504  NA 143 144 142 140 139  K 3.6 4.4 3.8 3.6 3.8  CL 115* 111 111 109 103  CO2 18* 20* 25 24 27   GLUCOSE 96 79 119* 88 91  BUN 39* 33* 29* 16 9  CREATININE 0.71 0.87 0.61 0.53 0.47  CALCIUM 6.5* 8.4* 8.3* 8.2* 8.9  MG  --   --  2.1  --  2.1  PHOS  --   --  2.0*  --   --    GFR: Estimated Creatinine Clearance: 60.1 mL/min (by C-G formula based on SCr of 0.47 mg/dL). Liver Function Tests: Recent Labs  Lab 11/28/23 0944 11/29/23 0419  AST 23 29  ALT 7 7  ALKPHOS 74 87  BILITOT 0.9 0.9  PROT 5.5* 6.4*  ALBUMIN 2.2* 2.5*   No results for input(s): "LIPASE", "AMYLASE" in the last 168 hours. No results for input(s): "AMMONIA" in the last 168 hours. Coagulation Profile: No results for input(s): "INR", "PROTIME" in the last 168 hours. Cardiac Enzymes: No results for input(s): "CKTOTAL", "CKMB", "CKMBINDEX", "TROPONINI" in the last 168 hours. BNP (last 3 results) No results for input(s): "PROBNP" in the last 8760 hours. HbA1C: No results for input(s): "HGBA1C" in the last 72 hours.  CBG: Recent Labs  Lab 12/03/23 0813 12/03/23 1150 12/03/23 1655 12/03/23 2053 12/04/23 0809  GLUCAP 82 100* 108* 153* 87   Lipid Profile: No results for input(s): "CHOL", "HDL", "LDLCALC", "TRIG", "CHOLHDL", "LDLDIRECT" in the last 72 hours. Thyroid Function Tests: No results for input(s): "TSH", "T4TOTAL", "FREET4", "T3FREE", "THYROIDAB" in the last 72 hours.  Anemia Panel: No results for input(s): "VITAMINB12", "FOLATE", "FERRITIN", "TIBC", "IRON", "RETICCTPCT" in the last 72 hours.  Sepsis Labs: Recent Labs  Lab 11/28/23 1033 11/28/23 1200  LATICACIDVEN 0.7 0.8    Recent Results (from the past 240  hours)  Resp panel by RT-PCR (RSV, Flu A&B, Covid)     Status: None   Collection Time: 11/28/23  9:20 AM   Specimen: Nasal Swab  Result Value Ref Range Status   SARS Coronavirus 2 by RT PCR NEGATIVE NEGATIVE Final    Comment: (NOTE) SARS-CoV-2 target nucleic acids are NOT DETECTED.  The SARS-CoV-2 RNA is generally detectable in upper respiratory specimens during the acute phase of infection. The lowest concentration of SARS-CoV-2 viral copies this assay can detect is 138 copies/mL. A negative result does not preclude SARS-Cov-2 infection and should not be used as the sole basis for treatment or other patient management decisions. A negative result may occur with  improper specimen collection/handling, submission of specimen other than nasopharyngeal swab, presence of viral mutation(s) within the areas targeted by this assay, and inadequate number  of viral copies(<138 copies/mL). A negative result must be combined with clinical observations, patient history, and epidemiological information. The expected result is Negative.  Fact Sheet for Patients:  BloggerCourse.com  Fact Sheet for Healthcare Providers:  SeriousBroker.it  This test is no t yet approved or cleared by the [place] FDA and  has been authorized for detection and/or diagnosis of SARS-CoV-2 by FDA under an Emergency Use Authorization (EUA). This EUA will remain  in effect (meaning this test can be used) for the duration of the COVID-19 declaration under Section 564(b)(1) of the Act, 21 U.S.C.section 360bbb-3(b)(1), unless the authorization is terminated  or revoked sooner.       Influenza A by PCR NEGATIVE NEGATIVE Final   Influenza B by PCR NEGATIVE NEGATIVE Final    Comment: (NOTE) The Xpert Xpress SARS-CoV-2/FLU/RSV plus assay is intended as an aid in the diagnosis of influenza from Nasopharyngeal swab specimens and should not be used as a sole basis for  treatment. Nasal washings and aspirates are unacceptable for Xpert Xpress SARS-CoV-2/FLU/RSV testing.  Fact Sheet for Patients: BloggerCourse.com  Fact Sheet for Healthcare Providers: SeriousBroker.it  This test is not yet approved or cleared by the [place] FDA and has been authorized for detection and/or diagnosis of SARS-CoV-2 by FDA under an Emergency Use Authorization (EUA). This EUA will remain in effect (meaning this test can be used) for the duration of the COVID-19 declaration under Section 564(b)(1) of the Act, 21 U.S.C. section 360bbb-3(b)(1), unless the authorization is terminated or revoked.     Resp Syncytial Virus by PCR NEGATIVE NEGATIVE Final    Comment: (NOTE) Fact Sheet for Patients: BloggerCourse.com  Fact Sheet for Healthcare Providers: SeriousBroker.it  This test is not yet approved or cleared by the [place] FDA and has been authorized for detection and/or diagnosis of SARS-CoV-2 by FDA under an Emergency Use Authorization (EUA). This EUA will remain in effect (meaning this test can be used) for the duration of the COVID-19 declaration under Section 564(b)(1) of the Act, 21 U.S.C. section 360bbb-3(b)(1), unless the authorization is terminated or revoked.  Performed at [facility], [address] [place], [place] [address]   Urine Culture (for pregnant, neutropenic or urologic patients or patients with an indwelling urinary catheter)     Status: Abnormal   Collection Time: 11/28/23  9:44 AM   Specimen: Urine, Clean Catch  Result Value Ref Range Status   Specimen Description   Final    URINE, CLEAN CATCH Performed at [facility], [address] [place], [place] [address]    Special Requests   Final    NONE Performed at [facility], [address], [place], [place] [address]     Culture (A)  Final    90,000 COLONIES/mL PROTEUS MIRABILIS >=100,000 COLONIES/mL ESCHERICHIA COLI Confirmed Extended Spectrum Beta-Lactamase Producer (ESBL).  In bloodstream infections from ESBL organisms, carbapenems are preferred over piperacillin/tazobactam. They are shown to have a lower risk of mortality.    Report Status 12/02/2023 FINAL  Final   Organism ID, Bacteria PROTEUS MIRABILIS (A)  Final   Organism ID, Bacteria ESCHERICHIA COLI (A)  Final      Susceptibility   Escherichia coli - MIC*    AMPICILLIN >=32 RESISTANT Resistant     CEFAZOLIN >=64 RESISTANT Resistant     CEFEPIME 16 RESISTANT Resistant     CEFTRIAXONE >=64 RESISTANT Resistant     CIPROFLOXACIN >=4 RESISTANT Resistant     GENTAMICIN >=16 RESISTANT  Resistant     IMIPENEM <=0.25 SENSITIVE Sensitive     NITROFURANTOIN <=16 SENSITIVE Sensitive     TRIMETH/SULFA >=320 RESISTANT Resistant     AMPICILLIN/SULBACTAM 4 SENSITIVE Sensitive     PIP/TAZO <=4 SENSITIVE Sensitive ug/mL    * >=100,000 COLONIES/mL ESCHERICHIA COLI   Proteus mirabilis - MIC*    AMPICILLIN <=2 SENSITIVE Sensitive     CEFAZOLIN <=4 SENSITIVE Sensitive     CEFEPIME <=0.12 SENSITIVE Sensitive     CEFTRIAXONE <=0.25 SENSITIVE Sensitive     CIPROFLOXACIN <=0.25 SENSITIVE Sensitive     GENTAMICIN <=1 SENSITIVE Sensitive     IMIPENEM 2 SENSITIVE Sensitive     NITROFURANTOIN 128 RESISTANT Resistant     TRIMETH/SULFA <=20 SENSITIVE Sensitive     AMPICILLIN/SULBACTAM <=2 SENSITIVE Sensitive     PIP/TAZO <=4 SENSITIVE Sensitive ug/mL    * 90,000 COLONIES/mL PROTEUS MIRABILIS  Culture, blood (routine x 2)     Status: None   Collection Time: 11/28/23 10:25 AM   Specimen: BLOOD RIGHT FOREARM  Result Value Ref Range Status   Specimen Description   Final    BLOOD RIGHT FOREARM Performed at [facility], [address], [place], [place] [address]    Special Requests   Final    BOTTLES DRAWN AEROBIC AND ANAEROBIC Blood Culture adequate  volume Performed at [facility], [address], [place], [place] [address]    Culture   Final    NO GROWTH 5 DAYS Performed at [facility], [address] [place], [place] [address]    Report Status 12/03/2023 FINAL  Final  Culture, blood (routine x 2)     Status: Abnormal   Collection Time: 11/28/23 10:25 AM   Specimen: BLOOD  Result Value Ref Range Status   Specimen Description   Final    BLOOD LEFT ANTECUBITAL Performed at [facility], [address] [place], [place] [address]    Special Requests   Final    BOTTLES DRAWN AEROBIC AND ANAEROBIC Blood Culture adequate volume Performed at [facility], [address] [place], [place] [address]    Culture  Setup Time   Final    GRAM NEGATIVE RODS AEROBIC BOTTLE ONLY CRITICAL RESULT CALLED TO, READ BACK BY AND VERIFIED WITH: PHARMD T.GREEN AT 1000 ON 11/30/2023 BY T.SAAD. Performed at [facility], [address], [place], [place] [address]    Culture (A)  Final    ESCHERICHIA COLI Confirmed Extended Spectrum Beta-Lactamase Producer (ESBL).  In bloodstream infections from ESBL organisms, carbapenems are preferred over piperacillin/tazobactam. They are shown to have a lower risk of mortality.    Report Status 12/02/2023 FINAL  Final   Organism ID, Bacteria ESCHERICHIA COLI  Final      Susceptibility   Escherichia coli - MIC*    AMPICILLIN >=32 RESISTANT Resistant     CEFEPIME 16 RESISTANT Resistant     CEFTAZIDIME RESISTANT Resistant     CEFTRIAXONE >=64 RESISTANT Resistant     CIPROFLOXACIN >=4 RESISTANT Resistant     GENTAMICIN <=1 SENSITIVE Sensitive     IMIPENEM <=0.25 SENSITIVE Sensitive     TRIMETH/SULFA >=320 RESISTANT Resistant     AMPICILLIN/SULBACTAM 4 SENSITIVE Sensitive     PIP/TAZO <=4 SENSITIVE Sensitive ug/mL    * ESCHERICHIA COLI  Blood Culture ID Panel (Reflexed)     Status: Abnormal   Collection Time: 11/28/23 10:25 AM  Result  Value Ref Range Status  Enterococcus faecalis NOT DETECTED NOT DETECTED Final   Enterococcus Faecium NOT DETECTED NOT DETECTED Final   Listeria monocytogenes NOT DETECTED NOT DETECTED Final   Staphylococcus species NOT DETECTED NOT DETECTED Final   Staphylococcus aureus (BCID) NOT DETECTED NOT DETECTED Final   Staphylococcus epidermidis NOT DETECTED NOT DETECTED Final   Staphylococcus lugdunensis NOT DETECTED NOT DETECTED Final   Streptococcus species NOT DETECTED NOT DETECTED Final   Streptococcus agalactiae NOT DETECTED NOT DETECTED Final   Streptococcus pneumoniae NOT DETECTED NOT DETECTED Final   Streptococcus pyogenes NOT DETECTED NOT DETECTED Final   A.calcoaceticus-baumannii NOT DETECTED NOT DETECTED Final   Bacteroides fragilis NOT DETECTED NOT DETECTED Final   Enterobacterales DETECTED (A) NOT DETECTED Final    Comment: Enterobacterales represent a large order of gram negative bacteria, not a single organism. CRITICAL RESULT CALLED TO, READ BACK BY AND VERIFIED WITH: PHARMD T.GREEN AT 1000 ON 11/30/2023 BY T.SAAD.    Enterobacter cloacae complex NOT DETECTED NOT DETECTED Final   Escherichia coli DETECTED (A) NOT DETECTED Final    Comment: CRITICAL RESULT CALLED TO, READ BACK BY AND VERIFIED WITH: PHARMD T.GREEN AT 1000 ON 11/30/2023 BY T.SAAD.    Klebsiella aerogenes NOT DETECTED NOT DETECTED Final   Klebsiella oxytoca NOT DETECTED NOT DETECTED Final   Klebsiella pneumoniae NOT DETECTED NOT DETECTED Final   Proteus species NOT DETECTED NOT DETECTED Final   Salmonella species NOT DETECTED NOT DETECTED Final   Serratia marcescens NOT DETECTED NOT DETECTED Final   Haemophilus influenzae NOT DETECTED NOT DETECTED Final   Neisseria meningitidis NOT DETECTED NOT DETECTED Final   Pseudomonas aeruginosa NOT DETECTED NOT DETECTED Final   Stenotrophomonas maltophilia NOT DETECTED NOT DETECTED Final   Candida albicans NOT DETECTED NOT DETECTED Final   Candida auris NOT DETECTED NOT  DETECTED Final   Candida glabrata NOT DETECTED NOT DETECTED Final   Candida krusei NOT DETECTED NOT DETECTED Final   Candida parapsilosis NOT DETECTED NOT DETECTED Final   Candida tropicalis NOT DETECTED NOT DETECTED Final   Cryptococcus neoformans/gattii NOT DETECTED NOT DETECTED Final   CTX-M ESBL DETECTED (A) NOT DETECTED Final    Comment: CRITICAL RESULT CALLED TO, READ BACK BY AND VERIFIED WITH: PHARMD T.GREEN AT 1000 ON 11/30/2023 BY T.SAAD. (NOTE) Extended spectrum beta-lactamase detected. Recommend a carbapenem as initial therapy.      Carbapenem resistance IMP NOT DETECTED NOT DETECTED Final   Carbapenem resistance KPC NOT DETECTED NOT DETECTED Final   Carbapenem resistance NDM NOT DETECTED NOT DETECTED Final   Carbapenem resist OXA 48 LIKE NOT DETECTED NOT DETECTED Final   Carbapenem resistance VIM NOT DETECTED NOT DETECTED Final    Comment: Performed at [facility], [address], [place], [place] [address]         Radiology Studies: No results found.       Scheduled Meds:  Carbidopa-Levodopa ER  1 tablet Oral Q2000   Carbidopa-Levodopa ER  2 tablet Oral TID PC   DULoxetine  20 mg Oral q morning   feeding supplement  237 mL Oral BID BM   levothyroxine  100 mcg Oral Q0600   metoprolol tartrate  12.5 mg Oral BID   rosuvastatin  10 mg Oral QPM   senna-docusate  1 tablet Oral QHS   Continuous Infusions:  meropenem (MERREM) IV 1 g (12/04/23 0506)     LOS: 6 days    Time spent: 48 minutes spent on 12/04/2023 caring for this patient face-to-face including chart review, ordering labs/tests, documenting,  discussion with nursing staff, consultants, updating family and interview/physical exam    Alvira Philips [place], DO [facility] Available via Epic secure chat 7am-7pm After these hours, please refer to coverage provider listed on [email or web address] 12/04/2023, 11:38 AM

## 2023-12-05 DIAGNOSIS — N390 Urinary tract infection, site not specified: Secondary | ICD-10-CM | POA: Diagnosis not present

## 2023-12-05 LAB — GLUCOSE, CAPILLARY
Glucose-Capillary: 85 mg/dL (ref 70–99)
Glucose-Capillary: 111 mg/dL — ABNORMAL HIGH (ref 70–99)
Glucose-Capillary: 139 mg/dL — ABNORMAL HIGH (ref 70–99)
Glucose-Capillary: 104 mg/dL — ABNORMAL HIGH (ref 70–99)

## 2023-12-05 MED ORDER — POLYETHYLENE GLYCOL 3350 17 G PO PACK
17.0000 g | PACK | Freq: Every day | ORAL | Status: DC | PRN
  Administered 2023-12-05 – 2023-12-08 (×2): 17 g via ORAL
  Filled 2023-12-05 (×2): qty 1

## 2023-12-05 NOTE — Plan of Care (Signed)

## 2023-12-05 NOTE — Progress Notes (Signed)
 PROGRESS NOTE    Anna Bernard  UXL:[id number] DOB: May 31, 1951 DOA: 11/28/2023 PCP: Billey Co, MD    Brief Narrative:   Anna Bernard is a 73 y.o. female with past medical history significant for HTN, HLD, CAD, hypothyroidism, paroxysmal atrial fibrillation no longer on anticoagulation, history of PE, hypothyroid, anemia, Parkinson's disease, vitamin B12 deficiency, right subclavian artery stenosis, who presented to [facility] ED via EMS from home for progressive weakness/lethargy, confusion.  Family reports decreased urine output and oral intake.  Patient unable to provide further information and only follows simple commands.  In the ED, temperature 100.5 F, HR 86, RR 23, BP 103/62, SpO2 95% on room air.  WBC 12.0, hemoglobin 9.2, platelet count 243.  Sodium 143, potassium 3.6, chloride 115, CO2 18, glucose 96, BUN 39, creatinine 0.71.  AST 23, ALT 7, total bilirubin 0.9.  High sensitive troponin 12.  Lactic acid 0.7> 0.8.  Urinalysis with small leukocytes, negative nitrite, greater than 300 protein, many bacteria, greater than 50 RBCs, greater than 50 WBCs.  Chest x-ray with no active cardiopulmonary disease process.  Blood cultures x 2 and urine culture obtained. The patient received 2000 mL of normal saline bolus, ceftriaxone 2 g IVPB, calcium gluconate 1 g IVPB and acetaminophen 650 mg suppository PR x 1.  Given history of ESBL, patient was started on meropenem.  TRH consulted for admission for further evaluation management of acute metabolic encephalopathy likely secondary to urinary tract infection.  Assessment & Plan:   Acute metabolic encephalopathy ESBL E. coli bacteremia E. coli/Proteus mirabilis UTI Hemorrhagic cystitis Patient presenting to ED with progressive weakness, lethargy, confusion.  Also with decreased oral intake, urine output.  History of previous ESBL E. coli urinary tract infections per review of MAR.  Patient with temperature 100.5 F on mission.  WBC  count elevated 20.0 with normal lactic acid.  Urinalysis consistent with UTI.  CT head without contrast with no acute intracranial abnormality. -- WBC 12.0>13.9>18.1>9.6>10.2 -- Blood cultures x 2: + GNR, BCID + ESBL Ecoli -- Urine culture: + Ecoli/Proteus Mirabilis -- Meropenem 1 g IV every 8 hours; plan 10 day course (Day #8)  Dysphagia -- Seen by speech therapy -- Dysphagia 3 diet -- Aspiration precautions; assist with feeding  Essential hypertension Paroxysmal atrial fibrillation no longer on anticoagulation -- Metoprolol tartrate 12.5 mg p.o. twice daily  Hyperlipidemia CAD -- Crestor 10 mg p.o. daily  Hypothyroidism -- Levothyroxine 100 mcg p.o. daily  History of anemia History of small bowel AVM -- Hgb 9.2>11.0>10.5>9.4>11.0; stable -- Transfuse for hemoglobin less than 7.0  GERD -- Protonix 40 mg p.o. daily as needed heartburn  History of syncope  Seen by cardiology previously, thought to be secondary to orthostasis; probably as well as autonomic dysfunction with history of Parkinson's disease. -- Supportive care, fall precautions  Parkinson disease with dementia Follows with neurology outpatient, Dr. Arbutus Leas.  Last seen in clinic on 08/30/2023.  Per neurology note, no ambulates much and memory has been very poor. -- Sinemet 4 times daily -- Melatonin 3 mg p.o. nightly -- Delirium precautions -- Get up during the day -- Encourage a familiar face to remain present throughout the day -- Keep blinds open and lights on during daylight hours -- Minimize the use of opioids/benzodiazepines  Weakness/debility/deconditioning: -- PT/OT recommend home health PT/OT -- Continue therapy efforts while inpatient   Pressure injury, right buttock, stage I sacrum, ischial tuberosity, POA Pressure Injury 11/29/23 Buttocks Right Deep Tissue Pressure Injury - Purple  or maroon localized area of discolored intact skin or blood-filled blister due to damage of underlying soft tissue from  pressure and/or shear. unblanchable area, dark in color w (Active)  11/29/23 0700  Location: Buttocks  Location Orientation: Right  Staging: Deep Tissue Pressure Injury - Purple or maroon localized area of discolored intact skin or blood-filled blister due to damage of underlying soft tissue from pressure and/or shear.  Wound Description (Comments): unblanchable area, dark in color with blistered skin  Present on Admission: Yes     Pressure Injury 11/29/23 Sacrum Mid Stage 1 -  Intact skin with non-blanchable redness of a localized area usually over a bony prominence. (Active)  11/29/23 0700  Location: Sacrum  Location Orientation: Mid  Staging: Stage 1 -  Intact skin with non-blanchable redness of a localized area usually over a bony prominence.  Wound Description (Comments):   Present on Admission: Yes     Pressure Injury 11/29/23 Ischial tuberosity Right Deep Tissue Pressure Injury - Purple or maroon localized area of discolored intact skin or blood-filled blister due to damage of underlying soft tissue from pressure and/or shear. (Active)  11/29/23 0700  Location: Ischial tuberosity  Location Orientation: Right  Staging: Deep Tissue Pressure Injury - Purple or maroon localized area of discolored intact skin or blood-filled blister due to damage of underlying soft tissue from pressure and/or shear.  Wound Description (Comments):   Present on Admission: Yes     Pressure Injury Ischial tuberosity Left Deep Tissue Pressure Injury - Purple or maroon localized area of discolored intact skin or blood-filled blister due to damage of underlying soft tissue from pressure and/or shear. (Active)     Location: Ischial tuberosity  Location Orientation: Left  Staging: Deep Tissue Pressure Injury - Purple or maroon localized area of discolored intact skin or blood-filled blister due to damage of underlying soft tissue from pressure and/or shear.  Wound Description (Comments):   Present on  Admission: Yes  Seen by wound RN, continue local wound care, offloading, air mattress     DVT prophylaxis: SCDs Start: 11/28/23 1400    Code Status: Full Code Family Communication: No family present at bedside this morning  Disposition Plan:  Level of care: Med-Surg Status is: Inpatient Remains inpatient appropriate because: IV antibiotics, pending PT/OT evaluation, further workup    Consultants:  None  Procedures:  None  Antimicrobials:  Ceftriaxone 3/11 - 3/11 Meropenem 3/11>>   Subjective: Patient seen examined bedside, lying in bed.  Alert, pleasantly confused.  No family present this morning. Denies pain, no shortness of breath, no chest pain.  No acute concerns overnight per nursing staff.  Remains on IV antibiotics with meropenem for ESBL infection, day #7 of 10.  Objective: Vitals:   12/04/23 2058 12/05/23 0435 12/05/23 1011 12/05/23 1149  BP: 135/74 139/65 (!) 145/82 (!) 141/73  Pulse: 85 79 75 83  Resp: 20 20  18   Temp: 97.7 F (36.5 C) (!) 97.5 F (36.4 C)  97.9 F (36.6 C)  TempSrc: Oral Oral    SpO2: 100% 98%  99%  Weight:      Height:        Intake/Output Summary (Last 24 hours) at 12/05/2023 1258 Last data filed at 12/05/2023 0900 Gross per 24 hour  Intake 680 ml  Output 875 ml  Net -195 ml   Filed Weights   11/29/23 1216  Weight: 72.3 kg    Examination:  Physical Exam: GEN: NAD, weak/lethargic, chronically ill appearance, appears older than stated  age HEENT: NCAT, PERRL, EOMI, sclera clear, dry mucous membranes PULM: CTAB w/o wheezes/crackles, normal respiratory effort, on room air CV: RRR w/o M/G/R GI: abd soft, NTND, + BS MSK: no peripheral edema Integumentary: No rashes/lesions/wounds nonexposed skin surfaces    Data Reviewed: I have personally reviewed following labs and imaging studies  CBC: Recent Labs  Lab 11/29/23 0419 11/30/23 0431 12/01/23 0339 12/02/23 0504  WBC 13.9* 10.7* 9.6 10.2  HGB 11.0* 10.5* 9.9* 11.0*   HCT 33.8* 31.3* 30.1* 33.5*  MCV 83.0 80.1 80.1 80.7  PLT 278 294 333 386   Basic Metabolic Panel: Recent Labs  Lab 11/29/23 0419 11/30/23 0431 12/01/23 0339 12/02/23 0504  NA 144 142 140 139  K 4.4 3.8 3.6 3.8  CL 111 111 109 103  CO2 20* 25 24 27   GLUCOSE 79 119* 88 91  BUN 33* 29* 16 9  CREATININE 0.87 0.61 0.53 0.47  CALCIUM 8.4* 8.3* 8.2* 8.9  MG  --  2.1  --  2.1  PHOS  --  2.0*  --   --    GFR: Estimated Creatinine Clearance: 60.1 mL/min (by C-G formula based on SCr of 0.47 mg/dL). Liver Function Tests: Recent Labs  Lab 11/29/23 0419  AST 29  ALT 7  ALKPHOS 87  BILITOT 0.9  PROT 6.4*  ALBUMIN 2.5*   No results for input(s): "LIPASE", "AMYLASE" in the last 168 hours. No results for input(s): "AMMONIA" in the last 168 hours. Coagulation Profile: No results for input(s): "INR", "PROTIME" in the last 168 hours. Cardiac Enzymes: No results for input(s): "CKTOTAL", "CKMB", "CKMBINDEX", "TROPONINI" in the last 168 hours. BNP (last 3 results) No results for input(s): "PROBNP" in the last 8760 hours. HbA1C: No results for input(s): "HGBA1C" in the last 72 hours.  CBG: Recent Labs  Lab 12/04/23 1242 12/04/23 1727 12/04/23 2056 12/05/23 0736 12/05/23 1150  GLUCAP 105* 127* 98 85 111*   Lipid Profile: No results for input(s): "CHOL", "HDL", "LDLCALC", "TRIG", "CHOLHDL", "LDLDIRECT" in the last 72 hours. Thyroid Function Tests: No results for input(s): "TSH", "T4TOTAL", "FREET4", "T3FREE", "THYROIDAB" in the last 72 hours.  Anemia Panel: No results for input(s): "VITAMINB12", "FOLATE", "FERRITIN", "TIBC", "IRON", "RETICCTPCT" in the last 72 hours.  Sepsis Labs: No results for input(s): "PROCALCITON", "LATICACIDVEN" in the last 168 hours.   Recent Results (from the past 240 hours)  Resp panel by RT-PCR (RSV, Flu A&B, Covid)     Status: None   Collection Time: 11/28/23  9:20 AM   Specimen: Nasal Swab  Result Value Ref Range Status   SARS Coronavirus  2 by RT PCR NEGATIVE NEGATIVE Final    Comment: (NOTE) SARS-CoV-2 target nucleic acids are NOT DETECTED.  The SARS-CoV-2 RNA is generally detectable in upper respiratory specimens during the acute phase of infection. The lowest concentration of SARS-CoV-2 viral copies this assay can detect is 138 copies/mL. A negative result does not preclude SARS-Cov-2 infection and should not be used as the sole basis for treatment or other patient management decisions. A negative result may occur with  improper specimen collection/handling, submission of specimen other than nasopharyngeal swab, presence of viral mutation(s) within the areas targeted by this assay, and inadequate number of viral copies(<138 copies/mL). A negative result must be combined with clinical observations, patient history, and epidemiological information. The expected result is Negative.  Fact Sheet for Patients:  BloggerCourse.com  Fact Sheet for Healthcare Providers:  SeriousBroker.it  This test is no t yet approved or cleared by the  [place] [employer] and  has been authorized for detection and/or diagnosis of SARS-CoV-2 by FDA under an TEFL teacher (EUA). This EUA will remain  in effect (meaning this test can be used) for the duration of the COVID-19 declaration under Section 564(b)(1) of the Act, 21 U.S.C.section 360bbb-3(b)(1), unless the authorization is terminated  or revoked sooner.       Influenza A by PCR NEGATIVE NEGATIVE Final   Influenza B by PCR NEGATIVE NEGATIVE Final    Comment: (NOTE) The Xpert Xpress SARS-CoV-2/FLU/RSV plus assay is intended as an aid in the diagnosis of influenza from Nasopharyngeal swab specimens and should not be used as a sole basis for treatment. Nasal washings and aspirates are unacceptable for Xpert Xpress SARS-CoV-2/FLU/RSV testing.  Fact Sheet for Patients: BloggerCourse.com  Fact  Sheet for Healthcare Providers: SeriousBroker.it  This test is not yet approved or cleared by the [place] FDA and has been authorized for detection and/or diagnosis of SARS-CoV-2 by FDA under an Emergency Use Authorization (EUA). This EUA will remain in effect (meaning this test can be used) for the duration of the COVID-19 declaration under Section 564(b)(1) of the Act, 21 U.S.C. section 360bbb-3(b)(1), unless the authorization is terminated or revoked.     Resp Syncytial Virus by PCR NEGATIVE NEGATIVE Final    Comment: (NOTE) Fact Sheet for Patients: BloggerCourse.com  Fact Sheet for Healthcare Providers: SeriousBroker.it  This test is not yet approved or cleared by the [place] FDA and has been authorized for detection and/or diagnosis of SARS-CoV-2 by FDA under an Emergency Use Authorization (EUA). This EUA will remain in effect (meaning this test can be used) for the duration of the COVID-19 declaration under Section 564(b)(1) of the Act, 21 U.S.C. section 360bbb-3(b)(1), unless the authorization is terminated or revoked.  Performed at [facility], [address], [place], [place] [address]   Urine Culture (for pregnant, neutropenic or urologic patients or patients with an indwelling urinary catheter)     Status: Abnormal   Collection Time: 11/28/23  9:44 AM   Specimen: Urine, Clean Catch  Result Value Ref Range Status   Specimen Description   Final    URINE, CLEAN CATCH Performed at [facility], [address] [place], [place] [address]    Special Requests   Final    NONE Performed at [facility], [address] [place], [place] [address]    Culture (A)  Final    90,000 COLONIES/mL PROTEUS MIRABILIS >=100,000 COLONIES/mL ESCHERICHIA COLI Confirmed Extended Spectrum Beta-Lactamase Producer (ESBL).  In bloodstream  infections from ESBL organisms, carbapenems are preferred over piperacillin/tazobactam. They are shown to have a lower risk of mortality.    Report Status 12/02/2023 FINAL  Final   Organism ID, Bacteria PROTEUS MIRABILIS (A)  Final   Organism ID, Bacteria ESCHERICHIA COLI (A)  Final      Susceptibility   Escherichia coli - MIC*    AMPICILLIN >=32 RESISTANT Resistant     CEFAZOLIN >=64 RESISTANT Resistant     CEFEPIME 16 RESISTANT Resistant     CEFTRIAXONE >=64 RESISTANT Resistant     CIPROFLOXACIN >=4 RESISTANT Resistant     GENTAMICIN >=16 RESISTANT Resistant     IMIPENEM <=0.25 SENSITIVE Sensitive     NITROFURANTOIN <=16 SENSITIVE Sensitive     TRIMETH/SULFA >=320 RESISTANT Resistant     AMPICILLIN/SULBACTAM 4 SENSITIVE Sensitive     PIP/TAZO <=4 SENSITIVE Sensitive ug/mL    * >=100,000 COLONIES/mL ESCHERICHIA COLI  Proteus mirabilis - MIC*    AMPICILLIN <=2 SENSITIVE Sensitive     CEFAZOLIN <=4 SENSITIVE Sensitive     CEFEPIME <=0.12 SENSITIVE Sensitive     CEFTRIAXONE <=0.25 SENSITIVE Sensitive     CIPROFLOXACIN <=0.25 SENSITIVE Sensitive     GENTAMICIN <=1 SENSITIVE Sensitive     IMIPENEM 2 SENSITIVE Sensitive     NITROFURANTOIN 128 RESISTANT Resistant     TRIMETH/SULFA <=20 SENSITIVE Sensitive     AMPICILLIN/SULBACTAM <=2 SENSITIVE Sensitive     PIP/TAZO <=4 SENSITIVE Sensitive ug/mL    * 90,000 COLONIES/mL PROTEUS MIRABILIS  Culture, blood (routine x 2)     Status: None   Collection Time: 11/28/23 10:25 AM   Specimen: BLOOD RIGHT FOREARM  Result Value Ref Range Status   Specimen Description   Final    BLOOD RIGHT FOREARM Performed at [facility], [address], [place], [place] [address]    Special Requests   Final    BOTTLES DRAWN AEROBIC AND ANAEROBIC Blood Culture adequate volume Performed at [facility], [address] [place], [place] [address]    Culture   Final    NO GROWTH 5 DAYS Performed at [facility], [address] [place], [place] [address]    Report Status 12/03/2023 FINAL  Final  Culture, blood (routine x 2)     Status: Abnormal   Collection Time: 11/28/23 10:25 AM   Specimen: BLOOD  Result Value Ref Range Status   Specimen Description   Final    BLOOD LEFT ANTECUBITAL Performed at [facility], [address] [place], [place] [address]    Special Requests   Final    BOTTLES DRAWN AEROBIC AND ANAEROBIC Blood Culture adequate volume Performed at [facility], [address] [place], [place] [address]    Culture  Setup Time   Final    GRAM NEGATIVE RODS AEROBIC BOTTLE ONLY CRITICAL RESULT CALLED TO, READ BACK BY AND VERIFIED WITH: PHARMD T.GREEN AT 1000 ON 11/30/2023 BY T.SAAD. Performed at [facility], [address], [place], [place] [address]    Culture (A)  Final    ESCHERICHIA COLI Confirmed Extended Spectrum Beta-Lactamase Producer (ESBL).  In bloodstream infections from ESBL organisms, carbapenems are preferred over piperacillin/tazobactam. They are shown to have a lower risk of mortality.    Report Status 12/02/2023 FINAL  Final   Organism ID, Bacteria ESCHERICHIA COLI  Final      Susceptibility   Escherichia coli - MIC*    AMPICILLIN >=32 RESISTANT Resistant     CEFEPIME 16 RESISTANT Resistant     CEFTAZIDIME RESISTANT Resistant     CEFTRIAXONE >=64 RESISTANT Resistant     CIPROFLOXACIN >=4 RESISTANT Resistant     GENTAMICIN <=1 SENSITIVE Sensitive     IMIPENEM <=0.25 SENSITIVE Sensitive     TRIMETH/SULFA >=320 RESISTANT Resistant     AMPICILLIN/SULBACTAM 4 SENSITIVE Sensitive     PIP/TAZO <=4 SENSITIVE Sensitive ug/mL    * ESCHERICHIA COLI  Blood Culture ID Panel (Reflexed)     Status: Abnormal   Collection Time: 11/28/23 10:25 AM  Result Value Ref Range Status   Enterococcus faecalis NOT DETECTED NOT DETECTED Final   Enterococcus Faecium NOT DETECTED NOT DETECTED Final   Listeria monocytogenes NOT DETECTED NOT DETECTED  Final   Staphylococcus species NOT DETECTED NOT DETECTED Final   Staphylococcus aureus (BCID) NOT DETECTED NOT DETECTED Final   Staphylococcus epidermidis NOT DETECTED NOT DETECTED  Final   Staphylococcus lugdunensis NOT DETECTED NOT DETECTED Final   Streptococcus species NOT DETECTED NOT DETECTED Final   Streptococcus agalactiae NOT DETECTED NOT DETECTED Final   Streptococcus pneumoniae NOT DETECTED NOT DETECTED Final   Streptococcus pyogenes NOT DETECTED NOT DETECTED Final   A.calcoaceticus-baumannii NOT DETECTED NOT DETECTED Final   Bacteroides fragilis NOT DETECTED NOT DETECTED Final   Enterobacterales DETECTED (A) NOT DETECTED Final    Comment: Enterobacterales represent a large order of gram negative bacteria, not a single organism. CRITICAL RESULT CALLED TO, READ BACK BY AND VERIFIED WITH: PHARMD T.GREEN AT 1000 ON 11/30/2023 BY T.SAAD.    Enterobacter cloacae complex NOT DETECTED NOT DETECTED Final   Escherichia coli DETECTED (A) NOT DETECTED Final    Comment: CRITICAL RESULT CALLED TO, READ BACK BY AND VERIFIED WITH: PHARMD T.GREEN AT 1000 ON 11/30/2023 BY T.SAAD.    Klebsiella aerogenes NOT DETECTED NOT DETECTED Final   Klebsiella oxytoca NOT DETECTED NOT DETECTED Final   Klebsiella pneumoniae NOT DETECTED NOT DETECTED Final   Proteus species NOT DETECTED NOT DETECTED Final   Salmonella species NOT DETECTED NOT DETECTED Final   Serratia marcescens NOT DETECTED NOT DETECTED Final   Haemophilus influenzae NOT DETECTED NOT DETECTED Final   Neisseria meningitidis NOT DETECTED NOT DETECTED Final   Pseudomonas aeruginosa NOT DETECTED NOT DETECTED Final   Stenotrophomonas maltophilia NOT DETECTED NOT DETECTED Final   Candida albicans NOT DETECTED NOT DETECTED Final   Candida auris NOT DETECTED NOT DETECTED Final   Candida glabrata NOT DETECTED NOT DETECTED Final   Candida krusei NOT DETECTED NOT DETECTED Final   Candida parapsilosis NOT DETECTED NOT DETECTED Final   Candida  tropicalis NOT DETECTED NOT DETECTED Final   Cryptococcus neoformans/gattii NOT DETECTED NOT DETECTED Final   CTX-M ESBL DETECTED (A) NOT DETECTED Final    Comment: CRITICAL RESULT CALLED TO, READ BACK BY AND VERIFIED WITH: PHARMD T.GREEN AT 1000 ON 11/30/2023 BY T.SAAD. (NOTE) Extended spectrum beta-lactamase detected. Recommend a carbapenem as initial therapy.      Carbapenem resistance IMP NOT DETECTED NOT DETECTED Final   Carbapenem resistance KPC NOT DETECTED NOT DETECTED Final   Carbapenem resistance NDM NOT DETECTED NOT DETECTED Final   Carbapenem resist OXA 48 LIKE NOT DETECTED NOT DETECTED Final   Carbapenem resistance VIM NOT DETECTED NOT DETECTED Final    Comment: Performed at [facility], [address] [place], [place] [address]         Radiology Studies: No results found.       Scheduled Meds:  Carbidopa-Levodopa ER  1 tablet Oral Q2000   Carbidopa-Levodopa ER  2 tablet Oral TID PC   DULoxetine  20 mg Oral q morning   feeding supplement  237 mL Oral BID BM   levothyroxine  100 mcg Oral Q0600   metoprolol tartrate  12.5 mg Oral BID   rosuvastatin  10 mg Oral QPM   senna-docusate  1 tablet Oral QHS   Continuous Infusions:  meropenem (MERREM) IV 1 g (12/05/23 0514)     LOS: 7 days    Time spent: 48 minutes spent on 12/05/2023 caring for this patient face-to-face including chart review, ordering labs/tests, documenting, discussion with nursing staff, consultants, updating family and interview/physical exam    Alvira Philips [place], DO [facility] Available via Epic secure chat 7am-7pm After these hours, please refer to coverage provider listed on [email or web address] 12/05/2023, 12:58 PM

## 2023-12-06 DIAGNOSIS — N390 Urinary tract infection, site not specified: Secondary | ICD-10-CM | POA: Diagnosis not present

## 2023-12-06 LAB — BASIC METABOLIC PANEL
Anion gap: 7 (ref 5–15)
BUN: 11 mg/dL (ref 8–23)
CO2: 30 mmol/L (ref 22–32)
Calcium: 8.8 mg/dL — ABNORMAL LOW (ref 8.9–10.3)
Chloride: 103 mmol/L (ref 98–111)
Creatinine, Ser: 0.44 mg/dL (ref 0.44–1.00)
GFR, Estimated: >60 mL/min (ref >60)
Glucose, Bld: 81 mg/dL (ref 70–99)
Potassium: 4 mmol/L (ref 3.5–5.1)
Sodium: 140 mmol/L (ref 135–145)

## 2023-12-06 LAB — GLUCOSE, CAPILLARY
Glucose-Capillary: 96 mg/dL (ref 70–99)
Glucose-Capillary: 128 mg/dL — ABNORMAL HIGH (ref 70–99)
Glucose-Capillary: 105 mg/dL — ABNORMAL HIGH (ref 70–99)
Glucose-Capillary: 153 mg/dL — ABNORMAL HIGH (ref 70–99)

## 2023-12-06 LAB — CBC
HCT: 30.7 % — ABNORMAL LOW (ref 36.0–46.0)
Hemoglobin: 10.2 g/dL — ABNORMAL LOW (ref 12.0–15.0)
MCH: 26.6 pg (ref 26.0–34.0)
MCHC: 33.2 g/dL (ref 30.0–36.0)
MCV: 79.9 fL — ABNORMAL LOW (ref 80.0–100.0)
Platelets: 492 10*3/uL — ABNORMAL HIGH (ref 150–400)
RBC: 3.84 MIL/uL — ABNORMAL LOW (ref 3.87–5.11)
RDW: 13.8 % (ref 11.5–15.5)
WBC: 7.4 10*3/uL (ref 4.0–10.5)
nRBC: 0 % (ref 0.0–0.2)

## 2023-12-06 LAB — VITAMIN D 25 HYDROXY (VIT D DEFICIENCY, FRACTURES): Vit D, 25-Hydroxy: 45.35 ng/mL (ref 30–100)

## 2023-12-06 MED ORDER — POLYSACCHARIDE IRON COMPLEX 150 MG PO CAPS
150.0000 mg | ORAL_CAPSULE | Freq: Every day | ORAL | Status: DC
  Administered 2023-12-06 – 2023-12-08 (×3): 150 mg via ORAL
  Filled 2023-12-06 (×3): qty 1

## 2023-12-06 MED ORDER — VITAMIN C 500 MG PO TABS
500.0000 mg | ORAL_TABLET | Freq: Every day | ORAL | Status: DC
  Administered 2023-12-06 – 2023-12-08 (×3): 500 mg via ORAL
  Filled 2023-12-06 (×3): qty 1

## 2023-12-06 NOTE — Progress Notes (Signed)
 PROGRESS NOTE    Anna Bernard  ION:[id number] DOB: 1951/04/30 DOA: 11/28/2023 PCP: Billey Co, MD    Brief Narrative:   Anna Bernard is a 73 y.o. female with past medical history significant for HTN, HLD, CAD, hypothyroidism, paroxysmal atrial fibrillation no longer on anticoagulation, history of PE, hypothyroid, anemia, Parkinson's disease, vitamin B12 deficiency, right subclavian artery stenosis, who presented to [facility] ED via EMS from home for progressive weakness/lethargy, confusion.  Family reports decreased urine output and oral intake.  Patient unable to provide further information and only follows simple commands.  In the ED, temperature 100.5 F, HR 86, RR 23, BP 103/62, SpO2 95% on room air.  WBC 12.0, hemoglobin 9.2, platelet count 243.  Sodium 143, potassium 3.6, chloride 115, CO2 18, glucose 96, BUN 39, creatinine 0.71.  AST 23, ALT 7, total bilirubin 0.9.  High sensitive troponin 12.  Lactic acid 0.7> 0.8.  Urinalysis with small leukocytes, negative nitrite, greater than 300 protein, many bacteria, greater than 50 RBCs, greater than 50 WBCs.  Chest x-ray with no active cardiopulmonary disease process.  Blood cultures x 2 and urine culture obtained. The patient received 2000 mL of normal saline bolus, ceftriaxone 2 g IVPB, calcium gluconate 1 g IVPB and acetaminophen 650 mg suppository PR x 1.  Given history of ESBL, patient was started on meropenem.  TRH consulted for admission for further evaluation management of acute metabolic encephalopathy likely secondary to urinary tract infection.  Assessment & Plan:   Acute metabolic encephalopathy ESBL E. coli bacteremia E. coli/Proteus mirabilis UTI Hemorrhagic cystitis Patient presenting to ED with progressive weakness, lethargy, confusion.  Also with decreased oral intake, urine output.  History of previous ESBL E. coli urinary tract infections per review of MAR.  Patient with temperature 100.5 F on mission.  WBC  count elevated 20.0 with normal lactic acid.  Urinalysis consistent with UTI.  CT head without contrast with no acute intracranial abnormality. -- WBC 12.0>13.9>18.1>9.6>7.4 -- Blood cultures x 2: + GNR, BCID + ESBL Ecoli -- Urine culture: + Ecoli/Proteus Mirabilis -- Meropenem 1 g IV every 8 hours; plan 10 day course (Day #8)  Dysphagia -- Seen by speech therapy -- Dysphagia 3 diet -- Aspiration precautions; assist with feeding  Essential hypertension Paroxysmal atrial fibrillation no longer on anticoagulation -- Metoprolol tartrate 12.5 mg p.o. twice daily  Hyperlipidemia CAD -- Crestor 10 mg p.o. daily  Hypothyroidism -- Levothyroxine 100 mcg p.o. daily  History of anemia History of small bowel AVM -- Hgb 9.2>11.0>10.5>9.4>10.2; stable -- Transfuse for hemoglobin less than 7.0  GERD -- Protonix 40 mg p.o. daily as needed heartburn  History of syncope  Seen by cardiology previously, thought to be secondary to orthostasis; probably as well as autonomic dysfunction with history of Parkinson's disease. -- Supportive care, fall precautions  Parkinson disease with dementia Follows with neurology outpatient, Dr. Arbutus Leas.  Last seen in clinic on 08/30/2023.  Per neurology note, no ambulates much and memory has been very poor. -- Sinemet 4 times daily -- Melatonin 3 mg p.o. nightly -- Delirium precautions -- Get up during the day -- Encourage a familiar face to remain present throughout the day -- Keep blinds open and lights on during daylight hours -- Minimize the use of opioids/benzodiazepines  Weakness/debility/deconditioning: -- PT/OT recommend home health PT/OT -- Continue therapy efforts while inpatient Low calcium could be due to low albumin versus poor nutrition.  Vitamin D within normal range   Pressure injury, right buttock,  stage I sacrum, ischial tuberosity, POA Pressure Injury 11/29/23 Buttocks Right Deep Tissue Pressure Injury - Purple or maroon localized area  of discolored intact skin or blood-filled blister due to damage of underlying soft tissue from pressure and/or shear. unblanchable area, dark in color w (Active)  11/29/23 0700  Location: Buttocks  Location Orientation: Right  Staging: Deep Tissue Pressure Injury - Purple or maroon localized area of discolored intact skin or blood-filled blister due to damage of underlying soft tissue from pressure and/or shear.  Wound Description (Comments): unblanchable area, dark in color with blistered skin  Present on Admission: Yes     Pressure Injury 11/29/23 Sacrum Mid Stage 1 -  Intact skin with non-blanchable redness of a localized area usually over a bony prominence. (Active)  11/29/23 0700  Location: Sacrum  Location Orientation: Mid  Staging: Stage 1 -  Intact skin with non-blanchable redness of a localized area usually over a bony prominence.  Wound Description (Comments):   Present on Admission: Yes     Pressure Injury 11/29/23 Ischial tuberosity Right Deep Tissue Pressure Injury - Purple or maroon localized area of discolored intact skin or blood-filled blister due to damage of underlying soft tissue from pressure and/or shear. (Active)  11/29/23 0700  Location: Ischial tuberosity  Location Orientation: Right  Staging: Deep Tissue Pressure Injury - Purple or maroon localized area of discolored intact skin or blood-filled blister due to damage of underlying soft tissue from pressure and/or shear.  Wound Description (Comments):   Present on Admission: Yes     Pressure Injury Ischial tuberosity Left Deep Tissue Pressure Injury - Purple or maroon localized area of discolored intact skin or blood-filled blister due to damage of underlying soft tissue from pressure and/or shear. (Active)     Location: Ischial tuberosity  Location Orientation: Left  Staging: Deep Tissue Pressure Injury - Purple or maroon localized area of discolored intact skin or blood-filled blister due to damage of underlying  soft tissue from pressure and/or shear.  Wound Description (Comments):   Present on Admission: Yes  Seen by wound RN, continue local wound care, offloading, air mattress     DVT prophylaxis: SCDs Start: 11/28/23 1400    Code Status: Full Code Family Communication: No family present at bedside this morning  Disposition Plan:  Level of care: Med-Surg Status is: Inpatient Remains inpatient appropriate because: IV antibiotics, pending PT/OT evaluation, further workup    Consultants:  None  Procedures:  None  Antimicrobials:  Ceftriaxone 3/11 - 3/11 Meropenem 3/11>>3/21 for 10 days    Subjective: Patient seen examined bedside, lying in bed.  Alert, pleasantly confused.  No family present this morning. Denies pain, no shortness of breath, no chest pain.  No acute concerns overnight per nursing staff.  Remains on IV antibiotics with meropenem for ESBL infection, day #8 of 10.  Objective: Vitals:   12/06/23 0549 12/06/23 0900 12/06/23 0940 12/06/23 1131  BP: (!) 147/81 (!) 144/76 (!) 144/76 (!) 145/78  Pulse: 79 78 78 84  Resp: 16 18  18   Temp: 98.4 F (36.9 C) 98.7 F (37.1 C)  98.2 F (36.8 C)  TempSrc:    Oral  SpO2: 96% 96%  99%  Weight:      Height:        Intake/Output Summary (Last 24 hours) at 12/06/2023 1431 Last data filed at 12/06/2023 0900 Gross per 24 hour  Intake 580 ml  Output 850 ml  Net -270 ml   [employer]   11/29/23  1216  Weight: 72.3 kg    Examination:  Physical Exam: GEN: NAD, weak/lethargic, chronically ill appearance, appears older than stated age HEENT: NCAT, PERRL, EOMI, sclera clear, dry mucous membranes PULM: CTAB w/o wheezes/crackles, normal respiratory effort, on room air CV: RRR w/o M/G/R GI: abd soft, NTND, + BS MSK: no peripheral edema Integumentary: No rashes/lesions/wounds nonexposed skin surfaces    Data Reviewed: I have personally reviewed following labs and imaging studies  CBC: Recent Labs  Lab 11/30/23 0431  12/01/23 0339 12/02/23 0504 12/06/23 0450  WBC 10.7* 9.6 10.2 7.4  HGB 10.5* 9.9* 11.0* 10.2*  HCT 31.3* 30.1* 33.5* 30.7*  MCV 80.1 80.1 80.7 79.9*  PLT 294 333 386 492*   Basic Metabolic Panel: Recent Labs  Lab 11/30/23 0431 12/01/23 0339 12/02/23 0504 12/06/23 0450  NA 142 140 139 140  K 3.8 3.6 3.8 4.0  CL 111 109 103 103  CO2 25 24 27 30   GLUCOSE 119* 88 91 81  BUN 29* 16 9 11   CREATININE 0.61 0.53 0.47 0.44  CALCIUM 8.3* 8.2* 8.9 8.8*  MG 2.1  --  2.1  --   PHOS 2.0*  --   --   --    GFR: Estimated Creatinine Clearance: 60.1 mL/min (by C-G formula based on SCr of 0.44 mg/dL). Liver Function Tests: No results for input(s): "AST", "ALT", "ALKPHOS", "BILITOT", "PROT", "ALBUMIN" in the last 168 hours.  No results for input(s): "LIPASE", "AMYLASE" in the last 168 hours. No results for input(s): "AMMONIA" in the last 168 hours. Coagulation Profile: No results for input(s): "INR", "PROTIME" in the last 168 hours. Cardiac Enzymes: No results for input(s): "CKTOTAL", "CKMB", "CKMBINDEX", "TROPONINI" in the last 168 hours. BNP (last 3 results) No results for input(s): "PROBNP" in the last 8760 hours. HbA1C: No results for input(s): "HGBA1C" in the last 72 hours.  CBG: Recent Labs  Lab 12/05/23 1150 12/05/23 1653 12/05/23 2016 12/06/23 0756 12/06/23 1132  GLUCAP 111* 139* 104* 96 128*   Lipid Profile: No results for input(s): "CHOL", "HDL", "LDLCALC", "TRIG", "CHOLHDL", "LDLDIRECT" in the last 72 hours. Thyroid Function Tests: No results for input(s): "TSH", "T4TOTAL", "FREET4", "T3FREE", "THYROIDAB" in the last 72 hours.  Anemia Panel: No results for input(s): "VITAMINB12", "FOLATE", "FERRITIN", "TIBC", "IRON", "RETICCTPCT" in the last 72 hours.  Sepsis Labs: No results for input(s): "PROCALCITON", "LATICACIDVEN" in the last 168 hours.   Recent Results (from the past 240 hours)  Resp panel by RT-PCR (RSV, Flu A&B, Covid)     Status: None   Collection  Time: 11/28/23  9:20 AM   Specimen: Nasal Swab  Result Value Ref Range Status   SARS Coronavirus 2 by RT PCR NEGATIVE NEGATIVE Final    Comment: (NOTE) SARS-CoV-2 target nucleic acids are NOT DETECTED.  The SARS-CoV-2 RNA is generally detectable in upper respiratory specimens during the acute phase of infection. The lowest concentration of SARS-CoV-2 viral copies this assay can detect is 138 copies/mL. A negative result does not preclude SARS-Cov-2 infection and should not be used as the sole basis for treatment or other patient management decisions. A negative result may occur with  improper specimen collection/handling, submission of specimen other than nasopharyngeal swab, presence of viral mutation(s) within the areas targeted by this assay, and inadequate number of viral copies(<138 copies/mL). A negative result must be combined with clinical observations, patient history, and epidemiological information. The expected result is Negative.  Fact Sheet for Patients:  BloggerCourse.com  Fact Sheet for Healthcare Providers:  SeriousBroker.it  This test is no t yet approved or cleared by the [place] and  has been authorized for detection and/or diagnosis of SARS-CoV-2 by FDA under an Emergency Use Authorization (EUA). This EUA will remain  in effect (meaning this test can be used) for the duration of the COVID-19 declaration under Section 564(b)(1) of the Act, 21 U.S.C.section 360bbb-3(b)(1), unless the authorization is terminated  or revoked sooner.       Influenza A by PCR NEGATIVE NEGATIVE Final   Influenza B by PCR NEGATIVE NEGATIVE Final    Comment: (NOTE) The Xpert Xpress SARS-CoV-2/FLU/RSV plus assay is intended as an aid in the diagnosis of influenza from Nasopharyngeal swab specimens and should not be used as a sole basis for treatment. Nasal washings and aspirates are unacceptable for Xpert Xpress  SARS-CoV-2/FLU/RSV testing.  Fact Sheet for Patients: BloggerCourse.com  Fact Sheet for Healthcare Providers: SeriousBroker.it  This test is not yet approved or cleared by the [place] FDA and has been authorized for detection and/or diagnosis of SARS-CoV-2 by FDA under an Emergency Use Authorization (EUA). This EUA will remain in effect (meaning this test can be used) for the duration of the COVID-19 declaration under Section 564(b)(1) of the Act, 21 U.S.C. section 360bbb-3(b)(1), unless the authorization is terminated or revoked.     Resp Syncytial Virus by PCR NEGATIVE NEGATIVE Final    Comment: (NOTE) Fact Sheet for Patients: BloggerCourse.com  Fact Sheet for Healthcare Providers: SeriousBroker.it  This test is not yet approved or cleared by the [place] FDA and has been authorized for detection and/or diagnosis of SARS-CoV-2 by FDA under an Emergency Use Authorization (EUA). This EUA will remain in effect (meaning this test can be used) for the duration of the COVID-19 declaration under Section 564(b)(1) of the Act, 21 U.S.C. section 360bbb-3(b)(1), unless the authorization is terminated or revoked.  Performed at [facility], [address] [place], [place] [address]   Urine Culture (for pregnant, neutropenic or urologic patients or patients with an indwelling urinary catheter)     Status: Abnormal   Collection Time: 11/28/23  9:44 AM   Specimen: Urine, Clean Catch  Result Value Ref Range Status   Specimen Description   Final    URINE, CLEAN CATCH Performed at [facility], [address] [place], [place] [address]    Special Requests   Final    NONE Performed at [facility], [address], [place], [place] [address]    Culture (A)  Final    90,000 COLONIES/mL PROTEUS MIRABILIS >=100,000  COLONIES/mL ESCHERICHIA COLI Confirmed Extended Spectrum Beta-Lactamase Producer (ESBL).  In bloodstream infections from ESBL organisms, carbapenems are preferred over piperacillin/tazobactam. They are shown to have a lower risk of mortality.    Report Status 12/02/2023 FINAL  Final   Organism ID, Bacteria PROTEUS MIRABILIS (A)  Final   Organism ID, Bacteria ESCHERICHIA COLI (A)  Final      Susceptibility   Escherichia coli - MIC*    AMPICILLIN >=32 RESISTANT Resistant     CEFAZOLIN >=64 RESISTANT Resistant     CEFEPIME 16 RESISTANT Resistant     CEFTRIAXONE >=64 RESISTANT Resistant     CIPROFLOXACIN >=4 RESISTANT Resistant     GENTAMICIN >=16 RESISTANT Resistant     IMIPENEM <=0.25 SENSITIVE Sensitive     NITROFURANTOIN <=16 SENSITIVE Sensitive     TRIMETH/SULFA >=320 RESISTANT Resistant     AMPICILLIN/SULBACTAM 4 SENSITIVE Sensitive     PIP/TAZO <=4  SENSITIVE Sensitive ug/mL    * >=100,000 COLONIES/mL ESCHERICHIA COLI   Proteus mirabilis - MIC*    AMPICILLIN <=2 SENSITIVE Sensitive     CEFAZOLIN <=4 SENSITIVE Sensitive     CEFEPIME <=0.12 SENSITIVE Sensitive     CEFTRIAXONE <=0.25 SENSITIVE Sensitive     CIPROFLOXACIN <=0.25 SENSITIVE Sensitive     GENTAMICIN <=1 SENSITIVE Sensitive     IMIPENEM 2 SENSITIVE Sensitive     NITROFURANTOIN 128 RESISTANT Resistant     TRIMETH/SULFA <=20 SENSITIVE Sensitive     AMPICILLIN/SULBACTAM <=2 SENSITIVE Sensitive     PIP/TAZO <=4 SENSITIVE Sensitive ug/mL    * 90,000 COLONIES/mL PROTEUS MIRABILIS  Culture, blood (routine x 2)     Status: None   Collection Time: 11/28/23 10:25 AM   Specimen: BLOOD RIGHT FOREARM  Result Value Ref Range Status   Specimen Description   Final    BLOOD RIGHT FOREARM Performed at [facility], [address] [place], [place] [address]    Special Requests   Final    BOTTLES DRAWN AEROBIC AND ANAEROBIC Blood Culture adequate volume Performed at [facility], [address]  [place], [place] [address]    Culture   Final    NO GROWTH 5 DAYS Performed at [facility], [address] [place], [place] [address]    Report Status 12/03/2023 FINAL  Final  Culture, blood (routine x 2)     Status: Abnormal   Collection Time: 11/28/23 10:25 AM   Specimen: BLOOD  Result Value Ref Range Status   Specimen Description   Final    BLOOD LEFT ANTECUBITAL Performed at [facility], [address], [place], [place] [address]    Special Requests   Final    BOTTLES DRAWN AEROBIC AND ANAEROBIC Blood Culture adequate volume Performed at [facility], [address], [place], [place] [address]    Culture  Setup Time   Final    GRAM NEGATIVE RODS AEROBIC BOTTLE ONLY CRITICAL RESULT CALLED TO, READ BACK BY AND VERIFIED WITH: PHARMD T.GREEN AT 1000 ON 11/30/2023 BY T.SAAD. Performed at [facility], [address] [place], [place] [address]    Culture (A)  Final    ESCHERICHIA COLI Confirmed Extended Spectrum Beta-Lactamase Producer (ESBL).  In bloodstream infections from ESBL organisms, carbapenems are preferred over piperacillin/tazobactam. They are shown to have a lower risk of mortality.    Report Status 12/02/2023 FINAL  Final   Organism ID, Bacteria ESCHERICHIA COLI  Final      Susceptibility   Escherichia coli - MIC*    AMPICILLIN >=32 RESISTANT Resistant     CEFEPIME 16 RESISTANT Resistant     CEFTAZIDIME RESISTANT Resistant     CEFTRIAXONE >=64 RESISTANT Resistant     CIPROFLOXACIN >=4 RESISTANT Resistant     GENTAMICIN <=1 SENSITIVE Sensitive     IMIPENEM <=0.25 SENSITIVE Sensitive     TRIMETH/SULFA >=320 RESISTANT Resistant     AMPICILLIN/SULBACTAM 4 SENSITIVE Sensitive     PIP/TAZO <=4 SENSITIVE Sensitive ug/mL    * ESCHERICHIA COLI  Blood Culture ID Panel (Reflexed)     Status: Abnormal   Collection Time: 11/28/23 10:25 AM  Result Value Ref Range Status   Enterococcus faecalis NOT DETECTED NOT DETECTED Final    Enterococcus Faecium NOT DETECTED NOT DETECTED Final   Listeria monocytogenes NOT DETECTED NOT DETECTED Final   Staphylococcus species NOT DETECTED NOT DETECTED Final   Staphylococcus aureus (BCID)  NOT DETECTED NOT DETECTED Final   Staphylococcus epidermidis NOT DETECTED NOT DETECTED Final   Staphylococcus lugdunensis NOT DETECTED NOT DETECTED Final   Streptococcus species NOT DETECTED NOT DETECTED Final   Streptococcus agalactiae NOT DETECTED NOT DETECTED Final   Streptococcus pneumoniae NOT DETECTED NOT DETECTED Final   Streptococcus pyogenes NOT DETECTED NOT DETECTED Final   A.calcoaceticus-baumannii NOT DETECTED NOT DETECTED Final   Bacteroides fragilis NOT DETECTED NOT DETECTED Final   Enterobacterales DETECTED (A) NOT DETECTED Final    Comment: Enterobacterales represent a large order of gram negative bacteria, not a single organism. CRITICAL RESULT CALLED TO, READ BACK BY AND VERIFIED WITH: PHARMD T.GREEN AT 1000 ON 11/30/2023 BY T.SAAD.    Enterobacter cloacae complex NOT DETECTED NOT DETECTED Final   Escherichia coli DETECTED (A) NOT DETECTED Final    Comment: CRITICAL RESULT CALLED TO, READ BACK BY AND VERIFIED WITH: PHARMD T.GREEN AT 1000 ON 11/30/2023 BY T.SAAD.    Klebsiella aerogenes NOT DETECTED NOT DETECTED Final   Klebsiella oxytoca NOT DETECTED NOT DETECTED Final   Klebsiella pneumoniae NOT DETECTED NOT DETECTED Final   Proteus species NOT DETECTED NOT DETECTED Final   Salmonella species NOT DETECTED NOT DETECTED Final   Serratia marcescens NOT DETECTED NOT DETECTED Final   Haemophilus influenzae NOT DETECTED NOT DETECTED Final   Neisseria meningitidis NOT DETECTED NOT DETECTED Final   Pseudomonas aeruginosa NOT DETECTED NOT DETECTED Final   Stenotrophomonas maltophilia NOT DETECTED NOT DETECTED Final   Candida albicans NOT DETECTED NOT DETECTED Final   Candida auris NOT DETECTED NOT DETECTED Final   Candida glabrata NOT DETECTED NOT DETECTED Final   Candida  krusei NOT DETECTED NOT DETECTED Final   Candida parapsilosis NOT DETECTED NOT DETECTED Final   Candida tropicalis NOT DETECTED NOT DETECTED Final   Cryptococcus neoformans/gattii NOT DETECTED NOT DETECTED Final   CTX-M ESBL DETECTED (A) NOT DETECTED Final    Comment: CRITICAL RESULT CALLED TO, READ BACK BY AND VERIFIED WITH: PHARMD T.GREEN AT 1000 ON 11/30/2023 BY T.SAAD. (NOTE) Extended spectrum beta-lactamase detected. Recommend a carbapenem as initial therapy.      Carbapenem resistance IMP NOT DETECTED NOT DETECTED Final   Carbapenem resistance KPC NOT DETECTED NOT DETECTED Final   Carbapenem resistance NDM NOT DETECTED NOT DETECTED Final   Carbapenem resist OXA 48 LIKE NOT DETECTED NOT DETECTED Final   Carbapenem resistance VIM NOT DETECTED NOT DETECTED Final    Comment: Performed at [facility], [address], [place], [place] [address]         Radiology Studies: No results found.       Scheduled Meds:  ascorbic acid  500 mg Oral Daily   Carbidopa-Levodopa ER  1 tablet Oral Q2000   Carbidopa-Levodopa ER  2 tablet Oral TID PC   DULoxetine  20 mg Oral q morning   feeding supplement  237 mL Oral BID BM   iron polysaccharides  150 mg Oral Daily   levothyroxine  100 mcg Oral Q0600   metoprolol tartrate  12.5 mg Oral BID   rosuvastatin  10 mg Oral QPM   senna-docusate  1 tablet Oral QHS   Continuous Infusions:  meropenem (MERREM) IV 1 g (12/06/23 0520)     LOS: 8 days    Time spent: 48 minutes spent on 12/06/2023 caring for this patient face-to-face including chart review, ordering labs/tests, documenting, discussion with nursing staff, consultants, updating family and interview/physical exam    Gillis Santa, MD [facility] Available via Epic  secure chat 7am-7pm After these hours, please refer to coverage provider listed on [email or web address] 12/06/2023, 2:31 PM

## 2023-12-06 NOTE — Plan of Care (Signed)

## 2023-12-07 DIAGNOSIS — N390 Urinary tract infection, site not specified: Secondary | ICD-10-CM | POA: Diagnosis not present

## 2023-12-07 LAB — GLUCOSE, CAPILLARY
Glucose-Capillary: 83 mg/dL (ref 70–99)
Glucose-Capillary: 122 mg/dL — ABNORMAL HIGH (ref 70–99)
Glucose-Capillary: 88 mg/dL (ref 70–99)
Glucose-Capillary: 106 mg/dL — ABNORMAL HIGH (ref 70–99)

## 2023-12-07 LAB — CBC
HCT: 30.2 % — ABNORMAL LOW (ref 36.0–46.0)
Hemoglobin: 10.1 g/dL — ABNORMAL LOW (ref 12.0–15.0)
MCH: 26.7 pg (ref 26.0–34.0)
MCHC: 33.4 g/dL (ref 30.0–36.0)
MCV: 79.9 fL — ABNORMAL LOW (ref 80.0–100.0)
Platelets: 497 10*3/uL — ABNORMAL HIGH (ref 150–400)
RBC: 3.78 MIL/uL — ABNORMAL LOW (ref 3.87–5.11)
RDW: 13.8 % (ref 11.5–15.5)
WBC: 6.6 10*3/uL (ref 4.0–10.5)
nRBC: 0 % (ref 0.0–0.2)

## 2023-12-07 LAB — BASIC METABOLIC PANEL
Anion gap: 9 (ref 5–15)
BUN: 14 mg/dL (ref 8–23)
CO2: 27 mmol/L (ref 22–32)
Calcium: 8.6 mg/dL — ABNORMAL LOW (ref 8.9–10.3)
Chloride: 102 mmol/L (ref 98–111)
Creatinine, Ser: 0.54 mg/dL (ref 0.44–1.00)
GFR, Estimated: >60 mL/min (ref >60)
Glucose, Bld: 73 mg/dL (ref 70–99)
Potassium: 4.1 mmol/L (ref 3.5–5.1)
Sodium: 138 mmol/L (ref 135–145)

## 2023-12-07 LAB — PHOSPHORUS: Phosphorus: 3.2 mg/dL (ref 2.5–4.6)

## 2023-12-07 LAB — MAGNESIUM: Magnesium: 2.3 mg/dL (ref 1.7–2.4)

## 2023-12-07 MED ORDER — HYDROCOD POLI-CHLORPHE POLI ER 10-8 MG/5ML PO SUER: 5.0000 mL | Freq: Two times a day (BID) | ORAL | Status: DC | PRN

## 2023-12-07 MED ORDER — GUAIFENESIN-DM 100-10 MG/5ML PO SYRP: 10.0000 mL | ORAL_SOLUTION | Freq: Four times a day (QID) | ORAL | Status: DC | PRN

## 2023-12-07 NOTE — Plan of Care (Signed)
  Problem: Education: Goal: Ability to describe self-care measures that may prevent or decrease complications (Diabetes Survival Skills Education) will improve Outcome: Progressing Goal: Individualized Educational Video(s) Outcome: Progressing   Problem: Coping: Goal: Ability to adjust to condition or change in health will improve Outcome: Progressing   Problem: Health Behavior/Discharge Planning: Goal: Ability to identify and utilize available resources and services will improve Outcome: Progressing Goal: Ability to manage health-related needs will improve Outcome: Progressing   Problem: Metabolic: Goal: Ability to maintain appropriate glucose levels will improve Outcome: Progressing

## 2023-12-07 NOTE — Progress Notes (Signed)
 Physical Therapy Treatment Patient Details Name: Anna Bernard MRN: [id number] DOB: 1951/02/27 Today's Date: 12/07/2023   History of Present Illness Anna Bernard is a 73 y.o. female admitted 11/28/23 from home due to AMS, weakness, somnolence, decreased oral intake and inability to help with transfers. Dx with Acute metabolic encephalopathy and UTI. PMH includes anemia, CAD, hyperlipidemia, HTN, hypothyroidism, paroxysmal atrial fibrillation, prediabetes, PE, vascular parkinson disease and dementia, frequent syncope.    PT Comments  Spouse present then stepped out.  Pt Cognition Comments: AxO x 2 pleasant.  Following all instructions with increased time. Pt in AIR mattress bed.  Per chart, review Pt is bedbound and uses a Nurse, adult at home for transfer.  Performed B LE TE mostly AAROM 10 reps AP 10 reps knee presses 10 reps towel squeezes 10 reps HS 10 reps hip ABd/ADd Repositioned to comfort.     If plan is discharge home, recommend the following:     Can travel by private vehicle        Equipment Recommendations       Recommendations for Other Services       Precautions / Restrictions Precautions Precautions: Fall Precaution/Restrictions Comments: parkinsons, dementia, frequent syncope Restrictions Weight Bearing Restrictions Per Provider Order: No           Communication    Cognition Arousal: Alert     PT - Cognitive impairments: History of cognitive impairments                       PT - Cognition Comments: AxO x 2 pleasant.  Following all instructions with increased time.        Cueing Cueing Techniques: Verbal cues  Exercises      General Comments        Pertinent Vitals/Pain Pain Assessment Pain Assessment: No/denies pain    Home Living                          Prior Function            PT Goals (current goals can now be found in the care plan section) Progress towards PT goals: Progressing toward goals     Frequency           PT Plan      Co-evaluation              AM-PAC PT "6 Clicks" Mobility   Outcome Measure                   End of Session               Time: 2951-8841 PT Time Calculation (min) (ACUTE ONLY): 13 min  Charges:    $Therapeutic Exercise: 8-22 mins PT General Charges $$ ACUTE PT VISIT: 1 Visit                     Felecia Shelling  PTA Acute  Rehabilitation Services Office M-F          [phone]

## 2023-12-07 NOTE — Progress Notes (Signed)
 PROGRESS NOTE    Anna Bernard  VOZ:[id number] DOB: [birth year] DOA: 11/28/2023 PCP: Billey Co, MD    Brief Narrative:   Anna Bernard is a 73 y.o. female with past medical history significant for HTN, HLD, CAD, hypothyroidism, paroxysmal atrial fibrillation no longer on anticoagulation, history of PE, hypothyroid, anemia, Parkinson's disease, vitamin B12 deficiency, right subclavian artery stenosis, who presented to [facility] ED via EMS from home for progressive weakness/lethargy, confusion.  Family reports decreased urine output and oral intake.  Patient unable to provide further information and only follows simple commands.  In the ED, temperature 100.5 F, HR 86, RR 23, BP 103/62, SpO2 95% on room air.  WBC 12.0, hemoglobin 9.2, platelet count 243.  Sodium 143, potassium 3.6, chloride 115, CO2 18, glucose 96, BUN 39, creatinine 0.71.  AST 23, ALT 7, total bilirubin 0.9.  High sensitive troponin 12.  Lactic acid 0.7> 0.8.  Urinalysis with small leukocytes, negative nitrite, greater than 300 protein, many bacteria, greater than 50 RBCs, greater than 50 WBCs.  Chest x-ray with no active cardiopulmonary disease process.  Blood cultures x 2 and urine culture obtained. The patient received 2000 mL of normal saline bolus, ceftriaxone 2 g IVPB, calcium gluconate 1 g IVPB and acetaminophen 650 mg suppository PR x 1.  Given history of ESBL, patient was started on meropenem.  TRH consulted for admission for further evaluation management of acute metabolic encephalopathy likely secondary to urinary tract infection.  Assessment & Plan:   Acute metabolic encephalopathy ESBL E. coli bacteremia E. coli/Proteus mirabilis UTI Hemorrhagic cystitis Patient presenting to ED with progressive weakness, lethargy, confusion.  Also with decreased oral intake, urine output.  History of previous ESBL E. coli urinary tract infections per review of MAR.  Patient with temperature 100.5 F on mission.  WBC  count elevated 20.0 with normal lactic acid.  Urinalysis consistent with UTI.  CT head without contrast with no acute intracranial abnormality. -- WBC 12.0>13.9>18.1>9.6>7.4 -- Blood cultures x 2: + GNR, BCID + ESBL Ecoli -- Urine culture: + Ecoli/Proteus Mirabilis -- Meropenem 1 g IV every 8 hours; plan 10 day course (Day #9)  Dysphagia -- Seen by speech therapy -- Dysphagia 3 diet -- Aspiration precautions; assist with feeding  Essential hypertension Paroxysmal atrial fibrillation no longer on anticoagulation -- Metoprolol tartrate 12.5 mg p.o. twice daily  Hyperlipidemia CAD -- Crestor 10 mg p.o. daily  Hypothyroidism -- Levothyroxine 100 mcg p.o. daily  History of anemia History of small bowel AVM -- Hgb 9.2>11.0>10.5>9.4>10.2; stable -- Transfuse for hemoglobin less than 7.0  GERD -- Protonix 40 mg p.o. daily as needed heartburn  History of syncope  Seen by cardiology previously, thought to be secondary to orthostasis; probably as well as autonomic dysfunction with history of Parkinson's disease. -- Supportive care, fall precautions  Parkinson disease with dementia Follows with neurology outpatient, Dr. Arbutus Leas.  Last seen in clinic on 08/30/2023.  Per neurology note, no ambulates much and memory has been very poor. -- Sinemet 4 times daily -- Melatonin 3 mg p.o. nightly -- Delirium precautions -- Get up during the day -- Encourage a familiar face to remain present throughout the day -- Keep blinds open and lights on during daylight hours -- Minimize the use of opioids/benzodiazepines  Weakness/debility/deconditioning: -- PT/OT recommend home health PT/OT -- Continue therapy efforts while inpatient Low calcium could be due to low albumin versus poor nutrition.  Vitamin D within normal range   Pressure injury, right buttock,  stage I sacrum, ischial tuberosity, POA Pressure Injury 11/29/23 Buttocks Right Deep Tissue Pressure Injury - Purple or maroon localized area  of discolored intact skin or blood-filled blister due to damage of underlying soft tissue from pressure and/or shear. unblanchable area, dark in color w (Active)  11/29/23 0700  Location: Buttocks  Location Orientation: Right  Staging: Deep Tissue Pressure Injury - Purple or maroon localized area of discolored intact skin or blood-filled blister due to damage of underlying soft tissue from pressure and/or shear.  Wound Description (Comments): unblanchable area, dark in color with blistered skin  Present on Admission: Yes     Pressure Injury 11/29/23 Sacrum Mid Stage 1 -  Intact skin with non-blanchable redness of a localized area usually over a bony prominence. (Active)  11/29/23 0700  Location: Sacrum  Location Orientation: Mid  Staging: Stage 1 -  Intact skin with non-blanchable redness of a localized area usually over a bony prominence.  Wound Description (Comments):   Present on Admission: Yes     Pressure Injury 11/29/23 Ischial tuberosity Right Deep Tissue Pressure Injury - Purple or maroon localized area of discolored intact skin or blood-filled blister due to damage of underlying soft tissue from pressure and/or shear. (Active)  11/29/23 0700  Location: Ischial tuberosity  Location Orientation: Right  Staging: Deep Tissue Pressure Injury - Purple or maroon localized area of discolored intact skin or blood-filled blister due to damage of underlying soft tissue from pressure and/or shear.  Wound Description (Comments):   Present on Admission: Yes     Pressure Injury Ischial tuberosity Left Deep Tissue Pressure Injury - Purple or maroon localized area of discolored intact skin or blood-filled blister due to damage of underlying soft tissue from pressure and/or shear. (Active)     Location: Ischial tuberosity  Location Orientation: Left  Staging: Deep Tissue Pressure Injury - Purple or maroon localized area of discolored intact skin or blood-filled blister due to damage of underlying  soft tissue from pressure and/or shear.  Wound Description (Comments):   Present on Admission: Yes  Seen by wound RN, continue local wound care, offloading, air mattress     DVT prophylaxis: SCDs Start: 11/28/23 1400    Code Status: Full Code Family Communication:  Patient's husband was present at bedside this morning  Disposition Plan:  Level of care: Med-Surg Status is: Inpatient Remains inpatient appropriate because: IV antibiotics, pending PT/OT evaluation, further workup    Consultants:  None  Procedures:  None  Antimicrobials:  Ceftriaxone 3/11 - 3/11 Meropenem 3/11>>3/21 for 10 days    Subjective: Patient seen examined bedside, lying in bed.  Alert, pleasantly confused.  Her husband was at bedside. Pt Denies pain, no shortness of breath, no chest pain.  No acute concerns overnight per nursing staff.  Remains on IV antibiotics with meropenem for ESBL infection, day #9 of 10.  Objective: Vitals:   12/07/23 0636 12/07/23 0900 12/07/23 0959 12/07/23 1208  BP: 115/65 122/64 122/64 110/71  Pulse: 77 74 74 77  Resp: 16 18  18   Temp: 98.4 F (36.9 C) 98.9 F (37.2 C)  98.2 F (36.8 C)  TempSrc:  Axillary  Oral  SpO2: 97%   97%  Weight:      Height:        Intake/Output Summary (Last 24 hours) at 12/07/2023 1446 Last data filed at 12/07/2023 1300 Gross per 24 hour  Intake 1080 ml  Output 1075 ml  Net 5 ml   Filed Weights   11/29/23 1216  Weight: 72.3 kg    Examination:  Physical Exam: GEN: NAD, weak/lethargic, chronically ill appearance, appears older than stated age HEENT: NCAT, PERRL, EOMI, sclera clear, dry mucous membranes PULM: CTAB w/o wheezes/crackles, normal respiratory effort, on room air CV: RRR w/o M/G/R GI: abd soft, NTND, + BS MSK: no peripheral edema Integumentary: No rashes/lesions/wounds nonexposed skin surfaces    Data Reviewed: I have personally reviewed following labs and imaging studies  CBC: Recent Labs  Lab  12/01/23 0339 12/02/23 0504 12/06/23 0450 12/07/23 0439  WBC 9.6 10.2 7.4 6.6  HGB 9.9* 11.0* 10.2* 10.1*  HCT 30.1* 33.5* 30.7* 30.2*  MCV 80.1 80.7 79.9* 79.9*  PLT 333 386 492* 497*   Basic Metabolic Panel: Recent Labs  Lab 12/01/23 0339 12/02/23 0504 12/06/23 0450 12/07/23 0439  NA 140 139 140 138  K 3.6 3.8 4.0 4.1  CL 109 103 103 102  CO2 24 27 30 27   GLUCOSE 88 91 81 73  BUN 16 9 11 14   CREATININE 0.53 0.47 0.44 0.54  CALCIUM 8.2* 8.9 8.8* 8.6*  MG  --  2.1  --  2.3  PHOS  --   --   --  3.2   GFR: Estimated Creatinine Clearance: 60.1 mL/min (by C-G formula based on SCr of 0.54 mg/dL). Liver Function Tests: No results for input(s): "AST", "ALT", "ALKPHOS", "BILITOT", "PROT", "ALBUMIN" in the last 168 hours.  No results for input(s): "LIPASE", "AMYLASE" in the last 168 hours. No results for input(s): "AMMONIA" in the last 168 hours. Coagulation Profile: No results for input(s): "INR", "PROTIME" in the last 168 hours. Cardiac Enzymes: No results for input(s): "CKTOTAL", "CKMB", "CKMBINDEX", "TROPONINI" in the last 168 hours. BNP (last 3 results) No results for input(s): "PROBNP" in the last 8760 hours. HbA1C: No results for input(s): "HGBA1C" in the last 72 hours.  CBG: Recent Labs  Lab 12/06/23 1132 12/06/23 1644 12/06/23 2112 12/07/23 0737 12/07/23 1229  GLUCAP 128* 105* 153* 83 122*   Lipid Profile: No results for input(s): "CHOL", "HDL", "LDLCALC", "TRIG", "CHOLHDL", "LDLDIRECT" in the last 72 hours. Thyroid Function Tests: No results for input(s): "TSH", "T4TOTAL", "FREET4", "T3FREE", "THYROIDAB" in the last 72 hours.  Anemia Panel: No results for input(s): "VITAMINB12", "FOLATE", "FERRITIN", "TIBC", "IRON", "RETICCTPCT" in the last 72 hours.  Sepsis Labs: No results for input(s): "PROCALCITON", "LATICACIDVEN" in the last 168 hours.   Recent Results (from the past 240 hours)  Resp panel by RT-PCR (RSV, Flu A&B, Covid)     Status: None    Collection Time: 11/28/23  9:20 AM   Specimen: Nasal Swab  Result Value Ref Range Status   SARS Coronavirus 2 by RT PCR NEGATIVE NEGATIVE Final    Comment: (NOTE) SARS-CoV-2 target nucleic acids are NOT DETECTED.  The SARS-CoV-2 RNA is generally detectable in upper respiratory specimens during the acute phase of infection. The lowest concentration of SARS-CoV-2 viral copies this assay can detect is 138 copies/mL. A negative result does not preclude SARS-Cov-2 infection and should not be used as the sole basis for treatment or other patient management decisions. A negative result may occur with  improper specimen collection/handling, submission of specimen other than nasopharyngeal swab, presence of viral mutation(s) within the areas targeted by this assay, and inadequate number of viral copies(<138 copies/mL). A negative result must be combined with clinical observations, patient history, and epidemiological information. The expected result is Negative.  Fact Sheet for Patients:  BloggerCourse.com  Fact Sheet for Healthcare Providers:  SeriousBroker.it  This  test is no t yet approved or cleared by the [place] and  has been authorized for detection and/or diagnosis of SARS-CoV-2 by FDA under an Emergency Use Authorization (EUA). This EUA will remain  in effect (meaning this test can be used) for the duration of the COVID-19 declaration under Section 564(b)(1) of the Act, 21 U.S.C.section 360bbb-3(b)(1), unless the authorization is terminated  or revoked sooner.       Influenza A by PCR NEGATIVE NEGATIVE Final   Influenza B by PCR NEGATIVE NEGATIVE Final    Comment: (NOTE) The Xpert Xpress SARS-CoV-2/FLU/RSV plus assay is intended as an aid in the diagnosis of influenza from Nasopharyngeal swab specimens and should not be used as a sole basis for treatment. Nasal washings and aspirates are unacceptable for Xpert Xpress  SARS-CoV-2/FLU/RSV testing.  Fact Sheet for Patients: BloggerCourse.com  Fact Sheet for Healthcare Providers: SeriousBroker.it  This test is not yet approved or cleared by the [place] FDA and has been authorized for detection and/or diagnosis of SARS-CoV-2 by FDA under an Emergency Use Authorization (EUA). This EUA will remain in effect (meaning this test can be used) for the duration of the COVID-19 declaration under Section 564(b)(1) of the Act, 21 U.S.C. section 360bbb-3(b)(1), unless the authorization is terminated or revoked.     Resp Syncytial Virus by PCR NEGATIVE NEGATIVE Final    Comment: (NOTE) Fact Sheet for Patients: BloggerCourse.com  Fact Sheet for Healthcare Providers: SeriousBroker.it  This test is not yet approved or cleared by the [place] FDA and has been authorized for detection and/or diagnosis of SARS-CoV-2 by FDA under an Emergency Use Authorization (EUA). This EUA will remain in effect (meaning this test can be used) for the duration of the COVID-19 declaration under Section 564(b)(1) of the Act, 21 U.S.C. section 360bbb-3(b)(1), unless the authorization is terminated or revoked.  Performed at [facility], [address], [place], [place] [address]   Urine Culture (for pregnant, neutropenic or urologic patients or patients with an indwelling urinary catheter)     Status: Abnormal   Collection Time: 11/28/23  9:44 AM   Specimen: Urine, Clean Catch  Result Value Ref Range Status   Specimen Description   Final    URINE, CLEAN CATCH Performed at [facility], [address], [place], [place] [address]    Special Requests   Final    NONE Performed at [facility], [address] [place], [place] [address]    Culture (A)  Final    90,000 COLONIES/mL PROTEUS MIRABILIS >=100,000  COLONIES/mL ESCHERICHIA COLI Confirmed Extended Spectrum Beta-Lactamase Producer (ESBL).  In bloodstream infections from ESBL organisms, carbapenems are preferred over piperacillin/tazobactam. They are shown to have a lower risk of mortality.    Report Status 12/02/2023 FINAL  Final   Organism ID, Bacteria PROTEUS MIRABILIS (A)  Final   Organism ID, Bacteria ESCHERICHIA COLI (A)  Final      Susceptibility   Escherichia coli - MIC*    AMPICILLIN >=32 RESISTANT Resistant     CEFAZOLIN >=64 RESISTANT Resistant     CEFEPIME 16 RESISTANT Resistant     CEFTRIAXONE >=64 RESISTANT Resistant     CIPROFLOXACIN >=4 RESISTANT Resistant     GENTAMICIN >=16 RESISTANT Resistant     IMIPENEM <=0.25 SENSITIVE Sensitive     NITROFURANTOIN <=16 SENSITIVE Sensitive     TRIMETH/SULFA >=320 RESISTANT Resistant     AMPICILLIN/SULBACTAM 4 SENSITIVE Sensitive     PIP/TAZO <=4 SENSITIVE  Sensitive ug/mL    * >=100,000 COLONIES/mL ESCHERICHIA COLI   Proteus mirabilis - MIC*    AMPICILLIN <=2 SENSITIVE Sensitive     CEFAZOLIN <=4 SENSITIVE Sensitive     CEFEPIME <=0.12 SENSITIVE Sensitive     CEFTRIAXONE <=0.25 SENSITIVE Sensitive     CIPROFLOXACIN <=0.25 SENSITIVE Sensitive     GENTAMICIN <=1 SENSITIVE Sensitive     IMIPENEM 2 SENSITIVE Sensitive     NITROFURANTOIN 128 RESISTANT Resistant     TRIMETH/SULFA <=20 SENSITIVE Sensitive     AMPICILLIN/SULBACTAM <=2 SENSITIVE Sensitive     PIP/TAZO <=4 SENSITIVE Sensitive ug/mL    * 90,000 COLONIES/mL PROTEUS MIRABILIS  Culture, blood (routine x 2)     Status: None   Collection Time: 11/28/23 10:25 AM   Specimen: BLOOD RIGHT FOREARM  Result Value Ref Range Status   Specimen Description   Final    BLOOD RIGHT FOREARM Performed at [facility], [address], [place], [place] [address]    Special Requests   Final    BOTTLES DRAWN AEROBIC AND ANAEROBIC Blood Culture adequate volume Performed at [facility], [address],  [place], [place] [address]    Culture   Final    NO GROWTH 5 DAYS Performed at [facility], [address], [place], [place] [address]    Report Status 12/03/2023 FINAL  Final  Culture, blood (routine x 2)     Status: Abnormal   Collection Time: 11/28/23 10:25 AM   Specimen: BLOOD  Result Value Ref Range Status   Specimen Description   Final    BLOOD LEFT ANTECUBITAL Performed at [facility], [address], [place], [place] [address]    Special Requests   Final    BOTTLES DRAWN AEROBIC AND ANAEROBIC Blood Culture adequate volume Performed at [facility], [address], [place], [place] [address]    Culture  Setup Time   Final    GRAM NEGATIVE RODS AEROBIC BOTTLE ONLY CRITICAL RESULT CALLED TO, READ BACK BY AND VERIFIED WITH: PHARMD T.GREEN AT 1000 ON 11/30/2023 BY T.SAAD. Performed at [place] [facility], [address], [place], [place] [address]    Culture (A)  Final    ESCHERICHIA COLI Confirmed Extended Spectrum Beta-Lactamase Producer (ESBL).  In bloodstream infections from ESBL organisms, carbapenems are preferred over piperacillin/tazobactam. They are shown to have a lower risk of mortality.    Report Status 12/02/2023 FINAL  Final   Organism ID, Bacteria ESCHERICHIA COLI  Final      Susceptibility   Escherichia coli - MIC*    AMPICILLIN >=32 RESISTANT Resistant     CEFEPIME 16 RESISTANT Resistant     CEFTAZIDIME RESISTANT Resistant     CEFTRIAXONE >=64 RESISTANT Resistant     CIPROFLOXACIN >=4 RESISTANT Resistant     GENTAMICIN <=1 SENSITIVE Sensitive     IMIPENEM <=0.25 SENSITIVE Sensitive     TRIMETH/SULFA >=320 RESISTANT Resistant     AMPICILLIN/SULBACTAM 4 SENSITIVE Sensitive     PIP/TAZO <=4 SENSITIVE Sensitive ug/mL    * ESCHERICHIA COLI  Blood Culture ID Panel (Reflexed)     Status: Abnormal   Collection Time: 11/28/23 10:25 AM  Result Value Ref Range Status   Enterococcus faecalis NOT DETECTED NOT DETECTED Final    Enterococcus Faecium NOT DETECTED NOT DETECTED Final   Listeria monocytogenes NOT DETECTED NOT DETECTED Final   Staphylococcus species NOT DETECTED NOT DETECTED Final   Staphylococcus aureus (BCID) NOT  DETECTED NOT DETECTED Final   Staphylococcus epidermidis NOT DETECTED NOT DETECTED Final   Staphylococcus lugdunensis NOT DETECTED NOT DETECTED Final   Streptococcus species NOT DETECTED NOT DETECTED Final   Streptococcus agalactiae NOT DETECTED NOT DETECTED Final   Streptococcus pneumoniae NOT DETECTED NOT DETECTED Final   Streptococcus pyogenes NOT DETECTED NOT DETECTED Final   A.calcoaceticus-baumannii NOT DETECTED NOT DETECTED Final   Bacteroides fragilis NOT DETECTED NOT DETECTED Final   Enterobacterales DETECTED (A) NOT DETECTED Final    Comment: Enterobacterales represent a large order of gram negative bacteria, not a single organism. CRITICAL RESULT CALLED TO, READ BACK BY AND VERIFIED WITH: PHARMD T.GREEN AT 1000 ON 11/30/2023 BY T.SAAD.    Enterobacter cloacae complex NOT DETECTED NOT DETECTED Final   Escherichia coli DETECTED (A) NOT DETECTED Final    Comment: CRITICAL RESULT CALLED TO, READ BACK BY AND VERIFIED WITH: PHARMD T.GREEN AT 1000 ON 11/30/2023 BY T.SAAD.    Klebsiella aerogenes NOT DETECTED NOT DETECTED Final   Klebsiella oxytoca NOT DETECTED NOT DETECTED Final   Klebsiella pneumoniae NOT DETECTED NOT DETECTED Final   Proteus species NOT DETECTED NOT DETECTED Final   Salmonella species NOT DETECTED NOT DETECTED Final   Serratia marcescens NOT DETECTED NOT DETECTED Final   Haemophilus influenzae NOT DETECTED NOT DETECTED Final   Neisseria meningitidis NOT DETECTED NOT DETECTED Final   Pseudomonas aeruginosa NOT DETECTED NOT DETECTED Final   Stenotrophomonas maltophilia NOT DETECTED NOT DETECTED Final   Candida albicans NOT DETECTED NOT DETECTED Final   Candida auris NOT DETECTED NOT DETECTED Final   Candida glabrata NOT DETECTED NOT DETECTED Final   Candida  krusei NOT DETECTED NOT DETECTED Final   Candida parapsilosis NOT DETECTED NOT DETECTED Final   Candida tropicalis NOT DETECTED NOT DETECTED Final   Cryptococcus neoformans/gattii NOT DETECTED NOT DETECTED Final   CTX-M ESBL DETECTED (A) NOT DETECTED Final    Comment: CRITICAL RESULT CALLED TO, READ BACK BY AND VERIFIED WITH: PHARMD T.GREEN AT 1000 ON 11/30/2023 BY T.SAAD. (NOTE) Extended spectrum beta-lactamase detected. Recommend a carbapenem as initial therapy.      Carbapenem resistance IMP NOT DETECTED NOT DETECTED Final   Carbapenem resistance KPC NOT DETECTED NOT DETECTED Final   Carbapenem resistance NDM NOT DETECTED NOT DETECTED Final   Carbapenem resist OXA 48 LIKE NOT DETECTED NOT DETECTED Final   Carbapenem resistance VIM NOT DETECTED NOT DETECTED Final    Comment: Performed at [facility], [address], [place], [place] [address]         Radiology Studies: No results found.       Scheduled Meds:  ascorbic acid  500 mg Oral Daily   Carbidopa-Levodopa ER  1 tablet Oral Q2000   Carbidopa-Levodopa ER  2 tablet Oral TID PC   DULoxetine  20 mg Oral q morning   feeding supplement  237 mL Oral BID BM   iron polysaccharides  150 mg Oral Daily   levothyroxine  100 mcg Oral Q0600   metoprolol tartrate  12.5 mg Oral BID   rosuvastatin  10 mg Oral QPM   senna-docusate  1 tablet Oral QHS   Continuous Infusions:  meropenem (MERREM) IV 1 g (12/07/23 1415)     LOS: 9 days    Time spent: 40 minutes spent on 12/07/2023 caring for this patient face-to-face including chart review, ordering labs/tests, documenting, discussion with nursing staff, consultants, updating family and interview/physical exam    Gillis Santa, MD [facility] Available via Epic secure  chat 7am-7pm After these hours, please refer to coverage provider listed on [email or web address] 12/07/2023, 2:46 PM

## 2023-12-07 NOTE — Plan of Care (Signed)

## 2023-12-08 DIAGNOSIS — N390 Urinary tract infection, site not specified: Secondary | ICD-10-CM | POA: Diagnosis not present

## 2023-12-08 DIAGNOSIS — L8990 Pressure ulcer of unspecified site, unspecified stage: Secondary | ICD-10-CM | POA: Insufficient documentation

## 2023-12-08 LAB — BASIC METABOLIC PANEL
Anion gap: 4 — ABNORMAL LOW (ref 5–15)
BUN: 15 mg/dL (ref 8–23)
CO2: 30 mmol/L (ref 22–32)
Calcium: 8.4 mg/dL — ABNORMAL LOW (ref 8.9–10.3)
Chloride: 103 mmol/L (ref 98–111)
Creatinine, Ser: 0.38 mg/dL — ABNORMAL LOW (ref 0.44–1.00)
GFR, Estimated: >60 mL/min (ref >60)
Glucose, Bld: 73 mg/dL (ref 70–99)
Potassium: 4.1 mmol/L (ref 3.5–5.1)
Sodium: 137 mmol/L (ref 135–145)

## 2023-12-08 LAB — GLUCOSE, CAPILLARY
Glucose-Capillary: 77 mg/dL (ref 70–99)
Glucose-Capillary: 87 mg/dL (ref 70–99)

## 2023-12-08 LAB — CBC
HCT: 30.3 % — ABNORMAL LOW (ref 36.0–46.0)
Hemoglobin: 9.9 g/dL — ABNORMAL LOW (ref 12.0–15.0)
MCH: 26.3 pg (ref 26.0–34.0)
MCHC: 32.7 g/dL (ref 30.0–36.0)
MCV: 80.6 fL (ref 80.0–100.0)
Platelets: 499 10*3/uL — ABNORMAL HIGH (ref 150–400)
RBC: 3.76 MIL/uL — ABNORMAL LOW (ref 3.87–5.11)
RDW: 13.7 % (ref 11.5–15.5)
WBC: 6.9 10*3/uL (ref 4.0–10.5)
nRBC: 0 % (ref 0.0–0.2)

## 2023-12-08 MED ORDER — POLYSACCHARIDE IRON COMPLEX 150 MG PO CAPS: 150.0000 mg | ORAL_CAPSULE | Freq: Every day | ORAL | 0 refills | Status: AC

## 2023-12-08 MED ORDER — ENSURE ENLIVE PO LIQD: 237.0000 mL | Freq: Two times a day (BID) | ORAL | 12 refills | Status: DC

## 2023-12-08 MED ORDER — ASCORBIC ACID 500 MG PO TABS: 500.0000 mg | ORAL_TABLET | Freq: Every day | ORAL | 0 refills | Status: AC

## 2023-12-08 NOTE — Progress Notes (Signed)
 Occupational Therapy Treatment Patient Details Name: OCTA UPLINGER MRN: [id number] DOB: May 15, 1951 Today's Date: 12/08/2023   History of present illness Anna Bernard is a 73 y.o. female admitted 11/28/23 from home due to AMS, weakness, somnolence, decreased oral intake and inability to help with transfers. Dx with Acute metabolic encephalopathy and UTI. PMH includes anemia, CAD, hyperlipidemia, HTN, hypothyroidism, paroxysmal atrial fibrillation, prediabetes, PE, vascular parkinson disease and dementia, frequent syncope.   OT comments  Pt. Seen for skilled OT treatment session.  Focus on cont. HEP for UE strengthening to aide in adls and bed mobility (rolling).  Pt. Able to utilize yellow theraband and without along with squeeze ball for indicated exercises.  Tolerated well.  Intermittent demo and tactile guidance for tech.  Cont. With acute OT POC.        If plan is discharge home, recommend the following:  Two people to help with walking and/or transfers;A lot of help with bathing/dressing/bathroom;Assistance with cooking/housework;Assistance with feeding;Assist for transportation   Equipment Recommendations       Recommendations for Other Services      Precautions / Restrictions Precautions Precautions: Fall Precaution/Restrictions Comments: parkinsons, dementia, frequent syncope       Mobility Bed Mobility                    Transfers                         Balance                                           ADL either performed or assessed with clinical judgement   ADL                                              Extremity/Trunk Assessment              Vision       Perception     Praxis     Communication Communication Communication: Impaired Factors Affecting Communication: Other (comment);Reduced clarity of speech   Cognition Arousal: Alert Behavior During Therapy: Flat affect Cognition: History  of cognitive impairments                               Following commands: Intact        Cueing      Exercises General Exercises - Upper Extremity Shoulder Flexion: (P) Self ROM, Both, 10 reps (pt. with fingers interlocked pulling BUEs eyelevel supine then back down to lap) Elbow Flexion: AROM, Both, 10 reps, Theraband Theraband Level (Elbow Flexion): Level 1 (Yellow) Elbow Extension: AROM, Both, 10 reps, Theraband Theraband Level (Elbow Extension): Level 1 (Yellow) Other Exercises Other Exercises: patient attempted to participate in yellow theraband exercises on this date. patient empericaly weak with elbow flexion/extension and attempts at [facility]. patient minimally moveing UE with or without bands. patient was educated on importance of BUE movement when in room. patient verbalized understanding. Other Exercises: patient was able to complete 10 stress ball squeezes bilaterally. Other Exercises: yellow theraband attached to opposing bed rail.  pt. able to pull in prep for increasing functional use/assistance with pulling on bed rails for pt.  to assist with rolling    Shoulder Instructions       General Comments      Pertinent Vitals/ Pain       Pain Assessment Pain Assessment: No/denies pain  Home Living                                          Prior Functioning/Environment              Frequency  Min 1X/week        Progress Toward Goals  OT Goals(current goals can now be found in the care plan section)        Plan      Co-evaluation                 AM-PAC OT "6 Clicks" Daily Activity     Outcome Measure   Help from another person eating meals?: A Little Help from another person taking care of personal grooming?: A Lot Help from another person toileting, which includes using toliet, bedpan, or urinal?: Total Help from another person bathing (including washing, rinsing, drying)?: Total Help from another person to put on  and taking off regular upper body clothing?: Total Help from another person to put on and taking off regular lower body clothing?: Total 6 Click Score: 9    End of Session Equipment Utilized During Treatment: Other (comment) (theraband-yellow)  OT Visit Diagnosis: Muscle weakness (generalized) (M62.81)   Activity Tolerance Patient tolerated treatment well   Patient Left in bed;with call bell/phone within reach   Nurse Communication Other (comment) (rn aware pt. having OT today)        Time: 5284-1324 OT Time Calculation (min): 23 min  Charges: OT General Charges $OT Visit: 1 Visit OT Treatments $Therapeutic Exercise: 23-37 mins  Boneta Lucks, COTA/L Acute Rehabilitation [phone]   Alessandra Bevels Lorraine-COTA/L 12/08/2023, 1:58 PM

## 2023-12-08 NOTE — TOC Transition Note (Addendum)
 Transition of Care [facility]) - Discharge Note   Patient Details  Name: Anna Bernard MRN: [id number] Date of Birth: 18-Aug-1951  [facility] CM/SW Contact:  Lanier Clam, RN Phone Number: 12/08/2023, 1:11 PM   Clinical Narrative: Sherron Monday to douglas(spouse), & Kretha(dtr) about d/c plans-agree to HHPT/OT-no preference-[facility] rep [place] HHPT/OT-SOC home visit sunday-dtr in agreement.PTAR for transport home-confirmed address.No further CM needs. -3:36p PTAR called. No further CM needs.      Final next level of care: Home w Home Health Services Barriers to Discharge: No Barriers Identified   Patient Goals and CMS Choice Patient states their goals for this hospitalization and ongoing recovery are:: Home CMS Medicare.gov Compare Post Acute Care list provided to:: Patient Represenative (must comment) (Douglas(spouse) Kretha(dtr)) Choice offered to / list presented to : Spouse [place] ownership interest in [facility]provided to:: Spouse    Discharge Placement                       Discharge Plan and Services Additional resources added to the After Visit Summary for     Discharge Planning Services: CM Consult Post Acute Care Choice: Home Health                    HH Arranged: PT, OT [facility] Agency: [facility] Date [facility] Agency Contacted: 12/08/23 Time HH Agency Contacted: 1311 Representative spoke with at [facility] Agency: Adele Dan  Social Drivers of Health (SDOH) Interventions SDOH Screenings   Food Insecurity: No Food Insecurity (12/01/2023)  Housing: Low Risk  (12/01/2023)  Transportation Needs: No Transportation Needs (12/01/2023)  Utilities: Not At Risk (12/01/2023)  Recent Concern: Utilities - At Risk (11/29/2023)  Alcohol Screen: Low Risk  (01/05/2023)  Depression (PHQ2-9): High Risk (07/28/2023)  Financial Resource Strain: Low Risk  (01/05/2023)  Physical Activity: Inactive (01/05/2023)  Social Connections: Moderately Isolated  (11/29/2023)  Stress: No Stress Concern Present (01/05/2023)  Tobacco Use: Medium Risk (11/29/2023)     Readmission Risk Interventions    12/01/2023    4:57 PM 01/17/2023    1:37 PM  Readmission Risk Prevention Plan  Transportation Screening Complete Complete  PCP or Specialist Appt within 5-7 Days Complete Complete  Home Care Screening Complete Complete  Medication Review (RN CM) Complete Referral to Pharmacy

## 2023-12-08 NOTE — Plan of Care (Signed)
 Pt spouse and pt received, educated on, and understands copy of discharge instructions.  Pt has all belongings.

## 2023-12-08 NOTE — Plan of Care (Signed)

## 2023-12-08 NOTE — Discharge Summary (Signed)
 Physician Discharge Summary   Patient: Anna Bernard MRN: [id number] DOB: [birth year]  Admit date:     11/28/2023  Discharge date: 12/08/23  Discharge Physician: Dante Cooter   PCP: Billey Co, MD   Recommendations at discharge:      Discharge Diagnoses: Principal Problem:   Acute UTI (urinary tract infection) Active Problems:   ESBL (extended spectrum beta-lactamase) producing bacteria infection   Acute metabolic encephalopathy   Paroxysmal atrial fibrillation (HCC)   Iron deficiency anemia due to chronic blood loss   Essential hypertension   Diabetes mellitus type 2, controlled, without complications (HCC)   Hypothyroidism   Gastroesophageal reflux disease   Vascular parkinsonism (HCC)   Protein-calorie malnutrition, severe (HCC)   Pressure injury of skin  Resolved Problems:   * No resolved hospital problems. [facility] Course: Anna Bernard is a 73 y.o. female with medical history significant of anemia, carotid artery disease, hyperlipidemia, hypertension, hypothyroidism, mild CAD, paroxysmal atrial fibrillation, prediabetes, pulmonary emboli, right subclavian artery, vitamin B12 deficiency who was brought to the emergency department via EMS after they were called by her daughter stating that the patient has decreased mentation with increased somnolence, decreased oral intake and inability to help with transfers.  She is unable to provide further information from now and only follows simple commands.   Lab work: Urinalysis with Sorbid with small hemoglobin and small leukocyte esterase.  There were ketones of 5 and protein greater than 300 mg/dL.  Microscopic examination showed greater than 50 WBC, greater than 50 RBC, many bacteria and positive WBC clumps.  CBC showed a white count 12.0, hemoglobin 9.2 g/dL platelets 578.  Coronavirus, influenza and RSV PCR was negative.  CMP showed a chloride of 115 and CO2 of 18 mmol/L with a normal anion gap.  Corrected calcium was  7.9, glucose 96, BUN 39 and creatinine 0.71 mg/dL.  Total protein 5.5 and albumin 2.2 g deciliter, the rest of the LFTs were normal.  Lactic acid x 2 and troponin x 2 were normal.  Coronavirus, influenza and RSV PCR test was negative.   Imaging: Portable 1 view chest radiograph with no active disease.   ED course: Initial vital signs were temperature 99.4 F, then increased to 100.5 F, pulse 86, respiration 23, O2 sat 95% on room air and BP 103/62 mmHg.  The patient received 2000 mL of normal saline bolus, ceftriaxone 2 g IVPB, calcium gluconate 1 g IVPB and acetaminophen 650 mg suppository PR x 1.  I started the patient on meropenem.     Assessment and Plan:  Acute metabolic encephalopathy ESBL E. coli bacteremia E. coli/Proteus mirabilis UTI Hemorrhagic cystitis Patient presented to ED with progressive weakness, lethargy, confusion.  Also with decreased oral intake, urine output.  History of previous ESBL E. coli urinary tract infections per review of MAR.  Patient with temperature 100.5 F on admission.  WBC count elevated 20.0 with normal lactic acid.  Urinalysis consistent with UTI.  CT head without contrast with no acute intracranial abnormality. Leukocytosis has normalized and patient completed a 10-day course of antibiotic therapy.   Dysphagia Appreciate speech therapy input Continue dysphagia 3 diet Aspiration precautions; assist with feeding    Essential hypertension Paroxysmal atrial fibrillation no longer on anticoagulation Continue metoprolol tartrate 12.5 mg p.o. twice daily    Hyperlipidemia CAD Continue Crestor 10 mg p.o. daily    Hypothyroidism Continue levothyroxine 100 mcg p.o. daily    History of anemia History of small bowel AVM Hemoglobin is  stable    GERD Continue Protonix 40 mg p.o. daiy   History of syncope  Seen by cardiology previously, thought to be secondary to orthostasis; probably as well as autonomic dysfunction with history of  Parkinson's disease. -- Supportive care, fall precautions   Parkinson disease with dementia Follows with neurology outpatient, Dr. Arbutus Leas.  Last seen in clinic on 08/30/2023.  Per neurology note, no ambulates much and memory has been very poor. -- Sinemet 4 times daily -- Melatonin 3 mg p.o. nightly -- Delirium precautions -- Get up during the day -- Encourage a familiar face to remain present throughout the day -- Keep blinds open and lights on during daylight hours -- Minimize the use of opioids/benzodiazepines    Weakness/debility/deconditioning: -- PT/OT recommend home health PT/OT -- Continue therapy efforts while inpatient      Pressure injury, right buttock, stage I sacrum, ischial tuberosity, POA Pressure Injury 11/29/23 Buttocks Right Deep Tissue Pressure Injury - Purple or maroon localized area of discolored intact skin or blood-filled blister due to damage of underlying soft tissue from pressure and/or shear. unblanchable area, dark in color w (Active)  11/29/23 0700  Location: Buttocks  Location Orientation: Right  Staging: Deep Tissue Pressure Injury - Purple or maroon localized area of discolored intact skin or blood-filled blister due to damage of underlying soft tissue from pressure and/or shear.  Wound Description (Comments): unblanchable area, dark in color with blistered skin  Present on Admission: Yes     Pressure Injury 11/29/23 Sacrum Mid Stage 1 -  Intact skin with non-blanchable redness of a localized area usually over a bony prominence. (Active)  11/29/23 0700  Location: Sacrum  Location Orientation: Mid  Staging: Stage 1 -  Intact skin with non-blanchable redness of a localized area usually over a bony prominence.  Wound Description (Comments):   Present on Admission: Yes     Pressure Injury 11/29/23 Ischial tuberosity Right Deep Tissue Pressure Injury - Purple or maroon localized area of discolored intact skin or blood-filled blister due to damage of  underlying soft tissue from pressure and/or shear. (Active)  11/29/23 0700  Location: Ischial tuberosity  Location Orientation: Right  Staging: Deep Tissue Pressure Injury - Purple or maroon localized area of discolored intact skin or blood-filled blister due to damage of underlying soft tissue from pressure and/or shear.  Wound Description (Comments):   Present on Admission: Yes     Pressure Injury Ischial tuberosity Left Deep Tissue Pressure Injury - Purple or maroon localized area of discolored intact skin or blood-filled blister due to damage of underlying soft tissue from pressure and/or shear. (Active)     Location: Ischial tuberosity  Location Orientation: Left  Staging: Deep Tissue Pressure Injury - Purple or maroon localized area of discolored intact skin or blood-filled blister due to damage of underlying soft tissue from pressure and/or shear.  Wound Description (Comments):   Present on Admission: Yes  Seen by wound RN, continue local wound care, offloading, air mattress            Consultants: None Procedures performed: None  Disposition: Home health Diet recommendation:  Discharge Diet Orders (From admission, onward)     Start     Ordered   12/08/23 0000  Diet - low sodium heart healthy        12/08/23 1320           Dysphagia type 3 thin Liquid DISCHARGE MEDICATION: Allergies as of 12/08/2023   No Known Allergies  Medication List     STOP taking these medications    famotidine 20 MG tablet Commonly known as: PEPCID   trimethoprim 100 MG tablet Commonly known as: TRIMPEX       TAKE these medications    ascorbic acid 500 MG tablet Commonly known as: VITAMIN C Take 1 tablet (500 mg total) by mouth daily. Start taking on: December 09, 2023   Carbidopa-Levodopa ER 25-100 MG tablet controlled release Commonly known as: SINEMET CR TAKE 3 TABLETS AT 7:00AM, 2 TABLETS AT 11:00AM, AND 2 TABLETS AT 4:00PM DAILY What changed:  how much to  take how to take this when to take this additional instructions   diclofenac Sodium 1 % Gel Commonly known as: VOLTAREN APPLY 4 GRAMS TOPICALLY 4 TIMES A DAY What changed: See the new instructions.   DULoxetine 20 MG capsule Commonly known as: CYMBALTA Take 20 mg by mouth every morning.   feeding supplement Liqd Take 237 mLs by mouth 2 (two) times daily between meals.   iron polysaccharides 150 MG capsule Commonly known as: NIFEREX Take 1 capsule (150 mg total) by mouth daily. Start taking on: December 09, 2023   levothyroxine 100 MCG tablet Commonly known as: SYNTHROID TAKE 1 TABLET BY MOUTH EVERY MORNING 30 MINUTES BEFORE FOOD   melatonin 3 MG Tabs tablet Take 3 mg by mouth at bedtime as needed (For sleep).   metoprolol tartrate 25 MG tablet Commonly known as: LOPRESSOR TAKE 1/2 TABLET TWICE A DAY BY MOUTH   multivitamin with minerals Tabs tablet Take 1 tablet by mouth every other day.   pantoprazole 40 MG tablet Commonly known as: PROTONIX NEW PRESCRIPTION REQUEST: TAKE ONE TABLET BY MOUTH TWICE DAILY What changed: See the new instructions.   rosuvastatin 10 MG tablet Commonly known as: CRESTOR Take 1 tablet (10 mg total) by mouth every evening.   senna-docusate 8.6-50 MG tablet Commonly known as: Senokot-S Take 1 tablet by mouth at bedtime. Hold if diarrhea   Tylenol 8 Hour Arthritis Pain 650 MG CR tablet Generic drug: acetaminophen Take 1,300 mg by mouth every 8 (eight) hours as needed for pain.               Discharge Care Instructions  (From admission, onward)           Start     Ordered   12/08/23 0000  Discharge wound care:       Comments: Cleanse buttocks with soap and water, dry and apply Xeroform gauze [place] [phone]) daily to purple maroon discoloration.  Cover with silicone foam or ABD pad whichever is preferred.   12/08/23 1320            Follow-up Information     [facility], [facility] Follow up.   Why: [facility]  physical/occupational therapy Contact information: [address] [place] [address] [phone]         Billey Co, MD Follow up in 7 day(s).   Specialty: Family Medicine Contact information: [address] [place] [address] [phone]                Discharge Exam: Ceasar Mons Weights   11/29/23 1216  Weight: 72.3 kg   GEN: NAD, weak, chronically ill appearance, appears older than stated age HEENT: NCAT, PERRL, EOMI, sclera clear, moist mucous membranes PULM: CTAB w/o wheezes/crackles, normal respiratory effort, on room air CV: RRR w/o M/G/R GI: abd soft, NTND, + BS MSK: no peripheral edema Integumentary: No rashes/lesions/wounds  nonexposed skin surfaces    Condition at discharge: stable  The results of significant diagnostics from this hospitalization (including imaging, microbiology, ancillary and laboratory) are listed below for reference.   Imaging Studies: US RENAL Result Date: 11/29/2023 CLINICAL DATA:  Hematuria. EXAM: RENAL / URINARY TRACT ULTRASOUND COMPLETE COMPARISON:  CT 01/01/2022 FINDINGS: Right Kidney: Renal measurements: 14.4 x 5.4 x 6 cm = volume: 246 mL. Moderate hydronephrosis. Mild thinning of the renal parenchyma. No visualized stone or focal lesion. Left Kidney: Renal measurements: 12.3 x 6.3 x 4.3 cm = volume: 175 mL. Kidney is poorly assessed on the current exam due to overlying shadowing. No hydronephrosis. No obvious focal lesion or stone. Bladder: Moderately distended. There is intraluminal debris as well as diffuse wall thickening. Neither ureteral jet is demonstrated. Other: None. IMPRESSION: 1. Moderate right hydronephrosis. Slight thinning of the right renal parenchyma. 2. No left hydronephrosis. More detailed left renal assessment is limited. 3. Moderate bladder distension with debris and wall thickening. Electronically Signed   By: Narda Rutherford M.D.   On: 11/29/2023 22:43   CT HEAD WO CONTRAST ( ) Result Date:  11/29/2023 CLINICAL DATA:  Altered mental status EXAM: CT HEAD WITHOUT CONTRAST TECHNIQUE: Contiguous axial images were obtained from the base of the skull through the vertex without intravenous contrast. RADIATION DOSE REDUCTION: This exam was performed according to the departmental dose-optimization program which includes automated exposure control, adjustment of the mA and/or kV according to patient size and/or use of iterative reconstruction technique. COMPARISON:  None Available. FINDINGS: Brain: There is no mass, hemorrhage or extra-axial collection. The size and configuration of the ventricles and extra-axial CSF spaces are normal. There is hypoattenuation of the white matter, most commonly indicating chronic small vessel disease. Vascular: Atherosclerotic calcification of the internal carotid arteries at the skull base. No abnormal hyperdensity of the major intracranial arteries or dural venous sinuses. Skull: The visualized skull base, calvarium and extracranial soft tissues are normal. Sinuses/Orbits: No fluid levels or advanced mucosal thickening of the visualized paranasal sinuses. No mastoid or middle ear effusion. Normal orbits. Other: None. IMPRESSION: 1. No acute intracranial abnormality. 2. Chronic small vessel disease. Electronically Signed   By: Deatra Robinson M.D.   On: 11/29/2023 19:38   DG Chest Portable 1 View Result Date: 11/28/2023 CLINICAL DATA:  Altered mental status. EXAM: PORTABLE CHEST 1 VIEW COMPARISON:  11/17/2022 FINDINGS: Heart size and mediastinal contours appear normal. There is no pleural fluid, interstitial edema or airspace consolidation. No acute osseous findings. IMPRESSION: No active disease. Electronically Signed   By: Signa Kell M.D.   On: 11/28/2023 12:07    Microbiology: Results for orders placed or performed during the hospital encounter of 11/28/23  Resp panel by RT-PCR (RSV, Flu A&B, Covid)     Status: None   Collection Time: 11/28/23  9:20 AM   Specimen:  Nasal Swab  Result Value Ref Range Status   SARS Coronavirus 2 by RT PCR NEGATIVE NEGATIVE Final    Comment: (NOTE) SARS-CoV-2 target nucleic acids are NOT DETECTED.  The SARS-CoV-2 RNA is generally detectable in upper respiratory specimens during the acute phase of infection. The lowest concentration of SARS-CoV-2 viral copies this assay can detect is 138 copies/mL. A negative result does not preclude SARS-Cov-2 infection and should not be used as the sole basis for treatment or other patient management decisions. A negative result may occur with  improper specimen collection/handling, submission of specimen other than nasopharyngeal swab, presence of viral mutation(s) within the areas targeted  by this assay, and inadequate number of viral copies(<138 copies/mL). A negative result must be combined with clinical observations, patient history, and epidemiological information. The expected result is Negative.  Fact Sheet for Patients:  BloggerCourse.com  Fact Sheet for Healthcare Providers:  SeriousBroker.it  This test is no t yet approved or cleared by the [place] FDA and  has been authorized for detection and/or diagnosis of SARS-CoV-2 by FDA under an Emergency Use Authorization (EUA). This EUA will remain  in effect (meaning this test can be used) for the duration of the COVID-19 declaration under Section 564(b)(1) of the Act, 21 U.S.C.section 360bbb-3(b)(1), unless the authorization is terminated  or revoked sooner.       Influenza A by PCR NEGATIVE NEGATIVE Final   Influenza B by PCR NEGATIVE NEGATIVE Final    Comment: (NOTE) The Xpert Xpress SARS-CoV-2/FLU/RSV plus assay is intended as an aid in the diagnosis of influenza from Nasopharyngeal swab specimens and should not be used as a sole basis for treatment. Nasal washings and aspirates are unacceptable for Xpert Xpress SARS-CoV-2/FLU/RSV testing.  Fact Sheet for  Patients: BloggerCourse.com  Fact Sheet for Healthcare Providers: SeriousBroker.it  This test is not yet approved or cleared by the [place] FDA and has been authorized for detection and/or diagnosis of SARS-CoV-2 by FDA under an Emergency Use Authorization (EUA). This EUA will remain in effect (meaning this test can be used) for the duration of the COVID-19 declaration under Section 564(b)(1) of the Act, 21 U.S.C. section 360bbb-3(b)(1), unless the authorization is terminated or revoked.     Resp Syncytial Virus by PCR NEGATIVE NEGATIVE Final    Comment: (NOTE) Fact Sheet for Patients: BloggerCourse.com  Fact Sheet for Healthcare Providers: SeriousBroker.it  This test is not yet approved or cleared by the [place] FDA and has been authorized for detection and/or diagnosis of SARS-CoV-2 by FDA under an Emergency Use Authorization (EUA). This EUA will remain in effect (meaning this test can be used) for the duration of the COVID-19 declaration under Section 564(b)(1) of the Act, 21 U.S.C. section 360bbb-3(b)(1), unless the authorization is terminated or revoked.  Performed at [facility], [address] [place], [place] [address]   Urine Culture (for pregnant, neutropenic or urologic patients or patients with an indwelling urinary catheter)     Status: Abnormal   Collection Time: 11/28/23  9:44 AM   Specimen: Urine, Clean Catch  Result Value Ref Range Status   Specimen Description   Final    URINE, CLEAN CATCH Performed at [facility], [address], [place], [place] [address]    Special Requests   Final    NONE Performed at [facility], [address], [place], [place] [address]    Culture (A)  Final    90,000 COLONIES/mL PROTEUS MIRABILIS >=100,000 COLONIES/mL ESCHERICHIA COLI Confirmed Extended  Spectrum Beta-Lactamase Producer (ESBL).  In bloodstream infections from ESBL organisms, carbapenems are preferred over piperacillin/tazobactam. They are shown to have a lower risk of mortality.    Report Status 12/02/2023 FINAL  Final   Organism ID, Bacteria PROTEUS MIRABILIS (A)  Final   Organism ID, Bacteria ESCHERICHIA COLI (A)  Final      Susceptibility   Escherichia coli - MIC*    AMPICILLIN >=32 RESISTANT Resistant     CEFAZOLIN >=64 RESISTANT Resistant     CEFEPIME 16 RESISTANT Resistant     CEFTRIAXONE >=64 RESISTANT Resistant     CIPROFLOXACIN >=4 RESISTANT Resistant  GENTAMICIN >=16 RESISTANT Resistant     IMIPENEM <=0.25 SENSITIVE Sensitive     NITROFURANTOIN <=16 SENSITIVE Sensitive     TRIMETH/SULFA >=320 RESISTANT Resistant     AMPICILLIN/SULBACTAM 4 SENSITIVE Sensitive     PIP/TAZO <=4 SENSITIVE Sensitive ug/mL    * >=100,000 COLONIES/mL ESCHERICHIA COLI   Proteus mirabilis - MIC*    AMPICILLIN <=2 SENSITIVE Sensitive     CEFAZOLIN <=4 SENSITIVE Sensitive     CEFEPIME <=0.12 SENSITIVE Sensitive     CEFTRIAXONE <=0.25 SENSITIVE Sensitive     CIPROFLOXACIN <=0.25 SENSITIVE Sensitive     GENTAMICIN <=1 SENSITIVE Sensitive     IMIPENEM 2 SENSITIVE Sensitive     NITROFURANTOIN 128 RESISTANT Resistant     TRIMETH/SULFA <=20 SENSITIVE Sensitive     AMPICILLIN/SULBACTAM <=2 SENSITIVE Sensitive     PIP/TAZO <=4 SENSITIVE Sensitive ug/mL    * 90,000 COLONIES/mL PROTEUS MIRABILIS  Culture, blood (routine x 2)     Status: None   Collection Time: 11/28/23 10:25 AM   Specimen: BLOOD RIGHT FOREARM  Result Value Ref Range Status   Specimen Description   Final    BLOOD RIGHT FOREARM Performed at [facility], [address], [place], [place] [address]    Special Requests   Final    BOTTLES DRAWN AEROBIC AND ANAEROBIC Blood Culture adequate volume Performed at [facility], [address] [place], [place] [address]    Culture   Final    NO  GROWTH 5 DAYS Performed at [facility], [address] [place], [place] [address]    Report Status 12/03/2023 FINAL  Final  Culture, blood (routine x 2)     Status: Abnormal   Collection Time: 11/28/23 10:25 AM   Specimen: BLOOD  Result Value Ref Range Status   Specimen Description   Final    BLOOD LEFT ANTECUBITAL Performed at [facility], [address], [place], [place] [address]    Special Requests   Final    BOTTLES DRAWN AEROBIC AND ANAEROBIC Blood Culture adequate volume Performed at [facility], [address] [place], [place] [address]    Culture  Setup Time   Final    GRAM NEGATIVE RODS AEROBIC BOTTLE ONLY CRITICAL RESULT CALLED TO, READ BACK BY AND VERIFIED WITH: PHARMD T.GREEN AT 1000 ON 11/30/2023 BY T.SAAD. Performed at [facility], [address] [place], [place] [address]    Culture (A)  Final    ESCHERICHIA COLI Confirmed Extended Spectrum Beta-Lactamase Producer (ESBL).  In bloodstream infections from ESBL organisms, carbapenems are preferred over piperacillin/tazobactam. They are shown to have a lower risk of mortality.    Report Status 12/02/2023 FINAL  Final   Organism ID, Bacteria ESCHERICHIA COLI  Final      Susceptibility   Escherichia coli - MIC*    AMPICILLIN >=32 RESISTANT Resistant     CEFEPIME 16 RESISTANT Resistant     CEFTAZIDIME RESISTANT Resistant     CEFTRIAXONE >=64 RESISTANT Resistant     CIPROFLOXACIN >=4 RESISTANT Resistant     GENTAMICIN <=1 SENSITIVE Sensitive     IMIPENEM <=0.25 SENSITIVE Sensitive     TRIMETH/SULFA >=320 RESISTANT Resistant     AMPICILLIN/SULBACTAM 4 SENSITIVE Sensitive     PIP/TAZO <=4 SENSITIVE Sensitive ug/mL    * ESCHERICHIA COLI  Blood Culture ID Panel (Reflexed)     Status: Abnormal   Collection Time: 11/28/23 10:25 AM  Result Value Ref Range Status  Enterococcus faecalis NOT DETECTED NOT DETECTED Final   Enterococcus Faecium NOT DETECTED NOT DETECTED  Final   Listeria monocytogenes NOT DETECTED NOT DETECTED Final   Staphylococcus species NOT DETECTED NOT DETECTED Final   Staphylococcus aureus (BCID) NOT DETECTED NOT DETECTED Final   Staphylococcus epidermidis NOT DETECTED NOT DETECTED Final   Staphylococcus lugdunensis NOT DETECTED NOT DETECTED Final   Streptococcus species NOT DETECTED NOT DETECTED Final   Streptococcus agalactiae NOT DETECTED NOT DETECTED Final   Streptococcus pneumoniae NOT DETECTED NOT DETECTED Final   Streptococcus pyogenes NOT DETECTED NOT DETECTED Final   A.calcoaceticus-baumannii NOT DETECTED NOT DETECTED Final   Bacteroides fragilis NOT DETECTED NOT DETECTED Final   Enterobacterales DETECTED (A) NOT DETECTED Final    Comment: Enterobacterales represent a large order of gram negative bacteria, not a single organism. CRITICAL RESULT CALLED TO, READ BACK BY AND VERIFIED WITH: PHARMD T.GREEN AT 1000 ON 11/30/2023 BY T.SAAD.    Enterobacter cloacae complex NOT DETECTED NOT DETECTED Final   Escherichia coli DETECTED (A) NOT DETECTED Final    Comment: CRITICAL RESULT CALLED TO, READ BACK BY AND VERIFIED WITH: PHARMD T.GREEN AT 1000 ON 11/30/2023 BY T.SAAD.    Klebsiella aerogenes NOT DETECTED NOT DETECTED Final   Klebsiella oxytoca NOT DETECTED NOT DETECTED Final   Klebsiella pneumoniae NOT DETECTED NOT DETECTED Final   Proteus species NOT DETECTED NOT DETECTED Final   Salmonella species NOT DETECTED NOT DETECTED Final   Serratia marcescens NOT DETECTED NOT DETECTED Final   Haemophilus influenzae NOT DETECTED NOT DETECTED Final   Neisseria meningitidis NOT DETECTED NOT DETECTED Final   Pseudomonas aeruginosa NOT DETECTED NOT DETECTED Final   Stenotrophomonas maltophilia NOT DETECTED NOT DETECTED Final   Candida albicans NOT DETECTED NOT DETECTED Final   Candida auris NOT DETECTED NOT DETECTED Final   Candida glabrata NOT DETECTED NOT DETECTED Final   Candida krusei NOT DETECTED NOT DETECTED Final   Candida  parapsilosis NOT DETECTED NOT DETECTED Final   Candida tropicalis NOT DETECTED NOT DETECTED Final   Cryptococcus neoformans/gattii NOT DETECTED NOT DETECTED Final   CTX-M ESBL DETECTED (A) NOT DETECTED Final    Comment: CRITICAL RESULT CALLED TO, READ BACK BY AND VERIFIED WITH: PHARMD T.GREEN AT 1000 ON 11/30/2023 BY T.SAAD. (NOTE) Extended spectrum beta-lactamase detected. Recommend a carbapenem as initial therapy.      Carbapenem resistance IMP NOT DETECTED NOT DETECTED Final   Carbapenem resistance KPC NOT DETECTED NOT DETECTED Final   Carbapenem resistance NDM NOT DETECTED NOT DETECTED Final   Carbapenem resist OXA 48 LIKE NOT DETECTED NOT DETECTED Final   Carbapenem resistance VIM NOT DETECTED NOT DETECTED Final    Comment: Performed at [facility], [address], [place], [place] [address]    Labs: CBC: Recent Labs  Lab 12/02/23 [id number] 12/06/23 0450 12/07/23 0439 12/08/23 0412  WBC 10.2 7.4 6.6 6.9  HGB 11.0* 10.2* 10.1* 9.9*  HCT 33.5* 30.7* 30.2* 30.3*  MCV 80.7 79.9* 79.9* 80.6  PLT 386 492* 497* 499*   Basic Metabolic Panel: Recent Labs  Lab 12/02/23 0504 12/06/23 0450 12/07/23 0439 12/08/23 0412  NA 139 140 138 137  K 3.8 4.0 4.1 4.1  CL 103 103 102 103  CO2 27 30 27 30   GLUCOSE 91 81 73 73  BUN 9 11 14 15   CREATININE 0.47 0.44 0.54 0.38*  CALCIUM 8.9 8.8* 8.6* 8.4*  MG 2.1  --  2.3  --   PHOS  --   --  3.2  --    Liver Function Tests: No results for input(s): "AST", "ALT", "ALKPHOS", "BILITOT", "PROT", "ALBUMIN" in the last 168 hours. CBG: Recent Labs  Lab 12/07/23 1229 12/07/23 1634 12/07/23 2112 12/08/23 0754 12/08/23 1216  GLUCAP 122* 88 106* 77 87    Discharge time spent: greater than 30 minutes.  Signed: Lucile Shutters, MD [facility] 12/08/2023

## 2023-12-10 ENCOUNTER — Telehealth: Payer: Self-pay | Admitting: Student

## 2023-12-10 NOTE — Telephone Encounter (Signed)
 Received after-hours call.  Call came from [facility], representative of [facility].  Patient was supposed to start PT/OT home health within 48 hours, however patient is requesting services started on Tuesday which is outside of her 48-hour window.  Tiffany would like a verbal order to start PT/OT on Tuesday.  This verbal order was provided by Dr. Claudean Severance.

## 2023-12-11 ENCOUNTER — Encounter (HOSPITAL_COMMUNITY): Payer: Self-pay | Admitting: Emergency Medicine

## 2023-12-11 ENCOUNTER — Other Ambulatory Visit: Payer: Self-pay

## 2023-12-11 ENCOUNTER — Emergency Department (HOSPITAL_COMMUNITY)

## 2023-12-11 ENCOUNTER — Telehealth: Payer: Self-pay

## 2023-12-11 ENCOUNTER — Emergency Department (HOSPITAL_COMMUNITY)
Admission: EM | Admit: 2023-12-11 | Discharge: 2023-12-12 | Disposition: A | Discharge status: Home / Self Care | Attending: Student | Admitting: Student

## 2023-12-11 DIAGNOSIS — E039 Hypothyroidism, unspecified: Secondary | ICD-10-CM | POA: Diagnosis not present

## 2023-12-11 DIAGNOSIS — R509 Fever, unspecified: Secondary | ICD-10-CM | POA: Diagnosis not present

## 2023-12-11 DIAGNOSIS — Z79890 Hormone replacement therapy (postmenopausal): Secondary | ICD-10-CM | POA: Diagnosis not present

## 2023-12-11 DIAGNOSIS — Z86711 Personal history of pulmonary embolism: Secondary | ICD-10-CM | POA: Insufficient documentation

## 2023-12-11 DIAGNOSIS — I2510 Atherosclerotic heart disease of native coronary artery without angina pectoris: Secondary | ICD-10-CM | POA: Insufficient documentation

## 2023-12-11 DIAGNOSIS — R079 Chest pain, unspecified: Secondary | ICD-10-CM | POA: Diagnosis not present

## 2023-12-11 DIAGNOSIS — R0789 Other chest pain: Secondary | ICD-10-CM | POA: Insufficient documentation

## 2023-12-11 DIAGNOSIS — R5383 Other fatigue: Secondary | ICD-10-CM | POA: Insufficient documentation

## 2023-12-11 DIAGNOSIS — Z79899 Other long term (current) drug therapy: Secondary | ICD-10-CM | POA: Diagnosis not present

## 2023-12-11 DIAGNOSIS — R6889 Other general symptoms and signs: Secondary | ICD-10-CM | POA: Diagnosis not present

## 2023-12-11 DIAGNOSIS — I480 Paroxysmal atrial fibrillation: Secondary | ICD-10-CM | POA: Insufficient documentation

## 2023-12-11 DIAGNOSIS — Z87891 Personal history of nicotine dependence: Secondary | ICD-10-CM | POA: Insufficient documentation

## 2023-12-11 DIAGNOSIS — R0902 Hypoxemia: Secondary | ICD-10-CM | POA: Diagnosis not present

## 2023-12-11 DIAGNOSIS — I10 Essential (primary) hypertension: Secondary | ICD-10-CM | POA: Diagnosis not present

## 2023-12-11 DIAGNOSIS — Z743 Need for continuous supervision: Secondary | ICD-10-CM | POA: Diagnosis not present

## 2023-12-11 LAB — CBC WITH DIFFERENTIAL/PLATELET
Abs Immature Granulocytes: 0.04 10*3/uL (ref 0.00–0.07)
Basophils Absolute: 0.1 10*3/uL (ref 0.0–0.1)
Basophils Relative: 1 %
Eosinophils Absolute: 0.1 10*3/uL (ref 0.0–0.5)
Eosinophils Relative: 1 %
HCT: 29.5 % — ABNORMAL LOW (ref 36.0–46.0)
Hemoglobin: 10.1 g/dL — ABNORMAL LOW (ref 12.0–15.0)
Immature Granulocytes: 1 %
Lymphocytes Relative: 31 %
Lymphs Abs: 2.1 10*3/uL (ref 0.7–4.0)
MCH: 27 pg (ref 26.0–34.0)
MCHC: 34.2 g/dL (ref 30.0–36.0)
MCV: 78.9 fL — ABNORMAL LOW (ref 80.0–100.0)
Monocytes Absolute: 0.5 10*3/uL (ref 0.1–1.0)
Monocytes Relative: 7 %
Neutro Abs: 4.1 10*3/uL (ref 1.7–7.7)
Neutrophils Relative %: 59 %
Platelets: 458 10*3/uL — ABNORMAL HIGH (ref 150–400)
RBC: 3.74 MIL/uL — ABNORMAL LOW (ref 3.87–5.11)
RDW: 13.9 % (ref 11.5–15.5)
WBC: 6.9 10*3/uL (ref 4.0–10.5)
nRBC: 0 % (ref 0.0–0.2)

## 2023-12-11 LAB — COMPREHENSIVE METABOLIC PANEL
ALT: 7 U/L (ref 0–44)
AST: 33 U/L (ref 15–41)
Albumin: 2.7 g/dL — ABNORMAL LOW (ref 3.5–5.0)
Alkaline Phosphatase: 64 U/L (ref 38–126)
Anion gap: 9 (ref 5–15)
BUN: 13 mg/dL (ref 8–23)
CO2: 26 mmol/L (ref 22–32)
Calcium: 8.6 mg/dL — ABNORMAL LOW (ref 8.9–10.3)
Chloride: 105 mmol/L (ref 98–111)
Creatinine, Ser: 0.49 mg/dL (ref 0.44–1.00)
GFR, Estimated: >60 mL/min (ref >60)
Glucose, Bld: 106 mg/dL — ABNORMAL HIGH (ref 70–99)
Potassium: 4 mmol/L (ref 3.5–5.1)
Sodium: 140 mmol/L (ref 135–145)
Total Bilirubin: 0.4 mg/dL (ref 0.0–1.2)
Total Protein: 6.6 g/dL (ref 6.5–8.1)

## 2023-12-11 LAB — TROPONIN I (HIGH SENSITIVITY): Troponin I (High Sensitivity): 4 ng/L (ref <18)

## 2023-12-11 LAB — I-STAT CG4 LACTIC ACID, ED: Lactic Acid, Venous: 1.2 mmol/L (ref 0.5–1.9)

## 2023-12-11 LAB — BRAIN NATRIURETIC PEPTIDE: B Natriuretic Peptide: 27.2 pg/mL (ref 0.0–100.0)

## 2023-12-11 MED ORDER — ACETAMINOPHEN 500 MG PO TABS
1000.0000 mg | ORAL_TABLET | Freq: Once | ORAL | Status: AC
  Administered 2023-12-11: 1000 mg via ORAL
  Filled 2023-12-11: qty 2

## 2023-12-11 MED ORDER — CARBIDOPA-LEVODOPA ER 25-100 MG PO TBCR: 1.0000 | EXTENDED_RELEASE_TABLET | ORAL | Status: DC

## 2023-12-11 MED ORDER — CARBIDOPA-LEVODOPA ER 25-100 MG PO TBCR
1.0000 | EXTENDED_RELEASE_TABLET | Freq: Every day | ORAL | Status: DC
Start: 2023-12-11 — End: 2023-12-12
  Administered 2023-12-11: 1 via ORAL
  Filled 2023-12-11: qty 1

## 2023-12-11 MED ORDER — CARBIDOPA-LEVODOPA ER 25-100 MG PO TBCR
2.0000 | EXTENDED_RELEASE_TABLET | Freq: Every morning | ORAL | Status: DC
  Filled 2023-12-11: qty 2

## 2023-12-11 MED ORDER — CARBIDOPA-LEVODOPA ER 25-100 MG PO TBCR
2.0000 | EXTENDED_RELEASE_TABLET | Freq: Every day | ORAL | Status: DC
  Filled 2023-12-11: qty 2

## 2023-12-11 MED ORDER — CARBIDOPA-LEVODOPA ER 25-100 MG PO TBCR: 2.0000 | EXTENDED_RELEASE_TABLET | Freq: Every evening | ORAL | Status: DC

## 2023-12-11 NOTE — ED Triage Notes (Signed)
 Pt BIBA from home w/ c/o chest pain and increased lethargy. Has no cardiac hx but r/o going in and out of afib. Family r/o bedsores and being in hospice, which is unconfirmed or not. EMS stated she felt warm to the touch but unable to receive a temp. FSBS 127. Pt is A&O x1 (self)

## 2023-12-11 NOTE — ED Provider Notes (Signed)
 [place] EMERGENCY DEPARTMENT AT [facility] Provider Note  CSN: [id number] Arrival date & time: 12/11/23 4098  Chief Complaint(s) Chest Pain and Fatigue  HPI Anna Bernard is a 73 y.o. female with PMH anemia, CAD, HTN, HLD, hypothyroidism, CAD, paroxysmal A-fib, pulmonary emboli not currently anticoagulated, right subclavian artery stenosis, recent hospital admission with discharge on 12/08/2023 for acute encephalopathy secondary to multidrug-resistant UTI who presents emergency room for evaluation of generalized fatigue and chest pain.  EMS states that she felt warm to the touch and was breathing quickly and had concern about sepsis.  Here in the emergency room, patient states that she feels tired but denies current chest pain, shortness of breath, abdominal pain, nausea, vomiting, headache, fever or other systemic symptoms.   Past Medical History Past Medical History:  Diagnosis Date   Anemia, unspecified    Carotid artery disease (HCC)    Hyperlipidemia, unspecified    Hypertension    Hypothyroidism, unspecified    Mild CAD    PAF (paroxysmal atrial fibrillation) (HCC)    Pre-diabetes    Pulmonary emboli (HCC)    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 by duplex 2023   Vitamin B12 deficiency    Patient Active Problem List   Diagnosis Date Noted   Pressure injury of skin 12/08/2023   Acute UTI (urinary tract infection) 11/28/2023   Protein-calorie malnutrition, severe (HCC) 11/28/2023   Acute metabolic encephalopathy 11/28/2023   Healthcare maintenance 05/04/2023   Iron deficiency anemia 04/04/2023   Hx of pulmonary embolus 04/03/2023   Debility 03/29/2023   Urinary tract infection without hematuria 03/28/2023   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 coli infection 04/29/2022   Need for home health care 04/29/2022   Hypothyroidism 03/03/2022   Non-ST elevation (NSTEMI) myocardial infarction [facility])    Paroxysmal atrial fibrillation (HCC)    Chronic bilateral low back pain without sciatica 01/14/2022   Seborrheic keratoses 07/09/2021   Parkinson disease (HCC) 04/23/2020   Chronic venous insufficiency 11/26/2019   Vitamin B12 deficiency 02/06/2018   Iron deficiency anemia due to chronic blood loss 01/29/2018   Pure hypercholesterolemia 12/24/2014   Diabetes mellitus type 2, controlled, without complications (HCC) 12/24/2014   Bruit of right carotid artery 12/24/2014   Thyroid disease 07/06/2014   Severe obesity (BMI >= 40) (HCC) 07/06/2014   Essential hypertension 07/06/2014   Home Medication(s) Prior to Admission medications   Medication Sig Start Date End Date Taking? Authorizing Provider  ascorbic acid (VITAMIN C) 500 MG tablet Take 1 tablet (500 mg total) by mouth daily. 12/09/23 01/08/24  Agbata, Elwyn Lade, MD  Carbidopa-Levodopa ER (SINEMET CR) 25-100 MG tablet controlled release TAKE 3 TABLETS AT 7:00AM, 2 TABLETS AT 11:00AM, AND 2 TABLETS AT 4:00PM DAILY Patient taking differently: Take 1-2 tablets by mouth See admin instructions. Take 2 tablets by mouth at 0900, 1300, and 1700. Take 1 tablet at 1900 11/21/23   Tat, Rebecca S, DO  diclofenac Sodium (VOLTAREN) 1 % GEL APPLY 4 GRAMS TOPICALLY  4 TIMES A DAY Patient taking differently: Apply 1 Application topically daily as needed (pain). 07/27/23   Billey Co, MD  DULoxetine (CYMBALTA) 20 MG capsule Take 20 mg by mouth every morning. 09/24/23   [provider]  feeding  supplement (ENSURE ENLIVE / ENSURE PLUS) LIQD Take 237 mLs by mouth 2 (two) times daily between meals. 12/08/23   Lucile Shutters, MD  iron polysaccharides (NIFEREX) 150 MG capsule Take 1 capsule (150 mg total) by mouth daily. 12/09/23 01/08/24  Lucile Shutters, MD  levothyroxine (SYNTHROID) 100 MCG tablet TAKE 1 TABLET BY MOUTH EVERY MORNING 30 MINUTES BEFORE FOOD 07/27/23   Billey Co, MD  melatonin 3 MG TABS tablet Take 3 mg by mouth at bedtime as needed (For sleep). 01/24/22   [provider]  metoprolol tartrate (LOPRESSOR) 25 MG tablet TAKE 1/2 TABLET TWICE A DAY BY MOUTH 08/28/23   Billey Co, MD  Multiple Vitamin (MULTIVITAMIN WITH MINERALS) TABS Take 1 tablet by mouth every other day.    [provider]  pantoprazole (PROTONIX) 40 MG tablet NEW PRESCRIPTION REQUEST: TAKE ONE TABLET BY MOUTH TWICE DAILY Patient taking differently: Take 40 mg by mouth daily as needed (heartburn). 12/22/22   Billey Co, MD  rosuvastatin (CRESTOR) 10 MG tablet Take 1 tablet (10 mg total) by mouth every evening. 03/14/23 03/13/24  Quintella Reichert, MD  senna-docusate (SENOKOT-S) 8.6-50 MG tablet Take 1 tablet by mouth at bedtime. Hold if diarrhea 11/20/22   Albertine Grates, MD  TYLENOL 8 HOUR ARTHRITIS PAIN 650 MG CR tablet Take 1,300 mg by mouth every 8 (eight) hours as needed for pain.    [provider]                                                                                                                                    Past Surgical History Past Surgical History:  Procedure Laterality Date   ABDOMINAL HYSTERECTOMY     BIOPSY  01/13/2023   Procedure: BIOPSY;  Surgeon: Jeani Hawking, MD;  Location: [facility] ENDOSCOPY;  Service: Gastroenterology;;   COLONOSCOPY WITH PROPOFOL N/A 01/15/2023   Procedure: COLONOSCOPY WITH PROPOFOL;  Surgeon: Tressia Danas, MD;  Location: [facility] ENDOSCOPY;  Service: Gastroenterology;  Laterality: N/A;   ENTEROSCOPY N/A 11/18/2022   Procedure:  ENTEROSCOPY;  Surgeon: Jeani Hawking, MD;  Location: [facility] ENDOSCOPY;  Service: Gastroenterology;  Laterality: N/A;   ENTEROSCOPY N/A 01/13/2023   Procedure: ENTEROSCOPY;  Surgeon: Jeani Hawking, MD;  Location: [facility] ENDOSCOPY;  Service: Gastroenterology;  Laterality: N/A;   GIVENS CAPSULE STUDY N/A 04/16/2018   Procedure: GIVENS CAPSULE STUDY;  Surgeon: Jeani Hawking, MD;  Location: [facility] ENDOSCOPY;  Service: Endoscopy;  Laterality: N/A;   GIVENS CAPSULE STUDY  01/15/2023   Procedure: GIVENS CAPSULE STUDY;  Surgeon: Tressia Danas, MD;  Location: [facility] ENDOSCOPY;  Service: Gastroenterology;;   HOT HEMOSTASIS N/A 11/18/2022   Procedure: HOT HEMOSTASIS (ARGON PLASMA  COAGULATION/BICAP);  Surgeon: Jeani Hawking, MD;  Location: [facility] ENDOSCOPY;  Service: Gastroenterology;  Laterality: N/A;   PARS PLANA VITRECTOMY Right 08/16/2020   Procedure: 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;  Surgeon: Carmela Rima, MD;  Location: [facility] OR;  Service: Ophthalmology;  Laterality: Right;   PARS PLANA VITRECTOMY Right 09/29/2020   Procedure: PARS PLANA VITRECTOMY WITH 25 GAUGE - ENDOCAUTRY;  Surgeon: Carmela Rima, MD;  Location: [facility] OR;  Service: Ophthalmology;  Laterality: Right;   PHOTOCOAGULATION WITH LASER Right 08/16/2020   Procedure: PHOTOCOAGULATION WITH LASER;  Surgeon: Carmela Rima, MD;  Location: [facility] OR;  Service: Ophthalmology;  Laterality: Right;   PHOTOCOAGULATION WITH LASER Right 09/29/2020   Procedure: PHOTOCOAGULATION WITH LASER;  Surgeon: Carmela Rima, MD;  Location: [facility] OR;  Service: Ophthalmology;  Laterality: Right;   POLYPECTOMY  01/15/2023   Procedure: POLYPECTOMY;  Surgeon: Tressia Danas, MD;  Location: [facility] ENDOSCOPY;  Service: Gastroenterology;;   SHOULDER SURGERY     TONSILLECTOMY     TUBAL LIGATION     Family History Family History  Problem Relation Age of Onset   Renal Disease Sister    Cancer Sister     Social History Social History   Tobacco Use    Smoking status: Former    Passive exposure: Past   Smokeless tobacco: Never   Tobacco comments:    quit 30+ years ago  Vaping Use   Vaping status: Never Used  Substance Use Topics   Alcohol use: No    Alcohol/week: 0.0 standard drinks of alcohol   Drug use: No   Allergies Patient has no known allergies.  Review of Systems Review of Systems  Constitutional:  Positive for fatigue.    Physical Exam Vital Signs  I have reviewed the triage vital signs BP 132/77   Pulse 72   Temp 97.9 F (36.6 C)   Resp 19   SpO2 100%   Physical Exam Vitals and nursing note reviewed.  Constitutional:      General: She is not in acute distress.    Appearance: She is well-developed.  HENT:     Head: Normocephalic and atraumatic.  Eyes:     Conjunctiva/sclera: Conjunctivae normal.  Cardiovascular:     Rate and Rhythm: Normal rate and regular rhythm.     Heart sounds: No murmur heard. Pulmonary:     Effort: Pulmonary effort is normal. No respiratory distress.     Breath sounds: Normal breath sounds.  Abdominal:     Palpations: Abdomen is soft.     Tenderness: There is no abdominal tenderness.  Musculoskeletal:        General: No swelling.     Cervical back: Neck supple.  Skin:    General: Skin is warm and dry.     Capillary Refill: Capillary refill takes less than 2 seconds.     Findings: Lesion present.  Neurological:     Mental Status: She is alert.  Psychiatric:        Mood and Affect: Mood normal.     ED Results and Treatments Labs (all labs ordered are listed, but only abnormal results are displayed) Labs Reviewed  COMPREHENSIVE METABOLIC PANEL - Abnormal; Notable for the following components:      Result Value   Glucose, Bld 106 (*)    Calcium 8.6 (*)    Albumin 2.7 (*)    All other components within normal limits  CBC WITH DIFFERENTIAL/PLATELET - Abnormal; Notable for the following components:   RBC  3.74 (*)    Hemoglobin 10.1 (*)    HCT 29.5 (*)    MCV 78.9  (*)    Platelets 458 (*)    All other components within normal limits  BRAIN NATRIURETIC PEPTIDE  I-STAT CG4 LACTIC ACID, ED  TROPONIN I (HIGH SENSITIVITY)                                                                                                                          Radiology No results found.  Pertinent labs & imaging results that were available during my care of the patient were reviewed by me and considered in my medical decision making (see MDM for details).  Medications Ordered in ED Medications  acetaminophen (TYLENOL) tablet 1,000 mg (1,000 mg Oral Given 12/11/23 2300)                                                                                                                                     Procedures Procedures  (including critical care time)  Medical Decision Making / ED Course   This patient presents to the ED for concern of chest pain, fatigue, this involves an extensive number of treatment options, and is a complaint that carries with it a high risk of complications and morbidity.  The differential diagnosis includes ACS, Aortic Dissection, Pneumothorax, Pneumonia, Esophageal Rupture, PE, Tamponade/Pericardial Effusion, pericarditis, esophageal spasm, dysrhythmia, GERD, costochondritis.  MDM: Patient seen emergency room for evaluation of chest pain and fatigue.  Physical exam with previously documented stage I pressure injury over the right buttock but is otherwise unremarkable.  Laboratory evaluation with a hemoglobin of 10.1 but is otherwise unremarkable.  Lactic acid is normal.  High-sensitivity troponin is negative.  ECG in normal sinus rhythm.  Chest x-ray unremarkable.  At no point here in the emergency room was the patient complained of chest pain and has no SIRS criteria.  She was just treated with 10 days of meropenem and I have exceedingly low suspicion for bacteremia given this recent completed therapy especially in negative lactic acid and normal  vital signs.  She is low/moderate risk by Wells criteria, but she has no shortness of breath today or tachycardia and I have overall lower suspicion for new PE.  Heart score is low and I have low suspicion for ACS.  At this time with symptoms resolved and overall reassuring workup she does not meet inpatient  criteria for admission and will be discharged with outpatient follow-up.  Return precautions given to family which they voiced understanding.   Additional history obtained: -Additional history obtained from multiple family members -External records from outside source obtained and reviewed including: Chart review including previous notes, labs, imaging, consultation notes   Lab Tests: -I ordered, reviewed, and interpreted labs.   The pertinent results include:   Labs Reviewed  COMPREHENSIVE METABOLIC PANEL - Abnormal; Notable for the following components:      Result Value   Glucose, Bld 106 (*)    Calcium 8.6 (*)    Albumin 2.7 (*)    All other components within normal limits  CBC WITH DIFFERENTIAL/PLATELET - Abnormal; Notable for the following components:   RBC 3.74 (*)    Hemoglobin 10.1 (*)    HCT 29.5 (*)    MCV 78.9 (*)    Platelets 458 (*)    All other components within normal limits  BRAIN NATRIURETIC PEPTIDE  I-STAT CG4 LACTIC ACID, ED  TROPONIN I (HIGH SENSITIVITY)      EKG   EKG Interpretation Date/Time:  Monday December 11 2023 19:08:49 EDT Ventricular Rate:  82 PR Interval:  193 QRS Duration:  86 QT Interval:  387 QTC Calculation: 452 R Axis:   69  Text Interpretation: Sinus rhythm Confirmed by Zuleima Haser ([id number]) on 12/11/2023 7:49:19 PM         Imaging Studies ordered: I ordered imaging studies including chest x-ray I independently visualized and interpreted imaging. I agree with the radiologist interpretation   Medicines ordered and prescription drug management: Meds ordered this encounter  Medications   DISCONTD: Carbidopa-Levodopa ER  (SINEMET CR) 25-100 MG tablet controlled release 1-2 tablet   acetaminophen (TYLENOL) tablet 1,000 mg   DISCONTD: Carbidopa-Levodopa ER (SINEMET CR) 25-100 MG tablet controlled release 2 tablet   DISCONTD: Carbidopa-Levodopa ER (SINEMET CR) 25-100 MG tablet controlled release 2 tablet   DISCONTD: Carbidopa-Levodopa ER (SINEMET CR) 25-100 MG tablet controlled release 2 tablet   DISCONTD: Carbidopa-Levodopa ER (SINEMET CR) 25-100 MG tablet controlled release 1 tablet    -I have reviewed the patients home medicines and have made adjustments as needed  Critical interventions none    Cardiac Monitoring: The patient was maintained on a cardiac monitor.  I personally viewed and interpreted the cardiac monitored which showed an underlying rhythm of: NSR  Social Determinants of Health:  Factors impacting patients care include: none   Reevaluation: After the interventions noted above, I reevaluated the patient and found that they have :stayed the same  Co morbidities that complicate the patient evaluation  Past Medical History:  Diagnosis Date   Anemia, unspecified    Carotid artery disease (HCC)    Hyperlipidemia, unspecified    Hypertension    Hypothyroidism, unspecified    Mild CAD    PAF (paroxysmal atrial fibrillation) (HCC)    Pre-diabetes    Pulmonary emboli (HCC)    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 by duplex 2023   Vitamin B12 deficiency       Dispostion: I considered admission for this patient, but at this time she does not meet inpatient criteria for admission and will be discharged with outpatient follow-up     Final Clinical Impression(s) / ED Diagnoses Final diagnoses:  Atypical chest pain  Other fatigue     @PCDICTATION @    Kishaun Erekson, Wyn Forster, MD 12/14/23 1320

## 2023-12-11 NOTE — Transitions of Care (Post Inpatient/ED Visit) (Signed)
   12/11/2023  Name: SURAH PELLEY MRN: [id number] DOB: 06/07/1951  Today's TOC FU Call Status: Today's TOC FU Call Status:: Unsuccessful Call (1st Attempt) Unsuccessful Call (1st Attempt) Date: 12/11/23  Attempted to reach the patient regarding the most recent Inpatient/ED visit.  Follow Up Plan: Additional outreach attempts will be made to reach the patient to complete the Transitions of Care (Post Inpatient/ED visit) call.   Hilbert Odor RN, CCM [place]  VBCI-Population Health RN Care Manager [phone]

## 2023-12-12 ENCOUNTER — Telehealth: Payer: Self-pay

## 2023-12-12 DIAGNOSIS — Z7401 Bed confinement status: Secondary | ICD-10-CM | POA: Diagnosis not present

## 2023-12-12 DIAGNOSIS — R6889 Other general symptoms and signs: Secondary | ICD-10-CM | POA: Diagnosis not present

## 2023-12-12 DIAGNOSIS — R079 Chest pain, unspecified: Secondary | ICD-10-CM | POA: Diagnosis not present

## 2023-12-12 DIAGNOSIS — Z743 Need for continuous supervision: Secondary | ICD-10-CM | POA: Diagnosis not present

## 2023-12-12 DIAGNOSIS — R404 Transient alteration of awareness: Secondary | ICD-10-CM | POA: Diagnosis not present

## 2023-12-12 NOTE — Transitions of Care (Post Inpatient/ED Visit) (Signed)
   12/12/2023  Name: Anna Bernard MRN: [id number] DOB: 04/07/1951  Today's TOC FU Call Status: Today's TOC FU Call Status:: Unsuccessful Call (2nd Attempt) Unsuccessful Call (2nd Attempt) Date: 12/12/23  Attempted to reach the patient regarding the most recent Inpatient/ED visit.  Follow Up Plan: Additional outreach attempts will be made to reach the patient to complete the Transitions of Care (Post Inpatient/ED visit) call.   Hilbert Odor RN, CCM [place]  VBCI-Population Health RN Care Manager [phone]

## 2023-12-13 ENCOUNTER — Telehealth: Payer: Self-pay

## 2023-12-13 NOTE — Transitions of Care (Post Inpatient/ED Visit) (Signed)
   12/13/2023  Name: DAIYA TAMER MRN: [id number] DOB: 1951/08/24  Today's TOC FU Call Status: Today's TOC FU Call Status:: Successful TOC FU Call Completed TOC FU Call Complete Date: 12/13/23  [facility] RN spoke with patient's daughter who denied any needs at this time. Patient not enrolled.   No further outreach indicated.      Hilbert Odor RN, CCM [place]  VBCI-Population Health RN Care Manager [phone]

## 2024-01-13 ENCOUNTER — Other Ambulatory Visit: Payer: Self-pay | Admitting: Family Medicine

## 2024-01-13 ENCOUNTER — Other Ambulatory Visit: Payer: Self-pay | Admitting: Cardiology

## 2024-01-13 DIAGNOSIS — K219 Gastro-esophageal reflux disease without esophagitis: Secondary | ICD-10-CM

## 2024-01-13 DIAGNOSIS — E039 Hypothyroidism, unspecified: Secondary | ICD-10-CM

## 2024-01-18 NOTE — Progress Notes (Deleted)
    SUBJECTIVE:   CHIEF COMPLAINT / HPI:   Pressure ulcer-  Hospital follow up-   Goals of care-   PERTINENT  PMH / PSH: ***  OBJECTIVE:   There were no vitals taken for this visit.  General: A&O, NAD HEENT: No sign of trauma, EOM grossly intact Cardiac: RRR, no m/r/g Respiratory: CTAB, normal WOB, no w/c/r GI: Soft, NTTP, non-distended  Extremities: NTTP, no peripheral edema. Neuro: Normal gait, moves all four extremities appropriately. Psych: Appropriate mood and affect   ASSESSMENT/PLAN:   Assessment & Plan      Anna Cooter, MD [facility]

## 2024-01-19 ENCOUNTER — Inpatient Hospital Stay: Admitting: Family Medicine

## 2024-01-29 ENCOUNTER — Encounter: Payer: Self-pay | Admitting: Family Medicine

## 2024-01-29 ENCOUNTER — Ambulatory Visit (INDEPENDENT_AMBULATORY_CARE_PROVIDER_SITE_OTHER): Admitting: Family Medicine

## 2024-01-29 ENCOUNTER — Telehealth: Payer: Self-pay | Admitting: Family Medicine

## 2024-01-29 VITALS — BP 122/73 | HR 74

## 2024-01-29 DIAGNOSIS — Z1231 Encounter for screening mammogram for malignant neoplasm of breast: Secondary | ICD-10-CM | POA: Diagnosis not present

## 2024-01-29 DIAGNOSIS — E43 Unspecified severe protein-calorie malnutrition: Secondary | ICD-10-CM | POA: Diagnosis not present

## 2024-01-29 DIAGNOSIS — Z7189 Other specified counseling: Secondary | ICD-10-CM

## 2024-01-29 DIAGNOSIS — I480 Paroxysmal atrial fibrillation: Secondary | ICD-10-CM | POA: Diagnosis not present

## 2024-01-29 DIAGNOSIS — D649 Anemia, unspecified: Secondary | ICD-10-CM

## 2024-01-29 DIAGNOSIS — N39498 Other specified urinary incontinence: Secondary | ICD-10-CM | POA: Diagnosis not present

## 2024-01-29 MED ORDER — PANTOPRAZOLE SODIUM 40 MG PO TBEC: 40.0000 mg | DELAYED_RELEASE_TABLET | Freq: Every day | ORAL | 1 refills | Status: DC | PRN

## 2024-01-29 MED ORDER — DULOXETINE HCL 20 MG PO CPEP: 20.0000 mg | ORAL_CAPSULE | Freq: Every morning | ORAL | 3 refills | Status: DC

## 2024-01-29 NOTE — Telephone Encounter (Signed)
 Patients husband came back into the office requesting a urine cup stated the provider this morning wanted a urine sample. I Anna Bernard he was unable to give patients husband a urine cup as there were no orders placed.   Please call when orders have been placed.   Thanks!

## 2024-01-29 NOTE — Assessment & Plan Note (Signed)
 Discussed with patient as she is alert and oriented today Discussed code status- she is considering DNR but prefers to remain full code, they are discussing with palliative care as they follow outpatient as well, we discussed risks of CPR/ACLS today Also discussed HCPOA, living will/advances directives, daughter and husband present and asked questions, packet given to fill out and discussed we could notarize here in clinic Plan for home visit in 2 months when schedule for July is available- can book at 1130/1110 spot for home visit.

## 2024-01-29 NOTE — Addendum Note (Signed)
 Addended by: Avanell Bob E on: 01/29/2024 12:11 PM   Modules accepted: Orders

## 2024-01-29 NOTE — Assessment & Plan Note (Signed)
 She requires 3 Ensures per day due to difficulty feeding with her Parkinson's disease to maintain adequate nutrition.

## 2024-01-29 NOTE — Patient Instructions (Signed)
 It was wonderful to see you today.  Please bring ALL of your medications with you to every visit.   Today we talked about:  I will send in orders for incontinence supplies and her Ensure, please call our clinic in 2-3 weeks if you have not heard from us .  We will check some lab work today  Thank you for choosing [facility].   Please call [phone] with any questions about today's appointment.  Please arrive at least 15 minutes prior to your scheduled appointments.   If you had blood work today, I will send you a MyChart message or a letter if results are normal. Otherwise, I will give you a call.   If you had a referral placed, they will call you to set up an appointment. Please give us  a call if you don't hear back in the next 2 weeks.   If you need additional refills before your next appointment, please call your pharmacy first.   Avanell Bob, MD  Family Medicine

## 2024-01-29 NOTE — Assessment & Plan Note (Signed)
 She remains off of anticoagulation due to history of GI bleed with known AVMs Denies chest pain or palpitations

## 2024-01-29 NOTE — Progress Notes (Signed)
    SUBJECTIVE:   CHIEF COMPLAINT / HPI:   Pressure ulcer- improving, on her right buttock. No skin breakdown, just light pink. Have hospital air mattress at home, position changes, Dessetin and neosporin on it. Unable to see unless she is lying flat and she is transferred with a Hoyer lift at home, but will send a picture.  Hospital follow up- Doing well since hospital. Eating, will mostly feed herself but sometimes needs help with Feeding. Using 3 ensure per day. She remains off of blood thinner, no blood in stool. She denies chest pain, shortness of breath, or palpitations.  Urinary incontinence- difficulty with mobilization to the bathroom so uses up to 5 pull ups per day, as well as chuck pads (4 per day), gloves and wipes. Frequent changes medically necessary to prevent skin breakdown with her history of pressure ulcer and immobility due to Parkinsons' disease.  Goals of care- she is following with OP palliative care. We discussed HCPOA, advance directives/living will, and code status with her today.   PERTINENT  PMH / PSH: Parkinsons' disease, anemia, carotid artery disease, hyperlipidemia, hypertension, hypothyroidism, mild CAD, paroxysmal atrial fibrillation, prediabetes, pulmonary emboli, right subclavian artery, vitamin B12 deficiency   OBJECTIVE:   BP 122/73   Pulse 74   SpO2 95%   General: A&O, NAD HEENT: No sign of trauma, EOM grossly intact Cardiac: RRR, no m/r/g Respiratory: CTAB, normal WOB, no w/c/r GI: Soft, NTTP, non-distended  Extremities: NTTP, no peripheral edema. Neuro: in wheelchair, muted voice, some rigidity noted in b/l upper extremities Psych: Appropriate mood and affect   ASSESSMENT/PLAN:   Assessment & Plan Anemia, unspecified type Repeat CBC today, no signs or symptoms of bleeding Paroxysmal atrial fibrillation (HCC) She remains off of anticoagulation due to history of GI bleed with known AVMs Denies chest pain or palpitations Other urinary  incontinence It is medically necessary for her to received 5 adult pulls up per day, 4 chuck pads per day, gloves and wipes to manage her urinary incontinence due to her Parkinson's disease. This is medically necessary to prevent bedsores/pressure ulcers which she has ahistory of, to maintain skin integrity and prevent skin breakdown. Protein-calorie malnutrition, severe (HCC) She requires 3 Ensures per day due to difficulty feeding with her Parkinson's disease to maintain adequate nutrition. Goals of care, counseling/discussion Discussed with patient as she is alert and oriented today Discussed code status- she is considering DNR but prefers to remain full code, they are discussing with palliative care as they follow outpatient as well, we discussed risks of CPR/ACLS today Also discussed HCPOA, living will/advances directives, daughter and husband present and asked questions, packet given to fill out and discussed we could notarize here in clinic Plan for home visit in 2 months when schedule for July is available- can book at 1130/1110 spot for home visit.      Charmel Cooter, MD [facility]

## 2024-01-30 ENCOUNTER — Other Ambulatory Visit: Payer: Self-pay | Admitting: Family Medicine

## 2024-01-30 ENCOUNTER — Ambulatory Visit: Payer: Self-pay | Admitting: Family Medicine

## 2024-01-30 ENCOUNTER — Telehealth: Payer: Self-pay

## 2024-01-30 DIAGNOSIS — E119 Type 2 diabetes mellitus without complications: Secondary | ICD-10-CM

## 2024-01-30 LAB — CBC
Hematocrit: 35.8 % (ref 34.0–46.6)
Hemoglobin: 11.5 g/dL (ref 11.1–15.9)
MCH: 26.7 pg (ref 26.6–33.0)
MCHC: 32.1 g/dL (ref 31.5–35.7)
MCV: 83 fL (ref 79–97)
Platelets: 328 10*3/uL (ref 150–450)
RBC: 4.3 x10E6/uL (ref 3.77–5.28)
RDW: 15.1 % (ref 11.7–15.4)
WBC: 4.8 10*3/uL (ref 3.4–10.8)

## 2024-01-30 NOTE — Telephone Encounter (Signed)
-----   Message from Charmel Cooter sent at 01/30/2024 12:10 PM EDT ----- Regarding: Handicap placard Hi team,   Pt's handicap placard that they left after the visit is completed and placed in RN box, if we can call patient to come get. I made a copy to be scanned and uploaded a photo to the media tab.  Thanks! Dr Drue Gerald

## 2024-01-30 NOTE — Progress Notes (Signed)
-  Urine albumin creatinine ratio.

## 2024-01-30 NOTE — Telephone Encounter (Signed)
 Called per Dr. Roise Cleaver request and spoke with patient's daughter, just to inform her that the handicap placard is ready for pick up.  Patient's daughter said that she will pick it up tomorrow 01/31/2024.  Christ Courier, CMA

## 2024-02-07 DIAGNOSIS — E119 Type 2 diabetes mellitus without complications: Secondary | ICD-10-CM | POA: Diagnosis not present

## 2024-02-08 LAB — SPECIMEN STATUS REPORT

## 2024-02-08 LAB — MICROALBUMIN / CREATININE URINE RATIO
Creatinine, Urine: 40.9 mg/dL
Microalb/Creat Ratio: 97 mg/g{creat} — ABNORMAL HIGH (ref 0–29)
Microalbumin, Urine: 39.7 ug/mL

## 2024-02-09 ENCOUNTER — Ambulatory Visit: Payer: Self-pay | Admitting: Family Medicine

## 2024-02-10 ENCOUNTER — Encounter: Payer: Self-pay | Admitting: Family Medicine

## 2024-02-26 ENCOUNTER — Other Ambulatory Visit: Payer: Self-pay | Admitting: Family Medicine

## 2024-02-26 DIAGNOSIS — K219 Gastro-esophageal reflux disease without esophagitis: Secondary | ICD-10-CM

## 2024-02-29 DIAGNOSIS — I469 Cardiac arrest, cause unspecified: Secondary | ICD-10-CM | POA: Diagnosis not present

## 2024-02-29 DIAGNOSIS — R404 Transient alteration of awareness: Secondary | ICD-10-CM | POA: Diagnosis not present

## 2024-03-01 ENCOUNTER — Telehealth: Payer: Self-pay

## 2024-03-01 NOTE — Telephone Encounter (Signed)
 Called and spoke with daughter. Death certificate completed. Provided listening and thanked her for letting me take care of her family.   Avanell Bob MD

## 2024-03-01 NOTE — Telephone Encounter (Signed)
 Azucena Bollard, patients daughter, calls nurse requesting to speak with PCP.   She reports she missed a call from her earlier today. She reports her mother died yesterday.   She reports EMS was supposed to give PCP time of death information for the death certificate.   Azucena Bollard wants to make sure PCP has all the information needed.   Will forward to PCP.

## 2024-03-18 ENCOUNTER — Encounter

## 2024-03-19 DEATH — deceased

## 2024-05-02 ENCOUNTER — Other Ambulatory Visit: Payer: Self-pay | Admitting: Neurology

## 2024-05-02 DIAGNOSIS — G214 Vascular parkinsonism: Secondary | ICD-10-CM

## 2024-05-09 ENCOUNTER — Other Ambulatory Visit: Payer: Self-pay | Admitting: Family Medicine

## 2024-05-19 ENCOUNTER — Other Ambulatory Visit: Payer: Self-pay | Admitting: Cardiology

## 2024-05-30 ENCOUNTER — Ambulatory Visit: Payer: Medicare Other | Admitting: Neurology
